# Patient Record
Sex: Male | Born: 1950
Health system: Southern US, Community
[De-identification: ages and names within clinical notes are randomized; demographics above are authoritative.]

## PROBLEM LIST (undated history)

## (undated) DIAGNOSIS — G20 Parkinson's disease: Secondary | ICD-10-CM

## (undated) DIAGNOSIS — Z9889 Other specified postprocedural states: Secondary | ICD-10-CM

## (undated) DIAGNOSIS — M1710 Unilateral primary osteoarthritis, unspecified knee: Secondary | ICD-10-CM

## (undated) DIAGNOSIS — Z87442 Personal history of urinary calculi: Secondary | ICD-10-CM

## (undated) DIAGNOSIS — I499 Cardiac arrhythmia, unspecified: Secondary | ICD-10-CM

## (undated) DIAGNOSIS — G20A1 Parkinson's disease without dyskinesia, without mention of fluctuations: Secondary | ICD-10-CM

## (undated) DIAGNOSIS — J302 Other seasonal allergic rhinitis: Secondary | ICD-10-CM

## (undated) DIAGNOSIS — E7800 Pure hypercholesterolemia, unspecified: Secondary | ICD-10-CM

## (undated) DIAGNOSIS — M1990 Unspecified osteoarthritis, unspecified site: Secondary | ICD-10-CM

## (undated) DIAGNOSIS — I4891 Unspecified atrial fibrillation: Secondary | ICD-10-CM

## (undated) DIAGNOSIS — J189 Pneumonia, unspecified organism: Secondary | ICD-10-CM

## (undated) DIAGNOSIS — R112 Nausea with vomiting, unspecified: Secondary | ICD-10-CM

## (undated) DIAGNOSIS — M5430 Sciatica, unspecified side: Secondary | ICD-10-CM

## (undated) DIAGNOSIS — B070 Plantar wart: Secondary | ICD-10-CM

## (undated) DIAGNOSIS — I82409 Acute embolism and thrombosis of unspecified deep veins of unspecified lower extremity: Secondary | ICD-10-CM

## (undated) DIAGNOSIS — G459 Transient cerebral ischemic attack, unspecified: Secondary | ICD-10-CM

## (undated) DIAGNOSIS — E878 Other disorders of electrolyte and fluid balance, not elsewhere classified: Secondary | ICD-10-CM

## (undated) DIAGNOSIS — N200 Calculus of kidney: Secondary | ICD-10-CM

## (undated) DIAGNOSIS — I2699 Other pulmonary embolism without acute cor pulmonale: Secondary | ICD-10-CM

## (undated) DIAGNOSIS — I639 Cerebral infarction, unspecified: Secondary | ICD-10-CM

## (undated) DIAGNOSIS — M47816 Spondylosis without myelopathy or radiculopathy, lumbar region: Secondary | ICD-10-CM

## (undated) DIAGNOSIS — I10 Essential (primary) hypertension: Secondary | ICD-10-CM

## (undated) DIAGNOSIS — T7840XA Allergy, unspecified, initial encounter: Secondary | ICD-10-CM

## (undated) HISTORY — DX: Unspecified osteoarthritis, unspecified site: M19.90

## (undated) HISTORY — DX: Calculus of kidney: N20.0

## (undated) HISTORY — PX: TONSILLECTOMY: SUR1361

## (undated) HISTORY — PX: KNEE SURGERY: SHX244

## (undated) HISTORY — PX: PAIN PUMP IMPLANTATION: SHX330

## (undated) HISTORY — DX: Plantar wart: B07.0

## (undated) HISTORY — DX: Spondylosis without myelopathy or radiculopathy, lumbar region: M47.816

## (undated) HISTORY — DX: Parkinson's disease: G20

## (undated) HISTORY — DX: Other disorders of electrolyte and fluid balance, not elsewhere classified: E87.8

## (undated) HISTORY — DX: Sciatica, unspecified side: M54.30

## (undated) HISTORY — DX: Essential (primary) hypertension: I10

## (undated) HISTORY — DX: Acute embolism and thrombosis of unspecified deep veins of unspecified lower extremity: I82.409

## (undated) HISTORY — PX: OTHER SURGICAL HISTORY: SHX169

## (undated) HISTORY — PX: EYE SURGERY: SHX253

## (undated) HISTORY — DX: Unspecified atrial fibrillation: I48.91

## (undated) HISTORY — PX: APPENDECTOMY: SHX54

## (undated) HISTORY — DX: Pure hypercholesterolemia, unspecified: E78.00

## (undated) HISTORY — PX: CATARACT EXTRACTION: SUR2

## (undated) HISTORY — DX: Parkinson's disease without dyskinesia, without mention of fluctuations: G20.A1

## (undated) HISTORY — DX: Other seasonal allergic rhinitis: J30.2

## (undated) HISTORY — DX: Transient cerebral ischemic attack, unspecified: G45.9

## (undated) HISTORY — DX: Allergy, unspecified, initial encounter: T78.40XA

## (undated) HISTORY — DX: Other pulmonary embolism without acute cor pulmonale: I26.99

## (undated) HISTORY — DX: Unilateral primary osteoarthritis, unspecified knee: M17.10

---

## 2007-04-15 HISTORY — PX: KNEE ARTHROSCOPY WITH MENISCAL REPAIR: SHX5653

## 2014-05-25 ENCOUNTER — Encounter: Payer: Self-pay | Admitting: Neurology

## 2014-06-06 ENCOUNTER — Ambulatory Visit: Payer: Self-pay | Admitting: Hematology and Oncology

## 2014-06-13 ENCOUNTER — Ambulatory Visit
Admit: 2014-06-13 | Disposition: A | Discharge status: Home / Self Care | Payer: Self-pay | Attending: Hematology and Oncology | Admitting: Hematology and Oncology

## 2014-06-21 ENCOUNTER — Encounter: Payer: Self-pay | Admitting: Neurology

## 2014-06-26 ENCOUNTER — Encounter: Payer: Self-pay | Admitting: Neurology

## 2014-06-26 ENCOUNTER — Ambulatory Visit (INDEPENDENT_AMBULATORY_CARE_PROVIDER_SITE_OTHER): Payer: Medicare PPO | Admitting: Neurology

## 2014-06-26 ENCOUNTER — Telehealth: Payer: Self-pay | Admitting: Neurology

## 2014-06-26 VITALS — BP 128/72 | HR 68 | Ht 70.0 in | Wt 259.0 lb

## 2014-06-26 DIAGNOSIS — G20 Parkinson's disease: Secondary | ICD-10-CM

## 2014-06-26 DIAGNOSIS — R292 Abnormal reflex: Secondary | ICD-10-CM

## 2014-06-26 DIAGNOSIS — M542 Cervicalgia: Secondary | ICD-10-CM

## 2014-06-26 DIAGNOSIS — K5901 Slow transit constipation: Secondary | ICD-10-CM

## 2014-06-26 MED ORDER — ROPINIROLE HCL 2 MG PO TABS: 2.0000 mg | ORAL_TABLET | Freq: Three times a day (TID) | ORAL | Status: DC

## 2014-06-26 MED ORDER — DIAZEPAM 5 MG PO TABS: ORAL_TABLET | ORAL | Status: DC

## 2014-06-26 MED ORDER — CARBIDOPA-LEVODOPA 25-100 MG PO TABS: 1.5000 | ORAL_TABLET | Freq: Three times a day (TID) | ORAL | Status: DC

## 2014-06-26 NOTE — Patient Instructions (Signed)
1.  After you run out of your current supply, we can change your ropinirole to the non extended release and you will take 2 mg three times a day 2.  Increase carbidopa/levodopa 25/100 to 1.5 tablets at 6am/11am/5pm 3.  Stop azilect once you run out of it. 4.  We will get prior authorization for your MRI's (brain and cervical) 5.  Go [place]!!! 6.  Constipation and Parkinson's disease:  Rancho recipe for constipation in Parkinsons Disease:  -1 cup of bran, 2 cups of applesauce in 1 cup of prune juice   Increase fiber intake (Metamucil,vegetables) Regular, moderate exercise can be beneficial.  Avoid medications causing constipation, such as medications like antacids with calcium or magnesium Laxative overuse should be avoided. Stool softeners (Colace) can help with chronic constipation.

## 2014-06-26 NOTE — Progress Notes (Signed)
Thomas [name] was seen today in the movement disorders clinic for neurologic consultation at the request of [name], DO.  The consultation is for the evaluation of PD.  He is accompanied by his wife who supplements the history.  The patient has seen [name] at [facility] and I do have those notes.  The patient was dx with PD in Oct 2013 but first sx's probably started in the summer of 2013 with slowness of movement, R greater than L.  He does state that non-motor sx's like constipation occurred several years prior to this.  He was in [place] at the time.  He was initially started on Azilect in October 2013 and about 6 months later requip was added.  He was started initially at requip XL 2 mg and has worked up to requip XL 6 mg (the last increase was 10/2013).  He noticed a benefit with the requip but the XL is very expensive and asks about that.  He initially had some mental foginess with requip but that is better. Also asks about azilect as hasn't seen a benefit and is costing him $300.  Levodopa was then added by Dr. [name] in 03/2014.  It helped with cramping, rigidity.  He takes it at 4 am (but then goes back to sleep until 6am), 12pm, 8pm (bed at 8:30pm). He exercises faithfully.  He has neck turning to the right and has since the dx.  Some pain.     Specific Other Symptoms:  Tremor: Yes.  , only with pushing/pulling something or with stress Voice: weaker than in the past Sleep: sleeps well  Vivid Dreams:  Yes.    Acting out dreams:  Yes.   Wet Pillows: Yes.   (occasionally) Postural symptoms:  No. (unless gets up quick)  Falls?  No. Bradykinesia symptoms: slow movements; able to get up better now that on levodopa Loss of smell:  yes Loss of taste:  No. Urinary Incontinence: minimal - better with levodopa Difficulty Swallowing:  Yes.   but better now (would choke on saliva) Handwriting, micrographia: Yes.   (R hand dominant) Trouble with ADL's:  No. (shaving is easier now  that on levodopa; wife helps put on belt)  Trouble buttoning clothing: No. Depression:  No. Memory changes:  Yes.   but minor Hallucinations:  No.  visual distortions:rare N/V:  No. Lightheaded:  No.  Syncope: No. Diplopia:  No. Dyskinesia:  No.  Neuroimaging has previously been performed.  It is not available for my review today.  This was done in [place] prior to the diagnosis.  The patient does state that he had a TIA in 2007.  He was working in the yard on a hot day and he had just gone in and he sat down and felt a pain in the right side and tinnitus in the right ear and he had R face droop on the way to the hospital and R arm was "curling" on the way to the hospital.  He states that he was out of the window for TPA but was given IV heparin but was back to normal by 4 am (started at 5:30 pm but was at hospital by 6:30 pm so was unclear why out of TPA window).    PREVIOUS MEDICATIONS: Sinemet, Requip and azilect  ALLERGIES:   Allergies  Allergen Reactions  . Codeine Other (See Comments)    CURRENT MEDICATIONS:  Outpatient Encounter Prescriptions as of 06/26/2014  Medication Sig  . aspirin EC 81  MG tablet Take by mouth.  . carbidopa-levodopa (SINEMET IR) 25-100 MG per tablet Take 1 tablet by mouth 3 (three) times daily.   . cetirizine (ZYRTEC) 10 MG tablet Take 10 mg by mouth daily.   . hydrochlorothiazide (MICROZIDE) 12.5 MG capsule Take 12.5 mg by mouth daily.   Marland Kitchen LEVITRA 20 MG tablet Take 20 mg by mouth as needed.   Marland Kitchen lisinopril (PRINIVIL,ZESTRIL) 20 MG tablet Take 20 mg by mouth daily.   . rasagiline (AZILECT) 1 MG TABS tablet Take 1 mg by mouth daily.   . rivaroxaban (XARELTO) 20 MG TABS tablet Take 20 mg by mouth daily with supper.   . Ropinirole HCl 6 MG TB24 Take 6 mg by mouth at bedtime.   . simvastatin (ZOCOR) 40 MG tablet Take 40 mg by mouth daily.   . [DISCONTINUED] vardenafil (LEVITRA) 5 MG tablet Take by mouth.    PAST MEDICAL HISTORY:   Past Medical  History  Diagnosis Date  . PD (Parkinson's disease)   . Hyperchloremia   . Hypertension   . Plantar wart   . Arthritis of knee   . H/O blood clots   . Seasonal allergies   . TIA (transient ischemic attack)   . Nephrolithiasis     PAST SURGICAL HISTORY:   Past Surgical History  Procedure Laterality Date  . Knee surgery Right   . Appendectomy    . Tonsillectomy      SOCIAL HISTORY:   History   Social History  . Marital Status: Married    Spouse Name: N/A  . Number of Children: N/A  . Years of Education: N/A   Occupational History  . 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 Smoking status: Never Smoker   . Smokeless tobacco: Not on file  . Alcohol Use: No  . Drug Use: No  . Sexual Activity: Not on file   Other Topics Concern  . Not on file   Social History Narrative  . No narrative on file    FAMILY HISTORY:   Family Status  Relation Status Death Age  . Father Deceased     PD  . Mother Deceased     CHF  . Sister Deceased     CHF  . Sister Deceased     breast cancer  . Sister Alive     heart disease    ROS:  A complete 10 system review of systems was obtained and was unremarkable apart from what is mentioned above.  PHYSICAL EXAMINATION:    VITALS:   Filed Vitals:   06/26/14 1006  BP: 128/72  Pulse: 68  Height: 5\' 10"  (1.778 m)  Weight: 259 lb (117.482 kg)    GEN:  The patient appears stated age and is in NAD. HEENT:  Normocephalic, atraumatic.  The mucous membranes are moist. The superficial temporal arteries are without ropiness or tenderness. CV:  RRR Lungs:  CTAB Neck/HEME:  There are no carotid bruits bilaterally.  The neck is slightly turned into sidebending to the right.  Neurological examination:  Orientation: The patient is alert and oriented x3. Fund of knowledge is appropriate.  Recent and remote memory are intact.  Attention and concentration are normal.    Able to name objects and repeat phrases. Cranial  nerves: There is good facial symmetry. Pupils are equal round and reactive to light bilaterally. Fundoscopic exam reveals clear margins bilaterally. Extraocular muscles are intact. The visual fields are full to confrontational testing.  The speech is fluent and clear. Soft palate rises symmetrically and there is no tongue deviation. Hearing is intact to conversational tone. Sensation: Sensation is intact to light and pinprick throughout (facial, trunk, extremities). Vibration is intact at the bilateral big toe. There is no extinction with double simultaneous stimulation. There is no sensory dermatomal level identified. Motor: Strength is 5/5 in the bilateral upper and lower extremities.   Shoulder shrug is equal and symmetric.  There is no pronator drift. Deep tendon reflexes: Deep tendon reflexes are 3/4 at the bilateral biceps, triceps, brachioradialis, 3+ patella and achilles.  There are a few beats of nonsustained ankle clonus.  Plantar responses are downgoing bilaterally.  Movement examination: Tone: There is moderate increased tone in the left upper extremity and mild increased tone in the right upper extremity.   Abnormal movements: None Coordination:  There is decremation with RAM's, seen with finger taps on the right and heel taps/toe taps on the left. Gait and Station: The patient has no difficulty arising out of a deep-seated chair without the use of the hands. The patient's stride length is normal with slightly decreased arm swing.  The patient has a negative pull test.      ASSESSMENT/PLAN:  1.  Idiopathic Parkinson's disease, diagnosed in October 2013 in [place] with nonmotor symptoms that preceded this for several years.  Father had PD as well.  -I will change his Requip XL from 6 mg daily to ropinirole 2 mg 3 times a day because of cost.  -Increased carbidopa/levodopa 25/100-1-1/2 tablets 3 times per day and change the dosing times to 6 AM/11 AM/5 PM.  Risks, benefits, side effects  and alternative therapies were discussed.  The opportunity to ask questions was given and they were answered to the best of my ability.  The patient expressed understanding and willingness to follow the outlined treatment protocols.  -Talked about the importance of safe, cardiovascular exercise.  Spent much greater than 50% of the 80 minute visit in counseling.  Talked about what to expect now as well as in the future.  Talked about local support groups.  He is going to be moving soon, so we decided to hold off on the referral to the neuro rehabilitation center.  -The patient would like to discontinue the Azilect, primarily because it is causing a huge financial strain.  We talked about risks and benefits of this, and I have no objection to him stopping it. 2.  Constipation, associated with Parkinson's disease.  -Patient was given a copy of the rancho recipe. 3.  Hyperreflexia.  -This could be associated with Parkinson's disease and the patient had a history of a prior TIA, but given his neck pain and dystonia (also could be associated with Parkinson's), I think we should go ahead and get an MRI of the cervical spine and brain. 4.  REM behavior disorder  -not on meds.  Monitoring. 5.  F/u in next 3 months, sooner should new neuro issues arise.

## 2014-06-26 NOTE — Progress Notes (Signed)
Note faxed to Dr [name] at [phone] with confirmation received.

## 2014-06-26 NOTE — Telephone Encounter (Signed)
Prescriptions sent to patient's pharmacy.

## 2014-06-26 NOTE — Telephone Encounter (Signed)
Krist, pt's spouse called to give the following info. Pharmacy: 90 day program -[place] # [phone] C/b [phone]

## 2014-06-27 ENCOUNTER — Ambulatory Visit
Admission: RE | Admit: 2014-06-27 | Discharge: 2014-06-27 | Disposition: A | Discharge status: Home / Self Care | Payer: Medicare PPO | Source: Ambulatory Visit | Attending: Neurology | Admitting: Neurology

## 2014-07-14 ENCOUNTER — Ambulatory Visit
Admit: 2014-07-14 | Disposition: A | Discharge status: Home / Self Care | Payer: Self-pay | Attending: Hematology and Oncology | Admitting: Hematology and Oncology

## 2014-07-14 ENCOUNTER — Telehealth: Payer: Self-pay | Admitting: Neurology

## 2014-07-14 NOTE — Telephone Encounter (Signed)
MR cancelled with [facility] per patient's request.

## 2014-07-14 NOTE — Telephone Encounter (Signed)
Steffanie Dunn, pt's spouse called wanting to cancel pt's MRI for 07/17/14. She stated that pt can not afford the cost right now. C/b [phone]

## 2014-07-17 ENCOUNTER — Other Ambulatory Visit: Payer: Medicare PPO

## 2014-07-17 ENCOUNTER — Inpatient Hospital Stay: Admission: RE | Admit: 2014-07-17 | Payer: Medicare PPO | Source: Ambulatory Visit

## 2014-09-20 ENCOUNTER — Other Ambulatory Visit: Payer: Self-pay

## 2014-09-20 DIAGNOSIS — I82409 Acute embolism and thrombosis of unspecified deep veins of unspecified lower extremity: Secondary | ICD-10-CM

## 2014-09-25 ENCOUNTER — Telehealth: Payer: Self-pay

## 2014-09-25 DIAGNOSIS — R972 Elevated prostate specific antigen [PSA]: Secondary | ICD-10-CM

## 2014-09-25 NOTE — Telephone Encounter (Signed)
Patients wife called and wanted to know if he could come in tomorrow for PSA, I notified wife that it was fine for him to come in and have this drawn, I scheduled him for an appointment tomorrow.

## 2014-09-26 ENCOUNTER — Other Ambulatory Visit: Payer: Medicare PPO

## 2014-09-26 DIAGNOSIS — R972 Elevated prostate specific antigen [PSA]: Secondary | ICD-10-CM

## 2014-09-27 ENCOUNTER — Telehealth: Payer: Self-pay | Admitting: Family Medicine

## 2014-09-27 LAB — PSA: PROSTATE SPECIFIC AG, SERUM: 2.3 ng/mL (ref 0.0–4.0)

## 2014-09-27 NOTE — Telephone Encounter (Signed)
Called and gave Thomas [name] the results that his PSA is back to normal.

## 2014-09-29 ENCOUNTER — Inpatient Hospital Stay: Payer: Medicare PPO

## 2014-09-29 ENCOUNTER — Inpatient Hospital Stay: Payer: Medicare PPO | Attending: Hematology and Oncology | Admitting: Hematology and Oncology

## 2014-09-29 ENCOUNTER — Encounter: Payer: Self-pay | Admitting: Hematology and Oncology

## 2014-09-29 VITALS — BP 127/82 | HR 60 | Temp 97.2°F | Resp 16 | Wt 258.4 lb

## 2014-09-29 DIAGNOSIS — G20 Parkinson's disease: Secondary | ICD-10-CM | POA: Diagnosis not present

## 2014-09-29 DIAGNOSIS — Z8673 Personal history of transient ischemic attack (TIA), and cerebral infarction without residual deficits: Secondary | ICD-10-CM | POA: Insufficient documentation

## 2014-09-29 DIAGNOSIS — E780 Pure hypercholesterolemia: Secondary | ICD-10-CM | POA: Insufficient documentation

## 2014-09-29 DIAGNOSIS — I10 Essential (primary) hypertension: Secondary | ICD-10-CM | POA: Insufficient documentation

## 2014-09-29 DIAGNOSIS — I2699 Other pulmonary embolism without acute cor pulmonale: Secondary | ICD-10-CM | POA: Insufficient documentation

## 2014-09-29 DIAGNOSIS — I82402 Acute embolism and thrombosis of unspecified deep veins of left lower extremity: Secondary | ICD-10-CM

## 2014-09-29 DIAGNOSIS — I82409 Acute embolism and thrombosis of unspecified deep veins of unspecified lower extremity: Secondary | ICD-10-CM | POA: Diagnosis not present

## 2014-09-29 DIAGNOSIS — Z7901 Long term (current) use of anticoagulants: Secondary | ICD-10-CM

## 2014-09-30 ENCOUNTER — Encounter: Payer: Self-pay | Admitting: Hematology and Oncology

## 2014-09-30 NOTE — Progress Notes (Signed)
[facility] day:  09/30/2014  Chief Complaint: Thomas [name] is a 64 y.o. male with a distant history of cerebrovascular accident and a recent history of deep venous thrombosis and pulmonary embolism who is seen for 3 month assessment on Xarelto.  HPI: The patient was last seen in the medical oncology clinic on 06/29/2014.  At that time, he was seen for initial assessment by me. He had been on Xarelto since 11/2013. He denied any bruising or bleeding. Symptomatically, he felt well. He denied any weight loss. He had a normal colonoscopy in the past 10 years. PSA was normal in July 2015. Hypercoagulable workup was negative. Exam was unremarkable.  Left lower extremity duplex on 06/23/2014 revealed some partially occlusive thrombus of the left posterior tibial, femoral veins.   Outside labs from 05/19/2014 had revealed elevated d-dimer indicating active clot breakdown.  The patient states he has a new neurologist in [place]. His medications have been adjusted. He now feels better than he has in the past year and a half. He is active and goes to [employer] and does cardio 4 times a week. He moved into a new house.  He has to climb 14 stairs a day.  Past Medical History  Diagnosis Date  . DVT (deep venous thrombosis)   . Parkinson's disease   . Pulmonary embolism   . Hypercholesterolemia   . Hypertension     No past surgical history on file.  Family History  Problem Relation Age of Onset  . Coronary artery disease Mother   . Parkinson's disease Father   . Heart attack Sister   . Deep vein thrombosis Sister     Social History:  reports that he has never smoked. He does not have any smokeless tobacco history on file. His alcohol and drug histories are not on file.  The patient is accompanied by his wife, [name], today.  Allergies:  Allergies  Allergen Reactions  . Codeine     hallucinations   Current Medications: No current outpatient  prescriptions on file.   No current facility-administered medications for this visit.   Review of Systems:  GENERAL:  Feels good.  Much more active.  No fevers, sweats or weight loss. PERFORMANCE STATUS (ECOG):  1-2 HEENT:  No visual changes, runny nose, sore throat, mouth sores or tenderness. Lungs: No shortness of breath or cough.  No hemoptysis. Cardiac:  No chest pain, palpitations, orthopnea, or PND. GI:  No nausea, vomiting, diarrhea, constipation, melena or hematochezia. GU:  No urgency, frequency, dysuria, or hematuria. Musculoskeletal:  Knee problem (chronic).  No back pain.  No muscle tenderness. Extremities:  No pain or swelling. Skin:  Plantar's warst. No rashes or skin changes. Neuro:  No headache, numbness or weakness, balance or coordination issues. Endocrine:  No diabetes, thyroid issues, hot flashes or night sweats. Psych:  No mood changes, depression or anxiety. Pain:  No focal pain. Review of systems:  All other systems reviewed and found to be negative.  Physical Exam: Blood pressure 127/82, pulse 60, temperature 97.2 F (36.2 C), temperature source Tympanic, resp. rate 16, weight 258 lb 6.1 oz (117.2 kg), SpO2 98 %. GENERAL:  Well developed, well nourished, sitting comfortably in the exam room in no acute distress. MENTAL STATUS:  Alert and oriented to person, place and time. HEAD:  Normocephalic, atraumatic, face symmetric, no Cushingoid features. EYES:  Blue eyes.  Pupils equal round and reactive to light and accomodation.  No conjunctivitis or  scleral icterus. ENT:  Oropharynx clear without lesion.  Tongue normal. Mucous membranes moist.  RESPIRATORY:  Clear to auscultation without rales, wheezes or rhonchi. CARDIOVASCULAR:  Regular rate and rhythm without murmur, rub or gallop. ABDOMEN:  Soft, non-tender, with active bowel sounds, and no hepatosplenomegaly.  No masses. SKIN:  No rashes, ulcers or lesions. EXTREMITIES: Chronic left lower extremity edema.  No  skin discoloration or tenderness.  No palpable cords. LYMPH NODES: No palpable cervical, supraclavicular, axillary or inguinal adenopathy  NEUROLOGICAL: Unremarkable. PSYCH:  Appropriate.   Outside Labs:  CBC on 08/22/2014 have lab or revealed a hematocrit of 41, hemoglobin 14.1, MCV 88, platelets 17,000, white count 5600 with an [place] of 3700.  Comprehensive metabolic panel was normal with a creatinine of 1.  PSA was 7.5 (high) with a repeat value of 2.3 on 09/26/2014.  Assessment:  Derrious Bologna is a 64 y.o. male with a history of a cerebral vascular accident in 2007 and a history of deep venous thrombosis and pulmonary embolism on 12/07/2013.  There were no precipitating events to his PE and DVT.  He was placed on Xarelto.  Laboratory testing on 05/19/2014 revealed negative Factor V Leiden, beta-2 glycoprotein, and anticardiolipin antibodies. Protein S activity and antigen, and antithrombin III activity were normal.  Additional testing on 06/14/2014 revealed a normal CBC, CMP, protein C antigen and activity, prothrombin gene mutation, and lupus anticoagulant panel.  D-dimers were elevated indicating active clot breakdown.  Lower extremity duplex on 06/23/2014 revealed persistent partially occlusive thrombus in the left posterior tibial, popliteal, and mid femoral veins.  Chest CT on 06/23/2014 revealed no pulmonary nodules or adenoopathy.  He has been on Xarelto since 11/2013.  He denies any bruising or bleeding.   Symptomatically,  he feels well. He has been more active with a change in his Parkinson's medications.  PSA was elevated on 08/22/2014 then normal on 09/26/2014.  Exam is unremarkable.  Plan: 1. Discuss negative hypercoagulable workup. Discuss initial concern regarding Parkinson's disease and activity level. Patient now appears more active. Discuss plan to repeat ultrasound and D dimers and at that point decide about length of anticoagulation. Discuss if patient develops a clot off of  anticoagulation, he would require life long anticoagulation. 2. Slip for  3. [facility] labs at next visit. 4. Continue Xarelto.  5. Scheduled lower extremity duplex in 3 months 6. Return to clinic in 3 months for M.D. assessment, review of outside labs and recent ultrasound.  Lequita Asal, MD  09/29/2014, 4:10 PM

## 2014-10-02 ENCOUNTER — Encounter: Payer: Self-pay | Admitting: Hematology and Oncology

## 2014-10-02 NOTE — Addendum Note (Signed)
Addended by: Vivia Birmingham on: 10/02/2014 09:04 AM   Modules accepted: Medications

## 2014-10-02 NOTE — Addendum Note (Signed)
Addended by: Vivia Birmingham on: 10/02/2014 09:48 AM   Modules accepted: Medications

## 2014-10-26 ENCOUNTER — Encounter: Payer: Self-pay | Admitting: Neurology

## 2014-10-26 ENCOUNTER — Ambulatory Visit (INDEPENDENT_AMBULATORY_CARE_PROVIDER_SITE_OTHER): Payer: Medicare PPO | Admitting: Neurology

## 2014-10-26 VITALS — BP 110/72 | HR 68 | Ht 70.0 in | Wt 260.0 lb

## 2014-10-26 DIAGNOSIS — G4752 REM sleep behavior disorder: Secondary | ICD-10-CM

## 2014-10-26 DIAGNOSIS — G20 Parkinson's disease: Secondary | ICD-10-CM

## 2014-10-26 NOTE — Progress Notes (Signed)
Thomas [name] was seen today in the movement disorders clinic for neurologic consultation at the request of Park Liter, DO.  The consultation is for the evaluation of PD.  He is accompanied by his wife who supplements the history.  The patient has seen [name] at [facility] and I do have those notes.  The patient was dx with PD in Oct 2013 but first sx's probably started in the summer of 2013 with slowness of movement, R greater than L.  He does state that non-motor sx's like constipation occurred several years prior to this.  He was in [place] at the time.  He was initially started on Azilect in October 2013 and about 6 months later requip was added.  He was started initially at requip XL 2 mg and has worked up to requip XL 6 mg (the last increase was 10/2013).  He noticed a benefit with the requip but the XL is very expensive and asks about that.  He initially had some mental foginess with requip but that is better. Also asks about azilect as hasn't seen a benefit and is costing him $300.  Levodopa was then added by Dr. [name] in 03/2014.  It helped with cramping, rigidity.  He takes it at 4 am (but then goes back to sleep until 6am), 12pm, 8pm (bed at 8:30pm). He exercises faithfully.  He has neck turning to the right and has since the dx.  Some pain.     Specific Other Symptoms:  Tremor: Yes.  , only with pushing/pulling something or with stress Voice: weaker than in the past Sleep: sleeps well  Vivid Dreams:  Yes.    Acting out dreams:  Yes.   Wet Pillows: Yes.   (occasionally) Postural symptoms:  No. (unless gets up quick)  Falls?  No. Bradykinesia symptoms: slow movements; able to get up better now that on levodopa Loss of smell:  yes Loss of taste:  No. Urinary Incontinence: minimal - better with levodopa Difficulty Swallowing:  Yes.   but better now (would choke on saliva) Handwriting, micrographia: Yes.   (R hand dominant) Trouble with ADL's:  No. (shaving is easier now that  on levodopa; wife helps put on belt)  Trouble buttoning clothing: No. Depression:  No. Memory changes:  Yes.   but minor Hallucinations:  No.  visual distortions:rare N/V:  No. Lightheaded:  No.  Syncope: No. Diplopia:  No. Dyskinesia:  No.  Neuroimaging has previously been performed.  It is not available for my review today.  This was done in [place] prior to the diagnosis.  The patient does state that he had a TIA in 2007.  He was working in the yard on a hot day and he had just gone in and he sat down and felt a pain in the right side and tinnitus in the right ear and he had R face droop on the way to the hospital and R arm was "curling" on the way to the hospital.  He states that he was out of the window for TPA but was given IV heparin but was back to normal by 4 am (started at 5:30 pm but was at hospital by 6:30 pm so was unclear why out of TPA window).    10/26/14 update:  The patient returns today for follow-up, accompanied by his wife who supplements the history.  Last visit, changed him from Requip XL, 6 mg daily to ropinirole 2 mg 3 times per day, solely because of cost.  His Azilect was also discontinued because of cost.  His levodopa was increased to carbidopa/levodopa 25/100, 1-1/2 tablets 3 times per day.  I planned to order an MRI of the brain and cervical spine, but this was canceled by the patient because of cost.  Today, the patient states that he feels better overall and his gait is better; he no longer feels "foggy."  He is exercising.  He is doing cardio at [employer] and doing weights and cardio.  No falls.  Occ lightheadedness.  No syncope.  No hallucinations.  Very vivid dreams.  Is talking in sleep.  No hitting wife at night.  Not falling out bed.  "I feel better than I have in years."  PREVIOUS MEDICATIONS: Sinemet, Requip and azilect  ALLERGIES:   Allergies  Allergen Reactions  . Codeine Other (See Comments)    CURRENT MEDICATIONS:  Outpatient Encounter  Prescriptions as of 10/26/2014  Medication Sig  . aspirin EC 81 MG tablet Take by mouth.  . carbidopa-levodopa (SINEMET IR) 25-100 MG per tablet Take 1.5 tablets by mouth 3 (three) times daily.  . hydrochlorothiazide (MICROZIDE) 12.5 MG capsule Take 12.5 mg by mouth daily.   Marland Kitchen LEVITRA 20 MG tablet Take 20 mg by mouth as needed.   Marland Kitchen lisinopril (PRINIVIL,ZESTRIL) 20 MG tablet Take 30 mg by mouth daily.   . rivaroxaban (XARELTO) 20 MG TABS tablet Take 20 mg by mouth daily with supper.   Marland Kitchen rOPINIRole (REQUIP) 2 MG tablet Take 1 tablet (2 mg total) by mouth 3 (three) times daily.  . simvastatin (ZOCOR) 40 MG tablet Take 40 mg by mouth daily.   . [DISCONTINUED] cetirizine (ZYRTEC) 10 MG tablet Take 10 mg by mouth daily.   . [DISCONTINUED] diazepam (VALIUM) 5 MG tablet Take 1 tablet 30 minutes prior to MRI may repeat if needed  . [DISCONTINUED] rasagiline (AZILECT) 1 MG TABS tablet Take 1 mg by mouth daily.    No facility-administered encounter medications on file as of 10/26/2014.    PAST MEDICAL HISTORY:   Past Medical History  Diagnosis Date  . PD (Parkinson's disease)   . Hyperchloremia   . Hypertension   . Plantar wart   . Arthritis of knee   . DVT (deep venous thrombosis)     L leg  . Seasonal allergies   . TIA (transient ischemic attack)   . Nephrolithiasis   . Pulmonary embolism     PAST SURGICAL HISTORY:   Past Surgical History  Procedure Laterality Date  . Knee surgery Right   . Appendectomy    . Tonsillectomy      SOCIAL HISTORY:   History   Social History  . Marital Status: Married    Spouse Name: N/A  . Number of Children: N/A  . Years of Education: N/A   Occupational History  . 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 Smoking status: Never Smoker   . Smokeless tobacco: Not on file  . Alcohol Use: No  . Drug Use: No  . Sexual Activity: Not on file   Other Topics Concern  . Not on file   Social History Narrative    FAMILY HISTORY:     Family Status  Relation Status Death Age  . Father Deceased     PD  . Mother Deceased     CHF  . Sister Deceased     CHF  . Sister Deceased     breast cancer  . Sister  Alive     heart disease    ROS:  A complete 10 system review of systems was obtained and was unremarkable apart from what is mentioned above.  PHYSICAL EXAMINATION:    VITALS:   Filed Vitals:   10/26/14 1051  BP: 110/72  Pulse: 68  Height: 5\' 10"  (1.778 m)  Weight: 260 lb (117.935 kg)    GEN:  The patient appears stated age and is in NAD. HEENT:  Normocephalic, atraumatic.  The mucous membranes are moist. The superficial temporal arteries are without ropiness or tenderness. CV:  RRR Lungs:  CTAB Neck/HEME:  There are no carotid bruits bilaterally.  The neck is slightly turned into sidebending to the right.  Neurological examination:  Orientation: The patient is alert and oriented x3. Fund of knowledge is appropriate.  Recent and remote memory are intact.  Attention and concentration are normal.    Able to name objects and repeat phrases. Cranial nerves: There is good facial symmetry.  Extraocular muscles are intact. The visual fields are full to confrontational testing. The speech is fluent and clear. Soft palate rises symmetrically and there is no tongue deviation. Hearing is intact to conversational tone. Sensation: Sensation is intact to light touch throughout Motor: Strength is 5/5 in the bilateral upper and lower extremities.   Shoulder shrug is equal and symmetric.  There is no pronator drift.   Movement examination: Tone: There is mild increased tone in the LUE and normal on the right Abnormal movements: None Coordination:  There is minimal decremation with RAM's, seen with finger taps on the right and heel taps/toe taps on the left. Gait and Station: The patient has no difficulty arising out of a deep-seated chair without the use of the hands. The patient's stride length is normal with slightly  decreased arm swing.  The patient has a negative pull test.      ASSESSMENT/PLAN:  1.  Idiopathic Parkinson's disease, diagnosed in October 2013 in [place] with nonmotor symptoms that preceded this for several years.  Father had PD as well.  -continue ropinirole 2 mg 3 times a day because of cost.  -continue carbidopa/levodopa 25/100-1-1/2 tablets 3 times per day Risks, benefits, side effects and alternative therapies were discussed.  The opportunity to ask questions was given and they were answered to the best of my ability.  The patient expressed understanding and willingness to follow the outlined treatment protocols.  -Is faithful with exercise and I congratulated him  -off azilect because of cost and doing well 2.  Constipation, associated with Parkinson's disease.  -Patient was given a copy of the rancho recipe. 3.  Hyperreflexia.  -This could be associated with Parkinson's disease and the patient had a history of a prior TIA, but given his neck pain and dystonia (also could be associated with Parkinson's), I wanted to initially do an MRI brain and c-spine.  They cancelled because of cost.  Pt reports that he is doing better than he has in years 4.  REM behavior disorder  -not on meds.  Discussed in detail today.  Still doesn't want meds.  Will let me know if changes mind 5.  F/u in next 3 months, sooner should new neuro issues arise.

## 2014-10-26 NOTE — Patient Instructions (Signed)
Melatonin - between 3-8 mg has been well studied in parkinsons.  Would start with 3 mg if you try it

## 2014-12-19 ENCOUNTER — Telehealth: Payer: Self-pay | Admitting: Neurology

## 2014-12-19 MED ORDER — ROPINIROLE HCL 2 MG PO TABS: 2.0000 mg | ORAL_TABLET | Freq: Three times a day (TID) | ORAL | Status: DC

## 2014-12-19 MED ORDER — CARBIDOPA-LEVODOPA 25-100 MG PO TABS: 1.5000 | ORAL_TABLET | Freq: Three times a day (TID) | ORAL | Status: DC

## 2014-12-19 NOTE — Telephone Encounter (Signed)
Pt called and gave the [facility] # [phone]//[id number] Meds/to be filled/ [facility]?//Call Pt back @ [phone]

## 2014-12-19 NOTE — Telephone Encounter (Signed)
Carbidopa Levodopa 25/100 and Requip refill requested. Per last office note- patient to remain on medication. Refill approved and sent to [facility] as requested.

## 2014-12-26 ENCOUNTER — Encounter: Payer: Self-pay | Admitting: Hematology and Oncology

## 2015-01-02 ENCOUNTER — Ambulatory Visit
Admission: RE | Admit: 2015-01-02 | Discharge: 2015-01-02 | Disposition: A | Discharge status: Home / Self Care | Payer: Medicare PPO | Source: Ambulatory Visit | Attending: Hematology and Oncology | Admitting: Hematology and Oncology

## 2015-01-02 DIAGNOSIS — I82412 Acute embolism and thrombosis of left femoral vein: Secondary | ICD-10-CM | POA: Diagnosis not present

## 2015-01-02 DIAGNOSIS — I82402 Acute embolism and thrombosis of unspecified deep veins of left lower extremity: Secondary | ICD-10-CM

## 2015-01-02 DIAGNOSIS — I82432 Acute embolism and thrombosis of left popliteal vein: Secondary | ICD-10-CM | POA: Diagnosis not present

## 2015-01-05 ENCOUNTER — Inpatient Hospital Stay: Payer: Medicare PPO | Attending: Hematology and Oncology | Admitting: Hematology and Oncology

## 2015-01-05 VITALS — BP 130/82 | HR 67 | Temp 98.8°F | Wt 264.6 lb

## 2015-01-05 DIAGNOSIS — G20 Parkinson's disease: Secondary | ICD-10-CM | POA: Diagnosis not present

## 2015-01-05 DIAGNOSIS — Z7901 Long term (current) use of anticoagulants: Secondary | ICD-10-CM

## 2015-01-05 DIAGNOSIS — Z79899 Other long term (current) drug therapy: Secondary | ICD-10-CM

## 2015-01-05 DIAGNOSIS — I10 Essential (primary) hypertension: Secondary | ICD-10-CM | POA: Diagnosis not present

## 2015-01-05 DIAGNOSIS — E780 Pure hypercholesterolemia: Secondary | ICD-10-CM | POA: Insufficient documentation

## 2015-01-05 DIAGNOSIS — Z87820 Personal history of traumatic brain injury: Secondary | ICD-10-CM

## 2015-01-05 DIAGNOSIS — M1990 Unspecified osteoarthritis, unspecified site: Secondary | ICD-10-CM | POA: Diagnosis not present

## 2015-01-05 DIAGNOSIS — I82412 Acute embolism and thrombosis of left femoral vein: Secondary | ICD-10-CM

## 2015-01-05 DIAGNOSIS — I2699 Other pulmonary embolism without acute cor pulmonale: Secondary | ICD-10-CM

## 2015-01-05 DIAGNOSIS — Z86718 Personal history of other venous thrombosis and embolism: Secondary | ICD-10-CM

## 2015-01-05 NOTE — Progress Notes (Signed)
[facility] day:  01/05/2015  Chief Complaint: Thomas [name] is a 64 y.o. male with a distant history of cerebrovascular accident and a recent history of deep venous thrombosis and pulmonary embolism who is seen for 3 month assessment on Xarelto.  HPI: The patient was last seen in the medical oncology clinic on 09/29/2014.  At that time, he felt well.  He had been more active with a change in his Parkinson's medications.  Exam was unremarkable.  Given his negative work-up and increased mobility, discussions were held regarding repeating a lower extremity duplex and D-dimers.  Left lower extremity duplex on 06/23/2014 revealed some partially occlusive thrombus of the left posterior tibial, femoral veins.   Outside labs from 05/19/2014 had revealed elevated d-dimer indicating active clot breakdown.  Left lower extremity duplex on 01/02/2015 revealed persistent but improving nonocclusive partial thrombus in the femoral and popliteal veins.  Calf veins were patient.  During the interim, he has felt pretty good.  He has had restless sleeping for 4-5 weeks.  He started melatonin.  He denies any lower extremity swelling.He works out at [employer] 45 minutes 3 times a week.  He recently tripped on uneven sidewalk and hit his left side and felt something pop in his rib.  He has felt stiff and sore in his left shoulder and rib.  Past Medical History  Diagnosis Date  . DVT (deep venous thrombosis)   . Parkinson's disease   . Pulmonary embolism   . Hypercholesterolemia   . Hypertension   . Allergy   . Arthritis     Osteoarthritis  . Hypercholesterolemia   . Parkinson's disease     No past surgical history on file.  Family History  Problem Relation Age of Onset  . Coronary artery disease Mother   . Parkinson's disease Father   . Heart attack Sister   . Deep vein thrombosis Sister     Social History:  reports that he has never smoked. He does not  have any smokeless tobacco history on file. His alcohol and drug histories are not on file.  The patient is accompanied by his wife, [name], today.  Allergies:  Allergies  Allergen Reactions  . Codeine     hallucinations   Current Medications: Current Outpatient Prescriptions  Medication Sig Dispense Refill  . aspirin 81 MG tablet Take 81 mg by mouth daily.    . cetirizine (ZYRTEC) 10 MG tablet Take 10 mg by mouth daily.    . hydrochlorothiazide (MICROZIDE) 12.5 MG capsule Take 12.5 mg by mouth daily.    Marland Kitchen lisinopril (PRINIVIL,ZESTRIL) 30 MG tablet Take 30 mg by mouth daily.    . Melatonin 2.5 MG CAPS Take by mouth.    . rasagiline (AZILECT) 0.5 MG TABS tablet Take 1 mg by mouth daily.    . rivaroxaban (XARELTO) 20 MG TABS tablet Take 20 mg by mouth daily with supper.    Marland Kitchen rOPINIRole (REQUIP) 2 MG tablet Take 2 mg by mouth 3 (three) times daily.    . simvastatin (ZOCOR) 40 MG tablet Take 40 mg by mouth daily.    . vardenafil (LEVITRA) 20 MG tablet Take 20 mg by mouth daily as needed for erectile dysfunction.    . carbidopa-levodopa (SINEMET IR) 25-100 MG per tablet     . traMADol (ULTRAM) 50 MG tablet     . ZOSTAVAX 78295 UNT/0.65ML injection      No current facility-administered medications for this visit.  Review of Systems:  GENERAL:  Feels pretty good.  Active.  No fevers, sweats or weight loss. PERFORMANCE STATUS (ECOG):  1-2 HEENT:  No visual changes, runny nose, sore throat, mouth sores or tenderness. Lungs: No shortness of breath or cough.  No hemoptysis. Cardiac:  No chest pain, palpitations, orthopnea, or PND. GI:  No nausea, vomiting, diarrhea, constipation, melena or hematochezia. GU:  No urgency, frequency, dysuria, or hematuria. Musculoskeletal:  Knee problem (chronic).  Left shoulder and rib pain s/p fall.  No muscle tenderness. Extremities:  No pain or swelling. Skin:  Plantar's warts. No rashes or skin changes. Neuro:  No headache, numbness or weakness,  balance or coordination issues. Endocrine:  No diabetes, thyroid issues, hot flashes or night sweats. Psych:  Restless sleep.  No mood changes, depression or anxiety. Pain:  No focal pain. Review of systems:  All other systems reviewed and found to be negative.  Physical Exam: Blood pressure 130/82, pulse 67, temperature 98.8 F (37.1 C), temperature source Tympanic, weight 264 lb 8.8 oz (120 kg). GENERAL:  Well developed, well nourished, sitting comfortably in the exam room in no acute distress. MENTAL STATUS:  Alert and oriented to person, place and time. HEAD:  Pearline Cables short hair.  Normocephalic, atraumatic, face symmetric, no Cushingoid features. EYES:  Glasses.  Blue eyes.  Pupils equal round and reactive to light and accomodation.  No conjunctivitis or scleral icterus. ENT:  Oropharynx clear without lesion.  Tongue normal. Mucous membranes moist.  RESPIRATORY:  Clear to auscultation without rales, wheezes or rhonchi. CARDIOVASCULAR:  Regular rate and rhythm without murmur, rub or gallop. ABDOMEN:  Soft, non-tender, with active bowel sounds, and no hepatosplenomegaly.  No masses. SKIN:  No rashes, ulcers or lesions. EXTREMITIES:  Slight left lower extremity edema.  No skin discoloration or tenderness.  No palpable cords. LYMPH NODES: No palpable cervical, supraclavicular, axillary or inguinal adenopathy  NEUROLOGICAL: Unremarkable. PSYCH:  Appropriate.   Outside Labs:  CBC on 08/22/2014 revealed a hematocrit of 41, hemoglobin 14.1, MCV 88, platelets 17,000, white count 5600 with an ANC of 3700.  Comprehensive metabolic panel was normal with a creatinine of 1.  PSA was 7.5 (high) with a repeat value of 2.3 on 09/26/2014.  CBC on 12/26/2014 revealed a hematocrit of 43.1, hemoglobin 14.6, MCV 89, platelets 195,000, white count 6200 with an ANC of 4100. Comprehensive  metabolic panel was normal with a creatinine of 1.15.  D-dimers were 0.51 (0-.49).  Assessment:  Thomas [name] is a 64 y.o.  male with a history of a cerebral vascular accident in 2007 and a history of deep venous thrombosis and pulmonary embolism on 12/07/2013.  There were no precipitating events to his PE and DVT.  He was placed on Xarelto.  Laboratory testing on 05/19/2014 revealed negative Factor V Leiden, beta-2 glycoprotein, and anticardiolipin antibodies. Protein S activity and antigen, and antithrombin III activity were normal.  Additional testing on 06/14/2014 revealed a normal CBC, CMP, protein C antigen and activity, prothrombin gene mutation, and lupus anticoagulant panel.  D-dimers were elevated indicating active clot breakdown.  Lower extremity duplex on 06/23/2014 revealed persistent partially occlusive thrombus in the left posterior tibial, popliteal, and mid femoral veins.   Left lower extremity duplex on 01/02/2015 revealed persistent but improving nonocclusive partial thrombus in the femoral and popliteal veins.  Calf veins were patient.  Chest CT on 06/23/2014 revealed no pulmonary nodules or adenopathy.  He has been on Xarelto since 11/2013.  He denies any bruising or bleeding.  Symptomatically,  he feels well. He has been active exercising at [employer].  PSA was normal on 09/26/2014.  Exam is unremarkable.  Plan: 1. Review interim labs. 2. [facility] labs at next visit. 3. Continue Xarelto.  4. Return to clinic in 3-4 months for MD assessment and review of outside labs.   Lequita Asal, MD  01/05/2015

## 2015-01-05 NOTE — Progress Notes (Signed)
Patient has no concerns today. 

## 2015-01-08 ENCOUNTER — Other Ambulatory Visit: Payer: Self-pay | Admitting: Family Medicine

## 2015-01-08 MED ORDER — LISINOPRIL 30 MG PO TABS: 30.0000 mg | ORAL_TABLET | Freq: Every day | ORAL | Status: DC

## 2015-01-22 ENCOUNTER — Telehealth: Payer: Self-pay

## 2015-01-22 NOTE — Telephone Encounter (Signed)
Called and LVM for patient to return my call if it was not being handled by Oncology, and I will get this scheduled for him.

## 2015-01-22 NOTE — Telephone Encounter (Signed)
Patient needs a follow up Chest CT without contrast to follow up on a 68mm and 66mm Left lung nodules. Received a message from Programme researcher, broadcasting/film/video

## 2015-01-22 NOTE — Telephone Encounter (Signed)
He had been referred to heme/onc who were going to take care of this- can we make sure he's still seeing them as I don't see anything in epic. Thanks!

## 2015-01-23 ENCOUNTER — Telehealth: Payer: Self-pay

## 2015-01-23 NOTE — Telephone Encounter (Signed)
Patient does not want to have any further testing at this time.

## 2015-02-05 ENCOUNTER — Ambulatory Visit (INDEPENDENT_AMBULATORY_CARE_PROVIDER_SITE_OTHER): Payer: Medicare PPO

## 2015-02-05 DIAGNOSIS — Z23 Encounter for immunization: Secondary | ICD-10-CM

## 2015-02-27 ENCOUNTER — Encounter: Payer: Self-pay | Admitting: Neurology

## 2015-02-27 ENCOUNTER — Ambulatory Visit (INDEPENDENT_AMBULATORY_CARE_PROVIDER_SITE_OTHER): Payer: Medicare PPO | Admitting: Neurology

## 2015-02-27 VITALS — BP 130/90 | HR 76 | Ht 70.0 in | Wt 265.0 lb

## 2015-02-27 DIAGNOSIS — G243 Spasmodic torticollis: Secondary | ICD-10-CM

## 2015-02-27 DIAGNOSIS — Z5181 Encounter for therapeutic drug level monitoring: Secondary | ICD-10-CM

## 2015-02-27 DIAGNOSIS — G20 Parkinson's disease: Secondary | ICD-10-CM | POA: Diagnosis not present

## 2015-02-27 DIAGNOSIS — G4752 REM sleep behavior disorder: Secondary | ICD-10-CM

## 2015-02-27 DIAGNOSIS — R292 Abnormal reflex: Secondary | ICD-10-CM | POA: Diagnosis not present

## 2015-02-27 DIAGNOSIS — G2581 Restless legs syndrome: Secondary | ICD-10-CM

## 2015-02-27 DIAGNOSIS — N528 Other male erectile dysfunction: Secondary | ICD-10-CM

## 2015-02-27 NOTE — Progress Notes (Signed)
Thomas [name] was seen today in the movement disorders clinic for neurologic consultation at the request of Park Liter, DO.  The consultation is for the evaluation of PD.  He is accompanied by his wife who supplements the history.  The patient has seen [name] at [facility] and I do have those notes.  The patient was dx with PD in Oct 2013 but first sx's probably started in the summer of 2013 with slowness of movement, R greater than L.  He does state that non-motor sx's like constipation occurred several years prior to this.  He was in [place] at the time.  He was initially started on Azilect in October 2013 and about 6 months later requip was added.  He was started initially at requip XL 2 mg and has worked up to requip XL 6 mg (the last increase was 10/2013).  He noticed a benefit with the requip but the XL is very expensive and asks about that.  He initially had some mental foginess with requip but that is better. Also asks about azilect as hasn't seen a benefit and is costing him $300.  Levodopa was then added by Dr. [name] in 03/2014.  It helped with cramping, rigidity.  He takes it at 4 am (but then goes back to sleep until 6am), 12pm, 8pm (bed at 8:30pm). He exercises faithfully.  He has neck turning to the right and has since the dx.  Some pain.     Specific Other Symptoms:  Tremor: Yes.  , only with pushing/pulling something or with stress Voice: weaker than in the past Sleep: sleeps well  Vivid Dreams:  Yes.    Acting out dreams:  Yes.   Wet Pillows: Yes.   (occasionally) Postural symptoms:  No. (unless gets up quick)  Falls?  No. Bradykinesia symptoms: slow movements; able to get up better now that on levodopa Loss of smell:  yes Loss of taste:  No. Urinary Incontinence: minimal - better with levodopa Difficulty Swallowing:  Yes.   but better now (would choke on saliva) Handwriting, micrographia: Yes.   (R hand dominant) Trouble with ADL's:  No. (shaving is easier now that  on levodopa; wife helps put on belt)  Trouble buttoning clothing: No. Depression:  No. Memory changes:  Yes.   but minor Hallucinations:  No.  visual distortions:rare N/V:  No. Lightheaded:  No.  Syncope: No. Diplopia:  No. Dyskinesia:  No.  Neuroimaging has previously been performed.  It is not available for my review today.  This was done in [place] prior to the diagnosis.  The patient does state that he had a TIA in 2007.  He was working in the yard on a hot day and he had just gone in and he sat down and felt a pain in the right side and tinnitus in the right ear and he had R face droop on the way to the hospital and R arm was "curling" on the way to the hospital.  He states that he was out of the window for TPA but was given IV heparin but was back to normal by 4 am (started at 5:30 pm but was at hospital by 6:30 pm so was unclear why out of TPA window).    10/26/14 update:  The patient returns today for follow-up, accompanied by his wife who supplements the history.  Last visit, changed him from Requip XL, 6 mg daily to ropinirole 2 mg 3 times per day, solely because of cost.  His Azilect was also discontinued because of cost.  His levodopa was increased to carbidopa/levodopa 25/100, 1-1/2 tablets 3 times per day.  I planned to order an MRI of the brain and cervical spine, but this was canceled by the patient because of cost.  Today, the patient states that he feels better overall and his gait is better; he no longer feels "foggy."  He is exercising.  He is doing cardio at [employer] and doing weights and cardio.  No falls.  Occ lightheadedness.  No syncope.  No hallucinations.  Very vivid dreams.  Is talking in sleep.  No hitting wife at night.  Not falling out bed.  "I feel better than I have in years."  02/27/15 update:  The patient is following up today regarding his Parkinson's disease.  He is currently on carbidopa/levodopa 25/100, 1-1/2 tablets 3 times per day in addition to  ropinirole, 2 mg 3 times per day (5-6am/11-12/and then q hs).  He had one fall on 12/30/14 since last visit.  He tripped over an uneven place in the sidewalk.  He states that he had rib pain but has recovered.  No syncope or lightheadedness.  No hallucinations.  Having very vivid dreams.  He will flail the arms and scream out but has not gotten hurt.  Wife states that it is actually a little better than in the past.  He started on melatonin and that helped his sleep.  He is noting stiffness in the neck and has trouble looking up.  He points to discomfort in the trapezius.  Late in the evening, he feels that his legs will "twitch" and he cannot sit still.  If he moves around, it will go away.    Asks me about changing his levitra to viagra.    PREVIOUS MEDICATIONS: Sinemet, Requip and azilect  ALLERGIES:   Allergies  Allergen Reactions  . Codeine Other (See Comments)    CURRENT MEDICATIONS:  Outpatient Encounter Prescriptions as of 02/27/2015  Medication Sig  . aspirin EC 81 MG tablet Take by mouth.  . carbidopa-levodopa (SINEMET IR) 25-100 MG per tablet Take 1.5 tablets by mouth 3 (three) times daily.  . hydrochlorothiazide (MICROZIDE) 12.5 MG capsule Take 12.5 mg by mouth daily.   Marland Kitchen LEVITRA 20 MG tablet Take 20 mg by mouth as needed.   Marland Kitchen lisinopril (PRINIVIL,ZESTRIL) 20 MG tablet Take 30 mg by mouth daily.   . Melatonin 2.5 MG CAPS Take by mouth at bedtime as needed.  . rivaroxaban (XARELTO) 20 MG TABS tablet Take 20 mg by mouth daily with supper.   Marland Kitchen rOPINIRole (REQUIP) 2 MG tablet Take 1 tablet (2 mg total) by mouth 3 (three) times daily.  . simvastatin (ZOCOR) 40 MG tablet Take 40 mg by mouth daily.    No facility-administered encounter medications on file as of 02/27/2015.    PAST MEDICAL HISTORY:   Past Medical History  Diagnosis Date  . PD (Parkinson's disease) ([place])   . Hyperchloremia   . Hypertension   . Plantar wart   . Arthritis of knee   . DVT (deep venous thrombosis)  ([facility])     L leg  . Seasonal allergies   . TIA (transient ischemic attack)   . Nephrolithiasis   . Pulmonary embolism ([place])     PAST SURGICAL HISTORY:   Past Surgical History  Procedure Laterality Date  . Knee surgery Right   . Appendectomy    . Tonsillectomy      SOCIAL HISTORY:  Social History   Social History  . Marital Status: Married    Spouse Name: N/A  . Number of Children: N/A  . Years of Education: N/A   Occupational History  . 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 Smoking status: Never Smoker   . Smokeless tobacco: Not on file  . Alcohol Use: No  . Drug Use: No  . Sexual Activity: Not on file   Other Topics Concern  . Not on file   Social History Narrative    FAMILY HISTORY:   Family Status  Relation Status Death Age  . Father Deceased     PD  . Mother Deceased     CHF  . Sister Deceased     CHF  . Sister Deceased     breast cancer  . Sister Alive     heart disease    ROS:  A complete 10 system review of systems was obtained and was unremarkable apart from what is mentioned above.  PHYSICAL EXAMINATION:    VITALS:   Filed Vitals:   02/27/15 1049  BP: 130/90  Pulse: 76  Height: 5\' 10"  (1.778 m)  Weight: 265 lb (120.203 kg)    GEN:  The patient appears stated age and is in NAD. HEENT:  Normocephalic, atraumatic.  The mucous membranes are moist. The superficial temporal arteries are without ropiness or tenderness. CV:  RRR Lungs:  CTAB Neck/HEME:  There are no carotid bruits bilaterally.  The neck is slightly turned into sidebending to the right.  Neurological examination:  Orientation: The patient is alert and oriented x3. Fund of knowledge is appropriate.  Recent and remote memory are intact.  Attention and concentration are normal.    Able to name objects and repeat phrases. Cranial nerves: There is good facial symmetry.  Extraocular muscles are intact. The visual fields are full to confrontational testing. The  speech is fluent and clear. Soft palate rises symmetrically and there is no tongue deviation. Hearing is intact to conversational tone. Sensation: Sensation is intact to light touch throughout Motor: Strength is 5/5 in the bilateral upper and lower extremities.   Shoulder shrug is equal and symmetric.  There is no pronator drift. DTR's:  3/4 at bilateral biceps, triceps, brachioradialis, patella (with cross adductor reflexes) and achilles.   Movement examination: Tone: There is mild increased tone in the bilateral UE Abnormal movements: None Coordination:  There is no decremation today with RAMs Gait and Station: The patient has no difficulty arising out of a deep-seated chair without the use of the hands. The patient's stride length is normal with slightly decreased arm swing bilaterally.  The patient has a negative pull test.        ASSESSMENT/PLAN:  1.  Idiopathic Parkinson's disease, diagnosed in October 2013 in [place] with nonmotor symptoms that preceded this for several years.  Father had PD as well.    -continue ropinirole 2 mg 3 times a day because of cost.  Asked him to move last dose that he is taking at bedtime to dinnertime as he is getting RLS in the evening.  -continue carbidopa/levodopa 25/100-1-1/2 tablets 3 times per day Risks, benefits, side effects and alternative therapies were discussed.  The opportunity to ask questions was given and they were answered to the best of my ability.  The patient expressed understanding and willingness to follow the outlined treatment protocols.  -Is faithful with exercise and I congratulated him  -off azilect because of  cost and doing well 2.  Constipation, associated with Parkinson's disease.  -Patient was given a copy of the rancho recipe. 3.  Hyperreflexia with neck discomfort  -This could be associated with Parkinson's disease and the patient had a history of a prior TIA, but given his neck pain and dystonia (also could be  associated with Parkinson's), I wanted to do an MRI brain and c-spine.  I talked with him about this again today.  He is very hyperreflexic.  He just states that it is cost prohibitive and he doesn't want it done.  I told him to let me know if he changes his mind. 4.  REM behavior disorder  -not on meds.  Discussed in detail today.  Still doesn't want meds.  Will let me know if changes mind 5.  ED  -told to talk with PCP re: medication changes that he is requesting 6.  F/u in next 3 months, sooner should new neuro issues arise.  In meantime, I would like to get cbc, chem and iron/ferritin.  I printed him the order as he wanted to have it done at PCP office with physical.

## 2015-03-01 ENCOUNTER — Encounter: Payer: Self-pay | Admitting: Family Medicine

## 2015-03-01 ENCOUNTER — Ambulatory Visit (INDEPENDENT_AMBULATORY_CARE_PROVIDER_SITE_OTHER): Payer: Medicare PPO | Admitting: Family Medicine

## 2015-03-01 VITALS — BP 126/76 | HR 74 | Temp 98.1°F | Ht 68.4 in | Wt 263.0 lb

## 2015-03-01 DIAGNOSIS — I2699 Other pulmonary embolism without acute cor pulmonale: Secondary | ICD-10-CM | POA: Insufficient documentation

## 2015-03-01 DIAGNOSIS — G20 Parkinson's disease: Secondary | ICD-10-CM | POA: Diagnosis not present

## 2015-03-01 DIAGNOSIS — N529 Male erectile dysfunction, unspecified: Secondary | ICD-10-CM | POA: Insufficient documentation

## 2015-03-01 DIAGNOSIS — N521 Erectile dysfunction due to diseases classified elsewhere: Secondary | ICD-10-CM | POA: Diagnosis not present

## 2015-03-01 DIAGNOSIS — Z5181 Encounter for therapeutic drug level monitoring: Secondary | ICD-10-CM

## 2015-03-01 DIAGNOSIS — I10 Essential (primary) hypertension: Secondary | ICD-10-CM | POA: Diagnosis not present

## 2015-03-01 DIAGNOSIS — I82409 Acute embolism and thrombosis of unspecified deep veins of unspecified lower extremity: Secondary | ICD-10-CM | POA: Insufficient documentation

## 2015-03-01 DIAGNOSIS — E7800 Pure hypercholesterolemia, unspecified: Secondary | ICD-10-CM | POA: Diagnosis not present

## 2015-03-01 HISTORY — DX: Male erectile dysfunction, unspecified: N52.9

## 2015-03-01 LAB — CBC WITH DIFFERENTIAL/PLATELET
BASOS: 1 %
Basophils Absolute: 0 10*3/uL (ref 0.0–0.2)
EOS (ABSOLUTE): 0.1 10*3/uL (ref 0.0–0.4)
EOS: 2 %
HEMATOCRIT: 40.8 % (ref 37.5–51.0)
Hemoglobin: 13.8 g/dL (ref 12.6–17.7)
IMMATURE GRANS (ABS): 0 10*3/uL (ref 0.0–0.1)
IMMATURE GRANULOCYTES: 0 %
LYMPHS: 22 %
Lymphocytes Absolute: 1.5 10*3/uL (ref 0.7–3.1)
MCH: 29.7 pg (ref 26.6–33.0)
MCHC: 33.8 g/dL (ref 31.5–35.7)
MCV: 88 fL (ref 79–97)
Monocytes Absolute: 0.4 10*3/uL (ref 0.1–0.9)
Monocytes: 7 %
NEUTROS PCT: 68 %
Neutrophils Absolute: 4.7 10*3/uL (ref 1.4–7.0)
PLATELETS: 177 10*3/uL (ref 150–379)
RBC: 4.64 x10E6/uL (ref 4.14–5.80)
RDW: 12.7 % (ref 12.3–15.4)
WBC: 6.8 10*3/uL (ref 3.4–10.8)

## 2015-03-01 LAB — LIPID PANEL PICCOLO, WAIVED
CHOL/HDL RATIO PICCOLO,WAIVE: 2.9 mg/dL
CHOLESTEROL PICCOLO, WAIVED: 136 mg/dL (ref <200)
HDL CHOL PICCOLO, WAIVED: 47 mg/dL — AB (ref >59)
LDL Chol Calc Piccolo Waived: 75 mg/dL (ref <100)
TRIGLYCERIDES PICCOLO,WAIVED: 74 mg/dL (ref <150)
VLDL Chol Calc Piccolo,Waive: 15 mg/dL (ref <30)

## 2015-03-01 LAB — MICROALBUMIN, URINE WAIVED
Creatinine, Urine Waived: 50 mg/dL (ref 10–300)
Microalb, Ur Waived: 10 mg/L (ref 0–19)
Microalb/Creat Ratio: <30 mg/g (ref <30)

## 2015-03-01 MED ORDER — HYDROCHLOROTHIAZIDE 12.5 MG PO CAPS: 12.5000 mg | ORAL_CAPSULE | Freq: Every day | ORAL | Status: DC

## 2015-03-01 MED ORDER — LISINOPRIL 30 MG PO TABS: 30.0000 mg | ORAL_TABLET | Freq: Every day | ORAL | Status: DC

## 2015-03-01 MED ORDER — SIMVASTATIN 40 MG PO TABS: 40.0000 mg | ORAL_TABLET | Freq: Every day | ORAL | Status: DC

## 2015-03-01 MED ORDER — SILDENAFIL CITRATE 100 MG PO TABS: 50.0000 mg | ORAL_TABLET | Freq: Every day | ORAL | Status: DC | PRN

## 2015-03-01 NOTE — Progress Notes (Signed)
BP 126/76 mmHg  Pulse 74  Temp(Src) 98.1 F (36.7 C)  Ht 5' 8.4" (1.737 m)  Wt 263 lb (119.296 kg)  BMI 39.54 kg/m2  SpO2 97%   Subjective:    Patient ID: Thomas [name], male    DOB: December 24, 1950, 64 y.o.   MRN: [id number]  HPI: Thomas [name] is a 64 y.o. male  Chief Complaint  Patient presents with  . Hypertension  . Hyperlipidemia   HYPERTENSION / HYPERLIPIDEMIA Satisfied with current treatment? yes Duration of hypertension: chronic BP monitoring frequency: not checking BP medication side effects: no Duration of hyperlipidemia: chronic Cholesterol medication side effects: no Cholesterol supplements: none Medication compliance: excellent compliance Aspirin: no Recent stressors: yes Recurrent headaches: no Visual changes: no Palpitations: no Dyspnea: no Chest pain: no Lower extremity edema: no Dizzy/lightheaded: no  Viagra worked well. Likes it better than the levitra. Would like a refill on it. Otherwise feeling well with no other concerns or complaints at this time.   Relevant past medical, surgical, family and social history reviewed and updated as indicated. Interim medical history since our last visit reviewed. Allergies and medications reviewed and updated.  Review of Systems  Constitutional: Negative.   Respiratory: Negative.   Cardiovascular: Negative.   Gastrointestinal: Negative.   Psychiatric/Behavioral: Negative.     Per HPI unless specifically indicated above     Objective:    BP 126/76 mmHg  Pulse 74  Temp(Src) 98.1 F (36.7 C)  Ht 5' 8.4" (1.737 m)  Wt 263 lb (119.296 kg)  BMI 39.54 kg/m2  SpO2 97%  Wt Readings from Last 3 Encounters:  03/01/15 263 lb (119.296 kg)  01/05/15 264 lb 8.8 oz (120 kg)  09/29/14 258 lb 6.1 oz (117.2 kg)    Physical Exam  Constitutional: He is oriented to person, place, and time. He appears well-developed and well-nourished. No distress.  HENT:  Head: Normocephalic and atraumatic.  Right Ear: Hearing  normal.  Left Ear: Hearing normal.  Nose: Nose normal.  Eyes: Conjunctivae and lids are normal. Right eye exhibits no discharge. Left eye exhibits no discharge. No scleral icterus.  Cardiovascular: Normal rate, regular rhythm and intact distal pulses.  Exam reveals no gallop and no friction rub.   No murmur heard. Pulmonary/Chest: Effort normal and breath sounds normal. No respiratory distress. He has no wheezes. He has no rales. He exhibits no tenderness.  Musculoskeletal: Normal range of motion.  Neurological: He is alert and oriented to person, place, and time.  Skin: Skin is warm, dry and intact. No rash noted. No erythema. No pallor.  Psychiatric: He has a normal mood and affect. His speech is normal and behavior is normal. Judgment and thought content normal. Cognition and memory are normal.  Nursing note and vitals reviewed.   No results found for this or any previous visit.    Assessment & Plan:   Problem List Items Addressed This Visit      Cardiovascular and Mediastinum   Hypertension    Under good control on recheck. Continue current regimen. Continue to monitor. Refills sent to his pharmacy. CMP and microalbumin checked today. Recheck 6 months.       Relevant Medications   sildenafil (VIAGRA) 100 MG tablet   hydrochlorothiazide (MICROZIDE) 12.5 MG capsule   lisinopril (PRINIVIL,ZESTRIL) 30 MG tablet   simvastatin (ZOCOR) 40 MG tablet   Other Relevant Orders   Comprehensive metabolic panel   Microalbumin, Urine Waived     Nervous and Auditory   Parkinson's disease ([place])  Needs labs for neurology checked. Ordered today. Results to be forwarded to them.      Relevant Orders   CBC with Differential/Platelet   Ferritin     Genitourinary   Erectile dysfunction    Doing better with the viagra. Rx given today. He will let us know if there are any issues.         Other   Hypercholesterolemia - Primary    Under good control on recheck. Continue current regimen.  Continue to monitor. Refills sent to his pharmacy. CMP and lipid panel checked today. Lipid panel looks great. Continue diet and exercise. Watch weight creeping up. Recheck 6 months.       Relevant Medications   sildenafil (VIAGRA) 100 MG tablet   hydrochlorothiazide (MICROZIDE) 12.5 MG capsule   lisinopril (PRINIVIL,ZESTRIL) 30 MG tablet   simvastatin (ZOCOR) 40 MG tablet   Other Relevant Orders   Comprehensive metabolic panel   Lipid Panel [place], Waived    Other Visit Diagnoses    Encounter for therapeutic drug monitoring        Relevant Orders    CBC with Differential/Platelet    Ferritin        Follow up plan: Return in about 6 months (around 08/29/2015) for Medicare physical.

## 2015-03-01 NOTE — Assessment & Plan Note (Signed)
Under good control on recheck. Continue current regimen. Continue to monitor. Refills sent to his pharmacy. CMP and lipid panel checked today. Lipid panel looks great. Continue diet and exercise. Watch weight creeping up. Recheck 6 months.

## 2015-03-01 NOTE — Assessment & Plan Note (Signed)
Needs labs for neurology checked. Ordered today. Results to be forwarded to them.

## 2015-03-01 NOTE — Assessment & Plan Note (Signed)
Doing better with the viagra. Rx given today. He will let us know if there are any issues.

## 2015-03-01 NOTE — Assessment & Plan Note (Signed)
Under good control on recheck. Continue current regimen. Continue to monitor. Refills sent to his pharmacy. CMP and microalbumin checked today. Recheck 6 months.

## 2015-03-02 ENCOUNTER — Encounter: Payer: Self-pay | Admitting: Family Medicine

## 2015-03-02 LAB — COMPREHENSIVE METABOLIC PANEL
A/G RATIO: 1.6 (ref 1.1–2.5)
ALBUMIN: 4.2 g/dL (ref 3.6–4.8)
ALK PHOS: 83 IU/L (ref 39–117)
ALT: 8 IU/L (ref 0–44)
AST: 13 IU/L (ref 0–40)
BUN / CREAT RATIO: 17 (ref 10–22)
BUN: 18 mg/dL (ref 8–27)
Bilirubin Total: 0.5 mg/dL (ref 0.0–1.2)
CO2: 24 mmol/L (ref 18–29)
Calcium: 9.5 mg/dL (ref 8.6–10.2)
Chloride: 101 mmol/L (ref 97–106)
Creatinine, Ser: 1.07 mg/dL (ref 0.76–1.27)
GFR calc Af Amer: 84 mL/min/{1.73_m2} (ref >59)
GFR calc non Af Amer: 72 mL/min/{1.73_m2} (ref >59)
Globulin, Total: 2.6 g/dL (ref 1.5–4.5)
Glucose: 96 mg/dL (ref 65–99)
POTASSIUM: 4.5 mmol/L (ref 3.5–5.2)
Sodium: 141 mmol/L (ref 136–144)
Total Protein: 6.8 g/dL (ref 6.0–8.5)

## 2015-03-02 LAB — FERRITIN: Ferritin: 120 ng/mL (ref 30–400)

## 2015-03-05 ENCOUNTER — Telehealth: Payer: Self-pay | Admitting: Neurology

## 2015-03-05 NOTE — Telephone Encounter (Signed)
PT called and wants to get an MRI done before the end of the year/Dawn CB# [phone]

## 2015-03-05 NOTE — Telephone Encounter (Signed)
This is not a patient in our practice. Can you make sure this wasn't supposed to be documented on another patient?

## 2015-03-12 ENCOUNTER — Telehealth: Payer: Self-pay | Admitting: Neurology

## 2015-03-12 DIAGNOSIS — M542 Cervicalgia: Secondary | ICD-10-CM

## 2015-03-12 DIAGNOSIS — R292 Abnormal reflex: Secondary | ICD-10-CM

## 2015-03-12 DIAGNOSIS — G20 Parkinson's disease: Secondary | ICD-10-CM

## 2015-03-12 NOTE — Telephone Encounter (Signed)
We have scheduled you at [facility] for your OPEN MRI on 03/16/2015 at 10:30. Please arrive 30 minutes prior and go to [facility]. If you need to reschedule for any reason please call [phone]. Valium sent to pharmacy.   Patient made aware of above information.

## 2015-03-12 NOTE — Telephone Encounter (Signed)
PT needs to have a request for an MRI to be done before the end of year/Dawn CB# [phone]

## 2015-03-12 NOTE — Telephone Encounter (Signed)
Without is probably ok

## 2015-03-12 NOTE — Telephone Encounter (Signed)
Called and patient and he has decided he would like to proceed with MR Brain and C-Spine before the end of the year. Please advise if these are with or without contrast.   He needs the open machine and Valium sent to the pharmacy. He can not have this done on 12/7, 12/13, 12/16, or 12/20.

## 2015-03-15 ENCOUNTER — Telehealth: Payer: Self-pay | Admitting: Neurology

## 2015-03-15 ENCOUNTER — Encounter: Payer: Self-pay | Admitting: Neurology

## 2015-03-15 NOTE — Telephone Encounter (Signed)
Left message on machine for patient's wife to call back.   

## 2015-03-15 NOTE — Telephone Encounter (Signed)
Pt/wife called to check if the [facility] will approve for MRI on tomorrow?//call back @ [phone]

## 2015-03-19 ENCOUNTER — Telehealth: Payer: Self-pay | Admitting: Neurology

## 2015-03-19 NOTE — Telephone Encounter (Signed)
Left message on machine making patient aware of results and to call with any questions.

## 2015-03-19 NOTE — Telephone Encounter (Signed)
VM-PT left message to call and it is OK to leave message and results on voicemail/Dawn CB# [phone]

## 2015-03-19 NOTE — Telephone Encounter (Signed)
Left message on machine for patient to call back.

## 2015-03-19 NOTE — Telephone Encounter (Signed)
Reviewed MRI brain, few rare T2 hyperintensities, no gad given Reviewed MRI cervical spine - C5-6 disc protrusion with  Cervical stenosis and left C6 stenosis but no evidence of cord signal change  Jade, you can let pt know that MRI brain looks okay.  Is disc/osteophyte complex at C5-6 producing some stenosis but [place] doesn't look harmed.  I know he c/o no particular sx's but our threshold will be low if he has neck or arm pain to refer to neurosx.

## 2015-03-22 ENCOUNTER — Other Ambulatory Visit: Payer: Self-pay | Admitting: Family Medicine

## 2015-04-29 ENCOUNTER — Encounter: Payer: Self-pay | Admitting: Hematology and Oncology

## 2015-05-04 ENCOUNTER — Inpatient Hospital Stay: Payer: Medicare PPO | Attending: Hematology and Oncology | Admitting: Hematology and Oncology

## 2015-05-04 DIAGNOSIS — I10 Essential (primary) hypertension: Secondary | ICD-10-CM | POA: Diagnosis not present

## 2015-05-04 DIAGNOSIS — Z87442 Personal history of urinary calculi: Secondary | ICD-10-CM | POA: Insufficient documentation

## 2015-05-04 DIAGNOSIS — Z7982 Long term (current) use of aspirin: Secondary | ICD-10-CM | POA: Diagnosis not present

## 2015-05-04 DIAGNOSIS — I2782 Chronic pulmonary embolism: Secondary | ICD-10-CM

## 2015-05-04 DIAGNOSIS — Z8673 Personal history of transient ischemic attack (TIA), and cerebral infarction without residual deficits: Secondary | ICD-10-CM | POA: Insufficient documentation

## 2015-05-04 DIAGNOSIS — E7800 Pure hypercholesterolemia, unspecified: Secondary | ICD-10-CM | POA: Insufficient documentation

## 2015-05-04 DIAGNOSIS — Z7901 Long term (current) use of anticoagulants: Secondary | ICD-10-CM | POA: Diagnosis not present

## 2015-05-04 DIAGNOSIS — Z86711 Personal history of pulmonary embolism: Secondary | ICD-10-CM | POA: Diagnosis not present

## 2015-05-04 DIAGNOSIS — G20 Parkinson's disease: Secondary | ICD-10-CM | POA: Diagnosis not present

## 2015-05-04 DIAGNOSIS — Z86718 Personal history of other venous thrombosis and embolism: Secondary | ICD-10-CM | POA: Diagnosis not present

## 2015-05-04 DIAGNOSIS — Z79899 Other long term (current) drug therapy: Secondary | ICD-10-CM | POA: Insufficient documentation

## 2015-05-04 DIAGNOSIS — I82402 Acute embolism and thrombosis of unspecified deep veins of left lower extremity: Secondary | ICD-10-CM

## 2015-05-04 NOTE — Progress Notes (Signed)
[facility] day:  05/04/2015  Chief Complaint: Thomas [name] is a 65 y.o. male with a distant history of cerebrovascular accident and a recent history of deep venous thrombosis and pulmonary embolism who is seen for 4 month assessment on Xarelto.  HPI: The patient was last seen in the medical oncology clinic on 01/05/2015.  At that time, was seen for 3 month assessment.  He felt pretty good.  He noted restless sleeping for 4-5 weeks.  He started melatonin.  He denied any lower extremity swelling.  He was working out at [employer] 45 minutes 3 times a week.   During the interim, he has struggled secondary to plantar warts and their treatment.  He has pain in his feet limiting his ability to exercise.  He can't go to [employer].  He is lifting weights at home.  His decreased activity is affecting his Parkinson's.  He notes that his lower back is bothering him.  He denies any bruising or bleeding.  Past Medical History  Diagnosis Date  . PD (Parkinson's disease) ([place])   . Hyperchloremia   . Plantar wart   . Arthritis of knee   . DVT (deep venous thrombosis) ([facility])     L leg  . Seasonal allergies   . TIA (transient ischemic attack)   . Nephrolithiasis   . DVT (deep venous thrombosis) ([place])   . Parkinson's disease ([place])   . Pulmonary embolism ([place])   . Allergy   . Arthritis     Osteoarthritis  . Parkinson's disease ([place])   . Hypercholesterolemia   . Hypercholesterolemia   . Hypertension     Past Surgical History  Procedure Laterality Date  . Knee surgery Right   . Tonsillectomy    . Appendectomy    . T&a    . Knee arthroscopy with meniscal repair Right 2009    Family History  Problem Relation Age of Onset  . Coronary artery disease Mother   . Parkinson's disease Father   . Heart attack Sister   . Deep vein thrombosis Sister     Social History:  reports that he has never smoked. He has never used smokeless tobacco. He reports  that he does not drink alcohol or use illicit drugs.  The patient is accompanied by his wife, [name], today.  Allergies:  Allergies  Allergen Reactions  . Codeine Other (See Comments)  . Codeine     hallucinations   Current Medications: Current Outpatient Prescriptions  Medication Sig Dispense Refill  . aspirin 81 MG tablet Take 81 mg by mouth daily.    . carbidopa-levodopa (SINEMET IR) 25-100 MG per tablet Take 1.5 tablets by mouth 3 (three) times daily. 405 tablet 1  . cetirizine (ZYRTEC) 10 MG tablet Take 10 mg by mouth daily.    . hydrochlorothiazide (MICROZIDE) 12.5 MG capsule Take 1 capsule (12.5 mg total) by mouth daily. 90 capsule 1  . LEVITRA 20 MG tablet Take 20 mg by mouth as needed.   11  . lisinopril (PRINIVIL,ZESTRIL) 30 MG tablet Take 1 tablet (30 mg total) by mouth daily. 90 tablet 1  . Melatonin 2.5 MG CAPS Take by mouth.    Marland Kitchen rOPINIRole (REQUIP) 2 MG tablet Take 1 tablet (2 mg total) by mouth 3 (three) times daily. 270 tablet 1  . sildenafil (VIAGRA) 100 MG tablet Take 0.5-1 tablets (50-100 mg total) by mouth daily as needed for erectile dysfunction. 5 tablet 11  . simvastatin (  ZOCOR) 40 MG tablet Take 1 tablet (40 mg total) by mouth daily. 90 tablet 1  . traMADol (ULTRAM) 50 MG tablet     . XARELTO 20 MG TABS tablet TAKE 1 TABLET EVERY DAY 90 tablet 1  . Melatonin 2.5 MG CAPS Take by mouth at bedtime as needed.     No current facility-administered medications for this visit.   Review of Systems:  GENERAL:  Feels "ok".  Less active.  Sluggish.  No fevers, sweats or weight loss. PERFORMANCE STATUS (ECOG):  1-2 HEENT:  No visual changes, runny nose, sore throat, mouth sores or tenderness. Lungs: No shortness of breath or cough.  No hemoptysis. Cardiac:  No chest pain, palpitations, orthopnea, or PND. GI:  No nausea, vomiting, diarrhea, constipation, melena or hematochezia. GU:  No urgency, frequency, dysuria, or hematuria. Musculoskeletal:  Knee problem (chronic).  Low back discomfort.  No muscle tenderness. Extremities:  No pain or swelling. Skin:  Plantar's warts s/p tretment. No rashes or skin changes. Neuro:  Parkinson's.  No headache, numbness or weakness, balance or coordination issues. Endocrine:  No diabetes, thyroid issues, hot flashes or night sweats. Psych:  Restless sleep.  No mood changes, depression or anxiety. Pain:  No focal pain. Review of systems:  All other systems reviewed and found to be negative.  Physical Exam: Blood pressure 107/71, pulse 74, temperature 97.4 F (36.3 C), temperature source Tympanic, resp. rate 18, height 5' 8.4" (1.737 m), weight 263 lb 14.3 oz (119.7 kg). GENERAL:  Well developed, well nourished, sitting comfortably in the exam room in no acute distress. MENTAL STATUS:  Alert and oriented to person, place and time. HEAD:  Pearline Cables short hair.  Normocephalic, atraumatic, face symmetric, no Cushingoid features. EYES:  Glasses.  Blue eyes.  Pupils equal round and reactive to light and accomodation.  No conjunctivitis or scleral icterus. ENT:  Oropharynx clear without lesion.  Tongue normal. Mucous membranes moist.  RESPIRATORY:  Clear to auscultation without rales, wheezes or rhonchi. CARDIOVASCULAR:  Regular rate and rhythm without murmur, rub or gallop. ABDOMEN:  Soft, non-tender, with active bowel sounds, and no hepatosplenomegaly.  No masses. SKIN:  No rashes, ulcers or lesions. EXTREMITIES:  Slight left lower extremity edema.  No skin discoloration or tenderness.  No palpable cords. LYMPH NODES: No palpable cervical, supraclavicular, axillary or inguinal adenopathy  NEUROLOGICAL: Unremarkable. PSYCH:  Appropriate.   Outside Labs:  CBC on 12/26/2014 revealed a hematocrit of 43.1, hemoglobin 14.6, MCV 89, platelets 195,000, white count 6200 with an ANC of 4100.  Comprehensive metabolic panel was normal with a creatinine of 1.15.  PSA was 7.5 (high) with a repeat value of 2.3 on 09/26/2014.  CBC on 03/01/2015  revealed a hematocrit of 40.8, hemoglobin 13.8, MCV 88, platelets 177,000, white count 6800 with an ANC of 4700. Comprehensive  metabolic panel was normal with a creatinine of 1.07.  Assessment:  Salbador Shehan is a 65 y.o. male with a history of a cerebral vascular accident in 2007 and a history of deep venous thrombosis and pulmonary embolism on 12/07/2013.  There were no precipitating events to his PE and DVT.  He was placed on Xarelto.  Laboratory testing on 05/19/2014 revealed negative Factor V Leiden, beta-2 glycoprotein, and anticardiolipin antibodies. Protein S activity and antigen, and antithrombin III activity were normal.  Additional testing on 06/14/2014 revealed a normal CBC, CMP, protein C antigen and activity, prothrombin gene mutation, and lupus anticoagulant panel.  D-dimers were elevated indicating active clot breakdown.  Lower extremity  duplex on 06/23/2014 revealed persistent partially occlusive thrombus in the left posterior tibial, popliteal, and mid femoral veins.   Left lower extremity duplex on 01/02/2015 revealed persistent but improving nonocclusive partial thrombus in the femoral and popliteal veins.  Calf veins were patient.  Chest CT on 06/23/2014 revealed no pulmonary nodules or adenopathy.  PSA was normal on 09/26/2014.    He has been on Xarelto since 11/2013.  He denies any bruising or bleeding.   Symptomatically, he is struggling with his Parkinson's secondary to decreased exercise post plantar wart procedure.  He has been unable to exercise at [employer].  Exam is unremarkable.  Plan: 1. Review labs from 03/01/2015. 2. [facility] slip. 3. Continue Xarelto.  4. Return to clinic in 6 months for MD assessment and review of outside labs.   Lequita Asal, MD  05/04/2015

## 2015-05-05 ENCOUNTER — Encounter: Payer: Self-pay | Admitting: Hematology and Oncology

## 2015-05-16 ENCOUNTER — Telehealth: Payer: Self-pay | Admitting: Family Medicine

## 2015-05-16 NOTE — Telephone Encounter (Signed)
Forward to provider

## 2015-05-16 NOTE — Telephone Encounter (Signed)
Patients wife notified

## 2015-05-16 NOTE — Telephone Encounter (Signed)
Patient wife called and wants to know if he can take Vitamin L-carnitine. They heard that this vitamin is good for him.

## 2015-05-16 NOTE — Telephone Encounter (Signed)
That is fine 

## 2015-06-13 ENCOUNTER — Other Ambulatory Visit: Payer: Self-pay | Admitting: Neurology

## 2015-06-13 NOTE — Telephone Encounter (Signed)
Requip and Carbidopa Levodopa refill requested. Per last office note- patient to remain on medication. Refill approved and sent to patient's pharmacy.   

## 2015-06-27 ENCOUNTER — Encounter: Payer: Self-pay | Admitting: Neurology

## 2015-06-27 ENCOUNTER — Ambulatory Visit (INDEPENDENT_AMBULATORY_CARE_PROVIDER_SITE_OTHER): Payer: Medicare PPO | Admitting: Neurology

## 2015-06-27 VITALS — BP 130/82 | HR 72 | Ht 70.0 in | Wt 267.0 lb

## 2015-06-27 DIAGNOSIS — G20 Parkinson's disease: Secondary | ICD-10-CM | POA: Diagnosis not present

## 2015-06-27 DIAGNOSIS — G4752 REM sleep behavior disorder: Secondary | ICD-10-CM | POA: Diagnosis not present

## 2015-06-27 DIAGNOSIS — R292 Abnormal reflex: Secondary | ICD-10-CM | POA: Diagnosis not present

## 2015-06-27 DIAGNOSIS — M542 Cervicalgia: Secondary | ICD-10-CM

## 2015-06-27 NOTE — Progress Notes (Signed)
Thomas [name] was seen today in the movement disorders clinic for neurologic consultation at the request of Park Liter, DO.  The consultation is for the evaluation of PD.  He is accompanied by his wife who supplements the history.  The patient has seen [name] at [facility] and I do have those notes.  The patient was dx with PD in Oct 2013 but first sx's probably started in the summer of 2013 with slowness of movement, R greater than L.  He does state that non-motor sx's like constipation occurred several years prior to this.  He was in [place] at the time.  He was initially started on Azilect in October 2013 and about 6 months later requip was added.  He was started initially at requip XL 2 mg and has worked up to requip XL 6 mg (the last increase was 10/2013).  He noticed a benefit with the requip but the XL is very expensive and asks about that.  He initially had some mental foginess with requip but that is better. Also asks about azilect as hasn't seen a benefit and is costing him $300.  Levodopa was then added by Dr. [name] in 03/2014.  It helped with cramping, rigidity.  He takes it at 4 am (but then goes back to sleep until 6am), 12pm, 8pm (bed at 8:30pm). He exercises faithfully.  He has neck turning to the right and has since the dx.  Some pain.     Specific Other Symptoms:  Tremor: Yes.  , only with pushing/pulling something or with stress Voice: weaker than in the past Sleep: sleeps well  Vivid Dreams:  Yes.    Acting out dreams:  Yes.   Wet Pillows: Yes.   (occasionally) Postural symptoms:  No. (unless gets up quick)  Falls?  No. Bradykinesia symptoms: slow movements; able to get up better now that on levodopa Loss of smell:  yes Loss of taste:  No. Urinary Incontinence: minimal - better with levodopa Difficulty Swallowing:  Yes.   but better now (would choke on saliva) Handwriting, micrographia: Yes.   (R hand dominant) Trouble with ADL's:  No. (shaving is easier now that  on levodopa; wife helps put on belt)  Trouble buttoning clothing: No. Depression:  No. Memory changes:  Yes.   but minor Hallucinations:  No.  visual distortions:rare N/V:  No. Lightheaded:  No.  Syncope: No. Diplopia:  No. Dyskinesia:  No.  Neuroimaging has previously been performed.  It is not available for my review today.  This was done in [place] prior to the diagnosis.  The patient does state that he had a TIA in 2007.  He was working in the yard on a hot day and he had just gone in and he sat down and felt a pain in the right side and tinnitus in the right ear and he had R face droop on the way to the hospital and R arm was "curling" on the way to the hospital.  He states that he was out of the window for TPA but was given IV heparin but was back to normal by 4 am (started at 5:30 pm but was at hospital by 6:30 pm so was unclear why out of TPA window).    10/26/14 update:  The patient returns today for follow-up, accompanied by his wife who supplements the history.  Last visit, changed him from Requip XL, 6 mg daily to ropinirole 2 mg 3 times per day, solely because of cost.  His Azilect was also discontinued because of cost.  His levodopa was increased to carbidopa/levodopa 25/100, 1-1/2 tablets 3 times per day.  I planned to order an MRI of the brain and cervical spine, but this was canceled by the patient because of cost.  Today, the patient states that he feels better overall and his gait is better; he no longer feels "foggy."  He is exercising.  He is doing cardio at [employer] and doing weights and cardio.  No falls.  Occ lightheadedness.  No syncope.  No hallucinations.  Very vivid dreams.  Is talking in sleep.  No hitting wife at night.  Not falling out bed.  "I feel better than I have in years."  02/27/15 update:  The patient is following up today regarding his Parkinson's disease.  He is currently on carbidopa/levodopa 25/100, 1-1/2 tablets 3 times per day in addition to  ropinirole, 2 mg 3 times per day (5-6am/11-12/and then q hs).  He had one fall on 12/30/14 since last visit.  He tripped over an uneven place in the sidewalk.  He states that he had rib pain but has recovered.  No syncope or lightheadedness.  No hallucinations.  Having very vivid dreams.  He will flail the arms and scream out but has not gotten hurt.  Wife states that it is actually a little better than in the past.  He started on melatonin and that helped his sleep.  He is noting stiffness in the neck and has trouble looking up.  He points to discomfort in the trapezius.  Late in the evening, he feels that his legs will "twitch" and he cannot sit still.  If he moves around, it will go away.    Asks me about changing his levitra to viagra.    06/27/15 update:  The patient is following up today regarding his Parkinson's disease.  He is currently on carbidopa/levodopa 25/100, 1-1/2 tablets 3 times per day in addition to ropinirole, 2 mg 3 times per day (5-6am/11-12/and then q hs).  States that he strained his back a few weeks ago (just moved wrong) and therefore and hasn't been as active.  Also has noted some tightness in his neck/trap and it has caused some nausea.  Doesn't think that it is at all related to wearing off of medication.  Still going to [employer] but not as vigorous with exercise.  No falls.  No lightheadedness.  Wife moved bedrooms but she can still sometimes hear him acting out the dream.    PREVIOUS MEDICATIONS: Sinemet, Requip and azilect  ALLERGIES:   Allergies  Allergen Reactions  . Codeine Other (See Comments)  . Codeine     hallucinations    CURRENT MEDICATIONS:  Outpatient Encounter Prescriptions as of 06/27/2015  Medication Sig  . aspirin 81 MG tablet Take 81 mg by mouth daily.  . carbidopa-levodopa (SINEMET IR) 25-100 MG tablet TAKE 1 AND 1/2 TABLETS THREE TIMES DAILY  . cetirizine (ZYRTEC) 10 MG tablet Take 10 mg by mouth daily.  . hydrochlorothiazide (MICROZIDE) 12.5 MG  capsule Take 1 capsule (12.5 mg total) by mouth daily.  Marland Kitchen L-Lysine 1000 MG TABS Take by mouth.  Marland Kitchen LEVITRA 20 MG tablet Take 20 mg by mouth as needed.   Marland Kitchen lisinopril (PRINIVIL,ZESTRIL) 30 MG tablet Take 1 tablet (30 mg total) by mouth daily.  . Melatonin 2.5 MG CAPS Take by mouth.  . Melatonin 2.5 MG CAPS Take by mouth at bedtime as needed.  Marland Kitchen rOPINIRole (REQUIP)  2 MG tablet TAKE 1 TABLET THREE TIMES DAILY  . sildenafil (VIAGRA) 100 MG tablet Take 0.5-1 tablets (50-100 mg total) by mouth daily as needed for erectile dysfunction.  . simvastatin (ZOCOR) 40 MG tablet Take 1 tablet (40 mg total) by mouth daily.  . traMADol (ULTRAM) 50 MG tablet   . XARELTO 20 MG TABS tablet TAKE 1 TABLET EVERY DAY   No facility-administered encounter medications on file as of 06/27/2015.    PAST MEDICAL HISTORY:   Past Medical History  Diagnosis Date  . PD (Parkinson's disease) ([place])   . Hyperchloremia   . Plantar wart   . Arthritis of knee   . DVT (deep venous thrombosis) ([facility])     L leg  . Seasonal allergies   . TIA (transient ischemic attack)   . Nephrolithiasis   . DVT (deep venous thrombosis) ([place])   . Parkinson's disease ([place])   . Pulmonary embolism ([place])   . Allergy   . Arthritis     Osteoarthritis  . Parkinson's disease ([place])   . Hypercholesterolemia   . Hypercholesterolemia   . Hypertension     PAST SURGICAL HISTORY:   Past Surgical History  Procedure Laterality Date  . Knee surgery Right   . Tonsillectomy    . Appendectomy    . T&a    . Knee arthroscopy with meniscal repair Right 2009    SOCIAL HISTORY:   Social History   Social History  . Marital Status: Married    Spouse Name: N/A  . Number of Children: N/A  . Years of Education: N/A   Occupational History  . 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 Smoking status: Never Smoker   . Smokeless tobacco: Never Used  . Alcohol Use: No  . Drug Use: No  . Sexual Activity: Not on file   Other Topics  Concern  . Not on file   Social History Narrative   ** Merged History Encounter **        FAMILY HISTORY:   Family Status  Relation Status Death Age  . Father Deceased     PD  . Mother Deceased     CHF  . Sister Deceased     CHF  . Sister Deceased     breast cancer  . Sister Alive     heart disease    ROS:  A complete 10 system review of systems was obtained and was unremarkable apart from what is mentioned above.  PHYSICAL EXAMINATION:    VITALS:   Filed Vitals:   06/27/15 1053  BP: 130/82  Pulse: 72  Height: 5\' 10"  (1.778 m)  Weight: 267 lb (121.11 kg)    GEN:  The patient appears stated age and is in NAD. HEENT:  Normocephalic, atraumatic.  The mucous membranes are moist. The superficial temporal arteries are without ropiness or tenderness. CV:  RRR Lungs:  CTAB Neck/HEME:  There are no carotid bruits bilaterally.  The neck is slightly turned into sidebending to the right.  Neurological examination:  Orientation: The patient is alert and oriented x3. Fund of knowledge is appropriate.  Recent and remote memory are intact.  Attention and concentration are normal.    Able to name objects and repeat phrases. Cranial nerves: There is good facial symmetry.  Extraocular muscles are intact. The visual fields are full to confrontational testing. The speech is fluent and clear. Soft palate rises symmetrically and there is no tongue deviation. Hearing  is intact to conversational tone. Sensation: Sensation is intact to light touch throughout Motor: Strength is 5/5 in the bilateral upper and lower extremities.   Shoulder shrug is equal and symmetric.  There is no pronator drift. DTR's:  3/4 at bilateral biceps, triceps, brachioradialis, patella (with cross adductor reflexes) and achilles.   Movement examination: Tone: There is mild increased tone in the bilateral UE.  Tone in the RLE is increased more than UE.   Abnormal movements: None Coordination:  There is no  decremation today with RAMs Gait and Station: The patient has no difficulty arising out of a deep-seated chair without the use of the hands. The patient's stride length is normal with slightly decreased arm swing bilaterally. He is more stooped than in the past.  The patient has a negative pull test.        ASSESSMENT/PLAN:  1.  Idiopathic Parkinson's disease, diagnosed in October 2013 in [place] with nonmotor symptoms that preceded this for several years.  Father had PD as well.    -continue ropinirole 2 mg 3 times a day because of cost.  Asked him to move last dose that he is taking at bedtime to dinnertime as he is getting RLS in the evening.  -continue carbidopa/levodopa 25/100-1-1/2 tablets 3 times per day Risks, benefits, side effects and alternative therapies were discussed.  The opportunity to ask questions was given and they were answered to the best of my ability.  The patient expressed understanding and willingness to follow the outlined treatment protocols.  -Is faithful with exercise and I congratulated him  -off azilect because of cost and doing well  -Increased fall risk because of stooped posture and decreased arm swing.  Refer for [facility]  -talked about OT exercises to help with dexterity issues 2.  Constipation, associated with Parkinson's disease.  -Patient doing well with prunes, occasional miralax and has rancho recipe 3.  Hyperreflexia with neck discomfort  -This could be associated with Parkinson's disease and the patient had a history of a prior TIA, but given his neck pain and dystonia (also could be associated with Parkinson's), I wanted to do an MRI brain and c-spine.  I talked with him about this again today.  He is very hyperreflexic.  He just states that it is cost prohibitive and he doesn't want it done.  I told him to let me know if he changes his mind.  -Told him today that stooped posture could play role in muscular component and talked about massage and soft  tissue techniques. 4.  REM behavior disorder  -not on meds.  Discussed in detail today.  Still doesn't want meds.  Will let me know if changes mind 5.  F/u in next 3 months, sooner should new neuro issues arise.

## 2015-07-03 ENCOUNTER — Ambulatory Visit
Admission: EM | Admit: 2015-07-03 | Discharge: 2015-07-03 | Disposition: A | Discharge status: Home / Self Care | Payer: Medicare PPO | Attending: Family Medicine | Admitting: Family Medicine

## 2015-07-03 ENCOUNTER — Encounter: Payer: Self-pay | Admitting: Emergency Medicine

## 2015-07-03 ENCOUNTER — Ambulatory Visit (INDEPENDENT_AMBULATORY_CARE_PROVIDER_SITE_OTHER): Payer: Medicare PPO

## 2015-07-03 DIAGNOSIS — J189 Pneumonia, unspecified organism: Secondary | ICD-10-CM

## 2015-07-03 DIAGNOSIS — R110 Nausea: Secondary | ICD-10-CM

## 2015-07-03 LAB — RAPID INFLUENZA A&B ANTIGENS (ARMC ONLY): INFLUENZA A (ARMC): NEGATIVE

## 2015-07-03 LAB — RAPID INFLUENZA A&B ANTIGENS: Influenza B (ARMC): NEGATIVE

## 2015-07-03 MED ORDER — LEVOFLOXACIN 500 MG PO TABS: 500.0000 mg | ORAL_TABLET | Freq: Every day | ORAL | Status: DC

## 2015-07-03 MED ORDER — ONDANSETRON 8 MG PO TBDP: 8.0000 mg | ORAL_TABLET | Freq: Three times a day (TID) | ORAL | Status: DC | PRN

## 2015-07-03 NOTE — ED Provider Notes (Signed)
CSN: [id number]     Arrival date & time 07/03/15  1347 History   None    Chief Complaint  Patient presents with  . Fever  . Nausea  . Cough   (Consider location/radiation/quality/duration/timing/severity/associated sxs/prior Treatment) Patient is a 65 y.o. male presenting with URI. The history is provided by the patient.  URI Presenting symptoms: cough and fever   Severity:  Moderate Onset quality:  Sudden Duration:  5 days Timing:  Constant Progression:  Worsening Chronicity:  New Relieved by:  Nothing Ineffective treatments:  OTC medications Associated symptoms: no headaches and no wheezing   Risk factors: sick contacts   Risk factors: not elderly, no chronic cardiac disease, no chronic kidney disease, no chronic respiratory disease, no immunosuppression, no recent illness and no recent travel     Past Medical History  Diagnosis Date  . PD (Parkinson's disease) ([place])   . Hyperchloremia   . Plantar wart   . Arthritis of knee   . DVT (deep venous thrombosis) ([facility])     L leg  . Seasonal allergies   . TIA (transient ischemic attack)   . Nephrolithiasis   . DVT (deep venous thrombosis) ([place])   . Parkinson's disease ([place])   . Pulmonary embolism ([place])   . Allergy   . Arthritis     Osteoarthritis  . Parkinson's disease ([place])   . Hypercholesterolemia   . Hypercholesterolemia   . Hypertension    Past Surgical History  Procedure Laterality Date  . Knee surgery Right   . Tonsillectomy    . Appendectomy    . T&a    . Knee arthroscopy with meniscal repair Right 2009   Family History  Problem Relation Age of Onset  . Coronary artery disease Mother   . Parkinson's disease Father   . Heart attack Sister   . Deep vein thrombosis Sister    Social History  Substance Use Topics  . Smoking status: Never Smoker   . Smokeless tobacco: Never Used  . Alcohol Use: No    Review of Systems  Constitutional: Positive for fever.  Respiratory: Positive for cough. Negative for  wheezing.   Neurological: Negative for headaches.    Allergies  Codeine and Codeine  Home Medications   Prior to Admission medications   Medication Sig Start Date End Date Taking? Authorizing Provider  aspirin 81 MG tablet Take 81 mg by mouth daily.    Historical Provider, MD  carbidopa-levodopa (SINEMET IR) 25-100 MG tablet TAKE 1 AND 1/2 TABLETS THREE TIMES DAILY 06/13/15   Eustace Quail Tat, DO  cetirizine (ZYRTEC) 10 MG tablet Take 10 mg by mouth daily.    Historical Provider, MD  hydrochlorothiazide (MICROZIDE) 12.5 MG capsule Take 1 capsule (12.5 mg total) by mouth daily. 03/01/15   Megan P Johnson, DO  L-Lysine 1000 MG TABS Take by mouth.    Historical Provider, MD  LEVITRA 20 MG tablet Take 20 mg by mouth as needed.  06/21/14   Historical Provider, MD  levofloxacin (LEVAQUIN) 500 MG tablet Take 1 tablet (500 mg total) by mouth daily. 07/03/15   Norval Gable, MD  lisinopril (PRINIVIL,ZESTRIL) 30 MG tablet Take 1 tablet (30 mg total) by mouth daily. 03/01/15   Megan P Johnson, DO  Melatonin 2.5 MG CAPS Take by mouth.    Historical Provider, MD  Melatonin 2.5 MG CAPS Take by mouth at bedtime as needed.    Historical Provider, MD  ondansetron (ZOFRAN ODT) 8 MG disintegrating tablet Take 1 tablet (8  mg total) by mouth every 8 (eight) hours as needed for nausea or vomiting. 07/03/15   Norval Gable, MD  rOPINIRole (REQUIP) 2 MG tablet TAKE 1 TABLET THREE TIMES DAILY 06/13/15   Eustace Quail Tat, DO  sildenafil (VIAGRA) 100 MG tablet Take 0.5-1 tablets (50-100 mg total) by mouth daily as needed for erectile dysfunction. 03/01/15   Megan P Johnson, DO  simvastatin (ZOCOR) 40 MG tablet Take 1 tablet (40 mg total) by mouth daily. 03/01/15   Megan Annia Friendly, DO  traMADol (ULTRAM) 50 MG tablet  10/05/14   Historical Provider, MD  XARELTO 20 MG TABS tablet TAKE 1 TABLET EVERY DAY 01/08/15   Megan Annia Friendly, DO   Meds Ordered and Administered this Visit  Medications - No data to display  BP 116/64 mmHg   Pulse 83  Temp(Src) 99.3 F (37.4 C) (Oral)  Resp 16  Ht 5\' 10"  (1.778 m)  Wt 265 lb (120.203 kg)  BMI 38.02 kg/m2  SpO2 100% No data found.   Physical Exam  Constitutional: He appears well-developed and well-nourished. No distress.  HENT:  Head: Normocephalic and atraumatic.  Right Ear: Tympanic membrane, external ear and ear canal normal.  Left Ear: Tympanic membrane, external ear and ear canal normal.  Nose: Nose normal.  Mouth/Throat: Uvula is midline, oropharynx is clear and moist and mucous membranes are normal. No oropharyngeal exudate or tonsillar abscesses.  Eyes: Conjunctivae and EOM are normal. Pupils are equal, round, and reactive to light. Right eye exhibits no discharge. Left eye exhibits no discharge. No scleral icterus.  Neck: Normal range of motion. Neck supple. No tracheal deviation present. No thyromegaly present.  Cardiovascular: Normal rate, regular rhythm and normal heart sounds.   Pulmonary/Chest: Effort normal. No stridor. No respiratory distress. He has no wheezes. He has rales (bases). He exhibits no tenderness.  Lymphadenopathy:    He has no cervical adenopathy.  Neurological: He is alert.  Skin: Skin is warm and dry. No rash noted. He is not diaphoretic.  Nursing note and vitals reviewed.   ED Course  Procedures (including critical care time)  Labs Review Labs Reviewed  RAPID INFLUENZA A&B ANTIGENS ([place])    Imaging Review Dg Chest 2 View  07/03/2015  CLINICAL DATA:  Cough, body aches, fever EXAM: CHEST  2 VIEW COMPARISON:  None. FINDINGS: Cardiomediastinal silhouette is unremarkable. Mild mid thoracic dextroscoliosis. No pulmonary edema. Mild elevation of the left hemidiaphragm. There is streaky left base retrocardiac atelectasis or early infiltrate. IMPRESSION: Mild elevation of the left hemidiaphragm. Streaky left basilar retrocardiac atelectasis or early infiltrate. No pulmonary edema. Electronically Signed   By: Lahoma Crocker M.D.   On:  07/03/2015 16:45     Visual Acuity Review  Right Eye Distance:   Left Eye Distance:   Bilateral Distance:    Right Eye Near:   Left Eye Near:    Bilateral Near:         MDM   1. CAP (community acquired pneumonia)   2. Nausea    Discharge Medication List as of 07/03/2015  5:20 PM    START taking these medications   Details  levofloxacin (LEVAQUIN) 500 MG tablet Take 1 tablet (500 mg total) by mouth daily., Starting 07/03/2015, Until Discontinued, Normal       1. Labs/x-ray results and diagnosis reviewed with patient 2. rx as per orders above; reviewed possible side effects, interactions, risks and benefits  3. Recommend supportive treatment with  4. Follow-up prn if symptoms worsen  or don't improve    Norval Gable, MD 07/03/15 2322

## 2015-07-03 NOTE — Discharge Instructions (Signed)

## 2015-07-03 NOTE — ED Notes (Signed)
Patient c/o dry cough, bodyaches, fever, and nausea that started Friday.

## 2015-07-06 ENCOUNTER — Emergency Department: Payer: Medicare PPO

## 2015-07-06 ENCOUNTER — Encounter: Payer: Self-pay | Admitting: Emergency Medicine

## 2015-07-06 ENCOUNTER — Emergency Department
Admission: EM | Admit: 2015-07-06 | Discharge: 2015-07-06 | Disposition: A | Discharge status: Home / Self Care | Payer: Medicare PPO | Attending: Emergency Medicine | Admitting: Emergency Medicine

## 2015-07-06 DIAGNOSIS — Z79899 Other long term (current) drug therapy: Secondary | ICD-10-CM | POA: Diagnosis not present

## 2015-07-06 DIAGNOSIS — G20 Parkinson's disease: Secondary | ICD-10-CM | POA: Diagnosis not present

## 2015-07-06 DIAGNOSIS — Z7982 Long term (current) use of aspirin: Secondary | ICD-10-CM | POA: Insufficient documentation

## 2015-07-06 DIAGNOSIS — I480 Paroxysmal atrial fibrillation: Secondary | ICD-10-CM | POA: Insufficient documentation

## 2015-07-06 DIAGNOSIS — R002 Palpitations: Secondary | ICD-10-CM | POA: Diagnosis present

## 2015-07-06 DIAGNOSIS — I82409 Acute embolism and thrombosis of unspecified deep veins of unspecified lower extremity: Secondary | ICD-10-CM | POA: Diagnosis not present

## 2015-07-06 DIAGNOSIS — I2699 Other pulmonary embolism without acute cor pulmonale: Secondary | ICD-10-CM | POA: Diagnosis not present

## 2015-07-06 DIAGNOSIS — Z8673 Personal history of transient ischemic attack (TIA), and cerebral infarction without residual deficits: Secondary | ICD-10-CM | POA: Diagnosis not present

## 2015-07-06 DIAGNOSIS — I4891 Unspecified atrial fibrillation: Secondary | ICD-10-CM

## 2015-07-06 DIAGNOSIS — E7800 Pure hypercholesterolemia, unspecified: Secondary | ICD-10-CM | POA: Diagnosis not present

## 2015-07-06 DIAGNOSIS — I10 Essential (primary) hypertension: Secondary | ICD-10-CM | POA: Insufficient documentation

## 2015-07-06 LAB — CBC WITH DIFFERENTIAL/PLATELET
Basophils Absolute: 0.1 10*3/uL (ref 0–0.1)
Basophils Relative: 1 %
Eosinophils Absolute: 0.4 10*3/uL (ref 0–0.7)
Eosinophils Relative: 5 %
HEMATOCRIT: 38.1 % — AB (ref 40.0–52.0)
Hemoglobin: 12.8 g/dL — ABNORMAL LOW (ref 13.0–18.0)
LYMPHS ABS: 1 10*3/uL (ref 1.0–3.6)
LYMPHS PCT: 11 %
MCH: 29.6 pg (ref 26.0–34.0)
MCHC: 33.7 g/dL (ref 32.0–36.0)
MCV: 87.8 fL (ref 80.0–100.0)
MONO ABS: 0.8 10*3/uL (ref 0.2–1.0)
MONOS PCT: 8 %
NEUTROS ABS: 7 10*3/uL — AB (ref 1.4–6.5)
Neutrophils Relative %: 75 %
Platelets: 196 10*3/uL (ref 150–440)
RBC: 4.34 MIL/uL — ABNORMAL LOW (ref 4.40–5.90)
RDW: 12.9 % (ref 11.5–14.5)
WBC: 9.3 10*3/uL (ref 3.8–10.6)

## 2015-07-06 LAB — BASIC METABOLIC PANEL
Anion gap: 7 (ref 5–15)
BUN: 18 mg/dL (ref 6–20)
CHLORIDE: 105 mmol/L (ref 101–111)
CO2: 25 mmol/L (ref 22–32)
Calcium: 8.9 mg/dL (ref 8.9–10.3)
Creatinine, Ser: 1.15 mg/dL (ref 0.61–1.24)
GFR calc Af Amer: >60 mL/min (ref >60)
GFR calc non Af Amer: >60 mL/min (ref >60)
GLUCOSE: 116 mg/dL — AB (ref 65–99)
POTASSIUM: 3.7 mmol/L (ref 3.5–5.1)
Sodium: 137 mmol/L (ref 135–145)

## 2015-07-06 LAB — TSH: TSH: 2.09 u[IU]/mL (ref 0.350–4.500)

## 2015-07-06 LAB — TROPONIN I: Troponin I: <0.03 ng/mL (ref <0.031)

## 2015-07-06 LAB — BRAIN NATRIURETIC PEPTIDE: B Natriuretic Peptide: 296 pg/mL — ABNORMAL HIGH (ref 0.0–100.0)

## 2015-07-06 MED ORDER — DILTIAZEM HCL 25 MG/5ML IV SOLN
INTRAVENOUS | Status: AC
  Filled 2015-07-06: qty 5

## 2015-07-06 MED ORDER — AMIODARONE HCL 200 MG PO TABS: 200.0000 mg | ORAL_TABLET | Freq: Two times a day (BID) | ORAL | Status: DC

## 2015-07-06 MED ORDER — DILTIAZEM HCL 25 MG/5ML IV SOLN: 20.0000 mg | Freq: Once | INTRAVENOUS | Status: DC

## 2015-07-06 MED ORDER — SODIUM CHLORIDE 0.9 % IV BOLUS (SEPSIS)
500.0000 mL | Freq: Once | INTRAVENOUS | Status: AC
  Administered 2015-07-06: 500 mL via INTRAVENOUS

## 2015-07-06 MED ORDER — AMIODARONE IV BOLUS ONLY 150 MG/100ML
150.0000 mg | Freq: Once | INTRAVENOUS | Status: AC
  Administered 2015-07-06: 150 mg via INTRAVENOUS
  Filled 2015-07-06: qty 100

## 2015-07-06 MED ORDER — DILTIAZEM HCL 100 MG IV SOLR
5.0000 mg/h | Freq: Once | INTRAVENOUS | Status: DC
  Filled 2015-07-06: qty 100

## 2015-07-06 MED ORDER — AMIODARONE IV BOLUS ONLY 150 MG/100ML
INTRAVENOUS | Status: AC
  Filled 2015-07-06: qty 100

## 2015-07-06 NOTE — ED Notes (Signed)
Pt presents to ED with palpitations. Pt recently diagnosed with pneumonia and being treated with levaquin. Pt states chest feels sore. Denies shortness of breath, dizziness, lightheadedness and nausea and vomiting.

## 2015-07-06 NOTE — ED Provider Notes (Addendum)
[facility] Emergency Department Provider Note  ____________________________________________   I have reviewed the triage vital signs and the nursing notes.   HISTORY  Chief Complaint Palpitations    HPI Thomas [name] is a 65 y.o. male with a history ofpulmonary embolism on Xarelto, recently diagnosed with UTI/pneumonia. Taking Levaquin for that. Patient states that he began to have palpitations earlier this week. He is been taking DayQuil for his cold symptoms, he states after he takes it, he starts to have palpitations. They usually only last a few minutes. However, this morning, they lasted longer and he had symptoms "soreness" in his chest at the same time. He denies any pulmonary symptoms and he feels that his cough and his other symptoms are improving from a URI sampling. No recent fever. The patient states that he is not having any pain at this moment. He denies any nausea or vomiting. He states he was having some chest "soreness" but mostly a sensation of palpitations. At this time the palpitations continue. The symptoms started this morning before coming in. Nothing made it better nothing made them worse. It was sudden in onset after taking a DayQuil, the duration has been for a few hours at most this morning, nothing makes it better nothing makes it worse, aside from a "soreness" note associated symptoms, states that he does not have a history of CAD.  Past Medical History  Diagnosis Date  . PD (Parkinson's disease) ([place])   . Hyperchloremia   . Plantar wart   . Arthritis of knee   . DVT (deep venous thrombosis) ([facility])     L leg  . Seasonal allergies   . TIA (transient ischemic attack)   . Nephrolithiasis   . DVT (deep venous thrombosis) ([place])   . Parkinson's disease ([place])   . Pulmonary embolism ([place])   . Allergy   . Arthritis     Osteoarthritis  . Parkinson's disease ([place])   . Hypercholesterolemia   . Hypercholesterolemia   . Hypertension      Patient Active Problem List   Diagnosis Date Noted  . Erectile dysfunction 03/01/2015  . Hypercholesterolemia   . Hypertension   . DVT (deep venous thrombosis) ([place])   . Parkinson's disease ([place])   . Pulmonary embolism [facility])     Past Surgical History  Procedure Laterality Date  . Knee surgery Right   . Tonsillectomy    . Appendectomy    . T&a    . Knee arthroscopy with meniscal repair Right 2009    Current Outpatient Rx  Name  Route  Sig  Dispense  Refill  . aspirin 81 MG tablet   Oral   Take 81 mg by mouth daily.         . carbidopa-levodopa (SINEMET IR) 25-100 MG tablet      TAKE 1 AND 1/2 TABLETS THREE TIMES DAILY   405 tablet   1   . cetirizine (ZYRTEC) 10 MG tablet   Oral   Take 10 mg by mouth daily.         . hydrochlorothiazide (MICROZIDE) 12.5 MG capsule   Oral   Take 1 capsule (12.5 mg total) by mouth daily.   90 capsule   1   . L-Lysine 1000 MG TABS   Oral   Take by mouth.         Marland Kitchen LEVITRA 20 MG tablet   Oral   Take 20 mg by mouth as needed.  11     Dispense as written.   Marland Kitchen levofloxacin (LEVAQUIN) 500 MG tablet   Oral   Take 1 tablet (500 mg total) by mouth daily.   7 tablet   0   . lisinopril (PRINIVIL,ZESTRIL) 30 MG tablet   Oral   Take 1 tablet (30 mg total) by mouth daily.   90 tablet   1   . Melatonin 2.5 MG CAPS   Oral   Take by mouth.         . Melatonin 2.5 MG CAPS   Oral   Take by mouth at bedtime as needed.         . ondansetron (ZOFRAN ODT) 8 MG disintegrating tablet   Oral   Take 1 tablet (8 mg total) by mouth every 8 (eight) hours as needed for nausea or vomiting.   6 tablet   0   . rOPINIRole (REQUIP) 2 MG tablet      TAKE 1 TABLET THREE TIMES DAILY   270 tablet   1   . sildenafil (VIAGRA) 100 MG tablet   Oral   Take 0.5-1 tablets (50-100 mg total) by mouth daily as needed for erectile dysfunction.   5 tablet   11   . simvastatin (ZOCOR) 40 MG tablet   Oral   Take 1 tablet (40  mg total) by mouth daily.   90 tablet   1   . traMADol (ULTRAM) 50 MG tablet               . XARELTO 20 MG TABS tablet      TAKE 1 TABLET EVERY DAY   90 tablet   1     Allergies Codeine and Codeine  Family History  Problem Relation Age of Onset  . Coronary artery disease Mother   . Parkinson's disease Father   . Heart attack Sister   . Deep vein thrombosis Sister     Social History Social History  Substance Use Topics  . Smoking status: Never Smoker   . Smokeless tobacco: Never Used  . Alcohol Use: No    Review of Systems Constitutional: No fever/chills Eyes: No visual changes. ENT: No sore throat. No stiff neck no neck pain Cardiovascular: See history of present illness regarding chest pain Respiratory: Denies shortness of breath. Gastrointestinal:   no vomiting.  No diarrhea.  No constipation. Genitourinary: Negative for dysuria. Musculoskeletal: Negative lower extremity swelling Skin: Negative for rash. Neurological: Negative for headaches, focal weakness or numbness. 10-point ROS otherwise negative.  ____________________________________________   PHYSICAL EXAM:  VITAL SIGNS: ED Triage Vitals  Enc Vitals Group     BP 07/06/15 1146 151/125 mmHg     Pulse Rate 07/06/15 1200 80     Resp 07/06/15 1146 24     Temp 07/06/15 1146 97.8 F (36.6 C)     Temp Source 07/06/15 1146 Oral     SpO2 07/06/15 1146 98 %     Weight 07/06/15 1146 250 lb (113.399 kg)     Height 07/06/15 1146 5\' 10"  (1.778 m)     Head Cir --      Peak Flow --      Pain Score 07/06/15 1204 7     Pain Loc --      Pain Edu? --      Excl. in [place]? --     Constitutional: Alert and oriented. Well appearing and in no acute distress. Eyes: Conjunctivae are normal. PERRL. EOMI. Head: Atraumatic. Nose: No congestion/rhinnorhea.  Mouth/Throat: Mucous membranes are moist.  Oropharynx non-erythematous. Neck: No stridor.   Nontender with no meningismus Cardiovascular: Tachycardia and  irregularly irregular. Grossly normal heart sounds.  Good peripheral circulation. Respiratory: Normal respiratory effort.  No retractions. Lungs CTAB. Abdominal: Soft and nontender. No distention. No guarding no rebound Back:  There is no focal tenderness or step off there is no midline tenderness there are no lesions noted. there is no CVA tenderness Musculoskeletal: No lower extremity tenderness. No joint effusions, no DVT signs strong distal pulses no edema Neurologic:  Normal speech and language. No gross focal neurologic deficits are appreciated.  Skin:  Skin is warm, dry and intact. No rash noted. Psychiatric: Mood and affect are mildly anxious. Speech and behavior are normal.  ____________________________________________   LABS (all labs ordered are listed, but only abnormal results are displayed)  Labs Reviewed  CBC WITH DIFFERENTIAL/PLATELET  BRAIN NATRIURETIC PEPTIDE  TROPONIN I  BASIC METABOLIC PANEL  TSH   ____________________________________________  EKG  I personally interpreted any EKGs ordered by me or triage EKGs were performed, the first shows as atrial fibrillation with rapid ventricular response rate 157 with no acute ST elevation or depression with PVCs noted. The second shows normal sinus rhythm rate 81 bpm with no acute ST elevation or depression normal axis unremarkable questionable old infarct noted ____________________________________________  RADIOLOGY  I reviewed any imaging ordered by me or triage that were performed during my shift and, if possible, patient and/or family made aware of any abnormal findings. ____________________________________________   PROCEDURES  Procedure(s) performed: None  Critical Care performed: None  ____________________________________________   INITIAL IMPRESSION / ASSESSMENT AND PLAN / ED COURSE  Pertinent labs & imaging results that were available during my care of the patient were reviewed by me and considered  in my medical decision making (see chart for details).  Patient here with A. fib with RVR after taking decongestants. At this time he has converted, and he feels much better. Chest x-ray is reassuring. He did not require any medication for me to convert. Old work is pending. Sats are 100% and he has no complaints at this time. He was mildly anxious with anything in some ways accounts for his mild tachypnea, but his heart rate is now in the 80s. He has no chest pain or shortness of breath at this time. We will observe him closely in the emergency room. He states he is asymptomatic at this time.  ----------------------------------------- 2:40 PM on 07/06/2015 -----------------------------------------  She remains with no symptoms. His wife is into the door twice asking when he might be able to go home. He has no chest pain or shortness of breath. His was a situational atrial fibrillation provoked by cold medicine. I did talk to Dr. Humphrey Rolls, cardiology, who states that he would like me to give the patient 150 bolus of amiodarone and start him on 400 a day for the next 3 or 4 days and he will see him on Monday or Tuesday. Patient is in agreement with this plan. Dr. Humphrey Rolls does not wish me to keep the patient for further enzymes or other evaluation. In concordance with cardiology consultants and given that the patient has no symptoms and is requesting discharge we will follow up in this manner. Return precautions and follow-up given and understood. I did discuss with Dr. Humphrey Rolls that the fact that the patient taking Levaquin and asked him with amiodarone whether he would change that antibiotic but he says that the QT prolonged patient with  amiodarone is not sufficient to indicate changing that medication. Patient's QTc is 435 prior to administration of meds. ____________________________________________   FINAL CLINICAL IMPRESSION(S) / ED DIAGNOSES  Final diagnoses:  A-fib ([place])      This chart was dictated  using voice recognition software.  Despite best efforts to proofread,  errors can occur which can change meaning.     Schuyler Amor, MD 07/06/15 1246  Schuyler Amor, MD 07/06/15 [place], MD 07/06/15 [phone]

## 2015-07-06 NOTE — Discharge Instructions (Signed)
Do not take viagra or any other medications without Friday while taking these meds. Do not take any Sudafed or pseudoephedrine containing steatorrhea and take cold medications or any other stimulants. Avoid caffeine. If you have chest pain, shortness of breath, or you go back into atrial fibrillation for any more than a few minutes return to the emergency department. Atrial Fibrillation Atrial fibrillation is a type of irregular or rapid heartbeat (arrhythmia). In atrial fibrillation, the heart quivers continuously in a chaotic pattern. This occurs when parts of the heart receive disorganized signals that make the heart unable to pump blood normally. This can increase the risk for stroke, heart failure, and other heart-related conditions. There are different types of atrial fibrillation, including:  Paroxysmal atrial fibrillation. This type starts suddenly, and it usually stops on its own shortly after it starts.  Persistent atrial fibrillation. This type often lasts longer than a week. It may stop on its own or with treatment.  Long-lasting persistent atrial fibrillation. This type lasts longer than 12 months.  Permanent atrial fibrillation. This type does not go away. Talk with your health care provider to learn about the type of atrial fibrillation that you have. CAUSES This condition is caused by some heart-related conditions or procedures, including:  A heart attack.  Coronary artery disease.  Heart failure.  Heart valve conditions.  High blood pressure.  Inflammation of the sac that surrounds the heart (pericarditis).  Heart surgery.  Certain heart rhythm disorders, such as Wolf-Parkinson-White syndrome. Other causes include:  Pneumonia.  Obstructive sleep apnea.  Blockage of an artery in the lungs (pulmonary embolism, or PE).  Lung cancer.  Chronic lung disease.  Thyroid problems, especially if the thyroid is overactive (hyperthyroidism).  Caffeine.  Excessive  alcohol use or illegal drug use.  Use of some medicines, including certain decongestants and diet pills. Sometimes, the cause cannot be found. RISK FACTORS This condition is more likely to develop in:  People who are older in age.  People who smoke.  People who have diabetes mellitus.  People who are overweight (obese).  Athletes who exercise vigorously. SYMPTOMS Symptoms of this condition include:  A feeling that your heart is beating rapidly or irregularly.  A feeling of discomfort or pain in your chest.  Shortness of breath.  Sudden light-headedness or weakness.  Getting tired easily during exercise. In some cases, there are no symptoms. DIAGNOSIS Your health care provider may be able to detect atrial fibrillation when taking your pulse. If detected, this condition may be diagnosed with:  An electrocardiogram (ECG).  A Holter monitor test that records your heartbeat patterns over a 24-hour period.  Transthoracic echocardiogram (TTE) to evaluate how blood flows through your heart.  Transesophageal echocardiogram (TEE) to view more detailed images of your heart.  A stress test.  Imaging tests, such as a CT scan or chest X-ray.  Blood tests. TREATMENT The main goals of treatment are to prevent blood clots from forming and to keep your heart beating at a normal rate and rhythm. The type of treatment that you receive depends on many factors, such as your underlying medical conditions and how you feel when you are experiencing atrial fibrillation. This condition may be treated with:  Medicine to slow down the heart rate, bring the heart's rhythm back to normal, or prevent clots from forming.  Electrical cardioversion. This is a procedure that resets your heart's rhythm by delivering a controlled, low-energy shock to the heart through your skin.  Different types of ablation,  such as catheter ablation, catheter ablation with pacemaker, or surgical ablation. These  procedures destroy the heart tissues that send abnormal signals. When the pacemaker is used, it is placed under your skin to help your heart beat in a regular rhythm. HOME CARE INSTRUCTIONS  Take over-the counter and prescription medicines only as told by your health care provider.  If your health care provider prescribed a blood-thinning medicine (anticoagulant), take it exactly as told. Taking too much blood-thinning medicine can cause bleeding. If you do not take enough blood-thinning medicine, you will not have the protection that you need against stroke and other problems.  Do not use tobacco products, including cigarettes, chewing tobacco, and e-cigarettes. If you need help quitting, ask your health care provider.  If you have obstructive sleep apnea, manage your condition as told by your health care provider.  Do not drink alcohol.  Do not drink beverages that contain caffeine, such as coffee, soda, and tea.  Maintain a healthy weight. Do not use diet pills unless your health care provider approves. Diet pills may make heart problems worse.  Follow diet instructions as told by your health care provider.  Exercise regularly as told by your health care provider.  Keep all follow-up visits as told by your health care provider. This is important. PREVENTION  Avoid drinking beverages that contain caffeine or alcohol.  Avoid certain medicines, especially medicines that are used for breathing problems.  Avoid certain herbs and herbal medicines, such as those that contain ephedra or ginseng.  Do not use illegal drugs, such as cocaine and amphetamines.  Do not smoke.  Manage your high blood pressure. SEEK MEDICAL CARE IF:  You notice a change in the rate, rhythm, or strength of your heartbeat.  You are taking an anticoagulant and you notice increased bruising.  You tire more easily when you exercise or exert yourself. SEEK IMMEDIATE MEDICAL CARE IF:  You have chest pain,  abdominal pain, sweating, or weakness.  You feel nauseous.  You notice blood in your vomit, bowel movement, or urine.  You have shortness of breath.  You suddenly have swollen feet and ankles.  You feel dizzy.  You have sudden weakness or numbness of the face, arm, or leg, especially on one side of the body.  You have trouble speaking, trouble understanding, or both (aphasia).  Your face or your eyelid droops on one side. These symptoms may represent a serious problem that is an emergency. Do not wait to see if the symptoms will go away. Get medical help right away. Call your local emergency services (911 in the [place] Do not drive yourself to the hospital.   This information is not intended to replace advice given to you by your health care provider. Make sure you discuss any questions you have with your health care provider.   Document Released: 03/31/2005 Document Revised: 12/20/2014 Document Reviewed: 07/26/2014 Elsevier Interactive Patient Education [employer].

## 2015-07-10 ENCOUNTER — Ambulatory Visit: Payer: Medicare PPO | Admitting: Family Medicine

## 2015-07-13 ENCOUNTER — Other Ambulatory Visit: Payer: Self-pay | Admitting: Family Medicine

## 2015-07-23 ENCOUNTER — Telehealth: Payer: Self-pay | Admitting: Neurology

## 2015-07-23 NOTE — Telephone Encounter (Signed)
ERROR

## 2015-08-02 ENCOUNTER — Ambulatory Visit (INDEPENDENT_AMBULATORY_CARE_PROVIDER_SITE_OTHER): Payer: Medicare PPO | Admitting: Neurology

## 2015-08-02 ENCOUNTER — Encounter: Payer: Self-pay | Admitting: Neurology

## 2015-08-02 VITALS — BP 120/82 | HR 60 | Ht 70.0 in | Wt 262.0 lb

## 2015-08-02 DIAGNOSIS — I4891 Unspecified atrial fibrillation: Secondary | ICD-10-CM | POA: Diagnosis not present

## 2015-08-02 DIAGNOSIS — G4752 REM sleep behavior disorder: Secondary | ICD-10-CM

## 2015-08-02 DIAGNOSIS — G20 Parkinson's disease: Secondary | ICD-10-CM | POA: Diagnosis not present

## 2015-08-02 NOTE — Progress Notes (Signed)
Thomas [name] was seen today in the movement disorders clinic for neurologic consultation at the request of Park Liter, DO.  The consultation is for the evaluation of PD.  He is accompanied by his wife who supplements the history.  The patient has seen [name] at [facility] and I do have those notes.  The patient was dx with PD in Oct 2013 but first sx's probably started in the summer of 2013 with slowness of movement, R greater than L.  He does state that non-motor sx's like constipation occurred several years prior to this.  He was in [place] at the time.  He was initially started on Azilect in October 2013 and about 6 months later requip was added.  He was started initially at requip XL 2 mg and has worked up to requip XL 6 mg (the last increase was 10/2013).  He noticed a benefit with the requip but the XL is very expensive and asks about that.  He initially had some mental foginess with requip but that is better. Also asks about azilect as hasn't seen a benefit and is costing him $300.  Levodopa was then added by Dr. [name] in 03/2014.  It helped with cramping, rigidity.  He takes it at 4 am (but then goes back to sleep until 6am), 12pm, 8pm (bed at 8:30pm). He exercises faithfully.  He has neck turning to the right and has since the dx.  Some pain.     Specific Other Symptoms:  Tremor: Yes.  , only with pushing/pulling something or with stress Voice: weaker than in the past Sleep: sleeps well  Vivid Dreams:  Yes.    Acting out dreams:  Yes.   Wet Pillows: Yes.   (occasionally) Postural symptoms:  No. (unless gets up quick)  Falls?  No. Bradykinesia symptoms: slow movements; able to get up better now that on levodopa Loss of smell:  yes Loss of taste:  No. Urinary Incontinence: minimal - better with levodopa Difficulty Swallowing:  Yes.   but better now (would choke on saliva) Handwriting, micrographia: Yes.   (R hand dominant) Trouble with ADL's:  No. (shaving is easier now that  on levodopa; wife helps put on belt)  Trouble buttoning clothing: No. Depression:  No. Memory changes:  Yes.   but minor Hallucinations:  No.  visual distortions:rare N/V:  No. Lightheaded:  No.  Syncope: No. Diplopia:  No. Dyskinesia:  No.  Neuroimaging has previously been performed.  It is not available for my review today.  This was done in [place] prior to the diagnosis.  The patient does state that he had a TIA in 2007.  He was working in the yard on a hot day and he had just gone in and he sat down and felt a pain in the right side and tinnitus in the right ear and he had R face droop on the way to the hospital and R arm was "curling" on the way to the hospital.  He states that he was out of the window for TPA but was given IV heparin but was back to normal by 4 am (started at 5:30 pm but was at hospital by 6:30 pm so was unclear why out of TPA window).    10/26/14 update:  The patient returns today for follow-up, accompanied by his wife who supplements the history.  Last visit, changed him from Requip XL, 6 mg daily to ropinirole 2 mg 3 times per day, solely because of cost.  His Azilect was also discontinued because of cost.  His levodopa was increased to carbidopa/levodopa 25/100, 1-1/2 tablets 3 times per day.  I planned to order an MRI of the brain and cervical spine, but this was canceled by the patient because of cost.  Today, the patient states that he feels better overall and his gait is better; he no longer feels "foggy."  He is exercising.  He is doing cardio at [employer] and doing weights and cardio.  No falls.  Occ lightheadedness.  No syncope.  No hallucinations.  Very vivid dreams.  Is talking in sleep.  No hitting wife at night.  Not falling out bed.  "I feel better than I have in years."  02/27/15 update:  The patient is following up today regarding his Parkinson's disease.  He is currently on carbidopa/levodopa 25/100, 1-1/2 tablets 3 times per day in addition to  ropinirole, 2 mg 3 times per day (5-6am/11-12/and then q hs).  He had one fall on 12/30/14 since last visit.  He tripped over an uneven place in the sidewalk.  He states that he had rib pain but has recovered.  No syncope or lightheadedness.  No hallucinations.  Having very vivid dreams.  He will flail the arms and scream out but has not gotten hurt.  Wife states that it is actually a little better than in the past.  He started on melatonin and that helped his sleep.  He is noting stiffness in the neck and has trouble looking up.  He points to discomfort in the trapezius.  Late in the evening, he feels that his legs will "twitch" and he cannot sit still.  If he moves around, it will go away.    Asks me about changing his levitra to viagra.    06/27/15 update:  The patient is following up today regarding his Parkinson's disease.  He is currently on carbidopa/levodopa 25/100, 1-1/2 tablets 3 times per day in addition to ropinirole, 2 mg 3 times per day (5-6am/11-12/and then q hs).  States that he strained his back a few weeks ago (just moved wrong) and therefore and hasn't been as active.  Also has noted some tightness in his neck/trap and it has caused some nausea.  Doesn't think that it is at all related to wearing off of medication.  Still going to [employer] but not as vigorous with exercise.  No falls.  No lightheadedness.  Wife moved bedrooms but she can still sometimes hear him acting out the dream.    08/02/15 update:  The patient is following up today, much earlier than expected. This patient is accompanied in the office by his spouse who supplements the history. I have reviewed prior records made available to me.  He remains on Requip, 2 mg 3 times per day as well as carbidopa/levodopa 25/100, 1-1/2 tablets 3 times per day.  He presented to the emergency room on 07/03/2015 with community-acquired pneumonia and was treated with Levaquin.  He was taking an over-the-counter decongestant and NyQuil for his  symptoms.  He went back to the emergency room on 07/06/2015 with palpitations and it was determined that he was in atrial fibrillation and it was thought that it was due to the over-the-counter decongestant.  He spontaneously converted back to normal sinus rhythm in the ED.  The emergency room physician consulted with the cardiologist over the telephone and started him on amiodarone.  The patient was to follow-up with the cardiologist.  I do not have any  other cardiology records.  The patient states that he did a nuclear stress test and "passed that."  He is going to leave him on the amiodarone lifetime per the patient.   Pt states that since he was put on that he has been sluggish.  He is having some lightheadedness especially in the AM and it has affected his AM workout routine.  He feels that his PD has worsened and feels more tremulous.  He was going to get LSVT, but states that the therapy was very expensive for him.  PREVIOUS MEDICATIONS: Sinemet, Requip and azilect  ALLERGIES:   Allergies  Allergen Reactions  . Codeine Other (See Comments)    Reaction: Hallucinations    CURRENT MEDICATIONS:  Outpatient Encounter Prescriptions as of 08/02/2015  Medication Sig  . amiodarone (PACERONE) 200 MG tablet Take 1 tablet (200 mg total) by mouth 2 (two) times daily.  Marland Kitchen aspirin 81 MG tablet Take 81 mg by mouth daily.  . carbidopa-levodopa (SINEMET IR) 25-100 MG tablet TAKE 1 AND 1/2 TABLETS THREE TIMES DAILY  . cetirizine (ZYRTEC) 10 MG tablet Take 10 mg by mouth daily.  . hydrochlorothiazide (MICROZIDE) 12.5 MG capsule TAKE 1 CAPSULE (12.5 MG TOTAL) BY MOUTH DAILY.  Marland Kitchen L-Lysine 1000 MG TABS Take by mouth.  Marland Kitchen lisinopril (PRINIVIL,ZESTRIL) 30 MG tablet Take 1 tablet (30 mg total) by mouth daily.  . Melatonin 2.5 MG CAPS Take 1 capsule by mouth at bedtime as needed (for sleep).   . ondansetron (ZOFRAN ODT) 8 MG disintegrating tablet Take 1 tablet (8 mg total) by mouth every 8 (eight) hours as needed for  nausea or vomiting.  Marland Kitchen rOPINIRole (REQUIP) 2 MG tablet TAKE 1 TABLET THREE TIMES DAILY  . sildenafil (VIAGRA) 100 MG tablet Take 0.5-1 tablets (50-100 mg total) by mouth daily as needed for erectile dysfunction.  . simvastatin (ZOCOR) 40 MG tablet Take 1 tablet (40 mg total) by mouth daily.  . traMADol (ULTRAM) 50 MG tablet   . XARELTO 20 MG TABS tablet TAKE 1 TABLET EVERY DAY  . [DISCONTINUED] LEVITRA 20 MG tablet Take 20 mg by mouth as needed.   . [DISCONTINUED] levofloxacin (LEVAQUIN) 500 MG tablet Take 1 tablet (500 mg total) by mouth daily.   No facility-administered encounter medications on file as of 08/02/2015.    PAST MEDICAL HISTORY:   Past Medical History  Diagnosis Date  . PD (Parkinson's disease) ([place])   . Hyperchloremia   . Plantar wart   . Arthritis of knee   . DVT (deep venous thrombosis) ([facility])     L leg  . Seasonal allergies   . TIA (transient ischemic attack)   . Nephrolithiasis   . DVT (deep venous thrombosis) ([place])   . Parkinson's disease ([place])   . Pulmonary embolism ([place])   . Allergy   . Arthritis     Osteoarthritis  . Parkinson's disease ([place])   . Hypercholesterolemia   . Hypercholesterolemia   . Hypertension     PAST SURGICAL HISTORY:   Past Surgical History  Procedure Laterality Date  . Knee surgery Right   . Tonsillectomy    . Appendectomy    . T&a    . Knee arthroscopy with meniscal repair Right 2009    SOCIAL HISTORY:   Social History   Social History  . Marital Status: Married    Spouse Name: N/A  . Number of Children: N/A  . Years of Education: N/A   Occupational History  . retired  machinest   Social History Main Topics  . Smoking status: Never Smoker   . Smokeless tobacco: Never Used  . Alcohol Use: No  . Drug Use: No  . Sexual Activity: Not on file   Other Topics Concern  . Not on file   Social History Narrative   ** Merged History Encounter **        FAMILY HISTORY:   Family Status  Relation Status Death  Age  . Father Deceased     PD  . Mother Deceased     CHF  . Sister Deceased     CHF  . Sister Deceased     breast cancer  . Sister Alive     heart disease    ROS:  A complete 10 system review of systems was obtained and was unremarkable apart from what is mentioned above.  PHYSICAL EXAMINATION:    VITALS:   Filed Vitals:   08/02/15 1023  BP: 120/82  Pulse: 60  Height: 5\' 10"  (1.778 m)  Weight: 262 lb (118.842 kg)    GEN:  The patient appears stated age and is in NAD. HEENT:  Normocephalic, atraumatic.  The mucous membranes are moist. The superficial temporal arteries are without ropiness or tenderness. CV:  RRR Lungs:  CTAB Neck/HEME:  There are no carotid bruits bilaterally.  The neck is slightly turned into sidebending to the right.  Neurological examination:  Orientation: The patient is alert and oriented x3. Cranial nerves: There is good facial symmetry.  Extraocular muscles are intact. The visual fields are full to confrontational testing. The speech is fluent and clear. Soft palate rises symmetrically and there is no tongue deviation. Hearing is intact to conversational tone. Sensation: Sensation is intact to light touch throughout Motor: Strength is 5/5 in the bilateral upper and lower extremities.   Shoulder shrug is equal and symmetric.  There is no pronator drift. DTR's:  3/4 at bilateral biceps, triceps, brachioradialis, patella (with cross adductor reflexes) and achilles.   Movement examination: Tone: There is moderate increased tone in the bilateral upper extremities Abnormal movements: None Coordination:  There is no decremation today with RAMs Gait and Station: The patient has no difficulty arising out of a deep-seated chair without the use of the hands. The patient's stride length is decreased with decreased arm swing and stooped posture.  He is more unsteady.    ASSESSMENT/PLAN:  1.  Idiopathic Parkinson's disease, diagnosed in October 2013 in  [place] with nonmotor symptoms that preceded this for several years.  Father had PD as well.    -continue ropinirole 2 mg 3 times a day because of cost.  Asked him to move last dose that he is taking at bedtime to dinnertime as he is getting RLS in the evening.  -Increased carbidopa/levodopa 25/100-2 tablets 3 times per day.  Risks, benefits, side effects and alternative therapies were discussed.  The opportunity to ask questions was given and they were answered to the best of my ability.  The patient expressed understanding and willingness to follow the outlined treatment protocols.  -I did discuss with the patient that it is likely that his Parkinson's symptoms got worse because of the addition of amiodarone.  I would like him to get a second opinion regarding the fact that he was told he needed lifetime amiodarone.  This may be the case, but I just want him to get a second opinion since he feels some much worse and clinically looks much worse from a Parkinson's standpoint,  he is on the amiodarone.  He degrees and will be referred to Dr. Stanford Breed.  -Has been more lightheaded and I asked him to keep a log of his blood pressure.  If it stays low or even within normal ranges, perhaps his lisinopril dose could be lowered (it was raised about a year ago). 2.  Constipation, associated with Parkinson's disease.  -Patient doing well with prunes, occasional miralax and has rancho recipe 3.  Hyperreflexia with neck discomfort  -This could be associated with Parkinson's disease and the patient had a history of a prior TIA, but given his neck pain and dystonia (also could be associated with Parkinson's), I wanted to do an MRI brain and c-spine.  I talked with him about this again today.  He is very hyperreflexic.  He just states that it is cost prohibitive and he doesn't want it done.  I told him to let me know if he changes his mind. 4.  REM behavior disorder  -not on meds.  Discussed in detail today.  Still  doesn't want meds.  Will let me know if changes mind 5.  F/u in next few months, after he follows up with the cardiologist.

## 2015-08-02 NOTE — Patient Instructions (Signed)
1. Appt scheduled with Dr Stanford Breed on 08/27/15 at 11:00 am. If this is not a good date/time please call [phone] to reschedule. 2. Increase Carbidopa Levodopa to 2 tablets 3 times daily.  3. Keep a blood pressure log at home.

## 2015-08-06 ENCOUNTER — Telehealth: Payer: Self-pay | Admitting: Cardiology

## 2015-08-06 NOTE — Telephone Encounter (Signed)
Received records from [place] for appointment on 08/27/15 with Dr Stanford Breed.  Records given to [facility] (medical records) for Dr Jacalyn Lefevre schedule on 08/27/15. lp

## 2015-08-06 NOTE — Telephone Encounter (Signed)
Spoke with pt wife, she wants to make sure we get the pt records prior to appointment on 08-27-15.

## 2015-08-06 NOTE — Telephone Encounter (Signed)
New message  'pt wife is calling to see if fax of pt medical records was received to [facility] from [place] office

## 2015-08-13 ENCOUNTER — Ambulatory Visit: Payer: Medicare PPO | Admitting: Occupational Therapy

## 2015-08-14 ENCOUNTER — Other Ambulatory Visit: Payer: Self-pay | Admitting: Family Medicine

## 2015-08-16 ENCOUNTER — Telehealth: Payer: Self-pay

## 2015-08-16 NOTE — Telephone Encounter (Signed)
I received a PA for patient's Simvastatin. This medication shouldn't need a PA. The reason [employer] is asking for one is because it's contradicting with a medication that Dr. [name] did not prescribe. Dr. [name] wants patient to come in for an appointment. It's been >6 months. I've tried calling patient to schedule an appointment. He has not answered. I left a message for him to return my phone call.  Could you reach out to patient to set up an appointment.

## 2015-08-17 ENCOUNTER — Telehealth: Payer: Self-pay

## 2015-08-17 ENCOUNTER — Telehealth: Payer: Self-pay | Admitting: Family Medicine

## 2015-08-17 NOTE — Telephone Encounter (Signed)
Thomas [name], can you please call and discuss this with the patient's wife, since you spoke with Dr.Johnson about it yesterday.

## 2015-08-17 NOTE — Telephone Encounter (Signed)
Pt's wife called stated she needs Dr. [name] to call [place] ASAP about a possible drug conflict between 2 medications he is currently taking. Please follow up with patient as well. Pt's wife stated they are not filling an RX for the pt until someone follows up with [place].

## 2015-08-17 NOTE — Telephone Encounter (Signed)
I've tried calling the patient to explain that we need to wait till his appointment with Dr. [name] because of the condradiction of medications. Dr. [name] did not prescribe one of those medications. He has an appointment 09/01/2015. This was my second attempt reaching out the the patient. They did not answer. I left a message for them to return my call.

## 2015-08-17 NOTE — Telephone Encounter (Signed)
Dr.Johnson, didn't you discuss this with someone yesterday?

## 2015-08-17 NOTE — Telephone Encounter (Signed)
Pt has a CPE scheduled 08/29/15 @ 10 am. Thanks.

## 2015-08-17 NOTE — Telephone Encounter (Signed)
Received a voicemail from patients wife wanting to know if patient would need surgical clearance for dental work or if [facility] could give her surgical clearance. Verbal from [place] stating that he could need to be seen for that to be done. Patients wife notified. Appointment scheduled.

## 2015-08-17 NOTE — Telephone Encounter (Signed)
I spoke to [facility] about this yesterday and thought they were calling him. He was started on amiodarone by an ER doctor. To continue on that medicine he needs to see cardiology. I need to know if he's seeing cardiology or not. He is also due for an appointment. I have never written the amiodarone, and he should have followed up with cardiology after the ER started him on it, but I don't see any notes unless he's seeing Dr. Fletcher Anon.

## 2015-08-20 ENCOUNTER — Encounter: Payer: Medicare PPO | Admitting: Occupational Therapy

## 2015-08-21 ENCOUNTER — Encounter: Payer: Medicare PPO | Admitting: Occupational Therapy

## 2015-08-21 NOTE — Progress Notes (Signed)
HPI: 65 yo male for evaluation of atrial fibrillation. H/O pulmonary embolus on xarelto. Labs 3/17 showed Hgb 12.8, K 3.7, BUN/Cr 18/1.15, TSH 2.090. Seen recently in ER for palpitations in setting of URI and cold medicine. Found to be in atrial fibrillation. He converted in ER. Patient given amiodarone at direction of Dr Chancy Milroy. Seen in FU by neurology and amiodarone felt to be exacerbating Parkinson's. Echocardiogram April 2017 in [place] showed biatrial enlargement, normal LV function, mild left ventricular hypertrophy, mild aortic and mitral regurgitation. Nuclear study April 2017 showed ejection fraction 68% and normal perfusion. Patient typically does not have dyspnea on exertion, orthopnea, PND, pedal edema, exertional chest pain or syncope. On the day he had his atrial fibrillation he noticed sudden onset of palpitations followed by discomfort in his back and arms bilaterally. This resolved when his atrial fibrillation converted to sinus rhythm. He has had no bouts since then. He has noticed that the amiodarone has exacerbated his Parkinson's.  Current Outpatient Prescriptions  Medication Sig Dispense Refill  . amiodarone (PACERONE) 200 MG tablet Take 1 tablet (200 mg total) by mouth 2 (two) times daily. 14 tablet 0  . aspirin 81 MG tablet Take 81 mg by mouth daily.    . carbidopa-levodopa (SINEMET IR) 25-100 MG tablet TAKE 1 AND 1/2 TABLETS THREE TIMES DAILY 405 tablet 1  . cetirizine (ZYRTEC) 10 MG tablet Take 10 mg by mouth daily.    . hydrochlorothiazide (MICROZIDE) 12.5 MG capsule TAKE 1 CAPSULE (12.5 MG TOTAL) BY MOUTH DAILY. 90 capsule 1  . L-Lysine 1000 MG TABS Take by mouth.    Marland Kitchen lisinopril (PRINIVIL,ZESTRIL) 30 MG tablet Take 1 tablet (30 mg total) by mouth daily. 90 tablet 1  . Melatonin 2.5 MG CAPS Take 1 capsule by mouth at bedtime as needed (for sleep).     . rivaroxaban (XARELTO) 20 MG TABS tablet Take 1 tablet (20 mg total) by mouth daily. 90 tablet 1  . rOPINIRole  (REQUIP) 2 MG tablet TAKE 1 TABLET THREE TIMES DAILY 270 tablet 1  . simvastatin (ZOCOR) 40 MG tablet TAKE 1 TABLET (40 MG TOTAL) BY MOUTH DAILY. 90 tablet 1  . traMADol (ULTRAM) 50 MG tablet      No current facility-administered medications for this visit.    Allergies  Allergen Reactions  . Codeine Other (See Comments)    Reaction: Hallucinations     Past Medical History  Diagnosis Date  . Hyperchloremia   . Plantar wart   . Arthritis of knee   . DVT (deep venous thrombosis) ([facility])     L leg  . Seasonal allergies   . TIA (transient ischemic attack)   . Nephrolithiasis   . Parkinson's disease ([place])   . Pulmonary embolism ([place])   . Allergy   . Arthritis     Osteoarthritis  . Hypercholesterolemia   . Hypertension   . Atrial fibrillation [facility])     Past Surgical History  Procedure Laterality Date  . Knee surgery Right   . Tonsillectomy    . Appendectomy    . T&a    . Knee arthroscopy with meniscal repair Right 2009    Social History   Social History  . Marital Status: Married    Spouse Name: N/A  . Number of Children: N/A  . Years of Education: N/A   Occupational History  . 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 Smoking status: Never Smoker   .  Smokeless tobacco: Never Used  . Alcohol Use: No  . Drug Use: No  . Sexual Activity: Not on file   Other Topics Concern  . Not on file   Social History Narrative   ** Merged History Encounter **        Family History  Problem Relation Age of Onset  . Coronary artery disease Mother   . Parkinson's disease Father   . Heart attack Sister   . Deep vein thrombosis Sister     ROS: no fevers or chills, productive cough, hemoptysis, dysphasia, odynophagia, melena, hematochezia, dysuria, hematuria, rash, seizure activity, orthopnea, PND, pedal edema, claudication. Remaining systems are negative.  Physical Exam:   Blood pressure 140/84, pulse 60, height 5\' 10"  (1.778 m), weight 262 lb (118.842  kg).  General:  Well developed/obese in NAD Skin warm/dry Patient not depressed No peripheral clubbing Back-normal HEENT-normal/normal eyelids Neck supple/normal carotid upstroke bilaterally; no bruits; no JVD; no thyromegaly chest - CTA/ normal expansion CV - RRR/normal S1 and S2; no murmurs, rubs or gallops;  PMI nondisplaced Abdomen -NT/ND, no HSM, no mass, + bowel sounds, no bruit 2+ femoral pulses, no bruits Ext-no edema, chords, 2+ DP Neuro-grossly nonfocal  ECG 07/06/15-atrial fibrillation with PVCs or aberrantly conducted beats; LVH; cannot R/O prior anterior MI  07/10/2015-sinus rhythm, left ventricular hypertrophy.

## 2015-08-22 ENCOUNTER — Encounter: Payer: Medicare PPO | Admitting: Occupational Therapy

## 2015-08-23 ENCOUNTER — Ambulatory Visit (INDEPENDENT_AMBULATORY_CARE_PROVIDER_SITE_OTHER): Payer: Medicare PPO | Admitting: Family Medicine

## 2015-08-23 ENCOUNTER — Encounter: Payer: Medicare PPO | Admitting: Occupational Therapy

## 2015-08-23 ENCOUNTER — Encounter: Payer: Self-pay | Admitting: Family Medicine

## 2015-08-23 VITALS — BP 136/85 | HR 59 | Temp 98.5°F | Ht 69.3 in | Wt 259.0 lb

## 2015-08-23 DIAGNOSIS — N521 Erectile dysfunction due to diseases classified elsewhere: Secondary | ICD-10-CM | POA: Diagnosis not present

## 2015-08-23 DIAGNOSIS — I4891 Unspecified atrial fibrillation: Secondary | ICD-10-CM | POA: Diagnosis not present

## 2015-08-23 DIAGNOSIS — I10 Essential (primary) hypertension: Secondary | ICD-10-CM

## 2015-08-23 DIAGNOSIS — E7800 Pure hypercholesterolemia, unspecified: Secondary | ICD-10-CM

## 2015-08-23 DIAGNOSIS — Z01818 Encounter for other preprocedural examination: Secondary | ICD-10-CM

## 2015-08-23 DIAGNOSIS — Z113 Encounter for screening for infections with a predominantly sexual mode of transmission: Secondary | ICD-10-CM | POA: Diagnosis not present

## 2015-08-23 MED ORDER — HYDROCHLOROTHIAZIDE 12.5 MG PO CAPS: ORAL_CAPSULE | ORAL | Status: DC

## 2015-08-23 MED ORDER — SIMVASTATIN 40 MG PO TABS: ORAL_TABLET | ORAL | Status: DC

## 2015-08-23 MED ORDER — RIVAROXABAN 20 MG PO TABS: 20.0000 mg | ORAL_TABLET | Freq: Every day | ORAL | Status: DC

## 2015-08-23 MED ORDER — LISINOPRIL 30 MG PO TABS: 30.0000 mg | ORAL_TABLET | Freq: Every day | ORAL | Status: DC

## 2015-08-23 NOTE — Assessment & Plan Note (Signed)
Not interested in medication at this time. Information about non-pharmaceutical options discussed. If he is interested, will get him into see urology.

## 2015-08-23 NOTE — Assessment & Plan Note (Signed)
In sinus rhythm today. Will follow up with cardiology. Will need cardiac clearance for surgery.

## 2015-08-23 NOTE — Assessment & Plan Note (Signed)
Checking labs today. Continue to monitor. No myalgias with the amiodarone. Will decrease dose pending labs. Await results.

## 2015-08-23 NOTE — Assessment & Plan Note (Signed)
Under good control. Continue current regimen. Checking labs today. Call with any concerns.

## 2015-08-23 NOTE — Progress Notes (Signed)
BP 136/85 mmHg  Pulse 59  Temp(Src) 98.5 F (36.9 C)  Ht 5' 9.3" (1.76 m)  Wt 259 lb (117.482 kg)  BMI 37.93 kg/m2  SpO2 97%   Subjective:    Patient ID: Thomas [name], male    DOB: 08-25-1950, 65 y.o.   MRN: [id number]  HPI: Thomas [name] is a 65 y.o. male  Chief Complaint  Patient presents with  . surgical clearance  . Prescription    Patient would like a prescription for a pneumonia vaccine   Thomas [name] presents today for clearance to have a tooth pulled. He notes that it is below the gum line and his dentist/oral surgeon thinks that he is going to need anesthesia to get it out. He is not sure if he is going to be intubated or how they are going to do the surgery. It's being done by Dr. Hampton Abbot in [facility]- [facility] Has never had problems with surgery in the past, last had surgery done in 2007. He does note though that he was a little nauseous afterwards last time. He will make sure to tell anesthesia this. Never had problems getting him off the vent.  He is generally feeling pretty good. He went to the ER and was started on amiodarone there previously. He has been following with Dr. Humphrey Rolls for cardiology, but is going on Monday to see Dr. Stanford Breed for a 2nd opinion. They are not sure who they are going to continue with right now. His neurologist is not happy about him being on the amiodarone and thinks that it has increased the symptoms from his parkinson's. He would like to come off or at least have other options. Will discuss this with Dr. Stanford Breed on Monday. Rate controlled at this time and already on xarelto.   HYPERTENSION / HYPERLIPIDEMIA Satisfied with current treatment? yes Duration of hypertension: chronic BP monitoring frequency: not checking BP medication side effects: no Duration of hyperlipidemia: chronic Cholesterol medication side effects: no Cholesterol supplements: none Medication compliance: excellent compliance Aspirin: yes Recent stressors: yes Recurrent  headaches: no Visual changes: no Palpitations: no Dyspnea: no Chest pain: no Lower extremity edema: no Dizzy/lightheaded: no  Found that he felt really weird with the viagra. Made him feel tight in the shoulders. Stopped taking it. Not interested in messing with anything right now.   Relevant past medical, surgical, family and social history reviewed and updated as indicated. Interim medical history since our last visit reviewed. Allergies and medications reviewed and updated.  Review of Systems  Constitutional: Negative.   HENT: Negative.   Eyes: Negative.   Respiratory: Negative.   Cardiovascular: Negative.   Gastrointestinal: Negative.   Endocrine: Negative.   Genitourinary: Negative.   Musculoskeletal: Negative.   Skin: Negative.   Allergic/Immunologic: Negative.   Neurological: Negative.   Hematological: Negative.   Psychiatric/Behavioral: Negative.     Per HPI unless specifically indicated above     Objective:    BP 136/85 mmHg  Pulse 59  Temp(Src) 98.5 F (36.9 C)  Ht 5' 9.3" (1.76 m)  Wt 259 lb (117.482 kg)  BMI 37.93 kg/m2  SpO2 97%  Wt Readings from Last 3 Encounters:  08/23/15 259 lb (117.482 kg)  08/02/15 262 lb (118.842 kg)  07/06/15 250 lb (113.399 kg)    Physical Exam  Constitutional: He is oriented to person, place, and time. He appears well-developed and well-nourished. No distress.  HENT:  Head: Normocephalic and atraumatic.  Right Ear: Hearing and external ear normal.  Left Ear: Hearing  and external ear normal.  Nose: Nose normal.  Mouth/Throat: Oropharynx is clear and moist. No oropharyngeal exudate.  Large tongue, mallampati III  Eyes: Conjunctivae, EOM and lids are normal. Pupils are equal, round, and reactive to light. Right eye exhibits no discharge. Left eye exhibits no discharge. No scleral icterus.  Neck: Normal range of motion. Neck supple. No JVD present. No tracheal deviation present. No thyromegaly present.  Cardiovascular:  Normal rate, regular rhythm, normal heart sounds and intact distal pulses.  Exam reveals no gallop and no friction rub.   No murmur heard. Pulmonary/Chest: Effort normal and breath sounds normal. No stridor. No respiratory distress. He has no wheezes. He has no rales. He exhibits no tenderness.  Abdominal: Soft. Bowel sounds are normal. He exhibits no distension and no mass. There is no tenderness. There is no rebound and no guarding.  Musculoskeletal: Normal range of motion. He exhibits no edema.  Lymphadenopathy:    He has no cervical adenopathy.  Neurological: He is alert and oriented to person, place, and time. He has normal reflexes. He displays normal reflexes. No cranial nerve deficit. He exhibits abnormal muscle tone (stiff). Coordination normal.  Skin: Skin is warm, dry and intact. No rash noted. He is not diaphoretic. No erythema. No pallor.  Psychiatric: He has a normal mood and affect. His speech is normal and behavior is normal. Judgment and thought content normal. Cognition and memory are normal.  Nursing note and vitals reviewed.   Results for orders placed or performed during the hospital encounter of 07/06/15  CBC with Differential  Result Value Ref Range   WBC 9.3 3.8 - 10.6 K/uL   RBC 4.34 (L) 4.40 - 5.90 MIL/uL   Hemoglobin 12.8 (L) 13.0 - 18.0 g/dL   HCT 38.1 (L) 40.0 - 52.0 %   MCV 87.8 80.0 - 100.0 fL   MCH 29.6 26.0 - 34.0 pg   MCHC 33.7 32.0 - 36.0 g/dL   RDW 12.9 11.5 - 14.5 %   Platelets 196 150 - 440 K/uL   Neutrophils Relative % 75 %   Neutro Abs 7.0 (H) 1.4 - 6.5 K/uL   Lymphocytes Relative 11 %   Lymphs Abs 1.0 1.0 - 3.6 K/uL   Monocytes Relative 8 %   Monocytes Absolute 0.8 0.2 - 1.0 K/uL   Eosinophils Relative 5 %   Eosinophils Absolute 0.4 0 - 0.7 K/uL   Basophils Relative 1 %   Basophils Absolute 0.1 0 - 0.1 K/uL  Brain natriuretic peptide  Result Value Ref Range   B Natriuretic Peptide 296.0 (H) 0.0 - 100.0 pg/mL  Troponin I  Result Value Ref  Range   Troponin I <0.03 <0.031 ng/mL  Basic metabolic panel  Result Value Ref Range   Sodium 137 135 - 145 mmol/L   Potassium 3.7 3.5 - 5.1 mmol/L   Chloride 105 101 - 111 mmol/L   CO2 25 22 - 32 mmol/L   Glucose, Bld 116 (H) 65 - 99 mg/dL   BUN 18 6 - 20 mg/dL   Creatinine, Ser 1.15 0.61 - 1.24 mg/dL   Calcium 8.9 8.9 - 10.3 mg/dL   GFR calc non Af Amer >60 >60 mL/min   GFR calc Af Amer >60 >60 mL/min   Anion gap 7 5 - 15  TSH  Result Value Ref Range   TSH 2.090 0.350 - 4.500 uIU/mL      Assessment & Plan:   Problem List Items Addressed This Visit  Cardiovascular and Mediastinum   Hypertension    Under good control. Continue current regimen. Checking labs today. Call with any concerns.       Relevant Medications   lisinopril (PRINIVIL,ZESTRIL) 30 MG tablet   hydrochlorothiazide (MICROZIDE) 12.5 MG capsule   simvastatin (ZOCOR) 40 MG tablet   rivaroxaban (XARELTO) 20 MG TABS tablet   Atrial fibrillation (HCC)    In sinus rhythm today. Will follow up with cardiology. Will need cardiac clearance for surgery.      Relevant Medications   lisinopril (PRINIVIL,ZESTRIL) 30 MG tablet   hydrochlorothiazide (MICROZIDE) 12.5 MG capsule   simvastatin (ZOCOR) 40 MG tablet   rivaroxaban (XARELTO) 20 MG TABS tablet   Other Relevant Orders   EKG 12-Lead   CBC with Differential/Platelet   Comprehensive metabolic panel   INR/PT     Genitourinary   Erectile dysfunction    Not interested in medication at this time. Information about non-pharmaceutical options discussed. If he is interested, will get him into see urology.         Other   Hypercholesterolemia    Checking labs today. Continue to monitor. No myalgias with the amiodarone. Will decrease dose pending labs. Await results.       Relevant Medications   lisinopril (PRINIVIL,ZESTRIL) 30 MG tablet   hydrochlorothiazide (MICROZIDE) 12.5 MG capsule   simvastatin (ZOCOR) 40 MG tablet   rivaroxaban (XARELTO) 20 MG  TABS tablet   Other Relevant Orders   Lipid Panel w/o Chol/HDL Ratio    Other Visit Diagnoses    Preop examination    -  Primary    Needs cardiac clearence and will get that. Pending lab results- should be cleared. Cardiology will have to decide if he can come off his xarelto.     Routine screening for STI (sexually transmitted infection)        Labs checked today, await results.     Relevant Orders    Hepatitis C antibody    CBC with Differential/Platelet    Comprehensive metabolic panel    INR/PT        Follow up plan: Return in about 6 months (around 02/23/2016) for Wellness.

## 2015-08-24 LAB — COMPREHENSIVE METABOLIC PANEL
ALBUMIN: 4 g/dL (ref 3.6–4.8)
ALK PHOS: 75 IU/L (ref 39–117)
ALT: 6 IU/L (ref 0–44)
AST: 7 IU/L (ref 0–40)
Albumin/Globulin Ratio: 1.4 (ref 1.2–2.2)
BILIRUBIN TOTAL: 0.5 mg/dL (ref 0.0–1.2)
BUN / CREAT RATIO: 17 (ref 10–24)
BUN: 21 mg/dL (ref 8–27)
CALCIUM: 9.2 mg/dL (ref 8.6–10.2)
CHLORIDE: 102 mmol/L (ref 96–106)
CO2: 22 mmol/L (ref 18–29)
Creatinine, Ser: 1.21 mg/dL (ref 0.76–1.27)
GFR calc non Af Amer: 62 mL/min/{1.73_m2} (ref >59)
GFR, EST AFRICAN AMERICAN: 72 mL/min/{1.73_m2} (ref >59)
GLOBULIN, TOTAL: 2.9 g/dL (ref 1.5–4.5)
Glucose: 101 mg/dL — ABNORMAL HIGH (ref 65–99)
Potassium: 4.2 mmol/L (ref 3.5–5.2)
SODIUM: 140 mmol/L (ref 134–144)
Total Protein: 6.9 g/dL (ref 6.0–8.5)

## 2015-08-24 LAB — LIPID PANEL W/O CHOL/HDL RATIO
Cholesterol, Total: 121 mg/dL (ref 100–199)
HDL: 45 mg/dL (ref >39)
LDL CALC: 67 mg/dL (ref 0–99)
Triglycerides: 47 mg/dL (ref 0–149)
VLDL Cholesterol Cal: 9 mg/dL (ref 5–40)

## 2015-08-24 LAB — CBC WITH DIFFERENTIAL/PLATELET
BASOS: 1 %
Basophils Absolute: 0 10*3/uL (ref 0.0–0.2)
EOS (ABSOLUTE): 0.1 10*3/uL (ref 0.0–0.4)
EOS: 1 %
HEMATOCRIT: 41.3 % (ref 37.5–51.0)
HEMOGLOBIN: 13.4 g/dL (ref 12.6–17.7)
IMMATURE GRANULOCYTES: 0 %
Immature Grans (Abs): 0 10*3/uL (ref 0.0–0.1)
LYMPHS ABS: 0.9 10*3/uL (ref 0.7–3.1)
Lymphs: 16 %
MCH: 29.5 pg (ref 26.6–33.0)
MCHC: 32.4 g/dL (ref 31.5–35.7)
MCV: 91 fL (ref 79–97)
MONOCYTES: 8 %
MONOS ABS: 0.4 10*3/uL (ref 0.1–0.9)
NEUTROS PCT: 74 %
Neutrophils Absolute: 4.2 10*3/uL (ref 1.4–7.0)
Platelets: 206 10*3/uL (ref 150–379)
RBC: 4.54 x10E6/uL (ref 4.14–5.80)
RDW: 14.4 % (ref 12.3–15.4)
WBC: 5.7 10*3/uL (ref 3.4–10.8)

## 2015-08-24 LAB — PROTIME-INR
INR: 1.1 (ref 0.8–1.2)
Prothrombin Time: 11.4 s (ref 9.1–12.0)

## 2015-08-24 LAB — HEPATITIS C ANTIBODY: Hep C Virus Ab: 0.1 s/co ratio (ref 0.0–0.9)

## 2015-08-27 ENCOUNTER — Encounter: Payer: Self-pay | Admitting: Cardiology

## 2015-08-27 ENCOUNTER — Encounter: Payer: Medicare PPO | Admitting: Occupational Therapy

## 2015-08-27 ENCOUNTER — Telehealth: Payer: Self-pay | Admitting: Cardiology

## 2015-08-27 ENCOUNTER — Ambulatory Visit (INDEPENDENT_AMBULATORY_CARE_PROVIDER_SITE_OTHER): Payer: Medicare PPO | Admitting: Cardiology

## 2015-08-27 ENCOUNTER — Encounter: Payer: Self-pay | Admitting: Unknown Physician Specialty

## 2015-08-27 VITALS — BP 140/84 | HR 60 | Ht 70.0 in | Wt 262.0 lb

## 2015-08-27 DIAGNOSIS — E7800 Pure hypercholesterolemia, unspecified: Secondary | ICD-10-CM

## 2015-08-27 DIAGNOSIS — I480 Paroxysmal atrial fibrillation: Secondary | ICD-10-CM | POA: Diagnosis not present

## 2015-08-27 MED ORDER — METOPROLOL SUCCINATE ER 25 MG PO TB24: 25.0000 mg | ORAL_TABLET | Freq: Every day | ORAL | Status: DC

## 2015-08-27 NOTE — Patient Instructions (Addendum)
Medication Instructions:   STOP AMIODARONE  STOP ASPIRIN  STOP HCTZ  START METOPROLOL SUCC ER 25 MG ONCE DAILY  Follow-Up:  Your physician recommends that you schedule a follow-up appointment in: [place]

## 2015-08-27 NOTE — Telephone Encounter (Signed)
will fax this note to the number provided 

## 2015-08-27 NOTE — Assessment & Plan Note (Signed)
Given that we are adding Toprol I would discontinue hydrochlorothiazide.Follow blood pressure and pulse and adjust regimen as needed.

## 2015-08-27 NOTE — Assessment & Plan Note (Signed)
Continue statin. 

## 2015-08-27 NOTE — Telephone Encounter (Signed)
New message     What dental office are you calling from? [facility])  1. What is your office phone and fax number? Pt wife calling uncertain of phone number  2. What type of procedure is the patient having performed? Tooth exactraction  3. What date is procedure scheduled? pending  4. What is your question (ex. Antibiotics prior to procedure, holding medication-we need to know how long dentist wants pt to hold med)? The wife to the pt is calling how long to hold the Xarelto.  The pt wife needs to know how long the pt needs to be off the blood thinner.

## 2015-08-27 NOTE — Assessment & Plan Note (Signed)
Continue xarelto. Given need for anticoagulation long-term would discontinue aspirin.

## 2015-08-27 NOTE — Telephone Encounter (Signed)
Returned call to pt's wife, ok per DPR.  Told her this will be routed to Dr Stanford Breed and once we receive a response we will let her know.  Fax number for Dr Allen Kell office is [phone];  Phone number is [phone].  Will route to Dr Stanford Breed and Fredia Beets.

## 2015-08-27 NOTE — Assessment & Plan Note (Signed)
Patient had one bout of atrial fibrillation. This occurred in the setting of an upper respiratory infection with associated medications. He converted in the emergency room. He was placed on amiodarone but this appears to be exacerbating his Parkinson's. It is also not clear that he will have recurrent atrial fibrillation at this point. We will discontinue amiodarone. If he has more frequent episodes in the future he would need a different antiarrhythmic such as tikosyn. He has embolic risk factors of age greater than 79, prior TIA, hypertension. CHADSvasc 4. Continue xarelto. Note recent TSH normal. The patient does have borderline heart rate when in sinus rhythm. However he was at 165 when in atrial fibrillation. I will add low-dose Toprol 25 mg for rate control if his atrial fibrillation recurs. This can be advanced as amiodarone washes out of his system. He lives in [place] and I will arrange follow-up with Dr. Rockey Situ at his request.

## 2015-08-27 NOTE — Telephone Encounter (Signed)
Hold xarelto 2 days prior to procedure and resume day after; no SBE prophylaxis. Kirk Ruths

## 2015-08-28 ENCOUNTER — Other Ambulatory Visit: Payer: Self-pay | Admitting: Cardiology

## 2015-08-28 ENCOUNTER — Encounter: Payer: Medicare PPO | Admitting: Occupational Therapy

## 2015-08-28 MED ORDER — METOPROLOL SUCCINATE ER 25 MG PO TB24: 25.0000 mg | ORAL_TABLET | Freq: Every day | ORAL | Status: DC

## 2015-08-28 NOTE — Telephone Encounter (Signed)
error 

## 2015-08-28 NOTE — Telephone Encounter (Signed)
Rx(s) sent to pharmacy electronically.  

## 2015-08-28 NOTE — Telephone Encounter (Signed)
°*  STAT* If patient is at the pharmacy, call can be transferred to refill team.   1. Which medications need to be refilled? (please list name of each medication and dose if known) Metoprolol-please call this asap,he needs to start taking them today-It was supposed to have gone here yesterday,until he mail order comes 2. Which pharmacy/location (including street and city if local pharmacy) is medication to be sent to?[facility]-[phone]  3. Do they need a 30 day or 90 day supply? [place]

## 2015-08-29 ENCOUNTER — Telehealth: Payer: Self-pay | Admitting: Family Medicine

## 2015-08-29 ENCOUNTER — Telehealth: Payer: Self-pay

## 2015-08-29 ENCOUNTER — Encounter: Payer: Medicare PPO | Admitting: Family Medicine

## 2015-08-29 ENCOUNTER — Telehealth: Payer: Self-pay | Admitting: Cardiology

## 2015-08-29 NOTE — Telephone Encounter (Signed)
Pharmacy notified.

## 2015-08-29 NOTE — Telephone Encounter (Signed)
Pharm got our request for Simvastatin, They also have an active rx for Rosuvastatin 20mg . Which is he supposed to be taking? [phone] Ref # [id number]

## 2015-08-29 NOTE — Telephone Encounter (Signed)
Pending cardiology approval, should be fine for surgery after reviewing labs.

## 2015-08-29 NOTE — Telephone Encounter (Signed)
He should be on rousvastatin, I think that was started by cardiology. They can stop the simvastatin as long as the patient is taking the crestor

## 2015-08-29 NOTE — Telephone Encounter (Signed)
New message      Pt c/o BP issue: STAT if pt c/o blurred vision, one-sided weakness or slurred speech  1. What are your last 5 BP readings? 171/84 6am, 138/77 HR 9am 2. Are you having any other symptoms (ex. Dizziness, headache, blurred vision, passed out)? no 3. What is your BP issue? Pt's bp is still high.  He took his metoprolol last night at 10pm.  Please advise

## 2015-08-29 NOTE — Telephone Encounter (Signed)
Spoke with pt wife, he just took the first dose of metoprolol last night. He reports he feels better than he has. They will continue to monitor bp and let us know if continues to trend high. Wife also reports blood sugar 101 this morning. Side effect of metoprolol is elevated glucose, she will monitor this and let us know.

## 2015-08-31 ENCOUNTER — Encounter: Payer: Medicare PPO | Admitting: Occupational Therapy

## 2015-09-03 ENCOUNTER — Encounter: Payer: Medicare PPO | Admitting: Occupational Therapy

## 2015-09-04 ENCOUNTER — Encounter: Payer: Medicare PPO | Admitting: Occupational Therapy

## 2015-09-04 ENCOUNTER — Telehealth: Payer: Self-pay | Admitting: Family Medicine

## 2015-09-04 NOTE — Telephone Encounter (Signed)
Called and spoke to patient's wife. Wife states that the patient has always been taking simvastatin and they just got rosuvastatin through the mail order. She would like to know which medication he is supposed to be taking.

## 2015-09-04 NOTE — Telephone Encounter (Signed)
OK for him to take the simvastatin.

## 2015-09-04 NOTE — Telephone Encounter (Signed)
There is a concern with pt's medication. Stated they received a different Cholesterol medication than what he has been taking. Please call [name] ASAP regarding this issue. Thanks.

## 2015-09-04 NOTE — Telephone Encounter (Signed)
Patient's wife notified about medication.

## 2015-09-04 NOTE — Telephone Encounter (Signed)
Called and spoke to wife. She states that [facility] told her that she called [place] 08/29/15 and we told her them that he should be taking the rosuvastatin, which is not in med list. There is a phone note from 08/29/15 in patient's chart about this.

## 2015-09-04 NOTE — Telephone Encounter (Signed)
Called pharmacy to clarify about medication. I told them that they patient was not supposed to be taking the rosuvastatin and I asked if they had the prescription for the simvastatin. They sad they did not so I called it into the pharmacy for them. Will call the patient's wife and let her know.

## 2015-09-04 NOTE — Telephone Encounter (Signed)
Thomas [name], I don't see that he ever got a Rx for crestor... Did it come from a cardiologist or another doctor? I only see the simvastatin that I wrote.

## 2015-09-05 ENCOUNTER — Encounter: Payer: Medicare PPO | Admitting: Occupational Therapy

## 2015-09-06 ENCOUNTER — Encounter: Payer: Medicare PPO | Admitting: Occupational Therapy

## 2015-09-11 ENCOUNTER — Encounter: Payer: Medicare PPO | Admitting: Occupational Therapy

## 2015-09-12 ENCOUNTER — Encounter: Payer: Medicare PPO | Admitting: Occupational Therapy

## 2015-09-13 ENCOUNTER — Encounter: Payer: Medicare PPO | Admitting: Occupational Therapy

## 2015-09-14 ENCOUNTER — Encounter: Payer: Medicare PPO | Admitting: Occupational Therapy

## 2015-09-17 ENCOUNTER — Encounter: Payer: Medicare PPO | Admitting: Occupational Therapy

## 2015-10-29 ENCOUNTER — Encounter: Payer: Self-pay | Admitting: Cardiovascular Disease

## 2015-10-29 ENCOUNTER — Ambulatory Visit (INDEPENDENT_AMBULATORY_CARE_PROVIDER_SITE_OTHER): Payer: Medicare PPO | Admitting: Cardiovascular Disease

## 2015-10-29 VITALS — BP 147/90 | HR 51 | Ht 70.0 in | Wt 262.0 lb

## 2015-10-29 DIAGNOSIS — N521 Erectile dysfunction due to diseases classified elsewhere: Secondary | ICD-10-CM

## 2015-10-29 DIAGNOSIS — I2782 Chronic pulmonary embolism: Secondary | ICD-10-CM

## 2015-10-29 DIAGNOSIS — I10 Essential (primary) hypertension: Secondary | ICD-10-CM

## 2015-10-29 DIAGNOSIS — E7800 Pure hypercholesterolemia, unspecified: Secondary | ICD-10-CM | POA: Diagnosis not present

## 2015-10-29 DIAGNOSIS — I82402 Acute embolism and thrombosis of unspecified deep veins of left lower extremity: Secondary | ICD-10-CM | POA: Diagnosis not present

## 2015-10-29 DIAGNOSIS — G20 Parkinson's disease: Secondary | ICD-10-CM

## 2015-10-29 DIAGNOSIS — I480 Paroxysmal atrial fibrillation: Secondary | ICD-10-CM

## 2015-10-29 MED ORDER — SILDENAFIL CITRATE 20 MG PO TABS: 20.0000 mg | ORAL_TABLET | Freq: Three times a day (TID) | ORAL | Status: DC | PRN

## 2015-10-29 NOTE — Patient Instructions (Signed)
Medication Instructions:   Try revatio 3 to 5 pills  As needed (60 to 100 mg) Pills are 20 mg each  Labwork:  No new labs  Testing/Procedures:  No further testing   Follow-Up: It was a pleasure seeing you in the office today. Please call us if you have new issues that need to be addressed before your next appt.  [phone]  Your physician wants you to follow-up in: 6 months.  You will receive a reminder letter in the mail two months in advance. If you don't receive a letter, please call our office to schedule the follow-up appointment.  If you need a refill on your cardiac medications before your next appointment, please call your pharmacy.

## 2015-10-29 NOTE — Progress Notes (Signed)
Patient ID: Thomas [name], male   DOB: [birth year], 66 y.o.   MRN: [id number] Cardiology Office Note  Date:  10/29/2015   ID:  Thomas [name], DOB 06-06-1950, MRN [id number]  PCP:  Park Liter, DO   Chief Complaint  Patient presents with  . other    HPI:  65 yo male with a history of Parkinson's, paroxysmal atrial fibrillation,  H/O pulmonary embolus on xarelto, who presents for follow-up of his atrial fibrillation and establish care in the [facility] office No prior smoking history, no diabetes, cholesterol well controlled on a statin  In follow-up today, he is exercising 3 days per week, overall feels well with no complaints. No significant shortness of breath on exertion, no leg edema  Previous records reviewed with him including history of atrial fibrillation, PE He did not do well on amiodarone, felt this made his tremor worse Some improvement in his symptoms, still not back to baseline He is followed by neurology in [place], Dr. Carles Collet He denies any tachycardia or palpitations concerning for atrial fibrillation  Previously seen in the emergency room in the setting of upper respiratory infection, found to be in atrial fibrillation, converted in the emergency room after he was given amiodarone  EKG on today's visit shows sinus bradycardia with rate 51 bpm, no significant ST or T-wave changes  He did have echocardiogram and stress test as detailed below   Labs 3/17 showed Hgb 12.8, K 3.7, BUN/Cr 18/1.15, TSH 2.090.   Other past medical history Echocardiogram April 2017 in [place] showed biatrial enlargement, normal LV function, mild left ventricular hypertrophy, mild aortic and mitral regurgitation.   Nuclear study April 2017 showed ejection fraction 68% and normal perfusion.   On the day he had his atrial fibrillation he noticed sudden onset of palpitations followed by discomfort in his back and arms bilaterally. This resolved when his atrial fibrillation converted to sinus  rhythm. He has had no bouts since then. He has noticed that the amiodarone has exacerbated his Parkinson's.  PMH:   has a past medical history of Hyperchloremia; Plantar wart; Arthritis of knee; DVT (deep venous thrombosis) ([place]); Seasonal allergies; TIA (transient ischemic attack); Nephrolithiasis; Parkinson's disease ([place]); Pulmonary embolism ([place]); Allergy; Arthritis; Hypercholesterolemia; Hypertension; and Atrial fibrillation ([place]).  PSH:    Past Surgical History  Procedure Laterality Date  . Knee surgery Right   . Tonsillectomy    . Appendectomy    . T&a    . Knee arthroscopy with meniscal repair Right 2009    Current Outpatient Prescriptions  Medication Sig Dispense Refill  . carbidopa-levodopa (SINEMET IR) 25-100 MG tablet TAKE 1 AND 1/2 TABLETS THREE TIMES DAILY 405 tablet 1  . cetirizine (ZYRTEC) 10 MG tablet Take 10 mg by mouth daily.    Marland Kitchen L-Lysine 1000 MG TABS Take by mouth.    Marland Kitchen lisinopril (PRINIVIL,ZESTRIL) 30 MG tablet Take 1 tablet (30 mg total) by mouth daily. 90 tablet 1  . Melatonin 2.5 MG CAPS Take 1 capsule by mouth at bedtime as needed (for sleep).     . metoprolol succinate (TOPROL XL) 25 MG 24 hr tablet Take 1 tablet (25 mg total) by mouth daily. 30 tablet 0  . rivaroxaban (XARELTO) 20 MG TABS tablet Take 1 tablet (20 mg total) by mouth daily. 90 tablet 1  . rOPINIRole (REQUIP) 2 MG tablet TAKE 1 TABLET THREE TIMES DAILY 270 tablet 1  . simvastatin (ZOCOR) 40 MG tablet TAKE 1 TABLET (40 MG TOTAL) BY MOUTH DAILY. 90 tablet 1  .  traMADol (ULTRAM) 50 MG tablet     . sildenafil (REVATIO) 20 MG tablet Take 1 tablet (20 mg total) by mouth 3 (three) times daily as needed. 90 tablet 6   No current facility-administered medications for this visit.     Allergies:   Codeine   Social History:  The patient  reports that he has never smoked. He has never used smokeless tobacco. He reports that he does not drink alcohol or use illicit drugs.   Family History:   family  history includes Coronary artery disease in his mother; Deep vein thrombosis in his sister; Heart attack in his sister; Parkinson's disease in his father.    Review of Systems: Review of Systems  Constitutional: Negative.   Respiratory: Negative.   Cardiovascular: Negative.   Gastrointestinal: Negative.   Musculoskeletal: Negative.   Neurological: Negative.   Psychiatric/Behavioral: Negative.   All other systems reviewed and are negative.    PHYSICAL EXAM: VS:  BP 147/90 mmHg  Pulse 51  Ht 5\' 10"  (1.778 m)  Wt 262 lb (118.842 kg)  BMI 37.59 kg/m2 , BMI Body mass index is 37.59 kg/(m^2). GEN: Well nourished, well developed, in no acute distress HEENT: normal Neck: no JVD, carotid bruits, or masses Cardiac: RRR; no murmurs, rubs, or gallops,no edema  Respiratory:  clear to auscultation bilaterally, normal work of breathing GI: soft, nontender, nondistended, + BS MS: no deformity or atrophy Skin: warm and dry, no rash Neuro:  Strength and sensation are intact Psych: euthymic mood, full affect    Recent Labs: 07/06/2015: B Natriuretic Peptide 296.0*; Hemoglobin 12.8*; TSH 2.090 08/23/2015: ALT 6; BUN 21; Creatinine, Ser 1.21; Platelets 206; Potassium 4.2; Sodium 140    Lipid Panel Lab Results  Component Value Date   CHOL 121 08/23/2015   HDL 45 08/23/2015   LDLCALC 67 08/23/2015   TRIG 47 08/23/2015      Wt Readings from Last 3 Encounters:  10/29/15 262 lb (118.842 kg)  08/27/15 262 lb (118.842 kg)  08/23/15 259 lb (117.482 kg)       ASSESSMENT AND PLAN:  Paroxysmal atrial fibrillation (HCC) - Plan: EKG 12-Lead Maintaining normal sinus rhythm. No medication changes made. Tolerating anticoagulation  Hypercholesterolemia Cholesterol is at goal on the current lipid regimen. No changes to the medications were made.  Essential hypertension Blood pressure initially elevated, dramatically improved on recheck . No changes made to the medications. Blood pressures  from home reviewed,  sometimes low, rarely less than 100 Denies orthostasis symptoms  Deep vein thrombosis (DVT) of left lower extremity, unspecified chronicity, unspecified vein (HCC) Previous ultrasound in 2016 showing mild improvement of a nonocclusive clot left popliteal vein  No indication for further workup  On chronic anticoagulation   Other chronic pulmonary embolism (HCC) On chronic anticoagulation as above   Parkinson's disease ([place]) Managed by neurology, exercising 3 days per week  Recommended weight loss to low carbohydrate diet, increasing exercise regiment   Erectile dysfunction due to diseases classified elsewhere  Long discussion concerning various treatment options, After further discussion, we have provided prescription for revatio.   Total encounter time more than 25 minutes  Greater than 50% was spent in counseling and coordination of care with the patient   Disposition:   F/U  12 months   Orders Placed This Encounter  Procedures  . EKG 12-Lead     Signed, Esmond Plants, M.D., Ph.D. 10/29/2015  [place], [place]

## 2015-10-30 ENCOUNTER — Ambulatory Visit: Payer: Medicare PPO | Admitting: Neurology

## 2015-10-30 ENCOUNTER — Encounter: Payer: Self-pay | Admitting: Hematology and Oncology

## 2015-11-01 ENCOUNTER — Encounter: Payer: Self-pay | Admitting: Hematology and Oncology

## 2015-11-01 ENCOUNTER — Inpatient Hospital Stay: Payer: Medicare PPO | Attending: Hematology and Oncology | Admitting: Hematology and Oncology

## 2015-11-01 ENCOUNTER — Ambulatory Visit: Payer: Medicare PPO | Admitting: Neurology

## 2015-11-01 VITALS — BP 129/87 | HR 65 | Temp 97.7°F | Resp 18 | Wt 263.3 lb

## 2015-11-01 DIAGNOSIS — M1990 Unspecified osteoarthritis, unspecified site: Secondary | ICD-10-CM | POA: Insufficient documentation

## 2015-11-01 DIAGNOSIS — I10 Essential (primary) hypertension: Secondary | ICD-10-CM | POA: Diagnosis not present

## 2015-11-01 DIAGNOSIS — E7800 Pure hypercholesterolemia, unspecified: Secondary | ICD-10-CM | POA: Insufficient documentation

## 2015-11-01 DIAGNOSIS — Z7901 Long term (current) use of anticoagulants: Secondary | ICD-10-CM | POA: Diagnosis not present

## 2015-11-01 DIAGNOSIS — Z86718 Personal history of other venous thrombosis and embolism: Secondary | ICD-10-CM | POA: Diagnosis not present

## 2015-11-01 DIAGNOSIS — E878 Other disorders of electrolyte and fluid balance, not elsewhere classified: Secondary | ICD-10-CM | POA: Insufficient documentation

## 2015-11-01 DIAGNOSIS — Z87820 Personal history of traumatic brain injury: Secondary | ICD-10-CM | POA: Diagnosis not present

## 2015-11-01 DIAGNOSIS — I82502 Chronic embolism and thrombosis of unspecified deep veins of left lower extremity: Secondary | ICD-10-CM

## 2015-11-01 DIAGNOSIS — Z86711 Personal history of pulmonary embolism: Secondary | ICD-10-CM

## 2015-11-01 DIAGNOSIS — G20 Parkinson's disease: Secondary | ICD-10-CM

## 2015-11-01 DIAGNOSIS — I4891 Unspecified atrial fibrillation: Secondary | ICD-10-CM | POA: Diagnosis not present

## 2015-11-01 DIAGNOSIS — Z79899 Other long term (current) drug therapy: Secondary | ICD-10-CM | POA: Diagnosis not present

## 2015-11-01 DIAGNOSIS — I2782 Chronic pulmonary embolism: Secondary | ICD-10-CM

## 2015-11-01 NOTE — Progress Notes (Signed)
Patient is here today for follow up, no complaints today  

## 2015-11-01 NOTE — Progress Notes (Signed)
[facility] day:  11/01/2015  Chief Complaint: Thomas [name] is a 65 y.o. male with a distant history of cerebrovascular accident and a recent history of deep venous thrombosis and pulmonary embolism who is seen for 6 month assessment on Xarelto.  HPI: The patient was last seen in the medical oncology clinic on 05/04/2015.  At that time, he was struggling with his Parkinson's secondary to decreased exercise post plantar wart procedure.  He had been unable to exercise at [employer].  Exam was unremarkable.  During the interim, he was diagnosed with pneumonia in 06/2015.  He went into atrial fibrillation while hospitalized.  He was initially put on amiodarone then switched to metoprolol.  He is now going back to the gym 3 times a week.   Past Medical History  Diagnosis Date  . Hyperchloremia   . Plantar wart   . Arthritis of knee   . DVT (deep venous thrombosis) (HCC)     L leg  . Seasonal allergies   . TIA (transient ischemic attack)   . Nephrolithiasis   . Parkinson's disease ([place])   . Pulmonary embolism ([place])   . Allergy   . Arthritis     Osteoarthritis  . Hypercholesterolemia   . Hypertension   . Atrial fibrillation [facility])     Past Surgical History  Procedure Laterality Date  . Knee surgery Right   . Tonsillectomy    . Appendectomy    . T&a    . Knee arthroscopy with meniscal repair Right 2009    Family History  Problem Relation Age of Onset  . Coronary artery disease Mother   . Parkinson's disease Father   . Heart attack Sister   . Deep vein thrombosis Sister     Social History:  reports that he has never smoked. He has never used smokeless tobacco. He reports that he does not drink alcohol or use illicit drugs.  He is back at the gym 3 times a week.  The patient is accompanied by his wife, [name], today.  Allergies:  Allergies  Allergen Reactions  . Codeine Other (See Comments)    Reaction: Hallucinations    Current Medications: Current Outpatient Prescriptions  Medication Sig Dispense Refill  . carbidopa-levodopa (SINEMET IR) 25-100 MG tablet TAKE 1 AND 1/2 TABLETS THREE TIMES DAILY 405 tablet 1  . cetirizine (ZYRTEC) 10 MG tablet Take 10 mg by mouth daily.    Marland Kitchen lisinopril (PRINIVIL,ZESTRIL) 30 MG tablet Take 1 tablet (30 mg total) by mouth daily. 90 tablet 1  . Melatonin 2.5 MG CAPS Take 1 capsule by mouth at bedtime as needed (for sleep).     . metoprolol succinate (TOPROL XL) 25 MG 24 hr tablet Take 1 tablet (25 mg total) by mouth daily. 30 tablet 0  . rivaroxaban (XARELTO) 20 MG TABS tablet Take 1 tablet (20 mg total) by mouth daily. 90 tablet 1  . rOPINIRole (REQUIP) 2 MG tablet TAKE 1 TABLET THREE TIMES DAILY 270 tablet 1  . sildenafil (REVATIO) 20 MG tablet Take 1 tablet (20 mg total) by mouth 3 (three) times daily as needed. 90 tablet 6  . simvastatin (ZOCOR) 40 MG tablet TAKE 1 TABLET (40 MG TOTAL) BY MOUTH DAILY. 90 tablet 1  . traMADol (ULTRAM) 50 MG tablet      No current facility-administered medications for this visit.   Review of Systems:  GENERAL:  Feels "ok".  More active.  No fevers, sweats or weight  loss. PERFORMANCE STATUS (ECOG):  1-2 HEENT:  No visual changes, runny nose, sore throat, mouth sores or tenderness. Lungs: No shortness of breath or cough.  No hemoptysis. Cardiac:  No chest pain, palpitations, orthopnea, or PND. GI:  No nausea, vomiting, diarrhea, constipation, melena or hematochezia. GU:  No urgency, frequency, dysuria, or hematuria. Musculoskeletal:  Knee problem (chronic).  Low back issues.  No muscle tenderness. Extremities:  No pain or swelling. Skin:  Wart gone. No rashes or skin changes. Neuro:  Parkinson's.  No headache, numbness or weakness, balance or coordination issues. Endocrine:  No diabetes, thyroid issues, hot flashes or night sweats. Psych:  Restless sleep.  No mood changes, depression or anxiety. Pain:  No focal pain. Review of  systems:  All other systems reviewed and found to be negative.  Physical Exam: Blood pressure 129/87, pulse 65, temperature 97.7 F (36.5 C), temperature source Tympanic, resp. rate 18, weight 263 lb 5.4 oz (119.45 kg). GENERAL:  Well developed, well nourished, sitting comfortably in the exam room in no acute distress. MENTAL STATUS:  Alert and oriented to person, place and time. HEAD:  Pearline Cables short hair.  Normocephalic, atraumatic, face symmetric, no Cushingoid features. EYES:  Glasses.  Blue eyes.  Pupils equal round and reactive to light and accomodation.  No conjunctivitis or scleral icterus. ENT:  Oropharynx clear without lesion.  Tongue normal. Mucous membranes moist.  RESPIRATORY:  Clear to auscultation without rales, wheezes or rhonchi. CARDIOVASCULAR:  Regular rate and rhythm without murmur, rub or gallop. ABDOMEN:  Soft, non-tender, with active bowel sounds, and no hepatosplenomegaly.  No masses. SKIN:  No rashes, ulcers or lesions. EXTREMITIES:  Slight left lower extremity edema.  No skin discoloration or tenderness.  No palpable cords. LYMPH NODES: No palpable cervical, supraclavicular, axillary or inguinal adenopathy  NEUROLOGICAL: Unremarkable. PSYCH:  Appropriate.   [facility] Labs:  CBC on 12/26/2014 revealed a hematocrit of 43.1, hemoglobin 14.6, MCV 89, platelets 195,000, white count 6200 with an ANC of 4100.  Comprehensive metabolic panel was normal with a creatinine of 1.15.  PSA was 7.5 (high) with a repeat value of 2.3 on 09/26/2014.  CBC on 03/01/2015 revealed a hematocrit of 40.8, hemoglobin 13.8, MCV 88, platelets 177,000, white count 6800 with an ANC of 4700. Comprehensive  metabolic panel was normal with a creatinine of 1.07.  CBC on 10/30/2015 revealed a hematocrit of 43.1, hemoglobin 14.4, MCV 89, platelets 187,000, white count 5400 with an ANC of 3500. D dimers were 0.50 (0-0.49).  Assessment:  Thomas [name] is a 65 y.o. male with a history of a cerebral vascular  accident in 2007 and a history of deep venous thrombosis and pulmonary embolism on 12/07/2013.  There were no precipitating events to his PE and DVT.  He was placed on Xarelto.  Laboratory testing on 05/19/2014 revealed negative Factor V Leiden, beta-2 glycoprotein, and anticardiolipin antibodies. Protein S activity and antigen, and antithrombin III activity were normal.  Additional testing on 06/14/2014 revealed a normal CBC, CMP, protein C antigen and activity, prothrombin gene mutation, and lupus anticoagulant panel.  D-dimers were elevated indicating active clot breakdown.  Lower extremity duplex on 06/23/2014 revealed persistent partially occlusive thrombus in the left posterior tibial, popliteal, and mid femoral veins.   Left lower extremity duplex on 01/02/2015 revealed persistent but improving nonocclusive partial thrombus in the femoral and popliteal veins.  Calf veins were patient.  Chest CT on 06/23/2014 revealed no pulmonary nodules or adenopathy.  PSA was normal on 09/26/2014.  He has been on Xarelto since 11/2013.  He denies any bruising or bleeding.   Symptomatically, he doing well.  He is back exercising at [employer].  Exam is unremarkable.  Plan: 1. Review labs from 10/30/2015. 2. [facility] slip. 3. Continue Xarelto.  4. Return to clinic in 6 months for MD assessment and review of outside labs.   Lequita Asal, MD  11/01/2015, 2:33 PM

## 2015-11-05 ENCOUNTER — Other Ambulatory Visit: Payer: Self-pay | Admitting: Neurology

## 2015-11-05 NOTE — Telephone Encounter (Signed)
Carbidopa Levodopa and Requip refill requested. Per last office note- patient to remain on medication. Refill approved and sent to patient's pharmacy.

## 2015-11-13 ENCOUNTER — Telehealth: Payer: Self-pay | Admitting: Neurology

## 2015-11-22 NOTE — Progress Notes (Signed)
Thomas [name] was seen today in the movement disorders clinic for neurologic consultation at the request of Park Liter, DO.  The consultation is for the evaluation of PD.  He is accompanied by his wife who supplements the history.  The patient has seen [name] at [facility] and I do have those notes.  The patient was dx with PD in Oct 2013 but first sx's probably started in the summer of 2013 with slowness of movement, R greater than L.  He does state that non-motor sx's like constipation occurred several years prior to this.  He was in [place] at the time.  He was initially started on Azilect in October 2013 and about 6 months later requip was added.  He was started initially at requip XL 2 mg and has worked up to requip XL 6 mg (the last increase was 10/2013).  He noticed a benefit with the requip but the XL is very expensive and asks about that.  He initially had some mental foginess with requip but that is better. Also asks about azilect as hasn't seen a benefit and is costing him $300.  Levodopa was then added by Dr. [name] in 03/2014.  It helped with cramping, rigidity.  He takes it at 4 am (but then goes back to sleep until 6am), 12pm, 8pm (bed at 8:30pm). He exercises faithfully.  He has neck turning to the right and has since the dx.  Some pain.     Specific Other Symptoms:  Tremor: Yes.  , only with pushing/pulling something or with stress Voice: weaker than in the past Sleep: sleeps well  Vivid Dreams:  Yes.    Acting out dreams:  Yes.   Wet Pillows: Yes.   (occasionally) Postural symptoms:  No. (unless gets up quick)  Falls?  No. Bradykinesia symptoms: slow movements; able to get up better now that on levodopa Loss of smell:  yes Loss of taste:  No. Urinary Incontinence: minimal - better with levodopa Difficulty Swallowing:  Yes.   but better now (would choke on saliva) Handwriting, micrographia: Yes.   (R hand dominant) Trouble with ADL's:  No. (shaving is easier now that  on levodopa; wife helps put on belt)  Trouble buttoning clothing: No. Depression:  No. Memory changes:  Yes.   but minor Hallucinations:  No.  visual distortions:rare N/V:  No. Lightheaded:  No.  Syncope: No. Diplopia:  No. Dyskinesia:  No.  Neuroimaging has previously been performed.  It is not available for my review today.  This was done in [place] prior to the diagnosis.  The patient does state that he had a TIA in 2007.  He was working in the yard on a hot day and he had just gone in and he sat down and felt a pain in the right side and tinnitus in the right ear and he had R face droop on the way to the hospital and R arm was "curling" on the way to the hospital.  He states that he was out of the window for TPA but was given IV heparin but was back to normal by 4 am (started at 5:30 pm but was at hospital by 6:30 pm so was unclear why out of TPA window).    10/26/14 update:  The patient returns today for follow-up, accompanied by his wife who supplements the history.  Last visit, changed him from Requip XL, 6 mg daily to ropinirole 2 mg 3 times per day, solely because of cost.  His Azilect was also discontinued because of cost.  His levodopa was increased to carbidopa/levodopa 25/100, 1-1/2 tablets 3 times per day.  I planned to order an MRI of the brain and cervical spine, but this was canceled by the patient because of cost.  Today, the patient states that he feels better overall and his gait is better; he no longer feels "foggy."  He is exercising.  He is doing cardio at [employer] and doing weights and cardio.  No falls.  Occ lightheadedness.  No syncope.  No hallucinations.  Very vivid dreams.  Is talking in sleep.  No hitting wife at night.  Not falling out bed.  "I feel better than I have in years."  02/27/15 update:  The patient is following up today regarding his Parkinson's disease.  He is currently on carbidopa/levodopa 25/100, 1-1/2 tablets 3 times per day in addition to  ropinirole, 2 mg 3 times per day (5-6am/11-12/and then q hs).  He had one fall on 12/30/14 since last visit.  He tripped over an uneven place in the sidewalk.  He states that he had rib pain but has recovered.  No syncope or lightheadedness.  No hallucinations.  Having very vivid dreams.  He will flail the arms and scream out but has not gotten hurt.  Wife states that it is actually a little better than in the past.  He started on melatonin and that helped his sleep.  He is noting stiffness in the neck and has trouble looking up.  He points to discomfort in the trapezius.  Late in the evening, he feels that his legs will "twitch" and he cannot sit still.  If he moves around, it will go away.    Asks me about changing his levitra to viagra.    06/27/15 update:  The patient is following up today regarding his Parkinson's disease.  He is currently on carbidopa/levodopa 25/100, 1-1/2 tablets 3 times per day in addition to ropinirole, 2 mg 3 times per day (5-6am/11-12/and then q hs).  States that he strained his back a few weeks ago (just moved wrong) and therefore and hasn't been as active.  Also has noted some tightness in his neck/trap and it has caused some nausea.  Doesn't think that it is at all related to wearing off of medication.  Still going to [employer] but not as vigorous with exercise.  No falls.  No lightheadedness.  Wife moved bedrooms but she can still sometimes hear him acting out the dream.    08/02/15 update:  The patient is following up today, much earlier than expected. This patient is accompanied in the office by his spouse who supplements the history. I have reviewed prior records made available to me.  He remains on Requip, 2 mg 3 times per day as well as carbidopa/levodopa 25/100, 1-1/2 tablets 3 times per day.  He presented to the emergency room on 07/03/2015 with community-acquired pneumonia and was treated with Levaquin.  He was taking an over-the-counter decongestant and NyQuil for his  symptoms.  He went back to the emergency room on 07/06/2015 with palpitations and it was determined that he was in atrial fibrillation and it was thought that it was due to the over-the-counter decongestant.  He spontaneously converted back to normal sinus rhythm in the ED.  The emergency room physician consulted with the cardiologist over the telephone and started him on amiodarone.  The patient was to follow-up with the cardiologist.  I do not have any  other cardiology records.  The patient states that he did a nuclear stress test and "passed that."  He is going to leave him on the amiodarone lifetime per the patient.   Pt states that since he was put on that he has been sluggish.  He is having some lightheadedness especially in the AM and it has affected his AM workout routine.  He feels that his PD has worsened and feels more tremulous.  He was going to get LSVT, but states that the therapy was very expensive for him.  11/26/15 update:  The patient follows up today, accompanied by his wife who supplements the history.  I have had the opportunity to review prior records made available to me.  He remains on ropinirole, 2 mgrams 3 times a day and last visit I increased his carbidopa/levodopa 25/100-2 tablets 3 times per day.  We had also requested a second opinion on whether he needed lifetime amiodarone, as that was making his Parkinson's worse.  He saw Dr. Stanford Breed who discontinued the amiodarone.  He has since followed up with Dr.Gollan since he is closer to his home in [place].  The patient is now just on metoprolol for rate control.  He is exercising 3 days a week.  No falls.  No hallucinations.  Talks in his sleep.  No neck pain issues.   PREVIOUS MEDICATIONS: Sinemet, Requip and azilect  ALLERGIES:   Allergies  Allergen Reactions  . Codeine Other (See Comments)    Reaction: Hallucinations    CURRENT MEDICATIONS:  Outpatient Encounter Prescriptions as of 11/26/2015  Medication Sig  .  carbidopa-levodopa (SINEMET IR) 25-100 MG tablet Take 2 tablets by mouth 3 (three) times daily.  . cetirizine (ZYRTEC) 10 MG tablet Take 10 mg by mouth daily.  Marland Kitchen lisinopril (PRINIVIL,ZESTRIL) 30 MG tablet Take 1 tablet (30 mg total) by mouth daily.  . Melatonin 2.5 MG CAPS Take 1 capsule by mouth at bedtime as needed (for sleep).   . metoprolol succinate (TOPROL XL) 25 MG 24 hr tablet Take 1 tablet (25 mg total) by mouth daily.  . rivaroxaban (XARELTO) 20 MG TABS tablet Take 1 tablet (20 mg total) by mouth daily.  Marland Kitchen rOPINIRole (REQUIP) 2 MG tablet TAKE 1 TABLET THREE TIMES DAILY  . sildenafil (REVATIO) 20 MG tablet Take 1 tablet (20 mg total) by mouth 3 (three) times daily as needed.  . simvastatin (ZOCOR) 40 MG tablet TAKE 1 TABLET (40 MG TOTAL) BY MOUTH DAILY.  . traMADol (ULTRAM) 50 MG tablet    No facility-administered encounter medications on file as of 11/26/2015.     PAST MEDICAL HISTORY:   Past Medical History:  Diagnosis Date  . Allergy   . Arthritis    Osteoarthritis  . Arthritis of knee   . Atrial fibrillation ([place])   . DVT (deep venous thrombosis) (HCC)    L leg  . Hyperchloremia   . Hypercholesterolemia   . Hypertension   . Nephrolithiasis   . Parkinson's disease ([place])   . Plantar wart   . Pulmonary embolism ([place])   . Seasonal allergies   . TIA (transient ischemic attack)     PAST SURGICAL HISTORY:   Past Surgical History:  Procedure Laterality Date  . APPENDECTOMY    . KNEE ARTHROSCOPY WITH MENISCAL REPAIR Right 2009  . KNEE SURGERY Right   . T&A    . TONSILLECTOMY      SOCIAL HISTORY:   Social History   Social History  . Marital status:  Married    Spouse name: N/A  . Number of children: N/A  . Years of education: N/A   Occupational History  . 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 Smoking status: Never Smoker  . Smokeless tobacco: Never Used  . Alcohol use No  . Drug use: No  . Sexual activity: Not on file   Other Topics  Concern  . Not on file   Social History Narrative   ** Merged History Encounter **        FAMILY HISTORY:   Family Status  Relation Status  . Father Deceased   PD  . Mother Deceased   CHF  . Sister Deceased   CHF  . Sister Deceased   breast cancer  . Sister Alive   heart disease    ROS:  A complete 10 system review of systems was obtained and was unremarkable apart from what is mentioned above.  PHYSICAL EXAMINATION:    VITALS:   Vitals:   11/26/15 1257  BP: 122/84  Pulse: (!) 59  Weight: 264 lb (119.7 kg)  Height: 5\' 10"  (1.778 m)    GEN:  The patient appears stated age and is in NAD. HEENT:  Normocephalic, atraumatic.  The mucous membranes are moist. The superficial temporal arteries are without ropiness or tenderness. CV:  RRR Lungs:  CTAB Neck/HEME:  There are no carotid bruits bilaterally.  The neck is slightly turned into sidebending to the right.  Neurological examination:  Orientation: The patient is alert and oriented x3. Cranial nerves: There is good facial symmetry.  Extraocular muscles are intact. The visual fields are full to confrontational testing. The speech is fluent and clear. Soft palate rises symmetrically and there is no tongue deviation. Hearing is intact to conversational tone. Sensation: Sensation is intact to light touch throughout Motor: Strength is 5/5 in the bilateral upper and lower extremities.   Shoulder shrug is equal and symmetric.  There is no pronator drift.   Movement examination: Tone: There is mild increased tone in the bilateral upper extremities Abnormal movements: None Coordination:  There is minimal decremation with finger taps and alternation of supination/pronation in the forearm on the right. Gait and Station: The patient has no difficulty arising out of a deep-seated chair without the use of the hands. The patient's stride length is decreased with decreased arm swing and stooped posture.       ASSESSMENT/PLAN:  1.  Idiopathic Parkinson's disease, diagnosed in October 2013 in [place] with nonmotor symptoms that preceded this for several years.  Father had PD as well.    -continue ropinirole 2 mg 3 times.  No evidence of compulsive behaviors.   -continue carbidopa/levodopa 25/100-2 tablets 3 times per day.  Risks, benefits, side effects and alternative therapies were discussed.  The opportunity to ask questions was given and they were answered to the best of my ability.  The patient expressed understanding and willingness to follow the outlined treatment protocols.  -Talked to the patient and his wife about community resources.  Had him meet our new Education officer, museum. 2.  Constipation, associated with Parkinson's disease.  -Patient doing well with prunes, occasional miralax and has rancho recipe 3.  Hyperreflexia but neck pain better per patient  -very hyperreflexic but doesn't wish to pursue MRI cervical spine 4.  REM behavior disorder  -not on meds.  Discussed in detail today.  Wife no longer sleeps with him.  Still doesn't want meds.  Will let  me know if changes mind 5.  A-fib  -back in sinus and off amiodarone, which made tremor and PD sx's worse.  Doing much better after d/c 6.  Follow up is anticipated in the next few months, sooner should new neurologic issues arise.  Greater than 50% of the 25 minute visit in counseling.

## 2015-11-26 ENCOUNTER — Ambulatory Visit (INDEPENDENT_AMBULATORY_CARE_PROVIDER_SITE_OTHER): Payer: Medicare PPO | Admitting: Neurology

## 2015-11-26 ENCOUNTER — Encounter: Payer: Self-pay | Admitting: Neurology

## 2015-11-26 VITALS — BP 122/84 | HR 59 | Ht 70.0 in | Wt 264.0 lb

## 2015-11-26 DIAGNOSIS — G20 Parkinson's disease: Secondary | ICD-10-CM | POA: Diagnosis not present

## 2015-11-26 DIAGNOSIS — G4752 REM sleep behavior disorder: Secondary | ICD-10-CM | POA: Diagnosis not present

## 2015-11-26 NOTE — Patient Instructions (Signed)
Try AquaPhor or CeraVie for the dry skin

## 2015-11-26 NOTE — Progress Notes (Signed)
Clinical Social Work Note  CSW received referral from Dr. Carles Collet to meet with pt and pt wife during today's visit to introduce self and explain role. CSW met with pt and pt wife in exam room. CSW introduced self and discussed social work services including connection to resources, Scientist, clinical (histocompatibility and immunogenetics), and discussed local support groups. Pt discussed that he exercises three times a week at [employer] in [place] and states that there is a Clinical research associate at [employer] that has at least one individual with Parkinson's Disease as a client, but pt does not work with him as he has developed a good work out routine that he is able to accomplish without a Clinical research associate. Pt discussed that motivation can be difficult, but he strives to stay motivated in order to continue his exercise routine which includes cardio and weights three times a week.  Pt discussed that he and his wife moved from [place] and pt expressed that in [place] he was connected with more individuals with Parkinson's disease than he is in [place]. CSW inquired if pt had attended support groups in [place] or [place]. Pt wife discussed that pt and pt wife attended the [facility] support group, but seeing individuals in advanced stages of Parkinson's Disease was difficult and "left them more down than uplifted" upon leaving the meeting. Pt reports that he has never attended the [employer], but is agreeable to CSW providing information on the [place]. Pt expressed hope that he will be able to connect with other individuals in a similar stage of PD. Pt and pt wife appreciative of CSW support and assistance. CSW provided CSW contact information to pt and pt wife and provided information on [place] support group. CSW to remain available to provide support as needed.  Alison Murray, MSW, LCSW Clinical Social Worker Movement [facility] [phone]

## 2015-12-03 NOTE — Telephone Encounter (Signed)
Finished

## 2016-01-14 ENCOUNTER — Other Ambulatory Visit: Payer: Self-pay | Admitting: Family Medicine

## 2016-02-29 ENCOUNTER — Encounter: Payer: Medicare PPO | Admitting: Family Medicine

## 2016-03-07 ENCOUNTER — Encounter: Payer: Medicare PPO | Admitting: Family Medicine

## 2016-03-13 ENCOUNTER — Encounter: Payer: Medicare PPO | Admitting: Family Medicine

## 2016-03-26 NOTE — Progress Notes (Signed)
Thomas [name] was seen today in the movement disorders clinic for neurologic consultation at the request of Thomas Liter, DO.  The consultation is for the evaluation of PD.  He is accompanied by his wife who supplements the history.  The patient has seen Thomas [name] at [facility] and I do have those notes.  The patient was dx with PD in Oct 2013 but first sx's probably started in the summer of 2013 with slowness of movement, R greater than L.  He does state that non-motor sx's like constipation occurred several years prior to this.  He was in [place] at the time.  He was initially started on Azilect in October 2013 and about 6 months later requip was added.  He was started initially at requip XL 2 mg and has worked up to requip XL 6 mg (the last increase was 10/2013).  He noticed a benefit with the requip but the XL is very expensive and asks about that.  He initially had some mental foginess with requip but that is better. Also asks about azilect as hasn't seen a benefit and is costing him $300.  Levodopa was then added by Dr. [name] in 03/2014.  It helped with cramping, rigidity.  He takes it at 4 am (but then goes back to sleep until 6am), 12pm, 8pm (bed at 8:30pm). He exercises faithfully.  He has neck turning to the right and has since the dx.  Some pain.     Specific Other Symptoms:  Tremor: Yes.  , only with pushing/pulling something or with stress Voice: weaker than in the past Sleep: sleeps well  Vivid Dreams:  Yes.    Acting out dreams:  Yes.   Wet Pillows: Yes.   (occasionally) Postural symptoms:  No. (unless gets up quick)  Falls?  No. Bradykinesia symptoms: slow movements; able to get up better now that on levodopa Loss of smell:  yes Loss of taste:  No. Urinary Incontinence: minimal - better with levodopa Difficulty Swallowing:  Yes.   but better now (would choke on saliva) Handwriting, micrographia: Yes.   (R hand dominant) Trouble with ADL's:  No. (shaving is easier now that  on levodopa; wife helps put on belt)  Trouble buttoning clothing: No. Depression:  No. Memory changes:  Yes.   but minor Hallucinations:  No.  visual distortions:rare N/V:  No. Lightheaded:  No.  Syncope: No. Diplopia:  No. Dyskinesia:  No.  Neuroimaging has previously been performed.  It is not available for my review today.  This was done in [place] prior to the diagnosis.  The patient does state that he had a TIA in 2007.  He was working in the yard on a hot day and he had just gone in and he sat down and felt a pain in the right side and tinnitus in the right ear and he had R face droop on the way to the hospital and R arm was "curling" on the way to the hospital.  He states that he was out of the window for TPA but was given IV heparin but was back to normal by 4 am (started at 5:30 pm but was at hospital by 6:30 pm so was unclear why out of TPA window).    10/26/14 update:  The patient returns today for follow-up, accompanied by his wife who supplements the history.  Last visit, changed him from Requip XL, 6 mg daily to ropinirole 2 mg 3 times per day, solely because of cost.  His Azilect was also discontinued because of cost.  His levodopa was increased to carbidopa/levodopa 25/100, 1-1/2 tablets 3 times per day.  I planned to order an MRI of the brain and cervical spine, but this was canceled by the patient because of cost.  Today, the patient states that he feels better overall and his gait is better; he no longer feels "foggy."  He is exercising.  He is doing cardio at [employer] and doing weights and cardio.  No falls.  Occ lightheadedness.  No syncope.  No hallucinations.  Very vivid dreams.  Is talking in sleep.  No hitting wife at night.  Not falling out bed.  "I feel better than I have in years."  02/27/15 update:  The patient is following up today regarding his Parkinson's disease.  He is currently on carbidopa/levodopa 25/100, 1-1/2 tablets 3 times per day in addition to  ropinirole, 2 mg 3 times per day (5-6am/11-12/and then q hs).  He had one fall on 12/30/14 since last visit.  He tripped over an uneven place in the sidewalk.  He states that he had rib pain but has recovered.  No syncope or lightheadedness.  No hallucinations.  Having very vivid dreams.  He will flail the arms and scream out but has not gotten hurt.  Wife states that it is actually a little better than in the past.  He started on melatonin and that helped his sleep.  He is noting stiffness in the neck and has trouble looking up.  He points to discomfort in the trapezius.  Late in the evening, he feels that his legs will "twitch" and he cannot sit still.  If he moves around, it will go away.    Asks me about changing his levitra to viagra.    06/27/15 update:  The patient is following up today regarding his Parkinson's disease.  He is currently on carbidopa/levodopa 25/100, 1-1/2 tablets 3 times per day in addition to ropinirole, 2 mg 3 times per day (5-6am/11-12/and then q hs).  States that he strained his back a few weeks ago (just moved wrong) and therefore and hasn't been as active.  Also has noted some tightness in his neck/trap and it has caused some nausea.  Doesn't think that it is at all related to wearing off of medication.  Still going to [employer] but not as vigorous with exercise.  No falls.  No lightheadedness.  Wife moved bedrooms but she can still sometimes hear him acting out the dream.    08/02/15 update:  The patient is following up today, much earlier than expected. This patient is accompanied in the office by his spouse who supplements the history. I have reviewed prior records made available to me.  He remains on Requip, 2 mg 3 times per day as well as carbidopa/levodopa 25/100, 1-1/2 tablets 3 times per day.  He presented to the emergency room on 07/03/2015 with community-acquired pneumonia and was treated with Levaquin.  He was taking an over-the-counter decongestant and NyQuil for his  symptoms.  He went back to the emergency room on 07/06/2015 with palpitations and it was determined that he was in atrial fibrillation and it was thought that it was due to the over-the-counter decongestant.  He spontaneously converted back to normal sinus rhythm in the ED.  The emergency room physician consulted with the cardiologist over the telephone and started him on amiodarone.  The patient was to follow-up with the cardiologist.  I do not have any  other cardiology records.  The patient states that he did a nuclear stress test and "passed that."  He is going to leave him on the amiodarone lifetime per the patient.   Pt states that since he was put on that he has been sluggish.  He is having some lightheadedness especially in the AM and it has affected his AM workout routine.  He feels that his PD has worsened and feels more tremulous.  He was going to get LSVT, but states that the therapy was very expensive for him.  11/26/15 update:  The patient follows up today, accompanied by his wife who supplements the history.  I have had the opportunity to review prior records made available to me.  He remains on ropinirole, 2 mgrams 3 times a day and last visit I increased his carbidopa/levodopa 25/100-2 tablets 3 times per day.  We had also requested a second opinion on whether he needed lifetime amiodarone, as that was making his Parkinson's worse.  He saw Dr. Stanford Breed who discontinued the amiodarone.  He has since followed up with Dr.Gollan since he is closer to his home in [place].  The patient is now just on metoprolol for rate control.  He is exercising 3 days a week.  No falls.  No hallucinations.  Talks in his sleep.  No neck pain issues.   03/27/16 update:  The patient follows up today, the pupils wife who supplements the history.  He is on ropinirole, 2 mg 3 times a day and carbidopa/levodopa 25/100, 2 tablets 3 times a day.  Noting some cognitive fogginess and wife noting some memory change as well,  especially if he is under pressure.  Cognitive change described as coming and going and just not feeling well.  Pt denies falls.  Pt denies lightheadedness, near syncope.  No hallucinations.  Mood has been good but occasionally feels "sorry for myself."  In regards to RBD, states that has been good and not acting out dreams.  Exercising at [employer] but having more trouble doing this than in the past and doesn't enjoy like he used to.  PREVIOUS MEDICATIONS: Sinemet, Requip and azilect  ALLERGIES:   Allergies  Allergen Reactions  . Codeine Other (See Comments)    Reaction: Hallucinations    CURRENT MEDICATIONS:  Outpatient Encounter Prescriptions as of 03/27/2016  Medication Sig  . carbidopa-levodopa (SINEMET IR) 25-100 MG tablet Take 2 tablets by mouth 3 (three) times daily.  . cetirizine (ZYRTEC) 10 MG tablet Take 10 mg by mouth daily.  Marland Kitchen lisinopril (PRINIVIL,ZESTRIL) 30 MG tablet Take 1 tablet (30 mg total) by mouth daily.  . Melatonin 2.5 MG CAPS Take 1 capsule by mouth at bedtime as needed (for sleep).   . metoprolol succinate (TOPROL XL) 25 MG 24 hr tablet Take 1 tablet (25 mg total) by mouth daily.  . rivaroxaban (XARELTO) 20 MG TABS tablet Take 1 tablet (20 mg total) by mouth daily.  Marland Kitchen rOPINIRole (REQUIP) 2 MG tablet TAKE 1 TABLET THREE TIMES DAILY  . sildenafil (REVATIO) 20 MG tablet Take 1 tablet (20 mg total) by mouth 3 (three) times daily as needed.  . simvastatin (ZOCOR) 40 MG tablet TAKE 1 TABLET EVERY DAY  . traMADol (ULTRAM) 50 MG tablet    No facility-administered encounter medications on file as of 03/27/2016.     PAST MEDICAL HISTORY:   Past Medical History:  Diagnosis Date  . Allergy   . Arthritis    Osteoarthritis  . Arthritis of knee   .  Atrial fibrillation ([place])   . DVT (deep venous thrombosis) ([facility])    L leg  . Hyperchloremia   . Hypercholesterolemia   . Hypertension   . Nephrolithiasis   . Parkinson's disease ([place])   . Plantar wart   . Pulmonary  embolism ([place])   . Seasonal allergies   . TIA (transient ischemic attack)     PAST SURGICAL HISTORY:   Past Surgical History:  Procedure Laterality Date  . APPENDECTOMY    . KNEE ARTHROSCOPY WITH MENISCAL REPAIR Right 2009  . KNEE SURGERY Right   . T&A    . TONSILLECTOMY      SOCIAL HISTORY:   Social History   Social History  . Marital status: Married    Spouse name: N/A  . Number of children: N/A  . Years of education: N/A   Occupational History  . 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 Smoking status: Never Smoker  . Smokeless tobacco: Never Used  . Alcohol use No  . Drug use: No  . Sexual activity: Not on file   Other Topics Concern  . Not on file   Social History Narrative   ** Merged History Encounter **        FAMILY HISTORY:   Family Status  Relation Status  . Father Deceased   PD  . Mother Deceased   CHF  . Sister Deceased   CHF  . Sister Deceased   breast cancer  . Sister Alive   heart disease    ROS:  A complete 10 system review of systems was obtained and was unremarkable apart from what is mentioned above.  PHYSICAL EXAMINATION:    VITALS:   Vitals:   03/27/16 1116  BP: 120/80  Pulse: (!) 58  Weight: 262 lb (118.8 kg)  Height: 5\' 10"  (1.778 m)    GEN:  The patient appears stated age and is in NAD. HEENT:  Normocephalic, atraumatic.  The mucous membranes are moist. The superficial temporal arteries are without ropiness or tenderness. CV:  Bradycardic.  Regular rhythm. Lungs:  CTAB Neck/HEME:  There are no carotid bruits bilaterally.  The neck is slightly turned into sidebending to the right.  Neurological examination:  Orientation: The patient is alert and oriented x3. Cranial nerves: There is good facial symmetry.  Extraocular muscles are intact. The visual fields are full to confrontational testing. The speech is fluent and clear. Soft palate rises symmetrically and there is no tongue deviation. Hearing is  intact to conversational tone. Sensation: Sensation is intact to light touch throughout Motor: Strength is 5/5 in the bilateral upper and lower extremities.   Shoulder shrug is equal and symmetric.  There is no pronator drift.   Movement examination: Tone: There is mild increased tone in the RUE Abnormal movements: mild dyskinesia in the shoulders/head/axial region Coordination:  There is minimal decremation with finger taps and alternation of supination/pronation in the forearm on the right. Gait and Station: The patient has no difficulty arising out of a deep-seated chair without the use of the hands. The patient's stride length is decreased with decreased arm swing and stooped posture.      ASSESSMENT/PLAN:  1.  Idiopathic Parkinson's disease, diagnosed in October 2013 in [place] with nonmotor symptoms that preceded this for several years.  Father had PD as well.    -continue ropinirole 2 mg 3 times.  No evidence of compulsive behaviors. Talked about whether to decrease this based on perceived  cognitive change and decided to hold on that; wants to see if cardiology will reduce/discontinue metoprolol first.  Wonders if bradycardia is playing a role.  -continue carbidopa/levodopa 25/100-2 tablets 3 times per day.  Risks, benefits, side effects and alternative therapies were discussed.  The opportunity to ask questions was given and they were answered to the best of my ability.  The patient expressed understanding and willingness to follow the outlined treatment protocols.  -Talked about the fairly new onset dyskinesia.  Is not bothering the patient.  Educated him on this.  Talked about amantadine, but decided to hold on that for now. 2.  Constipation, associated with Parkinson's disease.  -Patient doing well with prunes, occasional miralax and has rancho recipe 3.  Hyperreflexia but neck pain better per patient  -very hyperreflexic but doesn't wish to pursue MRI cervical spine 4.  REM  behavior disorder  -not on meds.  Discussed in detail today.  Wife no longer sleeps with him.  Still doesn't want meds.  Will let me know if changes mind 5.  A-fib  -back in sinus and off amiodarone, which made tremor and PD sx's worse.    -Going to talk with his cardiologist as he is bradycardic on metoprolol and having trouble exercising and just not feeling well.  He is to call me with the outcome of that discussion. 6.  Follow up is anticipated in the next few months, sooner should new neurologic issues arise.  Greater than 50% of the 25 minute visit in counseling.

## 2016-03-27 ENCOUNTER — Ambulatory Visit (INDEPENDENT_AMBULATORY_CARE_PROVIDER_SITE_OTHER): Payer: Medicare PPO | Admitting: Neurology

## 2016-03-27 ENCOUNTER — Encounter: Payer: Self-pay | Admitting: Neurology

## 2016-03-27 VITALS — BP 120/80 | HR 58 | Ht 70.0 in | Wt 262.0 lb

## 2016-03-27 DIAGNOSIS — G249 Dystonia, unspecified: Secondary | ICD-10-CM

## 2016-03-27 DIAGNOSIS — G4752 REM sleep behavior disorder: Secondary | ICD-10-CM | POA: Diagnosis not present

## 2016-03-27 DIAGNOSIS — G20 Parkinson's disease: Secondary | ICD-10-CM | POA: Diagnosis not present

## 2016-03-27 NOTE — Patient Instructions (Signed)
1.  Let me know what happens with your metoprolol and if I need to consider tapering your requip 2.  Merry christmas and happy holidays

## 2016-03-28 ENCOUNTER — Ambulatory Visit (INDEPENDENT_AMBULATORY_CARE_PROVIDER_SITE_OTHER): Payer: Medicare PPO | Admitting: Family Medicine

## 2016-03-28 ENCOUNTER — Encounter: Payer: Self-pay | Admitting: Family Medicine

## 2016-03-28 VITALS — BP 126/81 | HR 54 | Temp 98.3°F | Ht 69.1 in | Wt 260.6 lb

## 2016-03-28 DIAGNOSIS — Z0000 Encounter for general adult medical examination without abnormal findings: Secondary | ICD-10-CM

## 2016-03-28 DIAGNOSIS — I10 Essential (primary) hypertension: Secondary | ICD-10-CM

## 2016-03-28 DIAGNOSIS — Z1211 Encounter for screening for malignant neoplasm of colon: Secondary | ICD-10-CM

## 2016-03-28 DIAGNOSIS — I480 Paroxysmal atrial fibrillation: Secondary | ICD-10-CM

## 2016-03-28 DIAGNOSIS — G20 Parkinson's disease: Secondary | ICD-10-CM | POA: Diagnosis not present

## 2016-03-28 DIAGNOSIS — Z125 Encounter for screening for malignant neoplasm of prostate: Secondary | ICD-10-CM

## 2016-03-28 DIAGNOSIS — E7800 Pure hypercholesterolemia, unspecified: Secondary | ICD-10-CM

## 2016-03-28 LAB — UA/M W/RFLX CULTURE, ROUTINE
Bilirubin, UA: NEGATIVE
Glucose, UA: NEGATIVE
Leukocytes, UA: NEGATIVE
NITRITE UA: NEGATIVE
PH UA: 6 (ref 5.0–7.5)
Protein, UA: NEGATIVE
RBC, UA: NEGATIVE
Specific Gravity, UA: 1.01 (ref 1.005–1.030)
UUROB: 1 mg/dL (ref 0.2–1.0)

## 2016-03-28 LAB — MICROALBUMIN, URINE WAIVED
CREATININE, URINE WAIVED: 200 mg/dL (ref 10–300)
Microalb, Ur Waived: 10 mg/L (ref 0–19)
Microalb/Creat Ratio: <30 mg/g (ref <30)

## 2016-03-28 MED ORDER — LISINOPRIL 30 MG PO TABS: 30.0000 mg | ORAL_TABLET | Freq: Every day | ORAL | 1 refills | Status: DC

## 2016-03-28 MED ORDER — RIVAROXABAN 20 MG PO TABS: 20.0000 mg | ORAL_TABLET | Freq: Every day | ORAL | 1 refills | Status: DC

## 2016-03-28 NOTE — Assessment & Plan Note (Signed)
Under good control. HR bradycardic. Will cut metoprolol in 1/2 and continue to monitor.

## 2016-03-28 NOTE — Assessment & Plan Note (Signed)
Slightly exacerbated. Continue to follow with neurology. Continue exercise. Call with any concerns.

## 2016-03-28 NOTE — Progress Notes (Signed)
BP 126/81 (BP Location: Left Arm, Patient Position: Sitting, Cuff Size: Large)   Pulse (!) 54   Temp 98.3 F (36.8 C)   Ht 5' 9.1" (1.755 m)   Wt 260 lb 9.6 oz (118.2 kg)   SpO2 99%   BMI 38.37 kg/m    Subjective:    Patient ID: Thomas [name], male    DOB: 06/18/1950, 65 y.o.   MRN: [id number]  HPI: Thomas [name] is a 65 y.o. male presenting on 03/28/2016 for comprehensive medical examination. Current medical complaints include:  Not doing as well with his Parkinson's. Hanging in there. Continuing to follow with neurology. Continuing to exercise- making himself go. HR has been low, which makes it harder to exercise.   He currently lives with: wife Interim Problems from his last visit: no  Functional Status Survey: Is the patient deaf or have difficulty hearing?: No Does the patient have difficulty seeing, even when wearing glasses/contacts?: No Does the patient have difficulty concentrating, remembering, or making decisions?: No Does the patient have difficulty walking or climbing stairs?: No Does the patient have difficulty dressing or bathing?: No Does the patient have difficulty doing errands alone such as visiting a doctor's office or shopping?: No  FALL RISK: Fall Risk  03/28/2016 03/27/2016 11/26/2015 08/23/2015  Falls in the past year? No No No No    Depression Screen Depression screen [facility] 2/9 03/28/2016 08/23/2015  Decreased Interest 0 0  Down, Depressed, Hopeless 0 0  PHQ - 2 Score 0 0  Altered sleeping 1 -  Tired, decreased energy 0 -  Change in appetite 0 -  Feeling bad or failure about yourself  1 -  Trouble concentrating 0 -  Moving slowly or fidgety/restless 1 -  Suicidal thoughts 0 -  PHQ-9 Score 3 -    Advanced Directives See appropriate area of the chart   Past Medical History:  Past Medical History:  Diagnosis Date  . Allergy   . Arthritis    Osteoarthritis  . Arthritis of knee   . Atrial fibrillation ([place])   . DVT (deep venous thrombosis)  (HCC)    L leg  . Hyperchloremia   . Hypercholesterolemia   . Hypertension   . Nephrolithiasis   . Parkinson's disease ([place])   . Plantar wart   . Pulmonary embolism ([place])   . Seasonal allergies   . TIA (transient ischemic attack)     Surgical History:  Past Surgical History:  Procedure Laterality Date  . APPENDECTOMY    . KNEE ARTHROSCOPY WITH MENISCAL REPAIR Right 2009  . KNEE SURGERY Right   . T&A    . TONSILLECTOMY      Medications:  Current Outpatient Prescriptions on File Prior to Visit  Medication Sig  . carbidopa-levodopa (SINEMET IR) 25-100 MG tablet Take 2 tablets by mouth 3 (three) times daily.  . cetirizine (ZYRTEC) 10 MG tablet Take 10 mg by mouth daily.  . Melatonin 2.5 MG CAPS Take 1 capsule by mouth at bedtime as needed (for sleep).   . metoprolol succinate (TOPROL XL) 25 MG 24 hr tablet Take 1 tablet (25 mg total) by mouth daily.  Marland Kitchen rOPINIRole (REQUIP) 2 MG tablet TAKE 1 TABLET THREE TIMES DAILY  . sildenafil (REVATIO) 20 MG tablet Take 1 tablet (20 mg total) by mouth 3 (three) times daily as needed.  . simvastatin (ZOCOR) 40 MG tablet TAKE 1 TABLET EVERY DAY  . traMADol (ULTRAM) 50 MG tablet    No current facility-administered medications on  file prior to visit.     Allergies:  Allergies  Allergen Reactions  . Codeine Other (See Comments)    Reaction: Hallucinations    Social History:  Social History   Social History  . Marital status: Married    Spouse name: N/A  . Number of children: N/A  . Years of education: N/A   Occupational History  . 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 Smoking status: Never Smoker  . Smokeless tobacco: Never Used  . Alcohol use No  . Drug use:     Types: Marijuana  . Sexual activity: Not on file   Other Topics Concern  . Not on file   Social History Narrative   ** Merged History Encounter **       History  Smoking Status  . Never Smoker  Smokeless Tobacco  . Never Used   History    Alcohol Use No    Family History:  Family History  Problem Relation Age of Onset  . Parkinson's disease Father   . Coronary artery disease Mother   . Heart attack Sister   . Deep vein thrombosis Sister     Past medical history, surgical history, medications, allergies, family history and social history reviewed with patient today and changes made to appropriate areas of the chart.  ROS  Review of Systems  Constitutional: Negative.   HENT: Negative.   Eyes: Negative.   Respiratory: Negative.   Cardiovascular: Negative.   Gastrointestinal: Positive for constipation and heartburn. Negative for abdominal pain, blood in stool, diarrhea, melena, nausea and vomiting.  Genitourinary: Negative.   Musculoskeletal: Negative.   Skin: Negative.   Neurological: Positive for focal weakness. Negative for dizziness, tingling, tremors, sensory change, speech change, seizures, loss of consciousness and headaches.  Endo/Heme/Allergies: Negative.   Psychiatric/Behavioral: Positive for depression and memory loss. Negative for hallucinations, substance abuse and suicidal ideas. The patient is not nervous/anxious and does not have insomnia.    All other ROS negative except what is listed above and in the HPI.      Objective:    BP 126/81 (BP Location: Left Arm, Patient Position: Sitting, Cuff Size: Large)   Pulse (!) 54   Temp 98.3 F (36.8 C)   Ht 5' 9.1" (1.755 m)   Wt 260 lb 9.6 oz (118.2 kg)   SpO2 99%   BMI 38.37 kg/m   Wt Readings from Last 3 Encounters:  03/28/16 260 lb 9.6 oz (118.2 kg)  03/27/16 262 lb (118.8 kg)  11/26/15 264 lb (119.7 kg)    Physical Exam  Constitutional: He is oriented to person, place, and time. He appears well-developed and well-nourished. No distress.  HENT:  Head: Normocephalic and atraumatic.  Right Ear: Hearing, tympanic membrane, external ear and ear canal normal.  Left Ear: Hearing, tympanic membrane, external ear and ear canal normal.  Nose: Nose  normal.  Mouth/Throat: Uvula is midline, oropharynx is clear and moist and mucous membranes are normal. No oropharyngeal exudate.  Eyes: Conjunctivae, EOM and lids are normal. Pupils are equal, round, and reactive to light. Right eye exhibits no discharge. Left eye exhibits no discharge. No scleral icterus.  Neck: Normal range of motion. Neck supple. No JVD present. No tracheal deviation present. No thyromegaly present.  Cardiovascular: Normal rate, regular rhythm, normal heart sounds and intact distal pulses.  Exam reveals no gallop and no friction rub.   No murmur heard. Pulmonary/Chest: Effort normal and breath sounds normal. No  stridor. No respiratory distress. He has no wheezes. He has no rales. He exhibits no tenderness.  Abdominal: Soft. Bowel sounds are normal. He exhibits no distension and no mass. There is no tenderness. There is no rebound and no guarding.  Genitourinary:  Genitourinary Comments: Deferred with shared decision making  Musculoskeletal: Normal range of motion.  Lymphadenopathy:    He has no cervical adenopathy.  Neurological: He is alert and oriented to person, place, and time. He has normal reflexes. He displays normal reflexes. No cranial nerve deficit. He exhibits abnormal muscle tone. Coordination normal.  Skin: Skin is warm, dry and intact. No rash noted. He is not diaphoretic. No erythema. No pallor.  Psychiatric: His speech is normal and behavior is normal. Judgment and thought content normal. His affect is blunt. Cognition and memory are normal.  Nursing note and vitals reviewed.   6CIT Screen 03/28/2016  What Year? 0 points  What month? 0 points  What time? 0 points  Count back from 20 0 points  Months in reverse 0 points  Repeat phrase 2 points  Total Score 2    Results for orders placed or performed in visit on 03/28/16  Microalbumin, Urine Waived  Result Value Ref Range   Microalb, Ur Waived 10 0 - 19 mg/L   Creatinine, Urine Waived 200 10 - 300  mg/dL   Microalb/Creat Ratio <30 <30 mg/g  UA/M w/rflx Culture, Routine  Result Value Ref Range   Specific Gravity, UA 1.010 1.005 - 1.030   pH, UA 6.0 5.0 - 7.5   Color, UA Yellow Yellow   Appearance Ur Clear Clear   Leukocytes, UA Negative Negative   Protein, UA Negative Negative/Trace   Glucose, UA Negative Negative   Ketones, UA Trace (A) Negative   RBC, UA Negative Negative   Bilirubin, UA Negative Negative   Urobilinogen, Ur 1.0 0.2 - 1.0 mg/dL   Nitrite, UA Negative Negative      Assessment & Plan:   Problem List Items Addressed This Visit      Cardiovascular and Mediastinum   Hypertension    Under good control. Continue current regimen. Continue to monitor. Call with any concerns.       Relevant Medications   lisinopril (PRINIVIL,ZESTRIL) 30 MG tablet   rivaroxaban (XARELTO) 20 MG TABS tablet   Other Relevant Orders   CBC with Differential/Platelet   Comprehensive metabolic panel   Microalbumin, Urine Waived (Completed)   TSH   UA/M w/rflx Culture, Routine (Completed)   Atrial fibrillation (HCC)    Under good control. HR bradycardic. Will cut metoprolol in 1/2 and continue to monitor.       Relevant Medications   lisinopril (PRINIVIL,ZESTRIL) 30 MG tablet   rivaroxaban (XARELTO) 20 MG TABS tablet   Other Relevant Orders   CBC with Differential/Platelet   Comprehensive metabolic panel   TSH     Nervous and Auditory   Parkinson's disease ([place])    Slightly exacerbated. Continue to follow with neurology. Continue exercise. Call with any concerns.         Other   Hypercholesterolemia    Stable. Continue current regimen. Continue to monitor. Call with any concerns.       Relevant Medications   lisinopril (PRINIVIL,ZESTRIL) 30 MG tablet   rivaroxaban (XARELTO) 20 MG TABS tablet   Other Relevant Orders   CBC with Differential/Platelet   Comprehensive metabolic panel   Lipid Panel Piccolo, Waived    Other Visit Diagnoses    Medicare annual wellness  visit, subsequent    -  Primary   Preventative care discussed as below. Call with any concerns.    Screening for prostate cancer       Ordered today. Await results.    Relevant Orders   PSA   Screening for colon cancer       Cologuard ordered today. Await results.   Relevant Orders   Cologuard      Preventative Services:  Health Risk Assessment and Personalized Prevention Plan: Done today Bone Mass Measurements: N/A CVD Screening: done today Colon Cancer Screening: Cologuard ordered Depression Screening: done today Diabetes Screening: done today Glaucoma Screening: see your eye doctor Hepatitis B vaccine: N/A Hepatitis C screening: up to date HIV Screening: N/A Flu Vaccine: up to date Lung cancer Screening: N/A Obesity Screening: done today Pneumonia Vaccines (2): up to date STI Screening: N/A PSA screening: Done today  Discussed aspirin prophylaxis for myocardial infarction prevention and decision was it was not indicated  LABORATORY TESTING:  Health maintenance labs ordered today as discussed above.   The natural history of prostate cancer and ongoing controversy regarding screening and potential treatment outcomes of prostate cancer has been discussed with the patient. The meaning of a false positive PSA and a false negative PSA has been discussed. He indicates understanding of the limitations of this screening test and wishes to proceed with screening PSA testing.   IMMUNIZATIONS:   - Tdap: Tetanus vaccination status reviewed: last tetanus booster within 10 years. - Influenza: Up to date - Pneumovax: Not applicable - Prevnar: Up to date - Zostavax vaccine: Up to date  SCREENING: - Colonoscopy: Cologuard ordered today  Discussed with patient purpose of the colonoscopy is to detect colon cancer at curable precancerous or early stages   PATIENT COUNSELING:    Sexuality: Discussed sexually transmitted diseases, partner selection, use of condoms, avoidance of  unintended pregnancy  and contraceptive alternatives.   Advised to avoid cigarette smoking.  I discussed with the patient that most people either abstain from alcohol or drink within safe limits (<=14/week and <=4 drinks/occasion for males, <=7/weeks and <= 3 drinks/occasion for females) and that the risk for alcohol disorders and other health effects rises proportionally with the number of drinks per week and how often a drinker exceeds daily limits.  Discussed cessation/primary prevention of drug use and availability of treatment for abuse.   Diet: Encouraged to adjust caloric intake to maintain  or achieve ideal body weight, to reduce intake of dietary saturated fat and total fat, to limit sodium intake by avoiding high sodium foods and not adding table salt, and to maintain adequate dietary potassium and calcium preferably from fresh fruits, vegetables, and low-fat dairy products.    stressed the importance of regular exercise  Injury prevention: Discussed safety belts, safety helmets, smoke detector, smoking near bedding or upholstery.   Dental health: Discussed importance of regular tooth brushing, flossing, and dental visits.   Follow up plan: NEXT PREVENTATIVE PHYSICAL DUE IN 1 YEAR. Return in about 6 months (around 09/26/2016) for follow up.

## 2016-03-28 NOTE — Assessment & Plan Note (Signed)
Stable. Continue current regimen. Continue to monitor. Call with any concerns.  

## 2016-03-28 NOTE — Assessment & Plan Note (Signed)
Under good control. Continue current regimen. Continue to monitor. Call with any concerns. 

## 2016-03-28 NOTE — Progress Notes (Deleted)
BP 126/81 (BP Location: Left Arm, Patient Position: Sitting, Cuff Size: Large)   Pulse (!) 54   Temp 98.3 F (36.8 C)   Ht 5' 9.1" (1.755 m)   Wt 260 lb 9.6 oz (118.2 kg)   SpO2 99%   BMI 38.37 kg/m    Subjective:    Patient ID: Thomas [name], male    DOB: [birth year], 65 y.o.   MRN: [id number]  HPI: Thomas [name] is a 65 y.o. male presenting on 03/28/2016 for comprehensive medical examination. Current medical complaints include:{Blank single:19197::"none","***"}  He currently lives with: wife Interim Problems from his last visit: no  Depression Screen done today and results listed below:  Depression screen [facility] 2/9 03/28/2016 08/23/2015  Decreased Interest 0 0  Down, Depressed, Hopeless 0 0  PHQ - 2 Score 0 0  Altered sleeping 1 -  Tired, decreased energy 0 -  Change in appetite 0 -  Feeling bad or failure about yourself  1 -  Trouble concentrating 0 -  Moving slowly or fidgety/restless 1 -  Suicidal thoughts 0 -  PHQ-9 Score 3 -    Past Medical History:  Past Medical History:  Diagnosis Date  . Allergy   . Arthritis    Osteoarthritis  . Arthritis of knee   . Atrial fibrillation ([place])   . DVT (deep venous thrombosis) (HCC)    L leg  . Hyperchloremia   . Hypercholesterolemia   . Hypertension   . Nephrolithiasis   . Parkinson's disease ([place])   . Plantar wart   . Pulmonary embolism ([place])   . Seasonal allergies   . TIA (transient ischemic attack)     Surgical History:  Past Surgical History:  Procedure Laterality Date  . APPENDECTOMY    . KNEE ARTHROSCOPY WITH MENISCAL REPAIR Right 2009  . KNEE SURGERY Right   . T&A    . TONSILLECTOMY      Medications:  Current Outpatient Prescriptions on File Prior to Visit  Medication Sig  . carbidopa-levodopa (SINEMET IR) 25-100 MG tablet Take 2 tablets by mouth 3 (three) times daily.  . cetirizine (ZYRTEC) 10 MG tablet Take 10 mg by mouth daily.  Marland Kitchen lisinopril (PRINIVIL,ZESTRIL) 30 MG tablet Take 1 tablet (30 mg  total) by mouth daily.  . Melatonin 2.5 MG CAPS Take 1 capsule by mouth at bedtime as needed (for sleep).   . metoprolol succinate (TOPROL XL) 25 MG 24 hr tablet Take 1 tablet (25 mg total) by mouth daily.  . rivaroxaban (XARELTO) 20 MG TABS tablet Take 1 tablet (20 mg total) by mouth daily.  Marland Kitchen rOPINIRole (REQUIP) 2 MG tablet TAKE 1 TABLET THREE TIMES DAILY  . sildenafil (REVATIO) 20 MG tablet Take 1 tablet (20 mg total) by mouth 3 (three) times daily as needed.  . simvastatin (ZOCOR) 40 MG tablet TAKE 1 TABLET EVERY DAY  . traMADol (ULTRAM) 50 MG tablet    No current facility-administered medications on file prior to visit.     Allergies:  Allergies  Allergen Reactions  . Codeine Other (See Comments)    Reaction: Hallucinations    Social History:  Social History   Social History  . Marital status: Married    Spouse name: N/A  . Number of children: N/A  . Years of education: N/A   Occupational History  . 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 Smoking status: Never Smoker  . Smokeless tobacco: Never Used  . Alcohol use No  .  Drug use:     Types: Marijuana  . Sexual activity: Not on file   Other Topics Concern  . Not on file   Social History Narrative   ** Merged History Encounter **       History  Smoking Status  . Never Smoker  Smokeless Tobacco  . Never Used   History  Alcohol Use No    Family History:  Family History  Problem Relation Age of Onset  . Parkinson's disease Father   . Coronary artery disease Mother   . Heart attack Sister   . Deep vein thrombosis Sister     Past medical history, surgical history, medications, allergies, family history and social history reviewed with patient today and changes made to appropriate areas of the chart.   Review of Systems  Constitutional: Negative.   HENT: Negative.   Eyes: Negative.   Respiratory: Negative.   Cardiovascular: Negative.   Gastrointestinal: Positive for constipation and  heartburn. Negative for abdominal pain, blood in stool, diarrhea, melena, nausea and vomiting.  Genitourinary: Negative.   Musculoskeletal: Negative.   Skin: Negative.   Neurological: Positive for focal weakness. Negative for dizziness, tingling, tremors, sensory change, speech change, seizures, loss of consciousness and headaches.  Endo/Heme/Allergies: Negative.   Psychiatric/Behavioral: Positive for depression and memory loss. Negative for hallucinations, substance abuse and suicidal ideas. The patient is not nervous/anxious and does not have insomnia.     All other ROS negative except what is listed above and in the HPI.      Objective:    BP 126/81 (BP Location: Left Arm, Patient Position: Sitting, Cuff Size: Large)   Pulse (!) 54   Temp 98.3 F (36.8 C)   Ht 5' 9.1" (1.755 m)   Wt 260 lb 9.6 oz (118.2 kg)   SpO2 99%   BMI 38.37 kg/m   Wt Readings from Last 3 Encounters:  03/28/16 260 lb 9.6 oz (118.2 kg)  03/27/16 262 lb (118.8 kg)  11/26/15 264 lb (119.7 kg)   No exam data present  Physical Exam  Results for orders placed or performed in visit on 08/23/15  Hepatitis C antibody  Result Value Ref Range   Hep C Virus Ab 0.1 0.0 - 0.9 s/co ratio  CBC with Differential/Platelet  Result Value Ref Range   WBC 5.7 3.4 - 10.8 x10E3/uL   RBC 4.54 4.14 - 5.80 x10E6/uL   Hemoglobin 13.4 12.6 - 17.7 g/dL   Hematocrit 41.3 37.5 - 51.0 %   MCV 91 79 - 97 fL   MCH 29.5 26.6 - 33.0 pg   MCHC 32.4 31.5 - 35.7 g/dL   RDW 14.4 12.3 - 15.4 %   Platelets 206 150 - 379 x10E3/uL   Neutrophils 74 %   Lymphs 16 %   Monocytes 8 %   Eos 1 %   Basos 1 %   Neutrophils Absolute 4.2 1.4 - 7.0 x10E3/uL   Lymphocytes Absolute 0.9 0.7 - 3.1 x10E3/uL   Monocytes Absolute 0.4 0.1 - 0.9 x10E3/uL   EOS (ABSOLUTE) 0.1 0.0 - 0.4 x10E3/uL   Basophils Absolute 0.0 0.0 - 0.2 x10E3/uL   Immature Granulocytes 0 %   Immature Grans (Abs) 0.0 0.0 - 0.1 x10E3/uL  Comprehensive metabolic panel  Result  Value Ref Range   Glucose 101 (H) 65 - 99 mg/dL   BUN 21 8 - 27 mg/dL   Creatinine, Ser 1.21 0.76 - 1.27 mg/dL   GFR calc non Af Amer 62 >59 mL/min/1.73  GFR calc Af Amer 72 >59 mL/min/1.73   BUN/Creatinine Ratio 17 10 - 24   Sodium 140 134 - 144 mmol/L   Potassium 4.2 3.5 - 5.2 mmol/L   Chloride 102 96 - 106 mmol/L   CO2 22 18 - 29 mmol/L   Calcium 9.2 8.6 - 10.2 mg/dL   Total Protein 6.9 6.0 - 8.5 g/dL   Albumin 4.0 3.6 - 4.8 g/dL   Globulin, Total 2.9 1.5 - 4.5 g/dL   Albumin/Globulin Ratio 1.4 1.2 - 2.2   Bilirubin Total 0.5 0.0 - 1.2 mg/dL   Alkaline Phosphatase 75 39 - 117 IU/L   AST 7 0 - 40 IU/L   ALT 6 0 - 44 IU/L  INR/PT  Result Value Ref Range   INR 1.1 0.8 - 1.2   Prothrombin Time 11.4 9.1 - 12.0 sec  Lipid Panel w/o Chol/HDL Ratio  Result Value Ref Range   Cholesterol, Total 121 100 - 199 mg/dL   Triglycerides 47 0 - 149 mg/dL   HDL 45 >39 mg/dL   VLDL Cholesterol Cal 9 5 - 40 mg/dL   LDL Calculated 67 0 - 99 mg/dL      Assessment & Plan:   Problem List Items Addressed This Visit      Cardiovascular and Mediastinum   Hypertension   Relevant Orders   CBC with Differential/Platelet   Comprehensive metabolic panel   Microalbumin, Urine Waived   TSH   UA/M w/rflx Culture, Routine   Atrial fibrillation (HCC)   Relevant Orders   CBC with Differential/Platelet   Comprehensive metabolic panel   TSH     Other   Hypercholesterolemia   Relevant Orders   CBC with Differential/Platelet   Comprehensive metabolic panel   Lipid Panel Piccolo, Waived    Other Visit Diagnoses    Medicare annual wellness visit, subsequent    -  Primary   Screening for prostate cancer       Relevant Orders   PSA       Discussed aspirin prophylaxis for myocardial infarction prevention and decision was it was not indicated  LABORATORY TESTING:  Health maintenance labs ordered today as discussed above.   The natural history of prostate cancer and ongoing controversy  regarding screening and potential treatment outcomes of prostate cancer has been discussed with the patient. The meaning of a false positive PSA and a false negative PSA has been discussed. He indicates understanding of the limitations of this screening test and wishes to proceed with screening PSA testing.   IMMUNIZATIONS:   - Tdap: Tetanus vaccination status reviewed: last tetanus booster within 10 years. - Influenza: Up to date - Pneumovax: Not applicable - Prevnar: Up to date - Zostavax vaccine: Up to date  SCREENING: - Colonoscopy: {Blank single:19197::"Up to date","Ordered today","Not applicable","Refused","Done elsewhere"}  Discussed with patient purpose of the colonoscopy is to detect colon cancer at curable precancerous or early stages   -Hearing Test: {Blank single:19197::"Up to date","Ordered today","Not applicable","Refused","Done elsewhere"}   PATIENT COUNSELING:    Sexuality: Discussed sexually transmitted diseases, partner selection, use of condoms, avoidance of unintended pregnancy  and contraceptive alternatives.   Advised to avoid cigarette smoking.  I discussed with the patient that most people either abstain from alcohol or drink within safe limits (<=14/week and <=4 drinks/occasion for males, <=7/weeks and <= 3 drinks/occasion for females) and that the risk for alcohol disorders and other health effects rises proportionally with the number of drinks per week and how often a drinker exceeds  daily limits.  Discussed cessation/primary prevention of drug use and availability of treatment for abuse.   Diet: Encouraged to adjust caloric intake to maintain  or achieve ideal body weight, to reduce intake of dietary saturated fat and total fat, to limit sodium intake by avoiding high sodium foods and not adding table salt, and to maintain adequate dietary potassium and calcium preferably from fresh fruits, vegetables, and low-fat dairy products.    stressed the importance of  regular exercise  Injury prevention: Discussed safety belts, safety helmets, smoke detector, smoking near bedding or upholstery.   Dental health: Discussed importance of regular tooth brushing, flossing, and dental visits.   Follow up plan: NEXT PREVENTATIVE PHYSICAL DUE IN 1 YEAR. No Follow-up on file.

## 2016-03-28 NOTE — Patient Instructions (Addendum)
Preventative Services:  Health Risk Assessment and Personalized Prevention Plan: Done today Bone Mass Measurements: N/A CVD Screening: done today Colon Cancer Screening: Cologuard ordered Depression Screening: done today Diabetes Screening: done today Glaucoma Screening: see your eye doctor Hepatitis B vaccine: N/A Hepatitis C screening: up to date HIV Screening: N/A Flu Vaccine: up to date Lung cancer Screening: N/A Obesity Screening: done today Pneumonia Vaccines (2): up to date STI Screening: N/A PSA screening: Done today Health Maintenance, Male A healthy lifestyle and preventative care can promote health and wellness.  Maintain regular health, dental, and eye exams.  Eat a healthy diet. Foods like vegetables, fruits, whole grains, low-fat dairy products, and lean protein foods contain the nutrients you need and are low in calories. Decrease your intake of foods high in solid fats, added sugars, and salt. Get information about a proper diet from your health care provider, if necessary.  Regular physical exercise is one of the most important things you can do for your health. Most adults should get at least 150 minutes of moderate-intensity exercise (any activity that increases your heart rate and causes you to sweat) each week. In addition, most adults need muscle-strengthening exercises on 2 or more days a week.   Maintain a healthy weight. The body mass index (BMI) is a screening tool to identify possible weight problems. It provides an estimate of body fat based on height and weight. Your health care provider can find your BMI and can help you achieve or maintain a healthy weight. For males 20 years and older:  A BMI below 18.5 is considered underweight.  A BMI of 18.5 to 24.9 is normal.  A BMI of 25 to 29.9 is considered overweight.  A BMI of 30 and above is considered obese.  Maintain normal blood lipids and cholesterol by exercising and minimizing your intake of  saturated fat. Eat a balanced diet with plenty of fruits and vegetables. Blood tests for lipids and cholesterol should begin at age 20 and be repeated every 5 years. If your lipid or cholesterol levels are high, you are over age 59, or you are at high risk for heart disease, you may need your cholesterol levels checked more frequently.Ongoing high lipid and cholesterol levels should be treated with medicines if diet and exercise are not working.  If you smoke, find out from your health care provider how to quit. If you do not use tobacco, do not start.  Lung cancer screening is recommended for adults aged 52-80 years who are at high risk for developing lung cancer because of a history of smoking. A yearly low-dose CT scan of the lungs is recommended for people who have at least a 30-pack-year history of smoking and are current smokers or have quit within the past 15 years. A pack year of smoking is smoking an average of 1 pack of cigarettes a day for 1 year (for example, a 30-pack-year history of smoking could mean smoking 1 pack a day for 30 years or 2 packs a day for 15 years). Yearly screening should continue until the smoker has stopped smoking for at least 15 years. Yearly screening should be stopped for people who develop a health problem that would prevent them from having lung cancer treatment.  If you choose to drink alcohol, do not have more than 2 drinks per day. One drink is considered to be 12 oz (360 mL) of beer, 5 oz (150 mL) of wine, or 1.5 oz (45 mL) of liquor.  Avoid  the use of street drugs. Do not share needles with anyone. Ask for help if you need support or instructions about stopping the use of drugs.  High blood pressure causes heart disease and increases the risk of stroke. High blood pressure is more likely to develop in:  People who have blood pressure in the end of the normal range (100-139/85-89 mm Hg).  People who are overweight or obese.  People who are African  American.  If you are 10-43 years of age, have your blood pressure checked every 3-5 years. If you are 71 years of age or older, have your blood pressure checked every year. You should have your blood pressure measured twice-once when you are at a hospital or clinic, and once when you are not at a hospital or clinic. Record the average of the two measurements. To check your blood pressure when you are not at a hospital or clinic, you can use:  An automated blood pressure machine at a pharmacy.  A home blood pressure monitor.  If you are 74-66 years old, ask your health care provider if you should take aspirin to prevent heart disease.  Diabetes screening involves taking a blood sample to check your fasting blood sugar level. This should be done once every 3 years after age 50 if you are at a normal weight and without risk factors for diabetes. Testing should be considered at a younger age or be carried out more frequently if you are overweight and have at least 1 risk factor for diabetes.  Colorectal cancer can be detected and often prevented. Most routine colorectal cancer screening begins at the age of 4 and continues through age 68. However, your health care provider may recommend screening at an earlier age if you have risk factors for colon cancer. On a yearly basis, your health care provider may provide home test kits to check for hidden blood in the stool. A small camera at the end of a tube may be used to directly examine the colon (sigmoidoscopy or colonoscopy) to detect the earliest forms of colorectal cancer. Talk to your health care provider about this at age 73 when routine screening begins. A direct exam of the colon should be repeated every 5-10 years through age 14, unless early forms of precancerous polyps or small growths are found.  People who are at an increased risk for hepatitis B should be screened for this virus. You are considered at high risk for hepatitis B if:  You were  born in a country where hepatitis B occurs often. Talk with your health care provider about which countries are considered high risk.  Your parents were born in a high-risk country and you have not received a shot to protect against hepatitis B (hepatitis B vaccine).  You have HIV or AIDS.  You use needles to inject street drugs.  You live with, or have sex with, someone who has hepatitis B.  You are a man who has sex with other men (MSM).  You get hemodialysis treatment.  You take certain medicines for conditions like cancer, organ transplantation, and autoimmune conditions.  Hepatitis C blood testing is recommended for all people born from [birth year] through 1966 and any individual with known risk factors for hepatitis C.  Healthy men should no longer receive prostate-specific antigen (PSA) blood tests as part of routine cancer screening. Talk to your health care provider about prostate cancer screening.  Testicular cancer screening is not recommended for adolescents or adult males who have  no symptoms. Screening includes self-exam, a health care provider exam, and other screening tests. Consult with your health care provider about any symptoms you have or any concerns you have about testicular cancer.  Practice safe sex. Use condoms and avoid high-risk sexual practices to reduce the spread of sexually transmitted infections (STIs).  You should be screened for STIs, including gonorrhea and chlamydia if:  You are sexually active and are younger than 24 years.  You are older than 24 years, and your health care provider tells you that you are at risk for this type of infection.  Your sexual activity has changed since you were last screened, and you are at an increased risk for chlamydia or gonorrhea. Ask your health care provider if you are at risk.  If you are at risk of being infected with HIV, it is recommended that you take a prescription medicine daily to prevent HIV infection. This is  called pre-exposure prophylaxis (PrEP). You are considered at risk if:  You are a man who has sex with other men (MSM).  You are a heterosexual man who is sexually active with multiple partners.  You take drugs by injection.  You are sexually active with a partner who has HIV.  Talk with your health care provider about whether you are at high risk of being infected with HIV. If you choose to begin PrEP, you should first be tested for HIV. You should then be tested every 3 months for as long as you are taking PrEP.  Use sunscreen. Apply sunscreen liberally and repeatedly throughout the day. You should seek shade when your shadow is shorter than you. Protect yourself by wearing long sleeves, pants, a wide-brimmed hat, and sunglasses year round whenever you are outdoors.  Tell your health care provider of new moles or changes in moles, especially if there is a change in shape or color. Also, tell your health care provider if a mole is larger than the size of a pencil eraser.  A one-time screening for abdominal aortic aneurysm (AAA) and surgical repair of large AAAs by ultrasound is recommended for men aged 49-75 years who are current or former smokers.  Stay current with your vaccines (immunizations). This information is not intended to replace advice given to you by your health care provider. Make sure you discuss any questions you have with your health care provider. Document Released: 09/27/2007 Document Revised: 04/21/2014 Document Reviewed: 01/02/2015 Elsevier Interactive Patient Education  2017 [employer].

## 2016-03-29 LAB — CBC WITH DIFFERENTIAL/PLATELET
BASOS ABS: 0 10*3/uL (ref 0.0–0.2)
Basos: 1 %
EOS (ABSOLUTE): 0.1 10*3/uL (ref 0.0–0.4)
Eos: 2 %
Hematocrit: 43.9 % (ref 37.5–51.0)
Hemoglobin: 14.6 g/dL (ref 13.0–17.7)
IMMATURE GRANS (ABS): 0 10*3/uL (ref 0.0–0.1)
IMMATURE GRANULOCYTES: 0 %
LYMPHS: 23 %
Lymphocytes Absolute: 1.2 10*3/uL (ref 0.7–3.1)
MCH: 30 pg (ref 26.6–33.0)
MCHC: 33.3 g/dL (ref 31.5–35.7)
MCV: 90 fL (ref 79–97)
Monocytes Absolute: 0.4 10*3/uL (ref 0.1–0.9)
Monocytes: 8 %
NEUTROS PCT: 66 %
Neutrophils Absolute: 3.4 10*3/uL (ref 1.4–7.0)
PLATELETS: 170 10*3/uL (ref 150–379)
RBC: 4.87 x10E6/uL (ref 4.14–5.80)
RDW: 13.2 % (ref 12.3–15.4)
WBC: 5.1 10*3/uL (ref 3.4–10.8)

## 2016-03-29 LAB — COMPREHENSIVE METABOLIC PANEL
ALT: 7 IU/L (ref 0–44)
AST: 10 IU/L (ref 0–40)
Albumin/Globulin Ratio: 1.4 (ref 1.2–2.2)
Albumin: 4 g/dL (ref 3.6–4.8)
Alkaline Phosphatase: 80 IU/L (ref 39–117)
BUN/Creatinine Ratio: 12 (ref 10–24)
BUN: 13 mg/dL (ref 8–27)
Bilirubin Total: 0.5 mg/dL (ref 0.0–1.2)
CALCIUM: 9.1 mg/dL (ref 8.6–10.2)
CHLORIDE: 105 mmol/L (ref 96–106)
CO2: 24 mmol/L (ref 18–29)
Creatinine, Ser: 1.1 mg/dL (ref 0.76–1.27)
GFR, EST AFRICAN AMERICAN: 80 mL/min/{1.73_m2} (ref >59)
GFR, EST NON AFRICAN AMERICAN: 70 mL/min/{1.73_m2} (ref >59)
GLUCOSE: 100 mg/dL — AB (ref 65–99)
Globulin, Total: 2.8 g/dL (ref 1.5–4.5)
Potassium: 4.6 mmol/L (ref 3.5–5.2)
Sodium: 143 mmol/L (ref 134–144)
TOTAL PROTEIN: 6.8 g/dL (ref 6.0–8.5)

## 2016-03-29 LAB — PSA: Prostate Specific Ag, Serum: 1.8 ng/mL (ref 0.0–4.0)

## 2016-03-29 LAB — TSH: TSH: 1.53 u[IU]/mL (ref 0.450–4.500)

## 2016-03-31 ENCOUNTER — Encounter: Payer: Self-pay | Admitting: Family Medicine

## 2016-03-31 ENCOUNTER — Other Ambulatory Visit: Payer: Self-pay | Admitting: Neurology

## 2016-04-02 ENCOUNTER — Telehealth: Payer: Self-pay | Admitting: Family Medicine

## 2016-04-02 ENCOUNTER — Encounter: Payer: Self-pay | Admitting: Family Medicine

## 2016-04-02 NOTE — Telephone Encounter (Signed)
Routing to provider  

## 2016-04-02 NOTE — Telephone Encounter (Signed)
Letter written and in his cart to be printed. I am not able to sign it as I am not in the office, but someone else should be able to sign it. Thanks!

## 2016-04-02 NOTE — Telephone Encounter (Signed)
Letter printed. Will get Dr. Jeananne Rama to sign and place up front for patient pick up. Patient notified.

## 2016-04-02 NOTE — Telephone Encounter (Signed)
Patient received a notice regarding serving on [employer] duty, he needs a note stating that he has Parkinson's and is not able to serve.  They would like to pick this up by Friday if possible.  Thank You Santiago Glad

## 2016-04-08 ENCOUNTER — Telehealth: Payer: Self-pay | Admitting: Neurology

## 2016-04-08 NOTE — Telephone Encounter (Signed)
Thomas [name] 08/03/1950.His wife called and said he saw his PCP and the medication has been reduced to half and did not need to see his Cardiologist, that it could just be done. The medication is Metoprolol 12 1/2 instead of the 25. He is feeling better now that he has done that. Thank you

## 2016-04-08 NOTE — Telephone Encounter (Signed)
Dr. Tat - FYI. 

## 2016-04-10 NOTE — Telephone Encounter (Signed)
Sounds great. Thanks for the update.

## 2016-04-15 ENCOUNTER — Other Ambulatory Visit: Payer: Self-pay

## 2016-04-15 ENCOUNTER — Telehealth: Payer: Self-pay

## 2016-04-15 LAB — COLOGUARD: Cologuard: NEGATIVE

## 2016-04-15 NOTE — Telephone Encounter (Signed)
Patient's cologuard was negative, patients wife notified.

## 2016-04-21 ENCOUNTER — Telehealth: Payer: Self-pay

## 2016-04-21 NOTE — Telephone Encounter (Signed)
Patient's wife called, she would like to know if he can take Coricidin HBP  He does not have a fever, but he does have a cold.

## 2016-04-21 NOTE — Telephone Encounter (Signed)
Patients wife notified

## 2016-04-21 NOTE — Telephone Encounter (Signed)
Yes he can

## 2016-05-02 ENCOUNTER — Ambulatory Visit: Payer: Medicare PPO | Admitting: Hematology and Oncology

## 2016-05-05 ENCOUNTER — Encounter: Payer: Self-pay | Admitting: Hematology and Oncology

## 2016-05-05 DIAGNOSIS — I82502 Chronic embolism and thrombosis of unspecified deep veins of left lower extremity: Secondary | ICD-10-CM | POA: Diagnosis not present

## 2016-05-05 DIAGNOSIS — I2782 Chronic pulmonary embolism: Secondary | ICD-10-CM | POA: Diagnosis not present

## 2016-05-12 DIAGNOSIS — H524 Presbyopia: Secondary | ICD-10-CM | POA: Diagnosis not present

## 2016-05-13 ENCOUNTER — Inpatient Hospital Stay: Payer: Medicare HMO | Attending: Hematology and Oncology | Admitting: Hematology and Oncology

## 2016-05-13 ENCOUNTER — Encounter: Payer: Self-pay | Admitting: Hematology and Oncology

## 2016-05-13 VITALS — BP 116/78 | HR 60 | Temp 95.4°F | Resp 18 | Wt 263.7 lb

## 2016-05-13 DIAGNOSIS — Z7901 Long term (current) use of anticoagulants: Secondary | ICD-10-CM

## 2016-05-13 DIAGNOSIS — I10 Essential (primary) hypertension: Secondary | ICD-10-CM | POA: Diagnosis not present

## 2016-05-13 DIAGNOSIS — E878 Other disorders of electrolyte and fluid balance, not elsewhere classified: Secondary | ICD-10-CM | POA: Diagnosis not present

## 2016-05-13 DIAGNOSIS — I2782 Chronic pulmonary embolism: Secondary | ICD-10-CM

## 2016-05-13 DIAGNOSIS — G20 Parkinson's disease: Secondary | ICD-10-CM | POA: Insufficient documentation

## 2016-05-13 DIAGNOSIS — Z86718 Personal history of other venous thrombosis and embolism: Secondary | ICD-10-CM | POA: Diagnosis not present

## 2016-05-13 DIAGNOSIS — I4891 Unspecified atrial fibrillation: Secondary | ICD-10-CM | POA: Insufficient documentation

## 2016-05-13 DIAGNOSIS — Z86711 Personal history of pulmonary embolism: Secondary | ICD-10-CM | POA: Diagnosis not present

## 2016-05-13 DIAGNOSIS — M1990 Unspecified osteoarthritis, unspecified site: Secondary | ICD-10-CM | POA: Diagnosis not present

## 2016-05-13 DIAGNOSIS — Z79899 Other long term (current) drug therapy: Secondary | ICD-10-CM | POA: Diagnosis not present

## 2016-05-13 DIAGNOSIS — E7800 Pure hypercholesterolemia, unspecified: Secondary | ICD-10-CM | POA: Diagnosis not present

## 2016-05-13 DIAGNOSIS — I82502 Chronic embolism and thrombosis of unspecified deep veins of left lower extremity: Secondary | ICD-10-CM

## 2016-05-13 NOTE — Progress Notes (Signed)
[facility] day:  05/13/2016   Chief Complaint: Thomas [name] is a 66 y.o. male with a distant history of cerebrovascular accident and a recent history of deep venous thrombosis and pulmonary embolism who is seen for 6 month assessment on Xarelto.  HPI: The patient was last seen in the medical oncology clinic on 11/01/2015.  At that time, he was doing well.  He was exercising at [employer].  Exam was unremarkable.  He continued Xarelto.  During the interim, he has continued to go to [employer].  He comments that he doesn't work out as hard.  He walks 20-30 minutes.  He watches TV, mostly.   Past Medical History:  Diagnosis Date  . Allergy   . Arthritis    Osteoarthritis  . Arthritis of knee   . Atrial fibrillation ([place])   . DVT (deep venous thrombosis) ([facility])    L leg  . Hyperchloremia   . Hypercholesterolemia   . Hypertension   . Nephrolithiasis   . Parkinson's disease ([place])   . Plantar wart   . Pulmonary embolism ([facility])   . Seasonal allergies   . TIA (transient ischemic attack)     Past Surgical History:  Procedure Laterality Date  . APPENDECTOMY    . KNEE ARTHROSCOPY WITH MENISCAL REPAIR Right 2009  . KNEE SURGERY Right   . T&A    . TONSILLECTOMY      Family History  Problem Relation Age of Onset  . Parkinson's disease Father   . Coronary artery disease Mother   . Heart attack Sister   . Deep vein thrombosis Sister     Social History:  reports that he has never smoked. He has never used smokeless tobacco. He reports that he uses drugs, including Marijuana. He reports that he does not drink alcohol.  He is back at the gym 3 times a week.  The patient is accompanied by his wife, Thomas [name], today.  Allergies:  Allergies  Allergen Reactions  . Codeine Other (See Comments)    Reaction: Hallucinations   Current Medications: Current Outpatient Prescriptions  Medication Sig Dispense Refill  . carbidopa-levodopa (SINEMET IR)  25-100 MG tablet TAKE 2 TABLETS THREE TIMES DAILY 540 tablet 1  . cetirizine (ZYRTEC) 10 MG tablet Take 10 mg by mouth daily.    Marland Kitchen lisinopril (PRINIVIL,ZESTRIL) 30 MG tablet Take 1 tablet (30 mg total) by mouth daily. 90 tablet 1  . Melatonin 2.5 MG CAPS Take 1 capsule by mouth at bedtime as needed (for sleep).     . metoprolol succinate (TOPROL XL) 25 MG 24 hr tablet Take 1 tablet (25 mg total) by mouth daily. 30 tablet 0  . Propylene Glycol (SYSTANE BALANCE OP) Apply to eye.    . rivaroxaban (XARELTO) 20 MG TABS tablet Take 1 tablet (20 mg total) by mouth daily. 90 tablet 1  . rOPINIRole (REQUIP) 2 MG tablet TAKE 1 TABLET THREE TIMES DAILY 270 tablet 1  . sildenafil (REVATIO) 20 MG tablet Take 1 tablet (20 mg total) by mouth 3 (three) times daily as needed. 90 tablet 6  . simvastatin (ZOCOR) 40 MG tablet TAKE 1 TABLET EVERY DAY 90 tablet 1  . traMADol (ULTRAM) 50 MG tablet      No current facility-administered medications for this visit.    Review of Systems:  GENERAL:  Feels "ok".  More active.  No fevers, sweats or weight loss. PERFORMANCE STATUS (ECOG):  1-2 HEENT:  No visual  changes, runny nose, sore throat, mouth sores or tenderness. Lungs: No shortness of breath or cough.  No hemoptysis. Cardiac:  No chest pain, palpitations, orthopnea, or PND. GI:  No nausea, vomiting, diarrhea, constipation, melena or hematochezia. GU:  No urgency, frequency, dysuria, or hematuria. Musculoskeletal:  Knee problem (flares up occasionally, less frequent).  [name] back issues (worse if stands up long period).  No muscle tenderness. Extremities:  No pain or swelling. Skin:  No rashes or skin changes. Neuro:  Parkinson's.  No headache, numbness or weakness, balance or coordination issues. Endocrine:  No diabetes, thyroid issues, hot flashes or night sweats. Psych:  Restless sleep.  No mood changes, depression or anxiety. Pain:  No focal pain. Review of systems:  All other systems reviewed and found to  be negative.  Physical Exam: Blood pressure 116/78, pulse 60, temperature (!) 95.4 F (35.2 C), resp. rate 18, weight 263 lb 10.7 oz (119.6 kg). GENERAL:  Well developed, well nourished, gentleman sitting comfortably in the exam room in no acute distress. MENTAL STATUS:  Alert and oriented to person, place and time. HEAD:  Pearline Cables short hair.  Normocephalic, atraumatic, face symmetric, no Cushingoid features. EYES:  Glasses.  Blue eyes.  Pupils equal round and reactive to light and accomodation.  No conjunctivitis or scleral icterus. ENT:  Oropharynx clear without lesion.  Tongue normal. Mucous membranes moist.  RESPIRATORY:  Clear to auscultation without rales, wheezes or rhonchi. CARDIOVASCULAR:  Regular rate and rhythm without murmur, rub or gallop. ABDOMEN:  Soft, non-tender, with active bowel sounds, and no hepatosplenomegaly.  No masses. SKIN:  No rashes, ulcers or lesions. EXTREMITIES:  Slight left lower extremity edema.  No skin discoloration or tenderness.  No palpable cords. LYMPH NODES: No palpable cervical, supraclavicular, axillary or inguinal adenopathy  NEUROLOGICAL: Unremarkable. PSYCH:  Appropriate.   [facility] Labs:   CBC on 12/26/2014 revealed a hematocrit of 43.1, hemoglobin 14.6, MCV 89, platelets 195,000, white count 6200 with an ANC of 4100.  Comprehensive metabolic panel was normal with a creatinine of 1.15.  PSA was 7.5 (high) with a repeat value of 2.3 on 09/26/2014. CBC on 03/01/2015 revealed a hematocrit of 40.8, hemoglobin 13.8, MCV 88, platelets 177,000, white count 6800 with an ANC of 4700. Comprehensive  metabolic panel was normal with a creatinine of 1.07. CBC on 10/30/2015 revealed a hematocrit of 43.1, hemoglobin 14.4, MCV 89, platelets 187,000, white count 5400 with an ANC of 3500. D dimers were 0.50 (0-0.49). CBC on 05/05/2016 revealed a hematocrit of 42.6, hemoglobin 14.0, MCV 90, platelets 194,000, white count 8600 with an ANC of 6400. Comprehensive  metabolic  panel was normal with a creatinine of 1.06.  Liver function tests were normal.  D-dimer was 0.54 (0-0.49).   Assessment:  Thomas [name] is a 66 y.o. male with a history of a cerebral vascular accident in 2007 and a history of deep venous thrombosis and pulmonary embolism on 12/07/2013.  There were no precipitating events to his PE and DVT.  He was placed on Xarelto.  Work-up on 05/19/2014 revealed negative Factor V Leiden, beta-2 glycoprotein, and anticardiolipin antibodies. Protein S activity and antigen, and antithrombin III activity were normal.  Additional testing on 06/14/2014 revealed a normal CBC, CMP, protein C antigen and activity, prothrombin gene mutation, and lupus anticoagulant panel.  D-dimers were elevated indicating active clot breakdown.  Lower extremity duplex on 06/23/2014 revealed persistent partially occlusive thrombus in the left posterior tibial, popliteal, and mid femoral veins.   Left lower extremity duplex  on 01/02/2015 revealed persistent but improving nonocclusive partial thrombus in the femoral and popliteal veins.  Calf veins were patient.  Chest CT on 06/23/2014 revealed no pulmonary nodules or adenopathy.  PSA was normal on 09/26/2014.    He has been on Xarelto since 11/2013.  He denies any bruising or bleeding.   Symptomatically, he doing well.  He is exercising at [employer].  Exam is unremarkable.  Plan: 1. Review labs from 05/05/2016 2. [facility] slip. 3. Continue Xarelto.  4. RTC in 6 months for MD assessment and review of [facility] labs.   Lequita Asal, MD  05/13/2016

## 2016-05-13 NOTE — Progress Notes (Signed)
Patient states he is doing well.  Has had no further problems with left leg.

## 2016-06-05 ENCOUNTER — Other Ambulatory Visit: Payer: Self-pay | Admitting: Cardiology

## 2016-07-03 NOTE — Progress Notes (Signed)
Thomas [name] was seen today in the movement disorders clinic for neurologic consultation at the request of Park Liter, DO.  The consultation is for the evaluation of PD.  He is accompanied by his wife who supplements the history.  The patient has seen [name] at [facility] and I do have those notes.  The patient was dx with PD in Oct 2013 but first sx's probably started in the summer of 2013 with slowness of movement, R greater than L.  He does state that non-motor sx's like constipation occurred several years prior to this.  He was in [place] at the time.  He was initially started on Azilect in October 2013 and about 6 months later requip was added.  He was started initially at requip XL 2 mg and has worked up to requip XL 6 mg (the last increase was 10/2013).  He noticed a benefit with the requip but the XL is very expensive and asks about that.  He initially had some mental foginess with requip but that is better. Also asks about azilect as hasn't seen a benefit and is costing him $300.  Levodopa was then added by Dr. [name] in 03/2014.  It helped with cramping, rigidity.  He takes it at 4 am (but then goes back to sleep until 6am), 12pm, 8pm (bed at 8:30pm). He exercises faithfully.  He has neck turning to the right and has since the dx.  Some pain.     Specific Other Symptoms:  Tremor: Yes.  , only with pushing/pulling something or with stress Voice: weaker than in the past Sleep: sleeps well  Vivid Dreams:  Yes.    Acting out dreams:  Yes.   Wet Pillows: Yes.   (occasionally) Postural symptoms:  No. (unless gets up quick)  Falls?  No. Bradykinesia symptoms: slow movements; able to get up better now that on levodopa Loss of smell:  yes Loss of taste:  No. Urinary Incontinence: minimal - better with levodopa Difficulty Swallowing:  Yes.   but better now (would choke on saliva) Handwriting, micrographia: Yes.   (R hand dominant) Trouble with ADL's:  No. (shaving is easier now that  on levodopa; wife helps put on belt)  Trouble buttoning clothing: No. Depression:  No. Memory changes:  Yes.   but minor Hallucinations:  No.  visual distortions:rare N/V:  No. Lightheaded:  No.  Syncope: No. Diplopia:  No. Dyskinesia:  No.  Neuroimaging has previously been performed.  It is not available for my review today.  This was done in [place] prior to the diagnosis.  The patient does state that he had a TIA in 2007.  He was working in the yard on a hot day and he had just gone in and he sat down and felt a pain in the right side and tinnitus in the right ear and he had R face droop on the way to the hospital and R arm was "curling" on the way to the hospital.  He states that he was out of the window for TPA but was given IV heparin but was back to normal by 4 am (started at 5:30 pm but was at hospital by 6:30 pm so was unclear why out of TPA window).    10/26/14 update:  The patient returns today for follow-up, accompanied by his wife who supplements the history.  Last visit, changed him from Requip XL, 6 mg daily to ropinirole 2 mg 3 times per day, solely because of cost.  His Azilect was also discontinued because of cost.  His levodopa was increased to carbidopa/levodopa 25/100, 1-1/2 tablets 3 times per day.  I planned to order an MRI of the brain and cervical spine, but this was canceled by the patient because of cost.  Today, the patient states that he feels better overall and his gait is better; he no longer feels "foggy."  He is exercising.  He is doing cardio at [employer] and doing weights and cardio.  No falls.  Occ lightheadedness.  No syncope.  No hallucinations.  Very vivid dreams.  Is talking in sleep.  No hitting wife at night.  Not falling out bed.  "I feel better than I have in years."  02/27/15 update:  The patient is following up today regarding his Parkinson's disease.  He is currently on carbidopa/levodopa 25/100, 1-1/2 tablets 3 times per day in addition to  ropinirole, 2 mg 3 times per day (5-6am/11-12/and then q hs).  He had one fall on 12/30/14 since last visit.  He tripped over an uneven place in the sidewalk.  He states that he had rib pain but has recovered.  No syncope or lightheadedness.  No hallucinations.  Having very vivid dreams.  He will flail the arms and scream out but has not gotten hurt.  Wife states that it is actually a little better than in the past.  He started on melatonin and that helped his sleep.  He is noting stiffness in the neck and has trouble looking up.  He points to discomfort in the trapezius.  Late in the evening, he feels that his legs will "twitch" and he cannot sit still.  If he moves around, it will go away.    Asks me about changing his levitra to viagra.    06/27/15 update:  The patient is following up today regarding his Parkinson's disease.  He is currently on carbidopa/levodopa 25/100, 1-1/2 tablets 3 times per day in addition to ropinirole, 2 mg 3 times per day (5-6am/11-12/and then q hs).  States that he strained his back a few weeks ago (just moved wrong) and therefore and hasn't been as active.  Also has noted some tightness in his neck/trap and it has caused some nausea.  Doesn't think that it is at all related to wearing off of medication.  Still going to [employer] but not as vigorous with exercise.  No falls.  No lightheadedness.  Wife moved bedrooms but she can still sometimes hear him acting out the dream.    08/02/15 update:  The patient is following up today, much earlier than expected. This patient is accompanied in the office by his spouse who supplements the history. I have reviewed prior records made available to me.  He remains on Requip, 2 mg 3 times per day as well as carbidopa/levodopa 25/100, 1-1/2 tablets 3 times per day.  He presented to the emergency room on 07/03/2015 with community-acquired pneumonia and was treated with Levaquin.  He was taking an over-the-counter decongestant and NyQuil for his  symptoms.  He went back to the emergency room on 07/06/2015 with palpitations and it was determined that he was in atrial fibrillation and it was thought that it was due to the over-the-counter decongestant.  He spontaneously converted back to normal sinus rhythm in the ED.  The emergency room physician consulted with the cardiologist over the telephone and started him on amiodarone.  The patient was to follow-up with the cardiologist.  I do not have any  other cardiology records.  The patient states that he did a nuclear stress test and "passed that."  He is going to leave him on the amiodarone lifetime per the patient.   Pt states that since he was put on that he has been sluggish.  He is having some lightheadedness especially in the AM and it has affected his AM workout routine.  He feels that his PD has worsened and feels more tremulous.  He was going to get LSVT, but states that the therapy was very expensive for him.  11/26/15 update:  The patient follows up today, accompanied by his wife who supplements the history.  I have had the opportunity to review prior records made available to me.  He remains on ropinirole, 2 mgrams 3 times a day and last visit I increased his carbidopa/levodopa 25/100-2 tablets 3 times per day.  We had also requested a second opinion on whether he needed lifetime amiodarone, as that was making his Parkinson's worse.  He saw Dr. Stanford Breed who discontinued the amiodarone.  He has since followed up with Dr.Gollan since he is closer to his home in [place].  The patient is now just on metoprolol for rate control.  He is exercising 3 days a week.  No falls.  No hallucinations.  Talks in his sleep.  No neck pain issues.   03/27/16 update:  The patient follows up today, with his wife who supplements the history.  He is on ropinirole, 2 mg 3 times a day and carbidopa/levodopa 25/100, 2 tablets 3 times a day.  Noting some cognitive fogginess and wife noting some memory change as well,  especially if he is under pressure.  Cognitive change described as coming and going and just not feeling well.  Pt denies falls.  Pt denies lightheadedness, near syncope.  No hallucinations.  Mood has been good but occasionally feels "sorry for myself."  In regards to RBD, states that has been good and not acting out dreams.  Exercising at [employer] but having more trouble doing this than in the past and doesn't enjoy like he used to.  07/03/16 update:  Patient is following up today, accompanied by his wife who supplements the history.  Patient remains on carbidopa/levodopa 25/100, 2 tablets 3 times per day and ropinirole, 2 mg 3 times per day.  He has not had any hallucinations.  No sleep attacks.  No compulsive behaviors.  His metoprolol was reduced in half after we talked last visit and he states that he ended up having heart palpitations so he went back up on it.  Wife thinks that the motivation/fatigue is better. He is having some dyskinesia.  Wife notices it some.   He has had no falls.  PREVIOUS MEDICATIONS: Sinemet, Requip and azilect  ALLERGIES:   Allergies  Allergen Reactions  . Codeine Other (See Comments)    Reaction: Hallucinations    CURRENT MEDICATIONS:  Outpatient Encounter Prescriptions as of 07/04/2016  Medication Sig  . carbidopa-levodopa (SINEMET IR) 25-100 MG tablet TAKE 2 TABLETS THREE TIMES DAILY  . cetirizine (ZYRTEC) 10 MG tablet Take 10 mg by mouth daily.  Marland Kitchen lisinopril (PRINIVIL,ZESTRIL) 30 MG tablet Take 1 tablet (30 mg total) by mouth daily.  . Melatonin 2.5 MG CAPS Take 1 capsule by mouth at bedtime as needed (for sleep).   . metoprolol succinate (TOPROL-XL) 25 MG 24 hr tablet TAKE 1 TABLET EVERY DAY  . Propylene Glycol (SYSTANE BALANCE OP) Apply to eye.  . rivaroxaban (XARELTO) 20 MG  TABS tablet Take 1 tablet (20 mg total) by mouth daily.  Marland Kitchen rOPINIRole (REQUIP) 2 MG tablet TAKE 1 TABLET THREE TIMES DAILY  . sildenafil (REVATIO) 20 MG tablet Take 1 tablet (20 mg  total) by mouth 3 (three) times daily as needed.  . simvastatin (ZOCOR) 40 MG tablet TAKE 1 TABLET EVERY DAY  . traMADol (ULTRAM) 50 MG tablet    No facility-administered encounter medications on file as of 07/04/2016.     PAST MEDICAL HISTORY:   Past Medical History:  Diagnosis Date  . Allergy   . Arthritis    Osteoarthritis  . Arthritis of knee   . Atrial fibrillation ([place])   . DVT (deep venous thrombosis) (HCC)    L leg  . Hyperchloremia   . Hypercholesterolemia   . Hypertension   . Nephrolithiasis   . Parkinson's disease ([place])   . Plantar wart   . Pulmonary embolism ([place])   . Seasonal allergies   . TIA (transient ischemic attack)     PAST SURGICAL HISTORY:   Past Surgical History:  Procedure Laterality Date  . APPENDECTOMY    . KNEE ARTHROSCOPY WITH MENISCAL REPAIR Right 2009  . KNEE SURGERY Right   . T&A    . TONSILLECTOMY      SOCIAL HISTORY:   Social History   Social History  . Marital status: Married    Spouse name: N/A  . Number of children: N/A  . Years of education: N/A   Occupational History  . 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 Smoking status: Never Smoker  . Smokeless tobacco: Never Used  . Alcohol use No  . Drug use: Yes    Types: Marijuana  . Sexual activity: Not on file   Other Topics Concern  . Not on file   Social History Narrative   ** Merged History Encounter **        FAMILY HISTORY:   Family Status  Relation Status  . Father Deceased   PD  . Mother Deceased   CHF  . Sister Deceased   CHF  . Sister Deceased   breast cancer  . Sister Alive   heart disease  . Sister   . Sister     ROS:  A complete 10 system review of systems was obtained and was unremarkable apart from what is mentioned above.  PHYSICAL EXAMINATION:    VITALS:   Vitals:   07/04/16 1109  BP: 122/80  Pulse: 64  SpO2: 99%  Weight: 261 lb (118.4 kg)  Height: 5\' 10"  (1.778 m)    GEN:  The patient appears stated age and  is in NAD. HEENT:  Normocephalic, atraumatic.  The mucous membranes are moist. The superficial temporal arteries are without ropiness or tenderness. CV:  Bradycardic.  Regular rhythm. Lungs:  CTAB Neck/HEME:  There are no carotid bruits bilaterally.  The neck is slightly turned into sidebending to the right.  Neurological examination:  Orientation: The patient is alert and oriented x3. Cranial nerves: There is good facial symmetry.  Extraocular muscles are intact. The visual fields are full to confrontational testing. The speech is fluent and clear. Soft palate rises symmetrically and there is no tongue deviation. Hearing is intact to conversational tone. Sensation: Sensation is intact to light touch throughout Motor: Strength is 5/5 in the bilateral upper and lower extremities.   Shoulder shrug is equal and symmetric.  There is no pronator drift.   Movement examination: Tone:  There is mild increased tone in the RUE Abnormal movements: mild dyskinesia that seems to come and go. Coordination:  There is minimal decremation with finger taps and alternation of supination/pronation in the forearm on the right. Gait and Station: The patient has no difficulty arising out of a deep-seated chair without the use of the hands. The patient's stride length is decreased with decreased arm swing and stooped posture.  He has a negative pull test.    ASSESSMENT/PLAN:  1.  Idiopathic Parkinson's disease, diagnosed in October 2013 in [place] with nonmotor symptoms that preceded this for several years.  Father had PD as well.    -continue ropinirole 2 mg 3 times.  No evidence of compulsive behaviors. Talked about whether to decrease this based on perceived cognitive change and decided to hold on that; wants to see if cardiology will reduce/discontinue metoprolol first.  Wonders if bradycardia is playing a role.  -continue carbidopa/levodopa 25/100-2 tablets 3 times per day.  Risks, benefits, side effects  and alternative therapies were discussed.  The opportunity to ask questions was given and they were answered to the best of my ability.  The patient expressed understanding and willingness to follow the outlined treatment protocols.  -Talked about the fairly new onset dyskinesia.  Offered amantadine.  He declined.  -Talked about DBS therapy now that he has had some motor fluctuations.  Discussed in detail.  He does not think that he is "mentally ready" for that right now. 2.  Constipation, associated with Parkinson's disease.  -Patient doing well with prunes, occasional miralax and has rancho recipe 3.  Hyperreflexia but neck pain better per patient  -very hyperreflexic but doesn't wish to pursue MRI cervical spine 4.  REM behavior disorder  -not on meds.  Discussed in detail today.  Wife no longer sleeps with him.  Still doesn't want meds.  Will let me know if changes mind 5.  A-fib  -back in sinus and off amiodarone, which made tremor and PD sx's worse.    -He tried to decrease metoprolol, but had more palpitations and went back up on it.  Right now, fatigue has not returned. 6.  Follow up is anticipated in the next few months, sooner should new neurologic issues arise.  Greater than 50% of the 35 minute visit in counseling.

## 2016-07-04 ENCOUNTER — Ambulatory Visit (INDEPENDENT_AMBULATORY_CARE_PROVIDER_SITE_OTHER): Payer: Medicare HMO | Admitting: Neurology

## 2016-07-04 ENCOUNTER — Encounter: Payer: Self-pay | Admitting: Neurology

## 2016-07-04 VITALS — BP 122/80 | HR 64 | Ht 70.0 in | Wt 261.0 lb

## 2016-07-04 DIAGNOSIS — I4891 Unspecified atrial fibrillation: Secondary | ICD-10-CM | POA: Diagnosis not present

## 2016-07-04 DIAGNOSIS — G4752 REM sleep behavior disorder: Secondary | ICD-10-CM

## 2016-07-04 DIAGNOSIS — G249 Dystonia, unspecified: Secondary | ICD-10-CM | POA: Diagnosis not present

## 2016-07-04 DIAGNOSIS — G20 Parkinson's disease: Secondary | ICD-10-CM | POA: Diagnosis not present

## 2016-08-19 ENCOUNTER — Other Ambulatory Visit: Payer: Self-pay | Admitting: Neurology

## 2016-08-24 NOTE — Progress Notes (Signed)
Patient ID: Thomas [name], male   DOB: 03/11/1951, 66 y.o.   MRN: [id number] Cardiology Office Note  Date:  08/26/2016   ID:  Thomas [name], DOB 1951-01-25, MRN [id number]  PCP:  [name], DO   Chief Complaint  Patient presents with  . OTHER    6 month f/u c/o fatigue and irregular heart beat. Meds reviewed verbally with pt.    HPI:  66 yo male with a history of  DVT, PE on chronic anticoagulation Parkinson's,  paroxysmal atrial fibrillation,   pulmonary embolus,  on xarelto,  No prior smoking history, no diabetes, cholesterol well controlled on a statin who presents for follow-up of his atrial fibrillation    06/23/2014 Lower extremity ultrasound revealed persistent partially occlusive thrombus in the left posterior tibial, popliteal, and mid femoral veins.     01/02/2015 Left lower extremity duplex  revealed persistent but improving nonocclusive partial thrombus in the femoral and popliteal veins.  Calf veins were patient.    Chest CT on 06/23/2014 revealed no pulmonary nodules or adenopathy.    In follow-up today, he is exercising 3 days per week, overall feels well with no complaints. No significant shortness of breath on exertion, no leg edema Tolerating anticoagulation Denies any tachycardia concerning for atrial fibrillation  Lab work reviewed with him Total chol 120 He is scheduled to follow-up with primary care for repeat lab work  EKG on today's visit shows sinus bradycardia with rate 56 bpm, no significant ST or T-wave changes  Other past medical history reviewed He did not do well on amiodarone, felt this made his tremor worse He is followed by neurology in [place], Dr. Carles Collet  Previously seen in the emergency room in the setting of upper respiratory infection, found to be in atrial fibrillation, converted in the emergency room after he was given amiodarone   Labs 3/17 showed Hgb 12.8, K 3.7, BUN/Cr 18/1.15, TSH 2.090.   Other past medical  history Echocardiogram April 2017 in [place] showed biatrial enlargement, normal LV function, mild left ventricular hypertrophy, mild aortic and mitral regurgitation.   Nuclear study April 2017 showed ejection fraction 68% and normal perfusion.   On the day he had his atrial fibrillation he noticed sudden onset of palpitations followed by discomfort in his back and arms bilaterally. This resolved when his atrial fibrillation converted to sinus rhythm. He has had no bouts since then. He has noticed that the amiodarone has exacerbated his Parkinson's.  PMH:   has a past medical history of Allergy; Arthritis; Arthritis of knee; Atrial fibrillation (HCC); DVT (deep venous thrombosis) ([place]); Hyperchloremia; Hypercholesterolemia; Hypertension; Nephrolithiasis; Parkinson's disease ([place]); Plantar wart; Pulmonary embolism ([place]); Seasonal allergies; and TIA (transient ischemic attack).  PSH:    Past Surgical History:  Procedure Laterality Date  . APPENDECTOMY    . KNEE ARTHROSCOPY WITH MENISCAL REPAIR Right 2009  . KNEE SURGERY Right   . T&A    . TONSILLECTOMY      Current Outpatient Prescriptions  Medication Sig Dispense Refill  . carbidopa-levodopa (SINEMET IR) 25-100 MG tablet TAKE 2 TABLETS THREE TIMES DAILY 540 tablet 1  . cetirizine (ZYRTEC) 10 MG tablet Take 10 mg by mouth daily.    Marland Kitchen lisinopril (PRINIVIL,ZESTRIL) 30 MG tablet Take 1 tablet (30 mg total) by mouth daily. 90 tablet 1  . Melatonin 2.5 MG CAPS Take 1 capsule by mouth at bedtime as needed (for sleep).     . metoprolol succinate (TOPROL-XL) 25 MG 24 hr tablet TAKE 1 TABLET  EVERY DAY 30 tablet 0  . Propylene Glycol (SYSTANE BALANCE OP) Apply to eye.    . rivaroxaban (XARELTO) 20 MG TABS tablet Take 1 tablet (20 mg total) by mouth daily. 90 tablet 1  . rOPINIRole (REQUIP) 2 MG tablet TAKE 1 TABLET THREE TIMES DAILY 270 tablet 1  . sildenafil (REVATIO) 20 MG tablet Take 1 tablet (20 mg total) by mouth 3 (three) times daily as  needed. 90 tablet 6  . simvastatin (ZOCOR) 40 MG tablet TAKE 1 TABLET EVERY DAY 90 tablet 1  . traMADol (ULTRAM) 50 MG tablet      No current facility-administered medications for this visit.      Allergies:   Codeine   Social History:  The patient  reports that he has never smoked. He has never used smokeless tobacco. He reports that he uses drugs, including Marijuana. He reports that he does not drink alcohol.   Family History:   family history includes Coronary artery disease in his mother; Deep vein thrombosis in his sister; Heart attack in his sister; Parkinson's disease in his father.    Review of Systems: Review of Systems  Constitutional: Negative.   Respiratory: Negative.   Cardiovascular: Negative.   Gastrointestinal: Negative.   Musculoskeletal: Negative.   Neurological: Negative.   Psychiatric/Behavioral: Negative.   All other systems reviewed and are negative.    PHYSICAL EXAM: VS:  BP 134/84 (BP Location: Left Arm, Patient Position: Sitting, Cuff Size: Normal)   Pulse (!) 56   Ht 5\' 6"  (1.676 m)   Wt 261 lb 8 oz (118.6 kg)   BMI 42.21 kg/m  , BMI Body mass index is 42.21 kg/m. GEN: Well nourished, well developed, in no acute distress  HEENT: normal  Neck: no JVD, carotid bruits, or masses Cardiac: RRR; no murmurs, rubs, or gallops,no edema  Respiratory:  clear to auscultation bilaterally, normal work of breathing GI: soft, nontender, nondistended, + BS MS: no deformity or atrophy  Skin: warm and dry, no rash Neuro:  Strength and sensation are intact Psych: euthymic mood, full affect    Recent Labs: 03/28/2016: ALT 7; BUN 13; Creatinine, Ser 1.10; Platelets 170; Potassium 4.6; Sodium 143; TSH 1.530    Lipid Panel Lab Results  Component Value Date   CHOL 121 08/23/2015   HDL 45 08/23/2015   LDLCALC 67 08/23/2015   TRIG 47 08/23/2015      Wt Readings from Last 3 Encounters:  08/26/16 261 lb 8 oz (118.6 kg)  07/04/16 261 lb (118.4 kg)   05/13/16 263 lb 10.7 oz (119.6 kg)       ASSESSMENT AND PLAN:  Paroxysmal atrial fibrillation (HCC) - Plan: EKG 12-Lead Maintaining normal sinus rhythm. No medication changes made. Tolerating anticoagulation  Hypercholesterolemia Cholesterol is at goal on the current lipid regimen. No changes to the medications were made.  Essential hypertension Blood pressure is well controlled on today's visit. No changes made to the medications.  Deep vein thrombosis (DVT) of left lower extremity, unspecified chronicity, unspecified vein (HCC) Previous ultrasound in 2016 showing mild improvement of a nonocclusive clot left popliteal vein  On chronic anticoagulation   Other chronic pulmonary embolism (HCC) On chronic anticoagulation   Parkinson's disease ([place]) Managed by neurology, exercising 3 days per week  Recommended weight loss , suggested he follow low carbohydrate diet, increasing exercise regiment     Total encounter time more than 25 minutes  Greater than 50% was spent in counseling and coordination of care with the patient  Disposition:   F/U  12 months   Orders Placed This Encounter  Procedures  . EKG 12-Lead     Signed, Esmond Plants, M.D., Ph.D. 08/26/2016  [place], [place]

## 2016-08-26 ENCOUNTER — Encounter: Payer: Self-pay | Admitting: Cardiovascular Disease

## 2016-08-26 ENCOUNTER — Ambulatory Visit (INDEPENDENT_AMBULATORY_CARE_PROVIDER_SITE_OTHER): Payer: Medicare HMO | Admitting: Cardiovascular Disease

## 2016-08-26 VITALS — BP 134/84 | HR 56 | Ht 66.0 in | Wt 261.5 lb

## 2016-08-26 DIAGNOSIS — I2782 Chronic pulmonary embolism: Secondary | ICD-10-CM

## 2016-08-26 DIAGNOSIS — E7800 Pure hypercholesterolemia, unspecified: Secondary | ICD-10-CM | POA: Diagnosis not present

## 2016-08-26 DIAGNOSIS — G20 Parkinson's disease: Secondary | ICD-10-CM

## 2016-08-26 DIAGNOSIS — I10 Essential (primary) hypertension: Secondary | ICD-10-CM | POA: Diagnosis not present

## 2016-08-26 NOTE — Patient Instructions (Addendum)

## 2016-09-01 ENCOUNTER — Other Ambulatory Visit: Payer: Self-pay | Admitting: Family Medicine

## 2016-09-02 DIAGNOSIS — B070 Plantar wart: Secondary | ICD-10-CM | POA: Diagnosis not present

## 2016-09-02 DIAGNOSIS — M79671 Pain in right foot: Secondary | ICD-10-CM | POA: Diagnosis not present

## 2016-09-02 DIAGNOSIS — M79672 Pain in left foot: Secondary | ICD-10-CM | POA: Diagnosis not present

## 2016-09-03 ENCOUNTER — Other Ambulatory Visit: Payer: Self-pay

## 2016-09-03 ENCOUNTER — Telehealth: Payer: Self-pay | Admitting: Cardiovascular Disease

## 2016-09-03 MED ORDER — METOPROLOL SUCCINATE ER 25 MG PO TB24: 25.0000 mg | ORAL_TABLET | Freq: Every day | ORAL | 3 refills | Status: DC

## 2016-09-03 NOTE — Telephone Encounter (Signed)
°*  STAT* If patient is at the pharmacy, call can be transferred to refill team.   1. Which medications need to be refilled? (please list name of each medication and dose if known)  metoprolol   2. Which pharmacy/location (including street and city if local pharmacy) is medication to be sent to? [facility] order   3. Do they need a 30 day or 90 day supply? 90 day

## 2016-09-03 NOTE — Telephone Encounter (Signed)
Requested Prescriptions   Signed Prescriptions Disp Refills  . metoprolol succinate (TOPROL XL) 25 MG 24 hr tablet 90 tablet 3    Sig: Take 1 tablet (25 mg total) by mouth daily.    Authorizing Provider: GOLLAN, TIMOTHY J    Ordering User: Mark Benecke N    

## 2016-09-03 NOTE — Telephone Encounter (Signed)
Requested Prescriptions   Signed Prescriptions Disp Refills  . metoprolol succinate (TOPROL XL) 25 MG 24 hr tablet 90 tablet 3    Sig: Take 1 tablet (25 mg total) by mouth daily.    Authorizing Provider: GOLLAN, TIMOTHY J    Ordering User: Lander Eslick N    

## 2016-09-09 ENCOUNTER — Other Ambulatory Visit: Payer: Self-pay | Admitting: Family Medicine

## 2016-09-18 DIAGNOSIS — M79672 Pain in left foot: Secondary | ICD-10-CM | POA: Diagnosis not present

## 2016-09-18 DIAGNOSIS — M79671 Pain in right foot: Secondary | ICD-10-CM | POA: Diagnosis not present

## 2016-09-18 DIAGNOSIS — B070 Plantar wart: Secondary | ICD-10-CM | POA: Diagnosis not present

## 2016-09-25 NOTE — Progress Notes (Signed)
BP 134/85   Pulse (!) 56   Temp 98.3 F (36.8 C)   Ht 5\' 6"  (1.676 m)   Wt 259 lb 11.2 oz (117.8 kg)   SpO2 97%   BMI 41.92 kg/m    Subjective:    Patient ID: Thomas [name], male    DOB: 12/07/1950, 66 y.o.   MRN: [id number]  HPI: Thomas [name] is a 66 y.o. male  Chief Complaint  Patient presents with  . Hypertension  . Hyperlipidemia   HYPERTENSION / HYPERLIPIDEMIA Satisfied with current treatment? yes Duration of hypertension: chronic BP monitoring frequency: not checking BP medication side effects: no Past BP meds:  Duration of hyperlipidemia: chronic Cholesterol medication side effects: no Cholesterol supplements: none Past cholesterol medications: simvastatin Medication compliance: excellent compliance Aspirin: no Recent stressors: no Recurrent headaches: no Visual changes: no Palpitations: no Dyspnea: no Chest pain: no Lower extremity edema: no Dizzy/lightheaded: no  Relevant past medical, surgical, family and social history reviewed and updated as indicated. Interim medical history since our last visit reviewed. Allergies and medications reviewed and updated.  Review of Systems  Constitutional: Negative.   Respiratory: Negative.   Cardiovascular: Negative.   Psychiatric/Behavioral: Negative.     Per HPI unless specifically indicated above     Objective:    BP 134/85   Pulse (!) 56   Temp 98.3 F (36.8 C)   Ht 5\' 6"  (1.676 m)   Wt 259 lb 11.2 oz (117.8 kg)   SpO2 97%   BMI 41.92 kg/m   Wt Readings from Last 3 Encounters:  09/26/16 259 lb 11.2 oz (117.8 kg)  08/26/16 261 lb 8 oz (118.6 kg)  07/04/16 261 lb (118.4 kg)    Physical Exam  Constitutional: He is oriented to person, place, and time. He appears well-developed and well-nourished. No distress.  HENT:  Head: Normocephalic and atraumatic.  Right Ear: Hearing normal.  Left Ear: Hearing normal.  Nose: Nose normal.  Eyes: Conjunctivae and lids are normal. Right eye exhibits no  discharge. Left eye exhibits no discharge. No scleral icterus.  Cardiovascular: Normal rate, regular rhythm, normal heart sounds and intact distal pulses.  Exam reveals no gallop and no friction rub.   No murmur heard. Pulmonary/Chest: Effort normal and breath sounds normal. No respiratory distress. He has no wheezes. He has no rales. He exhibits no tenderness.  Musculoskeletal: Normal range of motion.  Neurological: He is alert and oriented to person, place, and time.  Skin: Skin is warm, dry and intact. No rash noted. He is not diaphoretic. No erythema. No pallor.  Psychiatric: He has a normal mood and affect. His speech is normal and behavior is normal. Judgment and thought content normal. Cognition and memory are normal.  Nursing note and vitals reviewed.   Results for orders placed or performed in visit on 04/15/16  Cologuard  Result Value Ref Range   Cologuard Negative       Assessment & Plan:   Problem List Items Addressed This Visit      Cardiovascular and Mediastinum   Hypertension - Primary    Under good control. Continue current regimen. Continue to monitor. Call with any concerns.       Relevant Medications   simvastatin (ZOCOR) 40 MG tablet   Other Relevant Orders   Comprehensive metabolic panel   DVT (deep venous thrombosis) (HCC)    On xarelto. Continue to follow with hematology. Continue to monitor. Checking CBC today.      Relevant Medications  simvastatin (ZOCOR) 40 MG tablet   Pulmonary embolism ([place])    On xarelto. Continue to follow with hematology. Continue to monitor. Checking CBC today.      Relevant Medications   simvastatin (ZOCOR) 40 MG tablet   Atrial fibrillation (HCC)    Continue to follow with cardiology. Continue xarelto. Checking CBC today.      Relevant Medications   simvastatin (ZOCOR) 40 MG tablet   Other Relevant Orders   Comprehensive metabolic panel   CBC with Differential/Platelet     Nervous and Auditory   Parkinson's  disease ([place])    Stable. Continue to follow with Dr. Carles Collet. Call with any concerns.         Other   Hypercholesterolemia    Under good control. Continue current regimen. Continue to monitor. Call with any concerns.       Relevant Medications   simvastatin (ZOCOR) 40 MG tablet   Other Relevant Orders   Comprehensive metabolic panel   Lipid Panel w/o Chol/HDL Ratio       Follow up plan: Return in about 6 months (around 03/28/2017) for Physical.

## 2016-09-26 ENCOUNTER — Ambulatory Visit (INDEPENDENT_AMBULATORY_CARE_PROVIDER_SITE_OTHER): Payer: Medicare HMO | Admitting: Family Medicine

## 2016-09-26 ENCOUNTER — Encounter: Payer: Self-pay | Admitting: Family Medicine

## 2016-09-26 VITALS — BP 134/85 | HR 56 | Temp 98.3°F | Ht 66.0 in | Wt 259.7 lb

## 2016-09-26 DIAGNOSIS — E7800 Pure hypercholesterolemia, unspecified: Secondary | ICD-10-CM

## 2016-09-26 DIAGNOSIS — I82502 Chronic embolism and thrombosis of unspecified deep veins of left lower extremity: Secondary | ICD-10-CM | POA: Diagnosis not present

## 2016-09-26 DIAGNOSIS — I480 Paroxysmal atrial fibrillation: Secondary | ICD-10-CM | POA: Diagnosis not present

## 2016-09-26 DIAGNOSIS — G20 Parkinson's disease: Secondary | ICD-10-CM

## 2016-09-26 DIAGNOSIS — I2782 Chronic pulmonary embolism: Secondary | ICD-10-CM | POA: Diagnosis not present

## 2016-09-26 DIAGNOSIS — I10 Essential (primary) hypertension: Secondary | ICD-10-CM

## 2016-09-26 MED ORDER — SIMVASTATIN 40 MG PO TABS: 40.0000 mg | ORAL_TABLET | Freq: Every day | ORAL | 1 refills | Status: DC

## 2016-09-26 NOTE — Assessment & Plan Note (Signed)
Under good control. Continue current regimen. Continue to monitor. Call with any concerns. 

## 2016-09-26 NOTE — Assessment & Plan Note (Signed)
Stable. Continue to follow with Dr. Carles Collet. Call with any concerns.

## 2016-09-26 NOTE — Assessment & Plan Note (Signed)
On xarelto. Continue to follow with hematology. Continue to monitor. Checking CBC today.

## 2016-09-26 NOTE — Assessment & Plan Note (Signed)
Continue to follow with cardiology. Continue xarelto. Checking CBC today.

## 2016-09-27 LAB — COMPREHENSIVE METABOLIC PANEL
ALK PHOS: 69 IU/L (ref 39–117)
ALT: 12 IU/L (ref 0–44)
AST: 16 IU/L (ref 0–40)
Albumin/Globulin Ratio: 1.7 (ref 1.2–2.2)
Albumin: 4.3 g/dL (ref 3.6–4.8)
BILIRUBIN TOTAL: 0.5 mg/dL (ref 0.0–1.2)
BUN/Creatinine Ratio: 16 (ref 10–24)
BUN: 17 mg/dL (ref 8–27)
CHLORIDE: 104 mmol/L (ref 96–106)
CO2: 24 mmol/L (ref 20–29)
Calcium: 9.5 mg/dL (ref 8.6–10.2)
Creatinine, Ser: 1.05 mg/dL (ref 0.76–1.27)
GFR calc Af Amer: 84 mL/min/{1.73_m2} (ref >59)
GFR calc non Af Amer: 73 mL/min/{1.73_m2} (ref >59)
GLUCOSE: 101 mg/dL — AB (ref 65–99)
Globulin, Total: 2.6 g/dL (ref 1.5–4.5)
Potassium: 4.6 mmol/L (ref 3.5–5.2)
Sodium: 141 mmol/L (ref 134–144)
TOTAL PROTEIN: 6.9 g/dL (ref 6.0–8.5)

## 2016-09-27 LAB — LIPID PANEL W/O CHOL/HDL RATIO
CHOLESTEROL TOTAL: 127 mg/dL (ref 100–199)
HDL: 45 mg/dL (ref >39)
LDL Calculated: 64 mg/dL (ref 0–99)
TRIGLYCERIDES: 92 mg/dL (ref 0–149)
VLDL CHOLESTEROL CAL: 18 mg/dL (ref 5–40)

## 2016-09-27 LAB — CBC WITH DIFFERENTIAL/PLATELET
Basophils Absolute: 0 10*3/uL (ref 0.0–0.2)
Basos: 1 %
EOS (ABSOLUTE): 0.1 10*3/uL (ref 0.0–0.4)
EOS: 2 %
HEMATOCRIT: 43.8 % (ref 37.5–51.0)
Hemoglobin: 14.7 g/dL (ref 13.0–17.7)
Immature Grans (Abs): 0 10*3/uL (ref 0.0–0.1)
Immature Granulocytes: 0 %
LYMPHS ABS: 1.1 10*3/uL (ref 0.7–3.1)
Lymphs: 20 %
MCH: 30.2 pg (ref 26.6–33.0)
MCHC: 33.6 g/dL (ref 31.5–35.7)
MCV: 90 fL (ref 79–97)
MONOS ABS: 0.4 10*3/uL (ref 0.1–0.9)
Monocytes: 7 %
NEUTROS ABS: 3.9 10*3/uL (ref 1.4–7.0)
Neutrophils: 70 %
Platelets: 166 10*3/uL (ref 150–379)
RBC: 4.86 x10E6/uL (ref 4.14–5.80)
RDW: 13.3 % (ref 12.3–15.4)
WBC: 5.5 10*3/uL (ref 3.4–10.8)

## 2016-09-29 ENCOUNTER — Encounter: Payer: Self-pay | Admitting: Family Medicine

## 2016-11-11 ENCOUNTER — Inpatient Hospital Stay: Payer: Medicare HMO | Admitting: Hematology and Oncology

## 2016-11-18 DIAGNOSIS — M79672 Pain in left foot: Secondary | ICD-10-CM | POA: Diagnosis not present

## 2016-11-18 DIAGNOSIS — M79671 Pain in right foot: Secondary | ICD-10-CM | POA: Diagnosis not present

## 2016-11-18 DIAGNOSIS — B070 Plantar wart: Secondary | ICD-10-CM | POA: Diagnosis not present

## 2016-11-20 ENCOUNTER — Ambulatory Visit: Payer: Medicare HMO | Admitting: Neurology

## 2016-11-21 NOTE — Progress Notes (Signed)
Thomas [name] was seen today in the movement disorders clinic for neurologic consultation at the request of Thomas Liter P, DO.  The consultation is for the evaluation of PD.  He is accompanied by his wife who supplements the history.  The patient has seen Thomas [name] at [facility] and I do have those notes.  The patient was dx with PD in Oct 2013 but first sx's probably started in the summer of 2013 with slowness of movement, R greater than L.  He does state that non-motor sx's like constipation occurred several years prior to this.  He was in [place] at the time.  He was initially started on Azilect in October 2013 and about 6 months later requip was added.  He was started initially at requip XL 2 mg and has worked up to requip XL 6 mg (the last increase was 10/2013).  He noticed a benefit with the requip but the XL is very expensive and asks about that.  He initially had some mental foginess with requip but that is better. Also asks about azilect as hasn't seen a benefit and is costing him $300.  Levodopa was then added by Dr. Arletha [name] in 03/2014.  It helped with cramping, rigidity.  He takes it at 4 am (but then goes back to sleep until 6am), 12pm, 8pm (bed at 8:30pm). He exercises faithfully.  He has neck turning to the right and has since the dx.  Some pain.     Specific Other Symptoms:  Tremor: Yes.  , only with pushing/pulling something or with stress Voice: weaker than in the past Sleep: sleeps well  Vivid Dreams:  Yes.    Acting out dreams:  Yes.   Wet Pillows: Yes.   (occasionally) Postural symptoms:  No. (unless gets up quick)  Falls?  No. Bradykinesia symptoms: slow movements; able to get up better now that on levodopa Loss of smell:  yes Loss of taste:  No. Urinary Incontinence: minimal - better with levodopa Difficulty Swallowing:  Yes.   but better now (would choke on saliva) Handwriting, micrographia: Yes.   (R hand dominant) Trouble with ADL's:  No. (shaving is easier now  that on levodopa; wife helps put on belt)  Trouble buttoning clothing: No. Depression:  No. Memory changes:  Yes.   but minor Hallucinations:  No.  visual distortions:rare N/V:  No. Lightheaded:  No.  Syncope: No. Diplopia:  No. Dyskinesia:  No.  Neuroimaging has previously been performed.  It is not available for my review today.  This was done in [place] prior to the diagnosis.  The patient does state that he had a TIA in 2007.  He was working in the yard on a hot day and he had just gone in and he sat down and felt a pain in the right side and tinnitus in the right ear and he had R face droop on the way to the hospital and R arm was "curling" on the way to the hospital.  He states that he was out of the window for TPA but was given IV heparin but was back to normal by 4 am (started at 5:30 pm but was at hospital by 6:30 pm so was unclear why out of TPA window).    10/26/14 update:  The patient returns today for follow-up, accompanied by his wife who supplements the history.  Last visit, changed him from Requip XL, 6 mg daily to ropinirole 2 mg 3 times per day, solely because of cost.  His Azilect was also discontinued because of cost.  His levodopa was increased to carbidopa/levodopa 25/100, 1-1/2 tablets 3 times per day.  I planned to order an MRI of the brain and cervical spine, but this was canceled by the patient because of cost.  Today, the patient states that he feels better overall and his gait is better; he no longer feels "foggy."  He is exercising.  He is doing cardio at [employer] and doing weights and cardio.  No falls.  Occ lightheadedness.  No syncope.  No hallucinations.  Very vivid dreams.  Is talking in sleep.  No hitting wife at night.  Not falling out bed.  "I feel better than I have in years."  02/27/15 update:  The patient is following up today regarding his Parkinson's disease.  He is currently on carbidopa/levodopa 25/100, 1-1/2 tablets 3 times per day in addition to  ropinirole, 2 mg 3 times per day (5-6am/11-12/and then q hs).  He had one fall on 12/30/14 since last visit.  He tripped over an uneven place in the sidewalk.  He states that he had rib pain but has recovered.  No syncope or lightheadedness.  No hallucinations.  Having very vivid dreams.  He will flail the arms and scream out but has not gotten hurt.  Wife states that it is actually a little better than in the past.  He started on melatonin and that helped his sleep.  He is noting stiffness in the neck and has trouble looking up.  He points to discomfort in the trapezius.  Late in the evening, he feels that his legs will "twitch" and he cannot sit still.  If he moves around, it will go away.    Asks me about changing his levitra to viagra.    06/27/15 update:  The patient is following up today regarding his Parkinson's disease.  He is currently on carbidopa/levodopa 25/100, 1-1/2 tablets 3 times per day in addition to ropinirole, 2 mg 3 times per day (5-6am/11-12/and then q hs).  States that he strained his back a few weeks ago (just moved wrong) and therefore and hasn't been as active.  Also has noted some tightness in his neck/trap and it has caused some nausea.  Doesn't think that it is at all related to wearing off of medication.  Still going to [employer] but not as vigorous with exercise.  No falls.  No lightheadedness.  Wife moved bedrooms but she can still sometimes hear him acting out the dream.    08/02/15 update:  The patient is following up today, much earlier than expected. This patient is accompanied in the office by his spouse who supplements the history. I have reviewed prior records made available to me.  He remains on Requip, 2 mg 3 times per day as well as carbidopa/levodopa 25/100, 1-1/2 tablets 3 times per day.  He presented to the emergency room on 07/03/2015 with community-acquired pneumonia and was treated with Levaquin.  He was taking an over-the-counter decongestant and NyQuil for his  symptoms.  He went back to the emergency room on 07/06/2015 with palpitations and it was determined that he was in atrial fibrillation and it was thought that it was due to the over-the-counter decongestant.  He spontaneously converted back to normal sinus rhythm in the ED.  The emergency room physician consulted with the cardiologist over the telephone and started him on amiodarone.  The patient was to follow-up with the cardiologist.  I do not have any  other cardiology records.  The patient states that he did a nuclear stress test and "passed that."  He is going to leave him on the amiodarone lifetime per the patient.   Pt states that since he was put on that he has been sluggish.  He is having some lightheadedness especially in the AM and it has affected his AM workout routine.  He feels that his PD has worsened and feels more tremulous.  He was going to get LSVT, but states that the therapy was very expensive for him.  11/26/15 update:  The patient follows up today, accompanied by his wife who supplements the history.  I have had the opportunity to review prior records made available to me.  He remains on ropinirole, 2 mgrams 3 times a day and last visit I increased his carbidopa/levodopa 25/100-2 tablets 3 times per day.  We had also requested a second opinion on whether he needed lifetime amiodarone, as that was making his Parkinson's worse.  He saw Dr. Stanford Breed who discontinued the amiodarone.  He has since followed up with Dr.Gollan since he is closer to his home in [place].  The patient is now just on metoprolol for rate control.  He is exercising 3 days a week.  No falls.  No hallucinations.  Talks in his sleep.  No neck pain issues.   03/27/16 update:  The patient follows up today, with his wife who supplements the history.  He is on ropinirole, 2 mg 3 times a day and carbidopa/levodopa 25/100, 2 tablets 3 times a day.  Noting some cognitive fogginess and wife noting some memory change as well,  especially if he is under pressure.  Cognitive change described as coming and going and just not feeling well.  Pt denies falls.  Pt denies lightheadedness, near syncope.  No hallucinations.  Mood has been good but occasionally feels "sorry for myself."  In regards to RBD, states that has been good and not acting out dreams.  Exercising at [employer] but having more trouble doing this than in the past and doesn't enjoy like he used to.  07/03/16 update:  Patient is following up today, accompanied by his wife who supplements the history.  Patient remains on carbidopa/levodopa 25/100, 2 tablets 3 times per day and ropinirole, 2 mg 3 times per day.  He has not had any hallucinations.  No sleep attacks.  No compulsive behaviors.  His metoprolol was reduced in half after we talked last visit and he states that he ended up having heart palpitations so he went back up on it.  Wife thinks that the motivation/fatigue is better. He is having some dyskinesia.  Wife notices it some.   He has had no falls.  11/25/16 update:  Patient seen today for his Parkinson's disease, accompanied by his wife who supplements the history.  Patient is on ropinirole, 2 mg 3 times per day and carbidopa/levodopa 25/100, 2 tablets 3 times per day (6am/12pm/5-6pm).  Pt denies falls.  Pt denies lightheadedness, near syncope.  No hallucinations.  Mood has been fair (not been able to exercise as much as he would like).  In regards to dyskinesia he states that he is doing about the same .  He had a plantar wart removed and so had trouble exercising but he is getting better.  No new medical problems.  He is losing weight through diet.    PREVIOUS MEDICATIONS: Sinemet, Requip and azilect  ALLERGIES:   Allergies  Allergen Reactions  . Codeine  Other (See Comments)    Reaction: Hallucinations    CURRENT MEDICATIONS:  Outpatient Encounter Prescriptions as of 11/25/2016  Medication Sig  . carbidopa-levodopa (SINEMET IR) 25-100 MG tablet TAKE 2  TABLETS THREE TIMES DAILY  . cetirizine (ZYRTEC) 10 MG tablet Take 10 mg by mouth daily.  Marland Kitchen lisinopril (PRINIVIL,ZESTRIL) 30 MG tablet TAKE 1 TABLET (30 MG TOTAL) BY MOUTH DAILY.  . Melatonin 2.5 MG CAPS Take 1 capsule by mouth at bedtime as needed (for sleep).   . metoprolol succinate (TOPROL-XL) 25 MG 24 hr tablet Take 1 tablet (25 mg total) by mouth daily.  Marland Kitchen Propylene Glycol (SYSTANE BALANCE OP) Apply to eye.  Marland Kitchen rOPINIRole (REQUIP) 2 MG tablet TAKE 1 TABLET THREE TIMES DAILY  . sildenafil (REVATIO) 20 MG tablet Take 1 tablet (20 mg total) by mouth 3 (three) times daily as needed.  . simvastatin (ZOCOR) 40 MG tablet Take 1 tablet (40 mg total) by mouth daily.  . traMADol (ULTRAM) 50 MG tablet   . XARELTO 20 MG TABS tablet TAKE 1 TABLET (20 MG TOTAL) BY MOUTH DAILY.  Marland Kitchen bleomycin (BLEOCIN) 15 units injection For use in physician office   No facility-administered encounter medications on file as of 11/25/2016.     PAST MEDICAL HISTORY:   Past Medical History:  Diagnosis Date  . Allergy   . Arthritis    Osteoarthritis  . Arthritis of knee   . Atrial fibrillation ([place])   . DVT (deep venous thrombosis) (HCC)    L leg  . Hyperchloremia   . Hypercholesterolemia   . Hypertension   . Nephrolithiasis   . Parkinson's disease ([place])   . Plantar wart   . Pulmonary embolism ([place])   . Seasonal allergies   . TIA (transient ischemic attack)     PAST SURGICAL HISTORY:   Past Surgical History:  Procedure Laterality Date  . APPENDECTOMY    . KNEE ARTHROSCOPY WITH MENISCAL REPAIR Right 2009  . KNEE SURGERY Right   . T&A    . TONSILLECTOMY      SOCIAL HISTORY:   Social History   Social History  . Marital status: Married    Spouse name: N/A  . Number of children: N/A  . Years of education: N/A   Occupational History  . 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 Smoking status: Never Smoker  . Smokeless tobacco: Never Used  . Alcohol use No  . Drug use: Yes     Types: Marijuana  . Sexual activity: Not on file   Other Topics Concern  . Not on file   Social History Narrative   ** Merged History Encounter **        FAMILY HISTORY:   Family Status  Relation Status  . Father Deceased       PD  . Mother Deceased       CHF  . Sister Deceased       CHF  . Sister Deceased       breast cancer  . Sister Alive       heart disease  . Sister (Not Specified)  . Sister (Not Specified)    ROS:  A complete 10 system review of systems was obtained and was unremarkable apart from what is mentioned above.  PHYSICAL EXAMINATION:    VITALS:   Vitals:   11/25/16 1020  BP: 130/82  Pulse: 60  SpO2: 96%  Weight: 250 lb (113.4 kg)  Height: 5'  10" (1.778 m)   Wt Readings from Last 3 Encounters:  11/25/16 250 lb (113.4 kg)  09/26/16 259 lb 11.2 oz (117.8 kg)  08/26/16 261 lb 8 oz (118.6 kg)     GEN:  The patient appears stated age and is in NAD. HEENT:  Normocephalic, atraumatic.  The mucous membranes are moist. The superficial temporal arteries are without ropiness or tenderness. CV:  Bradycardic.  Regular rhythm. Lungs:  CTAB Neck/HEME:  There are no carotid bruits bilaterally.  The neck is slightly turned into sidebending to the right.  Neurological examination:  Orientation: The patient is alert and oriented x3. Cranial nerves: There is good facial symmetry.  Extraocular muscles are intact. The visual fields are full to confrontational testing. The speech is fluent and clear. Soft palate rises symmetrically and there is no tongue deviation. Hearing is intact to conversational tone. Sensation: Sensation is intact to light touch throughout Motor: Strength is 5/5 in the bilateral upper and lower extremities.   Shoulder shrug is equal and symmetric.  There is no pronator drift.   Movement examination: Tone: There is mild increased tone in the RUE (states that it is time for medication) Abnormal movements:  No dyskinesia Coordination:   There is decremation with finger taps and alternation of supination/pronation in the forearm on the right. Gait and Station: The patient has mild difficulty arising out of a deep-seated chair without the use of the hands and is able to arise on the 2nd attempt. The patient's stride length is decreased with decreased arm swing and stooped posture.  He has a negative pull test.    ASSESSMENT/PLAN:  1.  Idiopathic Parkinson's disease, diagnosed in October 2013 in [place] with nonmotor symptoms that preceded this for several years.  Father had PD as well.    -continue ropinirole 2 mg 3 times.  No evidence of compulsive behaviors. Talked about whether to decrease this based on perceived cognitive change and decided to hold on that; wants to see if cardiology will reduce/discontinue metoprolol first.  Wonders if bradycardia is playing a role.  -increase carbidopa/levodopa 25/100 as follows:  2 tablets at 6am/ 2 tablets at 10am/ 2 tablets at 2pm/1 tablet at 6pm.   This will move medication closer together and add one additional dosage.  Will need to watch for increased dyskinesia.   The opportunity to ask questions was given and they were answered to the best of my ability.  The patient expressed understanding and willingness to follow the outlined treatment protocols.  -has occasional dyskinesia but doesn't want medication. Will let me know if changes mind.  -Talked about DBS therapy now that he has had some motor fluctuations.  Discussed in detail.  He does not think that he is "mentally ready" for that right now. 2.  Constipation, associated with Parkinson's disease.  -Patient doing well with prunes, occasional miralax and has rancho recipe 3.  Hyperreflexia but neck pain better per patient  -very hyperreflexic but doesn't wish to pursue MRI cervical spine 4.  REM behavior disorder  -not on meds.  Discussed in detail today.  Wife no longer sleeps with him.  Still doesn't want meds.  Will let me  know if changes mind 5.  A-fib  -back in sinus and off amiodarone, which made tremor and PD sx's worse.    -He tried to decrease metoprolol, but had more palpitations and went back up on it.  More fatigued again and thinks that he may try.  He is bradycardic 6.  Follow up is anticipated in the next few months, sooner should new neurologic issues arise.  Much greater than 50% of this visit was spent in counseling and coordinating care.  Total face to face time:  30 min

## 2016-11-25 ENCOUNTER — Ambulatory Visit (INDEPENDENT_AMBULATORY_CARE_PROVIDER_SITE_OTHER): Payer: Medicare HMO | Admitting: Neurology

## 2016-11-25 ENCOUNTER — Encounter: Payer: Self-pay | Admitting: Neurology

## 2016-11-25 VITALS — BP 130/82 | HR 60 | Ht 70.0 in | Wt 250.0 lb

## 2016-11-25 DIAGNOSIS — G20 Parkinson's disease: Secondary | ICD-10-CM

## 2016-11-25 DIAGNOSIS — G249 Dystonia, unspecified: Secondary | ICD-10-CM | POA: Diagnosis not present

## 2016-11-25 NOTE — Patient Instructions (Signed)
Try carbidopa/levodopa 25/100 as follows:  2 tablets at 6am/ 2 tablets at 10am/ 2 tablets at 2pm/1 tablet at 6pm.   Continue ropinirole 2 mg three times per day (you can take this at same time as three of the levodopa dosages)

## 2017-01-19 ENCOUNTER — Other Ambulatory Visit: Payer: Self-pay | Admitting: Family Medicine

## 2017-02-10 ENCOUNTER — Ambulatory Visit: Payer: Medicare HMO

## 2017-02-13 ENCOUNTER — Ambulatory Visit (INDEPENDENT_AMBULATORY_CARE_PROVIDER_SITE_OTHER): Payer: Medicare HMO

## 2017-02-13 DIAGNOSIS — Z23 Encounter for immunization: Secondary | ICD-10-CM

## 2017-03-11 ENCOUNTER — Telehealth: Payer: Self-pay | Admitting: Neurology

## 2017-03-11 NOTE — Telephone Encounter (Signed)
Left message on machine for patient to call back.  To see if he can move his appt from 04/03/17 (Botox day) to one of two options: 03/27/17 at 11:15 am or 04/01/17 at 8:45 am. Awaiting call back to discuss.

## 2017-03-11 NOTE — Telephone Encounter (Signed)
Spoke with patient's wife and moved appt.

## 2017-03-26 NOTE — Progress Notes (Signed)
Thomas [name] was seen today in the movement disorders clinic for neurologic consultation at the request of [name], DO.  The consultation is for the evaluation of PD.  He is accompanied by his wife who supplements the history.  The patient has seen [name] at [facility] and I do have those notes.  The patient was dx with PD in Oct 2013 but first sx's probably started in the summer of 2013 with slowness of movement, R greater than L.  He does state that non-motor sx's like constipation occurred several years prior to this.  He was in [place] at the time.  He was initially started on Azilect in October 2013 and about 6 months later requip was added.  He was started initially at requip XL 2 mg and has worked up to requip XL 6 mg (the last increase was 10/2013).  He noticed a benefit with the requip but the XL is very expensive and asks about that.  He initially had some mental foginess with requip but that is better. Also asks about azilect as hasn't seen a benefit and is costing him $300.  Levodopa was then added by Dr. [name] in 03/2014.  It helped with cramping, rigidity.  He takes it at 4 am (but then goes back to sleep until 6am), 12pm, 8pm (bed at 8:30pm). He exercises faithfully.  He has neck turning to the right and has since the dx.  Some pain.     Specific Other Symptoms:  Tremor: Yes.  , only with pushing/pulling something or with stress Voice: weaker than in the past Sleep: sleeps well  Vivid Dreams:  Yes.    Acting out dreams:  Yes.   Wet Pillows: Yes.   (occasionally) Postural symptoms:  No. (unless gets up quick)  Falls?  No. Bradykinesia symptoms: slow movements; able to get up better now that on levodopa Loss of smell:  yes Loss of taste:  No. Urinary Incontinence: minimal - better with levodopa Difficulty Swallowing:  Yes.   but better now (would choke on saliva) Handwriting, micrographia: Yes.   (R hand dominant) Trouble with ADL's:  No. (shaving is easier now  that on levodopa; wife helps put on belt)  Trouble buttoning clothing: No. Depression:  No. Memory changes:  Yes.   but minor Hallucinations:  No.  visual distortions:rare N/V:  No. Lightheaded:  No.  Syncope: No. Diplopia:  No. Dyskinesia:  No.  Neuroimaging has previously been performed.  It is not available for my review today.  This was done in [place] prior to the diagnosis.  The patient does state that he had a TIA in 2007.  He was working in the yard on a hot day and he had just gone in and he sat down and felt a pain in the right side and tinnitus in the right ear and he had R face droop on the way to the hospital and R arm was "curling" on the way to the hospital.  He states that he was out of the window for TPA but was given IV heparin but was back to normal by 4 am (started at 5:30 pm but was at hospital by 6:30 pm so was unclear why out of TPA window).    10/26/14 update:  The patient returns today for follow-up, accompanied by his wife who supplements the history.  Last visit, changed him from Requip XL, 6 mg daily to ropinirole 2 mg 3 times per day, solely because of cost.  His Azilect was also discontinued because of cost.  His levodopa was increased to carbidopa/levodopa 25/100, 1-1/2 tablets 3 times per day.  I planned to order an MRI of the brain and cervical spine, but this was canceled by the patient because of cost.  Today, the patient states that he feels better overall and his gait is better; he no longer feels "foggy."  He is exercising.  He is doing cardio at [employer] and doing weights and cardio.  No falls.  Occ lightheadedness.  No syncope.  No hallucinations.  Very vivid dreams.  Is talking in sleep.  No hitting wife at night.  Not falling out bed.  "I feel better than I have in years."  02/27/15 update:  The patient is following up today regarding his Parkinson's disease.  He is currently on carbidopa/levodopa 25/100, 1-1/2 tablets 3 times per day in addition to  ropinirole, 2 mg 3 times per day (5-6am/11-12/and then q hs).  He had one fall on 12/30/14 since last visit.  He tripped over an uneven place in the sidewalk.  He states that he had rib pain but has recovered.  No syncope or lightheadedness.  No hallucinations.  Having very vivid dreams.  He will flail the arms and scream out but has not gotten hurt.  Wife states that it is actually a little better than in the past.  He started on melatonin and that helped his sleep.  He is noting stiffness in the neck and has trouble looking up.  He points to discomfort in the trapezius.  Late in the evening, he feels that his legs will "twitch" and he cannot sit still.  If he moves around, it will go away.    Asks me about changing his levitra to viagra.    06/27/15 update:  The patient is following up today regarding his Parkinson's disease.  He is currently on carbidopa/levodopa 25/100, 1-1/2 tablets 3 times per day in addition to ropinirole, 2 mg 3 times per day (5-6am/11-12/and then q hs).  States that he strained his back a few weeks ago (just moved wrong) and therefore and hasn't been as active.  Also has noted some tightness in his neck/trap and it has caused some nausea.  Doesn't think that it is at all related to wearing off of medication.  Still going to [employer] but not as vigorous with exercise.  No falls.  No lightheadedness.  Wife moved bedrooms but she can still sometimes hear him acting out the dream.    08/02/15 update:  The patient is following up today, much earlier than expected. This patient is accompanied in the office by his spouse who supplements the history. I have reviewed prior records made available to me.  He remains on Requip, 2 mg 3 times per day as well as carbidopa/levodopa 25/100, 1-1/2 tablets 3 times per day.  He presented to the emergency room on 07/03/2015 with community-acquired pneumonia and was treated with Levaquin.  He was taking an over-the-counter decongestant and NyQuil for his  symptoms.  He went back to the emergency room on 07/06/2015 with palpitations and it was determined that he was in atrial fibrillation and it was thought that it was due to the over-the-counter decongestant.  He spontaneously converted back to normal sinus rhythm in the ED.  The emergency room physician consulted with the cardiologist over the telephone and started him on amiodarone.  The patient was to follow-up with the cardiologist.  I do not have any  other cardiology records.  The patient states that he did a nuclear stress test and "passed that."  He is going to leave him on the amiodarone lifetime per the patient.   Pt states that since he was put on that he has been sluggish.  He is having some lightheadedness especially in the AM and it has affected his AM workout routine.  He feels that his PD has worsened and feels more tremulous.  He was going to get LSVT, but states that the therapy was very expensive for him.  11/26/15 update:  The patient follows up today, accompanied by his wife who supplements the history.  I have had the opportunity to review prior records made available to me.  He remains on ropinirole, 2 mgrams 3 times a day and last visit I increased his carbidopa/levodopa 25/100-2 tablets 3 times per day.  We had also requested a second opinion on whether he needed lifetime amiodarone, as that was making his Parkinson's worse.  He saw Dr. Stanford Breed who discontinued the amiodarone.  He has since followed up with Dr.Gollan since he is closer to his home in [place].  The patient is now just on metoprolol for rate control.  He is exercising 3 days a week.  No falls.  No hallucinations.  Talks in his sleep.  No neck pain issues.   03/27/16 update:  The patient follows up today, with his wife who supplements the history.  He is on ropinirole, 2 mg 3 times a day and carbidopa/levodopa 25/100, 2 tablets 3 times a day.  Noting some cognitive fogginess and wife noting some memory change as well,  especially if he is under pressure.  Cognitive change described as coming and going and just not feeling well.  Pt denies falls.  Pt denies lightheadedness, near syncope.  No hallucinations.  Mood has been good but occasionally feels "sorry for myself."  In regards to RBD, states that has been good and not acting out dreams.  Exercising at [employer] but having more trouble doing this than in the past and doesn't enjoy like he used to.  07/03/16 update:  Patient is following up today, accompanied by his wife who supplements the history.  Patient remains on carbidopa/levodopa 25/100, 2 tablets 3 times per day and ropinirole, 2 mg 3 times per day.  He has not had any hallucinations.  No sleep attacks.  No compulsive behaviors.  His metoprolol was reduced in half after we talked last visit and he states that he ended up having heart palpitations so he went back up on it.  Wife thinks that the motivation/fatigue is better. He is having some dyskinesia.  Wife notices it some.   He has had no falls.  11/25/16 update:  Patient seen today for his Parkinson's disease, accompanied by his wife who supplements the history.  Patient is on ropinirole, 2 mg 3 times per day and carbidopa/levodopa 25/100, 2 tablets 3 times per day (6am/12pm/5-6pm).  Pt denies falls.  Pt denies lightheadedness, near syncope.  No hallucinations.  Mood has been fair (not been able to exercise as much as he would like).  In regards to dyskinesia he states that he is doing about the same .  He had a plantar wart removed and so had trouble exercising but he is getting better.  No new medical problems.  He is losing weight through diet.    03/27/17 update: Patient seen today in follow-up for Parkinson's disease.  He is accompanied by his wife  who supplements the history.  Patient remains on ropinirole, 2 mg 3 times per day.  He has had no sleep attacks.  No compulsive behaviors.  He is also on carbidopa/levodopa 25/100, 2 tablets 3 times per day.  Pt  denies falls.  Pt denies lightheadedness, near syncope.  No hallucinations.  Mood has been good.  Going to [employer] 3 days a week and works out 2 other days a week.  Wife concerned about memory change.  Has some word finding trouble.  Never really forgets his medication.  Sometimes has to be reminded, but generally takes it on his own.  PREVIOUS MEDICATIONS: Sinemet, Requip and azilect  ALLERGIES:   Allergies  Allergen Reactions  . Codeine Other (See Comments)    Reaction: Hallucinations    CURRENT MEDICATIONS:  Outpatient Encounter Medications as of 03/27/2017  Medication Sig  . bleomycin (BLEOCIN) 15 units injection For use in physician office  . carbidopa-levodopa (SINEMET IR) 25-100 MG tablet TAKE 2 TABLETS THREE TIMES DAILY (Patient taking differently: 2 tabs at 6 am. 2 tabs at 10 am. 2 tabs at 2 pm. 1 tab at 6 pm.)  . cetirizine (ZYRTEC) 10 MG tablet Take 10 mg by mouth daily.  Marland Kitchen lisinopril (PRINIVIL,ZESTRIL) 30 MG tablet TAKE 1 TABLET EVERY DAY  . Melatonin 2.5 MG CAPS Take 1 capsule by mouth at bedtime as needed (for sleep).   . metoprolol succinate (TOPROL-XL) 25 MG 24 hr tablet Take 1 tablet (25 mg total) by mouth daily.  Marland Kitchen Propylene Glycol (SYSTANE BALANCE OP) Apply to eye.  Marland Kitchen rOPINIRole (REQUIP) 2 MG tablet TAKE 1 TABLET THREE TIMES DAILY  . sildenafil (REVATIO) 20 MG tablet Take 1 tablet (20 mg total) by mouth 3 (three) times daily as needed.  . simvastatin (ZOCOR) 40 MG tablet Take 1 tablet (40 mg total) by mouth daily.  . traMADol (ULTRAM) 50 MG tablet   . XARELTO 20 MG TABS tablet TAKE 1 TABLET (20 MG TOTAL) BY MOUTH DAILY.   No facility-administered encounter medications on file as of 03/27/2017.     PAST MEDICAL HISTORY:   Past Medical History:  Diagnosis Date  . Allergy   . Arthritis    Osteoarthritis  . Arthritis of knee   . Atrial fibrillation ([place])   . DVT (deep venous thrombosis) ([facility])    L leg  . Hyperchloremia   . Hypercholesterolemia   . Hypertension     . Nephrolithiasis   . Parkinson's disease ([place])   . Plantar wart   . Pulmonary embolism ([place])   . Seasonal allergies   . TIA (transient ischemic attack)     PAST SURGICAL HISTORY:   Past Surgical History:  Procedure Laterality Date  . APPENDECTOMY    . KNEE ARTHROSCOPY WITH MENISCAL REPAIR Right 2009  . KNEE SURGERY Right   . T&A    . TONSILLECTOMY      SOCIAL HISTORY:   Social History   Socioeconomic History  . Marital status: Married    Spouse name: Not on file  . Number of children: Not on file  . Years of education: Not on file  . Highest education level: Not on file  Social Needs  . Financial resource strain: Not on file  . Food insecurity - worry: Not on file  . Food insecurity - inability: Not on file  . Transportation needs - medical: Not on file  . Transportation needs - non-medical: Not on file  Occupational History  . Occupation: retired  Comment: machinest  Tobacco Use  . Smoking status: Never Smoker  . Smokeless tobacco: Never Used  Substance and Sexual Activity  . Alcohol use: No    Alcohol/week: 0.0 oz  . Drug use: Yes    Types: Marijuana  . Sexual activity: Not on file  Other Topics Concern  . Not on file  Social History Narrative   ** Merged History Encounter **        FAMILY HISTORY:   Family Status  Relation Name Status  . Father  Deceased       PD  . Mother  Deceased       CHF  . Sister  Deceased       CHF  . Sister  Deceased       breast cancer  . Sister  Alive       heart disease  . Sister  (Not Specified)  . Sister  (Not Specified)    ROS:  A complete 10 system review of systems was obtained and was unremarkable apart from what is mentioned above.  PHYSICAL EXAMINATION:    VITALS:   Vitals:   03/27/17 1105  BP: 120/80  Pulse: 60  SpO2: 98%  Weight: 248 lb (112.5 kg)  Height: 5\' 10"  (1.778 m)   Wt Readings from Last 3 Encounters:  03/27/17 248 lb (112.5 kg)  11/25/16 250 lb (113.4 kg)  09/26/16 259 lb 11.2  oz (117.8 kg)     GEN:  The patient appears stated age and is in NAD. HEENT:  Normocephalic, atraumatic.  The mucous membranes are moist. The superficial temporal arteries are without ropiness or tenderness. CV:  Bradycardic.  Regular rhythm. Lungs:  CTAB Neck/HEME:  There are no carotid bruits bilaterally.  The neck is slightly turned into sidebending to the right.  Neurological examination:  Orientation: The patient is alert and oriented x3. Cranial nerves: There is good facial symmetry.  Extraocular muscles are intact. The visual fields are full to confrontational testing. The speech is fluent and clear. Soft palate rises symmetrically and there is no tongue deviation. Hearing is intact to conversational tone. Sensation: Sensation is intact to light touch throughout Motor: Strength is 5/5 in the bilateral upper and lower extremities.   Shoulder shrug is equal and symmetric.  There is no pronator drift.   Movement examination: Tone: There is very mild increased tone in the right upper extremity. Abnormal movements: There is mild axial dyskinesia. Coordination:  There is decremation with finger taps and alternation of supination/pronation in the forearm on the right. Gait and Station: The patient has no difficulty today arising without the use of his hands.  The patient's stride length is good today with improved arm swing.  He has a negative pull test.    ASSESSMENT/PLAN:  1.  Idiopathic Parkinson's disease, diagnosed in October 2013 in [place] with nonmotor symptoms that preceded this for several years.  Father had PD as well.    -continue ropinirole 2 mg 3 times.  No evidence of compulsive behaviors. Talked about whether to decrease this based on perceived cognitive change and decided to hold on that  -increase carbidopa/levodopa 25/100 as follows:  2 tablets at 6am/ 2 tablets at 10am/ 2 tablets at 2pm/1 tablet at 6pm.   This will move medication closer together and add one  additional dosage.  Will need to watch for increased dyskinesia.   The opportunity to ask questions was given and they were answered to the best of my ability.  The patient expressed understanding and willingness to follow the outlined treatment protocols.  -Has occasional dyskinesia.  This does not bother him.  We will continue to monitor.  -Talked about DBS therapy now that he has had some motor fluctuations.  Discussed in detail.  He does not think that he is "mentally ready" for that right now.  -Congratulated the patient on all of the boxing that he has gotten involved with.  He is really doing a great job with exercise.  His wife has gotten involved in caregiver groups.  I invited her to the caregiver support group that we will be starting.  Talked about the power over Parkinson's group at [facility]. 2.  Constipation, associated with Parkinson's disease.  -Patient doing well with prunes, occasional miralax and has rancho recipe 3.  Hyperreflexia but neck pain better per patient  -very hyperreflexic but doesn't wish to pursue MRI cervical spine 4.  REM behavior disorder  -not on meds.  Discussed in detail today.  Wife no longer sleeps with him.  Still doesn't want meds.  Will let me know if changes mind 5.  A-fib  -back in sinus and off amiodarone, which made tremor and PD sx's worse.    -He tried to decrease metoprolol, but had more palpitations and went back up on it.  6. Memory change  -doubt PDD.  However, discussed neurocognitive testing in detail.  This may be important, because we could decrease the ropinirole.  He and his wife are going to think about this. 7.  Follow up is anticipated in the next few months, sooner should new neurologic issues arise.  Much greater than 50% of this visit was spent in counseling and coordinating care.  Total face to face time:  30 min

## 2017-03-27 ENCOUNTER — Ambulatory Visit: Payer: Medicare HMO | Admitting: Neurology

## 2017-03-27 ENCOUNTER — Encounter: Payer: Self-pay | Admitting: Neurology

## 2017-03-27 VITALS — BP 120/80 | HR 60 | Ht 70.0 in | Wt 248.0 lb

## 2017-03-27 DIAGNOSIS — G20 Parkinson's disease: Secondary | ICD-10-CM | POA: Diagnosis not present

## 2017-03-27 DIAGNOSIS — R413 Other amnesia: Secondary | ICD-10-CM | POA: Diagnosis not present

## 2017-03-27 NOTE — Patient Instructions (Signed)
Parkinson's Caregiver Group   Save the Date: First meeting  will be on May 11, 2017  from 2-3:30  Parkinson's disease can be challenging for caregivers too. Changing  abilities and assuming new roles within the family can cause emotional  upheaval. Meeting with a group of peers who are experiencing similar changes is very beneficial for any caregiver. This professionally led support group will provide a safe place for Parkinson's caregivers to connect, share challenges, seek solutions, learn about resources and strengthen coping skills.    When:  Last Monday of the month (unless date falls on a holiday)  2019 Dates: 1/28, 2/25, 3/25, 4/29, 5/20, 6/24, 7/29, 8/26, 9/30, 10/28, 11/25, 12/30   Location: [facility]                                                                                              [place], [place]                                                                                      Room 204 Time: 2-3:30   Please call Myra Gianotti, MSW, LCSW at [phone] with any questions.

## 2017-03-30 ENCOUNTER — Ambulatory Visit: Payer: Medicare HMO

## 2017-03-30 ENCOUNTER — Encounter: Payer: Medicare HMO | Admitting: Family Medicine

## 2017-03-30 NOTE — Assessment & Plan Note (Deleted)
Stable. Continue to follow with neurology. Call with any concerns. Continue exercise.

## 2017-03-30 NOTE — Assessment & Plan Note (Deleted)
Under good control on current regimen. Continue current regimen. Continue to monitor. Call with any concerns. Refills given today.   

## 2017-03-30 NOTE — Assessment & Plan Note (Deleted)
Stable. Following with hematology. Checking CBC today. Continue xarelto- on it for life.

## 2017-03-30 NOTE — Assessment & Plan Note (Deleted)
Stable. Following with cardiology. Checking CBC today. Continue xarelto.

## 2017-03-30 NOTE — Progress Notes (Deleted)
There were no vitals taken for this visit.   Subjective:    Patient ID: Thomas [name], male    DOB: 1951/01/05, 66 y.o.   MRN: [id number]  HPI: Thomas [name] is a 66 y.o. male presenting on 03/30/2017 for comprehensive medical examination. Had his annual wellness earlier with the nurse health advisor. Current medical complaints include:  Has been following with Dr. Carles Collet for his Parkinson's disease. Has been doing pretty well. Staying stable. Is having some word finding issues, but Dr. Carles Collet doesn't think that's from his Parkinsons.   HYPERTENSION / HYPERLIPIDEMIA Satisfied with current treatment? {Blank single:19197::"yes","no"} Duration of hypertension: {Blank single:19197::"chronic","months","years"} BP monitoring frequency: {Blank single:19197::"not checking","rarely","daily","weekly","monthly","a few times a day","a few times a week","a few times a month"} BP range:  BP medication side effects: {Blank single:19197::"yes","no"} Past BP meds: {Blank QVZDGLOV:56433::"IRJJ","OACZYSAYTK","ZSWFUXNATF/TDDUKGURKY","HCWCBJSE","GBTDVVOHYW","VPXTGGYIRS/WNIO","EVOJJKKXFG (bystolic)","carvedilol","chlorthalidone","clonidine","diltiazem","exforge HCT","HCTZ","irbesartan (avapro)","labetalol","lisinopril","lisinopril-HCTZ","losartan (cozaar)","methyldopa","nifedipine","olmesartan (benicar)","olmesartan-HCTZ","quinapril","ramipril","spironalactone","tekturna","valsartan","valsartan-HCTZ","verapamil"} Duration of hyperlipidemia: {Blank single:19197::"chronic","months","years"} Cholesterol medication side effects: {Blank single:19197::"yes","no"} Cholesterol supplements: {Blank multiple:19196::"none","fish oil","niacin","red yeast rice"} Past cholesterol medications: {Blank multiple:19196::"none","atorvastain (lipitor)","lovastatin (mevacor)","pravastatin (pravachol)","rosuvastatin (crestor)","simvastatin (zocor)","vytorin","fenofibrate (tricor)","gemfibrozil","ezetimide (zetia)","niaspan","lovaza"} Medication  compliance: {Blank single:19197::"excellent compliance","good compliance","fair compliance","poor compliance"} Aspirin: {Blank single:19197::"yes","no"} Recent stressors: {Blank single:19197::"yes","no"} Recurrent headaches: {Blank single:19197::"yes","no"} Visual changes: {Blank single:19197::"yes","no"} Palpitations: {Blank single:19197::"yes","no"} Dyspnea: {Blank single:19197::"yes","no"} Chest pain: {Blank single:19197::"yes","no"} Lower extremity edema: {Blank single:19197::"yes","no"} Dizzy/lightheaded: {Blank single:19197::"yes","no"}   He currently lives with: wife Interim Problems from his last visit: no  Depression Screen done today and results listed below:  Depression screen [facility] 2/9 03/28/2016 08/23/2015  Decreased Interest 0 0  Down, Depressed, Hopeless 0 0  PHQ - 2 Score 0 0  Altered sleeping 1 -  Tired, decreased energy 0 -  Change in appetite 0 -  Feeling bad or failure about yourself  1 -  Trouble concentrating 0 -  Moving slowly or fidgety/restless 1 -  Suicidal thoughts 0 -  PHQ-9 Score 3 -    Past Medical History:  Past Medical History:  Diagnosis Date  . Allergy   . Arthritis    Osteoarthritis  . Arthritis of knee   . Atrial fibrillation ([place])   . DVT (deep venous thrombosis) (HCC)    L leg  . Hyperchloremia   . Hypercholesterolemia   . Hypertension   . Nephrolithiasis   . Parkinson's disease ([place])   . Plantar wart   . Pulmonary embolism ([place])   . Seasonal allergies   . TIA (transient ischemic attack)     Surgical History:  Past Surgical History:  Procedure Laterality Date  . APPENDECTOMY    . KNEE ARTHROSCOPY WITH MENISCAL REPAIR Right 2009  . KNEE SURGERY Right   . T&A    . TONSILLECTOMY      Medications:  Current Outpatient Medications on File Prior to Visit  Medication Sig  . bleomycin (BLEOCIN) 15 units injection For use in physician office  . carbidopa-levodopa (SINEMET IR) 25-100 MG tablet TAKE 2 TABLETS THREE TIMES DAILY  (Patient taking differently: 2 tabs at 6 am. 2 tabs at 10 am. 2 tabs at 2 pm. 1 tab at 6 pm.)  . cetirizine (ZYRTEC) 10 MG tablet Take 10 mg by mouth daily.  Marland Kitchen lisinopril (PRINIVIL,ZESTRIL) 30 MG tablet TAKE 1 TABLET EVERY DAY  . Melatonin 2.5 MG CAPS Take 1 capsule by mouth at bedtime as needed (for sleep).   . metoprolol succinate (TOPROL-XL) 25 MG 24 hr tablet Take 1 tablet (25 mg total) by mouth daily.  Marland Kitchen Propylene Glycol (SYSTANE BALANCE OP) Apply to eye.  Marland Kitchen rOPINIRole (REQUIP) 2 MG tablet TAKE 1 TABLET  THREE TIMES DAILY  . sildenafil (REVATIO) 20 MG tablet Take 1 tablet (20 mg total) by mouth 3 (three) times daily as needed.  . simvastatin (ZOCOR) 40 MG tablet Take 1 tablet (40 mg total) by mouth daily.  . traMADol (ULTRAM) 50 MG tablet   . XARELTO 20 MG TABS tablet TAKE 1 TABLET (20 MG TOTAL) BY MOUTH DAILY.   No current facility-administered medications on file prior to visit.     Allergies:  Allergies  Allergen Reactions  . Codeine Other (See Comments)    Reaction: Hallucinations    Social History:  Social History   Socioeconomic History  . Marital status: Married    Spouse name: Not on file  . Number of children: Not on file  . Years of education: Not on file  . Highest education level: Not on file  Social Needs  . Financial resource strain: Not on file  . Food insecurity - worry: Not on file  . Food insecurity - inability: Not on file  . Transportation needs - medical: Not on file  . Transportation needs - non-medical: Not on file  Occupational History  . Occupation: retired    Comment: machinest  Tobacco Use  . Smoking status: Never Smoker  . Smokeless tobacco: Never Used  Substance and Sexual Activity  . Alcohol use: No    Alcohol/week: 0.0 oz  . Drug use: Yes    Types: Marijuana  . Sexual activity: Not on file  Other Topics Concern  . Not on file  Social History Narrative   ** Merged History Encounter **       Social History   Tobacco Use    Smoking Status Never Smoker  Smokeless Tobacco Never Used   Social History   Substance and Sexual Activity  Alcohol Use No  . Alcohol/week: 0.0 oz    Family History:  Family History  Problem Relation Age of Onset  . Parkinson's disease Father   . Coronary artery disease Mother   . Heart attack Sister   . Deep vein thrombosis Sister     Past medical history, surgical history, medications, allergies, family history and social history reviewed with patient today and changes made to appropriate areas of the chart.   ROS  All other ROS negative except what is listed above and in the HPI.      Objective:    There were no vitals taken for this visit.  Wt Readings from Last 3 Encounters:  03/27/17 248 lb (112.5 kg)  11/25/16 250 lb (113.4 kg)  09/26/16 259 lb 11.2 oz (117.8 kg)    Physical Exam  Results for orders placed or performed in visit on 09/26/16  Comprehensive metabolic panel  Result Value Ref Range   Glucose 101 (H) 65 - 99 mg/dL   BUN 17 8 - 27 mg/dL   Creatinine, Ser 1.05 0.76 - 1.27 mg/dL   GFR calc non Af Amer 73 >59 mL/min/1.73   GFR calc Af Amer 84 >59 mL/min/1.73   BUN/Creatinine Ratio 16 10 - 24   Sodium 141 134 - 144 mmol/L   Potassium 4.6 3.5 - 5.2 mmol/L   Chloride 104 96 - 106 mmol/L   CO2 24 20 - 29 mmol/L   Calcium 9.5 8.6 - 10.2 mg/dL   Total Protein 6.9 6.0 - 8.5 g/dL   Albumin 4.3 3.6 - 4.8 g/dL   Globulin, Total 2.6 1.5 - 4.5 g/dL   Albumin/Globulin Ratio 1.7 1.2 - 2.2   Bilirubin Total  0.5 0.0 - 1.2 mg/dL   Alkaline Phosphatase 69 39 - 117 IU/L   AST 16 0 - 40 IU/L   ALT 12 0 - 44 IU/L  Lipid Panel w/o Chol/HDL Ratio  Result Value Ref Range   Cholesterol, Total 127 100 - 199 mg/dL   Triglycerides 92 0 - 149 mg/dL   HDL 45 >39 mg/dL   VLDL Cholesterol Cal 18 5 - 40 mg/dL   LDL Calculated 64 0 - 99 mg/dL  CBC with Differential/Platelet  Result Value Ref Range   WBC 5.5 3.4 - 10.8 x10E3/uL   RBC 4.86 4.14 - 5.80 x10E6/uL    Hemoglobin 14.7 13.0 - 17.7 g/dL   Hematocrit 43.8 37.5 - 51.0 %   MCV 90 79 - 97 fL   MCH 30.2 26.6 - 33.0 pg   MCHC 33.6 31.5 - 35.7 g/dL   RDW 13.3 12.3 - 15.4 %   Platelets 166 150 - 379 x10E3/uL   Neutrophils 70 Not Estab. %   Lymphs 20 Not Estab. %   Monocytes 7 Not Estab. %   Eos 2 Not Estab. %   Basos 1 Not Estab. %   Neutrophils Absolute 3.9 1.4 - 7.0 x10E3/uL   Lymphocytes Absolute 1.1 0.7 - 3.1 x10E3/uL   Monocytes Absolute 0.4 0.1 - 0.9 x10E3/uL   EOS (ABSOLUTE) 0.1 0.0 - 0.4 x10E3/uL   Basophils Absolute 0.0 0.0 - 0.2 x10E3/uL   Immature Granulocytes 0 Not Estab. %   Immature Grans (Abs) 0.0 0.0 - 0.1 x10E3/uL      Assessment & Plan:   Problem List Items Addressed This Visit      Cardiovascular and Mediastinum   Hypertension    Under good control on current regimen. Continue current regimen. Continue to monitor. Call with any concerns. Refills given today.      DVT (deep venous thrombosis) (HCC)    Stable. Following with hematology. Checking CBC today. Continue xarelto- on it for life.       Pulmonary embolism (HCC)    Stable. Following with hematology. Checking CBC today. Continue xarelto- on it for life.       Atrial fibrillation (HCC)    Stable. Following with cardiology. Checking CBC today. Continue xarelto.         Nervous and Auditory   Parkinson's disease ([place])    Stable. Continue to follow with neurology. Call with any concerns. Continue exercise.         Genitourinary   Erectile dysfunction    Under good control on current regimen. Continue current regimen. Continue to monitor. Call with any concerns. Refills given today.        Other   Hypercholesterolemia    Under good control on current regimen. Continue current regimen. Continue to monitor. Call with any concerns. Refills given today.       Other Visit Diagnoses    Medicare annual wellness visit, subsequent    -  Primary   Preventative care discussed today as below. Call with  any concerns.    Routine general medical examination at a health care facility       Vaccines updated. Screening labs checked today. Colonoscopy ordered. Continue diet and exercise. Call with any concerns.    Screening for prostate cancer           Discussed aspirin prophylaxis for myocardial infarction prevention and decision was it was not indicated  LABORATORY TESTING:  Health maintenance labs ordered today as discussed above.   The natural  history of prostate cancer and ongoing controversy regarding screening and potential treatment outcomes of prostate cancer has been discussed with the patient. The meaning of a false positive PSA and a false negative PSA has been discussed. He indicates understanding of the limitations of this screening test and wishes to proceed with screening PSA testing.   IMMUNIZATIONS:   - Tdap: Tetanus vaccination status reviewed: last tetanus booster within 10 years. - Influenza: Up to date - Pneumovax: {Blank single:19197::"Up to date","Administered today","Not applicable","Refused","Given elsewhere"} - Prevnar: {Blank single:19197::"Up to date","Administered today","Not applicable","Refused","Given elsewhere"} - Zostavax vaccine: Up to date  SCREENING: - Colonoscopy: {Blank single:19197::"Up to date","Ordered today","Not applicable","Refused","Done elsewhere"}  Discussed with patient purpose of the colonoscopy is to detect colon cancer at curable precancerous or early stages    PATIENT COUNSELING:    Sexuality: Discussed sexually transmitted diseases, partner selection, use of condoms, avoidance of unintended pregnancy  and contraceptive alternatives.   Advised to avoid cigarette smoking.  I discussed with the patient that most people either abstain from alcohol or drink within safe limits (<=14/week and <=4 drinks/occasion for males, <=7/weeks and <= 3 drinks/occasion for females) and that the risk for alcohol disorders and other health effects rises  proportionally with the number of drinks per week and how often a drinker exceeds daily limits.  Discussed cessation/primary prevention of drug use and availability of treatment for abuse.   Diet: Encouraged to adjust caloric intake to maintain  or achieve ideal body weight, to reduce intake of dietary saturated fat and total fat, to limit sodium intake by avoiding high sodium foods and not adding table salt, and to maintain adequate dietary potassium and calcium preferably from fresh fruits, vegetables, and low-fat dairy products.    stressed the importance of regular exercise  Injury prevention: Discussed safety belts, safety helmets, smoke detector, smoking near bedding or upholstery.   Dental health: Discussed importance of regular tooth brushing, flossing, and dental visits.   Follow up plan: NEXT PREVENTATIVE PHYSICAL DUE IN 1 YEAR. No Follow-up on file.

## 2017-04-03 ENCOUNTER — Ambulatory Visit: Payer: Medicare HMO | Admitting: Neurology

## 2017-04-17 DIAGNOSIS — I82502 Chronic embolism and thrombosis of unspecified deep veins of left lower extremity: Secondary | ICD-10-CM | POA: Diagnosis not present

## 2017-04-27 ENCOUNTER — Inpatient Hospital Stay: Payer: Medicare HMO | Attending: Hematology and Oncology | Admitting: Hematology and Oncology

## 2017-04-27 ENCOUNTER — Encounter: Payer: Self-pay | Admitting: Hematology and Oncology

## 2017-04-27 ENCOUNTER — Ambulatory Visit: Payer: Medicare HMO

## 2017-04-27 VITALS — BP 163/85 | HR 52 | Temp 96.9°F | Resp 18 | Wt 246.0 lb

## 2017-04-27 DIAGNOSIS — Z7901 Long term (current) use of anticoagulants: Secondary | ICD-10-CM | POA: Insufficient documentation

## 2017-04-27 DIAGNOSIS — I82502 Chronic embolism and thrombosis of unspecified deep veins of left lower extremity: Secondary | ICD-10-CM

## 2017-04-27 DIAGNOSIS — Z86718 Personal history of other venous thrombosis and embolism: Secondary | ICD-10-CM

## 2017-04-27 DIAGNOSIS — Z86711 Personal history of pulmonary embolism: Secondary | ICD-10-CM | POA: Diagnosis not present

## 2017-04-27 DIAGNOSIS — G20 Parkinson's disease: Secondary | ICD-10-CM | POA: Insufficient documentation

## 2017-04-27 NOTE — Progress Notes (Signed)
[place] [facility] day:  04/27/2017   Chief Complaint: Thomas [name] is a 67 y.o. male with a distant history of cerebrovascular accident and a recent history of deep venous thrombosis and pulmonary embolism who is seen for 1 year  assessment on Xarelto.  HPI: The patient was last seen in the medical oncology clinic on 05/13/2016.  At that time, he was doing well.  He was exercising at [employer].  Exam was unremarkable.  He continued Xarelto.  During the interim, he has done well.  He notes a little more side effect from the Parkinson's medications.  He is walking and working out at [employer].  He is lifting weights, doing cardio and stretching.  He notes swelling in his left leg.  He wishes to continue blood thinners (Xarelto).  He gets labs checked regularly with Dr. [name].   Past Medical History:  Diagnosis Date  . Allergy   . Arthritis    Osteoarthritis  . Arthritis of knee   . Atrial fibrillation ([place])   . DVT (deep venous thrombosis) (HCC)    L leg  . Hyperchloremia   . Hypercholesterolemia   . Hypertension   . Nephrolithiasis   . Parkinson's disease ([place])   . Plantar wart   . Pulmonary embolism ([place])   . Seasonal allergies   . TIA (transient ischemic attack)     Past Surgical History:  Procedure Laterality Date  . APPENDECTOMY    . KNEE ARTHROSCOPY WITH MENISCAL REPAIR Right 2009  . KNEE SURGERY Right   . T&A    . TONSILLECTOMY      Family History  Problem Relation Age of Onset  . Parkinson's disease Father   . Coronary artery disease Mother   . Heart attack Sister   . Deep vein thrombosis Sister     Social History:  reports that  has never smoked. he has never used smokeless tobacco. He reports that he uses drugs. Drug: Marijuana. He reports that he does not drink alcohol.  He works out at [employer] 3 times a week.  The patient is accompanied by his wife, Thomas [name], today.  Allergies:  Allergies  Allergen Reactions   . Codeine Other (See Comments)    Reaction: Hallucinations   Current Medications: Current Outpatient Medications  Medication Sig Dispense Refill  . bleomycin (BLEOCIN) 15 units injection For use in physician office    . carbidopa-levodopa (SINEMET IR) 25-100 MG tablet TAKE 2 TABLETS THREE TIMES DAILY (Patient taking differently: 2 tabs at 6 am. 2 tabs at 10 am. 2 tabs at 2 pm. 1 tab at 6 pm.) 540 tablet 1  . cetirizine (ZYRTEC) 10 MG tablet Take 10 mg by mouth daily.    Marland Kitchen lisinopril (PRINIVIL,ZESTRIL) 30 MG tablet TAKE 1 TABLET EVERY DAY 90 tablet 1  . Melatonin 2.5 MG CAPS Take 1 capsule by mouth at bedtime as needed (for sleep).     . metoprolol succinate (TOPROL-XL) 25 MG 24 hr tablet Take 1 tablet (25 mg total) by mouth daily. 90 tablet 3  . Propylene Glycol (SYSTANE BALANCE OP) Apply to eye.    Marland Kitchen rOPINIRole (REQUIP) 2 MG tablet TAKE 1 TABLET THREE TIMES DAILY 270 tablet 1  . sildenafil (REVATIO) 20 MG tablet Take 1 tablet (20 mg total) by mouth 3 (three) times daily as needed. 90 tablet 6  . simvastatin (ZOCOR) 40 MG tablet Take 1 tablet (40 mg total) by mouth daily. 90 tablet 1  .  traMADol (ULTRAM) 50 MG tablet     . XARELTO 20 MG TABS tablet TAKE 1 TABLET (20 MG TOTAL) BY MOUTH DAILY. 90 tablet 1   No current facility-administered medications for this visit.    Review of Systems:  GENERAL:  Feels "ok".  No fevers or sweats or weight loss. PERFORMANCE STATUS (ECOG):  1-2 HEENT:  No visual changes, runny nose, sore throat, mouth sores or tenderness. Lungs: No shortness of breath or cough.  No hemoptysis. Cardiac:  No chest pain, palpitations, orthopnea, or PND. GI:  No nausea, vomiting, diarrhea, constipation, melena or hematochezia. GU:  No urgency, frequency, dysuria, or hematuria. Musculoskeletal:  Knee problem (flares up occasionally, less frequent).  [name] back issues (worse if stands up long period).  No muscle tenderness. Extremities:  No pain or swelling. Skin:  No rashes  or skin changes. Neuro:  Parkinson's.  No headache, numbness or weakness, balance or coordination issues. Endocrine:  No diabetes, thyroid issues, hot flashes or night sweats. Psych:  Restless sleep.  No mood changes, depression or anxiety. Pain:  No focal pain. Review of systems:  All other systems reviewed and found to be negative.  Physical Exam: There were no vitals taken for this visit. GENERAL:  Well developed, well nourished, gentleman sitting comfortably in the exam room in no acute distress. MENTAL STATUS:  Alert and oriented to person, place and time. HEAD:  Pearline Cables short hair.  Normocephalic, atraumatic, face symmetric, no Cushingoid features. EYES:  Glasses.  Blue eyes.  Pupils equal round and reactive to light and accomodation.  No conjunctivitis or scleral icterus. ENT:  Oropharynx clear without lesion.  Tongue normal. Mucous membranes moist.  RESPIRATORY:  Clear to auscultation without rales, wheezes or rhonchi. CARDIOVASCULAR:  Regular rate and rhythm without murmur, rub or gallop. ABDOMEN:  Soft, non-tender, with active bowel sounds, and no hepatosplenomegaly.  No masses. SKIN:  No rashes, ulcers or lesions. EXTREMITIES:  Slight left lower extremity edema.  No skin discoloration or tenderness.  No palpable cords. LYMPH NODES: No palpable cervical, supraclavicular, axillary or inguinal adenopathy  NEUROLOGICAL: Unremarkable. PSYCH:  Appropriate.   [facility] Labs:   12/26/2014:  Hematocrit 43.1, hemoglobin 14.6, MCV 89, platelets 195,000, white count 6200 with an ANC of 4100.  Comprehensive metabolic panel was normal with a creatinine of 1.15.  PSA was 7.5 (high) with a repeat value of 2.3 on 09/26/2014. 03/01/2015:  Hematocrit of 40.8, hemoglobin 13.8, MCV 88, platelets 177,000, white count 6800 with an ANC of 4700. Comprehensive  metabolic panel was normal with a creatinine of 1.07. 10/30/2015:  Hematocrit of 43.1, hemoglobin 14.4, MCV 89, platelets 187,000, white count 5400 with  an ANC of 3500. D dimers were 0.50 (0-0.49). 05/05/2016:  Hematocrit of 42.6, hemoglobin 14.0, MCV 90, platelets 194,000, white count 8600 with an ANC of 6400. Comprehensive  metabolic panel was normal with a creatinine of 1.06.  Liver function tests were normal.  D-dimer was 0.54 (0-0.49). 04/17/2017:  Hematocrit of 41.6, hemoglobin 14.2, MCV 87, platelets 175,000, white count 6400 with an ANC of 4600. Comprehensive  metabolic panel was normal with a creatinine of 1.06.  Liver function tests were normal.  D-dimer was 0.45 (0-0.49).   Assessment:  Thomas [name] is a 67 y.o. male with a history of a cerebral vascular accident in 2007 and a history of deep venous thrombosis and pulmonary embolism on 12/07/2013.  There were no precipitating events to his PE and DVT.  He was placed on Xarelto.  Work-up on  05/19/2014 revealed negative Factor V Leiden, beta-2 glycoprotein, and anticardiolipin antibodies. Protein S activity and antigen, and antithrombin III activity were normal.  Additional testing on 06/14/2014 revealed a normal CBC, CMP, protein C antigen and activity, prothrombin gene mutation, and lupus anticoagulant panel.  D-dimers were elevated indicating active clot breakdown.  Lower extremity duplex on 06/23/2014 revealed persistent partially occlusive thrombus in the left posterior tibial, popliteal, and mid femoral veins.   Left lower extremity duplex on 01/02/2015 revealed persistent but improving nonocclusive partial thrombus in the femoral and popliteal veins.  Calf veins were patient.  Chest CT on 06/23/2014 revealed no pulmonary nodules or adenopathy.  PSA was normal on 09/26/2014.    He has been on Xarelto since 11/2013.  He denies any bruising or bleeding.   Symptomatically, he has side effects from his Parkinson's medications.  He is exercising at [employer].  Exam is unremarkable.  Plan: 1. Review labs from 05/05/2016.  CBC and CMP are normal.  D-dimers are normal. 2. Discuss patient's  thoughts about continuation of anticoagulation.  Patient wishes to continue. 3. Continue Xarelto.  4. Discuss regular follow-up with Dr. [name]. 5. RTC prn.   Lequita Asal, MD  04/27/2017, 5:35 PM

## 2017-04-29 ENCOUNTER — Other Ambulatory Visit: Payer: Self-pay | Admitting: Neurology

## 2017-05-01 ENCOUNTER — Ambulatory Visit (INDEPENDENT_AMBULATORY_CARE_PROVIDER_SITE_OTHER): Payer: Medicare HMO

## 2017-05-01 VITALS — BP 130/72 | HR 58 | Temp 98.3°F | Resp 16 | Ht 70.0 in | Wt 247.1 lb

## 2017-05-01 DIAGNOSIS — Z23 Encounter for immunization: Secondary | ICD-10-CM

## 2017-05-01 DIAGNOSIS — Z0000 Encounter for general adult medical examination without abnormal findings: Secondary | ICD-10-CM | POA: Diagnosis not present

## 2017-05-01 NOTE — Progress Notes (Signed)
Subjective:   Thomas [name] is a 67 y.o. male who presents for Medicare Annual/Subsequent preventive examination.  Review of Systems:   Cardiac Risk Factors include: hypertension;dyslipidemia;obesity (BMI >30kg/m2);advanced age (>16men, >41 women);male gender     Objective:    Vitals: BP 130/72 (BP Location: Left Arm, Patient Position: Sitting)   Pulse (!) 58   Temp 98.3 F (36.8 C) (Temporal)   Resp 16   Ht 5\' 10"  (1.778 m)   Wt 247 lb 1.6 oz (112.1 kg)   BMI 35.46 kg/m   Body mass index is 35.46 kg/m.  Advanced Directives 05/01/2017 04/27/2017 05/13/2016 03/28/2016 11/01/2015 07/06/2015 01/05/2015  Does Patient Have a Medical Advance Directive? Yes No Yes Yes Yes Yes No  Type of Paramedic of [place];Living will - - Healthcare Power of [place] in Chart? No - copy requested - - No - copy requested - - -  Would patient like information on creating a medical advance directive? - - - - - - -    Tobacco Social History   Tobacco Use  Smoking Status Never Smoker  Smokeless Tobacco Never Used     Counseling given: Not Answered   Clinical Intake:  Pre-visit preparation completed: Yes  Pain : No/denies pain     Nutritional Status: BMI > 30  Obese Nutritional Risks: None  How often do you need to have someone help you when you read instructions, pamphlets, or other written materials from your doctor or pharmacy?: 1 - Never What is the last grade level you completed in school?: 2 years college   Interpreter Needed?: No  Information entered by :: Tiffany Hill,LPN   Past Medical History:  Diagnosis Date  . Allergy   . Arthritis    Osteoarthritis  . Arthritis of knee   . Atrial fibrillation ([place])   . DVT (deep venous thrombosis) ([facility])    L leg  . Hyperchloremia   . Hypercholesterolemia   . Hypertension   . Nephrolithiasis   . Parkinson's disease ([place])   . Plantar  wart   . Pulmonary embolism ([place])   . Seasonal allergies   . TIA (transient ischemic attack)    Past Surgical History:  Procedure Laterality Date  . APPENDECTOMY    . KNEE ARTHROSCOPY WITH MENISCAL REPAIR Right 2009  . KNEE SURGERY Right   . T&A    . TONSILLECTOMY     Family History  Problem Relation Age of Onset  . Parkinson's disease Father   . Coronary artery disease Mother   . Heart attack Sister   . Deep vein thrombosis Sister    Social History   Socioeconomic History  . Marital status: Married    Spouse name: None  . Number of children: None  . Years of education: None  . Highest education level: None  Social Needs  . Financial resource strain: Not hard at all  . Food insecurity - worry: Never true  . Food insecurity - inability: Never true  . Transportation needs - medical: No  . Transportation needs - non-medical: No  Occupational History  . Occupation: retired    Comment: machinest  Tobacco Use  . Smoking status: Never Smoker  . Smokeless tobacco: Never Used  Substance and Sexual Activity  . Alcohol use: No    Alcohol/week: 0.0 oz  . Drug use: No  . Sexual activity: None  Other Topics Concern  . None  Social History Narrative   ** Merged History Encounter **        Outpatient Encounter Medications as of 05/01/2017  Medication Sig  . carbidopa-levodopa (SINEMET IR) 25-100 MG tablet 2 at 6 am, 2 at 10 am, 2 at 2 pm, 1 at 6 pm  . cetirizine (ZYRTEC) 10 MG tablet Take 10 mg by mouth daily.  Marland Kitchen lisinopril (PRINIVIL,ZESTRIL) 30 MG tablet TAKE 1 TABLET EVERY DAY  . Melatonin 2.5 MG CAPS Take 1 capsule by mouth at bedtime as needed (for sleep).   . metoprolol succinate (TOPROL-XL) 25 MG 24 hr tablet Take 1 tablet (25 mg total) by mouth daily.  Marland Kitchen rOPINIRole (REQUIP) 2 MG tablet TAKE 1 TABLET THREE TIMES DAILY  . sildenafil (REVATIO) 20 MG tablet Take 1 tablet (20 mg total) by mouth 3 (three) times daily as needed.  . simvastatin (ZOCOR) 40 MG tablet Take 1  tablet (40 mg total) by mouth daily.  Alveda Reasons 20 MG TABS tablet TAKE 1 TABLET (20 MG TOTAL) BY MOUTH DAILY.  . [DISCONTINUED] bleomycin (BLEOCIN) 15 units injection For use in physician office  . [DISCONTINUED] Propylene Glycol (SYSTANE BALANCE OP) Apply to eye.  . [DISCONTINUED] traMADol (ULTRAM) 50 MG tablet 50 mg every 6 (six) hours as needed.    No facility-administered encounter medications on file as of 05/01/2017.     Activities of Daily Living In your present state of health, do you have any difficulty performing the following activities: 05/01/2017  Hearing? N  Vision? N  Difficulty concentrating or making decisions? Y  Walking or climbing stairs? N  Dressing or bathing? N  Doing errands, shopping? N  Preparing Food and eating ? N  Using the Toilet? N  In the past six months, have you accidently leaked urine? N  Do you have problems with loss of bowel control? N  Managing your Medications? N  Managing your Finances? N  Housekeeping or managing your Housekeeping? N  Some recent data might be hidden    Patient Care Team: [name], DO as PCP - General (Family Medicine) Tat, Eustace Quail, DO as Consulting Physician (Neurology) Odette Fraction [facility]) Minna Merritts, MD as Consulting Physician (Cardiology)   Assessment:   This is a routine wellness examination for Thomas [name].  Exercise Activities and Dietary recommendations Current Exercise Habits: Home exercise routine;Structured exercise class(PT ), Time (Minutes): > 60, Frequency (Times/Week): 3, Weekly Exercise (Minutes/Week): 0, Intensity: Mild  Goals    . DIET - INCREASE WATER INTAKE     Recommend drinking at least 6-8 glasses of water a day        Fall Risk Fall Risk  03/27/2017 11/25/2016 07/04/2016 03/28/2016 03/27/2016  Falls in the past year? No No No No No   Is the patient's home free of loose throw rugs in walkways, pet beds, electrical cords, etc?   yes      Grab bars in the bathroom?  yes      Handrails on the stairs?   yes      Adequate lighting?   yes  Timed Get Up and Go Performed: completed in 9 seconds with no use of assistive devices. Steady gait. No intervention needed at this time  Depression Screen PHQ 2/9 Scores 03/28/2016 08/23/2015  PHQ - 2 Score 0 0  PHQ- 9 Score 3 -    Cognitive Function MMSE - Mini Mental State Exam 03/27/2017  Not completed: Unable to complete     6CIT Screen 05/01/2017 03/28/2016  What Year? 0 points 0 points  What month? 0 points 0 points  What time? 0 points 0 points  Count back from 20 0 points 0 points  Months in reverse 0 points 0 points  Repeat phrase 0 points 2 points  Total Score 0 2    Immunization History  Administered Date(s) Administered  . Influenza, High Dose Seasonal PF 02/13/2017  . Influenza,inj,Quad PF,6+ Mos 02/05/2015  . Influenza-Unspecified 02/05/2015, 01/27/2016  . Pneumococcal Polysaccharide-23 05/01/2017  . Tdap 02/16/2014  . Zoster 12/16/2014   Patient states Prevnar 13 done in March/April of 2017. Checked [facility] and [facility] with no records found on this vaccine. Patient will look at pharmacy records.   Qualifies for Shingles Vaccine? Yes, has had zostavax- discussed shingrix vaccine  Screening Tests Health Maintenance  Topic Date Due  . COLONOSCOPY  06/18/1999  . PNA vac Low Risk Adult (2 of 2 - PPSV23) 05/01/2018  . TETANUS/TDAP  02/17/2024  . INFLUENZA VACCINE  Completed  . Hepatitis C Screening  Completed   Cancer Screenings: Lung: Low Dose CT Chest recommended if Age 73-80 years, 30 pack-year currently smoking OR have quit w/in 15years. Patient does not qualify. Colorectal: due  Additional Screenings:  Hepatitis B/HIV/Syphillis: not indicated Hepatitis C Screening: completed 08/23/2015    Plan:    I have personally reviewed and addressed the Medicare Annual Wellness questionnaire and have noted the following in the patient's chart:  A. Medical and social history B. Use of  alcohol, tobacco or illicit drugs  C. Current medications and supplements D. Functional ability and status E.  Nutritional status F.  Physical activity G. Advance directives H. List of other physicians I.  Hospitalizations, surgeries, and ER visits in previous 12 months J.  [place] such as hearing and vision if needed, cognitive and depression L. Referrals and appointments   In addition, I have reviewed and discussed with patient certain preventive protocols, quality metrics, and best practice recommendations. A written personalized care plan for preventive services as well as general preventive health recommendations were provided to patient.   Signed,  Tyler Aas, LPN Nurse Health Advisor   Nurse Notes:none

## 2017-05-01 NOTE — Patient Instructions (Addendum)
[name] , Thank you for taking time to come for your Medicare Wellness Visit. I appreciate your ongoing commitment to your health goals. Please review the following plan we discussed and let me know if I can assist you in the future.   Screening recommendations/referrals: Colonoscopy: due Recommended yearly ophthalmology/optometry visit for glaucoma screening and checkup Recommended yearly dental visit for hygiene and checkup  Vaccinations: Influenza vaccine: up to date Pneumococcal vaccine: pneumovax 23 done today Tdap vaccine: up to date Shingles vaccine: up to date, if you do wish to get shingrix vaccine- call your insurance for coverage information    Advanced directives: Please bring a copy of your health care power of attorney and living will to the office at your convenience.  Conditions/risks identified: Recommend drinking at least 6-8 glasses of water a day   Next appointment:Follow up on 05/04/2017 at 1:00pm with Dr.Johnson. Follow up in one year for your annual wellness exam.   Preventive Care 65 Years and Older, Male Preventive care refers to lifestyle choices and visits with your health care provider that can promote health and wellness. What does preventive care include?  A yearly physical exam. This is also called an annual well check.  Dental exams once or twice a year.  Routine eye exams. Ask your health care provider how often you should have your eyes checked.  Personal lifestyle choices, including:  Daily care of your teeth and gums.  Regular physical activity.  Eating a healthy diet.  Avoiding tobacco and drug use.  Limiting alcohol use.  Practicing safe sex.  Taking low doses of aspirin every day.  Taking vitamin and mineral supplements as recommended by your health care provider. What happens during an annual well check? The services and screenings done by your health care provider during your annual well check will depend on your age, overall  health, lifestyle risk factors, and family history of disease. Counseling  Your health care provider may ask you questions about your:  Alcohol use.  Tobacco use.  Drug use.  Emotional well-being.  Home and relationship well-being.  Sexual activity.  Eating habits.  History of falls.  Memory and ability to understand (cognition).  Work and work Statistician. Screening  You may have the following tests or measurements:  Height, weight, and BMI.  Blood pressure.  Lipid and cholesterol levels. These may be checked every 5 years, or more frequently if you are over 22 years old.  Skin check.  Lung cancer screening. You may have this screening every year starting at age 30 if you have a 30-pack-year history of smoking and currently smoke or have quit within the past 15 years.  Fecal occult blood test (FOBT) of the stool. You may have this test every year starting at age 64.  Flexible sigmoidoscopy or colonoscopy. You may have a sigmoidoscopy every 5 years or a colonoscopy every 10 years starting at age 14.  Prostate cancer screening. Recommendations will vary depending on your family history and other risks.  Hepatitis C blood test.  Hepatitis B blood test.  Sexually transmitted disease (STD) testing.  Diabetes screening. This is done by checking your blood sugar (glucose) after you have not eaten for a while (fasting). You may have this done every 1-3 years.  Abdominal aortic aneurysm (AAA) screening. You may need this if you are a current or former smoker.  Osteoporosis. You may be screened starting at age 73 if you are at high risk. Talk with your health care provider about  your test results, treatment options, and if necessary, the need for more tests. Vaccines  Your health care provider may recommend certain vaccines, such as:  Influenza vaccine. This is recommended every year.  Tetanus, diphtheria, and acellular pertussis (Tdap, Td) vaccine. You may need a Td  booster every 10 years.  Zoster vaccine. You may need this after age 24.  Pneumococcal 13-valent conjugate (PCV13) vaccine. One dose is recommended after age 72.  Pneumococcal polysaccharide (PPSV23) vaccine. One dose is recommended after age 14. Talk to your health care provider about which screenings and vaccines you need and how often you need them. This information is not intended to replace advice given to you by your health care provider. Make sure you discuss any questions you have with your health care provider. Document Released: 04/27/2015 Document Revised: 12/19/2015 Document Reviewed: 01/30/2015 Elsevier Interactive Patient Education  2017 [place] Prevention in the Home Falls can cause injuries. They can happen to people of all ages. There are many things you can do to make your home safe and to help prevent falls. What can I do on the outside of my home?  Regularly fix the edges of walkways and driveways and fix any cracks.  Remove anything that might make you trip as you walk through a door, such as a raised step or threshold.  Trim any bushes or trees on the path to your home.  Use bright outdoor lighting.  Clear any walking paths of anything that might make someone trip, such as rocks or tools.  Regularly check to see if handrails are loose or broken. Make sure that both sides of any steps have handrails.  Any raised decks and porches should have guardrails on the edges.  Have any leaves, snow, or ice cleared regularly.  Use sand or salt on walking paths during winter.  Clean up any spills in your garage right away. This includes oil or grease spills. What can I do in the bathroom?  Use night lights.  Install grab bars by the toilet and in the tub and shower. Do not use towel bars as grab bars.  Use non-skid mats or decals in the tub or shower.  If you need to sit down in the shower, use a plastic, non-slip stool.  Keep the floor dry. Clean up  any water that spills on the floor as soon as it happens.  Remove soap buildup in the tub or shower regularly.  Attach bath mats securely with double-sided non-slip rug tape.  Do not have throw rugs and other things on the floor that can make you trip. What can I do in the bedroom?  Use night lights.  Make sure that you have a light by your bed that is easy to reach.  Do not use any sheets or blankets that are too big for your bed. They should not hang down onto the floor.  Have a firm chair that has side arms. You can use this for support while you get dressed.  Do not have throw rugs and other things on the floor that can make you trip. What can I do in the kitchen?  Clean up any spills right away.  Avoid walking on wet floors.  Keep items that you use a lot in easy-to-reach places.  If you need to reach something above you, use a strong step stool that has a grab bar.  Keep electrical cords out of the way.  Do not use floor polish or wax  that makes floors slippery. If you must use wax, use non-skid floor wax.  Do not have throw rugs and other things on the floor that can make you trip. What can I do with my stairs?  Do not leave any items on the stairs.  Make sure that there are handrails on both sides of the stairs and use them. Fix handrails that are broken or loose. Make sure that handrails are as long as the stairways.  Check any carpeting to make sure that it is firmly attached to the stairs. Fix any carpet that is loose or worn.  Avoid having throw rugs at the top or bottom of the stairs. If you do have throw rugs, attach them to the floor with carpet tape.  Make sure that you have a light switch at the top of the stairs and the bottom of the stairs. If you do not have them, ask someone to add them for you. What else can I do to help prevent falls?  Wear shoes that:  Do not have high heels.  Have rubber bottoms.  Are comfortable and fit you well.  Are  closed at the toe. Do not wear sandals.  If you use a stepladder:  Make sure that it is fully opened. Do not climb a closed stepladder.  Make sure that both sides of the stepladder are locked into place.  Ask someone to hold it for you, if possible.  Clearly mark and make sure that you can see:  Any grab bars or handrails.  First and last steps.  Where the edge of each step is.  Use tools that help you move around (mobility aids) if they are needed. These include:  Canes.  Walkers.  Scooters.  Crutches.  Turn on the lights when you go into a dark area. Replace any light bulbs as soon as they burn out.  Set up your furniture so you have a clear path. Avoid moving your furniture around.  If any of your floors are uneven, fix them.  If there are any pets around you, be aware of where they are.  Review your medicines with your doctor. Some medicines can make you feel dizzy. This can increase your chance of falling. Ask your doctor what other things that you can do to help prevent falls. This information is not intended to replace advice given to you by your health care provider. Make sure you discuss any questions you have with your health care provider. Document Released: 01/25/2009 Document Revised: 09/06/2015 Document Reviewed: 05/05/2014 Elsevier Interactive Patient Education  2017 [employer].

## 2017-05-03 ENCOUNTER — Encounter: Payer: Self-pay | Admitting: Hematology and Oncology

## 2017-05-04 ENCOUNTER — Ambulatory Visit (INDEPENDENT_AMBULATORY_CARE_PROVIDER_SITE_OTHER): Payer: Medicare HMO | Admitting: Family Medicine

## 2017-05-04 ENCOUNTER — Encounter: Payer: Self-pay | Admitting: Family Medicine

## 2017-05-04 ENCOUNTER — Ambulatory Visit: Payer: Medicare HMO

## 2017-05-04 VITALS — BP 131/80 | HR 54 | Temp 97.4°F | Ht 70.0 in | Wt 249.2 lb

## 2017-05-04 DIAGNOSIS — N521 Erectile dysfunction due to diseases classified elsewhere: Secondary | ICD-10-CM | POA: Diagnosis not present

## 2017-05-04 DIAGNOSIS — Z125 Encounter for screening for malignant neoplasm of prostate: Secondary | ICD-10-CM | POA: Diagnosis not present

## 2017-05-04 DIAGNOSIS — I480 Paroxysmal atrial fibrillation: Secondary | ICD-10-CM | POA: Diagnosis not present

## 2017-05-04 DIAGNOSIS — I2782 Chronic pulmonary embolism: Secondary | ICD-10-CM | POA: Diagnosis not present

## 2017-05-04 DIAGNOSIS — Z0000 Encounter for general adult medical examination without abnormal findings: Secondary | ICD-10-CM

## 2017-05-04 DIAGNOSIS — I82502 Chronic embolism and thrombosis of unspecified deep veins of left lower extremity: Secondary | ICD-10-CM

## 2017-05-04 DIAGNOSIS — G20 Parkinson's disease: Secondary | ICD-10-CM | POA: Diagnosis not present

## 2017-05-04 DIAGNOSIS — E7800 Pure hypercholesterolemia, unspecified: Secondary | ICD-10-CM | POA: Diagnosis not present

## 2017-05-04 DIAGNOSIS — I10 Essential (primary) hypertension: Secondary | ICD-10-CM | POA: Diagnosis not present

## 2017-05-04 LAB — UA/M W/RFLX CULTURE, ROUTINE
Bilirubin, UA: NEGATIVE
GLUCOSE, UA: NEGATIVE
Leukocytes, UA: NEGATIVE
Nitrite, UA: NEGATIVE
PROTEIN UA: NEGATIVE
RBC UA: NEGATIVE
SPEC GRAV UA: 1.025 (ref 1.005–1.030)
UUROB: 1 mg/dL (ref 0.2–1.0)
pH, UA: 5.5 (ref 5.0–7.5)

## 2017-05-04 LAB — MICROALBUMIN, URINE WAIVED
Creatinine, Urine Waived: 100 mg/dL (ref 10–300)
MICROALB, UR WAIVED: 30 mg/L — AB (ref 0–19)

## 2017-05-04 MED ORDER — RIVAROXABAN 20 MG PO TABS: 20.0000 mg | ORAL_TABLET | Freq: Every day | ORAL | 3 refills | Status: DC

## 2017-05-04 MED ORDER — SIMVASTATIN 40 MG PO TABS: 40.0000 mg | ORAL_TABLET | Freq: Every day | ORAL | 1 refills | Status: DC

## 2017-05-04 MED ORDER — LISINOPRIL 30 MG PO TABS: 30.0000 mg | ORAL_TABLET | Freq: Every day | ORAL | 1 refills | Status: DC

## 2017-05-04 MED ORDER — SILDENAFIL CITRATE 20 MG PO TABS: 20.0000 mg | ORAL_TABLET | Freq: Three times a day (TID) | ORAL | 6 refills | Status: DC | PRN

## 2017-05-04 NOTE — Assessment & Plan Note (Signed)
Under good control. Continue current regimen. Continue to monitor. Call with any concerns. 

## 2017-05-04 NOTE — Progress Notes (Signed)
BP 131/80 (BP Location: Left Arm, Patient Position: Sitting, Cuff Size: Large)   Pulse (!) 54   Temp (!) 97.4 F (36.3 C)   Ht 5\' 10"  (1.778 m)   Wt 249 lb 4 oz (113.1 kg)   SpO2 99%   BMI 35.76 kg/m    Subjective:    Patient ID: Thomas [name], male    DOB: [birth year], 67 y.o.   MRN: [id number]  HPI: Thomas [name] is a 67 y.o. male presenting on 05/04/2017 for comprehensive medical examination. Current medical complaints include:  HYPERTENSION / HYPERLIPIDEMIA Satisfied with current treatment? yes Duration of hypertension: chronic BP monitoring frequency: not checking BP medication side effects: no Past BP meds: metoprolol, lisinopril Duration of hyperlipidemia: chronic Cholesterol medication side effects: no Cholesterol supplements: none Past cholesterol medications: simvastatin Medication compliance: excellent compliance Aspirin: no Recent stressors: no Recurrent headaches: no Visual changes: no Palpitations: no Dyspnea: no Chest pain: no Lower extremity edema: no Dizzy/lightheaded: no  He currently lives with: wife Interim Problems from his last visit: no  Depression Screen done today and results listed below:  Depression screen [facility] 2/9 05/04/2017 03/28/2016 08/23/2015  Decreased Interest 1 0 0  Down, Depressed, Hopeless 1 0 0  PHQ - 2 Score 2 0 0  Altered sleeping 0 1 -  Tired, decreased energy 1 0 -  Change in appetite 0 0 -  Feeling bad or failure about yourself  0 1 -  Trouble concentrating 1 0 -  Moving slowly or fidgety/restless 0 1 -  Suicidal thoughts 0 0 -  PHQ-9 Score 4 3 -  Difficult doing work/chores Not difficult at all - -    Past Medical History:  Past Medical History:  Diagnosis Date  . Allergy   . Arthritis    Osteoarthritis  . Arthritis of knee   . Atrial fibrillation ([place])   . DVT (deep venous thrombosis) (HCC)    L leg  . Hyperchloremia   . Hypercholesterolemia   . Hypertension   . Nephrolithiasis   . Parkinson's disease  ([place])   . Plantar wart   . Pulmonary embolism ([place])   . Seasonal allergies   . TIA (transient ischemic attack)     Surgical History:  Past Surgical History:  Procedure Laterality Date  . APPENDECTOMY    . KNEE ARTHROSCOPY WITH MENISCAL REPAIR Right 2009  . KNEE SURGERY Right   . T&A    . TONSILLECTOMY      Medications:  Current Outpatient Medications on File Prior to Visit  Medication Sig  . carbidopa-levodopa (SINEMET IR) 25-100 MG tablet 2 at 6 am, 2 at 10 am, 2 at 2 pm, 1 at 6 pm  . cetirizine (ZYRTEC) 10 MG tablet Take 10 mg by mouth daily.  . Melatonin 2.5 MG CAPS Take 1 capsule by mouth at bedtime as needed (for sleep).   . metoprolol succinate (TOPROL-XL) 25 MG 24 hr tablet Take 1 tablet (25 mg total) by mouth daily.  Marland Kitchen rOPINIRole (REQUIP) 2 MG tablet TAKE 1 TABLET THREE TIMES DAILY   No current facility-administered medications on file prior to visit.     Allergies:  Allergies  Allergen Reactions  . Codeine Other (See Comments)    Reaction: Hallucinations    Social History:  Social History   Socioeconomic History  . Marital status: Married    Spouse name: Not on file  . Number of children: Not on file  . Years of education: Not on file  . Highest  education level: Not on file  Social Needs  . Financial resource strain: Not hard at all  . Food insecurity - worry: Never true  . Food insecurity - inability: Never true  . Transportation needs - medical: No  . Transportation needs - non-medical: No  Occupational History  . Occupation: retired    Comment: machinest  Tobacco Use  . Smoking status: Never Smoker  . Smokeless tobacco: Never Used  Substance and Sexual Activity  . Alcohol use: No    Alcohol/week: 0.0 oz  . Drug use: No  . Sexual activity: Not on file  Other Topics Concern  . Not on file  Social History Narrative   ** Merged History Encounter **       Social History   Tobacco Use  Smoking Status Never Smoker  Smokeless Tobacco Never  Used   Social History   Substance and Sexual Activity  Alcohol Use No  . Alcohol/week: 0.0 oz    Family History:  Family History  Problem Relation Age of Onset  . Parkinson's disease Father   . Coronary artery disease Mother   . Heart attack Sister   . Deep vein thrombosis Sister     Past medical history, surgical history, medications, allergies, family history and social history reviewed with patient today and changes made to appropriate areas of the chart.   Review of Systems  Constitutional: Negative.   HENT: Negative.   Eyes: Negative.        Dry eye   Respiratory: Positive for cough. Negative for hemoptysis, sputum production, shortness of breath and wheezing.   Cardiovascular: Negative.   Gastrointestinal: Positive for constipation and heartburn. Negative for abdominal pain, blood in stool, diarrhea, melena, nausea and vomiting.  Genitourinary: Negative.   Musculoskeletal: Positive for joint pain (shoulder pain) and neck pain. Negative for back pain, falls and myalgias.  Skin: Negative.   Neurological: Positive for tremors. Negative for dizziness, tingling, sensory change, speech change, focal weakness, seizures, loss of consciousness and headaches.  Endo/Heme/Allergies: Positive for environmental allergies. Negative for polydipsia. Does not bruise/bleed easily.  Psychiatric/Behavioral: Negative.     All other ROS negative except what is listed above and in the HPI.      Objective:    BP 131/80 (BP Location: Left Arm, Patient Position: Sitting, Cuff Size: Large)   Pulse (!) 54   Temp (!) 97.4 F (36.3 C)   Ht 5\' 10"  (1.778 m)   Wt 249 lb 4 oz (113.1 kg)   SpO2 99%   BMI 35.76 kg/m   Wt Readings from Last 3 Encounters:  05/04/17 249 lb 4 oz (113.1 kg)  05/01/17 247 lb 1.6 oz (112.1 kg)  04/27/17 246 lb (111.6 kg)    Physical Exam  Constitutional: He is oriented to person, place, and time. He appears well-developed and well-nourished. No distress.  HENT:    Head: Normocephalic and atraumatic.  Right Ear: Hearing, tympanic membrane, external ear and ear canal normal.  Left Ear: Hearing, tympanic membrane, external ear and ear canal normal.  Nose: Nose normal.  Mouth/Throat: Uvula is midline, oropharynx is clear and moist and mucous membranes are normal. No oropharyngeal exudate.  Eyes: Conjunctivae, EOM and lids are normal. Pupils are equal, round, and reactive to light. Right eye exhibits no discharge. Left eye exhibits no discharge. No scleral icterus.  Neck: Normal range of motion. Neck supple. No JVD present. No tracheal deviation present. No thyromegaly present.  Cardiovascular: Normal rate, regular rhythm, normal heart sounds and  intact distal pulses. Exam reveals no gallop and no friction rub.  No murmur heard. Pulmonary/Chest: Effort normal and breath sounds normal. No stridor. No respiratory distress. He has no wheezes. He has no rales. He exhibits no tenderness.  Abdominal: Soft. Bowel sounds are normal. He exhibits no distension and no mass. There is no tenderness. There is no rebound and no guarding.  Genitourinary:  Genitourinary Comments: Penis and prostate exam deferred with shared decision making.   Musculoskeletal: Normal range of motion. He exhibits no edema, tenderness or deformity.  Lymphadenopathy:    He has no cervical adenopathy.  Neurological: He is alert and oriented to person, place, and time. He has normal reflexes. He displays normal reflexes. No cranial nerve deficit. He exhibits abnormal muscle tone. Coordination normal.  Skin: Skin is warm, dry and intact. No rash noted. He is not diaphoretic. No erythema. No pallor.  Psychiatric: He has a normal mood and affect. His speech is normal and behavior is normal. Judgment and thought content normal. Cognition and memory are normal.  Nursing note and vitals reviewed.   Results for orders placed or performed in visit on 09/26/16  Comprehensive metabolic panel  Result  Value Ref Range   Glucose 101 (H) 65 - 99 mg/dL   BUN 17 8 - 27 mg/dL   Creatinine, Ser 1.05 0.76 - 1.27 mg/dL   GFR calc non Af Amer 73 >59 mL/min/1.73   GFR calc Af Amer 84 >59 mL/min/1.73   BUN/Creatinine Ratio 16 10 - 24   Sodium 141 134 - 144 mmol/L   Potassium 4.6 3.5 - 5.2 mmol/L   Chloride 104 96 - 106 mmol/L   CO2 24 20 - 29 mmol/L   Calcium 9.5 8.6 - 10.2 mg/dL   Total Protein 6.9 6.0 - 8.5 g/dL   Albumin 4.3 3.6 - 4.8 g/dL   Globulin, Total 2.6 1.5 - 4.5 g/dL   Albumin/Globulin Ratio 1.7 1.2 - 2.2   Bilirubin Total 0.5 0.0 - 1.2 mg/dL   Alkaline Phosphatase 69 39 - 117 IU/L   AST 16 0 - 40 IU/L   ALT 12 0 - 44 IU/L  Lipid Panel w/o Chol/HDL Ratio  Result Value Ref Range   Cholesterol, Total 127 100 - 199 mg/dL   Triglycerides 92 0 - 149 mg/dL   HDL 45 >39 mg/dL   VLDL Cholesterol Cal 18 5 - 40 mg/dL   LDL Calculated 64 0 - 99 mg/dL  CBC with Differential/Platelet  Result Value Ref Range   WBC 5.5 3.4 - 10.8 x10E3/uL   RBC 4.86 4.14 - 5.80 x10E6/uL   Hemoglobin 14.7 13.0 - 17.7 g/dL   Hematocrit 43.8 37.5 - 51.0 %   MCV 90 79 - 97 fL   MCH 30.2 26.6 - 33.0 pg   MCHC 33.6 31.5 - 35.7 g/dL   RDW 13.3 12.3 - 15.4 %   Platelets 166 150 - 379 x10E3/uL   Neutrophils 70 Not Estab. %   Lymphs 20 Not Estab. %   Monocytes 7 Not Estab. %   Eos 2 Not Estab. %   Basos 1 Not Estab. %   Neutrophils Absolute 3.9 1.4 - 7.0 x10E3/uL   Lymphocytes Absolute 1.1 0.7 - 3.1 x10E3/uL   Monocytes Absolute 0.4 0.1 - 0.9 x10E3/uL   EOS (ABSOLUTE) 0.1 0.0 - 0.4 x10E3/uL   Basophils Absolute 0.0 0.0 - 0.2 x10E3/uL   Immature Granulocytes 0 Not Estab. %   Immature Grans (Abs) 0.0 0.0 - 0.1 x10E3/uL  Assessment & Plan:   Problem List Items Addressed This Visit      Cardiovascular and Mediastinum   Hypertension    Under good control. Continue current regimen. Continue to monitor. Call with any concerns.       Relevant Medications   simvastatin (ZOCOR) 40 MG tablet    sildenafil (REVATIO) 20 MG tablet   rivaroxaban (XARELTO) 20 MG TABS tablet   lisinopril (PRINIVIL,ZESTRIL) 30 MG tablet   Other Relevant Orders   Comprehensive metabolic panel   CBC with Differential/Platelet   Microalbumin, Urine Waived   TSH   UA/M w/rflx Culture, Routine   DVT (deep venous thrombosis) (HCC)    Resolved. Continue xarelto. HR under good control. Call with any concerns.       Relevant Medications   simvastatin (ZOCOR) 40 MG tablet   sildenafil (REVATIO) 20 MG tablet   rivaroxaban (XARELTO) 20 MG TABS tablet   lisinopril (PRINIVIL,ZESTRIL) 30 MG tablet   Other Relevant Orders   Comprehensive metabolic panel   CBC with Differential/Platelet   TSH   UA/M w/rflx Culture, Routine   Pulmonary embolism (HCC)    Resolved. Continue xarelto. HR under good control. Call with any concerns.       Relevant Medications   simvastatin (ZOCOR) 40 MG tablet   sildenafil (REVATIO) 20 MG tablet   rivaroxaban (XARELTO) 20 MG TABS tablet   lisinopril (PRINIVIL,ZESTRIL) 30 MG tablet   Other Relevant Orders   Comprehensive metabolic panel   CBC with Differential/Platelet   TSH   UA/M w/rflx Culture, Routine   Atrial fibrillation (HCC)    Under good control. Continue xarelto. HR under good control. Call with any concerns.       Relevant Medications   simvastatin (ZOCOR) 40 MG tablet   sildenafil (REVATIO) 20 MG tablet   rivaroxaban (XARELTO) 20 MG TABS tablet   lisinopril (PRINIVIL,ZESTRIL) 30 MG tablet   Other Relevant Orders   Comprehensive metabolic panel   CBC with Differential/Platelet   TSH   UA/M w/rflx Culture, Routine     Nervous and Auditory   Parkinson's disease ([place])    Stable- slowly progressing. Continue to follow with neurology. Continue exercise. Call with any concerns. If mood getting worse, let us know.       Relevant Orders   Comprehensive metabolic panel   CBC with Differential/Platelet   TSH   UA/M w/rflx Culture, Routine     Genitourinary    Erectile dysfunction    Stable. Continue current regimen. Continue to monitor.         Other   Hypercholesterolemia    Under good control. Continue current regimen. Continue to monitor. Call with any concerns.       Relevant Medications   simvastatin (ZOCOR) 40 MG tablet   sildenafil (REVATIO) 20 MG tablet   rivaroxaban (XARELTO) 20 MG TABS tablet   lisinopril (PRINIVIL,ZESTRIL) 30 MG tablet   Other Relevant Orders   Comprehensive metabolic panel   CBC with Differential/Platelet   Lipid Panel w/o Chol/HDL Ratio   TSH   UA/M w/rflx Culture, Routine    Other Visit Diagnoses    Routine general medical examination at a health care facility    -  Primary   Vaccines up to date. Screening labs checked today. Cologuard up to date. Continue diet and exercise. Call with any concerns.    Screening for prostate cancer       Labs drawn today. Await results.    Relevant Orders   PSA  Discussed aspirin prophylaxis for myocardial infarction prevention and decision was made to continue ASA  LABORATORY TESTING:  Health maintenance labs ordered today as discussed above.   The natural history of prostate cancer and ongoing controversy regarding screening and potential treatment outcomes of prostate cancer has been discussed with the patient. The meaning of a false positive PSA and a false negative PSA has been discussed. He indicates understanding of the limitations of this screening test and wishes to proceed with screening PSA testing.   IMMUNIZATIONS:   - Tdap: Tetanus vaccination status reviewed: last tetanus booster within 10 years. - Influenza: Up to date - Pneumovax: Up to date - Prevnar: Up to date  SCREENING: - Colonoscopy: Up to date  Discussed with patient purpose of the colonoscopy is to detect colon cancer at curable precancerous or early stages   PATIENT COUNSELING:    Sexuality: Discussed sexually transmitted diseases, partner selection, use of condoms,  avoidance of unintended pregnancy  and contraceptive alternatives.   Advised to avoid cigarette smoking.  I discussed with the patient that most people either abstain from alcohol or drink within safe limits (<=14/week and <=4 drinks/occasion for males, <=7/weeks and <= 3 drinks/occasion for females) and that the risk for alcohol disorders and other health effects rises proportionally with the number of drinks per week and how often a drinker exceeds daily limits.  Discussed cessation/primary prevention of drug use and availability of treatment for abuse.   Diet: Encouraged to adjust caloric intake to maintain  or achieve ideal body weight, to reduce intake of dietary saturated fat and total fat, to limit sodium intake by avoiding high sodium foods and not adding table salt, and to maintain adequate dietary potassium and calcium preferably from fresh fruits, vegetables, and low-fat dairy products.    stressed the importance of regular exercise  Injury prevention: Discussed safety belts, safety helmets, smoke detector, smoking near bedding or upholstery.   Dental health: Discussed importance of regular tooth brushing, flossing, and dental visits.   Follow up plan: NEXT PREVENTATIVE PHYSICAL DUE IN 1 YEAR. Return in about 6 months (around 11/01/2017) for Follow up.

## 2017-05-04 NOTE — Assessment & Plan Note (Signed)
Stable. Continue current regimen. Continue to monitor.  

## 2017-05-04 NOTE — Patient Instructions (Addendum)

## 2017-05-04 NOTE — Assessment & Plan Note (Signed)
Stable- slowly progressing. Continue to follow with neurology. Continue exercise. Call with any concerns. If mood getting worse, let us know.

## 2017-05-04 NOTE — Assessment & Plan Note (Signed)
Under good control. Continue xarelto. HR under good control. Call with any concerns.

## 2017-05-04 NOTE — Assessment & Plan Note (Signed)
Resolved. Continue xarelto. HR under good control. Call with any concerns.

## 2017-05-05 LAB — CBC WITH DIFFERENTIAL/PLATELET
BASOS: 1 %
Basophils Absolute: 0.1 10*3/uL (ref 0.0–0.2)
EOS (ABSOLUTE): 0.1 10*3/uL (ref 0.0–0.4)
Eos: 1 %
Hematocrit: 44.2 % (ref 37.5–51.0)
Hemoglobin: 14.4 g/dL (ref 13.0–17.7)
IMMATURE GRANS (ABS): 0 10*3/uL (ref 0.0–0.1)
IMMATURE GRANULOCYTES: 0 %
Lymphocytes Absolute: 1.3 10*3/uL (ref 0.7–3.1)
Lymphs: 18 %
MCH: 29.8 pg (ref 26.6–33.0)
MCHC: 32.6 g/dL (ref 31.5–35.7)
MCV: 91 fL (ref 79–97)
MONOS ABS: 0.5 10*3/uL (ref 0.1–0.9)
Monocytes: 8 %
NEUTROS ABS: 5.1 10*3/uL (ref 1.4–7.0)
NEUTROS PCT: 72 %
Platelets: 180 10*3/uL (ref 150–379)
RBC: 4.84 x10E6/uL (ref 4.14–5.80)
RDW: 13.1 % (ref 12.3–15.4)
WBC: 7 10*3/uL (ref 3.4–10.8)

## 2017-05-05 LAB — COMPREHENSIVE METABOLIC PANEL
ALBUMIN: 4.3 g/dL (ref 3.6–4.8)
ALT: 7 IU/L (ref 0–44)
AST: 9 IU/L (ref 0–40)
Albumin/Globulin Ratio: 1.6 (ref 1.2–2.2)
Alkaline Phosphatase: 85 IU/L (ref 39–117)
BUN/Creatinine Ratio: 17 (ref 10–24)
BUN: 16 mg/dL (ref 8–27)
Bilirubin Total: 0.4 mg/dL (ref 0.0–1.2)
CALCIUM: 9 mg/dL (ref 8.6–10.2)
CO2: 25 mmol/L (ref 20–29)
CREATININE: 0.92 mg/dL (ref 0.76–1.27)
Chloride: 107 mmol/L — ABNORMAL HIGH (ref 96–106)
GFR, EST AFRICAN AMERICAN: 99 mL/min/{1.73_m2} (ref >59)
GFR, EST NON AFRICAN AMERICAN: 86 mL/min/{1.73_m2} (ref >59)
GLOBULIN, TOTAL: 2.7 g/dL (ref 1.5–4.5)
Glucose: 94 mg/dL (ref 65–99)
POTASSIUM: 4.5 mmol/L (ref 3.5–5.2)
SODIUM: 144 mmol/L (ref 134–144)
TOTAL PROTEIN: 7 g/dL (ref 6.0–8.5)

## 2017-05-05 LAB — PSA: PROSTATE SPECIFIC AG, SERUM: 4.6 ng/mL — AB (ref 0.0–4.0)

## 2017-05-05 LAB — LIPID PANEL W/O CHOL/HDL RATIO
Cholesterol, Total: 126 mg/dL (ref 100–199)
HDL: 42 mg/dL (ref >39)
LDL Calculated: 69 mg/dL (ref 0–99)
Triglycerides: 77 mg/dL (ref 0–149)
VLDL Cholesterol Cal: 15 mg/dL (ref 5–40)

## 2017-05-05 LAB — TSH: TSH: 1.12 u[IU]/mL (ref 0.450–4.500)

## 2017-05-06 ENCOUNTER — Encounter: Payer: Self-pay | Admitting: Family Medicine

## 2017-05-13 ENCOUNTER — Encounter: Payer: Self-pay | Admitting: Hematology and Oncology

## 2017-05-14 ENCOUNTER — Telehealth: Payer: Self-pay | Admitting: Family Medicine

## 2017-05-14 NOTE — Telephone Encounter (Signed)
Dr.Johnson, can you please call patient's wife

## 2017-05-14 NOTE — Telephone Encounter (Signed)
Copied from [place] [id number]. Topic: Quick Communication - Lab Results >> May 14, 2017 10:49 AM Robina Ade, Helene Kelp D wrote: Patient wife called and would like to talk to [facility] or Dr. [name] about his lab results. There are reading that are abnormal that she has questions about. Please call her back, thanks.

## 2017-05-14 NOTE — Telephone Encounter (Signed)
Called and discussed results with wife. Nothing to worry about.

## 2017-07-01 ENCOUNTER — Other Ambulatory Visit: Payer: Self-pay | Admitting: Neurology

## 2017-07-16 ENCOUNTER — Other Ambulatory Visit: Payer: Self-pay | Admitting: Neurology

## 2017-07-23 ENCOUNTER — Other Ambulatory Visit: Payer: Self-pay | Admitting: Cardiovascular Disease

## 2017-07-23 NOTE — Progress Notes (Signed)
Thomas [name] was seen today in the movement disorders clinic for neurologic consultation at the request of [name], DO.  The consultation is for the evaluation of PD.  He is accompanied by his wife who supplements the history.  The patient has seen [name] at [facility] and I do have those notes.  The patient was dx with PD in Oct 2013 but first sx's probably started in the summer of 2013 with slowness of movement, R greater than L.  He does state that non-motor sx's like constipation occurred several years prior to this.  He was in [place] at the time.  He was initially started on Azilect in October 2013 and about 6 months later requip was added.  He was started initially at requip XL 2 mg and has worked up to requip XL 6 mg (the last increase was 10/2013).  He noticed a benefit with the requip but the XL is very expensive and asks about that.  He initially had some mental foginess with requip but that is better. Also asks about azilect as hasn't seen a benefit and is costing him $300.  Levodopa was then added by Dr. [name] in 03/2014.  It helped with cramping, rigidity.  He takes it at 4 am (but then goes back to sleep until 6am), 12pm, 8pm (bed at 8:30pm). He exercises faithfully.  He has neck turning to the right and has since the dx.  Some pain.     Specific Other Symptoms:  Tremor: Yes.  , only with pushing/pulling something or with stress Voice: weaker than in the past Sleep: sleeps well  Vivid Dreams:  Yes.    Acting out dreams:  Yes.   Wet Pillows: Yes.   (occasionally) Postural symptoms:  No. (unless gets up quick)  Falls?  No. Bradykinesia symptoms: slow movements; able to get up better now that on levodopa Loss of smell:  yes Loss of taste:  No. Urinary Incontinence: minimal - better with levodopa Difficulty Swallowing:  Yes.   but better now (would choke on saliva) Handwriting, micrographia: Yes.   (R hand dominant) Trouble with ADL's:  No. (shaving is easier now  that on levodopa; wife helps put on belt)  Trouble buttoning clothing: No. Depression:  No. Memory changes:  Yes.   but minor Hallucinations:  No.  visual distortions:rare N/V:  No. Lightheaded:  No.  Syncope: No. Diplopia:  No. Dyskinesia:  No.  Neuroimaging has previously been performed.  It is not available for my review today.  This was done in [place] prior to the diagnosis.  The patient does state that he had a TIA in 2007.  He was working in the yard on a hot day and he had just gone in and he sat down and felt a pain in the right side and tinnitus in the right ear and he had R face droop on the way to the hospital and R arm was "curling" on the way to the hospital.  He states that he was out of the window for TPA but was given IV heparin but was back to normal by 4 am (started at 5:30 pm but was at hospital by 6:30 pm so was unclear why out of TPA window).    10/26/14 update:  The patient returns today for follow-up, accompanied by his wife who supplements the history.  Last visit, changed him from Requip XL, 6 mg daily to ropinirole 2 mg 3 times per day, solely because of cost.  His Azilect was also discontinued because of cost.  His levodopa was increased to carbidopa/levodopa 25/100, 1-1/2 tablets 3 times per day.  I planned to order an MRI of the brain and cervical spine, but this was canceled by the patient because of cost.  Today, the patient states that he feels better overall and his gait is better; he no longer feels "foggy."  He is exercising.  He is doing cardio at [employer] and doing weights and cardio.  No falls.  Occ lightheadedness.  No syncope.  No hallucinations.  Very vivid dreams.  Is talking in sleep.  No hitting wife at night.  Not falling out bed.  "I feel better than I have in years."  02/27/15 update:  The patient is following up today regarding his Parkinson's disease.  He is currently on carbidopa/levodopa 25/100, 1-1/2 tablets 3 times per day in addition to  ropinirole, 2 mg 3 times per day (5-6am/11-12/and then q hs).  He had one fall on 12/30/14 since last visit.  He tripped over an uneven place in the sidewalk.  He states that he had rib pain but has recovered.  No syncope or lightheadedness.  No hallucinations.  Having very vivid dreams.  He will flail the arms and scream out but has not gotten hurt.  Wife states that it is actually a little better than in the past.  He started on melatonin and that helped his sleep.  He is noting stiffness in the neck and has trouble looking up.  He points to discomfort in the trapezius.  Late in the evening, he feels that his legs will "twitch" and he cannot sit still.  If he moves around, it will go away.    Asks me about changing his levitra to viagra.    06/27/15 update:  The patient is following up today regarding his Parkinson's disease.  He is currently on carbidopa/levodopa 25/100, 1-1/2 tablets 3 times per day in addition to ropinirole, 2 mg 3 times per day (5-6am/11-12/and then q hs).  States that he strained his back a few weeks ago (just moved wrong) and therefore and hasn't been as active.  Also has noted some tightness in his neck/trap and it has caused some nausea.  Doesn't think that it is at all related to wearing off of medication.  Still going to [employer] but not as vigorous with exercise.  No falls.  No lightheadedness.  Wife moved bedrooms but she can still sometimes hear him acting out the dream.    08/02/15 update:  The patient is following up today, much earlier than expected. This patient is accompanied in the office by his spouse who supplements the history. I have reviewed prior records made available to me.  He remains on Requip, 2 mg 3 times per day as well as carbidopa/levodopa 25/100, 1-1/2 tablets 3 times per day.  He presented to the emergency room on 07/03/2015 with community-acquired pneumonia and was treated with Levaquin.  He was taking an over-the-counter decongestant and NyQuil for his  symptoms.  He went back to the emergency room on 07/06/2015 with palpitations and it was determined that he was in atrial fibrillation and it was thought that it was due to the over-the-counter decongestant.  He spontaneously converted back to normal sinus rhythm in the ED.  The emergency room physician consulted with the cardiologist over the telephone and started him on amiodarone.  The patient was to follow-up with the cardiologist.  I do not have any  other cardiology records.  The patient states that he did a nuclear stress test and "passed that."  He is going to leave him on the amiodarone lifetime per the patient.   Pt states that since he was put on that he has been sluggish.  He is having some lightheadedness especially in the AM and it has affected his AM workout routine.  He feels that his PD has worsened and feels more tremulous.  He was going to get LSVT, but states that the therapy was very expensive for him.  11/26/15 update:  The patient follows up today, accompanied by his wife who supplements the history.  I have had the opportunity to review prior records made available to me.  He remains on ropinirole, 2 mgrams 3 times a day and last visit I increased his carbidopa/levodopa 25/100-2 tablets 3 times per day.  We had also requested a second opinion on whether he needed lifetime amiodarone, as that was making his Parkinson's worse.  He saw Dr. Stanford Breed who discontinued the amiodarone.  He has since followed up with Dr.Gollan since he is closer to his home in [place].  The patient is now just on metoprolol for rate control.  He is exercising 3 days a week.  No falls.  No hallucinations.  Talks in his sleep.  No neck pain issues.   03/27/16 update:  The patient follows up today, with his wife who supplements the history.  He is on ropinirole, 2 mg 3 times a day and carbidopa/levodopa 25/100, 2 tablets 3 times a day.  Noting some cognitive fogginess and wife noting some memory change as well,  especially if he is under pressure.  Cognitive change described as coming and going and just not feeling well.  Pt denies falls.  Pt denies lightheadedness, near syncope.  No hallucinations.  Mood has been good but occasionally feels "sorry for myself."  In regards to RBD, states that has been good and not acting out dreams.  Exercising at [employer] but having more trouble doing this than in the past and doesn't enjoy like he used to.  07/03/16 update:  Patient is following up today, accompanied by his wife who supplements the history.  Patient remains on carbidopa/levodopa 25/100, 2 tablets 3 times per day and ropinirole, 2 mg 3 times per day.  He has not had any hallucinations.  No sleep attacks.  No compulsive behaviors.  His metoprolol was reduced in half after we talked last visit and he states that he ended up having heart palpitations so he went back up on it.  Wife thinks that the motivation/fatigue is better. He is having some dyskinesia.  Wife notices it some.   He has had no falls.  11/25/16 update:  Patient seen today for his Parkinson's disease, accompanied by his wife who supplements the history.  Patient is on ropinirole, 2 mg 3 times per day and carbidopa/levodopa 25/100, 2 tablets 3 times per day (6am/12pm/5-6pm).  Pt denies falls.  Pt denies lightheadedness, near syncope.  No hallucinations.  Mood has been fair (not been able to exercise as much as he would like).  In regards to dyskinesia he states that he is doing about the same .  He had a plantar wart removed and so had trouble exercising but he is getting better.  No new medical problems.  He is losing weight through diet.    03/27/17 update: Patient seen today in follow-up for Parkinson's disease.  He is accompanied by his wife  who supplements the history.  Patient remains on ropinirole, 2 mg 3 times per day.  He has had no sleep attacks.  No compulsive behaviors.  He is also on carbidopa/levodopa 25/100, 2 tablets 3 times per day.  Pt  denies falls.  Pt denies lightheadedness, near syncope.  No hallucinations.  Mood has been good.  Going to [employer] 3 days a week and works out 2 other days a week.  Wife concerned about memory change.  Has some word finding trouble.  Never really forgets his medication.  Sometimes has to be reminded, but generally takes it on his own.  07/27/17 update: Patient is seen today in follow-up.  Patient is on carbidopa/levodopa 25/100, 2/2/2/1, which was increased last visit.  He is also on ropinirole, 2 mg 3 times per day.  He has had no compulsive behaviors or sleep attacks.  No lightheadedness or near syncope.  No hallucinations.  No falls.  He continues to exercise.   Wife sends in a list.  More dyskinesia.  Some depression.  Some short term memory.  Poor motivation.  Still exercising [employer] 2-3 days per week.  Shuffles some when walking short distances.   Records have been reviewed since our last visit.  He was seen by hematology for his history of DVT, for which he is on Xarelto.  He remains on that medication.  Wife has been in the hospital and just was d/c.  PREVIOUS MEDICATIONS: Sinemet, Requip and azilect  ALLERGIES:   Allergies  Allergen Reactions  . Codeine Other (See Comments)    Reaction: Hallucinations    CURRENT MEDICATIONS:  Outpatient Encounter Medications as of 07/27/2017  Medication Sig  . carbidopa-levodopa (SINEMET IR) 25-100 MG tablet TAKE 2 TABLETS AT 6 AM, 10 AM, AND 2 PM, AND 1 TABLET AT 6 PM AS DIRECTED.  Marland Kitchen cetirizine (ZYRTEC) 10 MG tablet Take 10 mg by mouth daily.  Marland Kitchen lisinopril (PRINIVIL,ZESTRIL) 30 MG tablet Take 1 tablet (30 mg total) by mouth daily.  . Melatonin 2.5 MG CAPS Take 1 capsule by mouth at bedtime as needed (for sleep).   . metoprolol succinate (TOPROL-XL) 25 MG 24 hr tablet TAKE 1 TABLET EVERY DAY  . rivaroxaban (XARELTO) 20 MG TABS tablet Take 1 tablet (20 mg total) by mouth daily.  Marland Kitchen rOPINIRole (REQUIP) 2 MG tablet TAKE 1 TABLET THREE TIMES DAILY  . sildenafil  (REVATIO) 20 MG tablet Take 1 tablet (20 mg total) by mouth 3 (three) times daily as needed.  . simvastatin (ZOCOR) 40 MG tablet Take 1 tablet (40 mg total) by mouth daily.  . [DISCONTINUED] metoprolol succinate (TOPROL-XL) 25 MG 24 hr tablet Take 1 tablet (25 mg total) by mouth daily.   No facility-administered encounter medications on file as of 07/27/2017.     PAST MEDICAL HISTORY:   Past Medical History:  Diagnosis Date  . Allergy   . Arthritis    Osteoarthritis  . Arthritis of knee   . Atrial fibrillation ([facility])   . DVT (deep venous thrombosis) ([facility])    L leg  . Hyperchloremia   . Hypercholesterolemia   . Hypertension   . Nephrolithiasis   . Parkinson's disease ([facility])   . Plantar wart   . Pulmonary embolism ([facility])   . Seasonal allergies   . TIA (transient ischemic attack)     PAST SURGICAL HISTORY:   Past Surgical History:  Procedure Laterality Date  . APPENDECTOMY    . KNEE ARTHROSCOPY WITH MENISCAL REPAIR Right 2009  .  KNEE SURGERY Right   . T&A    . TONSILLECTOMY      SOCIAL HISTORY:   Social History   Socioeconomic History  . Marital status: Married    Spouse name: Not on file  . Number of children: Not on file  . Years of education: Not on file  . Highest education level: Not on file  Occupational History  . Occupation: retired    Comment: Engineer, maintenance  . Financial resource strain: Not hard at all  . Food insecurity:    Worry: Never true    Inability: Never true  . Transportation needs:    Medical: No    Non-medical: No  Tobacco Use  . Smoking status: Never Smoker  . Smokeless tobacco: Never Used  Substance and Sexual Activity  . Alcohol use: No    Alcohol/week: 0.0 oz  . Drug use: No  . Sexual activity: Not on file  Lifestyle  . Physical activity:    Days per week: 3 days    Minutes per session: 80 min  . Stress: Not at all  Relationships  . Social connections:    Talks on phone: Once a week    Gets together: More than three  times a week    Attends religious service: More than 4 times per year    Active member of club or organization: Yes    Attends meetings of clubs or organizations: More than 4 times per year    Relationship status: Married  . Intimate partner violence:    Fear of current or ex partner: No    Emotionally abused: No    Physically abused: No    Forced sexual activity: No  Other Topics Concern  . Not on file  Social History Narrative   ** Merged History Encounter **        FAMILY HISTORY:   Family Status  Relation Name Status  . Father  Deceased       PD  . Mother  Deceased       CHF  . Sister  Deceased       CHF  . Sister  Deceased       breast cancer  . Sister  Alive       heart disease  . Sister  (Not Specified)  . Sister  (Not Specified)    ROS:  Review of Systems  Constitutional: Negative.   HENT: Negative.   Eyes: Negative.   Respiratory: Negative.   Cardiovascular: Negative.   Genitourinary: Negative.   Musculoskeletal: Negative.   Skin: Negative.   Neurological: Negative.   Endo/Heme/Allergies: Negative.   Psychiatric/Behavioral: Positive for depression (per wife; he isn't sure) and memory loss.    PHYSICAL EXAMINATION:    VITALS:   Vitals:   07/27/17 0841  BP: 98/62  Pulse: (!) 52  SpO2: 97%  Weight: 248 lb (112.5 kg)  Height: 5\' 10"  (1.778 m)   Wt Readings from Last 3 Encounters:  07/27/17 248 lb (112.5 kg)  05/04/17 249 lb 4 oz (113.1 kg)  05/01/17 247 lb 1.6 oz (112.1 kg)     GEN:  The patient appears stated age and is in NAD. HEENT:  Normocephalic, atraumatic.  The mucous membranes are moist. The superficial temporal arteries are without ropiness or tenderness. CV:  Bradycardic.  Regular rhythm. Lungs:  CTAB Neck/HEME:  There are no carotid bruits bilaterally.  The neck is slightly turned into sidebending to the right.  Neurological examination:  Orientation:  The patient is alert and oriented x3. Cranial nerves: There is good facial  symmetry.  Extraocular muscles are intact. The visual fields are full to confrontational testing. The speech is fluent and clear. Soft palate rises symmetrically and there is no tongue deviation. Hearing is intact to conversational tone. Sensation: Sensation is intact to light touch throughout Motor: Strength is 5/5 in the UE/LE   Movement examination: Tone: There is very mild increased tone in the right upper extremity (same as previous). Abnormal movements: There is mild axial dyskinesia Coordination:  There is decremation with toe taps/heel taps in the right leg.   Gait and Station: The patient has no difficulty today arising without the use of his hands.  The patient's stride length is good today with good arm swing.  He has a negative pull test.    Chemistry      Component Value Date/Time   NA 144 05/04/2017 1335   K 4.5 05/04/2017 1335   CL 107 (H) 05/04/2017 1335   CO2 25 05/04/2017 1335   BUN 16 05/04/2017 1335   CREATININE 0.92 05/04/2017 1335      Component Value Date/Time   CALCIUM 9.0 05/04/2017 1335   ALKPHOS 85 05/04/2017 1335   AST 9 05/04/2017 1335   ALT 7 05/04/2017 1335   BILITOT 0.4 05/04/2017 1335     Lab Results  Component Value Date   TSH 1.120 05/04/2017      ASSESSMENT/PLAN:  1.  Idiopathic Parkinson's disease, diagnosed in October 2013 in [place] with nonmotor symptoms that preceded this for several years.  Father had PD as well.    -continue ropinirole 2 mg 3 times.  No evidence of compulsive behaviors. Talked about whether to decrease this based on perceived cognitive change and decided to hold on that  -continue carbidopa/levodopa 25/100 2 tablets at 6am/ 2 tablets at 10am/ 2 tablets at 2pm/1 tablet at 6pm.  Risks, benefits, side effects and alternative therapies were discussed.  The opportunity to ask questions was given and they were answered to the best of my ability.  The patient expressed understanding and willingness to follow the  outlined treatment protocols.  -discussed dyskinesia.  He notes the dyskinesia but doesn't want medication.  Will let me know if changes mind  -Talked about DBS therapy now that he has had some motor fluctuations.  Discussed in detail.  He does not think that he is "mentally ready" for that right now. 2.  Constipation, associated with Parkinson's disease.  -Patient doing well with prunes, occasional miralax and has rancho recipe 3.  Hyperreflexia but neck pain better per patient  -very hyperreflexic but doesn't wish to pursue MRI cervical spine 4.  REM behavior disorder  -not on meds.  Wife no longer sleeps with him.  Still doesn't want meds.  Will let me know if changes mind 5.  A-fib  -worse when on amiodarone but been in sinus for a long time.  -He tried to decrease metoprolol, but had more palpitations and went back up on it.  6. Memory change  -doubt PDD.  However, discussed neurocognitive testing in detail.  This may be important, because we could decrease the ropinirole.  He doesn't wish to do this right now.  Talked to him about reducing requip and ultimately decided to hold.  I haven't seen a great decline in this regard.  He agrees 7.  Depression  -discussed medication.  Discussed that most patients with PD are on something for mood.  He wants  to hold.  Will discuss with his wife 8.  Follow up is anticipated in the next few months, sooner should new neurologic issues arise.

## 2017-07-27 ENCOUNTER — Encounter: Payer: Self-pay | Admitting: Neurology

## 2017-07-27 ENCOUNTER — Ambulatory Visit: Payer: Medicare HMO | Admitting: Neurology

## 2017-07-27 VITALS — BP 98/62 | HR 52 | Ht 70.0 in | Wt 248.0 lb

## 2017-07-27 DIAGNOSIS — F330 Major depressive disorder, recurrent, mild: Secondary | ICD-10-CM

## 2017-07-27 DIAGNOSIS — G249 Dystonia, unspecified: Secondary | ICD-10-CM | POA: Diagnosis not present

## 2017-07-27 DIAGNOSIS — R413 Other amnesia: Secondary | ICD-10-CM

## 2017-07-27 DIAGNOSIS — G20 Parkinson's disease: Secondary | ICD-10-CM | POA: Diagnosis not present

## 2017-07-27 NOTE — Patient Instructions (Signed)
  [facility]  The [facility] team know that living well with a movement disorder extends far beyond our clinic walls. We are together with you. Our team is passionate about providing resources to you and your loved ones who are living with Parkinson's disease and movement disorders. Participate in these programs and join our community. These resources are free or low cost!   [facility] is adding:   Innovative educational programs for patients and caregivers.   Support groups for patients and caregivers living with Parkinson's disease.   Parkinson's specific exercise programs.   Custom tailored therapeutic programs that will benefit patient's living with Parkinson's disease.   We are in this together. You can help and contribute to grow these programs and resources in our community. 100% of the funds donated to the Movement Disorders Fund stays right here in our community to support patients and their caregivers.  To make a tax deductible contribution:  -ask for a [facility]'s envelope in the office today.  - call the Office of Institutional Advancement at [phone].         

## 2017-08-18 ENCOUNTER — Other Ambulatory Visit: Payer: Self-pay | Admitting: Cardiovascular Disease

## 2017-09-01 DIAGNOSIS — H2513 Age-related nuclear cataract, bilateral: Secondary | ICD-10-CM | POA: Diagnosis not present

## 2017-09-01 DIAGNOSIS — Z0100 Encounter for examination of eyes and vision without abnormal findings: Secondary | ICD-10-CM | POA: Diagnosis not present

## 2017-09-22 ENCOUNTER — Other Ambulatory Visit: Payer: Self-pay | Admitting: Family Medicine

## 2017-10-12 ENCOUNTER — Other Ambulatory Visit: Payer: Self-pay | Admitting: Cardiovascular Disease

## 2017-10-13 ENCOUNTER — Telehealth: Payer: Self-pay | Admitting: Cardiovascular Disease

## 2017-10-13 NOTE — Telephone Encounter (Signed)
-----   Message from Janan Ridge, [place] sent at 10/13/2017  8:54 AM EDT ----- Regarding: Patient needs an appointment with Dr. Rockey Situ Can you please try to schedule an appointment with Dr. Rockey Situ.  If patient does not want to make an appointment please see if Primary Care can handle refills for patient.   I will send in a 30 day supply with 0 refills until patient is contacted.

## 2017-10-13 NOTE — Telephone Encounter (Signed)
Pt scheduled for July 24th

## 2017-10-13 NOTE — Telephone Encounter (Signed)
Attempted to call patient and schedule appointment lmov for patient to call back and schedule  Will try again at a later time

## 2017-10-19 ENCOUNTER — Other Ambulatory Visit: Payer: Self-pay | Admitting: Family Medicine

## 2017-11-01 NOTE — Progress Notes (Signed)
Patient ID: Thomas [name], male   DOB: 04/03/1951, 67 y.o.   MRN: [id number] Cardiology Office Note  Date:  11/04/2017   ID:  Thomas [name], DOB 1950-05-17, MRN [id number]  PCP:  [name], DO   Chief Complaint  Patient presents with  . other    12 month follow up. Meds reviewed by the pt. verbally. "doing well."     HPI:  67 yo male with a history of  DVT, PE on chronic anticoagulation Parkinson's,  paroxysmal atrial fibrillation,  on xarelto,  07/2015 DJD on CT scan thoracic spine No prior smoking history, no diabetes, cholesterol well controlled on a statin who presents for follow-up of his atrial fibrillation   In follow-up today reports he feels well  exercising 3 days per week, therapy, rock steady boxing No significant shortness of breath on exertion, no leg edema Tolerating anticoagulation Denies any tachycardia concerning for atrial fibrillation No chest pain on exertion  Lab work reviewed with him Total chol 120  EKG personally reviewed by myself on todays visit Shows normal sinus rhythm with rate 58 bpm rare PVCs  Other past medical history reviewed 06/23/2014 Lower extremity ultrasound revealed persistent partially occlusive thrombus in the left posterior tibial, popliteal, and mid femoral veins.     01/02/2015 Left lower extremity duplex  revealed persistent but improving nonocclusive partial thrombus in the femoral and popliteal veins.  Calf veins were patient.    Chest CT on 06/23/2014 revealed no pulmonary nodules or adenopathy.    He did not do well on amiodarone, felt this made his tremor worse He is followed by neurology in [place], Dr. Carles Collet  Previously seen in the emergency room in the setting of upper respiratory infection, found to be in atrial fibrillation, converted in the emergency room after he was given amiodarone   Labs 3/17 showed Hgb 12.8, K 3.7, BUN/Cr 18/1.15, TSH 2.090.   Other past medical history Echocardiogram April 2017 in  [place] showed biatrial enlargement, normal LV function, mild left ventricular hypertrophy, mild aortic and mitral regurgitation.   Nuclear study April 2017 showed ejection fraction 68% and normal perfusion.   On the day he had his atrial fibrillation he noticed sudden onset of palpitations followed by discomfort in his back and arms bilaterally. This resolved when his atrial fibrillation converted to sinus rhythm. He has had no bouts since then. He has noticed that the amiodarone has exacerbated his Parkinson's.  PMH:   has a past medical history of Allergy, Arthritis, Arthritis of knee, Atrial fibrillation ([place]), DVT (deep venous thrombosis) (HCC), Hyperchloremia, Hypercholesterolemia, Hypertension, Nephrolithiasis, Parkinson's disease ([place]), Plantar wart, Pulmonary embolism ([place]), Seasonal allergies, and TIA (transient ischemic attack).  PSH:    Past Surgical History:  Procedure Laterality Date  . APPENDECTOMY    . KNEE ARTHROSCOPY WITH MENISCAL REPAIR Right 2009  . KNEE SURGERY Right   . T&A    . TONSILLECTOMY      Current Outpatient Medications  Medication Sig Dispense Refill  . carbidopa-levodopa (SINEMET IR) 25-100 MG tablet TAKE 2 TABLETS AT 6 AM, 10 AM, AND 2 PM, AND 1 TABLET AT 6 PM AS DIRECTED. 630 tablet 1  . cetirizine (ZYRTEC) 10 MG tablet Take 10 mg by mouth daily.    Marland Kitchen lisinopril (PRINIVIL,ZESTRIL) 30 MG tablet TAKE 1 TABLET EVERY DAY 90 tablet 1  . Melatonin 2.5 MG CAPS Take 1 capsule by mouth at bedtime as needed (for sleep).     . metoprolol succinate (TOPROL-XL) 25 MG 24  hr tablet TAKE 1 TABLET EVERY DAY  ( NEED MD APPOINTMENT FOR REFILLS  ) 60 tablet 0  . predniSONE (DELTASONE) 50 MG tablet Take 1 tablet (50 mg total) by mouth daily with breakfast. 5 tablet 0  . rivaroxaban (XARELTO) 20 MG TABS tablet Take 1 tablet (20 mg total) by mouth daily. 90 tablet 3  . rOPINIRole (REQUIP) 2 MG tablet TAKE 1 TABLET THREE TIMES DAILY 270 tablet 1  . sildenafil (REVATIO) 20 MG  tablet Take 1 tablet (20 mg total) by mouth 3 (three) times daily as needed. 90 tablet 6  . simvastatin (ZOCOR) 40 MG tablet TAKE 1 TABLET EVERY DAY 90 tablet 1   No current facility-administered medications for this visit.      Allergies:   Codeine   Social History:  The patient  reports that he has never smoked. He has never used smokeless tobacco. He reports that he does not drink alcohol or use drugs.   Family History:   family history includes Coronary artery disease in his mother; Deep vein thrombosis in his sister; Heart attack in his sister; Parkinson's disease in his father.    Review of Systems: Review of Systems  Constitutional: Negative.   Respiratory: Negative.   Cardiovascular: Negative.   Gastrointestinal: Negative.   Musculoskeletal: Negative.   Neurological: Negative.   Psychiatric/Behavioral: Negative.   All other systems reviewed and are negative.    PHYSICAL EXAM: VS:  BP 120/80 (BP Location: Left Arm, Patient Position: Sitting, Cuff Size: Normal)   Pulse (!) 58   Ht 5\' 10"  (1.778 m)   Wt 248 lb 8 oz (112.7 kg)   BMI 35.66 kg/m  , BMI Body mass index is 35.66 kg/m. Constitutional:  oriented to person, place, and time. No distress.  HENT:  Head: Normocephalic and atraumatic.  Eyes:  no discharge. No scleral icterus.  Neck: Normal range of motion. Neck supple. No JVD present.  Cardiovascular: Normal rate, regular rhythm, normal heart sounds and intact distal pulses. Exam reveals no gallop and no friction rub. No edema No murmur heard. Pulmonary/Chest: Effort normal and breath sounds normal. No stridor. No respiratory distress.  no wheezes.  no rales.  no tenderness.  Abdominal: Soft.  no distension.  no tenderness.  Musculoskeletal: Normal range of motion.  no  tenderness or deformity.  Neurological:  normal muscle tone. Coordination normal. No atrophy Skin: Skin is warm and dry. No rash noted. not diaphoretic.  Psychiatric:  normal mood and affect.  behavior is normal. Thought content normal.    Recent Labs: 05/04/2017: TSH 1.120 11/02/2017: ALT <5; BUN 19; Creatinine, Ser 0.97; Hemoglobin 13.7; Platelets 166; Potassium 4.5; Sodium 146    Lipid Panel Lab Results  Component Value Date   CHOL 118 11/02/2017   HDL 41 11/02/2017   LDLCALC 65 11/02/2017   TRIG 58 11/02/2017      Wt Readings from Last 3 Encounters:  11/04/17 248 lb 8 oz (112.7 kg)  11/02/17 249 lb (112.9 kg)  07/27/17 248 lb (112.5 kg)     ASSESSMENT AND PLAN:  Paroxysmal atrial fibrillation (HCC) - Plan: EKG 12-Lead Maintaining normal sinus rhythm. No medication changes made. Tolerating anticoagulation. No changes to his regiment  PVCs Asymptomatic, no significant room on his beta blocker to go up Discussed with him in detail, no further workup or medication changes given he's not having symptoms Will watch for now. If burden increases or become symptomatic could wear a monitor  Hypercholesterolemia Cholesterol is at goal  on the current lipid regimen. No changes to the medications were made. stable  Essential hypertension Blood pressure is well controlled on today's visit. No changes made to the medications. . Recommended a check orthostatic pressures at home. Risk of orthostasis given Parkinson's If blood pressure does run low in standing position could decrease lisinopril  Deep vein thrombosis (DVT) of left lower extremity, unspecified chronicity, unspecified vein (HCC) Previous ultrasound in 2016 showing mild improvement of a nonocclusive clot left popliteal vein  On chronic anticoagulation . o changes to his regiment  Other chronic pulmonary embolism (HCC) On chronic anticoagulation   Parkinson's disease ([place]) Managed by neurology, exercising 3 days per week , doing a boxing regiment Weight still elevated but stable Recommended low carbohydrates, walking program for weight loss on alternating exercise days    Total encounter time more than 25  minutes  Greater than 50% was spent in counseling and coordination of care with the patient   Disposition:   F/U  12 months   Orders Placed This Encounter  Procedures  . EKG 12-Lead     Signed, Esmond Plants, M.D., Ph.D. 11/04/2017  [place], [place]

## 2017-11-02 ENCOUNTER — Ambulatory Visit (INDEPENDENT_AMBULATORY_CARE_PROVIDER_SITE_OTHER): Payer: Medicare HMO | Admitting: Family Medicine

## 2017-11-02 ENCOUNTER — Encounter: Payer: Self-pay | Admitting: Family Medicine

## 2017-11-02 VITALS — BP 131/80 | HR 55 | Temp 97.8°F | Wt 249.0 lb

## 2017-11-02 DIAGNOSIS — I10 Essential (primary) hypertension: Secondary | ICD-10-CM

## 2017-11-02 DIAGNOSIS — I480 Paroxysmal atrial fibrillation: Secondary | ICD-10-CM | POA: Diagnosis not present

## 2017-11-02 DIAGNOSIS — G20 Parkinson's disease: Secondary | ICD-10-CM | POA: Diagnosis not present

## 2017-11-02 DIAGNOSIS — I82502 Chronic embolism and thrombosis of unspecified deep veins of left lower extremity: Secondary | ICD-10-CM | POA: Diagnosis not present

## 2017-11-02 DIAGNOSIS — E7800 Pure hypercholesterolemia, unspecified: Secondary | ICD-10-CM | POA: Diagnosis not present

## 2017-11-02 DIAGNOSIS — I2782 Chronic pulmonary embolism: Secondary | ICD-10-CM | POA: Diagnosis not present

## 2017-11-02 DIAGNOSIS — J302 Other seasonal allergic rhinitis: Secondary | ICD-10-CM | POA: Diagnosis not present

## 2017-11-02 MED ORDER — SILDENAFIL CITRATE 20 MG PO TABS: 20.0000 mg | ORAL_TABLET | Freq: Three times a day (TID) | ORAL | 6 refills | Status: DC | PRN

## 2017-11-02 MED ORDER — FLUTICASONE PROPIONATE 50 MCG/ACT NA SUSP: 2.0000 | Freq: Every day | NASAL | 6 refills | Status: DC

## 2017-11-02 MED ORDER — PREDNISONE 50 MG PO TABS: 50.0000 mg | ORAL_TABLET | Freq: Every day | ORAL | 0 refills | Status: DC

## 2017-11-02 NOTE — Assessment & Plan Note (Signed)
Continue to follow with cardiology- has appointment in 2 days. Checking CBC today. Continue with xarelto per hematology. Call with any concerns.

## 2017-11-02 NOTE — Assessment & Plan Note (Signed)
Checking CBC today. Continue with xarelto per hematology. Call with any concerns.

## 2017-11-02 NOTE — Progress Notes (Signed)
BP 131/80 (BP Location: Left Arm, Patient Position: Sitting, Cuff Size: Normal)   Pulse (!) 55   Temp 97.8 F (36.6 C)   Wt 249 lb (112.9 kg)   SpO2 97%   BMI 35.73 kg/m    Subjective:    Patient ID: Thomas [name], male    DOB: 1950-06-09, 67 y.o.   MRN: [id number]  HPI: Thomas [name] is a 67 y.o. male  Chief Complaint  Patient presents with  . Hypertension  . Hyperlipidemia   Parkinson's is acting up quite a bit for the last month. Went on vacation at the end of June and got out of his routine- has been doing worse since then, but slowly starting to get better again.   HYPERTENSION / HYPERLIPIDEMIA Satisfied with current treatment? yes Duration of hypertension: chronic BP monitoring frequency: not checking BP medication side effects: no Past BP meds: lisinopril Duration of hyperlipidemia: chronic Cholesterol medication side effects: no Cholesterol supplements: none Past cholesterol medications: simvastatin Medication compliance: excellent compliance Aspirin: no Recent stressors: no Recurrent headaches: no Visual changes: no Palpitations: no Dyspnea: no Chest pain: no Lower extremity edema: no Dizzy/lightheaded: no  UPPER RESPIRATORY TRACT INFECTION Duration: 2 weeks Worst symptom: congestion Fever: no Cough: yes Shortness of breath: no Wheezing: no Chest pain: no Chest tightness: no Chest congestion: no Nasal congestion: yes Runny nose: yes Post nasal drip: yes Sneezing: yes Sore throat: yes Swollen glands: no Sinus pressure: no Headache: no Face pain: no Toothache: no Ear pain: no  Ear pressure: yes bilateral Eyes red/itching:yes Eye drainage/crusting: yes  Vomiting: no Rash: no Fatigue: yes Sick contacts: no Strep contacts: no  Context: better Recurrent sinusitis: no Relief with OTC cold/cough medications: no  Treatments attempted: anti-histamine    Relevant past medical, surgical, family and social history reviewed and updated as  indicated. Interim medical history since our last visit reviewed. Allergies and medications reviewed and updated.  Review of Systems  Constitutional: Negative.   HENT: Positive for congestion, postnasal drip, rhinorrhea, sneezing and sore throat. Negative for dental problem, drooling, ear discharge, ear pain, facial swelling, hearing loss, mouth sores, nosebleeds, sinus pressure, sinus pain, tinnitus, trouble swallowing and voice change.   Respiratory: Negative.   Cardiovascular: Negative.   Neurological: Positive for tremors. Negative for dizziness, seizures, syncope, facial asymmetry, speech difficulty, weakness, light-headedness, numbness and headaches.  Psychiatric/Behavioral: Negative.     Per HPI unless specifically indicated above     Objective:    BP 131/80 (BP Location: Left Arm, Patient Position: Sitting, Cuff Size: Normal)   Pulse (!) 55   Temp 97.8 F (36.6 C)   Wt 249 lb (112.9 kg)   SpO2 97%   BMI 35.73 kg/m   Wt Readings from Last 3 Encounters:  11/02/17 249 lb (112.9 kg)  07/27/17 248 lb (112.5 kg)  05/04/17 249 lb 4 oz (113.1 kg)    Physical Exam  Constitutional: He is oriented to person, place, and time. He appears well-developed and well-nourished. No distress.  HENT:  Head: Normocephalic and atraumatic.  Right Ear: Hearing and external ear normal.  Left Ear: Hearing and external ear normal.  Nose: Mucosal edema and rhinorrhea present. Right sinus exhibits no maxillary sinus tenderness and no frontal sinus tenderness. Left sinus exhibits no maxillary sinus tenderness and no frontal sinus tenderness.  Mouth/Throat: Oropharynx is clear and moist. No oropharyngeal exudate.  Eyes: Conjunctivae and lids are normal. Right eye exhibits no discharge. Left eye exhibits no discharge. No scleral icterus.  Cardiovascular:  Normal rate, regular rhythm, normal heart sounds and intact distal pulses. Exam reveals no gallop and no friction rub.  No murmur  heard. Pulmonary/Chest: Effort normal and breath sounds normal. No stridor. No respiratory distress. He has no wheezes. He has no rales. He exhibits no tenderness.  Musculoskeletal: Normal range of motion.  Neurological: He is alert and oriented to person, place, and time.  Skin: Skin is warm, dry and intact. Capillary refill takes less than 2 seconds. No rash noted. He is not diaphoretic. No erythema. No pallor.  Psychiatric: He has a normal mood and affect. His speech is normal and behavior is normal. Judgment and thought content normal. Cognition and memory are normal.  Nursing note and vitals reviewed.   Results for orders placed or performed in visit on 05/04/17  Comprehensive metabolic panel  Result Value Ref Range   Glucose 94 65 - 99 mg/dL   BUN 16 8 - 27 mg/dL   Creatinine, Ser 0.92 0.76 - 1.27 mg/dL   GFR calc non Af Amer 86 >59 mL/min/1.73   GFR calc Af Amer 99 >59 mL/min/1.73   BUN/Creatinine Ratio 17 10 - 24   Sodium 144 134 - 144 mmol/L   Potassium 4.5 3.5 - 5.2 mmol/L   Chloride 107 (H) 96 - 106 mmol/L   CO2 25 20 - 29 mmol/L   Calcium 9.0 8.6 - 10.2 mg/dL   Total Protein 7.0 6.0 - 8.5 g/dL   Albumin 4.3 3.6 - 4.8 g/dL   Globulin, Total 2.7 1.5 - 4.5 g/dL   Albumin/Globulin Ratio 1.6 1.2 - 2.2   Bilirubin Total 0.4 0.0 - 1.2 mg/dL   Alkaline Phosphatase 85 39 - 117 IU/L   AST 9 0 - 40 IU/L   ALT 7 0 - 44 IU/L  CBC with Differential/Platelet  Result Value Ref Range   WBC 7.0 3.4 - 10.8 x10E3/uL   RBC 4.84 4.14 - 5.80 x10E6/uL   Hemoglobin 14.4 13.0 - 17.7 g/dL   Hematocrit 44.2 37.5 - 51.0 %   MCV 91 79 - 97 fL   MCH 29.8 26.6 - 33.0 pg   MCHC 32.6 31.5 - 35.7 g/dL   RDW 13.1 12.3 - 15.4 %   Platelets 180 150 - 379 x10E3/uL   Neutrophils 72 Not Estab. %   Lymphs 18 Not Estab. %   Monocytes 8 Not Estab. %   Eos 1 Not Estab. %   Basos 1 Not Estab. %   Neutrophils Absolute 5.1 1.4 - 7.0 x10E3/uL   Lymphocytes Absolute 1.3 0.7 - 3.1 x10E3/uL   Monocytes  Absolute 0.5 0.1 - 0.9 x10E3/uL   EOS (ABSOLUTE) 0.1 0.0 - 0.4 x10E3/uL   Basophils Absolute 0.1 0.0 - 0.2 x10E3/uL   Immature Granulocytes 0 Not Estab. %   Immature Grans (Abs) 0.0 0.0 - 0.1 x10E3/uL  Lipid Panel w/o Chol/HDL Ratio  Result Value Ref Range   Cholesterol, Total 126 100 - 199 mg/dL   Triglycerides 77 0 - 149 mg/dL   HDL 42 >39 mg/dL   VLDL Cholesterol Cal 15 5 - 40 mg/dL   LDL Calculated 69 0 - 99 mg/dL  Microalbumin, Urine Waived  Result Value Ref Range   Microalb, Ur Waived 30 (H) 0 - 19 mg/L   Creatinine, Urine Waived 100 10 - 300 mg/dL   Microalb/Creat Ratio <30 <30 mg/g  TSH  Result Value Ref Range   TSH 1.120 0.450 - 4.500 uIU/mL  UA/M w/rflx Culture, Routine  Result Value  Ref Range   Specific Gravity, UA 1.025 1.005 - 1.030   pH, UA 5.5 5.0 - 7.5   Color, UA Yellow Yellow   Appearance Ur Clear Clear   Leukocytes, UA Negative Negative   Protein, UA Negative Negative/Trace   Glucose, UA Negative Negative   Ketones, UA Trace (A) Negative   RBC, UA Negative Negative   Bilirubin, UA Negative Negative   Urobilinogen, Ur 1.0 0.2 - 1.0 mg/dL   Nitrite, UA Negative Negative  PSA  Result Value Ref Range   Prostate Specific Ag, Serum 4.6 (H) 0.0 - 4.0 ng/mL      Assessment & Plan:   Problem List Items Addressed This Visit      Cardiovascular and Mediastinum   Hypertension - Primary    Under good control. Continue current regimen. Continue to monitor. Call with any concerns. Refills given today.      Relevant Medications   sildenafil (REVATIO) 20 MG tablet   Other Relevant Orders   Comprehensive metabolic panel   DVT (deep venous thrombosis) (HCC)    Checking CBC today. Continue with xarelto per hematology. Call with any concerns.       Relevant Medications   sildenafil (REVATIO) 20 MG tablet   Pulmonary embolism (HCC)    Checking CBC today. Continue with xarelto per hematology. Call with any concerns.       Relevant Medications   sildenafil  (REVATIO) 20 MG tablet   Atrial fibrillation (HCC)    Continue to follow with cardiology- has appointment in 2 days. Checking CBC today. Continue with xarelto per hematology. Call with any concerns.       Relevant Medications   sildenafil (REVATIO) 20 MG tablet   Other Relevant Orders   CBC with Differential/Platelet   Comprehensive metabolic panel     Nervous and Auditory   Parkinson's disease ([place])    Not under great control since getting out of his routine for vacation. Will get back into his PT and follow up with neurology as needed. Call with any concerns.       Relevant Orders   Comprehensive metabolic panel     Other   Hypercholesterolemia    Under good control. Continue current regimen. Continue to monitor. Call with any concerns. Refills given today.      Relevant Medications   sildenafil (REVATIO) 20 MG tablet   Other Relevant Orders   Comprehensive metabolic panel   Lipid Panel w/o Chol/HDL Ratio    Other Visit Diagnoses    Seasonal allergic rhinitis, unspecified trigger       Will treat with a burst of prednisone followed by starting flonase. Call if not getting better.       Follow up plan: Return in about 6 months (around 05/05/2018) for Physical and wellness.

## 2017-11-02 NOTE — Assessment & Plan Note (Signed)
Not under great control since getting out of his routine for vacation. Will get back into his PT and follow up with neurology as needed. Call with any concerns.

## 2017-11-02 NOTE — Assessment & Plan Note (Signed)
Under good control. Continue current regimen. Continue to monitor. Call with any concerns. Refills given today. 

## 2017-11-03 ENCOUNTER — Encounter: Payer: Self-pay | Admitting: Family Medicine

## 2017-11-03 LAB — COMPREHENSIVE METABOLIC PANEL
A/G RATIO: 1.8 (ref 1.2–2.2)
ALT: <5 IU/L (ref 0–44)
AST: 8 IU/L (ref 0–40)
Albumin: 4.2 g/dL (ref 3.6–4.8)
Alkaline Phosphatase: 77 IU/L (ref 39–117)
BILIRUBIN TOTAL: 0.4 mg/dL (ref 0.0–1.2)
BUN/Creatinine Ratio: 20 (ref 10–24)
BUN: 19 mg/dL (ref 8–27)
CHLORIDE: 110 mmol/L — AB (ref 96–106)
CO2: 22 mmol/L (ref 20–29)
Calcium: 9.3 mg/dL (ref 8.6–10.2)
Creatinine, Ser: 0.97 mg/dL (ref 0.76–1.27)
GFR calc Af Amer: 92 mL/min/{1.73_m2} (ref >59)
GFR calc non Af Amer: 80 mL/min/{1.73_m2} (ref >59)
GLUCOSE: 101 mg/dL — AB (ref 65–99)
Globulin, Total: 2.4 g/dL (ref 1.5–4.5)
POTASSIUM: 4.5 mmol/L (ref 3.5–5.2)
Sodium: 146 mmol/L — ABNORMAL HIGH (ref 134–144)
TOTAL PROTEIN: 6.6 g/dL (ref 6.0–8.5)

## 2017-11-03 LAB — CBC WITH DIFFERENTIAL/PLATELET
BASOS ABS: 0 10*3/uL (ref 0.0–0.2)
Basos: 1 %
EOS (ABSOLUTE): 0.2 10*3/uL (ref 0.0–0.4)
Eos: 4 %
Hematocrit: 40.4 % (ref 37.5–51.0)
Hemoglobin: 13.7 g/dL (ref 13.0–17.7)
IMMATURE GRANS (ABS): 0 10*3/uL (ref 0.0–0.1)
IMMATURE GRANULOCYTES: 0 %
LYMPHS: 22 %
Lymphocytes Absolute: 1 10*3/uL (ref 0.7–3.1)
MCH: 30.7 pg (ref 26.6–33.0)
MCHC: 33.9 g/dL (ref 31.5–35.7)
MCV: 91 fL (ref 79–97)
MONOS ABS: 0.3 10*3/uL (ref 0.1–0.9)
Monocytes: 6 %
NEUTROS PCT: 67 %
Neutrophils Absolute: 3.2 10*3/uL (ref 1.4–7.0)
PLATELETS: 166 10*3/uL (ref 150–450)
RBC: 4.46 x10E6/uL (ref 4.14–5.80)
RDW: 13.4 % (ref 12.3–15.4)
WBC: 4.7 10*3/uL (ref 3.4–10.8)

## 2017-11-03 LAB — LIPID PANEL W/O CHOL/HDL RATIO
CHOLESTEROL TOTAL: 118 mg/dL (ref 100–199)
HDL: 41 mg/dL (ref >39)
LDL Calculated: 65 mg/dL (ref 0–99)
TRIGLYCERIDES: 58 mg/dL (ref 0–149)
VLDL CHOLESTEROL CAL: 12 mg/dL (ref 5–40)

## 2017-11-04 ENCOUNTER — Encounter: Payer: Self-pay | Admitting: Cardiovascular Disease

## 2017-11-04 ENCOUNTER — Ambulatory Visit: Payer: Medicare HMO | Admitting: Cardiovascular Disease

## 2017-11-04 VITALS — BP 120/80 | HR 58 | Ht 70.0 in | Wt 248.5 lb

## 2017-11-04 DIAGNOSIS — I10 Essential (primary) hypertension: Secondary | ICD-10-CM

## 2017-11-04 DIAGNOSIS — I2782 Chronic pulmonary embolism: Secondary | ICD-10-CM

## 2017-11-04 DIAGNOSIS — I480 Paroxysmal atrial fibrillation: Secondary | ICD-10-CM

## 2017-11-04 DIAGNOSIS — E7800 Pure hypercholesterolemia, unspecified: Secondary | ICD-10-CM | POA: Diagnosis not present

## 2017-11-04 DIAGNOSIS — I82502 Chronic embolism and thrombosis of unspecified deep veins of left lower extremity: Secondary | ICD-10-CM

## 2017-11-04 DIAGNOSIS — G20 Parkinson's disease: Secondary | ICD-10-CM | POA: Diagnosis not present

## 2017-11-04 NOTE — Patient Instructions (Signed)

## 2017-11-18 ENCOUNTER — Other Ambulatory Visit: Payer: Self-pay | Admitting: Neurology

## 2017-11-25 ENCOUNTER — Other Ambulatory Visit: Payer: Self-pay | Admitting: Cardiovascular Disease

## 2017-12-07 NOTE — Progress Notes (Signed)
Thomas [name] was seen today in the movement disorders clinic for neurologic consultation at the request of [name], DO.  The consultation is for the evaluation of PD.  He is accompanied by his wife who supplements the history.  The patient has seen [name] at [facility] and I do have those notes.  The patient was dx with PD in Oct 2013 but first sx's probably started in the summer of 2013 with slowness of movement, R greater than L.  He does state that non-motor sx's like constipation occurred several years prior to this.  He was in [place] at the time.  He was initially started on Azilect in October 2013 and about 6 months later requip was added.  He was started initially at requip XL 2 mg and has worked up to requip XL 6 mg (the last increase was 10/2013).  He noticed a benefit with the requip but the XL is very expensive and asks about that.  He initially had some mental foginess with requip but that is better. Also asks about azilect as hasn't seen a benefit and is costing him $300.  Levodopa was then added by Dr. [name] in 03/2014.  It helped with cramping, rigidity.  He takes it at 4 am (but then goes back to sleep until 6am), 12pm, 8pm (bed at 8:30pm). He exercises faithfully.  He has neck turning to the right and has since the dx.  Some pain.     Specific Other Symptoms:  Tremor: Yes.  , only with pushing/pulling something or with stress Voice: weaker than in the past Sleep: sleeps well  Vivid Dreams:  Yes.    Acting out dreams:  Yes.   Wet Pillows: Yes.   (occasionally) Postural symptoms:  No. (unless gets up quick)  Falls?  No. Bradykinesia symptoms: slow movements; able to get up better now that on levodopa Loss of smell:  yes Loss of taste:  No. Urinary Incontinence: minimal - better with levodopa Difficulty Swallowing:  Yes.   but better now (would choke on saliva) Handwriting, micrographia: Yes.   (R hand dominant) Trouble with ADL's:  No. (shaving is easier now  that on levodopa; wife helps put on belt)  Trouble buttoning clothing: No. Depression:  No. Memory changes:  Yes.   but minor Hallucinations:  No.  visual distortions:rare N/V:  No. Lightheaded:  No.  Syncope: No. Diplopia:  No. Dyskinesia:  No.  Neuroimaging has previously been performed.  It is not available for my review today.  This was done in [place] prior to the diagnosis.  The patient does state that he had a TIA in 2007.  He was working in the yard on a hot day and he had just gone in and he sat down and felt a pain in the right side and tinnitus in the right ear and he had R face droop on the way to the hospital and R arm was "curling" on the way to the hospital.  He states that he was out of the window for TPA but was given IV heparin but was back to normal by 4 am (started at 5:30 pm but was at hospital by 6:30 pm so was unclear why out of TPA window).    10/26/14 update:  The patient returns today for follow-up, accompanied by his wife who supplements the history.  Last visit, changed him from Requip XL, 6 mg daily to ropinirole 2 mg 3 times per day, solely because of cost.  His Azilect was also discontinued because of cost.  His levodopa was increased to carbidopa/levodopa 25/100, 1-1/2 tablets 3 times per day.  I planned to order an MRI of the brain and cervical spine, but this was canceled by the patient because of cost.  Today, the patient states that he feels better overall and his gait is better; he no longer feels "foggy."  He is exercising.  He is doing cardio at [employer] and doing weights and cardio.  No falls.  Occ lightheadedness.  No syncope.  No hallucinations.  Very vivid dreams.  Is talking in sleep.  No hitting wife at night.  Not falling out bed.  "I feel better than I have in years."  02/27/15 update:  The patient is following up today regarding his Parkinson's disease.  He is currently on carbidopa/levodopa 25/100, 1-1/2 tablets 3 times per day in addition to  ropinirole, 2 mg 3 times per day (5-6am/11-12/and then q hs).  He had one fall on 12/30/14 since last visit.  He tripped over an uneven place in the sidewalk.  He states that he had rib pain but has recovered.  No syncope or lightheadedness.  No hallucinations.  Having very vivid dreams.  He will flail the arms and scream out but has not gotten hurt.  Wife states that it is actually a little better than in the past.  He started on melatonin and that helped his sleep.  He is noting stiffness in the neck and has trouble looking up.  He points to discomfort in the trapezius.  Late in the evening, he feels that his legs will "twitch" and he cannot sit still.  If he moves around, it will go away.    Asks me about changing his levitra to viagra.    06/27/15 update:  The patient is following up today regarding his Parkinson's disease.  He is currently on carbidopa/levodopa 25/100, 1-1/2 tablets 3 times per day in addition to ropinirole, 2 mg 3 times per day (5-6am/11-12/and then q hs).  States that he strained his back a few weeks ago (just moved wrong) and therefore and hasn't been as active.  Also has noted some tightness in his neck/trap and it has caused some nausea.  Doesn't think that it is at all related to wearing off of medication.  Still going to [employer] but not as vigorous with exercise.  No falls.  No lightheadedness.  Wife moved bedrooms but she can still sometimes hear him acting out the dream.    08/02/15 update:  The patient is following up today, much earlier than expected. This patient is accompanied in the office by his spouse who supplements the history. I have reviewed prior records made available to me.  He remains on Requip, 2 mg 3 times per day as well as carbidopa/levodopa 25/100, 1-1/2 tablets 3 times per day.  He presented to the emergency room on 07/03/2015 with community-acquired pneumonia and was treated with Levaquin.  He was taking an over-the-counter decongestant and NyQuil for his  symptoms.  He went back to the emergency room on 07/06/2015 with palpitations and it was determined that he was in atrial fibrillation and it was thought that it was due to the over-the-counter decongestant.  He spontaneously converted back to normal sinus rhythm in the ED.  The emergency room physician consulted with the cardiologist over the telephone and started him on amiodarone.  The patient was to follow-up with the cardiologist.  I do not have any  other cardiology records.  The patient states that he did a nuclear stress test and "passed that."  He is going to leave him on the amiodarone lifetime per the patient.   Pt states that since he was put on that he has been sluggish.  He is having some lightheadedness especially in the AM and it has affected his AM workout routine.  He feels that his PD has worsened and feels more tremulous.  He was going to get LSVT, but states that the therapy was very expensive for him.  11/26/15 update:  The patient follows up today, accompanied by his wife who supplements the history.  I have had the opportunity to review prior records made available to me.  He remains on ropinirole, 2 mgrams 3 times a day and last visit I increased his carbidopa/levodopa 25/100-2 tablets 3 times per day.  We had also requested a second opinion on whether he needed lifetime amiodarone, as that was making his Parkinson's worse.  He saw Dr. Stanford Breed who discontinued the amiodarone.  He has since followed up with Dr.Gollan since he is closer to his home in [place].  The patient is now just on metoprolol for rate control.  He is exercising 3 days a week.  No falls.  No hallucinations.  Talks in his sleep.  No neck pain issues.   03/27/16 update:  The patient follows up today, with his wife who supplements the history.  He is on ropinirole, 2 mg 3 times a day and carbidopa/levodopa 25/100, 2 tablets 3 times a day.  Noting some cognitive fogginess and wife noting some memory change as well,  especially if he is under pressure.  Cognitive change described as coming and going and just not feeling well.  Pt denies falls.  Pt denies lightheadedness, near syncope.  No hallucinations.  Mood has been good but occasionally feels "sorry for myself."  In regards to RBD, states that has been good and not acting out dreams.  Exercising at [employer] but having more trouble doing this than in the past and doesn't enjoy like he used to.  07/03/16 update:  Patient is following up today, accompanied by his wife who supplements the history.  Patient remains on carbidopa/levodopa 25/100, 2 tablets 3 times per day and ropinirole, 2 mg 3 times per day.  He has not had any hallucinations.  No sleep attacks.  No compulsive behaviors.  His metoprolol was reduced in half after we talked last visit and he states that he ended up having heart palpitations so he went back up on it.  Wife thinks that the motivation/fatigue is better. He is having some dyskinesia.  Wife notices it some.   He has had no falls.  11/25/16 update:  Patient seen today for his Parkinson's disease, accompanied by his wife who supplements the history.  Patient is on ropinirole, 2 mg 3 times per day and carbidopa/levodopa 25/100, 2 tablets 3 times per day (6am/12pm/5-6pm).  Pt denies falls.  Pt denies lightheadedness, near syncope.  No hallucinations.  Mood has been fair (not been able to exercise as much as he would like).  In regards to dyskinesia he states that he is doing about the same .  He had a plantar wart removed and so had trouble exercising but he is getting better.  No new medical problems.  He is losing weight through diet.    03/27/17 update: Patient seen today in follow-up for Parkinson's disease.  He is accompanied by his wife  who supplements the history.  Patient remains on ropinirole, 2 mg 3 times per day.  He has had no sleep attacks.  No compulsive behaviors.  He is also on carbidopa/levodopa 25/100, 2 tablets 3 times per day.  Pt  denies falls.  Pt denies lightheadedness, near syncope.  No hallucinations.  Mood has been good.  Going to [employer] 3 days a week and works out 2 other days a week.  Wife concerned about memory change.  Has some word finding trouble.  Never really forgets his medication.  Sometimes has to be reminded, but generally takes it on his own.  07/27/17 update: Patient is seen today in follow-up.  Patient is on carbidopa/levodopa 25/100, 2/2/2/1, which was increased last visit.  He is also on ropinirole, 2 mg 3 times per day.  He has had no compulsive behaviors or sleep attacks.  No lightheadedness or near syncope.  No hallucinations.  No falls.  He continues to exercise.   Wife sends in a list.  More dyskinesia.  Some depression.  Some short term memory.  Poor motivation.  Still exercising [employer] 2-3 days per week.  Shuffles some when walking short distances.   Records have been reviewed since our last visit.  He was seen by hematology for his history of DVT, for which he is on Xarelto.  He remains on that medication.  Wife has been in the hospital and just was d/c.  12/09/17 update: Patient is seen today in follow-up for Parkinson's disease.  He remains on carbidopa/levodopa 25/100, 2/2/2/1 and ropinirole 2 mg 3 times per day.  Wife states that "he really has the wiggles a lot and it is bothersome."  Wife concerned about balance.  Hasn't had fall but did have a vivid dream a few months ago and fell out of the bed.   He has had no compulsive behaviors.  No hallucinations.  No sleep attacks.  Is still very involved with rock steady boxing.  Records are reviewed since last visit.  Saw Dr. Rockey Situ on November 04, 2017.  No changes were made to his medication regimen.  PREVIOUS MEDICATIONS: Sinemet, Requip and azilect  ALLERGIES:   Allergies  Allergen Reactions  . Codeine Other (See Comments)    Reaction: Hallucinations    CURRENT MEDICATIONS:  Outpatient Encounter Medications as of 12/09/2017  Medication Sig  .  carbidopa-levodopa (SINEMET IR) 25-100 MG tablet TAKE 2 TABLETS AT 6 AM, 10 AM, AND 2 PM, AND 1 TABLET AT 6 PM AS DIRECTED.  Marland Kitchen cetirizine (ZYRTEC) 10 MG tablet Take 10 mg by mouth daily.  Marland Kitchen lisinopril (PRINIVIL,ZESTRIL) 30 MG tablet TAKE 1 TABLET EVERY DAY  . Melatonin 2.5 MG CAPS Take 1 capsule by mouth at bedtime as needed (for sleep).   . metoprolol succinate (TOPROL-XL) 25 MG 24 hr tablet Take 1 tablet (25 mg total) by mouth daily.  . rivaroxaban (XARELTO) 20 MG TABS tablet Take 1 tablet (20 mg total) by mouth daily.  Marland Kitchen rOPINIRole (REQUIP) 2 MG tablet TAKE 1 TABLET THREE TIMES DAILY  . sildenafil (REVATIO) 20 MG tablet Take 1 tablet (20 mg total) by mouth 3 (three) times daily as needed.  . simvastatin (ZOCOR) 40 MG tablet TAKE 1 TABLET EVERY DAY  . amantadine (SYMMETREL) 100 MG capsule Take 1 capsule (100 mg total) by mouth 3 (three) times daily.  . [DISCONTINUED] predniSONE (DELTASONE) 50 MG tablet Take 1 tablet (50 mg total) by mouth daily with breakfast.   No facility-administered encounter medications on file  as of 12/09/2017.     PAST MEDICAL HISTORY:   Past Medical History:  Diagnosis Date  . Allergy   . Arthritis    Osteoarthritis  . Arthritis of knee   . Atrial fibrillation ([place])   . DVT (deep venous thrombosis) ([facility])    L leg  . Hyperchloremia   . Hypercholesterolemia   . Hypertension   . Nephrolithiasis   . Parkinson's disease ([place])   . Plantar wart   . Pulmonary embolism ([place])   . Seasonal allergies   . TIA (transient ischemic attack)     PAST SURGICAL HISTORY:   Past Surgical History:  Procedure Laterality Date  . APPENDECTOMY    . KNEE ARTHROSCOPY WITH MENISCAL REPAIR Right 2009  . KNEE SURGERY Right   . T&A    . TONSILLECTOMY      SOCIAL HISTORY:   Social History   Socioeconomic History  . Marital status: Married    Spouse name: Not on file  . Number of children: Not on file  . Years of education: Not on file  . Highest education level: Not on  file  Occupational History  . Occupation: retired    Comment: Engineer, maintenance  . Financial resource strain: Not hard at all  . Food insecurity:    Worry: Never true    Inability: Never true  . Transportation needs:    Medical: No    Non-medical: No  Tobacco Use  . Smoking status: Never Smoker  . Smokeless tobacco: Never Used  Substance and Sexual Activity  . Alcohol use: No    Alcohol/week: 0.0 standard drinks  . Drug use: No  . Sexual activity: Not on file  Lifestyle  . Physical activity:    Days per week: 3 days    Minutes per session: 80 min  . Stress: Not at all  Relationships  . Social connections:    Talks on phone: Once a week    Gets together: More than three times a week    Attends religious service: More than 4 times per year    Active member of club or organization: Yes    Attends meetings of clubs or organizations: More than 4 times per year    Relationship status: Married  . Intimate partner violence:    Fear of current or ex partner: No    Emotionally abused: No    Physically abused: No    Forced sexual activity: No  Other Topics Concern  . Not on file  Social History Narrative   ** Merged History Encounter **        FAMILY HISTORY:   Family Status  Relation Name Status  . Father  Deceased       PD  . Mother  Deceased       CHF  . Sister  Deceased       CHF  . Sister  Deceased       breast cancer  . Sister  Alive       heart disease  . Sister  (Not Specified)  . Sister  (Not Specified)    ROS:  Review of Systems  Constitutional: Negative.   HENT: Negative.   Eyes: Negative.   Respiratory: Negative.   Cardiovascular: Negative.   Gastrointestinal: Negative.   Musculoskeletal: Negative.   Skin: Negative.     PHYSICAL EXAMINATION:    VITALS:   Vitals:   12/09/17 0849  BP: 122/64  Pulse: 64  SpO2: 98%  Weight: 250 lb (113.4 kg)  Height: 5\' 10"  (1.778 m)   Wt Readings from Last 3 Encounters:  12/09/17 250 lb (113.4  kg)  11/04/17 248 lb 8 oz (112.7 kg)  11/02/17 249 lb (112.9 kg)     GEN:  The patient appears stated age and is in NAD. HEENT:  Normocephalic, atraumatic.  The mucous membranes are moist. The superficial temporal arteries are without ropiness or tenderness. CV:  RRR Lungs:  CTAB Neck/HEME:  There are no carotid bruits bilaterally.  Neurological examination:  Orientation: The patient is alert and oriented x3. Cranial nerves: There is good facial symmetry. The speech is fluent and clear. Soft palate rises symmetrically and there is no tongue deviation. Hearing is intact to conversational tone. Sensation: Sensation is intact to light touch throughout Motor: Strength is 5/5 in the bilateral upper and lower extremities.   Shoulder shrug is equal and symmetric.  There is no pronator drift.  Movement examination: Tone: There is very mild increased tone in the right upper extremity (same as previous). Abnormal movements: There is mild axial and LUE dyskinesia.   Coordination:  There is decremation with toe taps/heel taps in the right leg.   Gait and Station: The patient has no difficulty today arising without the use of his hands.  The patient's stride length is stooped today.  He has decreased arm swing on the left.      Chemistry      Component Value Date/Time   NA 146 (H) 11/02/2017 1120   K 4.5 11/02/2017 1120   CL 110 (H) 11/02/2017 1120   CO2 22 11/02/2017 1120   BUN 19 11/02/2017 1120   CREATININE 0.97 11/02/2017 1120      Component Value Date/Time   CALCIUM 9.3 11/02/2017 1120   ALKPHOS 77 11/02/2017 1120   AST 8 11/02/2017 1120   ALT <5 11/02/2017 1120   BILITOT 0.4 11/02/2017 1120     Lab Results  Component Value Date   TSH 1.120 05/04/2017      ASSESSMENT/PLAN:  1.  Idiopathic Parkinson's disease, diagnosed in October 2013 in [place] with nonmotor symptoms that preceded this for several years.  Father had PD as well.    -continue ropinirole 2 mg 3 times.     -continue carbidopa/levodopa 25/100 2 tablets at 6am/ 2 tablets at 10am/ 2 tablets at 2pm/1 tablet at 6pm.  Risks, benefits, side effects and alternative therapies were discussed.  The opportunity to ask questions was given and they were answered to the best of my ability.  The patient expressed understanding and willingness to follow the outlined treatment protocols.  -talked about amantadine for dyskinesia.  Would like to try it.  Risks, benefits, side effects and alternative therapies were discussed.  The opportunity to ask questions was given and they were answered to the best of my ability.  The patient expressed understanding and willingness to follow the outlined treatment protocols.  -talked about LSVT BIG and LOUD but he doesn't think that he could go that much.  Will refer for PT/ST but not the LSVT program 2.  Constipation, associated with Parkinson's disease.  -Patient doing well with prunes, occasional miralax and has rancho recipe 3.  Hyperreflexia but neck pain better per patient  -very hyperreflexic but doesn't wish to pursue MRI cervical spine 4.  REM behavior disorder  -This is commonly associated with PD and the patient is experiencing this.  We discussed that this can be very serious and even harmful.  We  talked about medications as well as physical barriers to put in the bed (particularly soft bed rails, pillow barriers).  We talked about moving the night stand so that it is not so close to the side of the bed.  He doesn't want medication right now so encouraged him to use the bedrails.   5.  A-fib  -worse when on amiodarone but been in sinus for a long time.  -He tried to decrease metoprolol, but had more palpitations and went back up on it.  6. Memory change  -doubt PDD.  However, discussed neurocognitive testing in detail.  This may be important, because we could decrease the ropinirole.  He doesn't wish to do this right now.  Talked to him about reducing requip and ultimately  decided to hold.  I haven't seen a great decline in this regard.  He agrees 7.  Depression  -discussed medication.  Discussed that most patients with PD are on something for mood.  He wants to hold.  Will discuss with his wife 8.  Follow up is anticipated in the next few months, sooner should new neurologic issues arise.  Much greater than 50% of this visit was spent in counseling and coordinating care.  Total face to face time:  25 min

## 2017-12-09 ENCOUNTER — Encounter: Payer: Self-pay | Admitting: Neurology

## 2017-12-09 ENCOUNTER — Ambulatory Visit: Payer: Medicare HMO | Admitting: Neurology

## 2017-12-09 VITALS — BP 122/64 | HR 64 | Ht 70.0 in | Wt 250.0 lb

## 2017-12-09 DIAGNOSIS — G249 Dystonia, unspecified: Secondary | ICD-10-CM

## 2017-12-09 DIAGNOSIS — G20 Parkinson's disease: Secondary | ICD-10-CM | POA: Diagnosis not present

## 2017-12-09 MED ORDER — AMANTADINE HCL 100 MG PO CAPS: 100.0000 mg | ORAL_CAPSULE | Freq: Three times a day (TID) | ORAL | 1 refills | Status: DC

## 2017-12-09 NOTE — Patient Instructions (Addendum)
Start amantadine - 100mg  - one tablet three times per day.

## 2017-12-16 ENCOUNTER — Ambulatory Visit (INDEPENDENT_AMBULATORY_CARE_PROVIDER_SITE_OTHER): Payer: Medicare HMO

## 2017-12-16 DIAGNOSIS — Z23 Encounter for immunization: Secondary | ICD-10-CM | POA: Diagnosis not present

## 2018-01-12 ENCOUNTER — Ambulatory Visit: Payer: Medicare HMO | Admitting: Physical Therapy

## 2018-01-12 ENCOUNTER — Ambulatory Visit: Payer: Medicare HMO | Admitting: Speech Pathology

## 2018-01-14 ENCOUNTER — Encounter: Payer: Medicare HMO | Admitting: Speech Pathology

## 2018-01-19 ENCOUNTER — Encounter: Payer: Medicare HMO | Admitting: Speech Pathology

## 2018-01-21 ENCOUNTER — Encounter: Payer: Medicare HMO | Admitting: Speech Pathology

## 2018-01-26 ENCOUNTER — Encounter: Payer: Medicare HMO | Admitting: Speech Pathology

## 2018-01-28 ENCOUNTER — Encounter: Payer: Medicare HMO | Admitting: Speech Pathology

## 2018-02-02 ENCOUNTER — Encounter: Payer: Medicare HMO | Admitting: Speech Pathology

## 2018-02-04 ENCOUNTER — Encounter: Payer: Medicare HMO | Admitting: Speech Pathology

## 2018-02-04 ENCOUNTER — Other Ambulatory Visit: Payer: Self-pay | Admitting: Neurology

## 2018-02-09 ENCOUNTER — Encounter: Payer: Medicare HMO | Admitting: Speech Pathology

## 2018-02-09 ENCOUNTER — Other Ambulatory Visit: Payer: Self-pay | Admitting: Neurology

## 2018-02-09 MED ORDER — ROPINIROLE HCL 2 MG PO TABS: 2.0000 mg | ORAL_TABLET | Freq: Three times a day (TID) | ORAL | 1 refills | Status: DC

## 2018-02-09 MED ORDER — AMANTADINE HCL 100 MG PO CAPS: 100.0000 mg | ORAL_CAPSULE | Freq: Three times a day (TID) | ORAL | 1 refills | Status: DC

## 2018-02-11 ENCOUNTER — Encounter: Payer: Medicare HMO | Admitting: Speech Pathology

## 2018-02-16 ENCOUNTER — Encounter: Payer: Medicare HMO | Admitting: Speech Pathology

## 2018-02-18 ENCOUNTER — Encounter: Payer: Medicare HMO | Admitting: Speech Pathology

## 2018-02-23 ENCOUNTER — Encounter: Payer: Medicare HMO | Admitting: Speech Pathology

## 2018-02-25 ENCOUNTER — Encounter: Payer: Medicare HMO | Admitting: Speech Pathology

## 2018-03-15 ENCOUNTER — Other Ambulatory Visit: Payer: Self-pay | Admitting: Family Medicine

## 2018-03-16 NOTE — Telephone Encounter (Signed)
Requested Prescriptions  Pending Prescriptions Disp Refills  . lisinopril (PRINIVIL,ZESTRIL) 30 MG tablet [Pharmacy Med Name: LISINOPRIL 30 MG Tablet] 90 tablet 0    Sig: TAKE 1 TABLET EVERY DAY     Cardiovascular:  ACE Inhibitors Passed - 03/15/2018  8:32 AM      Passed - Cr in normal range and within 180 days    Creatinine, Ser  Date Value Ref Range Status  11/02/2017 0.97 0.76 - 1.27 mg/dL Final         Passed - K in normal range and within 180 days    Potassium  Date Value Ref Range Status  11/02/2017 4.5 3.5 - 5.2 mmol/L Final         Passed - Patient is not pregnant      Passed - Last BP in normal range    BP Readings from Last 1 Encounters:  12/09/17 122/64         Passed - Valid encounter within last 6 months    Recent Outpatient Visits          4 months ago Essential hypertension   [place], Megan P, DO   10 months ago Routine general medical examination at a health care facility   [place], [place], DO   1 year ago Essential hypertension   [place], [place], DO   1 year ago Medicare annual wellness visit, subsequent   [place], [place], DO   2 years ago Preop examination   [employer], Barb Merino, DO      Future Appointments            In 2 months  [employer], [place]   In 2 months Johnson, Megan P, [place], [place]

## 2018-04-22 NOTE — Progress Notes (Signed)
Thomas [name] was seen today in the movement disorders clinic for neurologic consultation at the request of [name], DO.  The consultation is for the evaluation of PD.  He is accompanied by his wife who supplements the history.  The patient has seen [name] at [facility] and I do have those notes.  The patient was dx with PD in Oct 2013 but first sx's probably started in the summer of 2013 with slowness of movement, R greater than L.  He does state that non-motor sx's like constipation occurred several years prior to this.  He was in [place] at the time.  He was initially started on Azilect in October 2013 and about 6 months later requip was added.  He was started initially at requip XL 2 mg and has worked up to requip XL 6 mg (the last increase was 10/2013).  He noticed a benefit with the requip but the XL is very expensive and asks about that.  He initially had some mental foginess with requip but that is better. Also asks about azilect as hasn't seen a benefit and is costing him $300.  Levodopa was then added by Dr. [name] in 03/2014.  It helped with cramping, rigidity.  He takes it at 4 am (but then goes back to sleep until 6am), 12pm, 8pm (bed at 8:30pm). He exercises faithfully.  He has neck turning to the right and has since the dx.  Some pain.     Specific Other Symptoms:  Tremor: Yes.  , only with pushing/pulling something or with stress Voice: weaker than in the past Sleep: sleeps well  Vivid Dreams:  Yes.    Acting out dreams:  Yes.   Wet Pillows: Yes.   (occasionally) Postural symptoms:  No. (unless gets up quick)  Falls?  No. Bradykinesia symptoms: slow movements; able to get up better now that on levodopa Loss of smell:  yes Loss of taste:  No. Urinary Incontinence: minimal - better with levodopa Difficulty Swallowing:  Yes.   but better now (would choke on saliva) Handwriting, micrographia: Yes.   (R hand dominant) Trouble with ADL's:  No. (shaving is easier now  that on levodopa; wife helps put on belt)  Trouble buttoning clothing: No. Depression:  No. Memory changes:  Yes.   but minor Hallucinations:  No.  visual distortions:rare N/V:  No. Lightheaded:  No.  Syncope: No. Diplopia:  No. Dyskinesia:  No.  Neuroimaging has previously been performed.  It is not available for my review today.  This was done in [place] prior to the diagnosis.  The patient does state that he had a TIA in 2007.  He was working in the yard on a hot day and he had just gone in and he sat down and felt a pain in the right side and tinnitus in the right ear and he had R face droop on the way to the hospital and R arm was "curling" on the way to the hospital.  He states that he was out of the window for TPA but was given IV heparin but was back to normal by 4 am (started at 5:30 pm but was at hospital by 6:30 pm so was unclear why out of TPA window).    10/26/14 update:  The patient returns today for follow-up, accompanied by his wife who supplements the history.  Last visit, changed him from Requip XL, 6 mg daily to ropinirole 2 mg 3 times per day, solely because of cost.  His Azilect was also discontinued because of cost.  His levodopa was increased to carbidopa/levodopa 25/100, 1-1/2 tablets 3 times per day.  I planned to order an MRI of the brain and cervical spine, but this was canceled by the patient because of cost.  Today, the patient states that he feels better overall and his gait is better; he no longer feels "foggy."  He is exercising.  He is doing cardio at [employer] and doing weights and cardio.  No falls.  Occ lightheadedness.  No syncope.  No hallucinations.  Very vivid dreams.  Is talking in sleep.  No hitting wife at night.  Not falling out bed.  "I feel better than I have in years."  02/27/15 update:  The patient is following up today regarding his Parkinson's disease.  He is currently on carbidopa/levodopa 25/100, 1-1/2 tablets 3 times per day in addition to  ropinirole, 2 mg 3 times per day (5-6am/11-12/and then q hs).  He had one fall on 12/30/14 since last visit.  He tripped over an uneven place in the sidewalk.  He states that he had rib pain but has recovered.  No syncope or lightheadedness.  No hallucinations.  Having very vivid dreams.  He will flail the arms and scream out but has not gotten hurt.  Wife states that it is actually a little better than in the past.  He started on melatonin and that helped his sleep.  He is noting stiffness in the neck and has trouble looking up.  He points to discomfort in the trapezius.  Late in the evening, he feels that his legs will "twitch" and he cannot sit still.  If he moves around, it will go away.    Asks me about changing his levitra to viagra.    06/27/15 update:  The patient is following up today regarding his Parkinson's disease.  He is currently on carbidopa/levodopa 25/100, 1-1/2 tablets 3 times per day in addition to ropinirole, 2 mg 3 times per day (5-6am/11-12/and then q hs).  States that he strained his back a few weeks ago (just moved wrong) and therefore and hasn't been as active.  Also has noted some tightness in his neck/trap and it has caused some nausea.  Doesn't think that it is at all related to wearing off of medication.  Still going to [employer] but not as vigorous with exercise.  No falls.  No lightheadedness.  Wife moved bedrooms but she can still sometimes hear him acting out the dream.    08/02/15 update:  The patient is following up today, much earlier than expected. This patient is accompanied in the office by his spouse who supplements the history. I have reviewed prior records made available to me.  He remains on Requip, 2 mg 3 times per day as well as carbidopa/levodopa 25/100, 1-1/2 tablets 3 times per day.  He presented to the emergency room on 07/03/2015 with community-acquired pneumonia and was treated with Levaquin.  He was taking an over-the-counter decongestant and NyQuil for his  symptoms.  He went back to the emergency room on 07/06/2015 with palpitations and it was determined that he was in atrial fibrillation and it was thought that it was due to the over-the-counter decongestant.  He spontaneously converted back to normal sinus rhythm in the ED.  The emergency room physician consulted with the cardiologist over the telephone and started him on amiodarone.  The patient was to follow-up with the cardiologist.  I do not have any  other cardiology records.  The patient states that he did a nuclear stress test and "passed that."  He is going to leave him on the amiodarone lifetime per the patient.   Pt states that since he was put on that he has been sluggish.  He is having some lightheadedness especially in the AM and it has affected his AM workout routine.  He feels that his PD has worsened and feels more tremulous.  He was going to get LSVT, but states that the therapy was very expensive for him.  11/26/15 update:  The patient follows up today, accompanied by his wife who supplements the history.  I have had the opportunity to review prior records made available to me.  He remains on ropinirole, 2 mgrams 3 times a day and last visit I increased his carbidopa/levodopa 25/100-2 tablets 3 times per day.  We had also requested a second opinion on whether he needed lifetime amiodarone, as that was making his Parkinson's worse.  He saw Dr. Stanford Breed who discontinued the amiodarone.  He has since followed up with Dr.Gollan since he is closer to his home in [place].  The patient is now just on metoprolol for rate control.  He is exercising 3 days a week.  No falls.  No hallucinations.  Talks in his sleep.  No neck pain issues.   03/27/16 update:  The patient follows up today, with his wife who supplements the history.  He is on ropinirole, 2 mg 3 times a day and carbidopa/levodopa 25/100, 2 tablets 3 times a day.  Noting some cognitive fogginess and wife noting some memory change as well,  especially if he is under pressure.  Cognitive change described as coming and going and just not feeling well.  Pt denies falls.  Pt denies lightheadedness, near syncope.  No hallucinations.  Mood has been good but occasionally feels "sorry for myself."  In regards to RBD, states that has been good and not acting out dreams.  Exercising at [employer] but having more trouble doing this than in the past and doesn't enjoy like he used to.  07/03/16 update:  Patient is following up today, accompanied by his wife who supplements the history.  Patient remains on carbidopa/levodopa 25/100, 2 tablets 3 times per day and ropinirole, 2 mg 3 times per day.  He has not had any hallucinations.  No sleep attacks.  No compulsive behaviors.  His metoprolol was reduced in half after we talked last visit and he states that he ended up having heart palpitations so he went back up on it.  Wife thinks that the motivation/fatigue is better. He is having some dyskinesia.  Wife notices it some.   He has had no falls.  11/25/16 update:  Patient seen today for his Parkinson's disease, accompanied by his wife who supplements the history.  Patient is on ropinirole, 2 mg 3 times per day and carbidopa/levodopa 25/100, 2 tablets 3 times per day (6am/12pm/5-6pm).  Pt denies falls.  Pt denies lightheadedness, near syncope.  No hallucinations.  Mood has been fair (not been able to exercise as much as he would like).  In regards to dyskinesia he states that he is doing about the same .  He had a plantar wart removed and so had trouble exercising but he is getting better.  No new medical problems.  He is losing weight through diet.    03/27/17 update: Patient seen today in follow-up for Parkinson's disease.  He is accompanied by his wife  who supplements the history.  Patient remains on ropinirole, 2 mg 3 times per day.  He has had no sleep attacks.  No compulsive behaviors.  He is also on carbidopa/levodopa 25/100, 2 tablets 3 times per day.  Pt  denies falls.  Pt denies lightheadedness, near syncope.  No hallucinations.  Mood has been good.  Going to [employer] 3 days a week and works out 2 other days a week.  Wife concerned about memory change.  Has some word finding trouble.  Never really forgets his medication.  Sometimes has to be reminded, but generally takes it on his own.  07/27/17 update: Patient is seen today in follow-up.  Patient is on carbidopa/levodopa 25/100, 2/2/2/1, which was increased last visit.  He is also on ropinirole, 2 mg 3 times per day.  He has had no compulsive behaviors or sleep attacks.  No lightheadedness or near syncope.  No hallucinations.  No falls.  He continues to exercise.   Wife sends in a list.  More dyskinesia.  Some depression.  Some short term memory.  Poor motivation.  Still exercising [employer] 2-3 days per week.  Shuffles some when walking short distances.   Records have been reviewed since our last visit.  He was seen by hematology for his history of DVT, for which he is on Xarelto.  He remains on that medication.  Wife has been in the hospital and just was d/c.  12/09/17 update: Patient is seen today in follow-up for Parkinson's disease.  He remains on carbidopa/levodopa 25/100, 2/2/2/1 and ropinirole 2 mg 3 times per day.  Wife states that "he really has the wiggles a lot and it is bothersome."  Wife concerned about balance.  Hasn't had fall but did have a vivid dream a few months ago and fell out of the bed.   He has had no compulsive behaviors.  No hallucinations.  No sleep attacks.  Is still very involved with rock steady boxing.  Records are reviewed since last visit.  Saw Dr. Rockey Situ on November 04, 2017.  No changes were made to his medication regimen.  04/23/18 update:  Pt seen in f/u for PD.  On carbidopa/levodopa 25/100, 2/2/2/1 and requip 2 mg tid.  Wife accompanies the patient and supplements the history.  No falls.  No EDS.  No hallucinations.  No sleep attacks.  In regards to mood, he states that "it hasn't been  too bad."  He thinks that amantadine has helped things.  States that dyskinesia is markedly better.  Doesn't feel like he is in a fog any longer.  Some tremor when holding a steering wheel.  Some trouble getting up from couch. More trouble getting back to sleep after using RR in middle of night.  Quit taking melatonin.  Wife thinks that memory has not been as good.  Having word finding troubles.  She got him a watch to help him remind him to take medication.  He does not particularly care for that as he does not think he has trouble taking medication and does not want to rely on something  PREVIOUS MEDICATIONS: Sinemet, Requip and azilect  ALLERGIES:   Allergies  Allergen Reactions  . Codeine Other (See Comments)    Reaction: Hallucinations    CURRENT MEDICATIONS:  Outpatient Encounter Medications as of 04/23/2018  Medication Sig  . amantadine (SYMMETREL) 100 MG capsule Take 1 capsule (100 mg total) by mouth 3 (three) times daily.  . carbidopa-levodopa (SINEMET IR) 25-100 MG tablet TAKE 2 TABLETS  AT 6 AM, 10 AM, AND 2 PM, AND 1 TABLET AT 6 PM AS DIRECTED.  Marland Kitchen cetirizine (ZYRTEC) 10 MG tablet Take 10 mg by mouth daily.  Marland Kitchen lisinopril (PRINIVIL,ZESTRIL) 30 MG tablet TAKE 1 TABLET EVERY DAY  . Melatonin 2.5 MG CAPS Take 1 capsule by mouth at bedtime as needed (for sleep).   . metoprolol succinate (TOPROL-XL) 25 MG 24 hr tablet Take 1 tablet (25 mg total) by mouth daily.  . rivaroxaban (XARELTO) 20 MG TABS tablet Take 1 tablet (20 mg total) by mouth daily.  Marland Kitchen rOPINIRole (REQUIP) 2 MG tablet Take 1 tablet (2 mg total) by mouth 3 (three) times daily.  . sildenafil (REVATIO) 20 MG tablet Take 1 tablet (20 mg total) by mouth 3 (three) times daily as needed.  . simvastatin (ZOCOR) 40 MG tablet TAKE 1 TABLET EVERY DAY   No facility-administered encounter medications on file as of 04/23/2018.     PAST MEDICAL HISTORY:   Past Medical History:  Diagnosis Date  . Allergy   . Arthritis     Osteoarthritis  . Arthritis of knee   . Atrial fibrillation ([place])   . DVT (deep venous thrombosis) ([facility])    L leg  . Hyperchloremia   . Hypercholesterolemia   . Hypertension   . Nephrolithiasis   . Parkinson's disease ([place])   . Plantar wart   . Pulmonary embolism ([place])   . Seasonal allergies   . TIA (transient ischemic attack)     PAST SURGICAL HISTORY:   Past Surgical History:  Procedure Laterality Date  . APPENDECTOMY    . KNEE ARTHROSCOPY WITH MENISCAL REPAIR Right 2009  . KNEE SURGERY Right   . T&A    . TONSILLECTOMY      SOCIAL HISTORY:   Social History   Socioeconomic History  . Marital status: Married    Spouse name: Not on file  . Number of children: Not on file  . Years of education: Not on file  . Highest education level: Not on file  Occupational History  . Occupation: retired    Comment: Engineer, maintenance  . Financial resource strain: Not hard at all  . Food insecurity:    Worry: Never true    Inability: Never true  . Transportation needs:    Medical: No    Non-medical: No  Tobacco Use  . Smoking status: Never Smoker  . Smokeless tobacco: Never Used  Substance and Sexual Activity  . Alcohol use: No    Alcohol/week: 0.0 standard drinks  . Drug use: No  . Sexual activity: Not on file  Lifestyle  . Physical activity:    Days per week: 3 days    Minutes per session: 80 min  . Stress: Not at all  Relationships  . Social connections:    Talks on phone: Once a week    Gets together: More than three times a week    Attends religious service: More than 4 times per year    Active member of club or organization: Yes    Attends meetings of clubs or organizations: More than 4 times per year    Relationship status: Married  . Intimate partner violence:    Fear of current or ex partner: No    Emotionally abused: No    Physically abused: No    Forced sexual activity: No  Other Topics Concern  . Not on file  Social History Narrative   **  Merged History Encounter **  FAMILY HISTORY:   Family Status  Relation Name Status  . Father  Deceased       PD  . Mother  Deceased       CHF  . Sister  Deceased       CHF  . Sister  Deceased       breast cancer  . Sister  Alive       heart disease  . Sister  (Not Specified)  . Sister  (Not Specified)    ROS:  Review of Systems  Constitutional: Negative.   HENT: Negative.   Eyes: Negative.   Respiratory: Negative.   Cardiovascular: Negative.   Gastrointestinal: Negative.   Genitourinary: Negative.   Musculoskeletal: Negative.   Skin: Negative.   Endo/Heme/Allergies: Negative.     PHYSICAL EXAMINATION:    VITALS:   Vitals:   04/23/18 1426  BP: 118/78  Pulse: 64  SpO2: 96%  Weight: 244 lb (110.7 kg)  Height: 5\' 10"  (1.778 m)   Wt Readings from Last 3 Encounters:  04/23/18 244 lb (110.7 kg)  12/09/17 250 lb (113.4 kg)  11/04/17 248 lb 8 oz (112.7 kg)     GEN:  The patient appears stated age and is in NAD. HEENT:  Normocephalic, atraumatic.  The mucous membranes are moist. The superficial temporal arteries are without ropiness or tenderness. CV:  RRR Lungs:  CTAB Neck/HEME:  There are no carotid bruits bilaterally.  Neurological examination:  Orientation: The patient is alert and oriented x3. Cranial nerves: There is good facial symmetry. The speech is fluent and clear. Soft palate rises symmetrically and there is no tongue deviation. Hearing is intact to conversational tone. Sensation: Sensation is intact to light touch throughout Motor: Strength is 5/5 in the bilateral upper and lower extremities.   Shoulder shrug is equal and symmetric.  There is no pronator drift.  Movement examination: Tone: There is slight increased in tone in the bilateral upper extremities Abnormal movements: There is no dyskinesia.   Coordination:  There is decremation with toe taps/heel taps in the bilateral lower extremities  Gait and Station: The patient has no  difficulty today arising without the use of his hands.  The patient's stride length is stooped today.  He has good arm swing and negative pull test today.    Chemistry      Component Value Date/Time   NA 146 (H) 11/02/2017 1120   K 4.5 11/02/2017 1120   CL 110 (H) 11/02/2017 1120   CO2 22 11/02/2017 1120   BUN 19 11/02/2017 1120   CREATININE 0.97 11/02/2017 1120      Component Value Date/Time   CALCIUM 9.3 11/02/2017 1120   ALKPHOS 77 11/02/2017 1120   AST 8 11/02/2017 1120   ALT <5 11/02/2017 1120   BILITOT 0.4 11/02/2017 1120     Lab Results  Component Value Date   TSH 1.120 05/04/2017      ASSESSMENT/PLAN:  1.  Idiopathic Parkinson's disease, diagnosed in October 2013 in [place] with nonmotor symptoms that preceded this for several years.  Father had PD as well.    -continue ropinirole 2 mg 3 times.  Long discussion about this medication today and how it can affect memory.  Ultimately, we decided to continue it.  I am not convinced that the patient has any dementia.  -continue carbidopa/levodopa 25/100 2 tablets at 6am/ 2 tablets at 10am/ 2 tablets at 2pm/1 tablet at 6pm.  Risks, benefits, side effects and alternative therapies were discussed.  The  opportunity to ask questions was given and they were answered to the best of my ability.  The patient expressed understanding and willingness to follow the outlined treatment protocols.  -Doing much better now that he is on amantadine, 100 mg 3 times per day for dyskinesia.  He actually thinks it helped clear him up cognitively.  -congratulated him on regular exercise 2.  Constipation, associated with Parkinson's disease.  -Doing well with MiraLAX 1 time every 3 weeks. 3.  Hyperreflexia but neck pain better per patient  -very hyperreflexic but doesn't wish to pursue MRI cervical spine 4.  REM behavior disorder  -This is commonly associated with PD and the patient is experiencing this.  We discussed that this can be very  serious and even harmful.  We talked about medications as well as physical barriers to put in the bed (particularly soft bed rails, pillow barriers).  We talked about moving the night stand so that it is not so close to the side of the bed.  He doesn't want medication right now so encouraged him to use the bedrails.   5.  A-fib  -worse when on amiodarone but been in sinus for a long time.  -He tried to decrease metoprolol, but had more palpitations and went back up on it.  6. Memory change  -I continue to doubt that the patient has Parkinson's dementia at this point in time.  We again discussed neurocognitive testing, which we have discussed the last several visits.  We also discussed that we currently have no neuropsychologist within the office, but he lives in [place] and we certainly could refer to [facility].  Ultimately, he declined and stated that he would think about it in the future. 7.  Depression  -He thinks he is doing better.  He does not think he is particularly depressed. 8. Follow up is anticipated in the next 4 months, sooner should new neurologic issues arise.  Much greater than 50% of this visit was spent in counseling and coordinating care.  Total face to face time:  25 min

## 2018-04-23 ENCOUNTER — Ambulatory Visit: Payer: Medicare HMO | Admitting: Neurology

## 2018-04-23 ENCOUNTER — Encounter: Payer: Self-pay | Admitting: Neurology

## 2018-04-23 VITALS — BP 118/78 | HR 64 | Ht 70.0 in | Wt 244.0 lb

## 2018-04-23 DIAGNOSIS — G20 Parkinson's disease: Secondary | ICD-10-CM | POA: Diagnosis not present

## 2018-04-23 DIAGNOSIS — R413 Other amnesia: Secondary | ICD-10-CM

## 2018-04-23 DIAGNOSIS — F330 Major depressive disorder, recurrent, mild: Secondary | ICD-10-CM | POA: Diagnosis not present

## 2018-04-23 DIAGNOSIS — G249 Dystonia, unspecified: Secondary | ICD-10-CM

## 2018-04-23 DIAGNOSIS — G4752 REM sleep behavior disorder: Secondary | ICD-10-CM

## 2018-04-23 NOTE — Patient Instructions (Signed)
Try to get back on your melatonin for sleep.    Good to see you!

## 2018-05-03 ENCOUNTER — Ambulatory Visit: Payer: Self-pay | Admitting: Family Medicine

## 2018-05-05 ENCOUNTER — Ambulatory Visit: Payer: Self-pay

## 2018-05-17 ENCOUNTER — Other Ambulatory Visit: Payer: Self-pay | Admitting: Family Medicine

## 2018-05-17 ENCOUNTER — Encounter: Payer: Medicare HMO | Admitting: Family Medicine

## 2018-05-17 ENCOUNTER — Ambulatory Visit: Payer: Medicare HMO

## 2018-05-26 ENCOUNTER — Other Ambulatory Visit: Payer: Self-pay | Admitting: Family Medicine

## 2018-06-03 ENCOUNTER — Encounter: Payer: Medicare HMO | Admitting: Family Medicine

## 2018-06-03 ENCOUNTER — Ambulatory Visit (INDEPENDENT_AMBULATORY_CARE_PROVIDER_SITE_OTHER): Payer: Medicare HMO

## 2018-06-03 VITALS — BP 134/82 | HR 53 | Temp 97.9°F | Resp 17 | Ht 70.0 in | Wt 246.0 lb

## 2018-06-03 DIAGNOSIS — Z0000 Encounter for general adult medical examination without abnormal findings: Secondary | ICD-10-CM | POA: Diagnosis not present

## 2018-06-03 NOTE — Patient Instructions (Addendum)
[name] , Thank you for taking time to come for your Medicare Wellness Visit. I appreciate your ongoing commitment to your health goals. Please review the following plan we discussed and let me know if I can assist you in the future.   Screening recommendations/referrals: Colonoscopy: cologuard completed 04/15/2016 Recommended yearly ophthalmology/optometry visit for glaucoma screening and checkup Recommended yearly dental visit for hygiene and checkup  Vaccinations: Influenza vaccine: up to date Pneumococcal vaccine: up to date Tdap vaccine: up to date Shingles vaccine: shingrix eligible, check with your insurance company for coverage     Advanced directives: copy on file  Conditions/risks identified: check your blood pressure when you get home and call with any high readings.   Next appointment: Follow up on 06/17/2018 at 9:00am with Dr.Johnson. Follow up in one year for your annual wellness exam.   Preventive Care 65 Years and Older, Male Preventive care refers to lifestyle choices and visits with your health care provider that can promote health and wellness. What does preventive care include?  A yearly physical exam. This is also called an annual well check.  Dental exams once or twice a year.  Routine eye exams. Ask your health care provider how often you should have your eyes checked.  Personal lifestyle choices, including:  Daily care of your teeth and gums.  Regular physical activity.  Eating a healthy diet.  Avoiding tobacco and drug use.  Limiting alcohol use.  Practicing safe sex.  Taking low doses of aspirin every day.  Taking vitamin and mineral supplements as recommended by your health care provider. What happens during an annual well check? The services and screenings done by your health care provider during your annual well check will depend on your age, overall health, lifestyle risk factors, and family history of disease. Counseling  Your health care  provider may ask you questions about your:  Alcohol use.  Tobacco use.  Drug use.  Emotional well-being.  Home and relationship well-being.  Sexual activity.  Eating habits.  History of falls.  Memory and ability to understand (cognition).  Work and work Statistician. Screening  You may have the following tests or measurements:  Height, weight, and BMI.  Blood pressure.  Lipid and cholesterol levels. These may be checked every 5 years, or more frequently if you are over 18 years old.  Skin check.  Lung cancer screening. You may have this screening every year starting at age 66 if you have a 30-pack-year history of smoking and currently smoke or have quit within the past 15 years.  Fecal occult blood test (FOBT) of the stool. You may have this test every year starting at age 24.  Flexible sigmoidoscopy or colonoscopy. You may have a sigmoidoscopy every 5 years or a colonoscopy every 10 years starting at age 64.  Prostate cancer screening. Recommendations will vary depending on your family history and other risks.  Hepatitis C blood test.  Hepatitis B blood test.  Sexually transmitted disease (STD) testing.  Diabetes screening. This is done by checking your blood sugar (glucose) after you have not eaten for a while (fasting). You may have this done every 1-3 years.  Abdominal aortic aneurysm (AAA) screening. You may need this if you are a current or former smoker.  Osteoporosis. You may be screened starting at age 16 if you are at high risk. Talk with your health care provider about your test results, treatment options, and if necessary, the need for more tests. Vaccines  Your health care  provider may recommend certain vaccines, such as:  Influenza vaccine. This is recommended every year.  Tetanus, diphtheria, and acellular pertussis (Tdap, Td) vaccine. You may need a Td booster every 10 years.  Zoster vaccine. You may need this after age 72.  Pneumococcal  13-valent conjugate (PCV13) vaccine. One dose is recommended after age 76.  Pneumococcal polysaccharide (PPSV23) vaccine. One dose is recommended after age 73. Talk to your health care provider about which screenings and vaccines you need and how often you need them. This information is not intended to replace advice given to you by your health care provider. Make sure you discuss any questions you have with your health care provider. Document Released: 04/27/2015 Document Revised: 12/19/2015 Document Reviewed: 01/30/2015 Elsevier Interactive Patient Education  2017 [place] Prevention in the Home Falls can cause injuries. They can happen to people of all ages. There are many things you can do to make your home safe and to help prevent falls. What can I do on the outside of my home?  Regularly fix the edges of walkways and driveways and fix any cracks.  Remove anything that might make you trip as you walk through a door, such as a raised step or threshold.  Trim any bushes or trees on the path to your home.  Use bright outdoor lighting.  Clear any walking paths of anything that might make someone trip, such as rocks or tools.  Regularly check to see if handrails are loose or broken. Make sure that both sides of any steps have handrails.  Any raised decks and porches should have guardrails on the edges.  Have any leaves, snow, or ice cleared regularly.  Use sand or salt on walking paths during winter.  Clean up any spills in your garage right away. This includes oil or grease spills. What can I do in the bathroom?  Use night lights.  Install grab bars by the toilet and in the tub and shower. Do not use towel bars as grab bars.  Use non-skid mats or decals in the tub or shower.  If you need to sit down in the shower, use a plastic, non-slip stool.  Keep the floor dry. Clean up any water that spills on the floor as soon as it happens.  Remove soap buildup in the  tub or shower regularly.  Attach bath mats securely with double-sided non-slip rug tape.  Do not have throw rugs and other things on the floor that can make you trip. What can I do in the bedroom?  Use night lights.  Make sure that you have a light by your bed that is easy to reach.  Do not use any sheets or blankets that are too big for your bed. They should not hang down onto the floor.  Have a firm chair that has side arms. You can use this for support while you get dressed.  Do not have throw rugs and other things on the floor that can make you trip. What can I do in the kitchen?  Clean up any spills right away.  Avoid walking on wet floors.  Keep items that you use a lot in easy-to-reach places.  If you need to reach something above you, use a strong step stool that has a grab bar.  Keep electrical cords out of the way.  Do not use floor polish or wax that makes floors slippery. If you must use wax, use non-skid floor wax.  Do not have throw  rugs and other things on the floor that can make you trip. What can I do with my stairs?  Do not leave any items on the stairs.  Make sure that there are handrails on both sides of the stairs and use them. Fix handrails that are broken or loose. Make sure that handrails are as long as the stairways.  Check any carpeting to make sure that it is firmly attached to the stairs. Fix any carpet that is loose or worn.  Avoid having throw rugs at the top or bottom of the stairs. If you do have throw rugs, attach them to the floor with carpet tape.  Make sure that you have a light switch at the top of the stairs and the bottom of the stairs. If you do not have them, ask someone to add them for you. What else can I do to help prevent falls?  Wear shoes that:  Do not have high heels.  Have rubber bottoms.  Are comfortable and fit you well.  Are closed at the toe. Do not wear sandals.  If you use a stepladder:  Make sure that it  is fully opened. Do not climb a closed stepladder.  Make sure that both sides of the stepladder are locked into place.  Ask someone to hold it for you, if possible.  Clearly mark and make sure that you can see:  Any grab bars or handrails.  First and last steps.  Where the edge of each step is.  Use tools that help you move around (mobility aids) if they are needed. These include:  Canes.  Walkers.  Scooters.  Crutches.  Turn on the lights when you go into a dark area. Replace any light bulbs as soon as they burn out.  Set up your furniture so you have a clear path. Avoid moving your furniture around.  If any of your floors are uneven, fix them.  If there are any pets around you, be aware of where they are.  Review your medicines with your doctor. Some medicines can make you feel dizzy. This can increase your chance of falling. Ask your doctor what other things that you can do to help prevent falls. This information is not intended to replace advice given to you by your health care provider. Make sure you discuss any questions you have with your health care provider. Document Released: 01/25/2009 Document Revised: 09/06/2015 Document Reviewed: 05/05/2014 Elsevier Interactive Patient Education  2017 [employer].

## 2018-06-03 NOTE — Progress Notes (Addendum)
Subjective:   Thomas [name] is a 68 y.o. male who presents for Medicare Annual/Subsequent preventive examination.  Review of Systems:   Cardiac Risk Factors include: advanced age (>37men, >2 women);hypertension;dyslipidemia;male gender;obesity (BMI >30kg/m2)     Objective:    Vitals: BP 134/82 (BP Location: Left Arm, Cuff Size: Normal)   Pulse (!) 53   Temp 97.9 F (36.6 C) (Oral)   Resp 17   Ht 5\' 10"  (1.778 m)   Wt 246 lb (111.6 kg)   BMI 35.30 kg/m   Body mass index is 35.3 kg/m.  Advanced Directives 06/03/2018 05/01/2017 04/27/2017 05/13/2016 03/28/2016 11/01/2015 07/06/2015  Does Patient Have a Medical Advance Directive? Yes Yes No Yes Yes Yes Yes  Type of Paramedic of [place];Living will [place];Living will - - [place] in Chart? Yes - validated most recent copy scanned in chart (See row information) No - copy requested - - No - copy requested - -  Would patient like information on creating a medical advance directive? - - - - - - -    Tobacco Social History   Tobacco Use  Smoking Status Never Smoker  Smokeless Tobacco Never Used     Counseling given: Not Answered   Clinical Intake:  Pre-visit preparation completed: Yes  Pain : No/denies pain     Nutritional Status: BMI > 30  Obese Nutritional Risks: None Diabetes: No  How often do you need to have someone help you when you read instructions, pamphlets, or other written materials from your doctor or pharmacy?: 1 - Never What is the last grade level you completed in school?: 2 years college   Interpreter Needed?: No  Information entered by :: Abbegale Stehle,LPN   Past Medical History:  Diagnosis Date  . Allergy   . Arthritis    Osteoarthritis  . Arthritis of knee   . Atrial fibrillation ([place])   . DVT (deep venous thrombosis) ([facility])    L leg  . Hyperchloremia   .  Hypercholesterolemia   . Hypertension   . Nephrolithiasis   . Parkinson's disease ([place])   . Plantar wart   . Pulmonary embolism ([place])   . Seasonal allergies   . TIA (transient ischemic attack)    Past Surgical History:  Procedure Laterality Date  . APPENDECTOMY    . KNEE ARTHROSCOPY WITH MENISCAL REPAIR Right 2009  . KNEE SURGERY Right   . T&A    . TONSILLECTOMY     Family History  Problem Relation Age of Onset  . Parkinson's disease Father   . Coronary artery disease Mother   . Heart attack Sister   . Deep vein thrombosis Sister    Social History   Socioeconomic History  . Marital status: Married    Spouse name: Not on file  . Number of children: Not on file  . Years of education: Not on file  . Highest education level: Some college, no degree  Occupational History  . Occupation: retired    Comment: Engineer, maintenance  . Financial resource strain: Not hard at all  . Food insecurity:    Worry: Never true    Inability: Never true  . Transportation needs:    Medical: No    Non-medical: No  Tobacco Use  . Smoking status: Never Smoker  . Smokeless tobacco: Never Used  Substance and Sexual Activity  . Alcohol use: No  Alcohol/week: 0.0 standard drinks  . Drug use: No  . Sexual activity: Not on file  Lifestyle  . Physical activity:    Days per week: 3 days    Minutes per session: 80 min  . Stress: Not at all  Relationships  . Social connections:    Talks on phone: Once a week    Gets together: More than three times a week    Attends religious service: More than 4 times per year    Active member of club or organization: Yes    Attends meetings of clubs or organizations: More than 4 times per year    Relationship status: Married  Other Topics Concern  . Not on file  Social History Narrative   ** Merged History Encounter **        Outpatient Encounter Medications as of 06/03/2018  Medication Sig  . amantadine (SYMMETREL) 100 MG capsule Take 1  capsule (100 mg total) by mouth 3 (three) times daily.  . carbidopa-levodopa (SINEMET IR) 25-100 MG tablet TAKE 2 TABLETS AT 6 AM, 10 AM, AND 2 PM, AND 1 TABLET AT 6 PM AS DIRECTED.  Marland Kitchen cetirizine (ZYRTEC) 10 MG tablet Take 10 mg by mouth daily.  Marland Kitchen lisinopril (PRINIVIL,ZESTRIL) 30 MG tablet TAKE 1 TABLET EVERY DAY  . Melatonin 2.5 MG CAPS Take 1 capsule by mouth at bedtime as needed (for sleep).   . metoprolol succinate (TOPROL-XL) 25 MG 24 hr tablet Take 1 tablet (25 mg total) by mouth daily.  Marland Kitchen PREVIDENT 5000 BOOSTER PLUS 1.1 % PSTE See admin instructions.  Marland Kitchen rOPINIRole (REQUIP) 2 MG tablet Take 1 tablet (2 mg total) by mouth 3 (three) times daily.  . sildenafil (REVATIO) 20 MG tablet Take 1 tablet (20 mg total) by mouth 3 (three) times daily as needed.  . simvastatin (ZOCOR) 40 MG tablet TAKE 1 TABLET EVERY DAY  . XARELTO 20 MG TABS tablet TAKE 1 TABLET EVERY DAY   No facility-administered encounter medications on file as of 06/03/2018.     Activities of Daily Living In your present state of health, do you have any difficulty performing the following activities: 06/03/2018  Hearing? N  Vision? Y  Comment in the evening blurry   Difficulty concentrating or making decisions? N  Walking or climbing stairs? N  Dressing or bathing? N  Doing errands, shopping? N  Preparing Food and eating ? N  Using the Toilet? N  In the past six months, have you accidently leaked urine? N  Do you have problems with loss of bowel control? N  Managing your Medications? N  Managing your Finances? N  Housekeeping or managing your Housekeeping? N  Some recent data might be hidden    Patient Care Team: [name], DO as PCP - General (Family Medicine) Tat, Eustace Quail, DO as Consulting Physician (Neurology) Odette Fraction (Optometry) Rockey Situ Kathlene November, MD as Consulting Physician (Cardiology) Samara Deist, DPM as Referring Physician (Podiatry)   Assessment:   This is a routine wellness  examination for Thomas [name].  Exercise Activities and Dietary recommendations Current Exercise Habits: Home exercise routine, Type of exercise: calisthenics;walking;strength training/weights(boxes), Time (Minutes): > 60, Frequency (Times/Week): 3, Weekly Exercise (Minutes/Week): 0, Intensity: Moderate, Exercise limited by: None identified  Goals    . DIET - INCREASE WATER INTAKE     Recommend drinking at least 6-8 glasses of water a day        Fall Risk Fall Risk  06/03/2018 04/23/2018 12/09/2017 07/27/2017 05/04/2017  Falls  in the past year? 0 0 No No No  Number falls in past yr: - 0 - - -  Injury with Fall? - 0 - - -  Risk for fall due to : - - - - Impaired balance/gait  Follow up - Falls evaluation completed - - -   FALL RISK PREVENTION PERTAINING TO THE HOME:  Any stairs in or around the home? Yes  If so, are there any without handrails? No   Home free of loose throw rugs in walkways, pet beds, electrical cords, etc? Yes  Adequate lighting in your home to reduce risk of falls? Yes   ASSISTIVE DEVICES UTILIZED TO PREVENT FALLS:  Life alert? No  Use of a cane, walker or w/c? No  Grab bars in the bathroom? No  Shower chair or bench in shower? No  Elevated toilet seat or a handicapped toilet? No   DME ORDERS:  DME order needed?  No   TIMED UP AND GO:  Was the test performed? Yes .  Length of time to ambulate 10 feet: 11 sec.   GAIT:  Appearance of gait: Gait stead-fast  without the use of an assistive device.  Education: Fall risk prevention has been discussed.  Intervention(s) required? No    Depression Screen PHQ 2/9 Scores 06/03/2018 05/04/2017 03/28/2016 08/23/2015  PHQ - 2 Score 0 2 0 0  PHQ- 9 Score - 4 3 -    Cognitive Function MMSE - Mini Mental State Exam 04/23/2018 07/27/2017 03/27/2017  Not completed: Unable to complete Unable to complete Unable to complete     6CIT Screen 06/03/2018 05/01/2017 03/28/2016  What Year? 0 points 0 points 0 points  What month?  0 points 0 points 0 points  What time? 0 points 0 points 0 points  Count back from 20 0 points 0 points 0 points  Months in reverse 0 points 0 points 0 points  Repeat phrase 0 points 0 points 2 points  Total Score 0 0 2    Immunization History  Administered Date(s) Administered  . Influenza, High Dose Seasonal PF 02/13/2017, 12/16/2017  . Influenza,inj,Quad PF,6+ Mos 02/05/2015  . Influenza-Unspecified 02/05/2015, 01/27/2016  . Pneumococcal Conjugate-13 06/18/2015  . Pneumococcal Polysaccharide-23 05/01/2017  . Tdap 02/16/2014  . Zoster 12/16/2014    Qualifies for Shingles Vaccine? Yes  Zostavax completed 12/16/2014. Due for Shingrix. Education has been provided regarding the importance of this vaccine. Pt has been advised to call insurance company to determine out of pocket expense. Advised may also receive vaccine at local pharmacy or Health Dept. Verbalized acceptance and understanding.  Tdap: up to date   Flu Vaccine: up to date   Pneumococcal Vaccine: up to date   Screening Tests Health Maintenance  Topic Date Due  . Fecal DNA (Cologuard)  04/16/2019  . TETANUS/TDAP  02/17/2024  . INFLUENZA VACCINE  Completed  . Hepatitis C Screening  Completed  . PNA vac Low Risk Adult  Completed   Cancer Screenings:  Colorectal Screening: cologuard Completed 04/15/2016. Repeat every 3 years  Lung Cancer Screening: (Low Dose CT Chest recommended if Age 49-80 years, 30 pack-year currently smoking OR have quit w/in 15years.) does not qualify.     Additional Screening:  Hepatitis C Screening: does qualify; Completed 08/23/2015  Vision Screening: Recommended annual ophthalmology exams for early detection of glaucoma and other disorders of the eye. Is the patient up to date with their annual eye exam?  Yes  Who is the provider or what is the name  of the office in which the pt attends annual eye exams? Dr.Thurmond   Dental Screening: Recommended annual dental exams for proper oral  hygiene  Community Resource Referral:  CRR required this visit?  No       Plan:    I have personally reviewed and addressed the Medicare Annual Wellness questionnaire and have noted the following in the patient's chart:  A. Medical and social history B. Use of alcohol, tobacco or illicit drugs  C. Current medications and supplements D. Functional ability and status E.  Nutritional status F.  Physical activity G. Advance directives H. List of other physicians I.  Hospitalizations, surgeries, and ER visits in previous 12 months J.  [employer] such as hearing and vision if needed, cognitive and depression L. Referrals and appointments   In addition, I have reviewed and discussed with patient certain preventive protocols, quality metrics, and best practice recommendations. A written personalized care plan for preventive services as well as general preventive health recommendations were provided to patient.   Signed,  Tyler Aas, LPN Nurse Health Advisor   Nurse Notes: Patient states he received a letter about his lisinopril being refilled from [facility] and that the request was denied. Was unable to find anything in patients chart that states it was denied by our office. Patient states he has enough to get him to his appointment on 06/17/2018 with Dr.Johnson.

## 2018-06-15 ENCOUNTER — Encounter: Payer: Medicare HMO | Admitting: Family Medicine

## 2018-06-15 DIAGNOSIS — B070 Plantar wart: Secondary | ICD-10-CM | POA: Diagnosis not present

## 2018-06-15 DIAGNOSIS — M79672 Pain in left foot: Secondary | ICD-10-CM | POA: Diagnosis not present

## 2018-06-16 ENCOUNTER — Telehealth: Payer: Self-pay | Admitting: Family Medicine

## 2018-06-16 NOTE — Telephone Encounter (Signed)
Copied from [place] [phone]. Topic: General - Other >> Jun 16, 2018  9:43 AM Keene Breath wrote: Reason for CRM: Patient called to ask if the doctor would prescribe 2 or 3 valium for him because he is having treatment on his feet next week and he will need something for the pain.  Please advise and call back if it is possible to get the script for the valium.  CB# [phone]

## 2018-06-16 NOTE — Telephone Encounter (Signed)
Will discuss at his appointment tomorrow.

## 2018-06-17 ENCOUNTER — Encounter: Payer: Self-pay | Admitting: Family Medicine

## 2018-06-17 ENCOUNTER — Ambulatory Visit (INDEPENDENT_AMBULATORY_CARE_PROVIDER_SITE_OTHER): Payer: Medicare HMO | Admitting: Family Medicine

## 2018-06-17 VITALS — BP 143/83 | HR 56 | Temp 97.6°F | Ht 70.0 in | Wt 243.1 lb

## 2018-06-17 DIAGNOSIS — I2782 Chronic pulmonary embolism: Secondary | ICD-10-CM

## 2018-06-17 DIAGNOSIS — Z125 Encounter for screening for malignant neoplasm of prostate: Secondary | ICD-10-CM

## 2018-06-17 DIAGNOSIS — F419 Anxiety disorder, unspecified: Secondary | ICD-10-CM

## 2018-06-17 DIAGNOSIS — I10 Essential (primary) hypertension: Secondary | ICD-10-CM

## 2018-06-17 DIAGNOSIS — I480 Paroxysmal atrial fibrillation: Secondary | ICD-10-CM | POA: Diagnosis not present

## 2018-06-17 DIAGNOSIS — Z0000 Encounter for general adult medical examination without abnormal findings: Secondary | ICD-10-CM | POA: Diagnosis not present

## 2018-06-17 DIAGNOSIS — E7800 Pure hypercholesterolemia, unspecified: Secondary | ICD-10-CM | POA: Diagnosis not present

## 2018-06-17 DIAGNOSIS — I82502 Chronic embolism and thrombosis of unspecified deep veins of left lower extremity: Secondary | ICD-10-CM

## 2018-06-17 DIAGNOSIS — G20 Parkinson's disease: Secondary | ICD-10-CM

## 2018-06-17 LAB — UA/M W/RFLX CULTURE, ROUTINE
BILIRUBIN UA: NEGATIVE
Glucose, UA: NEGATIVE
Leukocytes, UA: NEGATIVE
Nitrite, UA: NEGATIVE
Protein, UA: NEGATIVE
RBC UA: NEGATIVE
Specific Gravity, UA: 1.025 (ref 1.005–1.030)
Urobilinogen, Ur: 1 mg/dL (ref 0.2–1.0)
pH, UA: 6 (ref 5.0–7.5)

## 2018-06-17 LAB — MICROALBUMIN, URINE WAIVED
Creatinine, Urine Waived: 100 mg/dL (ref 10–300)
Microalb, Ur Waived: 30 mg/L — ABNORMAL HIGH (ref 0–19)

## 2018-06-17 MED ORDER — SILDENAFIL CITRATE 20 MG PO TABS: 20.0000 mg | ORAL_TABLET | Freq: Three times a day (TID) | ORAL | 6 refills | Status: DC | PRN

## 2018-06-17 MED ORDER — METOPROLOL SUCCINATE ER 25 MG PO TB24: 25.0000 mg | ORAL_TABLET | Freq: Every day | ORAL | 11 refills | Status: DC

## 2018-06-17 MED ORDER — SIMVASTATIN 40 MG PO TABS: 40.0000 mg | ORAL_TABLET | Freq: Every day | ORAL | 1 refills | Status: DC

## 2018-06-17 MED ORDER — LISINOPRIL 30 MG PO TABS: 30.0000 mg | ORAL_TABLET | Freq: Every day | ORAL | 1 refills | Status: DC

## 2018-06-17 MED ORDER — ALPRAZOLAM 0.5 MG PO TABS: ORAL_TABLET | ORAL | 0 refills | Status: DC

## 2018-06-17 NOTE — Assessment & Plan Note (Signed)
Continues on xarelto. Follow up with hematology as needed. Call with any concerns.

## 2018-06-17 NOTE — Assessment & Plan Note (Signed)
Continues to work with neurology. Call with any concerns. Continue to monitor.

## 2018-06-17 NOTE — Assessment & Plan Note (Signed)
Under good control on current regimen. Continue current regimen. Continue to monitor. Call with any concerns. Refills given. Labs drawn today.   

## 2018-06-17 NOTE — Progress Notes (Signed)
BP (!) 143/83 (BP Location: Left Arm, Patient Position: Sitting, Cuff Size: Normal)  Pulse (!) 56  Temp 97.6 F (36.4 C) (Oral)  Ht 5\' 10"  (1.778 m)  Wt 243 lb 1.6 oz (110.3 kg)  SpO2 96%  BMI 34.88 kg/m   Subjective:  Subjective  Patient ID: Thomas [name], male DOB: Apr 27, 1950, 68 y.o. MRN: [id number]   HPI:   Thomas [name] is a 68 y.o. male presenting on 06/17/2018 for comprehensive medical examination. Current medical complaints include:   Has to have injections for plantar warts and has been very anxious getting those done. Would like to have something to help with anxiety about that.   HYPERTENSION / HYPERLIPIDEMIA  Satisfied with current treatment? yes  Duration of hypertension: chronic  BP monitoring frequency: not checking  BP medication side effects: no  Past BP meds: metoprolol, lisinopril  Duration of hyperlipidemia: chronic  Cholesterol medication side effects: no  Cholesterol supplements: none  Past cholesterol medications: simvastatin  Medication compliance: excellent compliance  Aspirin: no  Recent stressors: no  Recurrent headaches: no  Visual changes: no  Palpitations: no  Dyspnea: no  Chest pain: no  Lower extremity edema: no  Dizzy/lightheaded: no  He currently lives with: wife  Interim Problems from his last visit: no  Depression Screen done today and results listed below:   Depression screen [facility] 2/9 06/03/2018 05/04/2017 03/28/2016 08/23/2015  Decreased Interest 0 1 0 0  Down, Depressed, Hopeless 0 1 0 0  PHQ - 2 Score 0 2 0 0  Altered sleeping - 0 1 -  Tired, decreased energy - 1 0 -  Change in appetite - 0 0 -  Feeling bad or failure about yourself  - 0 1 -  Trouble concentrating - 1 0 -  Moving slowly or fidgety/restless - 0 1 -  Suicidal thoughts - 0 0 -  PHQ-9 Score - 4 3 -  Difficult doing work/chores - Not difficult at all - -   Past Medical History:      Past Medical History:  Diagnosis Date  . Allergy   . Arthritis    Osteoarthritis  . Arthritis of knee   . Atrial fibrillation ([place])   . DVT (deep venous thrombosis) ([facility])    L leg  . Hyperchloremia   . Hypercholesterolemia   . Hypertension   . Nephrolithiasis   . Parkinson's disease ([place])   . Plantar wart   . Pulmonary embolism ([place])   . Seasonal allergies   . TIA (transient ischemic attack)    Surgical History:       Past Surgical History:  Procedure Laterality Date  . APPENDECTOMY    . KNEE ARTHROSCOPY WITH MENISCAL REPAIR Right 2009  . KNEE SURGERY Right   . T&A    . TONSILLECTOMY     Medications:      Current Outpatient Medications on File Prior to Visit  Medication Sig  . amantadine (SYMMETREL) 100 MG capsule Take 1 capsule (100 mg total) by mouth 3 (three) times daily.  . carbidopa-levodopa (SINEMET IR) 25-100 MG tablet TAKE 2 TABLETS AT 6 AM, 10 AM, AND 2 PM, AND 1 TABLET AT 6 PM AS DIRECTED.  Marland Kitchen cetirizine (ZYRTEC) 10 MG tablet Take 10 mg by mouth daily.  Marland Kitchen lisinopril (PRINIVIL,ZESTRIL) 30 MG tablet TAKE 1 TABLET EVERY DAY  . Melatonin 2.5 MG CAPS Take 1 capsule by mouth at bedtime as needed (for sleep).   . metoprolol succinate (TOPROL-XL) 25 MG 24 hr tablet Take  1 tablet (25 mg total) by mouth daily.  Marland Kitchen PREVIDENT 5000 BOOSTER PLUS 1.1 % PSTE See admin instructions.  Marland Kitchen rOPINIRole (REQUIP) 2 MG tablet Take 1 tablet (2 mg total) by mouth 3 (three) times daily.  . sildenafil (REVATIO) 20 MG tablet Take 1 tablet (20 mg total) by mouth 3 (three) times daily as needed.  . simvastatin (ZOCOR) 40 MG tablet TAKE 1 TABLET EVERY DAY  . XARELTO 20 MG TABS tablet TAKE 1 TABLET EVERY DAY   No current facility-administered medications on file prior to visit.    Allergies:       Allergies  Allergen Reactions  . Codeine Other (See Comments)    Reaction: Hallucinations   Social History:  Social History        Socioeconomic History  . Marital status: Married    Spouse name: Not on file  . Number of children: Not on file  . Years of  education: Not on file  . Highest education level: Some college, no degree  Occupational History  . Occupation: retired    Comment: Engineer, maintenance  . Financial resource strain: Not hard at all  . Food insecurity:    Worry: Never true    Inability: Never true  . Transportation needs:    Medical: No    Non-medical: No  Tobacco Use  . Smoking status: Never Smoker  . Smokeless tobacco: Never Used  Substance and Sexual Activity  . Alcohol use: No    Alcohol/week: 0.0 standard drinks  . Drug use: No  . Sexual activity: Not on file  Lifestyle  . Physical activity:    Days per week: 3 days    Minutes per session: 80 min  . Stress: Not at all  Relationships  . Social connections:    Talks on phone: Once a week    Gets together: More than three times a week    Attends religious service: More than 4 times per year    Active member of club or organization: Yes    Attends meetings of clubs or organizations: More than 4 times per year    Relationship status: Married  . Intimate partner violence:    Fear of current or ex partner: No    Emotionally abused: No    Physically abused: No    Forced sexual activity: No  Other Topics Concern  . Not on file  Social History Narrative   ** Merged History Encounter **      Social History      Tobacco Use  Smoking Status Never Smoker  Smokeless Tobacco Never Used   Social History       Substance and Sexual Activity  Alcohol Use No  . Alcohol/week: 0.0 standard drinks   Family History:       Family History  Problem Relation Age of Onset  . Parkinson's disease Father   . Coronary artery disease Mother   . Heart attack Sister   . Deep vein thrombosis Sister    Past medical history, surgical history, medications, allergies, family history and social history reviewed with patient today and changes made to appropriate areas of the chart.  Review of Systems  Constitutional: Negative.  HENT: Negative.  Runny nose  Eyes:  Negative.  Respiratory: Negative.  Cardiovascular: Negative.  Gastrointestinal: Positive for constipation and heartburn (with food choices). Negative for abdominal pain, blood in stool, diarrhea, melena, nausea and vomiting.  Genitourinary: Negative.  Musculoskeletal: Negative.  Skin: Negative.  Neurological: Positive  for weakness. Negative for dizziness, tingling, tremors, sensory change, speech change, focal weakness, seizures, loss of consciousness and headaches.  Endo/Heme/Allergies: Negative.  Psychiatric/Behavioral: Negative.   All other ROS negative except what is listed above and in the HPI.    Objective:  Objective  BP (!) 143/83 (BP Location: Left Arm, Patient Position: Sitting, Cuff Size: Normal)  Pulse (!) 56  Temp 97.6 F (36.4 C) (Oral)  Ht 5\' 10"  (1.778 m)  Wt 243 lb 1.6 oz (110.3 kg)  SpO2 96%  BMI 34.88 kg/m     Wt Readings from Last 3 Encounters:  06/17/18 243 lb 1.6 oz (110.3 kg)  06/03/18 246 lb (111.6 kg)  04/23/18 244 lb (110.7 kg)   Physical Exam  Vitals signs and nursing note reviewed.  Constitutional:  General: He is not in acute distress. Appearance: Normal appearance. He is obese. He is not ill-appearing, toxic-appearing or diaphoretic.  HENT:  Head: Normocephalic and atraumatic.  Right Ear: Tympanic membrane, ear canal and external ear normal. There is no impacted cerumen.  Left Ear: Tympanic membrane, ear canal and external ear normal. There is no impacted cerumen.  Nose: Nose normal. No congestion or rhinorrhea.  Mouth/Throat:  Mouth: Mucous membranes are moist.  Pharynx: Oropharynx is clear. No oropharyngeal exudate or posterior oropharyngeal erythema.  Eyes:  General: No scleral icterus.  Right eye: No discharge.  Left eye: No discharge.  Extraocular Movements: Extraocular movements intact.  Conjunctiva/sclera: Conjunctivae normal.  Pupils: Pupils are equal, round, and reactive to light.  Neck:  Musculoskeletal: Normal range of  motion and neck supple. No neck rigidity or muscular tenderness.  Vascular: No carotid bruit.  Cardiovascular:  Rate and Rhythm: Normal rate and regular rhythm.  Pulses: Normal pulses.  Heart sounds: No murmur. No friction rub. No gallop.  Pulmonary:  Effort: Pulmonary effort is normal. No respiratory distress.  Breath sounds: Normal breath sounds. No stridor. No wheezing, rhonchi or rales.  Chest:  Chest wall: No tenderness.  Abdominal:  General: Abdomen is flat. Bowel sounds are normal. There is no distension.  Palpations: Abdomen is soft. There is no mass.  Tenderness: There is no abdominal tenderness. There is no right CVA tenderness, left CVA tenderness, guarding or rebound.  Hernia: No hernia is present.  Genitourinary:  Comments: Genital exam deferred with shared decision making Musculoskeletal:  General: No swelling, tenderness, deformity or signs of injury.  Right lower leg: No edema.  Left lower leg: No edema.  Lymphadenopathy:  Cervical: No cervical adenopathy.  Skin:  General: Skin is warm and dry.  Capillary Refill: Capillary refill takes less than 2 seconds.  Coloration: Skin is not jaundiced or pale.  Findings: No bruising, erythema, lesion or rash.  Neurological:  General: No focal deficit present.  Mental Status: He is alert and oriented to person, place, and time.  Cranial Nerves: No cranial nerve deficit.  Sensory: No sensory deficit.  Motor: No weakness.  Coordination: Coordination normal.  Gait: Gait normal.  Deep Tendon Reflexes: Reflexes normal.  Psychiatric:  Mood and Affect: Mood normal.  Behavior: Behavior normal.  Thought Content: Thought content normal.  Judgment: Judgment normal.        Results for orders placed or performed in visit on 11/02/17  CBC with Differential/Platelet  Result Value Ref Range   WBC 4.7 3.4 - 10.8 x10E3/uL   RBC 4.46 4.14 - 5.80 x10E6/uL   Hemoglobin 13.7 13.0 - 17.7 g/dL   Hematocrit 40.4 37.5 - 51.0 %   MCV 91  79 - 97 fL   MCH 30.7 26.6 - 33.0 pg   MCHC 33.9 31.5 - 35.7 g/dL   RDW 13.4 12.3 - 15.4 %   Platelets 166 150 - 450 x10E3/uL   Neutrophils 67 Not Estab. %   Lymphs 22 Not Estab. %   Monocytes 6 Not Estab. %   Eos 4 Not Estab. %   Basos 1 Not Estab. %   Neutrophils Absolute 3.2 1.4 - 7.0 x10E3/uL   Lymphocytes Absolute 1.0 0.7 - 3.1 x10E3/uL   Monocytes Absolute 0.3 0.1 - 0.9 x10E3/uL   EOS (ABSOLUTE) 0.2 0.0 - 0.4 x10E3/uL   Basophils Absolute 0.0 0.0 - 0.2 x10E3/uL   Immature Granulocytes 0 Not Estab. %   Immature Grans (Abs) 0.0 0.0 - 0.1 x10E3/uL  Comprehensive metabolic panel  Result Value Ref Range   Glucose 101 (H) 65 - 99 mg/dL   BUN 19 8 - 27 mg/dL   Creatinine, Ser 0.97 0.76 - 1.27 mg/dL   GFR calc non Af Amer 80 >59 mL/min/1.73   GFR calc Af Amer 92 >59 mL/min/1.73   BUN/Creatinine Ratio 20 10 - 24   Sodium 146 (H) 134 - 144 mmol/L   Potassium 4.5 3.5 - 5.2 mmol/L   Chloride 110 (H) 96 - 106 mmol/L   CO2 22 20 - 29 mmol/L   Calcium 9.3 8.6 - 10.2 mg/dL   Total Protein 6.6 6.0 - 8.5 g/dL   Albumin 4.2 3.6 - 4.8 g/dL   Globulin, Total 2.4 1.5 - 4.5 g/dL   Albumin/Globulin Ratio 1.8 1.2 - 2.2   Bilirubin Total 0.4 0.0 - 1.2 mg/dL   Alkaline Phosphatase 77 39 - 117 IU/L   AST 8 0 - 40 IU/L   ALT <5 0 - 44 IU/L  Lipid Panel w/o Chol/HDL Ratio  Result Value Ref Range   Cholesterol, Total 118 100 - 199 mg/dL   Triglycerides 58 0 - 149 mg/dL   HDL 41 >39 mg/dL   VLDL Cholesterol Cal 12 5 - 40 mg/dL   LDL Calculated 65 0 - 99 mg/dL   Assessment & Plan:   Problem List Items Addressed This Visit      Cardiovascular and Mediastinum   Hypertension    Under good control on current regimen. Continue current regimen. Continue to monitor. Call with any concerns. Refills given. Labs drawn today.       Relevant Medications   lisinopril (PRINIVIL,ZESTRIL) 30 MG tablet   metoprolol succinate (TOPROL-XL) 25 MG 24 hr tablet   sildenafil (REVATIO) 20 MG tablet    simvastatin (ZOCOR) 40 MG tablet   Other Relevant Orders   CBC with Differential/Platelet   Comprehensive metabolic panel   Microalbumin, Urine Waived   TSH   UA/M w/rflx Culture, Routine   DVT (deep venous thrombosis) ([place])    Continues on xarelto. Follow up with hematology as needed. Call with any concerns.       Relevant Medications   lisinopril (PRINIVIL,ZESTRIL) 30 MG tablet   metoprolol succinate (TOPROL-XL) 25 MG 24 hr tablet   sildenafil (REVATIO) 20 MG tablet   simvastatin (ZOCOR) 40 MG tablet   Other Relevant Orders   CBC with Differential/Platelet   Comprehensive metabolic panel   TSH   UA/M w/rflx Culture, Routine   Pulmonary embolism ([place])    Continues on xarelto. Follow up with hematology as needed. Call with any concerns.       Relevant Medications   lisinopril (PRINIVIL,ZESTRIL) 30 MG tablet  metoprolol succinate (TOPROL-XL) 25 MG 24 hr tablet   sildenafil (REVATIO) 20 MG tablet   simvastatin (ZOCOR) 40 MG tablet   Other Relevant Orders   CBC with Differential/Platelet   Comprehensive metabolic panel   TSH   UA/M w/rflx Culture, Routine   Atrial fibrillation ([facility])    Stable. Continue to follow with cardiology. Call with any concerns.       Relevant Medications   lisinopril (PRINIVIL,ZESTRIL) 30 MG tablet   metoprolol succinate (TOPROL-XL) 25 MG 24 hr tablet   sildenafil (REVATIO) 20 MG tablet   simvastatin (ZOCOR) 40 MG tablet   Other Relevant Orders   CBC with Differential/Platelet   Comprehensive metabolic panel   TSH   UA/M w/rflx Culture, Routine     Nervous and Auditory   Parkinson's disease ([place])    Continues to work with neurology. Call with any concerns. Continue to monitor.       Relevant Orders   CBC with Differential/Platelet   Comprehensive metabolic panel   TSH   UA/M w/rflx Culture, Routine     Other   Hypercholesterolemia    Under good control on current regimen. Continue current regimen. Continue to monitor. Call with any  concerns. Refills given. Labs drawn today.       Relevant Medications   lisinopril (PRINIVIL,ZESTRIL) 30 MG tablet   metoprolol succinate (TOPROL-XL) 25 MG 24 hr tablet   sildenafil (REVATIO) 20 MG tablet   simvastatin (ZOCOR) 40 MG tablet   Other Relevant Orders   CBC with Differential/Platelet   Comprehensive metabolic panel   Lipid Panel w/o Chol/HDL Ratio   TSH   UA/M w/rflx Culture, Routine    Other Visit Diagnoses    Routine general medical examination at a health care facility    -  Primary   Vaccines up to date. Screening labs checked today. Cologuard up to date. Continue diet and exercise. Call with any concerns.    Screening for prostate cancer       Labs drawn today.   Relevant Orders   PSA   Acute anxiety       Due to plantar wart treatment. Will treat with xanax for prior to his treatment. Call with any concerns.    Relevant Medications   ALPRAZolam (XANAX) 0.5 MG tablet       LABORATORY TESTING:   Health maintenance labs ordered today as discussed above.   The natural history of prostate cancer and ongoing controversy regarding screening and potential treatment outcomes of prostate cancer has been discussed with the patient. The meaning of a false positive PSA and a false negative PSA has been discussed. He indicates understanding of the limitations of this screening test and wishes to proceed with screening PSA testing.   IMMUNIZATIONS:  - Tdap: Tetanus vaccination status reviewed: last tetanus booster within 10 years.  - Influenza: Up to date  - Pneumovax: Up to date  - Prevnar: Up to date  - HPV: Not applicable  - Zostavax vaccine: Up to date   SCREENING:  - Colonoscopy: Up to date  Discussed with patient purpose of the colonoscopy is to detect colon cancer at curable precancerous or early stages   PATIENT COUNSELING:  Sexuality: Discussed sexually transmitted diseases, partner selection, use of condoms, avoidance of unintended pregnancy and  contraceptive alternatives.  Advised to avoid cigarette smoking.  I discussed with the patient that most people either abstain from alcohol or drink within safe limits (<=14/week and <=4 drinks/occasion for males, <=7/weeks and <=  3 drinks/occasion for females) and that the risk for alcohol disorders and other health effects rises proportionally with the number of drinks per week and how often a drinker exceeds daily limits.  Discussed cessation/primary prevention of drug use and availability of treatment for abuse.  Diet: Encouraged to adjust caloric intake to maintain or achieve ideal body weight, to reduce intake of dietary saturated fat and total fat, to limit sodium intake by avoiding high sodium foods and not adding table salt, and to maintain adequate dietary potassium and calcium preferably from fresh fruits, vegetables, and low-fat dairy products.  stressed the importance of regular exercise  Injury prevention: Discussed safety belts, safety helmets, smoke detector, smoking near bedding or upholstery.  Dental health: Discussed importance of regular tooth brushing, flossing, and dental visits.   Follow up plan:  NEXT PREVENTATIVE PHYSICAL DUE IN 1 YEAR.   No follow-ups on file.

## 2018-06-17 NOTE — Assessment & Plan Note (Signed)
Stable. Continue to follow with cardiology. Call with any concerns.  

## 2018-06-17 NOTE — Patient Instructions (Signed)
Health Maintenance, Male A healthy lifestyle and preventive care is important for your health and wellness. Ask your health care provider about what schedule of regular examinations is right for you. What should I know about weight and diet? Eat a Healthy Diet  Eat plenty of vegetables, fruits, whole grains, low-fat dairy products, and lean protein.  Do not eat a lot of foods high in solid fats, added sugars, or salt.  Maintain a Healthy Weight Regular exercise can help you achieve or maintain a healthy weight. You should:  Do at least 150 minutes of exercise each week. The exercise should increase your heart rate and make you sweat (moderate-intensity exercise).  Do strength-training exercises at least twice a week. Watch Your Levels of Cholesterol and Blood Lipids  Have your blood tested for lipids and cholesterol every 5 years starting at 69 years of age. If you are at high risk for heart disease, you should start having your blood tested when you are 69 years old. You may need to have your cholesterol levels checked more often if: ? Your lipid or cholesterol levels are high. ? You are older than 69 years of age. ? You are at high risk for heart disease. What should I know about cancer screening? Many types of cancers can be detected early and may often be prevented. Lung Cancer  You should be screened every year for lung cancer if: ? You are a current smoker who has smoked for at least 30 years. ? You are a former smoker who has quit within the past 15 years.  Talk to your health care provider about your screening options, when you should start screening, and how often you should be screened. Colorectal Cancer  Routine colorectal cancer screening usually begins at 69 years of age and should be repeated every 5-10 years until you are 69 years old. You may need to be screened more often if early forms of precancerous polyps or small growths are found. Your health care provider may  recommend screening at an earlier age if you have risk factors for colon cancer.  Your health care provider may recommend using home test kits to check for hidden blood in the stool.  A small camera at the end of a tube can be used to examine your colon (sigmoidoscopy or colonoscopy). This checks for the earliest forms of colorectal cancer. Prostate and Testicular Cancer  Depending on your age and overall health, your health care provider may do certain tests to screen for prostate and testicular cancer.  Talk to your health care provider about any symptoms or concerns you have about testicular or prostate cancer. Skin Cancer  Check your skin from head to toe regularly.  Tell your health care provider about any new moles or changes in moles, especially if: ? There is a change in a mole's size, shape, or color. ? You have a mole that is larger than a pencil eraser.  Always use sunscreen. Apply sunscreen liberally and repeat throughout the day.  Protect yourself by wearing long sleeves, pants, a wide-brimmed hat, and sunglasses when outside. What should I know about heart disease, diabetes, and high blood pressure?  If you are 18-39 years of age, have your blood pressure checked every 3-5 years. If you are 40 years of age or older, have your blood pressure checked every year. You should have your blood pressure measured twice-once when you are at a hospital or clinic, and once when you are not at a hospital   or clinic. Record the average of the two measurements. To check your blood pressure when you are not at a hospital or clinic, you can use: ? An automated blood pressure machine at a pharmacy. ? A home blood pressure monitor.  Talk to your health care provider about your target blood pressure.  If you are between 56-27 years old, ask your health care provider if you should take aspirin to prevent heart disease.  Have regular diabetes screenings by checking your fasting blood sugar  level. ? If you are at a normal weight and have a low risk for diabetes, have this test once every three years after the age of 62. ? If you are overweight and have a high risk for diabetes, consider being tested at a younger age or more often.  A one-time screening for abdominal aortic aneurysm (AAA) by ultrasound is recommended for men aged 41-75 years who are current or former smokers. What should I know about preventing infection? Hepatitis B If you have a higher risk for hepatitis B, you should be screened for this virus. Talk with your health care provider to find out if you are at risk for hepatitis B infection. Hepatitis C Blood testing is recommended for:  Everyone born from [birth year] through 1966.  Anyone with known risk factors for hepatitis C. Sexually Transmitted Diseases (STDs)  You should be screened each year for STDs including gonorrhea and chlamydia if: ? You are sexually active and are younger than 69 years of age. ? You are older than 69 years of age and your health care provider tells you that you are at risk for this type of infection. ? Your sexual activity has changed since you were last screened and you are at an increased risk for chlamydia or gonorrhea. Ask your health care provider if you are at risk.  Talk with your health care provider about whether you are at high risk of being infected with HIV. Your health care provider may recommend a prescription medicine to help prevent HIV infection. What else can I do?  Schedule regular health, dental, and eye exams.  Stay current with your vaccines (immunizations).  Do not use any tobacco products, such as cigarettes, chewing tobacco, and e-cigarettes. If you need help quitting, ask your health care provider.  Limit alcohol intake to no more than 2 drinks per day. One drink equals 12 ounces of beer, 5 ounces of wine, or 1 ounces of hard liquor.  Do not use street drugs.  Do not share needles.  Ask your health  care provider for help if you need support or information about quitting drugs.  Tell your health care provider if you often feel depressed.  Tell your health care provider if you have ever been abused or do not feel safe at home. This information is not intended to replace advice given to you by your health care provider. Make sure you discuss any questions you have with your health care provider. Document Released: 09/27/2007 Document Revised: 11/28/2015 Document Reviewed: 01/02/2015 Elsevier Interactive Patient Education  2019 [place] Maintenance After Age 49 After age 32, you are at a higher risk for certain long-term diseases and infections as well as injuries from falls. Falls are a major cause of broken bones and head injuries in people who are older than age 46. Getting regular preventive care can help to keep you healthy and well. Preventive care includes getting regular testing and making lifestyle changes as recommended by your health  care provider. Talk with your health care provider about:  Which screenings and tests you should have. A screening is a test that checks for a disease when you have no symptoms.  A diet and exercise plan that is right for you. What should I know about screenings and tests to prevent falls? Screening and testing are the best ways to find a health problem early. Early diagnosis and treatment give you the best chance of managing medical conditions that are common after age 69. Certain conditions and lifestyle choices may make you more likely to have a fall. Your health care provider may recommend:  Regular vision checks. Poor vision and conditions such as cataracts can make you more likely to have a fall. If you wear glasses, make sure to get your prescription updated if your vision changes.  Medicine review. Work with your health care provider to regularly review all of the medicines you are taking, including over-the-counter medicines. Ask  your health care provider about any side effects that may make you more likely to have a fall. Tell your health care provider if any medicines that you take make you feel dizzy or sleepy.  Osteoporosis screening. Osteoporosis is a condition that causes the bones to get weaker. This can make the bones weak and cause them to break more easily.  Blood pressure screening. Blood pressure changes and medicines to control blood pressure can make you feel dizzy.  Strength and balance checks. Your health care provider may recommend certain tests to check your strength and balance while standing, walking, or changing positions.  Foot health exam. Foot pain and numbness, as well as not wearing proper footwear, can make you more likely to have a fall.  Depression screening. You may be more likely to have a fall if you have a fear of falling, feel emotionally low, or feel unable to do activities that you used to do.  Alcohol use screening. Using too much alcohol can affect your balance and may make you more likely to have a fall. What actions can I take to lower my risk of falls? General instructions  Talk with your health care provider about your risks for falling. Tell your health care provider if: ? You fall. Be sure to tell your health care provider about all falls, even ones that seem minor. ? You feel dizzy, sleepy, or off-balance.  Take over-the-counter and prescription medicines only as told by your health care provider. These include any supplements.  Eat a healthy diet and maintain a healthy weight. A healthy diet includes low-fat dairy products, low-fat (lean) meats, and fiber from whole grains, beans, and lots of fruits and vegetables. Home safety  Remove any tripping hazards, such as rugs, cords, and clutter.  Install safety equipment such as grab bars in bathrooms and safety rails on stairs.  Keep rooms and walkways well-lit. Activity   Follow a regular exercise program to stay fit.  This will help you maintain your balance. Ask your health care provider what types of exercise are appropriate for you.  If you need a cane or walker, use it as recommended by your health care provider.  Wear supportive shoes that have nonskid soles. Lifestyle  Do not drink alcohol if your health care provider tells you not to drink.  If you drink alcohol, limit how much you have: ? 0-1 drink a day for women. ? 0-2 drinks a day for men.  Be aware of how much alcohol is in your drink. In the  [place] one drink equals one typical bottle of beer (12 oz), one-half glass of wine (5 oz), or one shot of hard liquor (1 oz).  Do not use any products that contain nicotine or tobacco, such as cigarettes and e-cigarettes. If you need help quitting, ask your health care provider. Summary  Having a healthy lifestyle and getting preventive care can help to protect your health and wellness after age 56.  Screening and testing are the best way to find a health problem early and help you avoid having a fall. Early diagnosis and treatment give you the best chance for managing medical conditions that are more common for people who are older than age 68.  Falls are a major cause of broken bones and head injuries in people who are older than age 56. Take precautions to prevent a fall at home.  Work with your health care provider to learn what changes you can make to improve your health and wellness and to prevent falls. This information is not intended to replace advice given to you by your health care provider. Make sure you discuss any questions you have with your health care provider. Document Released: 02/11/2017 Document Revised: 02/11/2017 Document Reviewed: 02/11/2017 Elsevier Interactive Patient Education  2019 [employer].

## 2018-06-18 ENCOUNTER — Encounter: Payer: Self-pay | Admitting: Family Medicine

## 2018-06-18 LAB — COMPREHENSIVE METABOLIC PANEL
ALBUMIN: 4.4 g/dL (ref 3.8–4.8)
ALK PHOS: 88 IU/L (ref 39–117)
ALT: 3 IU/L (ref 0–44)
AST: 10 IU/L (ref 0–40)
Albumin/Globulin Ratio: 1.8 (ref 1.2–2.2)
BILIRUBIN TOTAL: 0.7 mg/dL (ref 0.0–1.2)
BUN / CREAT RATIO: 12 (ref 10–24)
BUN: 14 mg/dL (ref 8–27)
CHLORIDE: 102 mmol/L (ref 96–106)
CO2: 22 mmol/L (ref 20–29)
CREATININE: 1.14 mg/dL (ref 0.76–1.27)
Calcium: 9.5 mg/dL (ref 8.6–10.2)
GFR calc Af Amer: 76 mL/min/{1.73_m2} (ref >59)
GFR calc non Af Amer: 66 mL/min/{1.73_m2} (ref >59)
GLOBULIN, TOTAL: 2.5 g/dL (ref 1.5–4.5)
Glucose: 101 mg/dL — ABNORMAL HIGH (ref 65–99)
POTASSIUM: 4.7 mmol/L (ref 3.5–5.2)
SODIUM: 139 mmol/L (ref 134–144)
Total Protein: 6.9 g/dL (ref 6.0–8.5)

## 2018-06-18 LAB — CBC WITH DIFFERENTIAL/PLATELET
BASOS ABS: 0.1 10*3/uL (ref 0.0–0.2)
Basos: 1 %
EOS (ABSOLUTE): 0.1 10*3/uL (ref 0.0–0.4)
EOS: 2 %
HEMATOCRIT: 44.9 % (ref 37.5–51.0)
Hemoglobin: 15.7 g/dL (ref 13.0–17.7)
Immature Grans (Abs): 0 10*3/uL (ref 0.0–0.1)
Immature Granulocytes: 0 %
LYMPHS ABS: 0.9 10*3/uL (ref 0.7–3.1)
Lymphs: 14 %
MCH: 32 pg (ref 26.6–33.0)
MCHC: 35 g/dL (ref 31.5–35.7)
MCV: 91 fL (ref 79–97)
MONOS ABS: 0.4 10*3/uL (ref 0.1–0.9)
Monocytes: 6 %
NEUTROS ABS: 5.1 10*3/uL (ref 1.4–7.0)
Neutrophils: 77 %
Platelets: 175 10*3/uL (ref 150–450)
RBC: 4.91 x10E6/uL (ref 4.14–5.80)
RDW: 11.9 % (ref 11.6–15.4)
WBC: 6.6 10*3/uL (ref 3.4–10.8)

## 2018-06-18 LAB — LIPID PANEL W/O CHOL/HDL RATIO
Cholesterol, Total: 126 mg/dL (ref 100–199)
HDL: 48 mg/dL (ref >39)
LDL Calculated: 67 mg/dL (ref 0–99)
Triglycerides: 53 mg/dL (ref 0–149)
VLDL Cholesterol Cal: 11 mg/dL (ref 5–40)

## 2018-06-18 LAB — PSA: Prostate Specific Ag, Serum: 1.9 ng/mL (ref 0.0–4.0)

## 2018-06-18 LAB — TSH: TSH: 1.41 u[IU]/mL (ref 0.450–4.500)

## 2018-06-24 DIAGNOSIS — B070 Plantar wart: Secondary | ICD-10-CM | POA: Diagnosis not present

## 2018-06-28 ENCOUNTER — Telehealth: Payer: Self-pay | Admitting: Family Medicine

## 2018-06-28 NOTE — Telephone Encounter (Signed)
Copied from [place] [id number]. Topic: General - Other >> Jun 28, 2018  1:48 PM Yvette Rack wrote: Reason for CRM: Pt wife [name] called to discuss lab results that they received via mail. Pt wife stated she has some questions regarding the results and would like a nurse to return the call. Cb# [phone]

## 2018-06-29 NOTE — Telephone Encounter (Signed)
Wife has reviewed lab results and has concerns about the albumin level- she is concerned with the level being so high it could signify chronic kidney disease. She would like Dr Durenda Age thoughts on that.

## 2018-07-01 ENCOUNTER — Other Ambulatory Visit: Payer: Self-pay | Admitting: Neurology

## 2018-07-02 NOTE — Telephone Encounter (Signed)
Patients wife notified

## 2018-07-02 NOTE — Telephone Encounter (Signed)
His kidney function is completely normal right now. I would not be worried about this.

## 2018-07-27 DIAGNOSIS — B070 Plantar wart: Secondary | ICD-10-CM | POA: Diagnosis not present

## 2018-07-27 DIAGNOSIS — M79672 Pain in left foot: Secondary | ICD-10-CM | POA: Diagnosis not present

## 2018-08-18 ENCOUNTER — Other Ambulatory Visit: Payer: Self-pay | Admitting: Neurology

## 2018-08-18 DIAGNOSIS — G20 Parkinson's disease: Secondary | ICD-10-CM

## 2018-08-18 MED ORDER — ROPINIROLE HCL 2 MG PO TABS: 2.0000 mg | ORAL_TABLET | Freq: Three times a day (TID) | ORAL | 1 refills | Status: DC

## 2018-08-28 ENCOUNTER — Other Ambulatory Visit: Payer: Self-pay

## 2018-08-28 ENCOUNTER — Emergency Department: Payer: Medicare HMO

## 2018-08-28 ENCOUNTER — Encounter: Payer: Self-pay | Admitting: Emergency Medicine

## 2018-08-28 ENCOUNTER — Emergency Department
Admission: EM | Admit: 2018-08-28 | Discharge: 2018-08-29 | Disposition: A | Discharge status: Home / Self Care | Payer: Medicare HMO | Attending: Emergency Medicine | Admitting: Emergency Medicine

## 2018-08-28 DIAGNOSIS — Z79899 Other long term (current) drug therapy: Secondary | ICD-10-CM | POA: Insufficient documentation

## 2018-08-28 DIAGNOSIS — M79605 Pain in left leg: Secondary | ICD-10-CM | POA: Diagnosis not present

## 2018-08-28 DIAGNOSIS — G20 Parkinson's disease: Secondary | ICD-10-CM | POA: Diagnosis not present

## 2018-08-28 DIAGNOSIS — M25552 Pain in left hip: Secondary | ICD-10-CM | POA: Diagnosis present

## 2018-08-28 DIAGNOSIS — M79662 Pain in left lower leg: Secondary | ICD-10-CM | POA: Diagnosis not present

## 2018-08-28 DIAGNOSIS — Z8673 Personal history of transient ischemic attack (TIA), and cerebral infarction without residual deficits: Secondary | ICD-10-CM | POA: Diagnosis not present

## 2018-08-28 DIAGNOSIS — I10 Essential (primary) hypertension: Secondary | ICD-10-CM | POA: Insufficient documentation

## 2018-08-28 DIAGNOSIS — I82502 Chronic embolism and thrombosis of unspecified deep veins of left lower extremity: Secondary | ICD-10-CM | POA: Diagnosis not present

## 2018-08-28 DIAGNOSIS — M5432 Sciatica, left side: Secondary | ICD-10-CM | POA: Diagnosis not present

## 2018-08-28 LAB — CBC WITH DIFFERENTIAL/PLATELET
Abs Immature Granulocytes: 0.01 10*3/uL (ref 0.00–0.07)
Basophils Absolute: 0.1 10*3/uL (ref 0.0–0.1)
Basophils Relative: 1 %
Eosinophils Absolute: 0.2 10*3/uL (ref 0.0–0.5)
Eosinophils Relative: 4 %
HCT: 43.5 % (ref 39.0–52.0)
Hemoglobin: 14.5 g/dL (ref 13.0–17.0)
Immature Granulocytes: 0 %
Lymphocytes Relative: 24 %
Lymphs Abs: 1.5 10*3/uL (ref 0.7–4.0)
MCH: 30.3 pg (ref 26.0–34.0)
MCHC: 33.3 g/dL (ref 30.0–36.0)
MCV: 91 fL (ref 80.0–100.0)
Monocytes Absolute: 0.5 10*3/uL (ref 0.1–1.0)
Monocytes Relative: 8 %
Neutro Abs: 3.9 10*3/uL (ref 1.7–7.7)
Neutrophils Relative %: 63 %
Platelets: 154 10*3/uL (ref 150–400)
RBC: 4.78 MIL/uL (ref 4.22–5.81)
RDW: 11.9 % (ref 11.5–15.5)
WBC: 6.2 10*3/uL (ref 4.0–10.5)
nRBC: 0 % (ref 0.0–0.2)

## 2018-08-28 LAB — COMPREHENSIVE METABOLIC PANEL
ALT: <5 U/L (ref 0–44)
AST: 11 U/L — ABNORMAL LOW (ref 15–41)
Albumin: 4 g/dL (ref 3.5–5.0)
Alkaline Phosphatase: 71 U/L (ref 38–126)
Anion gap: 6 (ref 5–15)
BUN: 21 mg/dL (ref 8–23)
CO2: 25 mmol/L (ref 22–32)
Calcium: 8.9 mg/dL (ref 8.9–10.3)
Chloride: 110 mmol/L (ref 98–111)
Creatinine, Ser: 0.91 mg/dL (ref 0.61–1.24)
GFR calc Af Amer: >60 mL/min (ref >60)
GFR calc non Af Amer: >60 mL/min (ref >60)
Glucose, Bld: 125 mg/dL — ABNORMAL HIGH (ref 70–99)
Potassium: 4.2 mmol/L (ref 3.5–5.1)
Sodium: 141 mmol/L (ref 135–145)
Total Bilirubin: 0.5 mg/dL (ref 0.3–1.2)
Total Protein: 6.8 g/dL (ref 6.5–8.1)

## 2018-08-28 MED ORDER — METHOCARBAMOL 500 MG PO TABS
500.0000 mg | ORAL_TABLET | Freq: Once | ORAL | Status: AC
  Administered 2018-08-28: 23:00:00 500 mg via ORAL
  Filled 2018-08-28: qty 1

## 2018-08-28 MED ORDER — DEXAMETHASONE SODIUM PHOSPHATE 10 MG/ML IJ SOLN: 10.0000 mg | Freq: Once | INTRAMUSCULAR | Status: DC

## 2018-08-28 MED ORDER — DEXAMETHASONE 10 MG/ML FOR PEDIATRIC ORAL USE
10.0000 mg | Freq: Once | INTRAMUSCULAR | Status: AC
  Administered 2018-08-28: 10 mg via ORAL
  Filled 2018-08-28: qty 1

## 2018-08-28 MED ORDER — METHOCARBAMOL 500 MG PO TABS: 1000.0000 mg | ORAL_TABLET | Freq: Once | ORAL | Status: DC

## 2018-08-28 NOTE — ED Provider Notes (Signed)
[facility] Emergency Department Provider Note  ____________________________________________  Time seen: Approximately 10:58 PM  I have reviewed the triage vital signs and the nursing notes.   HISTORY  Chief Complaint Hip Pain    HPI Thomas [name] is a 68 y.o. male with a history of Parkinson's disease, presents to the emergency department with low back pain with sharp tingling of the left lower extremity that radiates from thigh to ankle that occurred tonight after patient was vacuuming his stairs.  Patient reports that pain was atypical for him and he became concerned.  Patient has a history of DVTs and is currently taking Xarelto and he wanted to make sure that he had not developed a new DVT.  Patient reports that when he developed DVTs or PEs in the past, thromboembolism was unprovoked.  He denies knowledge of erythema or swelling of the left lower extremity.  Patient denies chest pain, chest tightness and shortness of breath.  He denies falls or other mechanisms of trauma.  He denies bowel or bladder incontinence or saddle anesthesia.  No other alleviating measures have been attempted.        Past Medical History:  Diagnosis Date  . Allergy   . Arthritis    Osteoarthritis  . Arthritis of knee   . Atrial fibrillation ([place])   . DVT (deep venous thrombosis) ([facility])    L leg  . Hyperchloremia   . Hypercholesterolemia   . Hypertension   . Nephrolithiasis   . Parkinson's disease ([place])   . Plantar wart   . Pulmonary embolism ([place])   . Seasonal allergies   . TIA (transient ischemic attack)     Patient Active Problem List   Diagnosis Date Noted  . Atrial fibrillation ([place]) 08/23/2015  . Erectile dysfunction 03/01/2015  . Hypercholesterolemia   . Hypertension   . DVT (deep venous thrombosis) ([place])   . Parkinson's disease ([place])   . Pulmonary embolism [facility])     Past Surgical History:  Procedure Laterality Date  . APPENDECTOMY    . KNEE ARTHROSCOPY  WITH MENISCAL REPAIR Right 2009  . KNEE SURGERY Right   . T&A    . TONSILLECTOMY      Prior to Admission medications   Medication Sig Start Date End Date Taking? Authorizing Provider  ALPRAZolam Duanne Moron) 0.5 MG tablet Take 1 tab prior to dermatology appointment. 06/17/18   Johnson, Megan P, DO  amantadine (SYMMETREL) 100 MG capsule Take 1 capsule (100 mg total) by mouth 3 (three) times daily. 02/09/18   Tat, Eustace Quail, DO  carbidopa-levodopa (SINEMET IR) 25-100 MG tablet TAKE 2 TABLETS AT 6 AM, 10 AM, AND 2 PM, AND TAKE 1 TABLET AT 6 PM AS DIRECTED. 07/02/18   Tat, Eustace Quail, DO  cetirizine (ZYRTEC) 10 MG tablet Take 10 mg by mouth daily.    [provider]  lisinopril (PRINIVIL,ZESTRIL) 30 MG tablet Take 1 tablet (30 mg total) by mouth daily. 06/17/18   Johnson, Megan P, DO  Melatonin 2.5 MG CAPS Take 1 capsule by mouth at bedtime as needed (for sleep).     [provider]  methocarbamol (ROBAXIN) 500 MG tablet Take 1 tablet (500 mg total) by mouth daily for 7 days. 08/29/18 09/05/18  Lannie Fields, PA-C  metoprolol succinate (TOPROL-XL) 25 MG 24 hr tablet Take 1 tablet (25 mg total) by mouth daily. 06/17/18   Johnson, Megan P, DO  predniSONE (STERAPRED UNI-PAK 21 TAB) 10 MG (21) TBPK tablet Take 6 tablets  the first day, take 5 tablets the second day, take 4 tablets the third day, take 3 tablets the fourth day, take 2 tablets the fifth day, take 1 tablet the sixth day. 08/29/18   Vallarie Mare M, PA-C  PREVIDENT 5000 BOOSTER PLUS 1.1 % PSTE See admin instructions. 02/26/18   [provider]  rOPINIRole (REQUIP) 2 MG tablet Take 1 tablet (2 mg total) by mouth 3 (three) times daily. 08/18/18   Tat, Eustace Quail, DO  sildenafil (REVATIO) 20 MG tablet Take 1 tablet (20 mg total) by mouth 3 (three) times daily as needed. 06/17/18   Johnson, Megan P, DO  simvastatin (ZOCOR) 40 MG tablet Take 1 tablet (40 mg total) by mouth daily. 06/17/18   Johnson, Megan P, DO  XARELTO 20 MG TABS tablet  TAKE 1 TABLET EVERY DAY 05/26/18   [name], DO    Allergies Codeine  Family History  Problem Relation Age of Onset  . Parkinson's disease Father   . Coronary artery disease Mother   . Heart attack Sister   . Deep vein thrombosis Sister     Social History Social History   Tobacco Use  . Smoking status: Never Smoker  . Smokeless tobacco: Never Used  Substance Use Topics  . Alcohol use: No    Alcohol/week: 0.0 standard drinks  . Drug use: No     Review of Systems  Constitutional: No fever/chills Eyes: No visual changes. No discharge ENT: No upper respiratory complaints. Cardiovascular: no chest pain. Respiratory: no cough. No SOB. Gastrointestinal: No abdominal pain.  No nausea, no vomiting.  No diarrhea.  No constipation. Genitourinary: Negative for dysuria. No hematuria Musculoskeletal: Patient has left leg pain.  Skin: Negative for rash, abrasions, lacerations, ecchymosis. Neurological: Negative for headaches, focal weakness or numbness.   ____________________________________________   PHYSICAL EXAM:  VITAL SIGNS: ED Triage Vitals  Enc Vitals Group     BP 08/28/18 2147 (!) 180/95     Pulse Rate 08/28/18 2147 65     Resp 08/28/18 2147 18     Temp 08/28/18 2147 98.1 F (36.7 C)     Temp Source 08/28/18 2147 Oral     SpO2 08/28/18 2147 97 %     Weight 08/28/18 2147 245 lb (111.1 kg)     Height 08/28/18 2147 5\' 10"  (1.778 m)     Head Circumference --      Peak Flow --      Pain Score 08/28/18 2152 8     Pain Loc --      Pain Edu? --      Excl. in [place]? --      Constitutional: Alert and oriented. Well appearing and in no acute distress. Eyes: Conjunctivae are normal. PERRL. EOMI. Head: Atraumatic. Cardiovascular: Normal rate, regular rhythm. Normal S1 and S2.  Good peripheral circulation. Respiratory: Normal respiratory effort without tachypnea or retractions. Lungs CTAB. Good air entry to the bases with no decreased or absent breath  sounds. Gastrointestinal: Bowel sounds 4 quadrants. Soft and nontender to palpation. No guarding or rigidity. No palpable masses. No distention. No CVA tenderness. Musculoskeletal: Full range of motion to all extremities. No gross deformities appreciated. Positive straight leg raise test, left.  Neurologic:  Normal speech and language. No gross focal neurologic deficits are appreciated.  Skin: Patient has macular, erythematous rash of lower extremities. Psychiatric: Mood and affect are normal. Speech and behavior are normal. Patient exhibits appropriate insight and judgement.   ____________________________________________   LABS (all  labs ordered are listed, but only abnormal results are displayed)  Labs Reviewed  COMPREHENSIVE METABOLIC PANEL - Abnormal; Notable for the following components:      Result Value   Glucose, Bld 125 (*)    AST 11 (*)    All other components within normal limits  CBC WITH DIFFERENTIAL/PLATELET   ____________________________________________  EKG   ____________________________________________  RADIOLOGY I personally viewed and evaluated these images as part of my medical decision making, as well as reviewing the written report by the radiologist.    US Venous Img Lower Unilateral Left  Result Date: 08/28/2018 CLINICAL DATA:  Left lower extremity pain. History of left lower extremity DVT. EXAM: LEFT LOWER EXTREMITY VENOUS DOPPLER ULTRASOUND TECHNIQUE: Gray-scale sonography with graded compression, as well as color Doppler and duplex ultrasound were performed to evaluate the lower extremity deep venous systems from the level of the common femoral vein and including the common femoral, femoral, profunda femoral, popliteal and calf veins including the posterior tibial, peroneal and gastrocnemius veins when visible. The superficial great saphenous vein was also interrogated. Spectral Doppler was utilized to evaluate flow at rest and with distal augmentation  maneuvers in the common femoral, femoral and popliteal veins. COMPARISON:  Left lower extremity duplex 01/02/2015, 06/23/2014 FINDINGS: Contralateral Common Femoral Vein: Respiratory phasicity is normal and symmetric with the symptomatic side. No evidence of thrombus. Normal compressibility. Common Femoral Vein: No evidence of thrombus. Normal compressibility, respiratory phasicity and response to augmentation. Saphenofemoral Junction: No evidence of thrombus. Normal compressibility and flow on color Doppler imaging. Profunda Femoral Vein: No evidence of thrombus. Normal compressibility and flow on color Doppler imaging. Femoral Vein: Previous nonocclusive thrombus has resolved. Popliteal Vein: Partially occlusive thrombus in the popliteal vein again seen. Calf Veins: Partially occlusive thrombus in the posterior tibial vein which had previously been patent. Occlusive thrombus of the peroneal vein which had previously been patent. Superficial Great Saphenous Vein: Nonocclusive thrombus in the greater saphenous vein. Venous Reflux:  None. Other Findings:  None. IMPRESSION: History of left lower extremity DVT in 2016. Partially occlusive popliteal vein thrombus is chronic. Previous femoral vein thrombus has resolved. However thrombus in the calf veins is new/progressed from 2016. These results were called by telephone at the time of interpretation on 08/28/2018 at 11:39 pm to [place] , who verbally acknowledged these results. Electronically Signed   By: Keith Rake M.D.   On: 08/28/2018 23:40    ____________________________________________    PROCEDURES  Procedure(s) performed:    Procedures    Medications  dexamethasone (DECADRON) injection 10 mg (10 mg Intravenous Not Given 08/28/18 2328)  methocarbamol (ROBAXIN) tablet 500 mg (500 mg Oral Given 08/28/18 2328)  dexamethasone (DECADRON) 10 MG/ML injection for Pediatric ORAL use 10 mg (10 mg Oral Given 08/28/18 2328)      ____________________________________________   INITIAL IMPRESSION / ASSESSMENT AND PLAN / ED COURSE  Pertinent labs & imaging results that were available during my care of the patient were reviewed by me and considered in my medical decision making (see chart for details).  Review of the [place] CSRS was performed in accordance of the [place] prior to dispensing any controlled drugs.           Assessment and Plan:  Acute on chronic DVT Sciatica 68 year old male presents to the emergency department with left lower extremity pain.  On physical exam, patient had a positive straight leg raise test and no swelling of the left calf or superficial erythema.  Differential diagnosis  included acute on chronic DVT versus sciatica.  Venous ultrasound of the left lower extremity revealed new thrombus formation in the calf veins since 2016. Dr. Shelia Media, vascular surgeon on-call, was consulted who recommended maintaining patient on Xarelto and close follow-up with his hematologist, Dr. Mike Gip.  Patient reported that his left lower extremity pain improved after Decadron and Robaxin were administered in the emergency department.  Patient was discharged with tapered prednisone and Robaxin.  Information regarding this emergency department encounter was conveyed to patient's wife and she also voiced understanding.  All patient questions were answered.  ____________________________________________  FINAL CLINICAL IMPRESSION(S) / ED DIAGNOSES  Final diagnoses:  Pain of left lower extremity      NEW MEDICATIONS STARTED DURING THIS VISIT:  ED Discharge Orders         Ordered    predniSONE (STERAPRED UNI-PAK 21 TAB) 10 MG (21) TBPK tablet     08/29/18 0005    methocarbamol (ROBAXIN) 500 MG tablet  Daily     08/29/18 0005              This chart was dictated using voice recognition software/Dragon. Despite best efforts to proofread, errors can occur which can change the meaning. Any change was  purely unintentional.    Lannie Fields, PA-C 08/29/18 Judithann Sauger    Hinda Kehr, MD 08/29/18 [phone]

## 2018-08-28 NOTE — ED Notes (Signed)
Pt provided with urinal

## 2018-08-28 NOTE — ED Triage Notes (Signed)
Pt arrives POV to triage with c/o left hip pain which radiates down his leg. Pain denies focal pain in leg or injury or trauma. Pt is in NAD.

## 2018-08-28 NOTE — ED Notes (Signed)
Patient transported to Ultrasound 

## 2018-08-29 MED ORDER — PREDNISONE 10 MG (21) PO TBPK: ORAL_TABLET | ORAL | 0 refills | Status: DC

## 2018-08-29 MED ORDER — METHOCARBAMOL 500 MG PO TABS: 500.0000 mg | ORAL_TABLET | Freq: Every day | ORAL | 0 refills | Status: AC

## 2018-08-29 NOTE — Discharge Instructions (Addendum)
The pain you experienced tonight is likely due to referred pain from your back and not due to DVTs. You have been prescribed prednisone and Robaxin. I would like for you to follow-up with your hematologist to make sure that Xarelto is the appropriate treatment for your acute on chronic DVT.

## 2018-08-31 DIAGNOSIS — M9903 Segmental and somatic dysfunction of lumbar region: Secondary | ICD-10-CM | POA: Diagnosis not present

## 2018-08-31 DIAGNOSIS — M5136 Other intervertebral disc degeneration, lumbar region: Secondary | ICD-10-CM | POA: Diagnosis not present

## 2018-08-31 DIAGNOSIS — M9905 Segmental and somatic dysfunction of pelvic region: Secondary | ICD-10-CM | POA: Diagnosis not present

## 2018-08-31 DIAGNOSIS — M9904 Segmental and somatic dysfunction of sacral region: Secondary | ICD-10-CM | POA: Diagnosis not present

## 2018-08-31 NOTE — Progress Notes (Signed)
[facility]  [address] [place],  [address] Phone: [phone]  Fax: [phone]   Clinic Day:  09/01/2018  Referring physician: [name], DO  Chief Complaint: Thomas [name] is a 68 y.o. male with a distant history of cerebrovascular accident and a recent history of deep venous thrombosis and pulmonary embolism who is seen following a recent hospitalization.  HPI: The patient was last seen in the medical hematology clinic on 04/27/2017. At that time, he had side effects from his Parkinson's medications.  He was exercising at [employer]. Exam was unremarkable. He was followed by his PCP, Park Liter, DO, and in neurology by Alonza Bogus, DO.   On 08/28/2018 he presented to the [facility] ED for severe left hip pain radiating to his low back and left leg in addition to tingling in LLE. Venous US of LLE revealed a partially occlusive chronic popliteal vein thrombus. There was a thrombus in the calf veins, new/progressed since 2016.  Dr. Shelia Media, vascular surgery, was consulted and recommended continuation of Xarelto.  Patient experienced improvement in the ED with Decadron and Robaxin. He was discharged with a prednisone taper and Robaxin.   During the interim, he is doing "quite a bit better."  Tremors in his hands increased following the ED visit. His wife notes he is restless. Pain has improved. He denies any falls. He has visited his chiropractor. His wife notes general weakness and difficulty standing up from a chair.   He was working out and boxing with a Parkinson's group before his gym closed due to the COVID-19. He has been walking up and down stairs to exercise at home. He had a planters wart on his foot removed, and he has not been able to put pressure on his foot or walk normally since.   He denies missing any Xarleto doses. He denies any issues with bruising or bleeding. He has not seen his neurologist since 12/09/2017 but his wife will  follow-up with them to get a visit soon.    Past Medical History:  Diagnosis Date  . Allergy   . Arthritis    Osteoarthritis  . Arthritis of knee   . Atrial fibrillation ([place])   . DVT (deep venous thrombosis) ([facility])    L leg  . Hyperchloremia   . Hypercholesterolemia   . Hypertension   . Nephrolithiasis   . Parkinson's disease ([place])   . Plantar wart   . Pulmonary embolism ([place])   . Seasonal allergies   . TIA (transient ischemic attack)     Past Surgical History:  Procedure Laterality Date  . APPENDECTOMY    . KNEE ARTHROSCOPY WITH MENISCAL REPAIR Right 2009  . KNEE SURGERY Right   . T&A    . TONSILLECTOMY      Family History  Problem Relation Age of Onset  . Parkinson's disease Father   . Coronary artery disease Mother   . Heart attack Sister   . Deep vein thrombosis Sister     Social History:  reports that he has never smoked. He has never used smokeless tobacco. He reports that he does not drink alcohol or use drugs. He reports that he does not drink alcohol.  He works out at [employer] 3 times a week. The patient's wife is [place]. He is alone today with his wife on the [place].   Allergies:  Allergies  Allergen Reactions  . Codeine Other (See Comments)    Reaction: Hallucinations    Current  Medications: Current Outpatient Medications  Medication Sig Dispense Refill  . ALPRAZolam (XANAX) 0.5 MG tablet Take 1 tab prior to dermatology appointment. 5 tablet 0  . amantadine (SYMMETREL) 100 MG capsule Take 1 capsule (100 mg total) by mouth 3 (three) times daily. 270 capsule 1  . carbidopa-levodopa (SINEMET IR) 25-100 MG tablet TAKE 2 TABLETS AT 6 AM, 10 AM, AND 2 PM, AND TAKE 1 TABLET AT 6 PM AS DIRECTED. 630 tablet 1  . cetirizine (ZYRTEC) 10 MG tablet Take 10 mg by mouth daily.    Marland Kitchen lisinopril (PRINIVIL,ZESTRIL) 30 MG tablet Take 1 tablet (30 mg total) by mouth daily. 90 tablet 1  . Melatonin 2.5 MG CAPS Take 1 capsule by mouth at bedtime as needed (for sleep).      . methocarbamol (ROBAXIN) 500 MG tablet Take 1 tablet (500 mg total) by mouth daily for 7 days. 7 tablet 0  . metoprolol succinate (TOPROL-XL) 25 MG 24 hr tablet Take 1 tablet (25 mg total) by mouth daily. 30 tablet 11  . predniSONE (STERAPRED UNI-PAK 21 TAB) 10 MG (21) TBPK tablet Take 6 tablets the first day, take 5 tablets the second day, take 4 tablets the third day, take 3 tablets the fourth day, take 2 tablets the fifth day, take 1 tablet the sixth day. 21 tablet 0  . PREVIDENT 5000 BOOSTER PLUS 1.1 % PSTE See admin instructions.    Marland Kitchen rOPINIRole (REQUIP) 2 MG tablet Take 1 tablet (2 mg total) by mouth 3 (three) times daily. 270 tablet 1  . sildenafil (REVATIO) 20 MG tablet Take 1 tablet (20 mg total) by mouth 3 (three) times daily as needed. 90 tablet 6  . simvastatin (ZOCOR) 40 MG tablet Take 1 tablet (40 mg total) by mouth daily. 90 tablet 1  . XARELTO 20 MG TABS tablet TAKE 1 TABLET EVERY DAY 90 tablet 1   No current facility-administered medications for this visit.     Review of Systems  Constitutional: Negative for chills, diaphoresis, fever, malaise/fatigue and weight loss.       "Doing quite a bit better."  HENT: Negative for congestion, hearing loss, nosebleeds, sinus pain and sore throat.   Eyes: Negative for blurred vision.  Respiratory: Negative for cough, shortness of breath and wheezing.   Cardiovascular: Negative for chest pain, palpitations, claudication, leg swelling and PND.  Gastrointestinal: Negative for abdominal pain, blood in stool, constipation, diarrhea, heartburn, melena, nausea and vomiting.  Genitourinary: Negative for dysuria, hematuria and urgency.  Musculoskeletal: Positive for back pain (lower) and joint pain (knees). Negative for myalgias.  Skin: Negative for rash.  Neurological: Positive for tremors (hands) and weakness. Negative for dizziness, tingling, sensory change and headaches.       Restless.  Endo/Heme/Allergies: Does not bruise/bleed easily.   Psychiatric/Behavioral: Negative for depression and memory loss. The patient has insomnia (restless sleep). The patient is not nervous/anxious.        Parkinson's.  All other systems reviewed and are negative.  Performance status (ECOG):  2  Physical Exam  Constitutional: He is oriented to person, place, and time. He appears well-developed and well-nourished. No distress.  HENT:  Head: Normocephalic and atraumatic.  Mouth/Throat: Oropharynx is clear and moist.  Short white hair.  Eyes: Pupils are equal, round, and reactive to light. Conjunctivae and EOM are normal. No scleral icterus.  Glasses. Blue eyes.  Neck: Normal range of motion. Neck supple. No JVD present.  Cardiovascular: Normal rate, regular rhythm and intact distal pulses.  No murmur heard. Pulmonary/Chest: Effort normal and breath sounds normal. No respiratory distress. He has no wheezes.  Abdominal: Soft. Bowel sounds are normal. He exhibits no distension and no mass. There is no abdominal tenderness. There is no rebound and no guarding.  Musculoskeletal: Normal range of motion.        General: No edema.     Comments: Subtle lower extremity asymmetry (left calf > right calf).  No palpable cords.  Lymphadenopathy:    He has no cervical adenopathy.  Neurological: He is alert and oriented to person, place, and time.  Skin: Skin is warm and dry. No rash noted. He is not diaphoretic. No erythema. No pallor.  Psychiatric: He has a normal mood and affect. His behavior is normal. Judgment and thought content normal.  Nursing note and vitals reviewed.   No visits with results within 3 Day(s) from this visit.  Latest known visit with results is:  Admission on 08/28/2018, Discharged on 08/29/2018  Component Date Value Ref Range Status  . WBC 08/28/2018 6.2  4.0 - 10.5 K/uL Final  . RBC 08/28/2018 4.78  4.22 - 5.81 MIL/uL Final  . Hemoglobin 08/28/2018 14.5  13.0 - 17.0 g/dL Final  . HCT 08/28/2018 43.5  39.0 - 52.0 % Final  .  MCV 08/28/2018 91.0  80.0 - 100.0 fL Final  . MCH 08/28/2018 30.3  26.0 - 34.0 pg Final  . MCHC 08/28/2018 33.3  30.0 - 36.0 g/dL Final  . RDW 08/28/2018 11.9  11.5 - 15.5 % Final  . Platelets 08/28/2018 154  150 - 400 K/uL Final  . nRBC 08/28/2018 0.0  0.0 - 0.2 % Final  . Neutrophils Relative % 08/28/2018 63  % Final  . Neutro Abs 08/28/2018 3.9  1.7 - 7.7 K/uL Final  . Lymphocytes Relative 08/28/2018 24  % Final  . Lymphs Abs 08/28/2018 1.5  0.7 - 4.0 K/uL Final  . Monocytes Relative 08/28/2018 8  % Final  . Monocytes Absolute 08/28/2018 0.5  0.1 - 1.0 K/uL Final  . Eosinophils Relative 08/28/2018 4  % Final  . Eosinophils Absolute 08/28/2018 0.2  0.0 - 0.5 K/uL Final  . Basophils Relative 08/28/2018 1  % Final  . Basophils Absolute 08/28/2018 0.1  0.0 - 0.1 K/uL Final  . Immature Granulocytes 08/28/2018 0  % Final  . Abs Immature Granulocytes 08/28/2018 0.01  0.00 - 0.07 K/uL Final   Performed at [facility], [address] [place], [place] [address]  . Sodium 08/28/2018 141  135 - 145 mmol/L Final  . Potassium 08/28/2018 4.2  3.5 - 5.1 mmol/L Final  . Chloride 08/28/2018 110  98 - 111 mmol/L Final  . CO2 08/28/2018 25  22 - 32 mmol/L Final  . Glucose, Bld 08/28/2018 125* 70 - 99 mg/dL Final  . BUN 08/28/2018 21  8 - 23 mg/dL Final  . Creatinine, Ser 08/28/2018 0.91  0.61 - 1.24 mg/dL Final  . Calcium 08/28/2018 8.9  8.9 - 10.3 mg/dL Final  . Total Protein 08/28/2018 6.8  6.5 - 8.1 g/dL Final  . Albumin 08/28/2018 4.0  3.5 - 5.0 g/dL Final  . AST 08/28/2018 11* 15 - 41 U/L Final  . ALT 08/28/2018 <5  0 - 44 U/L Final  . Alkaline Phosphatase 08/28/2018 71  38 - 126 U/L Final  . Total Bilirubin 08/28/2018 0.5  0.3 - 1.2 mg/dL Final  . GFR calc non Af Amer 08/28/2018 >60  >60 mL/min Final  . GFR  calc Af Amer 08/28/2018 >60  >60 mL/min Final  . Anion gap 08/28/2018 6  5 - 15 Final   Performed at [facility], [address] [place], [place] [address]     Assessment:  Thomas [name] is a 68 y.o. male with a history of a cerebral vascular accident in 2007 and a history of deep venous thrombosis and pulmonary embolism on 12/07/2013.  There were no precipitating events to his PE and DVT.  He is on Xarelto.  Work-up on 05/19/2014 revealed the following negative studies:  Factor V Leiden, beta-2 glycoprotein, and anticardiolipin antibodies. Protein S activity and antigen, and antithrombin III activity were normal.  Additional testing on 06/14/2014 revealed a normal CBC, CMP, protein C antigen and activity, prothrombin gene mutation, and lupus anticoagulant panel.  D-dimers were elevated indicating active clot breakdown.  Lower extremity duplex on 06/23/2014 revealed persistent partially occlusive thrombus in the left posterior tibial, popliteal, and mid femoral veins.   Left lower extremity duplex on 01/02/2015 revealed persistent but improving nonocclusive partial thrombus in the femoral and popliteal veins.  Calf veins were patient.  Left lower extremity duplex on 08/28/2018 revealed a partially occlusive chronic popliteal vein thrombus.  There was a thrombus in the calf veins, new/progressed since 2016.   Chest CT on 06/23/2014 revealed no pulmonary nodules or adenopathy. PSA was normal on 09/26/2014.    He has been on Xarelto since 11/2013.  He denies any bruising or bleeding.   Symptomatically, he notes improvement in his left leg discomfort.  Pain may have been do to sciatica.  Plan: 1. Left lower extremity DVT and pulmonary embolism  Patient has a distant history of LLE DVT and pulmonary embolism.  Review interim events and recent left lower extremity duplex.  It is unclear if the thrombus in his calf is an acute clot (doubt).  Discuss plan for long term anticoagulation.  Doubt failure of current Xarelto.  Discuss referral to Dr Lucky Cowboy, vascular medicine, to assess clot (new versus old/chronic).  Discuss follow-up   Discuss follow-up after  vascular evaluation if clot is acute to address change in anticoagulation.   Discuss follow-up as needed or yearly if clot is chronic.  Patient requests yearly assessment. 2.   Consult vascular medicine (Dr Lucky Cowboy).  Appointment on 09/03/2018. 3.   RTC in 1 year for MD assessment and labs (CBC with diff, CMP).  I discussed the assessment and treatment plan with the patient.  The patient was provided an opportunity to ask questions and all were answered.  The patient agreed with the plan and demonstrated an understanding of the instructions.  The patient was advised to call back if the symptoms worsen or if the condition fails to improve as anticipated.  I provided 25 minutes of face-to-face time during this this encounter and > 50% was spent counseling as documented under my assessment and plan.    Lequita Asal, MD, PhD    09/01/2018, 10:51 AM  I, Cloyde Reams Dorshimer, am acting as Education administrator for [employer]. Thomas Gip, MD, PhD.  I,  C. Thomas Gip, MD, have reviewed the above documentation for accuracy and completeness, and I agree with the above.

## 2018-09-01 ENCOUNTER — Inpatient Hospital Stay: Payer: Medicare HMO | Attending: Hematology and Oncology | Admitting: Hematology and Oncology

## 2018-09-01 ENCOUNTER — Other Ambulatory Visit: Payer: Self-pay

## 2018-09-01 ENCOUNTER — Telehealth: Payer: Self-pay

## 2018-09-01 ENCOUNTER — Encounter: Payer: Self-pay | Admitting: Hematology and Oncology

## 2018-09-01 VITALS — BP 138/85 | HR 62 | Temp 97.8°F | Resp 18 | Wt 246.5 lb

## 2018-09-01 DIAGNOSIS — Z86711 Personal history of pulmonary embolism: Secondary | ICD-10-CM

## 2018-09-01 DIAGNOSIS — Z79899 Other long term (current) drug therapy: Secondary | ICD-10-CM | POA: Diagnosis not present

## 2018-09-01 DIAGNOSIS — M9904 Segmental and somatic dysfunction of sacral region: Secondary | ICD-10-CM | POA: Diagnosis not present

## 2018-09-01 DIAGNOSIS — I4891 Unspecified atrial fibrillation: Secondary | ICD-10-CM

## 2018-09-01 DIAGNOSIS — Z86718 Personal history of other venous thrombosis and embolism: Secondary | ICD-10-CM | POA: Diagnosis not present

## 2018-09-01 DIAGNOSIS — Z7901 Long term (current) use of anticoagulants: Secondary | ICD-10-CM | POA: Diagnosis not present

## 2018-09-01 DIAGNOSIS — Z8673 Personal history of transient ischemic attack (TIA), and cerebral infarction without residual deficits: Secondary | ICD-10-CM | POA: Diagnosis not present

## 2018-09-01 DIAGNOSIS — I82502 Chronic embolism and thrombosis of unspecified deep veins of left lower extremity: Secondary | ICD-10-CM

## 2018-09-01 DIAGNOSIS — D6851 Activated protein C resistance: Secondary | ICD-10-CM | POA: Diagnosis not present

## 2018-09-01 DIAGNOSIS — I10 Essential (primary) hypertension: Secondary | ICD-10-CM | POA: Diagnosis not present

## 2018-09-01 DIAGNOSIS — G20 Parkinson's disease: Secondary | ICD-10-CM | POA: Insufficient documentation

## 2018-09-01 DIAGNOSIS — M9905 Segmental and somatic dysfunction of pelvic region: Secondary | ICD-10-CM | POA: Diagnosis not present

## 2018-09-01 DIAGNOSIS — M9903 Segmental and somatic dysfunction of lumbar region: Secondary | ICD-10-CM | POA: Diagnosis not present

## 2018-09-01 DIAGNOSIS — M5136 Other intervertebral disc degeneration, lumbar region: Secondary | ICD-10-CM | POA: Diagnosis not present

## 2018-09-01 NOTE — Telephone Encounter (Signed)
Information has been faxed to Dr Lucky Cowboy, per Dr Mike Gip she has spoken with Dr Lucky Cowboy and he has agreed to see the patient on 09/03/2018. Fax conformation confirmed.

## 2018-09-01 NOTE — Progress Notes (Signed)
Pt here for ER follow up d/t pain in left leg. Hx of DVT and per patient no new DVTs were located during visit. Patient reports pain has "subsided quite a bit".Denies any other concerns.

## 2018-09-02 ENCOUNTER — Other Ambulatory Visit (INDEPENDENT_AMBULATORY_CARE_PROVIDER_SITE_OTHER): Payer: Self-pay | Admitting: Vascular Surgery

## 2018-09-02 DIAGNOSIS — I82562 Chronic embolism and thrombosis of left calf muscular vein: Secondary | ICD-10-CM

## 2018-09-02 DIAGNOSIS — I82452 Acute embolism and thrombosis of left peroneal vein: Secondary | ICD-10-CM

## 2018-09-03 ENCOUNTER — Ambulatory Visit (INDEPENDENT_AMBULATORY_CARE_PROVIDER_SITE_OTHER): Payer: Medicare HMO | Admitting: Vascular Surgery

## 2018-09-03 ENCOUNTER — Other Ambulatory Visit: Payer: Self-pay

## 2018-09-03 ENCOUNTER — Encounter (INDEPENDENT_AMBULATORY_CARE_PROVIDER_SITE_OTHER): Payer: Medicare HMO

## 2018-09-03 ENCOUNTER — Encounter (INDEPENDENT_AMBULATORY_CARE_PROVIDER_SITE_OTHER): Payer: Self-pay | Admitting: Vascular Surgery

## 2018-09-03 VITALS — BP 163/95 | HR 57 | Resp 16 | Ht 70.0 in | Wt 246.0 lb

## 2018-09-03 DIAGNOSIS — Z7901 Long term (current) use of anticoagulants: Secondary | ICD-10-CM

## 2018-09-03 DIAGNOSIS — I82502 Chronic embolism and thrombosis of unspecified deep veins of left lower extremity: Secondary | ICD-10-CM

## 2018-09-03 DIAGNOSIS — Z79899 Other long term (current) drug therapy: Secondary | ICD-10-CM

## 2018-09-03 DIAGNOSIS — I480 Paroxysmal atrial fibrillation: Secondary | ICD-10-CM | POA: Diagnosis not present

## 2018-09-03 DIAGNOSIS — I10 Essential (primary) hypertension: Secondary | ICD-10-CM | POA: Diagnosis not present

## 2018-09-03 DIAGNOSIS — E7800 Pure hypercholesterolemia, unspecified: Secondary | ICD-10-CM | POA: Diagnosis not present

## 2018-09-03 NOTE — Patient Instructions (Signed)

## 2018-09-03 NOTE — Assessment & Plan Note (Signed)
lipid control important in reducing the progression of atherosclerotic disease. Continue statin therapy  

## 2018-09-03 NOTE — Progress Notes (Signed)
Patient ID: Thomas [name], male   DOB: [birth year], 68 y.o.   MRN: [id number]  Chief Complaint  Patient presents with  . New Patient (Initial Visit)    ref Mike Gip for     HPI Thomas [name] is a 68 y.o. male.  I am asked to see the patient by Dr. Mike Gip for evaluation of DVT of the left leg.  The patient has a previous history of DVT in the left leg several years ago.  He has had recent issues with the left leg including foot surgery which has made his balance and walking somewhat more difficult.  He then had an episode of sciatica on the left leg with pain going down the left leg.  There is some mild associated swelling as well.  With his previous history of DVT, there was concerned he could have another DVT.  He is on anticoagulation for his previous history of DVT as well as atrial fibrillation.  He takes Xarelto 20 mg daily.  He does not have any current chest pain or shortness of breath.  The leg has actually gotten some better and is not bothering him as bad the last few days as it was last weekend.  He saw his hematologist earlier this week and when the radiology report suggested a progression of his DVT with new tibial vein DVT he was referred for further evaluation and treatment.   I have independently reviewed his recent study as has my lead RVT in the office.  This is clearly a chronic DVT with no evidence of acute significant DVT in the popliteal vein or proximal.  The tibial changes were not well visualized on his previous study but they are also not acute on this study.    Past Medical History:  Diagnosis Date  . Allergy   . Arthritis    Osteoarthritis  . Arthritis of knee   . Atrial fibrillation ([place])   . DVT (deep venous thrombosis) ([facility])    L leg  . Hyperchloremia   . Hypercholesterolemia   . Hypertension   . Nephrolithiasis   . Parkinson's disease ([place])   . Plantar wart   . Pulmonary embolism ([place])   . Seasonal allergies   . TIA (transient ischemic attack)      Past Surgical History:  Procedure Laterality Date  . APPENDECTOMY    . KNEE ARTHROSCOPY WITH MENISCAL REPAIR Right 2009  . KNEE SURGERY Right   . T&A    . TONSILLECTOMY      Family History Family History  Problem Relation Age of Onset  . Parkinson's disease Father   . Coronary artery disease Mother   . Heart attack Sister   . Deep vein thrombosis Sister     Social History Social History   Tobacco Use  . Smoking status: Never Smoker  . Smokeless tobacco: Never Used  Substance Use Topics  . Alcohol use: No    Alcohol/week: 0.0 standard drinks  . Drug use: No     Allergies  Allergen Reactions  . Codeine Other (See Comments)    Reaction: Hallucinations    Current Outpatient Medications  Medication Sig Dispense Refill  . ALPRAZolam (XANAX) 0.5 MG tablet Take 1 tab prior to dermatology appointment. 5 tablet 0  . amantadine (SYMMETREL) 100 MG capsule Take 1 capsule (100 mg total) by mouth 3 (three) times daily. 270 capsule 1  . carbidopa-levodopa (SINEMET IR) 25-100 MG tablet TAKE 2 TABLETS AT 6 AM, 10 AM, AND 2 PM,  AND TAKE 1 TABLET AT 6 PM AS DIRECTED. 630 tablet 1  . cetirizine (ZYRTEC) 10 MG tablet Take 10 mg by mouth daily.    Marland Kitchen lisinopril (PRINIVIL,ZESTRIL) 30 MG tablet Take 1 tablet (30 mg total) by mouth daily. 90 tablet 1  . Melatonin 2.5 MG CAPS Take 1 capsule by mouth at bedtime as needed (for sleep).     . methocarbamol (ROBAXIN) 500 MG tablet Take 1 tablet (500 mg total) by mouth daily for 7 days. 7 tablet 0  . metoprolol succinate (TOPROL-XL) 25 MG 24 hr tablet Take 1 tablet (25 mg total) by mouth daily. 30 tablet 11  . predniSONE (STERAPRED UNI-PAK 21 TAB) 10 MG (21) TBPK tablet Take 6 tablets the first day, take 5 tablets the second day, take 4 tablets the third day, take 3 tablets the fourth day, take 2 tablets the fifth day, take 1 tablet the sixth day. 21 tablet 0  . PREVIDENT 5000 BOOSTER PLUS 1.1 % PSTE See admin instructions.    Marland Kitchen rOPINIRole (REQUIP) 2  MG tablet Take 1 tablet (2 mg total) by mouth 3 (three) times daily. 270 tablet 1  . sildenafil (REVATIO) 20 MG tablet Take 1 tablet (20 mg total) by mouth 3 (three) times daily as needed. 90 tablet 6  . simvastatin (ZOCOR) 40 MG tablet Take 1 tablet (40 mg total) by mouth daily. 90 tablet 1  . XARELTO 20 MG TABS tablet TAKE 1 TABLET EVERY DAY 90 tablet 1   No current facility-administered medications for this visit.       REVIEW OF SYSTEMS (Negative unless checked)  Constitutional: [] Weight loss  [] Fever  [] Chills Cardiac: [] Chest pain   [] Chest pressure   [] Palpitations   [] Shortness of breath when laying flat   [] Shortness of breath at rest   [] Shortness of breath with exertion. Vascular:  [] Pain in legs with walking   [x] Pain in legs at rest   [] Pain in legs when laying flat   [] Claudication   [] Pain in feet when walking  [] Pain in feet at rest  [] Pain in feet when laying flat   [] History of DVT   [x] Phlebitis   [x] Swelling in legs   [] Varicose veins   [] Non-healing ulcers Pulmonary:   [] Uses home oxygen   [] Productive cough   [] Hemoptysis   [] Wheeze  [] COPD   [] Asthma Neurologic:  [] Dizziness  [] Blackouts   [] Seizures   [] History of stroke   [] History of TIA  [] Aphasia   [] Temporary blindness   [] Dysphagia   [] Weakness or numbness in arms   [] Weakness or numbness in legs Musculoskeletal:  [x] Arthritis   [] Joint swelling   [] Joint pain   [] Low back pain Hematologic:  [] Easy bruising  [] Easy bleeding   [] Hypercoagulable state   [] Anemic  [] Hepatitis Gastrointestinal:  [] Blood in stool   [] Vomiting blood  [] Gastroesophageal reflux/heartburn   [] Abdominal pain Genitourinary:  [] Chronic kidney disease   [] Difficult urination  [] Frequent urination  [] Burning with urination   [] Hematuria Skin:  [] Rashes   [] Ulcers   [] Wounds Psychological:  [] History of anxiety   []  History of major depression.    Physical Exam BP (!) 163/95 (BP Location: Right Arm)   Pulse (!) 57   Resp 16   Ht 5\' 10"   (1.778 m)   Wt 246 lb (111.6 kg)   BMI 35.30 kg/m  Gen:  WD/WN, NAD Head: [place]/AT, No temporalis wasting.  Ear/Nose/Throat: Hearing grossly intact, nares w/o erythema or drainage, oropharynx w/o Erythema/Exudate Eyes: Conjunctiva clear,  sclera non-icteric  Neck: trachea midline.  No JVD.  Pulmonary:  Good air movement, respirations not labored, no use of accessory muscles  Cardiac: irregular Vascular:  Vessel Right Left  Radial Palpable Palpable                                   Gastrointestinal:. No masses, surgical incisions, or scars. Musculoskeletal: M/S 5/5 throughout.  Extremities without ischemic changes.  No deformity or atrophy. No significant LE edema. Neurologic: Sensation grossly intact in extremities.  Symmetrical.  Speech is fluent. Motor exam as listed above.  May be a little shuffling gait from his Parkinson's but not a lot of tremor or other signs. Psychiatric: Judgment intact, Mood & affect appropriate for pt's clinical situation. Dermatologic: No rashes or ulcers noted.  No cellulitis or open wounds.    Radiology US Venous Img Lower Unilateral Left  Result Date: 08/28/2018 CLINICAL DATA:  Left lower extremity pain. History of left lower extremity DVT. EXAM: LEFT LOWER EXTREMITY VENOUS DOPPLER ULTRASOUND TECHNIQUE: Gray-scale sonography with graded compression, as well as color Doppler and duplex ultrasound were performed to evaluate the lower extremity deep venous systems from the level of the common femoral vein and including the common femoral, femoral, profunda femoral, popliteal and calf veins including the posterior tibial, peroneal and gastrocnemius veins when visible. The superficial great saphenous vein was also interrogated. Spectral Doppler was utilized to evaluate flow at rest and with distal augmentation maneuvers in the common femoral, femoral and popliteal veins. COMPARISON:  Left lower extremity duplex 01/02/2015, 06/23/2014 FINDINGS: Contralateral  Common Femoral Vein: Respiratory phasicity is normal and symmetric with the symptomatic side. No evidence of thrombus. Normal compressibility. Common Femoral Vein: No evidence of thrombus. Normal compressibility, respiratory phasicity and response to augmentation. Saphenofemoral Junction: No evidence of thrombus. Normal compressibility and flow on color Doppler imaging. Profunda Femoral Vein: No evidence of thrombus. Normal compressibility and flow on color Doppler imaging. Femoral Vein: Previous nonocclusive thrombus has resolved. Popliteal Vein: Partially occlusive thrombus in the popliteal vein again seen. Calf Veins: Partially occlusive thrombus in the posterior tibial vein which had previously been patent. Occlusive thrombus of the peroneal vein which had previously been patent. Superficial Great Saphenous Vein: Nonocclusive thrombus in the greater saphenous vein. Venous Reflux:  None. Other Findings:  None. IMPRESSION: History of left lower extremity DVT in 2016. Partially occlusive popliteal vein thrombus is chronic. Previous femoral vein thrombus has resolved. However thrombus in the calf veins is new/progressed from 2016. These results were called by telephone at the time of interpretation on 08/28/2018 at 11:39 pm to [place] , who verbally acknowledged these results. Electronically Signed   By: Keith Rake M.D.   On: 08/28/2018 23:40    Labs Recent Results (from the past 2160 hour(s))  Microalbumin, Urine Waived     Status: Abnormal   Collection Time: 06/17/18  9:22 AM  Result Value Ref Range   Microalb, Ur Waived 30 (H) 0 - 19 mg/L   Creatinine, Urine Waived 100 10 - 300 mg/dL   Microalb/Creat Ratio <30 <30 mg/g    Comment:                              Abnormal:       30 - 300  High Abnormal:           >300   UA/M w/rflx Culture, Routine     Status: Abnormal   Collection Time: 06/17/18  9:22 AM  Result Value Ref Range   Specific Gravity, UA 1.025 1.005 -  1.030   pH, UA 6.0 5.0 - 7.5   Color, UA Yellow Yellow   Appearance Ur Clear Clear   Leukocytes, UA Negative Negative   Protein, UA Negative Negative/Trace   Glucose, UA Negative Negative   Ketones, UA Trace (A) Negative   RBC, UA Negative Negative   Bilirubin, UA Negative Negative   Urobilinogen, Ur 1.0 0.2 - 1.0 mg/dL   Nitrite, UA Negative Negative  CBC with Differential/Platelet     Status: None   Collection Time: 06/17/18  1:32 PM  Result Value Ref Range   WBC 6.6 3.4 - 10.8 x10E3/uL   RBC 4.91 4.14 - 5.80 x10E6/uL   Hemoglobin 15.7 13.0 - 17.7 g/dL   Hematocrit 44.9 37.5 - 51.0 %   MCV 91 79 - 97 fL   MCH 32.0 26.6 - 33.0 pg   MCHC 35.0 31.5 - 35.7 g/dL   RDW 11.9 11.6 - 15.4 %   Platelets 175 150 - 450 x10E3/uL   Neutrophils 77 Not Estab. %   Lymphs 14 Not Estab. %   Monocytes 6 Not Estab. %   Eos 2 Not Estab. %   Basos 1 Not Estab. %   Neutrophils Absolute 5.1 1.4 - 7.0 x10E3/uL   Lymphocytes Absolute 0.9 0.7 - 3.1 x10E3/uL   Monocytes Absolute 0.4 0.1 - 0.9 x10E3/uL   EOS (ABSOLUTE) 0.1 0.0 - 0.4 x10E3/uL   Basophils Absolute 0.1 0.0 - 0.2 x10E3/uL   Immature Granulocytes 0 Not Estab. %   Immature Grans (Abs) 0.0 0.0 - 0.1 x10E3/uL  Comprehensive metabolic panel     Status: Abnormal   Collection Time: 06/17/18  1:32 PM  Result Value Ref Range   Glucose 101 (H) 65 - 99 mg/dL   BUN 14 8 - 27 mg/dL   Creatinine, Ser 1.14 0.76 - 1.27 mg/dL   GFR calc non Af Amer 66 >59 mL/min/1.73   GFR calc Af Amer 76 >59 mL/min/1.73   BUN/Creatinine Ratio 12 10 - 24   Sodium 139 134 - 144 mmol/L   Potassium 4.7 3.5 - 5.2 mmol/L   Chloride 102 96 - 106 mmol/L   CO2 22 20 - 29 mmol/L   Calcium 9.5 8.6 - 10.2 mg/dL   Total Protein 6.9 6.0 - 8.5 g/dL   Albumin 4.4 3.8 - 4.8 g/dL   Globulin, Total 2.5 1.5 - 4.5 g/dL   Albumin/Globulin Ratio 1.8 1.2 - 2.2   Bilirubin Total 0.7 0.0 - 1.2 mg/dL   Alkaline Phosphatase 88 39 - 117 IU/L   AST 10 0 - 40 IU/L   ALT 3 0 - 44 IU/L   Lipid Panel w/o Chol/HDL Ratio     Status: None   Collection Time: 06/17/18  1:32 PM  Result Value Ref Range   Cholesterol, Total 126 100 - 199 mg/dL   Triglycerides 53 0 - 149 mg/dL   HDL 48 >39 mg/dL   VLDL Cholesterol Cal 11 5 - 40 mg/dL   LDL Calculated 67 0 - 99 mg/dL  PSA     Status: None   Collection Time: 06/17/18  1:32 PM  Result Value Ref Range   Prostate Specific Ag, Serum 1.9 0.0 - 4.0 ng/mL  Comment: Roche ECLIA methodology. According to the American Urological Association, Serum PSA should decrease and remain at undetectable levels after radical prostatectomy. The AUA defines biochemical recurrence as an initial PSA value 0.2 ng/mL or greater followed by a subsequent confirmatory PSA value 0.2 ng/mL or greater. Values obtained with different assay methods or kits cannot be used interchangeably. Results cannot be interpreted as absolute evidence of the presence or absence of malignant disease.   TSH     Status: None   Collection Time: 06/17/18  1:32 PM  Result Value Ref Range   TSH 1.410 0.450 - 4.500 uIU/mL  CBC with Differential     Status: None   Collection Time: 08/28/18 10:41 PM  Result Value Ref Range   WBC 6.2 4.0 - 10.5 K/uL   RBC 4.78 4.22 - 5.81 MIL/uL   Hemoglobin 14.5 13.0 - 17.0 g/dL   HCT 43.5 39.0 - 52.0 %   MCV 91.0 80.0 - 100.0 fL   MCH 30.3 26.0 - 34.0 pg   MCHC 33.3 30.0 - 36.0 g/dL   RDW 11.9 11.5 - 15.5 %   Platelets 154 150 - 400 K/uL   nRBC 0.0 0.0 - 0.2 %   Neutrophils Relative % 63 %   Neutro Abs 3.9 1.7 - 7.7 K/uL   Lymphocytes Relative 24 %   Lymphs Abs 1.5 0.7 - 4.0 K/uL   Monocytes Relative 8 %   Monocytes Absolute 0.5 0.1 - 1.0 K/uL   Eosinophils Relative 4 %   Eosinophils Absolute 0.2 0.0 - 0.5 K/uL   Basophils Relative 1 %   Basophils Absolute 0.1 0.0 - 0.1 K/uL   Immature Granulocytes 0 %   Abs Immature Granulocytes 0.01 0.00 - 0.07 K/uL    Comment: Performed at [facility], [address]  [place], [place] [address]  Comprehensive metabolic panel     Status: Abnormal   Collection Time: 08/28/18 10:41 PM  Result Value Ref Range   Sodium 141 135 - 145 mmol/L   Potassium 4.2 3.5 - 5.1 mmol/L   Chloride 110 98 - 111 mmol/L   CO2 25 22 - 32 mmol/L   Glucose, Bld 125 (H) 70 - 99 mg/dL   BUN 21 8 - 23 mg/dL   Creatinine, Ser 0.91 0.61 - 1.24 mg/dL   Calcium 8.9 8.9 - 10.3 mg/dL   Total Protein 6.8 6.5 - 8.1 g/dL   Albumin 4.0 3.5 - 5.0 g/dL   AST 11 (L) 15 - 41 U/L   ALT <5 0 - 44 U/L   Alkaline Phosphatase 71 38 - 126 U/L   Total Bilirubin 0.5 0.3 - 1.2 mg/dL   GFR calc non Af Amer >60 >60 mL/min   GFR calc Af Amer >60 >60 mL/min   Anion gap 6 5 - 15    Comment: Performed at [facility], [address] [place], [place] [address]    Assessment/Plan:  Hypercholesterolemia lipid control important in reducing the progression of atherosclerotic disease. Continue statin therapy   Atrial fibrillation ([facility]) On anticoagulation  Hypertension blood pressure control important in reducing the progression of atherosclerotic disease. On appropriate oral medications.   DVT (deep venous thrombosis) ([place]) I have independently reviewed his recent study as has my lead RVT in the office.  This is clearly a chronic DVT with no evidence of acute significant DVT in the popliteal vein or proximal.  The tibial changes were not well visualized on his previous study but they are also  not acute.  Even if there were more recent thrombotic issues in the tibial vein, that is not a concerning or worrisome finding in a patient already on anticoagulation.  His postphlebitic symptoms are fairly mild.  At this point, I would recommend no further therapy other than to continue anticoagulation and use compression stockings as needed to avoid postphlebitic symptoms.  I will see the patient back on an as-needed basis.      Leotis Pain 09/03/2018, 11:52 AM   This note was created with Dragon  medical transcription system.  Any errors from dictation are unintentional.

## 2018-09-03 NOTE — Assessment & Plan Note (Signed)
blood pressure control important in reducing the progression of atherosclerotic disease. On appropriate oral medications.  

## 2018-09-03 NOTE — Assessment & Plan Note (Signed)
I have independently reviewed his recent study as has my lead RVT in the office.  This is clearly a chronic DVT with no evidence of acute significant DVT in the popliteal vein or proximal.  The tibial changes were not well visualized on his previous study but they are also not acute.  Even if there were more recent thrombotic issues in the tibial vein, that is not a concerning or worrisome finding in a patient already on anticoagulation.  His postphlebitic symptoms are fairly mild.  At this point, I would recommend no further therapy other than to continue anticoagulation and use compression stockings as needed to avoid postphlebitic symptoms.  I will see the patient back on an as-needed basis.

## 2018-09-03 NOTE — Assessment & Plan Note (Signed)
On anticoagulation 

## 2018-09-07 DIAGNOSIS — B070 Plantar wart: Secondary | ICD-10-CM | POA: Diagnosis not present

## 2018-09-07 DIAGNOSIS — M79672 Pain in left foot: Secondary | ICD-10-CM | POA: Diagnosis not present

## 2018-09-07 NOTE — Progress Notes (Signed)
Virtual Visit via Video Note The purpose of this virtual visit is to provide medical care while limiting exposure to the novel coronavirus.    Consent was obtained for video visit:  Yes.   Answered questions that patient had about telehealth interaction:  Yes.   I discussed the limitations, risks, security and privacy concerns of performing an evaluation and management service by telemedicine. I also discussed with the patient that there may be a patient responsible charge related to this service. The patient expressed understanding and agreed to proceed.  Pt location: Home Physician Location: home Name of referring provider:  [name], DO I connected with Thomas [name] at patients initiation/request on 09/08/2018 at  9:00 AM EDT by video enabled telemedicine application and verified that I am speaking with the correct person using two identifiers. Pt MRN:  [id number] Pt DOB:  Sep 19, 1950 Video Participants:  Thomas [name];  Wife supplements the history   History of Present Illness:  Patient is seen today in follow-up for Parkinson's disease, accompanied by his wife who supplements the history.  Patient is on carbidopa/levodopa 25/100, 2 tablets at 6 AM/10 AM/2 PM and 1 tablet at 6 PM.  Patient is also on amantadine, 100 mg 3 times per day for dyskinesia.  Patient is on ropinirole, 2 mg 3 times per day.  Patient denies sleep attacks.  Patient denies compulsive behaviors.  No falls.  No lightheadedness or near syncope.  No hallucinations.  Records are reviewed.  He saw [facility] on May 22 for chronic DVT.  No medication changes were made.  Pt reports that he had a plantars wart removed and it affected ability to exercise.   He also has been dealing with sciatic nerve pain on the left.  He went to the ER and got treatment and it helped but he still feels a bit weak.  Wife states that when he goes to sit, he will shuffle and then nearly fall into the chair.  She really thinks  that the lack of exercise has caused all of this decline.  Hasn't done any PT. wife complains about the patient's memory.  They insisted that memory got better with the addition of amantadine.  Also complains of urinary frequency.  PREVIOUS MEDICATIONS: Sinemet, Requip and azilect   Current Outpatient Medications on File Prior to Visit  Medication Sig Dispense Refill  . ALPRAZolam (XANAX) 0.5 MG tablet Take 1 tab prior to dermatology appointment. 5 tablet 0  . amantadine (SYMMETREL) 100 MG capsule Take 1 capsule (100 mg total) by mouth 3 (three) times daily. 270 capsule 1  . carbidopa-levodopa (SINEMET IR) 25-100 MG tablet TAKE 2 TABLETS AT 6 AM, 10 AM, AND 2 PM, AND TAKE 1 TABLET AT 6 PM AS DIRECTED. 630 tablet 1  . cetirizine (ZYRTEC) 10 MG tablet Take 10 mg by mouth daily.    Marland Kitchen lisinopril (PRINIVIL,ZESTRIL) 30 MG tablet Take 1 tablet (30 mg total) by mouth daily. 90 tablet 1  . Melatonin 2.5 MG CAPS Take 1 capsule by mouth at bedtime as needed (for sleep).     . metoprolol succinate (TOPROL-XL) 25 MG 24 hr tablet Take 1 tablet (25 mg total) by mouth daily. 30 tablet 11  . predniSONE (STERAPRED UNI-PAK 21 TAB) 10 MG (21) TBPK tablet Take 6 tablets the first day, take 5 tablets the second day, take 4 tablets the third day, take 3 tablets the fourth day, take 2 tablets the fifth day, take 1 tablet the  sixth day. 21 tablet 0  . PREVIDENT 5000 BOOSTER PLUS 1.1 % PSTE See admin instructions.    Marland Kitchen rOPINIRole (REQUIP) 2 MG tablet Take 1 tablet (2 mg total) by mouth 3 (three) times daily. 270 tablet 1  . sildenafil (REVATIO) 20 MG tablet Take 1 tablet (20 mg total) by mouth 3 (three) times daily as needed. 90 tablet 6  . simvastatin (ZOCOR) 40 MG tablet Take 1 tablet (40 mg total) by mouth daily. 90 tablet 1  . XARELTO 20 MG TABS tablet TAKE 1 TABLET EVERY DAY 90 tablet 1   No current facility-administered medications on file prior to visit.      Observations/Objective:   Vitals:   09/08/18 0829   Weight: 245 lb (111.1 kg)  Height: 5\' 10"  (1.778 m)   GEN:  The patient appears stated age and is in NAD.  Neurological examination:  Orientation: The patient is alert and oriented x3. Cranial nerves: There is good facial symmetry. There is facial hypomimia.  The speech is fluent, with mild hesitation, and clear. Soft palate rises symmetrically and there is no tongue deviation. Hearing is intact to conversational tone. Motor: Strength is at least antigravity x 4.   Shoulder shrug is equal and symmetric.  There is no pronator drift.  Movement examination: Tone: unable Abnormal movements: None Coordination:  There is  decremation with RAM's, with hand opening and closing and finger taps, left greater than right Gait and Station: The patient walks well in the hall.  His wife asks him to show me how he sits on the couch, but it is well controlled.  He also gets off of the couch very well.  She insists that he is "showing off."  He is bent forward at the waist when he ambulates.  He has good arm swing.    Assessment and Plan:   1.  Idiopathic Parkinson's disease, diagnosed in October 2013 in [place] with nonmotor symptoms that preceded this for several years.  Father had PD as well.               -continue ropinirole 2 mg 3 times.  Long discussion about this medication today and how it can affect memory.  Ultimately, we decided to continue it.  I am not convinced that the patient has any dementia.             -continue carbidopa/levodopa 25/100 2 tablets at 6am/ 2 tablets at 10am/ 2 tablets at 2pm/but increase the 6 PM dosage to 2 tablets (prior on 1 tablet).  Risks, benefits, side effects and alternative therapies were discussed.  The opportunity to ask questions was given and they were answered to the best of my ability.  The patient expressed understanding and willingness to follow the outlined treatment protocols.             -Doing well on amantadine, 100 mg 3 times per day.  They insist  that cognition got better when he started on this, although think that has declined with the course of time.             -Lack of exercise due to sciatica and plantar warts has caused him to decline somewhat.  Will refer for home physical therapy.  He is homebound because of [place].  -We will have my social worker contact them with resources for online rock steady boxing. 2.  Constipation, associated with Parkinson's disease.             -Doing well  with MiraLAX 1 time every 3 weeks. 3.  Hyperreflexia but neck pain better per patient             -very hyperreflexic but doesn't wish to pursue MRI cervical spine 4.  REM behavior disorder             -This is commonly associated with PD and the patient is experiencing this.  We discussed that this can be very serious and even harmful.  We talked about medications as well as physical barriers to put in the bed (particularly soft bed rails, pillow barriers).  We talked about moving the night stand so that it is not so close to the side of the bed.  He doesn't want medication right now so encouraged him to use the bedrails.    He admits that he really does not want to use bed rails because of urinary issues. 5.  A-fib             -worse when on amiodarone but been in sinus for a long time.             -He tried to decrease metoprolol, but had more palpitations and went back up on it.  6. Memory change             -Discussed neurocognitive testing again.  They would like to be placed on the list for Dr. Melvyn Novas. 7.    Urinary frequency  -Discussed referral to [facility] urology.  They declined.  Stated that they were going to talk to their primary care about this instead.  Will let me know if they change their mind.  Follow Up Instructions:  Patient has a follow-up appointment in October and they would like to keep that.  -I discussed the assessment and treatment plan with the patient. The patient was provided an opportunity to ask questions and all were  answered. The patient agreed with the plan and demonstrated an understanding of the instructions.   The patient was advised to call back or seek an in-person evaluation if the symptoms worsen or if the condition fails to improve as anticipated.    Total Time spent in visit with the patient was: 31 minutes, of which more than 50% of the time was spent in counseling and/or coordinating care on safety.   Pt understands and agrees with the plan of care outlined.     Alonza Bogus, DO

## 2018-09-08 ENCOUNTER — Other Ambulatory Visit: Payer: Self-pay

## 2018-09-08 ENCOUNTER — Encounter: Payer: Self-pay | Admitting: Neurology

## 2018-09-08 ENCOUNTER — Telehealth (INDEPENDENT_AMBULATORY_CARE_PROVIDER_SITE_OTHER): Payer: Medicare HMO | Admitting: Neurology

## 2018-09-08 DIAGNOSIS — R413 Other amnesia: Secondary | ICD-10-CM | POA: Diagnosis not present

## 2018-09-08 DIAGNOSIS — R350 Frequency of micturition: Secondary | ICD-10-CM

## 2018-09-08 DIAGNOSIS — M9905 Segmental and somatic dysfunction of pelvic region: Secondary | ICD-10-CM | POA: Diagnosis not present

## 2018-09-08 DIAGNOSIS — M9904 Segmental and somatic dysfunction of sacral region: Secondary | ICD-10-CM | POA: Diagnosis not present

## 2018-09-08 DIAGNOSIS — G20A1 Parkinson's disease without dyskinesia, without mention of fluctuations: Secondary | ICD-10-CM

## 2018-09-08 DIAGNOSIS — M9903 Segmental and somatic dysfunction of lumbar region: Secondary | ICD-10-CM | POA: Diagnosis not present

## 2018-09-08 DIAGNOSIS — G20 Parkinson's disease: Secondary | ICD-10-CM | POA: Diagnosis not present

## 2018-09-08 DIAGNOSIS — M5136 Other intervertebral disc degeneration, lumbar region: Secondary | ICD-10-CM | POA: Diagnosis not present

## 2018-09-08 NOTE — Addendum Note (Signed)
Addended by: Ranae Plumber on: 09/08/2018 09:41 AM   Modules accepted: Orders

## 2018-09-09 ENCOUNTER — Ambulatory Visit: Payer: Medicare HMO | Admitting: Neurology

## 2018-09-10 ENCOUNTER — Encounter: Payer: Self-pay | Admitting: Family Medicine

## 2018-09-10 ENCOUNTER — Ambulatory Visit (INDEPENDENT_AMBULATORY_CARE_PROVIDER_SITE_OTHER): Payer: Medicare HMO | Admitting: Family Medicine

## 2018-09-10 ENCOUNTER — Other Ambulatory Visit: Payer: Self-pay

## 2018-09-10 VITALS — Ht 70.0 in | Wt 244.0 lb

## 2018-09-10 DIAGNOSIS — R350 Frequency of micturition: Secondary | ICD-10-CM | POA: Diagnosis not present

## 2018-09-10 DIAGNOSIS — M5432 Sciatica, left side: Secondary | ICD-10-CM

## 2018-09-10 DIAGNOSIS — N401 Enlarged prostate with lower urinary tract symptoms: Secondary | ICD-10-CM

## 2018-09-10 DIAGNOSIS — N400 Enlarged prostate without lower urinary tract symptoms: Secondary | ICD-10-CM | POA: Insufficient documentation

## 2018-09-10 HISTORY — DX: Benign prostatic hyperplasia without lower urinary tract symptoms: N40.0

## 2018-09-10 MED ORDER — PREDNISONE 10 MG PO TABS: ORAL_TABLET | ORAL | 0 refills | Status: DC

## 2018-09-10 MED ORDER — METHOCARBAMOL 500 MG PO TABS: 500.0000 mg | ORAL_TABLET | Freq: Three times a day (TID) | ORAL | 0 refills | Status: DC

## 2018-09-10 MED ORDER — TAMSULOSIN HCL 0.4 MG PO CAPS: 0.4000 mg | ORAL_CAPSULE | Freq: Every day | ORAL | 3 refills | Status: DC

## 2018-09-10 NOTE — Assessment & Plan Note (Signed)
Will start flomax and recheck 2 weeks. Call with any concerns. Continue to monitor.

## 2018-09-10 NOTE — Progress Notes (Signed)
Ht 5\' 10"  (1.778 m)   Wt 244 lb (110.7 kg)   BMI 35.01 kg/m    Subjective:    Patient ID: Thomas [name], male    DOB: [birth year], 68 y.o.   MRN: [id number]  HPI: Thomas [name] is a 68 y.o. male  Chief Complaint  Patient presents with  . Leg Pain    left leg x about 2 weeks. went to the hospital. was given robaxin and prednisone. completed .  Marland Kitchen Urinary Incontinence    x about 2-3 months   Went to the ER on 08/28/18 for severe pain of his left leg and back. He was having symptoms of sharp tingling and went from his thigh to his ankle. He awa concerned about having another DVT, so he went to the hospital. He had an [place] which showed some chronic DVTs as well as a new/progressed DVT in the calf veins when compared to 2016. He was given decadron and robaxin which seemed to help. Discharged on prednisone taper and oral robaxin. They thought this was more sciatica than DVT pain. He was advised to have close follow up with hematology about the clot. He did not have any imaging of his back done at that time.   He saw Dr. Mike Gip on 09/01/18 for follow up on his DVT. At that point, his pain was doing better and he had seen his chiropractor. Dr. Mike Gip did not think that the clot was likely new, but wanted him to see vascular surgery to see about the clot and see if they need to change his xarelto to something different. He saw him on 09/03/18.   Saw Dr. Lucky Cowboy on 09/03/18- Dr. Lucky Cowboy reviewed his [place] and found it to be a chronic DVT with no evidence of progression or acute change. He did not feel the need for any other treatment and was fine with him staying on xarelto and using compression stockings PRN.  Followed up with Neurology on 09/08/18- she referred him for home PT given his deconditioning and sciatic pain.   BACK PAIN- L sided buttock pain all the way down his leg, sore to the touch sometimes Duration: 3 weeks, better with the prednisone, but now it's back Mechanism of injury: no trauma Location:  Left buttock and leg Onset: sudden Severity: severe Quality: sharp and aching, numb and tingling Frequency: intermittent- more often than not Radiation: buttocks and L leg below the knee Aggravating factors: standing Alleviating factors: hot shower Status: better- than 2.5 weeks ago, but now still about 50% as bad as he was Treatments attempted:robaxin, prednisone   Relief with NSAIDs?: No NSAIDs Taken Nighttime pain:  yes Paresthesias / decreased sensation:  no Bowel / bladder incontinence:  no Fevers:  no Dysuria / urinary frequency:  yes  BPH BPH status: uncontrolled Satisfied with current treatment?: yes Medication side effects: no Medication compliance: not on anything Duration: chronic Nocturia: 3-4x per night Urinary frequency:yes Incomplete voiding: yes Urgency: yes Weak urinary stream: no Straining to start stream: no Dysuria: no Onset: gradual Severity: moderate  Relevant past medical, surgical, family and social history reviewed and updated as indicated. Interim medical history since our last visit reviewed. Allergies and medications reviewed and updated.  Review of Systems  Constitutional: Negative.   Respiratory: Negative.   Cardiovascular: Negative.   Gastrointestinal: Negative.   Musculoskeletal: Positive for back pain and myalgias. Negative for arthralgias, gait problem, joint swelling, neck pain and neck stiffness.  Skin: Negative.   Neurological: Positive for weakness and  numbness. Negative for dizziness, tremors, seizures, syncope, facial asymmetry, speech difficulty, light-headedness and headaches.  Psychiatric/Behavioral: Negative.     Per HPI unless specifically indicated above     Objective:    Ht 5\' 10"  (1.778 m)   Wt 244 lb (110.7 kg)   BMI 35.01 kg/m   Wt Readings from Last 3 Encounters:  09/10/18 244 lb (110.7 kg)  09/08/18 245 lb (111.1 kg)  09/03/18 246 lb (111.6 kg)    Physical Exam Vitals signs and nursing note reviewed.   Constitutional:      General: He is not in acute distress.    Appearance: Normal appearance. He is not ill-appearing, toxic-appearing or diaphoretic.  HENT:     Head: Normocephalic and atraumatic.     Right Ear: External ear normal.     Left Ear: External ear normal.     Nose: Nose normal.     Mouth/Throat:     Mouth: Mucous membranes are moist.     Pharynx: Oropharynx is clear.  Eyes:     General: No scleral icterus.       Right eye: No discharge.        Left eye: No discharge.     Conjunctiva/sclera: Conjunctivae normal.     Pupils: Pupils are equal, round, and reactive to light.  Neck:     Musculoskeletal: Normal range of motion.  Pulmonary:     Effort: Pulmonary effort is normal. No respiratory distress.     Comments: Speaking in full sentences Musculoskeletal: Normal range of motion.  Skin:    Coloration: Skin is not jaundiced or pale.     Findings: No bruising, erythema, lesion or rash.  Neurological:     Mental Status: He is alert and oriented to person, place, and time. Mental status is at baseline.  Psychiatric:        Mood and Affect: Mood normal.        Behavior: Behavior normal.        Thought Content: Thought content normal.        Judgment: Judgment normal.     Results for orders placed or performed during the hospital encounter of 08/28/18  CBC with Differential  Result Value Ref Range   WBC 6.2 4.0 - 10.5 K/uL   RBC 4.78 4.22 - 5.81 MIL/uL   Hemoglobin 14.5 13.0 - 17.0 g/dL   HCT 43.5 39.0 - 52.0 %   MCV 91.0 80.0 - 100.0 fL   MCH 30.3 26.0 - 34.0 pg   MCHC 33.3 30.0 - 36.0 g/dL   RDW 11.9 11.5 - 15.5 %   Platelets 154 150 - 400 K/uL   nRBC 0.0 0.0 - 0.2 %   Neutrophils Relative % 63 %   Neutro Abs 3.9 1.7 - 7.7 K/uL   Lymphocytes Relative 24 %   Lymphs Abs 1.5 0.7 - 4.0 K/uL   Monocytes Relative 8 %   Monocytes Absolute 0.5 0.1 - 1.0 K/uL   Eosinophils Relative 4 %   Eosinophils Absolute 0.2 0.0 - 0.5 K/uL   Basophils Relative 1 %    Basophils Absolute 0.1 0.0 - 0.1 K/uL   Immature Granulocytes 0 %   Abs Immature Granulocytes 0.01 0.00 - 0.07 K/uL  Comprehensive metabolic panel  Result Value Ref Range   Sodium 141 135 - 145 mmol/L   Potassium 4.2 3.5 - 5.1 mmol/L   Chloride 110 98 - 111 mmol/L   CO2 25 22 - 32 mmol/L   Glucose, Bld 125 (  H) 70 - 99 mg/dL   BUN 21 8 - 23 mg/dL   Creatinine, Ser 0.91 0.61 - 1.24 mg/dL   Calcium 8.9 8.9 - 10.3 mg/dL   Total Protein 6.8 6.5 - 8.1 g/dL   Albumin 4.0 3.5 - 5.0 g/dL   AST 11 (L) 15 - 41 U/L   ALT <5 0 - 44 U/L   Alkaline Phosphatase 71 38 - 126 U/L   Total Bilirubin 0.5 0.3 - 1.2 mg/dL   GFR calc non Af Amer >60 >60 mL/min   GFR calc Af Amer >60 >60 mL/min   Anion gap 6 5 - 15      Assessment & Plan:   Problem List Items Addressed This Visit      Genitourinary   BPH (benign prostatic hyperplasia)    Will start flomax and recheck 2 weeks. Call with any concerns. Continue to monitor.       Relevant Medications   tamsulosin (FLOMAX) 0.4 MG CAPS capsule    Other Visit Diagnoses    Sciatica of left side    -  Primary   Will treat with burst of prednisone and robaxin. To have home PT- if not getting better in 2 weeks, will check x-ray. Await results. Call with any concerns.    Relevant Medications   methocarbamol (ROBAXIN) 500 MG tablet       Follow up plan: Return in about 2 weeks (around 09/24/2018) for follow up back pain.

## 2018-09-10 NOTE — Patient Instructions (Signed)
Piriformis Syndrome    Piriformis syndrome is a condition that can cause pain and numbness in your buttocks and down the back of your leg. Piriformis syndrome happens when the small muscle that connects the base of your spine to your hip (piriformis muscle) presses on the nerve that runs down the back of your leg (sciatic nerve).  The piriformis muscle helps your hip rotate and helps to bring your leg back and out. It also helps shift your weight while you are walking to keep you stable. The sciatic nerve runs under or through the piriformis. Damage to the piriformis muscle can cause spasms that put pressure on the nerve below. This causes pain and discomfort while sitting and moving. The pain may feel as if it begins in the buttock and spreads (radiates) down your hip and thigh.  What are the causes?  This condition is caused by pressure on the sciatic nerve from the piriformis muscle. The piriformis muscle can get irritated with overuse, especially if other hip muscles are weak and the piriformis has to do extra work. Piriformis syndrome can also occur after an injury, like a fall onto your buttocks.  What increases the risk?  This condition is more likely to develop in:  · Women.  · People who sit for long periods of time.  · Cyclists.  · People who have weak buttocks muscles (gluteal muscles).  What are the signs or symptoms?  Pain, tingling, or numbness that starts in the buttock and runs down the back of your leg (sciatica) is the most common symptom of this condition. Your symptoms may:  · Get worse the longer you sit.  · Get worse when you walk, run, or go up on stairs.  How is this diagnosed?  This condition is diagnosed based on your symptoms, medical history, and physical exam. During this exam, your health care provider may move your leg into different positions to check for pain. He or she will also press on the muscles of your hip and buttock to see if that increases your symptoms. You may also have an  X-ray or MRI.  How is this treated?  Treatment for this condition may include:  · Stopping all activities that cause pain or make your condition worse.  · Using heat or ice to relieve pain as told by your health care provider.  · Taking medicines to reduce pain and swelling.  · Taking a muscle relaxer to release the piriformis muscle.  · Doing range-of-motion and strengthening exercises (physical therapy) as told by your health care provider.  · Massaging the affected area.  · Getting an injection of an anti-inflammatory medicine or muscle relaxer to reduce inflammation and muscle tension.  In rare cases, you may need surgery to cut the muscle and release pressure on the nerve if other treatments do not work.  Follow these instructions at home:  · Take over-the-counter and prescription medicines only as told by your health care provider.  · Do not sit for long periods. Get up and walk around every 20 minutes or as often as told by your health care provider.  · If directed, apply heat to the affected area as often as told by your health care provider. Use the heat source that your health care provider recommends, such as a moist heat pack or a heating pad.  ? Place a towel between your skin and the heat source.  ? Leave the heat on for 20-30 minutes.  ? Remove the   ice to the injured area. ? Put ice in a plastic bag. ? Place a towel between your skin and the bag. ? Leave the ice on for 20 minutes, 2-3 times a day.  Do exercises as told by your health care provider.  Return to your normal activities as told by your health care provider. Ask your health care provider what activities are safe for you.  Keep all follow-up visits as told by your health care provider. This is important. How is this prevented?  Do not sit for longer  than 20 minutes at a time. When you sit, choose padded surfaces.  Warm up and stretch before being active.  Cool down and stretch after being active.  Give your body time to rest between periods of activity.  Make sure to use equipment that fits you.  Maintain physical fitness, including: ? Strength. ? Flexibility. Contact a health care provider if:  Your pain and stiffness continue or get worse.  Your leg or hip becomes weak.  You have changes in your bowel function or bladder function. This information is not intended to replace advice given to you by your health care provider. Make sure you discuss any questions you have with your health care provider. Document Released: 03/31/2005 Document Revised: 12/04/2015 Document Reviewed: 03/13/2015 Elsevier Interactive Patient Education  2019 [employer].  Sciatica Rehab Ask your health care provider which exercises are safe for you. Do exercises exactly as told by your health care provider and adjust them as directed. It is normal to feel mild stretching, pulling, tightness, or discomfort as you do these exercises, but you should stop right away if you feel sudden pain or your pain gets worse.Do not begin these exercises until told by your health care provider. Stretching and range of motion exercises These exercises warm up your muscles and joints and improve the movement and flexibility of your hips and your back. These exercises also help to relieve pain, numbness, and tingling. Exercise A: Sciatic nerve glide 1. Sit in a chair with your head facing down toward your chest. Place your hands behind your back. Let your shoulders slump forward. 2. Slowly straighten one of your knees while you tilt your head back as if you are looking toward the ceiling. Only straighten your leg as far as you can without making your symptoms worse. 3. Hold for __________ seconds. 4. Slowly return to the starting position. 5. Repeat with your other leg.  Repeat __________ times. Complete this exercise __________ times a day. Exercise B: Knee to chest with hip adduction and internal rotation  1. Lie on your back on a firm surface with both legs straight. 2. Bend one of your knees and move it up toward your chest until you feel a gentle stretch in your lower back and buttock. Then, move your knee toward the shoulder that is on the opposite side from your leg. ? Hold your leg in this position by holding onto the front of your knee. 3. Hold for __________ seconds. 4. Slowly return to the starting position. 5. Repeat with your other leg. Repeat __________ times. Complete this exercise __________ times a day. Exercise C: Prone extension on elbows  1. Lie on your abdomen on a firm surface. A bed may be too soft for this exercise. 2. Prop yourself up on your elbows. 3. Use your arms to help lift your chest up until you feel a gentle stretch in your abdomen and your lower back. ? This will place some  of your body weight on your elbows. If this is uncomfortable, try stacking pillows under your chest. ? Your hips should stay down, against the surface that you are lying on. Keep your hip and back muscles relaxed. 4. Hold for __________ seconds. 5. Slowly relax your upper body and return to the starting position. Repeat __________ times. Complete this exercise __________ times a day. Strengthening exercises These exercises build strength and endurance in your back. Endurance is the ability to use your muscles for a long time, even after they get tired. Exercise D: Pelvic tilt 1. Lie on your back on a firm surface. Bend your knees and keep your feet flat. 2. Tense your abdominal muscles. Tip your pelvis up toward the ceiling and flatten your lower back into the floor. ? To help with this exercise, you may place a small towel under your lower back and try to push your back into the towel. 3. Hold for __________ seconds. 4. Let your muscles relax  completely before you repeat this exercise. Repeat __________ times. Complete this exercise __________ times a day. Exercise E: Alternating arm and leg raises  1. Get on your hands and knees on a firm surface. If you are on a hard floor, you may want to use padding to cushion your knees, such as an exercise mat. 2. Line up your arms and legs. Your hands should be below your shoulders, and your knees should be below your hips. 3. Lift your left leg behind you. At the same time, raise your right arm and straighten it in front of you. ? Do not lift your leg higher than your hip. ? Do not lift your arm higher than your shoulder. ? Keep your abdominal and back muscles tight. ? Keep your hips facing the ground. ? Do not arch your back. ? Keep your balance carefully, and do not hold your breath. 4. Hold for __________ seconds. 5. Slowly return to the starting position and repeat with your right leg and your left arm. Repeat __________ times. Complete this exercise __________ times a day. Posture and body mechanics  Body mechanics refers to the movements and positions of your body while you do your daily activities. Posture is part of body mechanics. Good posture and healthy body mechanics can help to relieve stress in your body's tissues and joints. Good posture means that your spine is in its natural S-curve position (your spine is neutral), your shoulders are pulled back slightly, and your head is not tipped forward. The following are general guidelines for applying improved posture and body mechanics to your everyday activities. Standing   When standing, keep your spine neutral and your feet about hip-width apart. Keep a slight bend in your knees. Your ears, shoulders, and hips should line up.  When you do a task in which you stand in one place for a long time, place one foot up on a stable object that is 2-4 inches (5-10 cm) high, such as a footstool. This helps keep your spine neutral. Sitting    When sitting, keep your spine neutral and keep your feet flat on the floor. Use a footrest, if necessary, and keep your thighs parallel to the floor. Avoid rounding your shoulders, and avoid tilting your head forward.  When working at a desk or a computer, keep your desk at a height where your hands are slightly lower than your elbows. Slide your chair under your desk so you are close enough to maintain good posture.  When working  at a computer, place your monitor at a height where you are looking straight ahead and you do not have to tilt your head forward or downward to look at the screen. Resting   When lying down and resting, avoid positions that are most painful for you.  If you have pain with activities such as sitting, bending, stooping, or squatting (flexion-based activities), lie in a position in which your body does not bend very much. For example, avoid curling up on your side with your arms and knees near your chest (fetal position).  If you have pain with activities such as standing for a long time or reaching with your arms (extension-based activities), lie with your spine in a neutral position and bend your knees slightly. Try the following positions: ? Lying on your side with a pillow between your knees. ? Lying on your back with a pillow under your knees. Lifting   When lifting objects, keep your feet at least shoulder-width apart and tighten your abdominal muscles.  Bend your knees and hips and keep your spine neutral. It is important to lift using the strength of your legs, not your back. Do not lock your knees straight out.  Always ask for help to lift heavy or awkward objects. This information is not intended to replace advice given to you by your health care provider. Make sure you discuss any questions you have with your health care provider. Document Released: 03/31/2005 Document Revised: 12/06/2015 Document Reviewed: 12/15/2014 Elsevier Interactive Patient  Education  2019 [employer].

## 2018-09-11 DIAGNOSIS — I10 Essential (primary) hypertension: Secondary | ICD-10-CM | POA: Diagnosis not present

## 2018-09-11 DIAGNOSIS — R413 Other amnesia: Secondary | ICD-10-CM | POA: Diagnosis not present

## 2018-09-11 DIAGNOSIS — G20 Parkinson's disease: Secondary | ICD-10-CM | POA: Diagnosis not present

## 2018-09-11 DIAGNOSIS — Z86718 Personal history of other venous thrombosis and embolism: Secondary | ICD-10-CM | POA: Diagnosis not present

## 2018-09-11 DIAGNOSIS — I4891 Unspecified atrial fibrillation: Secondary | ICD-10-CM | POA: Diagnosis not present

## 2018-09-11 DIAGNOSIS — R531 Weakness: Secondary | ICD-10-CM | POA: Diagnosis not present

## 2018-09-11 DIAGNOSIS — R2689 Other abnormalities of gait and mobility: Secondary | ICD-10-CM | POA: Diagnosis not present

## 2018-09-11 DIAGNOSIS — M5432 Sciatica, left side: Secondary | ICD-10-CM | POA: Diagnosis not present

## 2018-09-11 DIAGNOSIS — Z7901 Long term (current) use of anticoagulants: Secondary | ICD-10-CM | POA: Diagnosis not present

## 2018-09-13 ENCOUNTER — Telehealth: Payer: Self-pay | Admitting: Neurology

## 2018-09-13 NOTE — Telephone Encounter (Signed)
Left message with After hour service on 09-10-18 @ 4:36    Caller Bessie from [facility] states patient has changed his appt to 09-13-18

## 2018-09-13 NOTE — Telephone Encounter (Signed)
Noted Please be aware

## 2018-09-15 DIAGNOSIS — G20 Parkinson's disease: Secondary | ICD-10-CM | POA: Diagnosis not present

## 2018-09-15 DIAGNOSIS — R531 Weakness: Secondary | ICD-10-CM | POA: Diagnosis not present

## 2018-09-15 DIAGNOSIS — R2689 Other abnormalities of gait and mobility: Secondary | ICD-10-CM | POA: Diagnosis not present

## 2018-09-15 DIAGNOSIS — Z7901 Long term (current) use of anticoagulants: Secondary | ICD-10-CM | POA: Diagnosis not present

## 2018-09-15 DIAGNOSIS — Z86718 Personal history of other venous thrombosis and embolism: Secondary | ICD-10-CM | POA: Diagnosis not present

## 2018-09-15 DIAGNOSIS — M5432 Sciatica, left side: Secondary | ICD-10-CM | POA: Diagnosis not present

## 2018-09-15 DIAGNOSIS — I10 Essential (primary) hypertension: Secondary | ICD-10-CM | POA: Diagnosis not present

## 2018-09-15 DIAGNOSIS — R413 Other amnesia: Secondary | ICD-10-CM | POA: Diagnosis not present

## 2018-09-15 DIAGNOSIS — I4891 Unspecified atrial fibrillation: Secondary | ICD-10-CM | POA: Diagnosis not present

## 2018-09-16 ENCOUNTER — Telehealth: Payer: Self-pay | Admitting: Neurology

## 2018-09-16 NOTE — Telephone Encounter (Signed)
Noted See be aware

## 2018-09-16 NOTE — Telephone Encounter (Signed)
PT calling in from [facility] needing verbal orders for PT 2xs a week for 2 weeks and then 1xs a week for 2 weeks. Also needing Speech therapy Eval. Please call him back with this at [phone]. Thanks!

## 2018-09-16 NOTE — Telephone Encounter (Signed)
Pt's wife [name] wanted to let Dr. Carles Collet know that pt's feet evaluation was moved to next week.

## 2018-09-16 NOTE — Telephone Encounter (Signed)
[employer] verbal given Please co-sign this is ok

## 2018-09-17 DIAGNOSIS — I4891 Unspecified atrial fibrillation: Secondary | ICD-10-CM | POA: Diagnosis not present

## 2018-09-17 DIAGNOSIS — I10 Essential (primary) hypertension: Secondary | ICD-10-CM | POA: Diagnosis not present

## 2018-09-17 DIAGNOSIS — R531 Weakness: Secondary | ICD-10-CM | POA: Diagnosis not present

## 2018-09-17 DIAGNOSIS — Z86718 Personal history of other venous thrombosis and embolism: Secondary | ICD-10-CM | POA: Diagnosis not present

## 2018-09-17 DIAGNOSIS — M5432 Sciatica, left side: Secondary | ICD-10-CM | POA: Diagnosis not present

## 2018-09-17 DIAGNOSIS — R413 Other amnesia: Secondary | ICD-10-CM | POA: Diagnosis not present

## 2018-09-17 DIAGNOSIS — G20 Parkinson's disease: Secondary | ICD-10-CM | POA: Diagnosis not present

## 2018-09-17 DIAGNOSIS — Z7901 Long term (current) use of anticoagulants: Secondary | ICD-10-CM | POA: Diagnosis not present

## 2018-09-17 DIAGNOSIS — R2689 Other abnormalities of gait and mobility: Secondary | ICD-10-CM | POA: Diagnosis not present

## 2018-09-20 ENCOUNTER — Telehealth: Payer: Self-pay | Admitting: Neurology

## 2018-09-20 NOTE — Telephone Encounter (Signed)
Called spoke with spouse she states that [facility] called to set up visit but the times/days she offered didn't work her them once she tried to work out another time with ST at [employer] [facility] she was very Passenger transport manager and acted like she didn't care if she came out or not.   Patient have not had one ST visit and have not heard from her since she last spoke with her about scheduling visits. Please be aware  She states that PT is going great

## 2018-09-20 NOTE — Telephone Encounter (Signed)
PT is coming in but not Speech Therapy. She said that the Tech responsible for this is very nonchalant and not scheduling them.  Please call her back at [phone]. Thanks!

## 2018-09-21 DIAGNOSIS — Z7901 Long term (current) use of anticoagulants: Secondary | ICD-10-CM | POA: Diagnosis not present

## 2018-09-21 DIAGNOSIS — I10 Essential (primary) hypertension: Secondary | ICD-10-CM | POA: Diagnosis not present

## 2018-09-21 DIAGNOSIS — R413 Other amnesia: Secondary | ICD-10-CM | POA: Diagnosis not present

## 2018-09-21 DIAGNOSIS — R2689 Other abnormalities of gait and mobility: Secondary | ICD-10-CM | POA: Diagnosis not present

## 2018-09-21 DIAGNOSIS — R531 Weakness: Secondary | ICD-10-CM | POA: Diagnosis not present

## 2018-09-21 DIAGNOSIS — M5432 Sciatica, left side: Secondary | ICD-10-CM | POA: Diagnosis not present

## 2018-09-21 DIAGNOSIS — Z86718 Personal history of other venous thrombosis and embolism: Secondary | ICD-10-CM | POA: Diagnosis not present

## 2018-09-21 DIAGNOSIS — I4891 Unspecified atrial fibrillation: Secondary | ICD-10-CM | POA: Diagnosis not present

## 2018-09-21 DIAGNOSIS — G20 Parkinson's disease: Secondary | ICD-10-CM | POA: Diagnosis not present

## 2018-09-24 DIAGNOSIS — Z86718 Personal history of other venous thrombosis and embolism: Secondary | ICD-10-CM | POA: Diagnosis not present

## 2018-09-24 DIAGNOSIS — R413 Other amnesia: Secondary | ICD-10-CM | POA: Diagnosis not present

## 2018-09-24 DIAGNOSIS — Z7901 Long term (current) use of anticoagulants: Secondary | ICD-10-CM | POA: Diagnosis not present

## 2018-09-24 DIAGNOSIS — I10 Essential (primary) hypertension: Secondary | ICD-10-CM | POA: Diagnosis not present

## 2018-09-24 DIAGNOSIS — R531 Weakness: Secondary | ICD-10-CM | POA: Diagnosis not present

## 2018-09-24 DIAGNOSIS — I4891 Unspecified atrial fibrillation: Secondary | ICD-10-CM | POA: Diagnosis not present

## 2018-09-24 DIAGNOSIS — M5432 Sciatica, left side: Secondary | ICD-10-CM | POA: Diagnosis not present

## 2018-09-24 DIAGNOSIS — G20 Parkinson's disease: Secondary | ICD-10-CM | POA: Diagnosis not present

## 2018-09-24 DIAGNOSIS — R2689 Other abnormalities of gait and mobility: Secondary | ICD-10-CM | POA: Diagnosis not present

## 2018-09-25 ENCOUNTER — Other Ambulatory Visit: Payer: Self-pay | Admitting: Family Medicine

## 2018-09-28 DIAGNOSIS — Z86718 Personal history of other venous thrombosis and embolism: Secondary | ICD-10-CM | POA: Diagnosis not present

## 2018-09-28 DIAGNOSIS — G20 Parkinson's disease: Secondary | ICD-10-CM | POA: Diagnosis not present

## 2018-09-28 DIAGNOSIS — R413 Other amnesia: Secondary | ICD-10-CM | POA: Diagnosis not present

## 2018-09-28 DIAGNOSIS — R2689 Other abnormalities of gait and mobility: Secondary | ICD-10-CM | POA: Diagnosis not present

## 2018-09-28 DIAGNOSIS — Z7901 Long term (current) use of anticoagulants: Secondary | ICD-10-CM | POA: Diagnosis not present

## 2018-09-28 DIAGNOSIS — I4891 Unspecified atrial fibrillation: Secondary | ICD-10-CM | POA: Diagnosis not present

## 2018-09-28 DIAGNOSIS — I10 Essential (primary) hypertension: Secondary | ICD-10-CM | POA: Diagnosis not present

## 2018-09-28 DIAGNOSIS — R531 Weakness: Secondary | ICD-10-CM | POA: Diagnosis not present

## 2018-09-28 DIAGNOSIS — M5432 Sciatica, left side: Secondary | ICD-10-CM | POA: Diagnosis not present

## 2018-09-29 ENCOUNTER — Telehealth: Payer: Self-pay | Admitting: Family Medicine

## 2018-09-29 NOTE — Telephone Encounter (Signed)
Called pt to go over script screening for Covid-19 no answer left vm °

## 2018-09-30 ENCOUNTER — Other Ambulatory Visit: Payer: Self-pay

## 2018-09-30 ENCOUNTER — Ambulatory Visit (INDEPENDENT_AMBULATORY_CARE_PROVIDER_SITE_OTHER): Payer: Medicare HMO | Admitting: Family Medicine

## 2018-09-30 ENCOUNTER — Encounter: Payer: Self-pay | Admitting: Family Medicine

## 2018-09-30 VITALS — BP 152/93 | HR 55 | Temp 98.1°F | Ht 70.0 in | Wt 248.0 lb

## 2018-09-30 DIAGNOSIS — G4701 Insomnia due to medical condition: Secondary | ICD-10-CM

## 2018-09-30 DIAGNOSIS — M5432 Sciatica, left side: Secondary | ICD-10-CM

## 2018-09-30 DIAGNOSIS — N401 Enlarged prostate with lower urinary tract symptoms: Secondary | ICD-10-CM

## 2018-09-30 DIAGNOSIS — I10 Essential (primary) hypertension: Secondary | ICD-10-CM

## 2018-09-30 DIAGNOSIS — R350 Frequency of micturition: Secondary | ICD-10-CM

## 2018-09-30 HISTORY — DX: Insomnia due to medical condition: G47.01

## 2018-09-30 MED ORDER — TRAZODONE HCL 50 MG PO TABS: 25.0000 mg | ORAL_TABLET | Freq: Every evening | ORAL | 3 refills | Status: DC | PRN

## 2018-09-30 MED ORDER — TAMSULOSIN HCL 0.4 MG PO CAPS: 0.4000 mg | ORAL_CAPSULE | Freq: Every day | ORAL | 1 refills | Status: DC

## 2018-09-30 MED ORDER — SILDENAFIL CITRATE 20 MG PO TABS: 20.0000 mg | ORAL_TABLET | Freq: Three times a day (TID) | ORAL | 6 refills | Status: DC | PRN

## 2018-09-30 NOTE — Assessment & Plan Note (Signed)
Labile blood pressure. Continue to monitor at home. DASH diet. Call with any concerns.

## 2018-09-30 NOTE — Progress Notes (Signed)
BP (!) 152/93   Pulse (!) 55   Temp 98.1 F (36.7 C) (Oral)   Ht 5\' 10"  (1.778 m)   Wt 248 lb (112.5 kg)   SpO2 98%   BMI 35.58 kg/m    Subjective:    Patient ID: Thomas [name], male    DOB: 1951/03/14, 68 y.o.   MRN: [id number]  HPI: Thomas [name] is a 68 y.o. male  Chief Complaint  Patient presents with  . Back Pain    f/u  . Urinary Incontinence    concerns   Had home PT. Feeling about 90% better on his back. Doing significantly better.   BPH BPH status: better Satisfied with current treatment?: yes Medication side effects: no Medication compliance: excellent compliance Duration: chronic Nocturia: 2-3x per night Urinary frequency:yes Incomplete voiding: no Urgency: no Weak urinary stream: no Straining to start stream: no Dysuria: no Onset: gradual Severity: mild   INSOMNIA Duration: 4-5 months Satisfied with sleep quality: no Difficulty falling asleep: yes Difficulty staying asleep: yes Waking a few hours after sleep onset: yes Early morning awakenings: yes Daytime hypersomnolence: yes Wakes feeling refreshed: yes Good sleep hygiene: yes Apnea: no Snoring: no Depressed/anxious mood: no Recent stress: yes Restless legs/nocturnal leg cramps: no Chronic pain/arthritis: no History of sleep study: no Treatments attempted: melatonin- helps a little bit, simple sleep   Relevant past medical, surgical, family and social history reviewed and updated as indicated. Interim medical history since our last visit reviewed. Allergies and medications reviewed and updated.  Review of Systems  Constitutional: Negative.   Respiratory: Negative.   Cardiovascular: Negative.   Genitourinary: Negative.   Musculoskeletal: Positive for back pain, gait problem and myalgias. Negative for arthralgias, joint swelling, neck pain and neck stiffness.  Skin: Negative.   Neurological: Negative for dizziness, tremors, seizures, syncope, facial asymmetry, speech difficulty,  weakness, light-headedness, numbness and headaches.  Hematological: Negative.   Psychiatric/Behavioral: Positive for sleep disturbance. Negative for agitation, behavioral problems, confusion, decreased concentration, dysphoric mood, hallucinations, self-injury and suicidal ideas. The patient is not nervous/anxious and is not hyperactive.     Per HPI unless specifically indicated above     Objective:    BP (!) 152/93   Pulse (!) 55   Temp 98.1 F (36.7 C) (Oral)   Ht 5\' 10"  (1.778 m)   Wt 248 lb (112.5 kg)   SpO2 98%   BMI 35.58 kg/m   Wt Readings from Last 3 Encounters:  09/30/18 248 lb (112.5 kg)  09/10/18 244 lb (110.7 kg)  09/08/18 245 lb (111.1 kg)    Physical Exam Vitals signs and nursing note reviewed.  Constitutional:      General: He is not in acute distress.    Appearance: Normal appearance. He is not ill-appearing, toxic-appearing or diaphoretic.  HENT:     Head: Normocephalic and atraumatic.     Right Ear: External ear normal.     Left Ear: External ear normal.     Nose: Nose normal.     Mouth/Throat:     Mouth: Mucous membranes are moist.     Pharynx: Oropharynx is clear.  Eyes:     General: No scleral icterus.       Right eye: No discharge.        Left eye: No discharge.     Extraocular Movements: Extraocular movements intact.     Conjunctiva/sclera: Conjunctivae normal.     Pupils: Pupils are equal, round, and reactive to light.  Neck:  Musculoskeletal: Normal range of motion and neck supple.  Cardiovascular:     Rate and Rhythm: Normal rate and regular rhythm.     Pulses: Normal pulses.     Heart sounds: Normal heart sounds. No murmur. No friction rub. No gallop.   Pulmonary:     Effort: Pulmonary effort is normal. No respiratory distress.     Breath sounds: Normal breath sounds. No stridor. No wheezing, rhonchi or rales.  Chest:     Chest wall: No tenderness.  Musculoskeletal: Normal range of motion.  Skin:    General: Skin is warm and dry.      Capillary Refill: Capillary refill takes less than 2 seconds.     Coloration: Skin is not jaundiced or pale.     Findings: No bruising, erythema, lesion or rash.  Neurological:     General: No focal deficit present.     Mental Status: He is alert and oriented to person, place, and time. Mental status is at baseline.  Psychiatric:        Mood and Affect: Mood normal.        Behavior: Behavior normal.        Thought Content: Thought content normal.        Judgment: Judgment normal.     Results for orders placed or performed during the hospital encounter of 08/28/18  CBC with Differential  Result Value Ref Range   WBC 6.2 4.0 - 10.5 K/uL   RBC 4.78 4.22 - 5.81 MIL/uL   Hemoglobin 14.5 13.0 - 17.0 g/dL   HCT 43.5 39.0 - 52.0 %   MCV 91.0 80.0 - 100.0 fL   MCH 30.3 26.0 - 34.0 pg   MCHC 33.3 30.0 - 36.0 g/dL   RDW 11.9 11.5 - 15.5 %   Platelets 154 150 - 400 K/uL   nRBC 0.0 0.0 - 0.2 %   Neutrophils Relative % 63 %   Neutro Abs 3.9 1.7 - 7.7 K/uL   Lymphocytes Relative 24 %   Lymphs Abs 1.5 0.7 - 4.0 K/uL   Monocytes Relative 8 %   Monocytes Absolute 0.5 0.1 - 1.0 K/uL   Eosinophils Relative 4 %   Eosinophils Absolute 0.2 0.0 - 0.5 K/uL   Basophils Relative 1 %   Basophils Absolute 0.1 0.0 - 0.1 K/uL   Immature Granulocytes 0 %   Abs Immature Granulocytes 0.01 0.00 - 0.07 K/uL  Comprehensive metabolic panel  Result Value Ref Range   Sodium 141 135 - 145 mmol/L   Potassium 4.2 3.5 - 5.1 mmol/L   Chloride 110 98 - 111 mmol/L   CO2 25 22 - 32 mmol/L   Glucose, Bld 125 (H) 70 - 99 mg/dL   BUN 21 8 - 23 mg/dL   Creatinine, Ser 0.91 0.61 - 1.24 mg/dL   Calcium 8.9 8.9 - 10.3 mg/dL   Total Protein 6.8 6.5 - 8.1 g/dL   Albumin 4.0 3.5 - 5.0 g/dL   AST 11 (L) 15 - 41 U/L   ALT <5 0 - 44 U/L   Alkaline Phosphatase 71 38 - 126 U/L   Total Bilirubin 0.5 0.3 - 1.2 mg/dL   GFR calc non Af Amer >60 >60 mL/min   GFR calc Af Amer >60 >60 mL/min   Anion gap 6 5 - 15       Assessment & Plan:   Problem List Items Addressed This Visit      Cardiovascular and Mediastinum   Hypertension  Labile blood pressure. Continue to monitor at home. DASH diet. Call with any concerns.       Relevant Medications   sildenafil (REVATIO) 20 MG tablet     Genitourinary   BPH (benign prostatic hyperplasia) - Primary    Doing much better. Stable on current regimen. Continue to monitor. Call with any concerns. Refills given today.      Relevant Medications   tamsulosin (FLOMAX) 0.4 MG CAPS capsule     Other   Insomnia due to medical condition    Will start trazodone. Call with any concerns. Recheck 1 month. Continue to monitor.        Other Visit Diagnoses    Sciatica of left side       90% better. Doing much better. Continue stretches. Call with any concerns.   Relevant Medications   traZODone (DESYREL) 50 MG tablet       Follow up plan: Return in about 4 weeks (around 10/28/2018).

## 2018-09-30 NOTE — Assessment & Plan Note (Signed)
Will start trazodone. Call with any concerns. Recheck 1 month. Continue to monitor.

## 2018-09-30 NOTE — Assessment & Plan Note (Signed)
Doing much better. Stable on current regimen. Continue to monitor. Call with any concerns. Refills given today.

## 2018-10-01 DIAGNOSIS — B070 Plantar wart: Secondary | ICD-10-CM | POA: Diagnosis not present

## 2018-10-01 DIAGNOSIS — M79672 Pain in left foot: Secondary | ICD-10-CM | POA: Diagnosis not present

## 2018-10-04 DIAGNOSIS — Z86718 Personal history of other venous thrombosis and embolism: Secondary | ICD-10-CM | POA: Diagnosis not present

## 2018-10-04 DIAGNOSIS — R531 Weakness: Secondary | ICD-10-CM | POA: Diagnosis not present

## 2018-10-04 DIAGNOSIS — R413 Other amnesia: Secondary | ICD-10-CM | POA: Diagnosis not present

## 2018-10-04 DIAGNOSIS — I10 Essential (primary) hypertension: Secondary | ICD-10-CM | POA: Diagnosis not present

## 2018-10-04 DIAGNOSIS — M5432 Sciatica, left side: Secondary | ICD-10-CM | POA: Diagnosis not present

## 2018-10-04 DIAGNOSIS — Z7901 Long term (current) use of anticoagulants: Secondary | ICD-10-CM | POA: Diagnosis not present

## 2018-10-04 DIAGNOSIS — G20 Parkinson's disease: Secondary | ICD-10-CM | POA: Diagnosis not present

## 2018-10-04 DIAGNOSIS — R2689 Other abnormalities of gait and mobility: Secondary | ICD-10-CM | POA: Diagnosis not present

## 2018-10-04 DIAGNOSIS — I4891 Unspecified atrial fibrillation: Secondary | ICD-10-CM | POA: Diagnosis not present

## 2018-10-05 DIAGNOSIS — I10 Essential (primary) hypertension: Secondary | ICD-10-CM | POA: Diagnosis not present

## 2018-10-05 DIAGNOSIS — Z7901 Long term (current) use of anticoagulants: Secondary | ICD-10-CM | POA: Diagnosis not present

## 2018-10-05 DIAGNOSIS — R2689 Other abnormalities of gait and mobility: Secondary | ICD-10-CM | POA: Diagnosis not present

## 2018-10-05 DIAGNOSIS — M5432 Sciatica, left side: Secondary | ICD-10-CM | POA: Diagnosis not present

## 2018-10-05 DIAGNOSIS — I4891 Unspecified atrial fibrillation: Secondary | ICD-10-CM | POA: Diagnosis not present

## 2018-10-05 DIAGNOSIS — R413 Other amnesia: Secondary | ICD-10-CM | POA: Diagnosis not present

## 2018-10-05 DIAGNOSIS — G20 Parkinson's disease: Secondary | ICD-10-CM | POA: Diagnosis not present

## 2018-10-05 DIAGNOSIS — Z86718 Personal history of other venous thrombosis and embolism: Secondary | ICD-10-CM | POA: Diagnosis not present

## 2018-10-05 DIAGNOSIS — R531 Weakness: Secondary | ICD-10-CM | POA: Diagnosis not present

## 2018-10-12 DIAGNOSIS — G20 Parkinson's disease: Secondary | ICD-10-CM | POA: Diagnosis not present

## 2018-10-12 DIAGNOSIS — R413 Other amnesia: Secondary | ICD-10-CM | POA: Diagnosis not present

## 2018-10-12 DIAGNOSIS — M5432 Sciatica, left side: Secondary | ICD-10-CM | POA: Diagnosis not present

## 2018-10-12 DIAGNOSIS — I10 Essential (primary) hypertension: Secondary | ICD-10-CM | POA: Diagnosis not present

## 2018-10-12 DIAGNOSIS — I4891 Unspecified atrial fibrillation: Secondary | ICD-10-CM | POA: Diagnosis not present

## 2018-10-12 DIAGNOSIS — Z86718 Personal history of other venous thrombosis and embolism: Secondary | ICD-10-CM | POA: Diagnosis not present

## 2018-10-12 DIAGNOSIS — R2689 Other abnormalities of gait and mobility: Secondary | ICD-10-CM | POA: Diagnosis not present

## 2018-10-12 DIAGNOSIS — R531 Weakness: Secondary | ICD-10-CM | POA: Diagnosis not present

## 2018-10-12 DIAGNOSIS — Z7901 Long term (current) use of anticoagulants: Secondary | ICD-10-CM | POA: Diagnosis not present

## 2018-10-19 ENCOUNTER — Telehealth: Payer: Self-pay | Admitting: Clinical

## 2018-10-19 NOTE — Telephone Encounter (Signed)
LCSW spoke w pt wife, pt prefers in-person exercise and is waiting for [employer] to re-open. Provided info re online RSB and encouraged them to call if they change their mind

## 2018-10-27 ENCOUNTER — Other Ambulatory Visit: Payer: Self-pay | Admitting: Family Medicine

## 2018-10-28 NOTE — Telephone Encounter (Signed)
Forwarding medication refills to PCP for review. 

## 2018-10-31 NOTE — Progress Notes (Signed)
BP 132/82   Pulse (!) 59   Temp 98.2 F (36.8 C) (Oral)   Ht 5\' 10"  (1.778 m)   Wt 245 lb (111.1 kg)   SpO2 98%   BMI 35.15 kg/m    Subjective:    Patient ID: Thomas [name], male    DOB: 07-09-1950, 68 y.o.   MRN: [id number]  HPI: Thomas [name] is a 68 y.o. male  Chief Complaint  Patient presents with  . Follow-up  . Insomnia    Medication helps pt fall asleep. Still wakes up few times a night, but is able to go back to sleep fairly well   INSOMNIA- Taking about 10-20 minutes to fall asleep, out for 6.5-7 hours, waking up 2+ times, but able to fall back to sleep.  Duration: months Satisfied with sleep quality: yes Difficulty falling asleep: no Difficulty staying asleep: no Waking a few hours after sleep onset: no Early morning awakenings: no Daytime hypersomnolence: no Wakes feeling refreshed: yes Good sleep hygiene: no Apnea: no Snoring: no Depressed/anxious mood: no Recent stress: yes Restless legs/nocturnal leg cramps: no Chronic pain/arthritis: no History of sleep study: no Treatments attempted: trazodone, melatonin, uinsom and benadryl   Relevant past medical, surgical, family and social history reviewed and updated as indicated. Interim medical history since our last visit reviewed. Allergies and medications reviewed and updated.  Review of Systems  Constitutional: Negative.   Respiratory: Negative.   Cardiovascular: Negative.   Gastrointestinal: Negative.   Neurological: Positive for tremors and weakness. Negative for dizziness, seizures, syncope, facial asymmetry, speech difficulty, light-headedness, numbness and headaches.  Psychiatric/Behavioral: Negative.     Per HPI unless specifically indicated above     Objective:    BP 132/82   Pulse (!) 59   Temp 98.2 F (36.8 C) (Oral)   Ht 5\' 10"  (1.778 m)   Wt 245 lb (111.1 kg)   SpO2 98%   BMI 35.15 kg/m   Wt Readings from Last 3 Encounters:  11/01/18 245 lb (111.1 kg)  09/30/18 248 lb (112.5  kg)  09/10/18 244 lb (110.7 kg)    Physical Exam Vitals signs and nursing note reviewed.  Constitutional:      General: He is not in acute distress.    Appearance: Normal appearance. He is not ill-appearing, toxic-appearing or diaphoretic.  HENT:     Head: Normocephalic and atraumatic.     Right Ear: External ear normal.     Left Ear: External ear normal.     Nose: Nose normal.     Mouth/Throat:     Mouth: Mucous membranes are moist.     Pharynx: Oropharynx is clear.  Eyes:     General: No scleral icterus.       Right eye: No discharge.        Left eye: No discharge.     Extraocular Movements: Extraocular movements intact.     Conjunctiva/sclera: Conjunctivae normal.     Pupils: Pupils are equal, round, and reactive to light.  Neck:     Musculoskeletal: Normal range of motion and neck [name].  Cardiovascular:     Rate and Rhythm: Normal rate and regular rhythm.     Pulses: Normal pulses.     Heart sounds: Normal heart sounds. No murmur. No friction rub. No gallop.   Pulmonary:     Effort: Pulmonary effort is normal. No respiratory distress.     Breath sounds: Normal breath sounds. No stridor. No wheezing, rhonchi or rales.  Chest:  Chest wall: No tenderness.  Musculoskeletal: Normal range of motion.  Skin:    General: Skin is warm and dry.     Capillary Refill: Capillary refill takes less than 2 seconds.     Coloration: Skin is not jaundiced or pale.     Findings: No bruising, erythema, lesion or rash.  Neurological:     General: No focal deficit present.     Mental Status: He is alert and oriented to person, place, and time. Mental status is at baseline.  Psychiatric:        Mood and Affect: Mood normal.        Behavior: Behavior normal.        Thought Content: Thought content normal.        Judgment: Judgment normal.     Results for orders placed or performed during the hospital encounter of 08/28/18  CBC with Differential  Result Value Ref Range   WBC 6.2  4.0 - 10.5 K/uL   RBC 4.78 4.22 - 5.81 MIL/uL   Hemoglobin 14.5 13.0 - 17.0 g/dL   HCT 43.5 39.0 - 52.0 %   MCV 91.0 80.0 - 100.0 fL   MCH 30.3 26.0 - 34.0 pg   MCHC 33.3 30.0 - 36.0 g/dL   RDW 11.9 11.5 - 15.5 %   Platelets 154 150 - 400 K/uL   nRBC 0.0 0.0 - 0.2 %   Neutrophils Relative % 63 %   Neutro Abs 3.9 1.7 - 7.7 K/uL   Lymphocytes Relative 24 %   Lymphs Abs 1.5 0.7 - 4.0 K/uL   Monocytes Relative 8 %   Monocytes Absolute 0.5 0.1 - 1.0 K/uL   Eosinophils Relative 4 %   Eosinophils Absolute 0.2 0.0 - 0.5 K/uL   Basophils Relative 1 %   Basophils Absolute 0.1 0.0 - 0.1 K/uL   Immature Granulocytes 0 %   Abs Immature Granulocytes 0.01 0.00 - 0.07 K/uL  Comprehensive metabolic panel  Result Value Ref Range   Sodium 141 135 - 145 mmol/L   Potassium 4.2 3.5 - 5.1 mmol/L   Chloride 110 98 - 111 mmol/L   CO2 25 22 - 32 mmol/L   Glucose, Bld 125 (H) 70 - 99 mg/dL   BUN 21 8 - 23 mg/dL   Creatinine, Ser 0.91 0.61 - 1.24 mg/dL   Calcium 8.9 8.9 - 10.3 mg/dL   Total Protein 6.8 6.5 - 8.1 g/dL   Albumin 4.0 3.5 - 5.0 g/dL   AST 11 (L) 15 - 41 U/L   ALT <5 0 - 44 U/L   Alkaline Phosphatase 71 38 - 126 U/L   Total Bilirubin 0.5 0.3 - 1.2 mg/dL   GFR calc non Af Amer >60 >60 mL/min   GFR calc Af Amer >60 >60 mL/min   Anion gap 6 5 - 15      Assessment & Plan:   Problem List Items Addressed This Visit      Other   Insomnia due to medical condition - Primary    Doing well on trazodone. No concerns. Continue current regimen. Continue to monitor. Refills given today. Call with any concerns.           Follow up plan: Return Early September, for 6 month follow up.

## 2018-11-01 ENCOUNTER — Other Ambulatory Visit: Payer: Self-pay

## 2018-11-01 ENCOUNTER — Encounter: Payer: Self-pay | Admitting: Family Medicine

## 2018-11-01 ENCOUNTER — Ambulatory Visit (INDEPENDENT_AMBULATORY_CARE_PROVIDER_SITE_OTHER): Payer: Medicare HMO | Admitting: Family Medicine

## 2018-11-01 VITALS — BP 132/82 | HR 59 | Temp 98.2°F | Ht 70.0 in | Wt 245.0 lb

## 2018-11-01 DIAGNOSIS — G4701 Insomnia due to medical condition: Secondary | ICD-10-CM | POA: Diagnosis not present

## 2018-11-01 MED ORDER — TRAZODONE HCL 50 MG PO TABS: 25.0000 mg | ORAL_TABLET | Freq: Every evening | ORAL | 1 refills | Status: DC | PRN

## 2018-11-01 NOTE — Assessment & Plan Note (Signed)
Doing well on trazodone. No concerns. Continue current regimen. Continue to monitor. Refills given today. Call with any concerns.

## 2018-11-05 ENCOUNTER — Other Ambulatory Visit: Payer: Self-pay | Admitting: Neurology

## 2018-11-08 NOTE — Telephone Encounter (Signed)
Requested Prescriptions   Pending Prescriptions Disp Refills  . amantadine (SYMMETREL) 100 MG capsule [Pharmacy Med Name: AMANTADINE HCL 100 MG Capsule] 270 capsule 1    Sig: TAKE 1 CAPSULE (100 MG TOTAL) BY MOUTH 3 (THREE) TIMES DAILY.   Rx last filled:02/09/18 #270 1 refills  Pt last seen: 09/08/18   Follow up appt scheduled:01/27/19

## 2018-11-10 ENCOUNTER — Other Ambulatory Visit: Payer: Self-pay | Admitting: Neurology

## 2018-11-11 NOTE — Telephone Encounter (Signed)
Requested Prescriptions   Pending Prescriptions Disp Refills  . carbidopa-levodopa (SINEMET IR) 25-100 MG tablet [Pharmacy Med Name: CARBIDOPA/LEVODOPA 25-100 MG Tablet] 630 tablet 1    Sig: TAKE 2 TABLETS AT 6AM, 10AM, AND 2PM, AND TAKE 1 TABLET AT 6PM AS DIRECTED.   Rx last filled: 07/02/18 #630 1 refills  Pt last seen:5/527/20   Follow up appt scheduled:01/27/19

## 2018-11-15 ENCOUNTER — Other Ambulatory Visit: Payer: Self-pay | Admitting: Family Medicine

## 2018-11-15 MED ORDER — SIMVASTATIN 40 MG PO TABS: 40.0000 mg | ORAL_TABLET | Freq: Every day | ORAL | 1 refills | Status: DC

## 2018-11-15 NOTE — Telephone Encounter (Signed)
Copied from [place] [phone]. Topic: Quick Communication - Rx Refill/Question >> Nov 15, 2018 11:25 AM Nils Flack wrote: Medication: simvastatin (ZOCOR) 40 MG tablet  Has the patient contacted their pharmacy? Yes.   (Agent: If no, request that the patient contact the pharmacy for the refill.) (Agent: If yes, when and what did the pharmacy advise?)  Preferred Pharmacy (with phone number or street name): [facility] - phone is [phone] Thomas [name] has contacted office  Does not need lisinopril   Agent: Please be advised that RX refills may take up to 3 business days. We ask that you follow-up with your pharmacy.

## 2018-11-15 NOTE — Telephone Encounter (Signed)
Routing to provider  

## 2018-11-29 ENCOUNTER — Other Ambulatory Visit: Payer: Self-pay

## 2018-11-29 ENCOUNTER — Ambulatory Visit (INDEPENDENT_AMBULATORY_CARE_PROVIDER_SITE_OTHER): Payer: Medicare HMO | Admitting: Psychology

## 2018-11-29 ENCOUNTER — Ambulatory Visit: Payer: Medicare HMO | Admitting: Psychology

## 2018-11-29 DIAGNOSIS — G20 Parkinson's disease: Secondary | ICD-10-CM | POA: Diagnosis not present

## 2018-11-29 DIAGNOSIS — F0670 Mild neurocognitive disorder due to known physiological condition without behavioral disturbance: Secondary | ICD-10-CM | POA: Insufficient documentation

## 2018-11-29 DIAGNOSIS — G20A1 Parkinson's disease without dyskinesia, without mention of fluctuations: Secondary | ICD-10-CM

## 2018-11-29 HISTORY — DX: Parkinson's disease without dyskinesia, without mention of fluctuations: G20.A1

## 2018-11-29 NOTE — Progress Notes (Signed)
   Neuropsychology Note   [name] completed 100 minutes of neuropsychological testing with technician, Milana Kidney, B.S., under the supervision of Dr. Christia Reading, Ph.D., licensed psychologist. The patient did not appear overtly distressed by the testing session, per behavioral observation or via self-report to the technician. Rest breaks were offered.    In considering the patient's current level of functioning, level of presumed impairment, nature of symptoms, emotional and behavioral responses during the interview, level of literacy, and observed level of motivation/effort, a battery of tests was selected and communicated to the psychometrician.   Communication between the psychologist and technician was ongoing throughout the testing session and changes were made as deemed necessary based on patient performance on testing, technician observations and additional pertinent factors such as those listed above.   Thomas [name] will return within approximately two weeks for an interactive feedback session with Dr. Melvyn Novas at which time his test performances, clinical impressions, and treatment recommendations will be reviewed in detail. The patient understands he can contact our office should he require our assistance before this time.   Full report to follow.  100 minutes were spent face-to-face with patient administering standardized tests and 15 minutes were spent scoring (technician). [CPT [id number], [id number]]

## 2018-11-29 NOTE — Progress Notes (Signed)
NEUROPSYCHOLOGICAL EVALUATION [place] [place] [facility] of Neurology  Reason for Referral:   Thomas [name] is a 68 y.o. Caucasian male referred by Alonza Bogus, D.O., to characterize his current cognitive functioning and assist with diagnostic clarity and treatment planning in the context of a history of Parkinson's disease and subjective cognitive decline.  Assessment and Plan:   Clinical Impression(s): Overall, Thomas [name] pattern of performance is suggestive of mild levels of frontal subcortical dysfunction, as evidenced by variable but generally diminished cognitive abilities across domains of processing speed, attention/concentration, executive functioning, and encoding aspects of new learning and memory. This pattern of performance is quite common in individuals with a history of Parkinson's disease. Cognitive performance was appropriate across domains of basic attention, receptive and expressive language, visuospatial abilities, and retrieval/consolidation aspects of memory. Given evidence for cognitive dysfunction, coupled with report of intact activities of daily living, Thomas [name] meets diagnostic criteria for a Mild Neurocognitive Disorder due to Parkinson's disease at the present time.   Factors which can exacerbate cognitive inefficiencies include ongoing sleep disturbances, various psychosocial stressors, medical ailments, especially those cardiovascular in nature, and the presence of psychiatric distress (e.g., anxiety and depression). It is likely that a combination of these factors influence day-to-day cognitive difficulties which Thomas [name] has been experiencing, with perhaps the primary culprit being the former two. Current levels of psychiatric distress were denied and not suggestive of current mental health concerns.   Recommendations: -Repeat neuropsychological evaluation in 18-24 months (or sooner if functional decline is noted) would be helpful in  assessing the trajectory of Thomas [name] cognitive difficulties, should they change over time.  -He is encouraged to stay mentally, physically, and socially active as these have been shown to be important in maximizing physical and cognitive abilities over time.   -When learning new information, he would benefit from information being broken up into small, manageable pieces. He may also find it helpful to articulate the material in his own words and in a context to promote encoding at the onset of a new task. This material may need to be repeated multiple times to promote encoding.  To address problems with working memory, he may wish to consider:   -Avoiding external distractions (TV, music, background noise/conversations, etc.) when needing to concentrate   -Limiting exposure to fast paced environments with multiple sensory demands   -Writing down complicated information and using checklists, rather than holding everything in mind   -Attempting and completing one task at a time before moving on to the next step/task (i.e., no multi-tasking)   -Verbalizing aloud each step of a task to maintain focus   -Taking frequent breaks during the completion of steps/tasks to avoid fatigue, particularly when focus begins to wane   -Reducing the amount of information considered at one time  Review of Records:   Thomas [name] was seen by [facility] Wells Guiles Tat, D.O.) on 09/08/2018 for follow-up of Parkinson's disease. During that appointment, he noted ongoing left-sided sciatic nerve pain which had impacted his ability to exercise. Medications were adjusted. Sleep attacks, compulsive behaviors, falls, lightheadedness or syncope, and hallucinations were denied. His wife reported some gait difficulties, stating that when Thomas [name] goes to sit, he will shuffle and nearly fall into the chair. She attributed much of this to lack of exercise. Regarding cognition, his wife also reported concerns regarding memory  functioning. As such, Thomas [name] was referred for a comprehensive neuropsychological evaluation to characterize his cognitive abilities and to assist with  diagnostic clarity and future treatment planning.   Brain MRI on 03/16/2015 revealed nonspecific minimal periventricular white matter hyperintensities. No acute or significant abnormality was reported.  Past Medical History:  Diagnosis Date   Allergy    Arthritis    Osteoarthritis   Arthritis of knee    Atrial fibrillation (HCC)    DVT (deep venous thrombosis) (HCC)    L leg   Hyperchloremia    Hypercholesterolemia    Hypertension    Nephrolithiasis    Parkinson's disease ([place])    Plantar wart    Pulmonary embolism (HCC)    Sciatica    Seasonal allergies    TIA (transient ischemic attack)     Past Surgical History:  Procedure Laterality Date   APPENDECTOMY     KNEE ARTHROSCOPY WITH MENISCAL REPAIR Right 2009   KNEE SURGERY Right    T&A     TONSILLECTOMY      Family History  Problem Relation Age of Onset   Parkinson's disease Father    Coronary artery disease Mother    Heart attack Sister    Deep vein thrombosis Sister      Current Outpatient Medications:    ALPRAZolam (XANAX) 0.5 MG tablet, Take 1 tab prior to dermatology appointment., Disp: 5 tablet, Rfl: 0   amantadine (SYMMETREL) 100 MG capsule, TAKE 1 CAPSULE (100 MG TOTAL) BY MOUTH 3 (THREE) TIMES DAILY., Disp: 270 capsule, Rfl: 1   carbidopa-levodopa (SINEMET IR) 25-100 MG tablet, TAKE 2 TABLETS AT 6AM, 10AM, AND 2PM, AND TAKE 1 TABLET AT 6PM AS DIRECTED., Disp: 630 tablet, Rfl: 1   cetirizine (ZYRTEC) 10 MG tablet, Take 10 mg by mouth daily., Disp: , Rfl:    lisinopril (PRINIVIL,ZESTRIL) 30 MG tablet, Take 1 tablet (30 mg total) by mouth daily., Disp: 90 tablet, Rfl: 1   Melatonin 2.5 MG CAPS, Take 1 capsule by mouth at bedtime as needed (for sleep). , Disp: , Rfl:    methocarbamol (ROBAXIN) 500 MG tablet, Take 1 tablet (500 mg  total) by mouth 3 (three) times daily., Disp: 90 tablet, Rfl: 0   metoprolol succinate (TOPROL-XL) 25 MG 24 hr tablet, Take 1 tablet (25 mg total) by mouth daily., Disp: 30 tablet, Rfl: 11   PREVIDENT 5000 BOOSTER PLUS 1.1 % PSTE, See admin instructions., Disp: , Rfl:    rOPINIRole (REQUIP) 2 MG tablet, Take 1 tablet (2 mg total) by mouth 3 (three) times daily., Disp: 270 tablet, Rfl: 1   sildenafil (REVATIO) 20 MG tablet, Take 1 tablet (20 mg total) by mouth 3 (three) times daily as needed., Disp: 90 tablet, Rfl: 6   simvastatin (ZOCOR) 40 MG tablet, Take 1 tablet (40 mg total) by mouth daily., Disp: 90 tablet, Rfl: 1   tamsulosin (FLOMAX) 0.4 MG CAPS capsule, Take 1 capsule (0.4 mg total) by mouth daily., Disp: 90 capsule, Rfl: 1   traZODone (DESYREL) 50 MG tablet, Take 0.5-1 tablets (25-50 mg total) by mouth at bedtime as needed for sleep., Disp: 90 tablet, Rfl: 1   XARELTO 20 MG TABS tablet, TAKE 1 TABLET EVERY DAY, Disp: 90 tablet, Rfl: 0  Clinical Interview:   History of Presenting Concerns: Thomas [name] was diagnosed with idiopathic Parkinson's disease in October 2013. He described a slight tremor in his right (dominant) hand which worsens after continued use. No other balance or coordination difficulties, nor a history of falls was endorsed. He reported that motor symptoms preceded cognitive difficulties by several years and that Parkinson's related medications were  generally effective at treating motor symptoms. He further reported symptoms of hypophonia and micrografia, as well as REM sleep behaviors and a diminished sense of taste and smell. Fluctuations in alertness and the presence of auditory/visual hallucinations were denied. He reported completing speech therapy services, which he described as beneficial, and exercising at [facility] 2-3 times per week.   Cognitive Symptoms: Decreased short-term memory: Endorsed. Provided examples included trouble remember the details of prior  conversations, as well as trouble remembering the names of familiar individuals. These difficulties were said to be present over the past 5-6 years and have exhibited a very gradual decline over that time. Decreased long-term memory: Denied. Decreased attention/concentration: Endorsed. Mr. Baller reported difficulties maintaining focus/concentration, losing his train of thought, and being easily distracted. He also noted needing to re-read passages several times in order to fully comprehend material. While the latter was noted as a longstanding weakness, he did report that this has worsened alongside attentional abilities.  Reduced processing speed: Denied. Difficulties with executive functions: Denied. Difficulties with emotion regulation: Denied. Difficulties with receptive language: Endorsed. However, difficulties were reported only when individuals speaking to Thomas [name] speak too quickly. Outside of these instances, receptive language difficulties were generally denied.  Difficulties with word finding: Endorsed. These were said to worsen when under stress. Decreased visuoperceptual ability: Denied.  Difficulties completing ADLs: Denied. Likewise, Thomas [name] denied current difficulties driving, as well as a recent history of accidents or close calls.   Additional Medical History: History of traumatic brain injury/concussion: Unclear. Thomas [name] reported playing football in high school and had his "bell rung" several times. He was unclear if he ever lost consciousness and did not recall any instances in which he was hospitalized or experienced persisting post-concussion symptoms.  History of stroke: Denied. However, he reported experiencing a transient ischemic attack (TIA) in 2007, during which he experienced a right-sided facial droop and physical weakness, lasting approximately 12-16 hours. He noted returning to his pre-TIA physical baseline after these symptoms subsided.  History of seizure  activity: Denied. History of known exposure to toxins: Unclear. He noted working in a [employer] throughout his life and being around toxic chemicals. However, he did not recall any specific instances of being exposed and developing physical symptoms.  Symptoms of chronic pain: Denied outside of manageable "aches and pains" which were attributed to age. Experience of frequent headaches/migraines: Denied. Frequent instances of dizziness/vertigo: Denied.  Sensory changes: Endorsed. Thomas [name] wears corrective lenses with positive effect. However, he did note that he experiences blurred vision at the end of the day, potentially due to eye strain/fatigue. He also noted a diminished sense of both taste and smell. No hearing related difficulties were reported.  Balance/coordination difficulties: Denied. A history of falls was likewise denied. Other motor difficulties: Endorsed. He reported a slight tremor in his right hand which worsens after extended use.  Sleep History: Estimated hours obtained each night: 6-7 hours. He reported using Trazodone to assist him in falling asleep with positive effect. Difficulties falling asleep: Largely denied since using Trazodone. Difficulties staying asleep: Endorsed. Thomas [name] reported waking up every 1.5-2 hours in order to use the restroom. He generally denied difficulties falling back asleep.  Feels rested and refreshed upon awakening: Endorsed.  History of snoring: Denied. History of waking up gasping for air: Denied. Witnessed breath cessation while asleep: Denied.  History of vivid dreaming: Endorsed. Excessive movement while asleep: Endorsed. Instances of acting out his dreams: Endorsed. Mr. Fariss described several REM sleep  behaviors, including tossing and turning, talking while asleep, and acting out his dreams. Regarding the latter, he reported several times finding himself out of bed, sometimes in a fighting stance (twice in the past 7 years). He also  noted that his wife currently sleeps in her own bed due to being accidentally hit and kicked by Thomas [name] while asleep. These difficulties were said to have worsened lately; he largely attributed this to medication side effects (Sinemet).   Psychiatric/Behavioral Health History: Depression: Denied. Thomas [name] did acknowledge feeling sorry for himself following first being diagnosed with Parkinson's disease. However, he reported joining a support group, which was helpful in improving his spirits. He described his current mood as "good" and denied previous mental health diagnoses. Likewise, he denied any current or remote instances of suicidal ideation, intent, or plan. Anxiety: Denied. Mania: Denied. Trauma History: Denied. Visual/auditory hallucinations: Denied. Delusional thoughts: Denied.  Tobacco: Denied. Alcohol: Thomas [name] denied current alcohol consumption. He likewise denied a history of problematic alcohol use, abuse, or dependence. Recreational drugs: Denied. Caffeine: Endorsed. He reported consuming two cups of coffee each morning.   Academic/Vocational History: Highest level of educational attainment: 14 years. Mr. Gintz graduated from high school and completed two additional years of college. He did not earn an Associate's degree. He described himself as an average (C/C-) student in academic settings.  History of developmental delay: Denied. History of grade repetition: Endorsed. He reported repeating the 3rd grade due to extended absences after being sick.  History of class failures: Denied. Enrollment in special education courses: Denied. However, he did acknowledge having a tutor for several years due to longstanding weaknesses in [place] and grammar-related classes.  History of diagnosed specific learning disability: Denied. History of ADHD: Denied.  Employment: Retired. 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   Evaluation Results:   Behavioral  Observations: Mr. Cindric arrived to his appointment on time and was appropriately dressed and groomed. He exhibited a somewhat slowed gait with a widened stance, but did not exhibit any balance instability. Difficulties getting in and out of his chair were not seen. Gross motor functioning appeared intact upon informal observation. A very slight tremor in his right hand was observed, becoming more noticeable as testing procedures commenced. His affect was generally relaxed and positive, but did range appropriately given the subject being discussed during the clinical interview or the task at hand during testing procedures. Spontaneous speech was fluent and word finding difficulties were not observed during the clinical interview or testing procedures. Sustained attention was appropriate throughout. Thought processes were coherent, organized, and normal in content. Task engagement was adequate and he persisted well when challenged. Overall, Mr. Hetzer was cooperative with the clinical interview and subsequent testing procedures.   Adequacy of Effort: The validity of neuropsychological testing is limited by the extent to which the individual being tested may be assumed to have exerted adequate effort during testing. Mr. Gougeon expressed his intention to perform to the best of his abilities and exhibited adequate task engagement and persistence. Scores across stand-alone and embedded performance validity measures were within expectation. As such, the results of the current evaluation are believed to be a valid representation of Mr. Andersson current cognitive functioning.  Test Results: Mr. Woolum was generally oriented. Points were lost for him not knowing his phone number (0/3; he reported that this is always on speed dial, so he was overall unclear), as well as the current date (0/2).  Intellectual abilities based upon educational  and vocational attainment were estimated to be in the average range. Premorbid  abilities were estimated to be within the below average range based upon a single-word reading test (TOPF).   Processing speed was variable, ranging from the exceptionally low to below average ranges, representing a weakness across the current evaluation. Basic attention was within normal limits. More complex attention (e.g., working memory) was well below average, representing a weakness. Performance across tasks assessing executive functions (e.g., cognitive flexibility, response inhibition, nonverbal abstract reasoning, analytical problem solving) were variable, ranging from the exceptionally low to average ranges, also representing a weakness across the current evaluation.  While not directly assessed, receptive language abilities are believed to be intact as Mr. Hennessee did not exhibit any difficulties comprehending task instructions and answered all questions asked of him appropriately. Assessed expressive language (e.g., verbal fluency and confrontation naming) was within normal limits.     Assessed visuospatial/visuoconstructional abilities were within normal limits. Points were lost across his drawing of a clock due to improper hand placement.    Learning (i.e., encoding) of novel verbal and visual information was generally within appropriate ranges. Spontaneous delayed recall (i.e., retrieval) of previously learned information was somewhat variable, but generally commensurate with performance across initial learning trials. Retention rates were appropriate across memory measures. Performance across recognition tasks was likewise appropriate, suggesting evidence for appropriate information consolidation. A relative weakness across recognition tasks was exhibited across one visual memory task (BVMT-R).    Results of emotional screening instruments suggested that recent symptoms of generalized anxiety were in the minimal range, while symptoms of depression were within normal limits.   Tables of  Scores:   Note: This summary of test scores accompanies the interpretive report and should not be considered in isolation without reference to the appropriate sections in the text. Descriptors are based on appropriate normative data and may be adjusted based on clinical judgment. The terms impaired and within normal limits (WNL) are used when a more specific level of functioning cannot be determined.       Effort Testing:   DESCRIPTOR       [employer] (ACS) Word Choice: --- --- Within Expectation  CVLT-III Brief Form Forced Choice Recognition: --- --- Within Expectation  BVMT-R Retention Percentage: --- --- Within Expectation       Orientation:      Raw Score Percentile   NAB Orientation, Form 1 24/29 <1 Exceptionally Low       Estimated Intellectual Functioning:           Standard Score Percentile   Test of Premorbid Functioning (TOPF): 85 16 Below Average       Memory:          Wechsler Memory Scale (WMS-IV):                       Raw Score (Scaled Score) Percentile     Logical Memory I 21/53 (6) 9 Below Average    Logical Memory II 15/39 (8) 25 Average    Logical Memory Recognition 16/23 10-16 Below Average       [place] Verbal Learning Test (CVLT-III) Brief Form: Raw Score (Scaled/Standard Score) Percentile     Total Trials 1-4 21/36 (81) 10 Below Average    Short-Delay Free Recall 4/9 (3) 3 Exceptionally Low    Long-Delay Free Recall 4/9 (6) 9 Below Average    Long-Delay Cued Recall 4/9 (4) 2 Well Below Average      Recognition Hits 8/9 (  9) 37 Average      False Positive Errors 2 (7) 16 Below Average       Brief Visuospatial Memory Test (BVMT-R), Form 1: Raw Score (T Score) Percentile     Total Trials 1-3 16/36 (41) 18 Below Average    Delayed Recall 5/12 (37) 9 Below Average    Recognition Discrimination Index 3 3-5 Well Below Average      Recognition Hits 3/6 3-5 Well Below Average      False Positive Errors 0 >16 Within Normal Limits        Attention/Executive Function:          Trail Making Test (TMT): Raw Score (T Score) Percentile     Part A 67 secs., 0 errors (29) 2 Exceptionally Low    Part B 193 secs., 3 errors (32) 4 Well Below Average        Scaled Score Percentile   WAIS-IV Coding: 6 9 Below Average       NAB Attention Module, Form 1: T Score Percentile     Digits Forward 56 73 Average    Digits Backwards 36 8 Well Below Average       Delis-Kaplan Executive Function System (D-KEFS)     D-KEFS Color-Word Interference Test: Raw Score (Scaled Score) Percentile     Color Naming 44 secs. (5) 5 Well Below Average    Word Reading 30 secs. (7) 16 Below Average    Inhibition 96 secs. (5) 5 Well Below Average      Total Errors 1 error (11) 63 Average    Inhibition/Switching 132 secs. (2) <1 Exceptionally Low      Total Errors 10 errors (3) 1 Exceptionally Low       D-KEFS 20 Questions Test: Scaled Score Percentile     Total Weighted Achievement Score 6 9 Below Average    Initial Abstraction Score 10 50 Average       Language:          Verbal Fluency Test: Raw Score (T Score) Percentile     Phonemic Fluency (FAS) 28 (39) 14 Below Average    Animal Fluency 16 (44) 27 Average             NAB Language Module, Form 1: T Score Percentile     Naming 56 73 Average       Visuospatial/Visuoconstruction:      Raw Score Percentile   Clock Drawing: 8/10 --- Within Normal Limits       NAB Spatial Module, Form 1: T Score Percentile     Figure Drawing Copy 58 79 Above Average    Figure Drawing Immediate Recall 64 92 Well Above Average        Scaled Score Percentile   WAIS-IV Block Design: 10 50 Average  WAIS-IV Matrix Reasoning: 9 37 Average       Mood and Personality:      Raw Score Percentile   Geriatric Depression Scale: 5 --- Within Normal Limits  Geriatric Anxiety Scale: 0 --- Minimal    Somatic 0 --- Minimal    Cognitive 0 --- Minimal    Affective 0 --- Minimal   Informed Consent and Coding/Compliance:    Mr. Millea was provided with a verbal description of the nature and purpose of the present neuropsychological evaluation. Also reviewed were the foreseeable risks and/or discomforts and benefits of the procedure, limits of confidentiality, and mandatory reporting requirements of this provider. The patient was given the opportunity to ask questions and  receive answers about the evaluation. Oral consent to participate was provided by the patient.   This evaluation was conducted by Christia Reading, Ph.D., licensed clinical neuropsychologist. Mr. Cromie completed a 30-minute clinical interview, billed as one unit [phone], and 115 minutes of cognitive testing, billed as one unit [phone] and three additional units [phone]. Psychometrist Milana Kidney, B.S. assisted Dr. Melvyn Novas with test administration and scoring procedures. As a separate and discrete service, Dr. Melvyn Novas spent a total of 180 minutes in interpretation and report writing, billed as one unit [id number] and two units 96133.

## 2018-12-07 ENCOUNTER — Encounter: Payer: Self-pay | Admitting: Psychology

## 2018-12-07 ENCOUNTER — Telehealth: Payer: Self-pay | Admitting: Family Medicine

## 2018-12-07 ENCOUNTER — Other Ambulatory Visit: Payer: Self-pay

## 2018-12-07 ENCOUNTER — Ambulatory Visit (INDEPENDENT_AMBULATORY_CARE_PROVIDER_SITE_OTHER): Payer: Medicare HMO | Admitting: Psychology

## 2018-12-07 DIAGNOSIS — G20 Parkinson's disease: Secondary | ICD-10-CM | POA: Diagnosis not present

## 2018-12-07 NOTE — Progress Notes (Signed)
   NEUROPSYCHOLOGICAL EVALUATION - Feedback [facility] Department of Neurology  Reason for Referral:   Thomas [name] a 68 y.o. Caucasian male referred by Alonza Bogus, D.O.,to characterize hiscurrent cognitive functioning and assist with diagnostic clarity and treatment planning in the context of a history of Parkinson's disease and subjective cognitive decline.  Feedback:   Thomas [name] completed a comprehensive neuropsychological evaluation on 11/29/2018. Briefly, results suggested mild levels of frontal subcortical dysfunction, as evidenced by variable but generally diminished cognitive abilities across domains of processing speed, attention/concentration, executive functioning, and encoding aspects of new learning and memory. Given evidence for cognitive dysfunction, coupled with report of intact activities of daily living, Thomas [name] meets diagnostic criteria for a Mild Neurocognitive Disorder due to Parkinson's disease at the present time. Recommendations included a repeat neuropsychological evaluation in 18-24 months (or sooner if functional decline is noted) to assess for cognitive decline over time, as well as day-to-day strategies for improving cognitive efficiency.   Thomas [name] was accompanied by his wife. Content of the current session focused on cognitive patterns typical of Parkinson's disease and how cognitive change over time is detected should it occur. Thomas [name] and his wife were given the opportunity to ask questions and all their questions were answered. He was also encouraged to reach out should additional questions arise. A copy of his report was provided.      30 minutes were spent with Thomas [name] and his wife during the current feedback session.

## 2018-12-07 NOTE — Chronic Care Management (AMB) (Signed)
°  Chronic Care Management   Outreach Note  12/07/2018 Name: Thomas [name] MRN: [id number] DOB: 1950/07/25  Referred by: [name], DO Reason for referral : Chronic Care Management (Initial CCM outreach was unsuccessful.)   An unsuccessful telephone outreach was attempted today. The patient was referred to the case management team by for assistance with chronic care management and care coordination.   Follow Up Plan: A HIPPA compliant phone message was left for the patient providing contact information and requesting a return call.  The care management team will reach out to the patient again over the next 7 days.  If patient returns call to provider office, please advise to call [place] at [place]  [email or web address]   ??[phone]

## 2018-12-09 NOTE — Chronic Care Management (AMB) (Signed)
°  Chronic Care Management   Outreach Note  12/09/2018 Name: Thomas [name] MRN: [id number] DOB: Mar 15, 1951  Referred by: [name], DO Reason for referral : Chronic Care Management (Initial CCM outreach was unsuccessful.) and Chronic Care Management (Second CCM outreach was unsuccessful)   A second unsuccessful telephone outreach was attempted today. The patient was referred to the case management team for assistance with chronic care management and care coordination.   Follow Up Plan: The care management team will reach out to the patient again over the next 7 days.   [place]  [email or web address]   ??[phone]

## 2018-12-13 NOTE — Chronic Care Management (AMB) (Signed)
Chronic Care Management   Note  12/13/2018 Name: Thomas [name] MRN: [id number] DOB: [birth year]  Thomas [name] is a 68 y.o. year old male who is a primary care patient of [name], DO. I reached out to [name] by phone today in response to a referral sent by Mr. Belenda Cruise Balbi's health plan.    Mr. Blyden was given information about Chronic Care Management services today including:  1. CCM service includes personalized support from designated clinical staff supervised by his physician, including individualized plan of care and coordination with other care providers 2. 24/7 contact phone numbers for assistance for urgent and routine care needs. 3. Service will only be billed when office clinical staff spend 20 minutes or more in a month to coordinate care. 4. Only one practitioner may furnish and bill the service in a calendar month. 5. The patient may stop CCM services at any time (effective at the end of the month) by phone call to the office staff. 6. The patient will be responsible for cost sharing (co-pay) of up to 20% of the service fee (after annual deductible is met).  Patient agreed to services and verbal consent obtained.   Follow up plan: Telephone appointment with CCM team member scheduled for: 01/07/2019  [place]  [email or web address]   ??[phone]

## 2018-12-21 ENCOUNTER — Ambulatory Visit (INDEPENDENT_AMBULATORY_CARE_PROVIDER_SITE_OTHER): Payer: Medicare HMO | Admitting: Family Medicine

## 2018-12-21 ENCOUNTER — Other Ambulatory Visit: Payer: Self-pay

## 2018-12-21 ENCOUNTER — Encounter: Payer: Self-pay | Admitting: Family Medicine

## 2018-12-21 DIAGNOSIS — E7800 Pure hypercholesterolemia, unspecified: Secondary | ICD-10-CM | POA: Diagnosis not present

## 2018-12-21 DIAGNOSIS — R350 Frequency of micturition: Secondary | ICD-10-CM | POA: Diagnosis not present

## 2018-12-21 DIAGNOSIS — N401 Enlarged prostate with lower urinary tract symptoms: Secondary | ICD-10-CM

## 2018-12-21 DIAGNOSIS — I10 Essential (primary) hypertension: Secondary | ICD-10-CM | POA: Diagnosis not present

## 2018-12-21 MED ORDER — LISINOPRIL 10 MG PO TABS: 10.0000 mg | ORAL_TABLET | Freq: Every day | ORAL | 0 refills | Status: DC

## 2018-12-21 MED ORDER — RIVAROXABAN 20 MG PO TABS: 20.0000 mg | ORAL_TABLET | Freq: Every day | ORAL | 3 refills | Status: DC

## 2018-12-21 NOTE — Progress Notes (Signed)
BP (!) 151/83   Pulse (!) 56   Wt 242 lb (109.8 kg)   SpO2 99%   BMI 34.72 kg/m    Subjective:    Patient ID: Thomas [name], male    DOB: [birth year], 68 y.o.   MRN: [id number]  HPI: Thomas [name] is a 68 y.o. male  Chief Complaint  Patient presents with  . Hypertension  . Hyperlipidemia  . Benign Prostatic Hypertrophy   HYPERTENSION / [place] Satisfied with current treatment? yes Duration of hypertension: chronic BP monitoring frequency: a few times a week BP medication side effects: no Past BP meds:  Duration of hyperlipidemia: chronic Cholesterol medication side effects: no Cholesterol supplements: none Past cholesterol medications: simvastatin Medication compliance: excellent compliance Aspirin: no Recent stressors: yes Recurrent headaches: no Visual changes: no Palpitations: no Dyspnea: no Chest pain: no Lower extremity edema: no Dizzy/lightheaded: no  BPH BPH status: controlled Satisfied with current treatment?: yes Medication side effects: no Medication compliance: excellent compliance Duration: chronic Nocturia: 2-3x per night Urinary frequency:no Incomplete voiding: no Urgency: no Weak urinary stream: no Straining to start stream: no Dysuria: no Onset: gradual Severity: mild   Relevant past medical, surgical, family and social history reviewed and updated as indicated. Interim medical history since our last visit reviewed. Allergies and medications reviewed and updated.  Review of Systems  Constitutional: Negative.   Respiratory: Negative.   Cardiovascular: Negative.   Musculoskeletal: Negative.   Neurological: Positive for tremors and weakness. Negative for dizziness, seizures, syncope, facial asymmetry, speech difficulty, light-headedness, numbness and headaches.  Psychiatric/Behavioral: Negative.     Per HPI unless specifically indicated above     Objective:    BP (!) 151/83   Pulse (!) 56   Wt 242 lb (109.8 kg)   SpO2  99%   BMI 34.72 kg/m   Wt Readings from Last 3 Encounters:  12/21/18 242 lb (109.8 kg)  11/01/18 245 lb (111.1 kg)  09/30/18 248 lb (112.5 kg)    Physical Exam Constitutional:      General: He is not in acute distress.    Appearance: Normal appearance. He is well-developed. He is obese. He is not ill-appearing, toxic-appearing or diaphoretic.  HENT:     Head: Normocephalic and atraumatic.     Right Ear: Hearing and external ear normal.     Left Ear: Hearing and external ear normal.     Nose: Nose normal.  Eyes:     General: Lids are normal. No scleral icterus.       Right eye: No discharge.        Left eye: No discharge.     Extraocular Movements: Extraocular movements intact.     Conjunctiva/sclera: Conjunctivae normal.     Pupils: Pupils are equal, round, and reactive to light.  Neck:     Musculoskeletal: Normal range of motion.  Pulmonary:     Effort: Pulmonary effort is normal. No respiratory distress.  Musculoskeletal: Normal range of motion.  Skin:    Coloration: Skin is not jaundiced or pale.     Findings: No bruising, erythema, lesion or rash.  Neurological:     General: No focal deficit present.     Mental Status: He is alert and oriented to person, place, and time.  Psychiatric:        Mood and Affect: Mood normal.        Speech: Speech normal.        Behavior: Behavior normal.        Thought Content:  Thought content normal.        Judgment: Judgment normal.     Results for orders placed or performed during the hospital encounter of 08/28/18  CBC with Differential  Result Value Ref Range   WBC 6.2 4.0 - 10.5 K/uL   RBC 4.78 4.22 - 5.81 MIL/uL   Hemoglobin 14.5 13.0 - 17.0 g/dL   HCT 43.5 39.0 - 52.0 %   MCV 91.0 80.0 - 100.0 fL   MCH 30.3 26.0 - 34.0 pg   MCHC 33.3 30.0 - 36.0 g/dL   RDW 11.9 11.5 - 15.5 %   Platelets 154 150 - 400 K/uL   nRBC 0.0 0.0 - 0.2 %   Neutrophils Relative % 63 %   Neutro Abs 3.9 1.7 - 7.7 K/uL   Lymphocytes Relative 24 %    Lymphs Abs 1.5 0.7 - 4.0 K/uL   Monocytes Relative 8 %   Monocytes Absolute 0.5 0.1 - 1.0 K/uL   Eosinophils Relative 4 %   Eosinophils Absolute 0.2 0.0 - 0.5 K/uL   Basophils Relative 1 %   Basophils Absolute 0.1 0.0 - 0.1 K/uL   Immature Granulocytes 0 %   Abs Immature Granulocytes 0.01 0.00 - 0.07 K/uL  Comprehensive metabolic panel  Result Value Ref Range   Sodium 141 135 - 145 mmol/L   Potassium 4.2 3.5 - 5.1 mmol/L   Chloride 110 98 - 111 mmol/L   CO2 25 22 - 32 mmol/L   Glucose, Bld 125 (H) 70 - 99 mg/dL   BUN 21 8 - 23 mg/dL   Creatinine, Ser 0.91 0.61 - 1.24 mg/dL   Calcium 8.9 8.9 - 10.3 mg/dL   Total Protein 6.8 6.5 - 8.1 g/dL   Albumin 4.0 3.5 - 5.0 g/dL   AST 11 (L) 15 - 41 U/L   ALT <5 0 - 44 U/L   Alkaline Phosphatase 71 38 - 126 U/L   Total Bilirubin 0.5 0.3 - 1.2 mg/dL   GFR calc non Af Amer >60 >60 mL/min   GFR calc Af Amer >60 >60 mL/min   Anion gap 6 5 - 15      Assessment & Plan:   Problem List Items Addressed This Visit      Cardiovascular and Mediastinum   Hypertension    Not under great control. Will increase lisinopril to 40mg  and recheck 4-6 weeks with labs. Call with any concerns.       Relevant Medications   lisinopril (ZESTRIL) 10 MG tablet   rivaroxaban (XARELTO) 20 MG TABS tablet     Genitourinary   BPH (benign prostatic hyperplasia)    Under good control on current regimen. Continue current regimen. Continue to monitor. Call with any concerns. Refills given.          Other   Hypercholesterolemia    Under good control on current regimen. Continue current regimen. Continue to monitor. Call with any concerns. Refills given. Will get labs next visit.        Relevant Medications   lisinopril (ZESTRIL) 10 MG tablet   rivaroxaban (XARELTO) 20 MG TABS tablet       Follow up plan: Return in about 4 weeks (around 01/18/2019).    . This visit was completed via FaceTime due to the restrictions of the COVID-19 pandemic. All issues  as above were discussed and addressed. Physical exam was done as above through visual confirmation on FaceTime. If it was felt that the patient should be evaluated in the office,  they were directed there. The patient verbally consented to this visit. . Location of the patient: home . Location of the provider: work . Those involved with this call:  . Provider: Park Liter, DO . CMA: Tiffany Reel, CMA . Front Desk/Registration: Don Perking  . Time spent on call: 23 minutes with patient face to face via video conference. More than 50% of this time was spent in counseling and coordination of care. 40 minutes total spent in review of patient's record and preparation of their chart.

## 2018-12-21 NOTE — Assessment & Plan Note (Signed)
Not under great control. Will increase lisinopril to 40mg  and recheck 4-6 weeks with labs. Call with any concerns.

## 2018-12-21 NOTE — Assessment & Plan Note (Signed)
Under good control on current regimen. Continue current regimen. Continue to monitor. Call with any concerns. Refills given. Will get labs next visit.

## 2018-12-21 NOTE — Assessment & Plan Note (Signed)
Under good control on current regimen. Continue current regimen. Continue to monitor. Call with any concerns. Refills given.   

## 2019-01-07 ENCOUNTER — Telehealth: Payer: Medicare HMO

## 2019-01-10 ENCOUNTER — Other Ambulatory Visit: Payer: Self-pay

## 2019-01-10 ENCOUNTER — Ambulatory Visit (INDEPENDENT_AMBULATORY_CARE_PROVIDER_SITE_OTHER): Payer: Medicare HMO

## 2019-01-10 ENCOUNTER — Ambulatory Visit: Payer: Self-pay | Admitting: *Deleted

## 2019-01-10 DIAGNOSIS — Z23 Encounter for immunization: Secondary | ICD-10-CM | POA: Diagnosis not present

## 2019-01-10 NOTE — Progress Notes (Signed)
Flu vaccine

## 2019-01-10 NOTE — Telephone Encounter (Signed)
See B/P readings Reason for Disposition . [1] Caller requesting NON-URGENT health information AND [2] PCP's office is the best resource  Answer Assessment - Initial Assessment Questions 1. REASON FOR CALL or QUESTION: "What is your reason for calling today?" or "How can I best help you?" or "What question do you have that I can help answer?"     B/P numbers.  Protocols used: INFORMATION ONLY CALL-A-AH

## 2019-01-10 NOTE — Telephone Encounter (Signed)
Patient had virtual visit 9/8. Lisinopril increased from 30 to 40 mg daily. Calls in with b/p steadily increasing over the last week. B/P as follows: 15th 127/85 16th 137/85 17th 111/75 18th 159/80 19th 126/70 20th 122/71 21st 136/80 22nd 159/88 23rd 142/77 24th-27th  140's/80's  Today 142/85. Denies Headaches/CP/SOB/dizziness/weakness/ vison changes. Concerned dosage change is not effective and would like physician to review numbers and advise.  Pharmacy on file.

## 2019-01-10 NOTE — Telephone Encounter (Signed)
Most of his numbers look fine. I'd have him watch his diet- avoiding salt and make sure he's checking his BP after he has been sitting for about 10 minutes. Thanks!

## 2019-01-25 NOTE — Progress Notes (Signed)
Thomas [name] was seen today in follow up for Parkinsons disease.  Patient's wife on the phone and supplements the history.   Pt denies falls but has had near falls but he has a walker for the AM.  He has "choppy steps" in the AM for about 30 min.  Noting some leaning to the right.  Pt denies lightheadedness, near syncope.  No hallucinations.  Mood has been good. BP has been elevated - has an appointment with PCP to discuss.  Dyskinesia getting worse.    Patient did have physical therapy done since our last visit.  I have reviewed those notes.  Patient underwent neurocognitive testing with Dr. Melvyn Novas on November 29, 2018.  I reviewed those records.  Mild neurocognitive impairment was noted.  Wife thinks cognition is declining.  Current movement disorder medications: Ropinirole, 2 mg 3 times per day Carbidopa/levodopa 25/100, 2 tablets at 6 AM/2 tablets at 10 AM/2 tablets at 2 PM/2 tablets at 6 PM (increased last visit) Amantadine, 100 mg 3 times per day   PREVIOUS MEDICATIONS: Levodopa; amantadine; requip; azilect  ALLERGIES:   Allergies  Allergen Reactions  . Codeine Other (See Comments)    Reaction: Hallucinations    CURRENT MEDICATIONS:  Outpatient Encounter Medications as of 01/27/2019  Medication Sig  . ALPRAZolam (XANAX) 0.5 MG tablet Take 1 tab prior to dermatology appointment.  Marland Kitchen amantadine (SYMMETREL) 100 MG capsule TAKE 1 CAPSULE (100 MG TOTAL) BY MOUTH 3 (THREE) TIMES DAILY.  . carbidopa-levodopa (SINEMET IR) 25-100 MG tablet TAKE 2 TABLETS AT 6AM, 10AM, AND 2PM, AND TAKE 1 TABLET AT 6PM AS DIRECTED. (Patient taking differently: 2 tablets 4 (four) times daily. TAKE 2 TABLETS AT 6AM, 10AM, AND 2PM, AND TAKE 2 TABLET AT 6PM AS DIRECTED.)  . cetirizine (ZYRTEC) 10 MG tablet Take 10 mg by mouth daily.  Marland Kitchen lisinopril (PRINIVIL,ZESTRIL) 30 MG tablet Take 1 tablet (30 mg total) by mouth daily.  Marland Kitchen lisinopril (ZESTRIL) 10 MG tablet Take 1 tablet (10 mg total) by mouth daily.  . Melatonin  2.5 MG CAPS Take 1 capsule by mouth at bedtime as needed (for sleep).   . methocarbamol (ROBAXIN) 500 MG tablet Take 1 tablet (500 mg total) by mouth 3 (three) times daily.  . metoprolol succinate (TOPROL-XL) 25 MG 24 hr tablet Take 1 tablet (25 mg total) by mouth daily.  Marland Kitchen PREVIDENT 5000 BOOSTER PLUS 1.1 % PSTE See admin instructions.  . rivaroxaban (XARELTO) 20 MG TABS tablet Take 1 tablet (20 mg total) by mouth daily.  Marland Kitchen rOPINIRole (REQUIP) 2 MG tablet Take 1 tablet (2 mg total) by mouth 3 (three) times daily.  . sildenafil (REVATIO) 20 MG tablet Take 1 tablet (20 mg total) by mouth 3 (three) times daily as needed.  . simvastatin (ZOCOR) 40 MG tablet Take 1 tablet (40 mg total) by mouth daily.  . tamsulosin (FLOMAX) 0.4 MG CAPS capsule Take 1 capsule (0.4 mg total) by mouth daily.  . traZODone (DESYREL) 50 MG tablet Take 0.5-1 tablets (25-50 mg total) by mouth at bedtime as needed for sleep.   No facility-administered encounter medications on file as of 01/27/2019.     PAST MEDICAL HISTORY:   Past Medical History:  Diagnosis Date  . Allergy   . Arthritis    Osteoarthritis  . Arthritis of knee   . Atrial fibrillation ([place])   . DVT (deep venous thrombosis) ([facility])    L leg  . Hyperchloremia   . Hypercholesterolemia   . Hypertension   .  Nephrolithiasis   . Parkinson's disease ([place])   . Plantar wart   . Pulmonary embolism ([place])   . Sciatica   . Seasonal allergies   . TIA (transient ischemic attack)     PAST SURGICAL HISTORY:   Past Surgical History:  Procedure Laterality Date  . APPENDECTOMY    . KNEE ARTHROSCOPY WITH MENISCAL REPAIR Right 2009  . KNEE SURGERY Right   . T&A    . TONSILLECTOMY      SOCIAL HISTORY:   Social History   Socioeconomic History  . Marital status: Married    Spouse name: Not on file  . Number of children: 0  . Years of education: Not on file  . Highest education level: Some college, no degree  Occupational History  . Occupation: retired     Comment: Engineer, maintenance  . Financial resource strain: Not hard at all  . Food insecurity    Worry: Never true    Inability: Never true  . Transportation needs    Medical: No    Non-medical: No  Tobacco Use  . Smoking status: Never Smoker  . Smokeless tobacco: Never Used  Substance and Sexual Activity  . Alcohol use: No    Alcohol/week: 0.0 standard drinks  . Drug use: No  . Sexual activity: Not on file  Lifestyle  . Physical activity    Days per week: 3 days    Minutes per session: 80 min  . Stress: Not at all  Relationships  . Social Herbalist on phone: Once a week    Gets together: More than three times a week    Attends religious service: More than 4 times per year    Active member of club or organization: Yes    Attends meetings of clubs or organizations: More than 4 times per year    Relationship status: Married  . Intimate partner violence    Fear of current or ex partner: No    Emotionally abused: No    Physically abused: No    Forced sexual activity: No  Other Topics Concern  . Not on file  Social History Narrative   ** Merged History Encounter **        FAMILY HISTORY:   Family Status  Relation Name Status  . Father  Deceased       PD  . Mother  Deceased       CHF  . Sister  Deceased       CHF  . Sister  Deceased       breast cancer  . Sister  Alive       heart disease  . Sister  (Not Specified)  . Sister  (Not Specified)    ROS:  Review of Systems  Constitutional: Negative.   HENT: Negative.   Eyes: Negative.   Respiratory: Negative.   Cardiovascular: Negative.   Gastrointestinal: Negative.   Genitourinary: Negative.   Skin: Negative.     PHYSICAL EXAMINATION:    VITALS:   Vitals:   01/27/19 1109  BP: (!) 144/96  Pulse: 60  SpO2: 97%  Weight: 247 lb (112 kg)  Height: 5\' 10"  (1.778 m)    GEN:  The patient appears stated age and is in NAD. HEENT:  Normocephalic, atraumatic.  The mucous membranes are moist.  The superficial temporal arteries are without ropiness or tenderness. CV:  RRR Lungs:  CTAB Neck/HEME:  There are no carotid bruits bilaterally.  Neurological examination:  Orientation: The patient is alert and oriented x3. Cranial nerves: There is good facial symmetry with facial hypomimia. The speech is fluent and clear. Soft palate rises symmetrically and there is no tongue deviation. Hearing is intact to conversational tone. Sensation: Sensation is intact to light touch throughout Motor: Strength is at least antigravity x4.  Movement examination: Tone: There is normal tone in the upper and lower extremities. Abnormal movements: Mild dyskinesias noted. Coordination:  There is no decremation with RAM's,  Gait and Station: The patient has no difficulty arising out of a deep-seated chair without the use of the hands. The patient's stride length is good with decreased arm swing on the left.      Chemistry      Component Value Date/Time   NA 141 08/28/2018 2241   NA 139 06/17/2018 1332   K 4.2 08/28/2018 2241   CL 110 08/28/2018 2241   CO2 25 08/28/2018 2241   BUN 21 08/28/2018 2241   BUN 14 06/17/2018 1332   CREATININE 0.91 08/28/2018 2241      Component Value Date/Time   CALCIUM 8.9 08/28/2018 2241   ALKPHOS 71 08/28/2018 2241   AST 11 (L) 08/28/2018 2241   ALT <5 08/28/2018 2241   BILITOT 0.5 08/28/2018 2241   BILITOT 0.7 06/17/2018 1332     Lab Results  Component Value Date   TSH 1.410 06/17/2018      ASSESSMENT/PLAN:  1. Idiopathic Parkinson's disease, diagnosed in October 2013 in [place] with nonmotor symptoms that preceded this for several years. Father had PD as well.  -continue ropinirole 2 mg 3 times.  -continue carbidopa/levodopa 25/100 2 tablets at 6am/ 2 tablets at 10am/ 2 tablets at 2pm/2 at 6pm  -Somewhat worsening dyskinesia.  Discussed Gocovri, but ultimately decided to continue amantadine, 100 mg 3 times  per day because of cost of good coverage, but also changing other things today.   -Asked about upcoming research.  Gave them information to clinical trials.gov  -Discussed that he is within the window for DBS.  I talked to the patient about the logistics associated with DBS therapy.  I talked to the patient about risks/benefits/side effects of DBS therapy.  We talked about risks which included but were not limited to infection, paralysis, intraoperative seizure, death, stroke, bleeding around the electrode.   I talked to patient about fiducial placement 1 week prior to DBS therapy.  I talked to the patient about what to expect in the operating room, including the fact that this is an awake surgery.  We talked about battery placement as well as which is done under general anesthesia, generally approximately one week following the initial surgery.  We also talked about the fact that the patient will need to be off of medications for surgery.  The patient and family were given the opportunity to ask questions, which they did, and I answered them to the best of my ability today.  He is not interested.  He will let me know if he changes his mind.  -discussed kynmobi for first morning on.  I think this could be of value for him, given that he is having trouble for the first 20 minutes to 1 hour.  We will do a benefits investigation.  I am not sure if this will be cost effective for him.  -Start carbidopa/levodopa 50/200 CR for cramping and first morning on 2. Constipation, associated with Parkinson's disease. -Doing well with MiraLAX 1 time every 3 weeks. 3. Hyperreflexia but neck pain  better per patient -very hyperreflexic but doesn't wish to pursue MRI cervical spine 4. REM behavior disorder -This is commonly associated with PD and the patient is experiencing this. We discussed that this can be very serious and even harmful. We talked about medications as  well as physical barriers to put in the bed (particularly soft bed rails, pillow barriers). We talked about moving the night stand so that it is not so close to the side of the bed. He doesn't want medication right now so encouraged him to use the bedrails.   He admits that he really does not want to use bed rails because of urinary issues. 5. A-fib -worse when on amiodarone but been in sinus for a long time. -He tried to decrease metoprolol, but had more palpitations and went back up on it.  6. Memory change -Patient had neurocognitive testing in August, 2020, only noting mild neurocognitive impairment. 7.   Urinary frequency             -Discussed referral to [facility] urology.  They declined.  Stated that they were going to talk to their primary care about this instead.  Will let me know if they change their mind.  8.  HTN  -has f/u with PCP.  This is unrelated to Parkinson's disease. 9.  Follow up is anticipated in the next 4-6 months, sooner should new neurologic issues arise.  Much greater than 50% of this visit was spent in counseling and coordinating care.  Total face to face time:  30 min  Cc:  [name], DO

## 2019-01-27 ENCOUNTER — Ambulatory Visit: Payer: Medicare HMO | Admitting: Neurology

## 2019-01-27 ENCOUNTER — Other Ambulatory Visit: Payer: Self-pay

## 2019-01-27 ENCOUNTER — Encounter: Payer: Self-pay | Admitting: Neurology

## 2019-01-27 VITALS — BP 144/96 | HR 60 | Ht 70.0 in | Wt 247.0 lb

## 2019-01-27 DIAGNOSIS — G249 Dystonia, unspecified: Secondary | ICD-10-CM | POA: Diagnosis not present

## 2019-01-27 DIAGNOSIS — I10 Essential (primary) hypertension: Secondary | ICD-10-CM | POA: Diagnosis not present

## 2019-01-27 DIAGNOSIS — G20 Parkinson's disease: Secondary | ICD-10-CM

## 2019-01-27 MED ORDER — CARBIDOPA-LEVODOPA ER 50-200 MG PO TBCR: 1.0000 | EXTENDED_RELEASE_TABLET | Freq: Every day | ORAL | 1 refills | Status: DC

## 2019-01-27 MED ORDER — CARBIDOPA-LEVODOPA ER 50-200 MG PO TBCR: 1.0000 | EXTENDED_RELEASE_TABLET | Freq: Every day | ORAL | 0 refills | Status: DC

## 2019-01-27 NOTE — Patient Instructions (Signed)
1.  We will schedule you for Kynmobi.  They will do a benefit investigation with your insurance and call you 2.  Start carbidopa/levodopa 50/200 at bedtime for cramping and first morning on  The physicians and staff at [facility] are committed to providing excellent care. You may receive a survey requesting feedback about your experience at our office. We strive to receive "very good" responses to the survey questions. If you feel that your experience would prevent you from giving the office a "very good " response, please contact our office to try to remedy the situation. We may be reached at [phone]. Thank you for taking the time out of your busy day to complete the survey.

## 2019-01-30 NOTE — Progress Notes (Signed)
Patient ID: Thomas [name], male   DOB: Dec 19, 1950, 68 y.o.   MRN: [id number] Cardiology Office Note  Date:  02/01/2019   ID:  Thomas [name], DOB 04-02-1951, MRN [id number]  PCP:  [name], DO   Chief Complaint  Patient presents with  . other    12 month follow up. Patient denies chest pain and SOB at this time. Meds reviewed verbally with patient.     HPI:  68 yo male with a history of  DVT, PE on chronic anticoagulation Parkinson's,  paroxysmal atrial fibrillation,  on xarelto,  07/2015 DJD on CT scan thoracic spine No prior smoking history, no diabetes, cholesterol well controlled on a statin who presents for follow-up of his atrial fibrillation   In follow-up today reports he feels well He has had 10 pound weight gain over the past year from inactivity, poor diet  Blood pressure running high, numbers discussed with patient and wife in detail Lisinopril was recently increased up to 40 mg daily Minimal lower extremity edema Has restarted his walking program, starting to go back to [employer]  Previously was participating in rock steady boxing Denies having shortness of breath Tolerating blood thinners  Prior EKG with PVCs, he denies any palpitations  Lab work reviewed with him Total cholesterol 126 LDL 67 Creatinine 1.14  EKG personally reviewed by myself on todays visit Shows normal sinus rhythm with rate 58 bpm   Other past medical history reviewed 06/23/2014 Lower extremity ultrasound revealed persistent partially occlusive thrombus in the left posterior tibial, popliteal, and mid femoral veins.     01/02/2015 Left lower extremity duplex  revealed persistent but improving nonocclusive partial thrombus in the femoral and popliteal veins.  Calf veins were patient.    Chest CT on 06/23/2014 revealed no pulmonary nodules or adenopathy.    He did not do well on amiodarone, felt this made his tremor worse He is followed by neurology in [place], Dr.  Carles Collet  Previously seen in the emergency room in the setting of upper respiratory infection, found to be in atrial fibrillation, converted in the emergency room after he was given amiodarone   PMH:   has a past medical history of Allergy, Arthritis, Arthritis of knee, Atrial fibrillation ([place]), DVT (deep venous thrombosis) ([place]), Hyperchloremia, Hypercholesterolemia, Hypertension, Nephrolithiasis, Parkinson's disease ([place]), Plantar wart, Pulmonary embolism ([place]), Sciatica, Seasonal allergies, and TIA (transient ischemic attack).  PSH:    Past Surgical History:  Procedure Laterality Date  . APPENDECTOMY    . KNEE ARTHROSCOPY WITH MENISCAL REPAIR Right 2009  . KNEE SURGERY Right   . T&A    . TONSILLECTOMY      Current Outpatient Medications  Medication Sig Dispense Refill  . ALPRAZolam (XANAX) 0.5 MG tablet Take 1 tab prior to dermatology appointment. 5 tablet 0  . amantadine (SYMMETREL) 100 MG capsule TAKE 1 CAPSULE (100 MG TOTAL) BY MOUTH 3 (THREE) TIMES DAILY. 270 capsule 1  . amLODipine (NORVASC) 2.5 MG tablet Take 1 tablet (2.5 mg total) by mouth daily as needed. Take if BP >160/90 as needes 30 tablet 3  . carbidopa-levodopa (SINEMET CR) 50-200 MG tablet Take 1 tablet by mouth at bedtime. 90 tablet 1  . carbidopa-levodopa (SINEMET IR) 25-100 MG tablet TAKE 2 TABLETS AT 6AM, 10AM, AND 2PM, AND TAKE 1 TABLET AT 6PM AS DIRECTED. (Patient taking differently: 2 tablets 4 (four) times daily. TAKE 2 TABLETS AT 6AM, 10AM, AND 2PM, AND TAKE 2 TABLET AT 6PM AS DIRECTED.) 630 tablet 1  .  cetirizine (ZYRTEC) 10 MG tablet Take 10 mg by mouth daily.    Marland Kitchen lisinopril (PRINIVIL,ZESTRIL) 30 MG tablet Take 1 tablet (30 mg total) by mouth daily. 90 tablet 1  . lisinopril (ZESTRIL) 10 MG tablet Take 1 tablet (10 mg total) by mouth daily. 90 tablet 0  . Melatonin 2.5 MG CAPS Take 1 capsule by mouth at bedtime as needed (for sleep).     . metoprolol succinate (TOPROL-XL) 25 MG 24 hr tablet Take 1 tablet (25 mg  total) by mouth daily. 30 tablet 11  . PREVIDENT 5000 BOOSTER PLUS 1.1 % PSTE See admin instructions.    . rivaroxaban (XARELTO) 20 MG TABS tablet Take 1 tablet (20 mg total) by mouth daily. 90 tablet 3  . rOPINIRole (REQUIP) 2 MG tablet Take 1 tablet (2 mg total) by mouth 3 (three) times daily. 270 tablet 1  . sildenafil (REVATIO) 20 MG tablet Take 1 tablet (20 mg total) by mouth 3 (three) times daily as needed. 90 tablet 6  . simvastatin (ZOCOR) 40 MG tablet Take 1 tablet (40 mg total) by mouth daily. 90 tablet 1  . tamsulosin (FLOMAX) 0.4 MG CAPS capsule Take 1 capsule (0.4 mg total) by mouth daily. 90 capsule 1  . traZODone (DESYREL) 50 MG tablet Take 0.5-1 tablets (25-50 mg total) by mouth at bedtime as needed for sleep. 90 tablet 1   No current facility-administered medications for this visit.      Allergies:   Codeine   Social History:  The patient  reports that he has never smoked. He has never used smokeless tobacco. He reports that he does not drink alcohol or use drugs.   Family History:   family history includes Coronary artery disease in his mother; Deep vein thrombosis in his sister; Heart attack in his sister; Parkinson's disease in his father.    Review of Systems: Review of Systems  Constitutional:       Weight gain  Respiratory: Negative.   Cardiovascular: Negative.   Gastrointestinal: Negative.   Musculoskeletal: Negative.   Neurological: Negative.   Psychiatric/Behavioral: Negative.   All other systems reviewed and are negative.    PHYSICAL EXAM: VS:  BP 116/74 (BP Location: Left Arm, Patient Position: Sitting, Cuff Size: Normal)   Pulse (!) 58  , BMI There is no height or weight on file to calculate BMI. Constitutional:  oriented to person, place, and time. No distress.  Obese HENT:  Head: Grossly normal Eyes:  no discharge. No scleral icterus.  Neck: No JVD, no carotid bruits  Cardiovascular: Regular rate and rhythm, no murmurs  appreciated Pulmonary/Chest: Clear to auscultation bilaterally, no wheezes or rails Abdominal: Soft.  no distension.  no tenderness.  Musculoskeletal: Normal range of motion Neurological:  normal muscle tone. Coordination normal. No atrophy Skin: Skin warm and dry Psychiatric: normal affect, pleasant   Recent Labs: 06/17/2018: TSH 1.410 08/28/2018: ALT <5; Hemoglobin 14.5; Platelets 154 01/31/2019: BUN 18; Creatinine, Ser 1.14; Potassium 4.5; Sodium 141    Lipid Panel Lab Results  Component Value Date   CHOL 126 06/17/2018   HDL 48 06/17/2018   LDLCALC 67 06/17/2018   TRIG 53 06/17/2018      Wt Readings from Last 3 Encounters:  01/31/19 246 lb (111.6 kg)  01/27/19 247 lb (112 kg)  12/21/18 242 lb (109.8 kg)     ASSESSMENT AND PLAN:  Paroxysmal atrial fibrillation (HCC) - Plan: EKG 12-Lead Normal sinus rhythm, denies any tachycardia or palpitations concerning for atrial fibrillation  No changes to his medications, or any anticoagulation  PVCs Asymptomatic,  No further work-up needed  Hypercholesterolemia Cholesterol is at goal on the current lipid regimen. No changes to the medications were made.  Essential hypertension Long discussion concerning blood pressure Appears somewhat labile Warned against hypotension Suggested he check blood pressure when standing at home Stay hydrated No further medication changes made  Deep vein thrombosis (DVT) of left lower extremity, unspecified chronicity, unspecified vein (HCC)  nonocclusive clot left popliteal vein  on chronic anticoagulation   Other chronic pulmonary embolism (HCC) On chronic anticoagulation   Parkinson's disease ([place]) Has been less active over the past year, wife has noticed tremendous decline in his fitness Recommend he restart his exercise program He is working with PT and OT   Total encounter time more than 25 minutes  Greater than 50% was spent in counseling and coordination of care with the  patient  Disposition:   F/U  12 months   Orders Placed This Encounter  Procedures  . EKG 12-Lead    Signed, Esmond Plants, M.D., Ph.D. 02/01/2019  [place], [place]

## 2019-01-31 ENCOUNTER — Other Ambulatory Visit: Payer: Self-pay

## 2019-01-31 ENCOUNTER — Ambulatory Visit (INDEPENDENT_AMBULATORY_CARE_PROVIDER_SITE_OTHER): Payer: Medicare HMO | Admitting: Family Medicine

## 2019-01-31 ENCOUNTER — Encounter: Payer: Self-pay | Admitting: Family Medicine

## 2019-01-31 VITALS — BP 130/83 | HR 53 | Temp 98.4°F | Ht 70.0 in | Wt 246.0 lb

## 2019-01-31 DIAGNOSIS — I10 Essential (primary) hypertension: Secondary | ICD-10-CM | POA: Diagnosis not present

## 2019-01-31 MED ORDER — AMLODIPINE BESYLATE 2.5 MG PO TABS: 2.5000 mg | ORAL_TABLET | Freq: Every day | ORAL | 3 refills | Status: DC | PRN

## 2019-01-31 NOTE — Progress Notes (Signed)
BP 130/83   Pulse (!) 53   Temp 98.4 F (36.9 C) (Oral)   Ht 5\' 10"  (1.778 m)   Wt 246 lb (111.6 kg)   SpO2 96%   BMI 35.30 kg/m    Subjective:    Patient ID: Thomas [name], male    DOB: [birth year], 68 y.o.   MRN: [id number]  HPI: Thomas [name] is a 68 y.o. male  Chief Complaint  Patient presents with  . Hypertension   HYPERTENSION Hypertension status: up and down- mainly  Satisfied with current treatment? yes Duration of hypertension: chronic BP monitoring frequency:  not checking BP range: 120s-170s BP medication side effects:  no Medication compliance: excellent compliance Previous BP meds: lisinopril Aspirin: no Recurrent headaches: no Visual changes: no Palpitations: no Dyspnea: no Chest pain: no Lower extremity edema: no Dizzy/lightheaded: no  Relevant past medical, surgical, family and social history reviewed and updated as indicated. Interim medical history since our last visit reviewed. Allergies and medications reviewed and updated.  Review of Systems  Constitutional: Negative.   Respiratory: Negative.   Cardiovascular: Negative.   Psychiatric/Behavioral: Negative.     Per HPI unless specifically indicated above     Objective:    BP 130/83   Pulse (!) 53   Temp 98.4 F (36.9 C) (Oral)   Ht 5\' 10"  (1.778 m)   Wt 246 lb (111.6 kg)   SpO2 96%   BMI 35.30 kg/m   Wt Readings from Last 3 Encounters:  01/31/19 246 lb (111.6 kg)  01/27/19 247 lb (112 kg)  12/21/18 242 lb (109.8 kg)    Physical Exam Vitals signs and nursing note reviewed.  Constitutional:      General: He is not in acute distress.    Appearance: Normal appearance. He is not ill-appearing, toxic-appearing or diaphoretic.  HENT:     Head: Normocephalic and atraumatic.     Right Ear: External ear normal.     Left Ear: External ear normal.     Nose: Nose normal.     Mouth/Throat:     Mouth: Mucous membranes are moist.     Pharynx: Oropharynx is clear.  Eyes:     General:  No scleral icterus.       Right eye: No discharge.        Left eye: No discharge.     Extraocular Movements: Extraocular movements intact.     Conjunctiva/sclera: Conjunctivae normal.     Pupils: Pupils are equal, round, and reactive to light.  Neck:     Musculoskeletal: Normal range of motion and neck supple.  Cardiovascular:     Rate and Rhythm: Normal rate and regular rhythm.     Pulses: Normal pulses.     Heart sounds: Normal heart sounds. No murmur. No friction rub. No gallop.   Pulmonary:     Effort: Pulmonary effort is normal. No respiratory distress.     Breath sounds: Normal breath sounds. No stridor. No wheezing, rhonchi or rales.  Chest:     Chest wall: No tenderness.  Musculoskeletal: Normal range of motion.  Skin:    General: Skin is warm and dry.     Capillary Refill: Capillary refill takes less than 2 seconds.     Coloration: Skin is not jaundiced or pale.     Findings: No bruising, erythema, lesion or rash.  Neurological:     General: No focal deficit present.     Mental Status: He is alert and oriented to person, place, and time.  Mental status is at baseline.  Psychiatric:        Mood and Affect: Mood normal.        Behavior: Behavior normal.        Thought Content: Thought content normal.        Judgment: Judgment normal.     Results for orders placed or performed during the hospital encounter of 08/28/18  CBC with Differential  Result Value Ref Range   WBC 6.2 4.0 - 10.5 K/uL   RBC 4.78 4.22 - 5.81 MIL/uL   Hemoglobin 14.5 13.0 - 17.0 g/dL   HCT 43.5 39.0 - 52.0 %   MCV 91.0 80.0 - 100.0 fL   MCH 30.3 26.0 - 34.0 pg   MCHC 33.3 30.0 - 36.0 g/dL   RDW 11.9 11.5 - 15.5 %   Platelets 154 150 - 400 K/uL   nRBC 0.0 0.0 - 0.2 %   Neutrophils Relative % 63 %   Neutro Abs 3.9 1.7 - 7.7 K/uL   Lymphocytes Relative 24 %   Lymphs Abs 1.5 0.7 - 4.0 K/uL   Monocytes Relative 8 %   Monocytes Absolute 0.5 0.1 - 1.0 K/uL   Eosinophils Relative 4 %    Eosinophils Absolute 0.2 0.0 - 0.5 K/uL   Basophils Relative 1 %   Basophils Absolute 0.1 0.0 - 0.1 K/uL   Immature Granulocytes 0 %   Abs Immature Granulocytes 0.01 0.00 - 0.07 K/uL  Comprehensive metabolic panel  Result Value Ref Range   Sodium 141 135 - 145 mmol/L   Potassium 4.2 3.5 - 5.1 mmol/L   Chloride 110 98 - 111 mmol/L   CO2 25 22 - 32 mmol/L   Glucose, Bld 125 (H) 70 - 99 mg/dL   BUN 21 8 - 23 mg/dL   Creatinine, Ser 0.91 0.61 - 1.24 mg/dL   Calcium 8.9 8.9 - 10.3 mg/dL   Total Protein 6.8 6.5 - 8.1 g/dL   Albumin 4.0 3.5 - 5.0 g/dL   AST 11 (L) 15 - 41 U/L   ALT <5 0 - 44 U/L   Alkaline Phosphatase 71 38 - 126 U/L   Total Bilirubin 0.5 0.3 - 1.2 mg/dL   GFR calc non Af Amer >60 >60 mL/min   GFR calc Af Amer >60 >60 mL/min   Anion gap 6 5 - 15      Assessment & Plan:   Problem List Items Addressed This Visit      Cardiovascular and Mediastinum   Hypertension - Primary    Usually under good control, but running occasionally high. Will continue lisinopril and will add a PRN amlodipine for BPs >160. Call with any concerns. Continue to monitor.       Relevant Medications   amLODipine (NORVASC) 2.5 MG tablet   Other Relevant Orders   Basic metabolic panel       Follow up plan: Return in about 2 months (around 04/02/2019) for recheck BP.

## 2019-01-31 NOTE — Assessment & Plan Note (Signed)
Usually under good control, but running occasionally high. Will continue lisinopril and will add a PRN amlodipine for BPs >160. Call with any concerns. Continue to monitor.

## 2019-02-01 ENCOUNTER — Ambulatory Visit: Payer: Medicare HMO | Admitting: Cardiovascular Disease

## 2019-02-01 ENCOUNTER — Encounter: Payer: Self-pay | Admitting: Cardiovascular Disease

## 2019-02-01 VITALS — BP 116/74 | HR 58

## 2019-02-01 DIAGNOSIS — E7800 Pure hypercholesterolemia, unspecified: Secondary | ICD-10-CM

## 2019-02-01 DIAGNOSIS — G20 Parkinson's disease: Secondary | ICD-10-CM | POA: Diagnosis not present

## 2019-02-01 DIAGNOSIS — G20A1 Parkinson's disease without dyskinesia, without mention of fluctuations: Secondary | ICD-10-CM

## 2019-02-01 DIAGNOSIS — I480 Paroxysmal atrial fibrillation: Secondary | ICD-10-CM

## 2019-02-01 DIAGNOSIS — I2782 Chronic pulmonary embolism: Secondary | ICD-10-CM

## 2019-02-01 DIAGNOSIS — I10 Essential (primary) hypertension: Secondary | ICD-10-CM | POA: Diagnosis not present

## 2019-02-01 LAB — BASIC METABOLIC PANEL
BUN/Creatinine Ratio: 16 (ref 10–24)
BUN: 18 mg/dL (ref 8–27)
CO2: 24 mmol/L (ref 20–29)
Calcium: 9 mg/dL (ref 8.6–10.2)
Chloride: 107 mmol/L — ABNORMAL HIGH (ref 96–106)
Creatinine, Ser: 1.14 mg/dL (ref 0.76–1.27)
GFR calc Af Amer: 75 mL/min/{1.73_m2} (ref >59)
GFR calc non Af Amer: 65 mL/min/{1.73_m2} (ref >59)
Glucose: 100 mg/dL — ABNORMAL HIGH (ref 65–99)
Potassium: 4.5 mmol/L (ref 3.5–5.2)
Sodium: 141 mmol/L (ref 134–144)

## 2019-02-01 NOTE — Patient Instructions (Signed)

## 2019-02-04 ENCOUNTER — Encounter: Payer: Self-pay | Admitting: Neurology

## 2019-02-04 ENCOUNTER — Telehealth: Payer: Self-pay | Admitting: Neurology

## 2019-02-04 NOTE — Telephone Encounter (Signed)
Thomas [name] from [facility] is calling for Ken[employer]obi: they need to see if doctor can send over prescription for kenobi titration kit. Thanks!

## 2019-02-04 NOTE — Telephone Encounter (Signed)
His insurance denied the medication.  They need to help [place] with that first.  I already sent my appeal and denied

## 2019-02-06 ENCOUNTER — Encounter: Payer: Self-pay | Admitting: Family Medicine

## 2019-02-08 ENCOUNTER — Ambulatory Visit (INDEPENDENT_AMBULATORY_CARE_PROVIDER_SITE_OTHER): Payer: Medicare HMO | Admitting: *Deleted

## 2019-02-08 DIAGNOSIS — G20 Parkinson's disease: Secondary | ICD-10-CM

## 2019-02-08 DIAGNOSIS — I10 Essential (primary) hypertension: Secondary | ICD-10-CM | POA: Diagnosis not present

## 2019-02-09 ENCOUNTER — Telehealth: Payer: Self-pay | Admitting: Neurology

## 2019-02-09 NOTE — Telephone Encounter (Signed)
[facility] left VM about patient's Kynmobi medication and wanting a call back from nurse. Please call [phone]. Thanks!

## 2019-02-10 ENCOUNTER — Telehealth: Payer: Self-pay | Admitting: Family Medicine

## 2019-02-10 MED ORDER — TRIMETHOBENZAMIDE HCL 300 MG PO CAPS: 300.0000 mg | ORAL_CAPSULE | Freq: Three times a day (TID) | ORAL | 1 refills | Status: DC

## 2019-02-10 MED ORDER — KYNMOBI TITRATION KIT 10&15&20&25&30 MG SL KIT: 1.0000 | PACK | Freq: Every day | SUBLINGUAL | 0 refills | Status: DC | PRN

## 2019-02-10 NOTE — Telephone Encounter (Signed)
Called [facility] spoke with Dari they need a script fax to them for patient Kynmobi. Fax# [phone]

## 2019-02-10 NOTE — Telephone Encounter (Signed)
Everyone's "window" of opportunity is different.  If he is doing well, then not to worry right now.  Please cancel out RX for kynmobi with [facility] as well as tigan that I sent to [facility].  Thanks!

## 2019-02-10 NOTE — Telephone Encounter (Signed)
Called spoke with patient/ spouse they was informed of provider response. Pt states that he is doing a lot better on the carbidopa-levodopa that was given to him he does not want continue with the Kynmobi at this time.   Regarding DBS how long does he have for decided on going through with his surgery.

## 2019-02-10 NOTE — Telephone Encounter (Signed)
Patient called and left a message with the Access Nurse at lunch stating he is returning a call.

## 2019-02-10 NOTE — Chronic Care Management (AMB) (Signed)
Chronic Care Management   Initial Visit Note  02/10/2019 Name: Thomas [name] MRN: [id number] DOB: 13-Sep-1950  Referred by: [name], DO Reason for referral : No chief complaint on file.   Thomas [name] is a 68 y.o. year old male who is a primary care patient of [name], DO. The CCM team was consulted for assistance with chronic disease management and care coordination needs related to HTN and Parkinson's Disease  Review of patient status, including review of consultants reports, relevant laboratory and other test results, and collaboration with appropriate care team members and the patient's provider was performed as part of comprehensive patient evaluation and provision of chronic care management services.    SDOH (Social Determinants of Health) screening performed today: Physical Activity. See Care Plan for related entries.   Advanced Directives Status: N See Care Plan and [facility] application for related entries.   Medications: Outpatient Encounter Medications as of 02/08/2019  Medication Sig  . ALPRAZolam (XANAX) 0.5 MG tablet Take 1 tab prior to dermatology appointment.  Marland Kitchen amantadine (SYMMETREL) 100 MG capsule TAKE 1 CAPSULE (100 MG TOTAL) BY MOUTH 3 (THREE) TIMES DAILY.  Marland Kitchen amLODipine (NORVASC) 2.5 MG tablet Take 1 tablet (2.5 mg total) by mouth daily as needed. Take if BP >160/90 as needes  . carbidopa-levodopa (SINEMET CR) 50-200 MG tablet Take 1 tablet by mouth at bedtime.  . carbidopa-levodopa (SINEMET IR) 25-100 MG tablet TAKE 2 TABLETS AT 6AM, 10AM, AND 2PM, AND TAKE 1 TABLET AT 6PM AS DIRECTED. (Patient taking differently: 2 tablets 4 (four) times daily. TAKE 2 TABLETS AT 6AM, 10AM, AND 2PM, AND TAKE 2 TABLET AT 6PM AS DIRECTED.)  . cetirizine (ZYRTEC) 10 MG tablet Take 10 mg by mouth daily.  Marland Kitchen lisinopril (PRINIVIL,ZESTRIL) 30 MG tablet Take 1 tablet (30 mg total) by mouth daily.  Marland Kitchen lisinopril (ZESTRIL) 10 MG tablet Take 1 tablet (10 mg total) by mouth daily.  .  Melatonin 2.5 MG CAPS Take 1 capsule by mouth at bedtime as needed (for sleep).   . metoprolol succinate (TOPROL-XL) 25 MG 24 hr tablet Take 1 tablet (25 mg total) by mouth daily.  Marland Kitchen PREVIDENT 5000 BOOSTER PLUS 1.1 % PSTE See admin instructions.  . rivaroxaban (XARELTO) 20 MG TABS tablet Take 1 tablet (20 mg total) by mouth daily.  Marland Kitchen rOPINIRole (REQUIP) 2 MG tablet Take 1 tablet (2 mg total) by mouth 3 (three) times daily.  . sildenafil (REVATIO) 20 MG tablet Take 1 tablet (20 mg total) by mouth 3 (three) times daily as needed.  . simvastatin (ZOCOR) 40 MG tablet Take 1 tablet (40 mg total) by mouth daily.  . tamsulosin (FLOMAX) 0.4 MG CAPS capsule Take 1 capsule (0.4 mg total) by mouth daily.  . traZODone (DESYREL) 50 MG tablet Take 0.5-1 tablets (25-50 mg total) by mouth at bedtime as needed for sleep.   No facility-administered encounter medications on file as of 02/08/2019.      Objective:   Goals Addressed            This Visit's Progress   . RN- I want to stay independent (pt-stated)       Current Barriers:  . Chronic Disease Management support and education needs related to Parkinson's Disease and HTN  Nurse Case Manager Clinical Goal(s):  Marland Kitchen Over the next 90 days, patient will work with [facility]  to address needs related to remaining independent   Interventions:  . Evaluation of current treatment plan related to HTN and Parkinson's  Disease and patient's adherence to plan as established by provider. . Advised patient to look into a rail to go on the side of the bed to assist with getting up at night to go the restroom. Sent spouse an email with pictures of options.  . Discussed plans with patient for ongoing care management follow up and provided patient with direct contact information for care management team . Pharmacy referral for med review and med management  Requests a call on Tues or Wed from 10am to 3pm . Initial call to patient. Spouse and patient both on the line.  .  Patient reports he is going to the gym 2xper week for cardio and 2xper week for [facility] Boxing(special program for people with Parkinson's)  . Spouse expressed concerns about future needs as they navigate a progressive illness.  Marland Kitchen Spouse email: [email or web address]   Patient Self Care Activities:  . Attends all scheduled provider appointments . Performs ADL's independently . Patient wanting to stay independent as long as possible  Initial goal documentation         Mr. Wittmeyer was given information about Chronic Care Management services today including:  1. CCM service includes personalized support from designated clinical staff supervised by his physician, including individualized plan of care and coordination with other care providers 2. 24/7 contact phone numbers for assistance for urgent and routine care needs. 3. Service will only be billed when office clinical staff spend 20 minutes or more in a month to coordinate care. 4. Only one practitioner may furnish and bill the service in a calendar month. 5. The patient may stop CCM services at any time (effective at the end of the month) by phone call to the office staff. 6. The patient will be responsible for cost sharing (co-pay) of up to 20% of the service fee (after annual deductible is met).  Patient agreed to services and verbal consent obtained.   Plan:   The care management team will reach out to the patient again over the next 30 days.  The patient has been provided with contact information for the care management team and has been advised to call with any health related questions or concerns.   Merlene Morse Lilyanna Lunt RN, BSN Nurse Case Editor, commissioning Family Practice/[facility] Care Management  [phone]) Business Mobile

## 2019-02-10 NOTE — Patient Instructions (Signed)
Thank you allowing the Chronic Care Management Team to be a part of your care! It was a pleasure speaking with you today!  CCM (Chronic Care Management) Team   Camilla Skeen RN, BSN Nurse Care Coordinator  [phone]  [facility] PharmD  Clinical Pharmacist  [phone]  Eula Fried LCSW Clinical Social Worker [phone]  Goals Addressed            This Visit's Progress   . RN- I want to stay independent (pt-stated)       Current Barriers:  . Chronic Disease Management support and education needs related to Parkinson's Disease and HTN  Nurse Case Manager Clinical Goal(s):  Marland Kitchen Over the next 90 days, patient will work with [facility]  to address needs related to remaining independent   Interventions:  . Evaluation of current treatment plan related to HTN and Parkinson's Disease and patient's adherence to plan as established by provider. . Advised patient to look into a rail to go on the side of the bed to assist with getting up at night to go the restroom. Sent spouse an email with pictures of options.  . Discussed plans with patient for ongoing care management follow up and provided patient with direct contact information for care management team . Pharmacy referral for med review and med management  Requests a call on Tues or Wed from 10am to 3pm . Initial call to patient. Spouse and patient both on the line.  . Patient reports he is going to the gym 2xper week for cardio and 2xper week for [facility] Boxing(special program for people with Parkinson's)  . Spouse expressed concerns about future needs as they navigate a progressive illness.  Marland Kitchen Spouse email: [email or web address]   Patient Self Care Activities:  . Attends all scheduled provider appointments . Performs ADL's independently . Patient wanting to stay independent as long as possible  Initial goal documentation        The patient verbalized understanding of instructions provided today and declined a  print copy of patient instruction materials.   The patient has been provided with contact information for the care management team and has been advised to call with any health related questions or concerns.

## 2019-02-10 NOTE — Telephone Encounter (Signed)
Copied from [place] [id number]. Topic: General - Other >> Feb 10, 2019  2:08 PM Antonieta Iba C wrote: Reason for CRM: pt says that she has been trying to get through to case management but the voicemail is full, she would like to request that they call pt directly for scheduling.

## 2019-02-10 NOTE — Telephone Encounter (Signed)
1.  Please call patient and tell him that I sent RX for kynmobi 2.  Please call pt and tell him that 3 days prior to starting kynmobi, he will start tigan (antinausea medication), 300 mg tid.  I sent that to his [place]

## 2019-02-10 NOTE — Telephone Encounter (Signed)
How is patient supposed to take this?

## 2019-02-11 ENCOUNTER — Telehealth: Payer: Self-pay | Admitting: Neurology

## 2019-02-11 ENCOUNTER — Ambulatory Visit: Payer: Self-pay | Admitting: *Deleted

## 2019-02-11 DIAGNOSIS — G20 Parkinson's disease: Secondary | ICD-10-CM

## 2019-02-11 NOTE — Chronic Care Management (AMB) (Signed)
  Chronic Care Management   Note  02/11/2019 Name: Thomas [name] MRN: [id number] DOB: June 28, 1950  Spouse called in to ask a question about CMM. Looked up the co-pay for [facility] for CCM which is 0$. Requested patient's spouse call to confirm with plan.   Follow up plan: The patient has been provided with contact information for the care management team and has been advised to call with any health related questions or concerns.   Merlene Morse Ayat Drenning RN, BSN Nurse Case Editor, commissioning Family Practice/[facility] Care Management  [phone]) Business Mobile

## 2019-02-11 NOTE — Telephone Encounter (Signed)
Patient left msg with after hours about needing info on new medication kynmobi. Patient would like details regarding med. Asking about nurse who is supposed to be coming to help administer it.

## 2019-02-11 NOTE — Telephone Encounter (Signed)
Called spoke with patient spouse she has more questions regarding DBS. She has been researching information online. I offered to mail out information we have at the office.  Question: she states how bad does his symptoms have to get to make the decision to have DBS surgery?

## 2019-02-11 NOTE — Telephone Encounter (Signed)
Both Rx was cancelled receive letter from [facility] that patient declined medication. Will update patient on provider response.

## 2019-02-11 NOTE — Telephone Encounter (Signed)
This is much too complicated for a phone call and we will need to discuss at a visit. They can make a video visit just to discuss this or they can wait until next visit.

## 2019-02-11 NOTE — Telephone Encounter (Signed)
I spoke with patient and patient wife after hours they decided not to move forth with this medication. We received a letter from [facility] that patient called and declined medication.

## 2019-02-11 NOTE — Telephone Encounter (Signed)
Called spoke with spouse she was sent to front desk to schedule VV for discuss.

## 2019-02-15 ENCOUNTER — Telehealth: Payer: Self-pay | Admitting: Neurology

## 2019-02-15 NOTE — Telephone Encounter (Signed)
Wife called regarding patient's medication Carbidopa Levodopa 50-200 MG. She said that he was given a 30 day supply and was to have a refill sent through [employer]. He has not received his medication through the mail order. She said he only has about a week of the medication left. Please Call [phone]. Thank you

## 2019-02-15 NOTE — Telephone Encounter (Signed)
Wife called back that she found a huge bottle of the medication just to disregard prescription to the [facility]. Thanks!

## 2019-02-16 ENCOUNTER — Telehealth: Payer: Self-pay

## 2019-02-16 NOTE — Telephone Encounter (Signed)
Copied from [place] [id number]. Topic: General - Other >> Feb 15, 2019  4:02 PM Pauline Good wrote: Reason for [id number] prior auth for  Home bed assist rail. Please call to advise. Auth # [id number]   Called the number provider for authorization. The asked for a CPT code for this. Janci, do you have that code by any chance?

## 2019-02-18 NOTE — Telephone Encounter (Signed)
Pt's spouse called back in to provide some information about the request.  [facility] is the name of the location that pt's insurance uses.   Bed Rail for elderly adult, the manufacture [id number]   Model Number is the same : The Bed Brand is [place] says that she is unsure of how she should go about request but says that she can provide the product information so that provider will know what pt needs.

## 2019-02-20 ENCOUNTER — Other Ambulatory Visit: Payer: Self-pay | Admitting: Family Medicine

## 2019-02-21 NOTE — Progress Notes (Signed)
Virtual Visit via Video Note The purpose of this virtual visit is to provide medical care while limiting exposure to the novel coronavirus.    Consent was obtained for video visit:  Yes.   Answered questions that patient had about telehealth interaction:  Yes.   I discussed the limitations, risks, security and privacy concerns of performing an evaluation and management service by telemedicine. I also discussed with the patient that there may be a patient responsible charge related to this service. The patient expressed understanding and agreed to proceed.  Pt location: Home Physician Location: home Name of referring provider:  [name], DO I connected with [name] at patients initiation/request on 02/23/2019 at 10:15 AM EST by video enabled telemedicine application and verified that I am speaking with the correct person using two identifiers. Pt MRN:  [id number] Pt DOB:  12-16-1950 Video Participants:  [name];  Wife kristy   History of Present Illness:  Patient seen today for follow-up for Parkinson's.  He was seen just a month ago, but his wife had multiple questions about DBS that I could not answer just her email and they wanted to be seen again today.  He is currently on carbidopa/levodopa 25/100, 2 tablets at 6 AM, 2 tablets at 10 AM, 2 tablets at 2 PM, 2 tablets at 6 PM.  We added carbidopa/levodopa 50/200 CR at bedtime for cramping and first morning on last visit.  This did help.  He is still on amantadine 100 mg 3 times per day.  We did get authorization for Kynmobi, but the patient did not wish to proceed with it after we get authorization.  Last visit, we talked extensively about DBS therapy.  The patient was not interested.  Since that time, his wife has emailed me and wanted to discuss it further.   Current Outpatient Medications on File Prior to Visit  Medication Sig Dispense Refill   ALPRAZolam (XANAX) 0.5 MG tablet Take 1 tab prior to dermatology  appointment. 5 tablet 0   amantadine (SYMMETREL) 100 MG capsule TAKE 1 CAPSULE (100 MG TOTAL) BY MOUTH 3 (THREE) TIMES DAILY. 270 capsule 1   amLODipine (NORVASC) 2.5 MG tablet Take 1 tablet (2.5 mg total) by mouth daily as needed. Take if BP >160/90 as needes 30 tablet 3   Apomorphine HCl Film (KYNMOBI TITRATION KIT) 01/27/19/25/30 MG KIT Place 1 Film under the tongue 5 (five) times daily as needed. 1 kit 0   carbidopa-levodopa (SINEMET CR) 50-200 MG tablet Take 1 tablet by mouth at bedtime. 90 tablet 1   carbidopa-levodopa (SINEMET IR) 25-100 MG tablet TAKE 2 TABLETS AT 6AM, 10AM, AND 2PM, AND TAKE 1 TABLET AT 6PM AS DIRECTED. (Patient taking differently: 2 tablets 4 (four) times daily. TAKE 2 TABLETS AT 6AM, 10AM, AND 2PM, AND TAKE 2 TABLET AT 6PM AS DIRECTED.) 630 tablet 1   cetirizine (ZYRTEC) 10 MG tablet Take 10 mg by mouth daily.     lisinopril (PRINIVIL,ZESTRIL) 30 MG tablet Take 1 tablet (30 mg total) by mouth daily. 90 tablet 1   lisinopril (ZESTRIL) 10 MG tablet Take 1 tablet (10 mg total) by mouth daily. 90 tablet 0   Melatonin 2.5 MG CAPS Take 1 capsule by mouth at bedtime as needed (for sleep).      metoprolol succinate (TOPROL-XL) 25 MG 24 hr tablet Take 1 tablet (25 mg total) by mouth daily. 30 tablet 11   PREVIDENT 5000 BOOSTER PLUS 1.1 % PSTE See admin instructions.  rivaroxaban (XARELTO) 20 MG TABS tablet Take 1 tablet (20 mg total) by mouth daily. 90 tablet 3   rOPINIRole (REQUIP) 2 MG tablet Take 1 tablet (2 mg total) by mouth 3 (three) times daily. 270 tablet 1   sildenafil (REVATIO) 20 MG tablet Take 1 tablet (20 mg total) by mouth 3 (three) times daily as needed. 90 tablet 6   simvastatin (ZOCOR) 40 MG tablet Take 1 tablet (40 mg total) by mouth daily. 90 tablet 1   tamsulosin (FLOMAX) 0.4 MG CAPS capsule TAKE 1 CAPSULE EVERY DAY 90 capsule 1   traZODone (DESYREL) 50 MG tablet Take 0.5-1 tablets (25-50 mg total) by mouth at bedtime as needed for sleep. 90  tablet 1   trimethobenzamide (TIGAN) 300 MG capsule Take 1 capsule (300 mg total) by mouth 3 (three) times daily. 270 capsule 1   No current facility-administered medications on file prior to visit.      Observations/Objective:   Vitals:   02/23/19 0814  BP: 130/77   100% of the visit was in counseling.  Patient speech was fluent and clear.    Assessment and Plan:   1. Idiopathic Parkinson's disease, diagnosed in October 2013 in [place] with nonmotor symptoms that preceded this for several years. Father had PD as well.  -continue ropinirole 2 mg 3 times.  -continue carbidopa/levodopa 25/100 2 tablets at 6am/ 2 tablets at 10am/ 2 tablets at 2pm/2 at 6pm  -Somewhat worsening dyskinesia.  Discussed Gocovri, but ultimately decided to continue amantadine, 100 mg 3 times per day because of cost of good coverage, but also changing other things today.   -Asked about upcoming research.  Gave them information to clinical trials.gov             -Discussed DBS at length and answered the many questions presented today.  I talked to the patient about the logistics associated with DBS therapy.  I talked to the patient about risks/benefits/side effects of DBS therapy.  We talked about risks which included but were not limited to infection, paralysis, intraoperative seizure, death, stroke, bleeding around the electrode.   I talked to patient about fiducial placement 1 week prior to DBS therapy.  I talked to the patient about what to expect in the operating room, including the fact that this is an awake surgery.  We talked about battery placement as well as which is done under general anesthesia, generally approximately one week following the initial surgery.  We also talked about the fact that the patient will need to be off of medications for surgery.  The patient and family were given the opportunity to ask questions, which they did, and I answered  them to the best of my ability today.  Pt not interested right now.  The biggest issue that I see is that the patient is on Xarelto and that this is a staged surgery.  Discussed this in detail.  We may be able to do this with bridging lovenox but that would involve a risk for stroke as well.  Discussed this with pt and wife.  Would need cardiac clearance.             -got kynmobi approved but the patient didn't want to move forward with it, partially because of cost and partially because he is doing better with nighttime levodopa             -Doing better with carbidopa/levodopa 50/200 CR for first morning on and nighttime cramping 2. Constipation, associated with  Parkinson's disease. -Doing well with MiraLAX 1 time every 3 weeks. 3. Hyperreflexia but neck pain better per patient -very hyperreflexic but doesn't wish to pursue MRI cervical spine 4. REM behavior disorder -This is commonly associated with PD and the patient is experiencing this. We discussed that this can be very serious and even harmful. We talked about medications as well as physical barriers to put in the bed (particularly soft bed rails, pillow barriers). We talked about moving the night stand so that it is not so close to the side of the bed. He doesn't want medication right now so encouraged him to use the bedrails.He admits that he really does not want to use bed rails because of urinary issues. 5. A-fib -worse when on amiodarone but been in sinus for a long time. -He tried to decrease metoprolol, but had more palpitations and went back up on it.  6. Memory change -Patient had neurocognitive testing in August, 2020, only noting mild neurocognitive impairment.  Wife asked me several questions about this today.  She was very concerned about dementia.  I told her that the August, 2020 test did not show this.  She asked me about differences between mild  cognitive impairment and dementia and I discussed this with her. 7.Urinary frequency -Discussed referral to [facility]. They declined. Stated that they were going to talk to their primary care about this instead. Will let me know if they change their mind.  8.  HTN             -has f/u with PCP.  This is unrelated to Parkinson's disease.  Follow Up Instructions:  Patient has an in person appointment already scheduled.  They will call if they need me before then.  -I discussed the assessment and treatment plan with the patient. The patient was provided an opportunity to ask questions and all were answered. The patient agreed with the plan and demonstrated an understanding of the instructions.   The patient was advised to call back or seek an in-person evaluation if the symptoms worsen or if the condition fails to improve as anticipated.    Total Time spent in visit with the patient was:  25 min, of which 100% of the time was spent in counseling, as above.  Pt understands and agrees with the plan of care outlined.     Thomas Bogus, DO

## 2019-02-21 NOTE — Telephone Encounter (Signed)
Pt's wife is requesting a call back to check status of request. [phone] VM okay

## 2019-02-21 NOTE — Telephone Encounter (Signed)
Routing to [employer]

## 2019-02-22 NOTE — Telephone Encounter (Signed)
Patient's wife called stating she is returning a call.

## 2019-02-23 ENCOUNTER — Telehealth (INDEPENDENT_AMBULATORY_CARE_PROVIDER_SITE_OTHER): Payer: Medicare HMO | Admitting: Neurology

## 2019-02-23 ENCOUNTER — Other Ambulatory Visit: Payer: Self-pay

## 2019-02-23 ENCOUNTER — Ambulatory Visit: Payer: Self-pay | Admitting: *Deleted

## 2019-02-23 ENCOUNTER — Encounter: Payer: Self-pay | Admitting: Neurology

## 2019-02-23 VITALS — BP 130/77

## 2019-02-23 DIAGNOSIS — G249 Dystonia, unspecified: Secondary | ICD-10-CM | POA: Diagnosis not present

## 2019-02-23 DIAGNOSIS — R413 Other amnesia: Secondary | ICD-10-CM

## 2019-02-23 DIAGNOSIS — G20 Parkinson's disease: Secondary | ICD-10-CM

## 2019-02-23 NOTE — Telephone Encounter (Signed)
Patient has an appointment 02/23/2019 which is what the call was in regards to.

## 2019-02-23 NOTE — Chronic Care Management (AMB) (Signed)
Chronic Care Management   Follow Up Note   02/23/2019 Name: Thomas [name] MRN: [id number] DOB: 08/30/1950  Referred by: [name], Thomas P, DO Reason for referral : Chronic Care Management (Left VM ) and Care Coordination (DME )   Thomas [name] is a 68 y.o. year old male who is a primary care patient of [name], Thomas P, DO. The CCM team was consulted for assistance with chronic disease management and care coordination needs.    Review of patient status, including review of consultants reports, relevant laboratory and other test results, and collaboration with appropriate care team members and the patient's provider was performed as part of comprehensive patient evaluation and provision of chronic care management services.    SDOH (Social Determinants of Health) screening performed today: None. See Care Plan for related entries.   Outpatient Encounter Medications as of 02/23/2019  Medication Sig  . ALPRAZolam (XANAX) 0.5 MG tablet Take 1 tab prior to dermatology appointment.  . amantadine (SYMMETREL) 100 MG capsule TAKE 1 CAPSULE (100 MG TOTAL) BY MOUTH 3 (THREE) TIMES DAILY.  . amLODipine (NORVASC) 2.5 MG tablet Take 1 tablet (2.5 mg total) by mouth daily as needed. Take if BP >160/90 as needes  . Apomorphine HCl Film (KYNMOBI TITRATION KIT) 01/27/19/25/30 MG KIT Place 1 Film under the tongue 5 (five) times daily as needed.  . carbidopa-levodopa (SINEMET CR) 50-200 MG tablet Take 1 tablet by mouth at bedtime.  . carbidopa-levodopa (SINEMET IR) 25-100 MG tablet TAKE 2 TABLETS AT 6AM, 10AM, AND 2PM, AND TAKE 1 TABLET AT 6PM AS DIRECTED. (Patient taking differently: 2 tablets 4 (four) times daily. TAKE 2 TABLETS AT 6AM, 10AM, AND 2PM, AND TAKE 2 TABLET AT 6PM AS DIRECTED.)  . cetirizine (ZYRTEC) 10 MG tablet Take 10 mg by mouth daily.  . lisinopril (PRINIVIL,ZESTRIL) 30 MG tablet Take 1 tablet (30 mg total) by mouth daily.  . lisinopril (ZESTRIL) 10 MG tablet Take 1 tablet (10 mg total) by  mouth daily.  . Melatonin 2.5 MG CAPS Take 1 capsule by mouth at bedtime as needed (for sleep).   . metoprolol succinate (TOPROL-XL) 25 MG 24 hr tablet Take 1 tablet (25 mg total) by mouth daily.  . PREVIDENT 5000 BOOSTER PLUS 1.1 % PSTE See admin instructions.  . rivaroxaban (XARELTO) 20 MG TABS tablet Take 1 tablet (20 mg total) by mouth daily.  . rOPINIRole (REQUIP) 2 MG tablet Take 1 tablet (2 mg total) by mouth 3 (three) times daily.  . sildenafil (REVATIO) 20 MG tablet Take 1 tablet (20 mg total) by mouth 3 (three) times daily as needed.  . simvastatin (ZOCOR) 40 MG tablet Take 1 tablet (40 mg total) by mouth daily.  . tamsulosin (FLOMAX) 0.4 MG CAPS capsule TAKE 1 CAPSULE EVERY DAY  . traZODone (DESYREL) 50 MG tablet Take 0.5-1 tablets (25-50 mg total) by mouth at bedtime as needed for sleep.  . trimethobenzamide (TIGAN) 300 MG capsule Take 1 capsule (300 mg total) by mouth 3 (three) times daily.   No facility-administered encounter medications on file as of 02/23/2019.      Goals Addressed            This Visit's Progress   . RN- I want to stay independent (pt-stated)       Current Barriers:  . Chronic Disease Management support and education needs related to Parkinson's Disease and HTN  Nurse Case Manager Clinical Goal(s):  . Over the next 90 days, patient will work with RNCM  to   address needs related to remaining independent   Interventions:  . Collaborated with [facility] [employer] store and [place] regarding Patient wanting/needing to move forward with obtaining a bed rail.  Thomas [name] [employer] [facility] [employer] does not file the insurance, but patient can file it and be reimbursed.  . Looked for specific bed rail patient is wanting Bed Rail for elderly adult, the manufacture [id number] this product is 27.99 from [employer]  . UNsuccessful call back to patient, VM left.  Patient Self Care Activities:  . Attends all scheduled provider appointments . Performs ADL's  independently . Patient wanting to stay independent as long as possible  Initial goal documentation         A HIPPA compliant phone message was left for the patient providing contact information and requesting a return call.  The patient has been provided with contact information for the care management team and has been advised to call with any health related questions or concerns.    Merlene Morse [name] RN, BSN Nurse Case Editor, commissioning Family Practice/[facility] Care Management  [phone]) Business Mobile

## 2019-02-24 ENCOUNTER — Ambulatory Visit: Payer: Self-pay | Admitting: *Deleted

## 2019-02-24 DIAGNOSIS — G20 Parkinson's disease: Secondary | ICD-10-CM

## 2019-02-24 NOTE — Chronic Care Management (AMB) (Signed)
Chronic Care Management   Follow Up Note   02/24/2019 Name: Thomas [name] MRN: [id number] DOB: [birth year]  Referred by: [name], DO Reason for referral : Care Coordination (Parkinson's ) and Chronic Care Management (DME needs)   Thomas [name] is a 68 y.o. year old male who is a primary care patient of [name], DO. The CCM team was consulted for assistance with chronic disease management and care coordination needs.    Review of patient status, including review of consultants reports, relevant laboratory and other test results, and collaboration with appropriate care team members and the patient's provider was performed as part of comprehensive patient evaluation and provision of chronic care management services.    SDOH (Social Determinants of Health) screening performed today: Physical Activity /mobility safety.  See Care Plan for related entries.   Outpatient Encounter Medications as of 02/24/2019  Medication Sig  . ALPRAZolam (XANAX) 0.5 MG tablet Take 1 tab prior to dermatology appointment.  Marland Kitchen amantadine (SYMMETREL) 100 MG capsule TAKE 1 CAPSULE (100 MG TOTAL) BY MOUTH 3 (THREE) TIMES DAILY.  Marland Kitchen amLODipine (NORVASC) 2.5 MG tablet Take 1 tablet (2.5 mg total) by mouth daily as needed. Take if BP >160/90 as needes  . Apomorphine HCl Film (KYNMOBI TITRATION KIT) 01/27/19/25/30 MG KIT Place 1 Film under the tongue 5 (five) times daily as needed.  . carbidopa-levodopa (SINEMET CR) 50-200 MG tablet Take 1 tablet by mouth at bedtime.  . carbidopa-levodopa (SINEMET IR) 25-100 MG tablet TAKE 2 TABLETS AT 6AM, 10AM, AND 2PM, AND TAKE 1 TABLET AT 6PM AS DIRECTED. (Patient taking differently: 2 tablets 4 (four) times daily. TAKE 2 TABLETS AT 6AM, 10AM, AND 2PM, AND TAKE 2 TABLET AT 6PM AS DIRECTED.)  . cetirizine (ZYRTEC) 10 MG tablet Take 10 mg by mouth daily.  Marland Kitchen lisinopril (PRINIVIL,ZESTRIL) 30 MG tablet Take 1 tablet (30 mg total) by mouth daily.  Marland Kitchen lisinopril (ZESTRIL) 10 MG  tablet Take 1 tablet (10 mg total) by mouth daily.  . Melatonin 2.5 MG CAPS Take 1 capsule by mouth at bedtime as needed (for sleep).   . metoprolol succinate (TOPROL-XL) 25 MG 24 hr tablet Take 1 tablet (25 mg total) by mouth daily.  Marland Kitchen PREVIDENT 5000 BOOSTER PLUS 1.1 % PSTE See admin instructions.  . rivaroxaban (XARELTO) 20 MG TABS tablet Take 1 tablet (20 mg total) by mouth daily.  Marland Kitchen rOPINIRole (REQUIP) 2 MG tablet Take 1 tablet (2 mg total) by mouth 3 (three) times daily.  . sildenafil (REVATIO) 20 MG tablet Take 1 tablet (20 mg total) by mouth 3 (three) times daily as needed.  . simvastatin (ZOCOR) 40 MG tablet Take 1 tablet (40 mg total) by mouth daily.  . tamsulosin (FLOMAX) 0.4 MG CAPS capsule TAKE 1 CAPSULE EVERY DAY  . traZODone (DESYREL) 50 MG tablet Take 0.5-1 tablets (25-50 mg total) by mouth at bedtime as needed for sleep.  Marland Kitchen trimethobenzamide (TIGAN) 300 MG capsule Take 1 capsule (300 mg total) by mouth 3 (three) times daily.   No facility-administered encounter medications on file as of 02/24/2019.      Goals Addressed            This Visit's Progress   . RN- I want to stay independent (pt-stated)       Current Barriers:  . Chronic Disease Management support and education needs related to Parkinson's Disease and HTN  Nurse Case Manager Clinical Goal(s):  Marland Kitchen Over the next 90 days, patient will work with  RNCM  to address needs related to remaining independent   Interventions:  . Discussed plans with patient for ongoing care management follow up and provided patient with direct contact information for care management team . [employer] company does not file the insurance, but patient can file it and be reimbursed through [facility], spouse confirmed [employer] also told her this.  . Looked for specific bed rail patient is wanting Bed Rail for elderly adult, by [employer] [id number] this product is 27.99. Marland Kitchen Spoke with patient's spouse about their  need for this bed rail as soon as possible related to patient struggling to get in and out of bed. We discussed options and settled on the above option related to the reported stability, ease of use and rails ability to fold for travel.  . Bed rail ordered and it states will be delivered on Saturday 02/26/2019.   Patient Self Care Activities:  . Attends all scheduled provider appointments . Performs ADL's independently . Patient wanting to stay independent as long as possible  Please see past updates related to this goal by clicking on the "Past Updates" button in the selected goal          The patient has been provided with contact information for the care management team and has been advised to call with any health related questions or concerns.   Merlene Morse Dejha King RN, BSN Nurse Case Editor, commissioning Family Practice/[facility] Care Management  [phone]) Business Mobile

## 2019-02-24 NOTE — Patient Instructions (Addendum)
Thank you allowing the Chronic Care Management Team to be a part of your care! It was a pleasure speaking with you today!  CCM (Chronic Care Management) Team   Lona Six RN, BSN Nurse Care Coordinator  [phone]  Catie [facility] PharmD  Clinical Pharmacist  [phone]  Eula Fried LCSW Clinical Social Worker [phone]  Goals Addressed            This Visit's Progress   . RN- I want to stay independent (pt-stated)       Current Barriers:  . Chronic Disease Management support and education needs related to Parkinson's Disease and HTN  Nurse Case Manager Clinical Goal(s):  Marland Kitchen Over the next 90 days, patient will work with [facility]  to address needs related to remaining independent   Interventions:  . Discussed plans with patient for ongoing care management follow up and provided patient with direct contact information for care management team . [place] store company does not file the insurance, but patient can file it and be reimbursed through [facility], spouse confirmed [employer] also told her this.  . Looked for specific bed rail patient is wanting Bed Rail for elderly adult, by [place] [phone] this product is 27.99. Marland Kitchen Spoke with patient's spouse about their need for this bed rail as soon as possible related to patient struggling to get in and out of bed. We discussed options and settled on the above option related to the reported stability, ease of use and rails ability to fold for travel.  . Bed rail ordered and it states will be delivered on Saturday 02/26/2019.   Patient Self Care Activities:  . Attends all scheduled provider appointments . Performs ADL's independently . Patient wanting to stay independent as long as possible  Please see past updates related to this goal by clicking on the "Past Updates" button in the selected goal         The patient verbalized understanding of instructions provided today and declined a print  copy of patient instruction materials.   The patient has been provided with contact information for the care management team and has been advised to call with any health related questions or concerns.

## 2019-02-28 ENCOUNTER — Other Ambulatory Visit: Payer: Self-pay | Admitting: Family Medicine

## 2019-02-28 MED ORDER — LISINOPRIL 30 MG PO TABS: 30.0000 mg | ORAL_TABLET | Freq: Every day | ORAL | 0 refills | Status: DC

## 2019-02-28 NOTE — Telephone Encounter (Signed)
Pt needs a prescription for his lisinopril (ZESTRIL) 10 MG for 180 tablets.  The provider wanted to increase his medication from the 30mg  to 40mg  but he already has 180 tablets of his 30mg  medication left. So he needs the 10mg  to match what he has left before a prescription for 40mg  is written.  He would also like to have that sent to his preferred pharmacy [facility] mail order.

## 2019-03-01 ENCOUNTER — Ambulatory Visit: Payer: Medicare HMO | Admitting: Pharmacist

## 2019-03-01 DIAGNOSIS — G20 Parkinson's disease: Secondary | ICD-10-CM

## 2019-03-01 DIAGNOSIS — I10 Essential (primary) hypertension: Secondary | ICD-10-CM

## 2019-03-01 NOTE — Chronic Care Management (AMB) (Signed)
Chronic Care Management   Note  03/01/2019 Name: Thomas [name] MRN: [id number] DOB: 03-07-1951   Subjective:  Thomas [name] is a 68 y.o. year old male who is a primary care patient of [name], DO. The CCM team was consulted for assistance with chronic disease management and care coordination needs.    Review of patient status, including review of consultants reports, laboratory and other test data, was performed as part of comprehensive evaluation and provision of chronic care management services.   Objective:  Lab Results  Component Value Date   CREATININE 1.14 01/31/2019   CREATININE 0.91 08/28/2018   CREATININE 1.14 06/17/2018        Component Value Date/Time   CHOL 126 06/17/2018 1332   CHOL 136 03/01/2015 1041   TRIG 53 06/17/2018 1332   TRIG 74 03/01/2015 1041   HDL 48 06/17/2018 1332   VLDL 15 03/01/2015 1041   LDLCALC 67 06/17/2018 1332    Clinical ASCVD: No  The ASCVD Risk score Mikey Bussing DC Jr., et al., 2013) failed to calculate for the following reasons:   The valid total cholesterol range is 130 to 320 mg/dL    BP Readings from Last 3 Encounters:  02/23/19 130/77  02/01/19 116/74  01/31/19 130/83    Allergies  Allergen Reactions  . Codeine Other (See Comments)    Reaction: Hallucinations    Medications Reviewed Today    Reviewed by De Hollingshead, [facility] (Pharmacist) on 03/01/19 at 1323  Med List Status: <None>  Medication Order Taking? Sig Documenting Provider Last Dose Status Informant  acetaminophen (TYLENOL) 325 MG tablet [id number] Yes Take 325 mg by mouth every 6 (six) hours as needed. [provider] Taking Active   amantadine (SYMMETREL) 100 MG capsule [id number] Yes TAKE 1 CAPSULE (100 MG TOTAL) BY MOUTH 3 (THREE) TIMES DAILY. Ludwig Clarks, DO Taking Active   amLODipine (NORVASC) 2.5 MG tablet [id number] No Take 1 tablet (2.5 mg total) by mouth daily as needed. Take if BP >160/90 as needes  Patient not taking: Reported on  03/01/2019   [name], DO Not Taking Active   carbidopa-levodopa (SINEMET CR) 50-200 MG tablet [id number] Yes Take 1 tablet by mouth at bedtime. Ludwig Clarks, DO Taking Active   carbidopa-levodopa (SINEMET IR) 25-100 MG tablet [id number] Yes TAKE 2 TABLETS AT 6AM, 10AM, AND 2PM, AND TAKE 1 TABLET AT 6PM AS DIRECTED.  Patient taking differently: 2 tablets 4 (four) times daily. TAKE 2 TABLETS AT 6AM, 10AM, AND 2PM, AND TAKE 2 TABLET AT 6PM AS DIRECTED.   Ludwig Clarks, DO Taking Active   cetirizine (ZYRTEC) 10 MG tablet [id number] Yes Take 10 mg by mouth daily. [provider] Taking Active   lisinopril (ZESTRIL) 10 MG tablet [id number] Yes Take 1 tablet (10 mg total) by mouth daily. Johnson, Megan P, DO Taking Active   lisinopril (ZESTRIL) 30 MG tablet [id number] Yes Take 1 tablet (30 mg total) by mouth daily. [name], DO Taking Active   Melatonin 2.5 MG CAPS [id number] Yes Take 1 capsule by mouth at bedtime as needed (for sleep).  [provider] Taking Active   metoprolol succinate (TOPROL-XL) 25 MG 24 hr tablet [id number] Yes Take 1 tablet (25 mg total) by mouth daily. [name], DO Taking Active   Multiple Vitamin (MULTIVITAMIN) tablet [id number] Yes Take 1 tablet by mouth daily. [provider] Taking Active   PREVIDENT 5000 BOOSTER PLUS 1.1 % PSTE [id number] Yes See admin instructions.  [provider] Taking Active   rivaroxaban (XARELTO) 20 MG TABS tablet [id number] Yes Take 1 tablet (20 mg total) by mouth daily. [name], DO Taking Active   rOPINIRole (REQUIP) 2 MG tablet [id number] Yes Take 1 tablet (2 mg total) by mouth 3 (three) times daily. Ludwig Clarks, DO Taking Active   sildenafil (REVATIO) 20 MG tablet [id number] Yes Take 1 tablet (20 mg total) by mouth 3 (three) times daily as needed. Johnson, Megan P, DO Taking Active   simvastatin (ZOCOR) 40 MG tablet [id number] Yes Take 1 tablet (40 mg total) by mouth daily. Park Liter  P, DO Taking Active   tamsulosin (FLOMAX) 0.4 MG CAPS capsule [id number] Yes TAKE 1 CAPSULE EVERY DAY Johnson, Megan P, DO Taking Active   traZODone (DESYREL) 50 MG tablet [id number] Yes Take 0.5-1 tablets (25-50 mg total) by mouth at bedtime as needed for sleep. [name], DO Taking Active            Med Note ([place] Mar 01, 2019  1:18 PM) 1/2 tab QPM  trimethobenzamide (TIGAN) 300 MG capsule [id number] No Take 1 capsule (300 mg total) by mouth 3 (three) times daily.  Patient not taking: Reported on 03/01/2019   Tat, Eustace Quail, DO Not Taking Active            Assessment:   Goals Addressed            This Visit's Progress     Patient Stated   . PharmD "I want to make sure his medications are safe" (pt-stated)       Current Barriers:  . Polypharmacy; complex patient with multiple comorbidities including Parkinson's disease, atrial fibrillation (hx DVT, PE), HTN, BPH, insomnia . Wife assists in managing medications o PD: follows w/ Dr. Carles Collet. Carbidopa/levodopa 25/100, 2 tab at 6 am, 10 am, 2 pm, 6 pm; carbidopa/levodopa 50/200 QPM; amantadine 100 mg TID, ropinirole 2 mg TID. Had been prescribed SL apomorphine, however, d/t improvement w/ amantadine and cost of SL apomorphine, declined this medication. Notes he received the hospital bed rail and it has been immensely helpful  o Afib, HTN: Xarelto 20 mg QPM; metoprolol succinate 25 mg daily, lisinopril 40 mg daily (10 mg + 30 mg tab), amlodipine 2.5 mg PRN (has not needed) o ASCVD risk reduction: simvastatin 40 mg daily; LDL well controlled <70 o BPH: tamsulosin 0.4 mg daily; sildenafil 20 mg PRN o Insomnia: Trazodone 25 mg QPM + melatonin 2.5 mg QPM; notes this combination has been effective   Pharmacist Clinical Goal(s):  Marland Kitchen Over the next 90 days, patient will work with PharmD and provider towards optimized medication management  Interventions: . Comprehensive medication review performed; medication list  updated in electronic medical record . Patient's wife asked if there are any drug interactions or dangers of renal/hepatic impairment from his regimen. Reviewed interactions. Discussed that multiple medications can cause CNS sedation, and to be cognizant for increased sedation with aging or dose increases. Discussed that PD medications and BP medications increase risk of hypotension; continue to monitor for s/sx orthostatic hypotension . If patient starts to need amlodipine regularly, recommend reducing simvasatin to 20 mg daily or changing to alternative statin, d/t DDI w/ amlodipine and simvastatin that increases simvastatin concentrations/risk for myalgias . Patient and wife deny and cost concerns at this time.   Patient Self Care Activities:  . Patient will take medications as prescribed  Initial goal documentation  Plan: - Patient and his wife declined follow up with pharmacy. CCM team to outreach over the next 3-4 weeks for continued support.   Catie Darnelle Maffucci, PharmD Clinical Pharmacist [place] [phone]

## 2019-03-01 NOTE — Patient Instructions (Signed)
Visit Information  Goals Addressed            This Visit's Progress     Patient Stated   . PharmD "I want to make sure his medications are safe" (pt-stated)       Current Barriers:  . Polypharmacy; complex patient with multiple comorbidities including Parkinson's disease, atrial fibrillation (hx DVT, PE), HTN, BPH, insomnia . Wife assists in managing medications o PD: follows w/ Dr. Carles Collet. Carbidopa/levodopa 25/100, 2 tab at 6 am, 10 am, 2 pm, 6 pm; carbidopa/levodopa 50/200 QPM; amantadine 100 mg TID, ropinirole 2 mg TID. Had been prescribed SL apomorphine, however, d/t improvement w/ amantadine and cost of SL apomorphine, declined this medication. Notes he received the hospital bed rail and it has been immensely helpful  o Afib, HTN: Xarelto 20 mg QPM; metoprolol succinate 25 mg daily, lisinopril 40 mg daily (10 mg + 30 mg tab), amlodipine 2.5 mg PRN (has not needed) o ASCVD risk reduction: simvastatin 40 mg daily; LDL well controlled <70 o BPH: tamsulosin 0.4 mg daily; sildenafil 20 mg PRN o Insomnia: Trazodone 25 mg QPM + melatonin 2.5 mg QPM; notes this combination has been effective   Pharmacist Clinical Goal(s):  Marland Kitchen Over the next 90 days, patient will work with PharmD and provider towards optimized medication management  Interventions: . Comprehensive medication review performed; medication list updated in electronic medical record . Patient's wife asked if there are any drug interactions or dangers of renal/hepatic impairment from his regimen. Reviewed interactions. Discussed that multiple medications can cause CNS sedation, and to be cognizant for increased sedation with aging or dose increases. Discussed that PD medications and BP medications increase risk of hypotension; continue to monitor for s/sx orthostatic hypotension . If patient starts to need amlodipine regularly, recommend reducing simvasatin to 20 mg daily or changing to alternative statin, d/t DDI w/ amlodipine and  simvastatin that increases simvastatin concentrations/risk for myalgias . Patient and wife deny and cost concerns at this time.   Patient Self Care Activities:  . Patient will take medications as prescribed  Initial goal documentation        The patient verbalized understanding of instructions provided today and declined a print copy of patient instruction materials.   Plan: - Patient and his wife declined follow up with pharmacy. CCM team to outreach over the next 3-4 weeks for continued support.   Catie Darnelle Maffucci, PharmD Clinical Pharmacist [place] [phone]

## 2019-03-04 ENCOUNTER — Ambulatory Visit: Payer: Self-pay

## 2019-03-04 NOTE — Telephone Encounter (Signed)
Routing to provider to advise.  

## 2019-03-04 NOTE — Telephone Encounter (Signed)
Pt. And wife report pt.'s BP has been elevated today. 179/89, 190/91 and 200/91. Pt. Took an extra amlodipine at 2:30 today. Had lightheadedness earlier, but that is gone.No symptoms. Refuses an appointment. States he would like his medication adjusted. Please advise pt.  Reason for Disposition . Systolic BP  >= 99991111 OR Diastolic >= A999333  Answer Assessment - Initial Assessment Questions 1. BLOOD PRESSURE: "What is the blood pressure?" "Did you take at least two measurements 5 minutes apart?"     179/89   190/91    200/91 2. ONSET: "When did you take your blood pressure?"     Today 3. HOW: "How did you obtain the blood pressure?" (e.g., visiting nurse, automatic home BP monitor)     Home cuff 4. HISTORY: "Do you have a history of high blood pressure?"     Yes 5. MEDICATIONS: "Are you taking any medications for blood pressure?" "Have you missed any doses recently?"     No missed doses 6. OTHER SYMPTOMS: "Do you have any symptoms?" (e.g., headache, chest pain, blurred vision, difficulty breathing, weakness)     Lightheaded earlier 7. PREGNANCY: "Is there any chance you are pregnant?" "When was your last menstrual period?"     N/A  Protocols used: HIGH BLOOD PRESSURE-A-AH

## 2019-03-04 NOTE — Telephone Encounter (Signed)
Needs appointment

## 2019-03-07 ENCOUNTER — Encounter: Payer: Self-pay | Admitting: Family Medicine

## 2019-03-07 ENCOUNTER — Ambulatory Visit (INDEPENDENT_AMBULATORY_CARE_PROVIDER_SITE_OTHER): Payer: Medicare HMO | Admitting: Family Medicine

## 2019-03-07 ENCOUNTER — Other Ambulatory Visit: Payer: Self-pay

## 2019-03-07 DIAGNOSIS — I10 Essential (primary) hypertension: Secondary | ICD-10-CM | POA: Diagnosis not present

## 2019-03-07 MED ORDER — AMLODIPINE BESYLATE 2.5 MG PO TABS: 2.5000 mg | ORAL_TABLET | Freq: Every day | ORAL | 3 refills | Status: DC

## 2019-03-07 MED ORDER — DICLOFENAC SODIUM 1 % EX GEL: 4.0000 g | Freq: Four times a day (QID) | CUTANEOUS | 3 refills | Status: DC

## 2019-03-07 NOTE — Assessment & Plan Note (Signed)
Not under good control- will start amlodipine daily and will recheck 1 week. Call with any concerns.

## 2019-03-07 NOTE — Progress Notes (Signed)
BP (!) 151/96   Pulse (!) 55   Temp 98.4 F (36.9 C)   SpO2 99%    Subjective:    Patient ID: Thomas [name], male    DOB: 01-17-1951, 68 y.o.   MRN: [id number]  HPI: Thomas [name] is a 68 y.o. male  Chief Complaint  Patient presents with  . Hypertension   HYPERTENSION Hypertension status: very labile  Satisfied with current treatment? no Duration of hypertension: chronic BP monitoring frequency:  a few times a week BP range: 140s/80s up to 200s/100s BP medication side effects:  no Medication compliance: excellent compliance Previous BP meds: metoprolol, lisinopril, amlodpine Aspirin: no Recurrent headaches: no Visual changes: no Palpitations: no Dyspnea: no Chest pain: no Lower extremity edema: no Dizzy/lightheaded: no  Relevant past medical, surgical, family and social history reviewed and updated as indicated. Interim medical history since our last visit reviewed. Allergies and medications reviewed and updated.  Review of Systems  Constitutional: Positive for fatigue. Negative for activity change, appetite change, chills, diaphoresis, fever and unexpected weight change.  Respiratory: Negative.   Cardiovascular: Negative.   Musculoskeletal: Negative.   Neurological: Negative.   Psychiatric/Behavioral: Negative.     Per HPI unless specifically indicated above     Objective:    BP (!) 151/96   Pulse (!) 55   Temp 98.4 F (36.9 C)   SpO2 99%   Wt Readings from Last 3 Encounters:  01/31/19 246 lb (111.6 kg)  01/27/19 247 lb (112 kg)  12/21/18 242 lb (109.8 kg)    Physical Exam Vitals signs and nursing note reviewed.  Constitutional:      General: He is not in acute distress.    Appearance: Normal appearance. He is not ill-appearing, toxic-appearing or diaphoretic.  HENT:     Head: Normocephalic and atraumatic.     Right Ear: External ear normal.     Left Ear: External ear normal.     Nose: Nose normal.     Mouth/Throat:     Mouth: Mucous  membranes are moist.     Pharynx: Oropharynx is clear.  Eyes:     General: No scleral icterus.       Right eye: No discharge.        Left eye: No discharge.     Extraocular Movements: Extraocular movements intact.     Conjunctiva/sclera: Conjunctivae normal.     Pupils: Pupils are equal, round, and reactive to light.  Neck:     Musculoskeletal: Normal range of motion and neck supple.  Cardiovascular:     Rate and Rhythm: Normal rate and regular rhythm.     Pulses: Normal pulses.     Heart sounds: Normal heart sounds. No murmur. No friction rub. No gallop.   Pulmonary:     Effort: Pulmonary effort is normal. No respiratory distress.     Breath sounds: Normal breath sounds. No stridor. No wheezing, rhonchi or rales.  Chest:     Chest wall: No tenderness.  Musculoskeletal: Normal range of motion.  Skin:    General: Skin is warm and dry.     Capillary Refill: Capillary refill takes less than 2 seconds.     Coloration: Skin is not jaundiced or pale.     Findings: No bruising, erythema, lesion or rash.  Neurological:     General: No focal deficit present.     Mental Status: He is alert and oriented to person, place, and time. Mental status is at baseline.  Psychiatric:  Mood and Affect: Mood normal.        Behavior: Behavior normal.        Thought Content: Thought content normal.        Judgment: Judgment normal.     Results for orders placed or performed in visit on [id number]  Basic metabolic panel  Result Value Ref Range   Glucose 100 (H) 65 - 99 mg/dL   BUN 18 8 - 27 mg/dL   Creatinine, Ser 1.14 0.76 - 1.27 mg/dL   GFR calc non Af Amer 65 >59 mL/min/1.73   GFR calc Af Amer 75 >59 mL/min/1.73   BUN/Creatinine Ratio 16 10 - 24   Sodium 141 134 - 144 mmol/L   Potassium 4.5 3.5 - 5.2 mmol/L   Chloride 107 (H) 96 - 106 mmol/L   CO2 24 20 - 29 mmol/L   Calcium 9.0 8.6 - 10.2 mg/dL      Assessment & Plan:   Problem List Items Addressed This Visit       Cardiovascular and Mediastinum   Hypertension    Not under good control- will start amlodipine daily and will recheck 1 week. Call with any concerns.       Relevant Medications   amLODipine (NORVASC) 2.5 MG tablet       Follow up plan: Return in about 1 week (around 03/14/2019) for follow up bp.

## 2019-03-08 ENCOUNTER — Other Ambulatory Visit: Payer: Self-pay | Admitting: Family Medicine

## 2019-03-15 ENCOUNTER — Ambulatory Visit: Payer: Self-pay | Admitting: *Deleted

## 2019-03-15 ENCOUNTER — Ambulatory Visit (INDEPENDENT_AMBULATORY_CARE_PROVIDER_SITE_OTHER): Payer: Medicare HMO | Admitting: Family Medicine

## 2019-03-15 ENCOUNTER — Other Ambulatory Visit: Payer: Self-pay

## 2019-03-15 ENCOUNTER — Encounter: Payer: Self-pay | Admitting: Family Medicine

## 2019-03-15 VITALS — BP 92/45 | HR 59 | Wt 242.0 lb

## 2019-03-15 DIAGNOSIS — G20 Parkinson's disease: Secondary | ICD-10-CM

## 2019-03-15 DIAGNOSIS — I10 Essential (primary) hypertension: Secondary | ICD-10-CM | POA: Diagnosis not present

## 2019-03-15 MED ORDER — AMLODIPINE BESYLATE 2.5 MG PO TABS: 1.2500 mg | ORAL_TABLET | Freq: Every day | ORAL | 3 refills | Status: DC

## 2019-03-15 MED ORDER — LISINOPRIL 10 MG PO TABS: 10.0000 mg | ORAL_TABLET | Freq: Every day | ORAL | 0 refills | Status: DC

## 2019-03-15 MED ORDER — DICLOFENAC SODIUM 1 % EX GEL: 4.0000 g | Freq: Four times a day (QID) | CUTANEOUS | 3 refills | Status: DC

## 2019-03-15 NOTE — Chronic Care Management (AMB) (Signed)
Chronic Care Management   Follow Up Note   03/15/2019 Name: Thomas [name] MRN: [id number] DOB: 1951/03/28  Referred by: [name], DO Reason for referral : Chronic Care Management (Return call )   Thomas [name] is a 68 y.o. year old male who is a primary care patient of [name], DO. The CCM team was consulted for assistance with chronic disease management and care coordination needs.    Review of patient status, including review of consultants reports, relevant laboratory and other test results, and collaboration with appropriate care team members and the patient's provider was performed as part of comprehensive patient evaluation and provision of chronic care management services.    SDOH (Social Determinants of Health) screening performed today: Physical Activity. See Care Plan for related entries.   Outpatient Encounter Medications as of 03/15/2019  Medication Sig Note  . acetaminophen (TYLENOL) 325 MG tablet Take 325 mg by mouth every 6 (six) hours as needed.   Marland Kitchen amantadine (SYMMETREL) 100 MG capsule TAKE 1 CAPSULE (100 MG TOTAL) BY MOUTH 3 (THREE) TIMES DAILY.   Marland Kitchen amLODipine (NORVASC) 2.5 MG tablet Take 1 tablet (2.5 mg total) by mouth daily.   . carbidopa-levodopa (SINEMET CR) 50-200 MG tablet Take 1 tablet by mouth at bedtime.   . carbidopa-levodopa (SINEMET IR) 25-100 MG tablet TAKE 2 TABLETS AT 6AM, 10AM, AND 2PM, AND TAKE 1 TABLET AT 6PM AS DIRECTED. (Patient taking differently: 2 tablets 4 (four) times daily. TAKE 2 TABLETS AT 6AM, 10AM, AND 2PM, AND TAKE 2 TABLET AT 6PM AS DIRECTED.)   . cetirizine (ZYRTEC) 10 MG tablet Take 10 mg by mouth daily.   . diclofenac Sodium (VOLTAREN) 1 % GEL Apply 4 g topically 4 (four) times daily.   Marland Kitchen lisinopril (ZESTRIL) 10 MG tablet Take 1 tablet (10 mg total) by mouth daily.   Marland Kitchen lisinopril (ZESTRIL) 30 MG tablet Take 1 tablet (30 mg total) by mouth daily.   . Melatonin 2.5 MG CAPS Take 1 capsule by mouth at bedtime as needed (for  sleep).    . metoprolol succinate (TOPROL-XL) 25 MG 24 hr tablet Take 1 tablet (25 mg total) by mouth daily.   . Multiple Vitamin (MULTIVITAMIN) tablet Take 1 tablet by mouth daily.   Marland Kitchen PREVIDENT 5000 BOOSTER PLUS 1.1 % PSTE See admin instructions.   . rivaroxaban (XARELTO) 20 MG TABS tablet Take 1 tablet (20 mg total) by mouth daily.   Marland Kitchen rOPINIRole (REQUIP) 2 MG tablet Take 1 tablet (2 mg total) by mouth 3 (three) times daily.   . sildenafil (REVATIO) 20 MG tablet Take 1 tablet (20 mg total) by mouth 3 (three) times daily as needed.   . simvastatin (ZOCOR) 40 MG tablet Take 1 tablet (40 mg total) by mouth daily.   . tamsulosin (FLOMAX) 0.4 MG CAPS capsule TAKE 1 CAPSULE EVERY DAY   . traZODone (DESYREL) 50 MG tablet Take 0.5-1 tablets (25-50 mg total) by mouth at bedtime as needed for sleep. 03/01/2019: 1/2 tab QPM  . trimethobenzamide (TIGAN) 300 MG capsule Take 1 capsule (300 mg total) by mouth 3 (three) times daily. (Patient not taking: Reported on 03/01/2019)    No facility-administered encounter medications on file as of 03/15/2019.      Goals Addressed            This Visit's Progress   . RN- I want to stay independent (pt-stated)       Current Barriers:  . Chronic Disease Management support and education  needs related to Parkinson's Disease and HTN  Nurse Case Manager Clinical Goal(s):  Marland Kitchen Over the next 90 days, patient will work with [facility]  to address needs related to remaining independent   Interventions:  . Discussed plans with patient for ongoing care management follow up and provided patient with direct contact information for care management team . Spouse called to make me aware bed rail arrived and has been extremely helpful for the patient to get up out of bed. She and patient grateful for the assistance with finding and ordering rail.   Patient Self Care Activities:  . Attends all scheduled provider appointments . Performs ADL's independently . Patient wanting to stay  independent as long as possible  Please see past updates related to this goal by clicking on the "Past Updates" button in the selected goal          The care management team will reach out to the patient again over the next 60 days.  The patient has been provided with contact information for the care management team and has been advised to call with any health related questions or concerns.    Merlene Morse Dorn Hartshorne RN, BSN Nurse Case Editor, commissioning Family Practice/[facility] Care Management  [phone]) Business Mobile

## 2019-03-15 NOTE — Progress Notes (Signed)
BP (!) 92/45   Pulse (!) 59   Wt 242 lb (109.8 kg)   BMI 34.72 kg/m    Subjective:    Patient ID: Thomas [name], male    DOB: July 27, 1950, 68 y.o.   MRN: [id number]  HPI: Thomas [name] is a 68 y.o. male  Chief Complaint  Patient presents with  . Hypertension   HYPERTENSION Hypertension status:  labile Satisfied with current treatment? unsure Duration of hypertension: chronic BP monitoring frequency:  a few times a day BP range: 92/45- 130/76 BP medication side effects:  no Medication compliance: excellent compliance Previous BP meds: metoprolol, lisinopril, amlodipine Aspirin: no Recurrent headaches: no Visual changes: no Palpitations: no Dyspnea: no Chest pain: no Lower extremity edema: no Dizzy/lightheaded: yes  Relevant past medical, surgical, family and social history reviewed and updated as indicated. Interim medical history since our last visit reviewed. Allergies and medications reviewed and updated.  Review of Systems  Constitutional: Negative.   Respiratory: Negative.   Cardiovascular: Negative.   Musculoskeletal: Negative.   Neurological: Positive for dizziness and light-headedness. Negative for tremors, seizures, syncope, facial asymmetry, speech difficulty, weakness, numbness and headaches.  Hematological: Negative.   Psychiatric/Behavioral: Negative.     Per HPI unless specifically indicated above     Objective:    BP (!) 92/45   Pulse (!) 59   Wt 242 lb (109.8 kg)   BMI 34.72 kg/m   Wt Readings from Last 3 Encounters:  03/15/19 242 lb (109.8 kg)  01/31/19 246 lb (111.6 kg)  01/27/19 247 lb (112 kg)    Physical Exam Vitals signs and nursing note reviewed.  Constitutional:      General: He is not in acute distress.    Appearance: Normal appearance. He is not ill-appearing, toxic-appearing or diaphoretic.  HENT:     Head: Normocephalic and atraumatic.     Right Ear: External ear normal.     Left Ear: External ear normal.     Nose:  Nose normal.     Mouth/Throat:     Mouth: Mucous membranes are moist.     Pharynx: Oropharynx is clear.  Eyes:     General: No scleral icterus.       Right eye: No discharge.        Left eye: No discharge.     Conjunctiva/sclera: Conjunctivae normal.     Pupils: Pupils are equal, round, and reactive to light.  Neck:     Musculoskeletal: Normal range of motion.  Pulmonary:     Effort: Pulmonary effort is normal. No respiratory distress.     Comments: Speaking in full sentences Musculoskeletal: Normal range of motion.  Skin:    Coloration: Skin is not jaundiced or pale.     Findings: No bruising, erythema, lesion or rash.  Neurological:     Mental Status: He is alert and oriented to person, place, and time. Mental status is at baseline.  Psychiatric:        Mood and Affect: Mood normal.        Behavior: Behavior normal.        Thought Content: Thought content normal.        Judgment: Judgment normal.     Results for orders placed or performed in visit on [id number]  Basic metabolic panel  Result Value Ref Range   Glucose 100 (H) 65 - 99 mg/dL   BUN 18 8 - 27 mg/dL   Creatinine, Ser 1.14 0.76 - 1.27 mg/dL   GFR calc non  Af Amer 65 >59 mL/min/1.73   GFR calc Af Amer 75 >59 mL/min/1.73   BUN/Creatinine Ratio 16 10 - 24   Sodium 141 134 - 144 mmol/L   Potassium 4.5 3.5 - 5.2 mmol/L   Chloride 107 (H) 96 - 106 mmol/L   CO2 24 20 - 29 mmol/L   Calcium 9.0 8.6 - 10.2 mg/dL      Assessment & Plan:   Problem List Items Addressed This Visit      Cardiovascular and Mediastinum   Hypertension - Primary    Over treated. Will cut amlodipine in 1/2 and recheck 2 weeks. Call with any concerns.       Relevant Medications   amLODipine (NORVASC) 2.5 MG tablet   lisinopril (ZESTRIL) 10 MG tablet       Follow up plan: Return in about 2 weeks (around 03/29/2019).    . This visit was completed via FaceTime due to the restrictions of the COVID-19 pandemic. All issues as above  were discussed and addressed. Physical exam was done as above through visual confirmation on FaceTime. If it was felt that the patient should be evaluated in the office, they were directed there. The patient verbally consented to this visit. . Location of the patient: home . Location of the provider: work . Those involved with this call:  . Provider: Park Liter, DO . CMA: Yvonna Alanis, [place] . Front Desk/Registration: Don Perking  . Time spent on call: 15 minutes with patient face to face via video conference. More than 50% of this time was spent in counseling and coordination of care. 23 minutes total spent in review of patient's record and preparation of their chart.

## 2019-03-15 NOTE — Assessment & Plan Note (Signed)
Over treated. Will cut amlodipine in 1/2 and recheck 2 weeks. Call with any concerns.

## 2019-03-15 NOTE — Patient Instructions (Signed)
Thank you allowing the Chronic Care Management Team to be a part of your care! It was a pleasure speaking with you today!  CCM (Chronic Care Management) Team   Kirk Sampley RN, BSN Nurse Care Coordinator  [phone]  Catie [facility] PharmD  Clinical Pharmacist  [phone]  Eula Fried LCSW Clinical Social Worker [phone]  Goals Addressed            This Visit's Progress   . RN- I want to stay independent (pt-stated)       Current Barriers:  . Chronic Disease Management support and education needs related to Parkinson's Disease and HTN  Nurse Case Manager Clinical Goal(s):  Marland Kitchen Over the next 90 days, patient will work with [facility]  to address needs related to remaining independent   Interventions:  . Discussed plans with patient for ongoing care management follow up and provided patient with direct contact information for care management team . Spouse called to make me aware bed rail arrived and has been extremely helpful for the patient to get up out of bed. She and patient grateful for the assistance with finding and ordering rail.   Patient Self Care Activities:  . Attends all scheduled provider appointments . Performs ADL's independently . Patient wanting to stay independent as long as possible  Please see past updates related to this goal by clicking on the "Past Updates" button in the selected goal         The patient verbalized understanding of instructions provided today and declined a print copy of patient instruction materials.   The patient has been provided with contact information for the care management team and has been advised to call with any health related questions or concerns.

## 2019-03-16 ENCOUNTER — Telehealth: Payer: Self-pay

## 2019-03-21 ENCOUNTER — Other Ambulatory Visit: Payer: Self-pay | Admitting: Family Medicine

## 2019-03-22 ENCOUNTER — Telehealth: Payer: Self-pay | Admitting: Family Medicine

## 2019-03-22 NOTE — Telephone Encounter (Addendum)
Pt's spouse called in to make provider aware that pt's pharmacy [facility]) never received Rx for lisinopril (ZESTRIL) 10 MG tablet. Pt is now completely out, pt would like to know if provider would send in a supply to the local pharmacy?    Also,   [facility] [place], [place] - [address] [phone] (Phone) [phone] (Fax)   Pt would also like to have the rest of his supply sent to mail order.   Please assist

## 2019-03-23 MED ORDER — LISINOPRIL 10 MG PO TABS: 10.0000 mg | ORAL_TABLET | Freq: Every day | ORAL | 3 refills | Status: DC

## 2019-03-23 NOTE — Telephone Encounter (Signed)
Patient's wife said that he has 270 30 mg lisinopril left

## 2019-03-23 NOTE — Telephone Encounter (Signed)
I did not send the 10mg  to [facility], because the goal is to get him on the 40mg  pill and the 10mg  was just until he ran out of the 30s- how many of the 30s does he have left?

## 2019-03-23 NOTE — Telephone Encounter (Signed)
Do I still need to call patient since a 4 month supply was sent in?

## 2019-03-23 NOTE — Telephone Encounter (Signed)
Can you please resend to [facility] and send a month Supply to [facility]

## 2019-03-24 ENCOUNTER — Other Ambulatory Visit: Payer: Self-pay

## 2019-03-24 DIAGNOSIS — Z20822 Contact with and (suspected) exposure to covid-19: Secondary | ICD-10-CM

## 2019-03-26 LAB — NOVEL CORONAVIRUS, NAA: SARS-CoV-2, NAA: NOT DETECTED

## 2019-03-28 ENCOUNTER — Telehealth: Payer: Self-pay | Admitting: *Deleted

## 2019-03-28 NOTE — Telephone Encounter (Signed)
Patient called given negative covid results . 

## 2019-03-30 ENCOUNTER — Ambulatory Visit (INDEPENDENT_AMBULATORY_CARE_PROVIDER_SITE_OTHER): Payer: Medicare HMO | Admitting: Family Medicine

## 2019-03-30 ENCOUNTER — Encounter: Payer: Self-pay | Admitting: Family Medicine

## 2019-03-30 ENCOUNTER — Other Ambulatory Visit: Payer: Self-pay

## 2019-03-30 VITALS — BP 99/65 | HR 57 | Temp 95.3°F

## 2019-03-30 DIAGNOSIS — I10 Essential (primary) hypertension: Secondary | ICD-10-CM | POA: Diagnosis not present

## 2019-03-30 NOTE — Progress Notes (Signed)
BP 99/65   Pulse (!) 57   Temp (!) 95.3 F (35.2 C)    Subjective:    Patient ID: Thomas [name], male    DOB: [birth year], 68 y.o.   MRN: [id number]  HPI: Thomas [name] is a 68 y.o. male  Chief Complaint  Patient presents with  . Hypertension   HYPERTENSION Hypertension status: stable  Satisfied with current treatment? yes Duration of hypertension: chronic BP monitoring frequency:  a few times a week BP range: 99/56, 126/75, 101/60, 95/60, 104/62, 133/79,  BP medication side effects:  no Medication compliance: excellent compliance Previous BP meds:lisinopril, metoprolol, amlodipine Aspirin: no Recurrent headaches: no Visual changes: no Palpitations: no Dyspnea: no Chest pain: no Lower extremity edema: no Dizzy/lightheaded: no  Relevant past medical, surgical, family and social history reviewed and updated as indicated. Interim medical history since our last visit reviewed. Allergies and medications reviewed and updated.  Review of Systems  Constitutional: Negative.   Respiratory: Negative.   Cardiovascular: Negative.   Musculoskeletal: Negative.   Skin: Negative.   Neurological: Negative.   Psychiatric/Behavioral: Negative.     Per HPI unless specifically indicated above     Objective:    BP 99/65   Pulse (!) 57   Temp (!) 95.3 F (35.2 C)   Wt Readings from Last 3 Encounters:  03/15/19 242 lb (109.8 kg)  01/31/19 246 lb (111.6 kg)  01/27/19 247 lb (112 kg)    Physical Exam Vitals and nursing note reviewed.  Pulmonary:     Effort: Pulmonary effort is normal. No respiratory distress.     Comments: Speaking in full sentences Neurological:     Mental Status: He is alert.  Psychiatric:        Mood and Affect: Mood normal.        Behavior: Behavior normal.        Thought Content: Thought content normal.        Judgment: Judgment normal.     Results for orders placed or performed in visit on 03/24/19  Novel Coronavirus, NAA ([facility])   Specimen:  Nasopharyngeal(NP) swabs in vial transport medium   NASOPHARYNGE  TESTING  Result Value Ref Range   SARS-CoV-2, NAA Not Detected Not Detected      Assessment & Plan:   Problem List Items Addressed This Visit      Cardiovascular and Mediastinum   Hypertension - Primary    Not symptomatic in the 90s/60s. Will continue current regimen. Continue to monitor. Will check blood work at follow up. Call with any concerns.           Follow up plan: Return in about 3 months (around 06/28/2019) for follow up.    . This visit was completed via FaceTime due to the restrictions of the COVID-19 pandemic. All issues as above were discussed and addressed. Physical exam was done as above through visual confirmation on FaceTime. If it was felt that the patient should be evaluated in the office, they were directed there. The patient verbally consented to this visit. . Location of the patient: home . Location of the provider: home . Those involved with this call:  . Provider: Park Liter, DO . CMA: Tiffany Reel, CMA . Front Desk/Registration: Don Perking  . Time spent on call: 15 minutes with patient face to face via video conference. More than 50% of this time was spent in counseling and coordination of care. 23 minutes total spent in review of patient's record and preparation of their chart.

## 2019-03-30 NOTE — Assessment & Plan Note (Signed)
Not symptomatic in the 90s/60s. Will continue current regimen. Continue to monitor. Will check blood work at follow up. Call with any concerns.

## 2019-04-01 ENCOUNTER — Other Ambulatory Visit: Payer: Self-pay | Admitting: Neurology

## 2019-04-01 ENCOUNTER — Other Ambulatory Visit: Payer: Self-pay | Admitting: Family Medicine

## 2019-04-01 DIAGNOSIS — G20 Parkinson's disease: Secondary | ICD-10-CM

## 2019-04-22 ENCOUNTER — Other Ambulatory Visit: Payer: Self-pay | Admitting: Neurology

## 2019-04-29 ENCOUNTER — Other Ambulatory Visit: Payer: Self-pay | Admitting: Family Medicine

## 2019-04-29 ENCOUNTER — Ambulatory Visit: Payer: Self-pay

## 2019-04-29 NOTE — Telephone Encounter (Signed)
Incoming call from Patient and wife reporting that lately Patient has been having low blood pressures lately.  Quotes 83/50 91/53115/64.  Patient and wife request an adjustment on on his medication.  States that it is usually low during the mornings.  Occasionally becomes dizzy.  Patient and wife. Request a return phone call on Monday.  Encouraged Patient to contact Urgent care if sx worsen over the weekend.   Patient voiced understanding.            Reason for Disposition . [1] MILD dizziness (e.g., vertigo; walking normally) AND [2] has NOT been evaluated by physician for this  Answer Assessment - Initial Assessment Questions 1. DESCRIPTION: "Describe your dizziness."     lightheaded 2. VERTIGO: "Do you feel like either you or the room is spinning or tilting?"       3. LIGHTHEADED: "Do you feel lightheaded?" (e.g., somewhat faint, woozy, weak upon standing)     4. SEVERITY: "How bad is it?"  "Can you walk?"   - MILD - Feels unsteady but walking normally.   - MODERATE - Feels very unsteady when walking, but not falling; interferes with normal activities (e.g., school, work) .   - SEVERE - Unable to walk without falling (requires assistance).    moderate 5. ONSET:  "When did the dizziness begin?"     months 6. AGGRAVATING FACTORS: "Does anything make it worse?" (e.g., standing, change in head position)    dont think so 7. CAUSE: "What do you think is causing the dizziness?"      Drinks 3 glasses of  8. RECURRENT SYMPTOM: "Have you had dizziness before?" If so, ask: "When was the last time?" "What happened that time?"    yes 9. OTHER SYMPTOMS: "Do you have any other symptoms?" (e.g., headache, weakness, numbness, vomiting, earache)     denies 10. PREGNANCY: "Is there any chance you are pregnant?" "When was your last menstrual period?"      Na  Protocols used: DIZZINESS - VERTIGO-A-AH

## 2019-05-02 NOTE — Telephone Encounter (Signed)
Can I get a phone call with him today?

## 2019-05-02 NOTE — Telephone Encounter (Signed)
Called pt to schedule, no answer, left vm. If they call back will schedule.

## 2019-05-03 ENCOUNTER — Encounter: Payer: Self-pay | Admitting: Family Medicine

## 2019-05-03 ENCOUNTER — Ambulatory Visit: Payer: Medicare HMO | Admitting: Family Medicine

## 2019-05-03 ENCOUNTER — Ambulatory Visit (INDEPENDENT_AMBULATORY_CARE_PROVIDER_SITE_OTHER): Payer: Medicare HMO | Admitting: Family Medicine

## 2019-05-03 DIAGNOSIS — I10 Essential (primary) hypertension: Secondary | ICD-10-CM

## 2019-05-03 NOTE — Progress Notes (Signed)
BP 109/66   Pulse (!) 59    Subjective:    Patient ID: Thomas [name], male    DOB: 20-Apr-1950, 69 y.o.   MRN: [id number]  HPI: [name] is a 69 y.o. male  Chief Complaint  Patient presents with  . Hypertension    pt states his BP has been running up and down    BP has been very labile- was down in the 80s/60s and then went up to above 200s when he didn't take the medicine. Lows seem to be in the AM. Taking his amlodipine in the afternoon. He is frustrated about this as he gets very dizzy when he gets low, but then when he doesn't take the amlodipine he goes very high.  HYPERTENSION Hypertension status: very labile  Satisfied with current treatment? no Duration of hypertension: chronic BP monitoring frequency:  a few times a day BP range: 80s/50s-2002/100s BP medication side effects:  no Medication compliance: excellent compliance Aspirin: no Recurrent headaches: no Visual changes: no Palpitations: no Dyspnea: no Chest pain: no Lower extremity edema: no Dizzy/lightheaded: yes  Relevant past medical, surgical, family and social history reviewed and updated as indicated. Interim medical history since our last visit reviewed. Allergies and medications reviewed and updated.  Review of Systems  Constitutional: Negative.   Respiratory: Negative.   Cardiovascular: Negative.   Skin: Negative.   Neurological: Negative.   Psychiatric/Behavioral: Negative.     Per HPI unless specifically indicated above     Objective:    BP 109/66   Pulse (!) 59   Wt Readings from Last 3 Encounters:  03/15/19 242 lb (109.8 kg)  01/31/19 246 lb (111.6 kg)  01/27/19 247 lb (112 kg)    Physical Exam Vitals and nursing note reviewed.  Constitutional:      General: He is not in acute distress.    Appearance: Normal appearance. He is not ill-appearing, toxic-appearing or diaphoretic.  HENT:     Head: Normocephalic and atraumatic.     Right Ear: External ear normal.     Left Ear:  External ear normal.     Nose: Nose normal.     Mouth/Throat:     Mouth: Mucous membranes are moist.     Pharynx: Oropharynx is clear.  Eyes:     General: No scleral icterus.       Right eye: No discharge.        Left eye: No discharge.     Conjunctiva/sclera: Conjunctivae normal.     Pupils: Pupils are equal, round, and reactive to light.  Pulmonary:     Effort: Pulmonary effort is normal. No respiratory distress.     Comments: Speaking in full sentences Musculoskeletal:        General: Normal range of motion.     Cervical back: Normal range of motion.  Skin:    Coloration: Skin is not jaundiced or pale.     Findings: No bruising, erythema, lesion or rash.  Neurological:     Mental Status: He is alert and oriented to person, place, and time. Mental status is at baseline.  Psychiatric:        Mood and Affect: Mood normal.        Behavior: Behavior normal.        Thought Content: Thought content normal.        Judgment: Judgment normal.     Results for orders placed or performed in visit on 03/24/19  Novel Coronavirus, NAA ([facility])   Specimen: Nasopharyngeal(NP)  swabs in vial transport medium   NASOPHARYNGE  TESTING  Result Value Ref Range   SARS-CoV-2, NAA Not Detected Not Detected      Assessment & Plan:   Problem List Items Addressed This Visit      Cardiovascular and Mediastinum   Hypertension    Very labile, likely due to the dysautonomia from his Parkinson's. We will cut his lisinopril down to 30mg  to avoid the low lows and discussed how to manage them. Continue amlodipine to help avoid the highs. Recheck 2 weeks.           Follow up plan: Return in about 2 weeks (around 05/17/2019).   . This visit was completed via FaceTime due to the restrictions of the COVID-19 pandemic. All issues as above were discussed and addressed. Physical exam was done as above through visual confirmation on FaceTime. If it was felt that the patient should be evaluated in the  office, they were directed there. The patient verbally consented to this visit. . Location of the patient: home . Location of the provider: work . Those involved with this call:  . Provider: Park Liter, DO . CMA: Yvonna Alanis, [place] . Front Desk/Registration: Don Perking  . Time spent on call: 15 minutes with patient face to face via video conference. More than 50% of this time was spent in counseling and coordination of care. 23 minutes total spent in review of patient's record and preparation of their chart.

## 2019-05-03 NOTE — Assessment & Plan Note (Signed)
Very labile, likely due to the dysautonomia from his Parkinson's. We will cut his lisinopril down to 30mg  to avoid the low lows and discussed how to manage them. Continue amlodipine to help avoid the highs. Recheck 2 weeks.

## 2019-05-03 NOTE — Patient Instructions (Signed)
We are recommending the vaccine to everyone who has not had an allergic reaction to any of the components of the vaccine. If you have specific questions about the vaccine, please bring them up with your health care provider to discuss them.   We will likely not be getting the vaccine in the office for the first rounds of vaccinations. The way they are releasing the vaccines is going to be through the health systems (like [facility], [facility], [facility], [facility]) or through your county health department.   The [facility] is giving vaccines to those 75+ starting 04/20/19  M-F 7AM to 4PM [facility] [address], [place], [place] [facility] in a drive through tent  If you are 65+ you can get a vaccine through [facility] by signing up for an appointment.  You can sign up by going to: [email or web address].  You can get more information by going to: [email or web address] 

## 2019-05-10 ENCOUNTER — Telehealth: Payer: Self-pay | Admitting: General Practice

## 2019-05-10 ENCOUNTER — Encounter: Payer: Self-pay | Admitting: General Practice

## 2019-05-10 ENCOUNTER — Ambulatory Visit (INDEPENDENT_AMBULATORY_CARE_PROVIDER_SITE_OTHER): Payer: Medicare HMO | Admitting: General Practice

## 2019-05-10 DIAGNOSIS — I10 Essential (primary) hypertension: Secondary | ICD-10-CM | POA: Diagnosis not present

## 2019-05-10 DIAGNOSIS — E7800 Pure hypercholesterolemia, unspecified: Secondary | ICD-10-CM | POA: Diagnosis not present

## 2019-05-10 DIAGNOSIS — G20 Parkinson's disease: Secondary | ICD-10-CM

## 2019-05-10 NOTE — Chronic Care Management (AMB) (Signed)
Chronic Care Management   Follow Up Note   05/10/2019 Name: Thomas [name] MRN: [id number] DOB: 11-02-1950  Referred by: [name], DO Reason for referral : Chronic Care Management (Follow up: HTN/HLD/Parkinson's DX (rail and safety))   Thomas [name] is a 69 y.o. year old male who is a primary care patient of [name], DO. The CCM team was consulted for assistance with chronic disease management and care coordination needs.    Review of patient status, including review of consultants reports, relevant laboratory and other test results, and collaboration with appropriate care team members and the patient's provider was performed as part of comprehensive patient evaluation and provision of chronic care management services.    SDOH (Social Determinants of Health) screening performed today: Physical Activity. See Care Plan for related entries.   Outpatient Encounter Medications as of 05/10/2019  Medication Sig Note   amLODipine (NORVASC) 2.5 MG tablet Take 0.5 tablets (1.25 mg total) by mouth daily.    acetaminophen (TYLENOL) 325 MG tablet Take 325 mg by mouth every 6 (six) hours as needed.    amantadine (SYMMETREL) 100 MG capsule TAKE 1 CAPSULE THREE TIMES DAILY    carbidopa-levodopa (SINEMET CR) 50-200 MG tablet Take 1 tablet by mouth at bedtime.    carbidopa-levodopa (SINEMET IR) 25-100 MG tablet TAKE 2 TABLETS AT 6AM, 10AM, AND 2PM, AND TAKE 1 TABLET AT 6PM AS DIRECTED.    cetirizine (ZYRTEC) 10 MG tablet Take 10 mg by mouth daily.    diclofenac Sodium (VOLTAREN) 1 % GEL APPLY TOPICALLY 4 GRAMS FOUR TIMES DAILY    lisinopril (ZESTRIL) 30 MG tablet Take 1 tablet (30 mg total) by mouth daily.    Melatonin 2.5 MG CAPS Take 1 capsule by mouth at bedtime as needed (for sleep).     metoprolol succinate (TOPROL-XL) 25 MG 24 hr tablet TAKE 1 TABLET EVERY DAY    Multiple Vitamin (MULTIVITAMIN) tablet Take 1 tablet by mouth daily.    PREVIDENT 5000 BOOSTER PLUS 1.1 % PSTE  See admin instructions.    rivaroxaban (XARELTO) 20 MG TABS tablet Take 1 tablet (20 mg total) by mouth daily.    rOPINIRole (REQUIP) 2 MG tablet TAKE 1 TABLET THREE TIMES DAILY    sildenafil (REVATIO) 20 MG tablet Take 1 tablet (20 mg total) by mouth 3 (three) times daily as needed.    simvastatin (ZOCOR) 40 MG tablet TAKE 1 TABLET EVERY DAY    tamsulosin (FLOMAX) 0.4 MG CAPS capsule TAKE 1 CAPSULE EVERY DAY    traZODone (DESYREL) 50 MG tablet Take 0.5-1 tablets (25-50 mg total) by mouth at bedtime as needed for sleep. 03/01/2019: 1/2 tab QPM   trimethobenzamide (TIGAN) 300 MG capsule Take 1 capsule (300 mg total) by mouth 3 (three) times daily. (Patient not taking: Reported on 03/01/2019)    No facility-administered encounter medications on file as of 05/10/2019.     Objective:   Goals Addressed            This Visit's Progress    RN- I want to stay independent (pt-stated)       Current Barriers:   Chronic Disease Management support and education needs related to Parkinson's Disease, HTN, and HLD  Nurse Case Manager Clinical Goal(s):   Over the next 90 days, patient will work with [facility]  to address needs related to remaining independent   Interventions:   Discussed plans with patient for ongoing care management follow up and provided patient with direct contact information for  care management team  Evaluated the use of the bed rail.  The patient states this is very helpful for him and he is glad he has this to use  Assessed new safety concerns with the patient.  The patient uses a "hemi-walker" in the am for about 30 minutes before attempting to ambulate without assistance.  The patient feels safe in his home environment.  The patient verbalized he has had not falls.  Patient Self Care Activities:   Attends all scheduled provider appointments  Performs ADL's independently  Patient wanting to stay independent as long as possible  Please see past updates related to  this goal by clicking on the "Past Updates" button in the selected goal       RNCM: I am taking my blood pressure 2 times a day.  I have a new pill I am taking for my blood pressure       Current Barriers:   Chronic Disease Management support and education needs related to HTN and HLD   Nurse Case Manager Clinical Goal(s):   Over the next 90 days, patient will verbalize understanding of plan for HTN and HLD  Over the next 90 days, patient will attend all scheduled medical appointments: next appointment pcp on 06-28-2019  Over the next 90 days, patient will demonstrate improved adherence to prescribed treatment plan for HTN and HLD as evidenced by continuing to monitor blood pressure BID, and adherence to a low sodium, heart healthy diet  Over the next 90 days, patient will demonstrate improved health management independence as evidenced by continuing to independently maintain health and well being with the assistance of his spouse when needed  Over the next 90 days, patient will work with CM team pharmacist to effectively manage medications and monitor for any issues such as orthostatic hypotension related to the patient taking amlodipine 2.5mg    Interventions:   Evaluation of current treatment plan related to HTN and HLD and patient's adherence to plan as established by provider.  Provided education to patient re: orthostatic hypotension, changing position slowly and safety concerns in the patient with multiple chronic disease processes  Discussed plans with patient for ongoing care management follow up and provided patient with direct contact information for care management team  Advised patient, providing education and rationale, to monitor blood pressure daily and record, calling pcp for findings outside established parameters.   Reviewed scheduled/upcoming provider appointments including: CCM team and pcp scheduled for 06-28-2019  Pharmacy referral for medication management and  follow up for any new concerns related to addition of amlodipine to the current treatment regimen.   Patient Self Care Activities:   Patient verbalizes understanding of plan to manage HTN and HLD  Attends all scheduled provider appointments  Calls provider office for new concerns or questions  Unable to independently manage chronic conditions of HTN and HLD  Initial goal documentation         Plan:   The care management team will reach out to the patient again over the next 60 days.    Noreene Larsson RN, MSN, [facility] Mobile: [phone]

## 2019-05-10 NOTE — Patient Instructions (Signed)
Visit Information  Goals Addressed            This Visit's Progress   . RN- I want to stay independent (pt-stated)       Current Barriers:  . Chronic Disease Management support and education needs related to Parkinson's Disease, HTN, and HLD  Nurse Case Manager Clinical Goal(s):  Marland Kitchen Over the next 90 days, patient will work with [facility]  to address needs related to remaining independent   Interventions:  . Discussed plans with patient for ongoing care management follow up and provided patient with direct contact information for care management team . Evaluated the use of the bed rail.  The patient states this is very helpful for him and he is glad he has this to use . Assessed new safety concerns with the patient.  The patient uses a "hemi-walker" in the am for about 30 minutes before attempting to ambulate without assistance.  The patient feels safe in his home environment.  The patient verbalized he has had not falls.  Patient Self Care Activities:  . Attends all scheduled provider appointments . Performs ADL's independently . Patient wanting to stay independent as long as possible  Please see past updates related to this goal by clicking on the "Past Updates" button in the selected goal      . RNCM: I am taking my blood pressure 2 times a day.  I have a new pill I am taking for my blood pressure       Current Barriers:  . Chronic Disease Management support and education needs related to HTN and HLD   Nurse Case Manager Clinical Goal(s):  Marland Kitchen Over the next 90 days, patient will verbalize understanding of plan for HTN and HLD . Over the next 90 days, patient will attend all scheduled medical appointments: next appointment pcp on 06-28-2019 . Over the next 90 days, patient will demonstrate improved adherence to prescribed treatment plan for HTN and HLD as evidenced by continuing to monitor blood pressure BID, and adherence to a low sodium, heart healthy diet . Over the next 90 days,  patient will demonstrate improved health management independence as evidenced by continuing to independently maintain health and well being with the assistance of his spouse when needed . Over the next 90 days, patient will work with CM team pharmacist to effectively manage medications and monitor for any issues such as orthostatic hypotension related to the patient taking amlodipine 2.5mg    Interventions:  . Evaluation of current treatment plan related to HTN and HLD and patient's adherence to plan as established by provider. . Provided education to patient re: orthostatic hypotension, changing position slowly and safety concerns in the patient with multiple chronic disease processes . Discussed plans with patient for ongoing care management follow up and provided patient with direct contact information for care management team . Advised patient, providing education and rationale, to monitor blood pressure daily and record, calling pcp for findings outside established parameters.  . Reviewed scheduled/upcoming provider appointments including: CCM team and pcp scheduled for 06-28-2019 . Pharmacy referral for medication management and follow up for any new concerns related to addition of amlodipine to the current treatment regimen.   Patient Self Care Activities:  . Patient verbalizes understanding of plan to manage HTN and HLD . Attends all scheduled provider appointments . Calls provider office for new concerns or questions . Unable to independently manage chronic conditions of HTN and HLD  Initial goal documentation  The patient verbalized understanding of instructions provided today and declined a print copy of patient instruction materials.   The care management team will reach out to the patient again over the next 60 days.   Noreene Larsson RN, MSN, [facility] Mobile: [phone]

## 2019-05-11 ENCOUNTER — Other Ambulatory Visit: Payer: Self-pay | Admitting: Family Medicine

## 2019-05-12 ENCOUNTER — Telehealth: Payer: Self-pay

## 2019-05-12 NOTE — Telephone Encounter (Signed)
Pls cancel at pharmacy

## 2019-05-12 NOTE — Telephone Encounter (Signed)
Please see message below, should I just call and cancel this at the pharmacy? Copied from [place] [phone]. Topic: General - Other >> May 11, 2019  2:58 PM Yvette Rack wrote: Reason for CRM: Pt spouse called and stated they received a call from [facility] regarding a refill of lisinopril (ZESTRIL) 10 MG tablet. Pt spouse asked that this request be denied because pt takes lisinopril (ZESTRIL) 30 MG tablet

## 2019-05-12 NOTE — Telephone Encounter (Signed)
Medication cancelled 

## 2019-05-20 ENCOUNTER — Telehealth (INDEPENDENT_AMBULATORY_CARE_PROVIDER_SITE_OTHER): Payer: Medicare HMO | Admitting: Family Medicine

## 2019-05-20 ENCOUNTER — Encounter: Payer: Self-pay | Admitting: Family Medicine

## 2019-05-20 VITALS — BP 118/72 | HR 62 | Wt 241.0 lb

## 2019-05-20 DIAGNOSIS — I10 Essential (primary) hypertension: Secondary | ICD-10-CM | POA: Diagnosis not present

## 2019-05-20 MED ORDER — AMLODIPINE BESYLATE 2.5 MG PO TABS: 1.2500 mg | ORAL_TABLET | Freq: Every day | ORAL | 1 refills | Status: DC

## 2019-05-20 NOTE — Progress Notes (Signed)
BP 118/72   Pulse 62   Wt 241 lb (109.3 kg)   BMI 34.58 kg/m    Subjective:    Patient ID: Thomas [name], male    DOB: [birth year], 69 y.o.   MRN: [id number]  HPI: Thomas [name] is a 69 y.o. male  Chief Complaint  Patient presents with  . Blood Pressure Check   HYPERTENSION- Has been running about 118/7, jumping up into the 160s in the PM. Feeling fine when that happens. Unsure how long it's staying up there. He's been feeling well. No dizziness, no headaches.  Hypertension status: better  Satisfied with current treatment? yes Duration of hypertension: chronic BP monitoring frequency:  a few times a day BP medication side effects:  no Medication compliance: excellent compliance Previous BP meds: lisinopril, amlodipine Aspirin: yes Recurrent headaches: no Visual changes: no Palpitations: no Dyspnea: no Chest pain: no Lower extremity edema: no Dizzy/lightheaded: no   Relevant past medical, surgical, family and social history reviewed and updated as indicated. Interim medical history since our last visit reviewed. Allergies and medications reviewed and updated.  Review of Systems  Constitutional: Negative.   Respiratory: Negative.   Cardiovascular: Negative.   Musculoskeletal: Negative.   Psychiatric/Behavioral: Negative.     Per HPI unless specifically indicated above     Objective:    BP 118/72   Pulse 62   Wt 241 lb (109.3 kg)   BMI 34.58 kg/m   Wt Readings from Last 3 Encounters:  05/20/19 241 lb (109.3 kg)  03/15/19 242 lb (109.8 kg)  01/31/19 246 lb (111.6 kg)    Physical Exam Vitals and nursing note reviewed.  Constitutional:      General: He is not in acute distress.    Appearance: Normal appearance. He is not ill-appearing, toxic-appearing or diaphoretic.  HENT:     Head: Normocephalic and atraumatic.     Right Ear: External ear normal.     Left Ear: External ear normal.     Nose: Nose normal.     Mouth/Throat:     Mouth: Mucous membranes  are moist.     Pharynx: Oropharynx is clear.  Eyes:     General: No scleral icterus.       Right eye: No discharge.        Left eye: No discharge.     Conjunctiva/sclera: Conjunctivae normal.     Pupils: Pupils are equal, round, and reactive to light.  Pulmonary:     Effort: Pulmonary effort is normal. No respiratory distress.     Comments: Speaking in full sentences Musculoskeletal:        General: Normal range of motion.     Cervical back: Normal range of motion.  Skin:    Coloration: Skin is not jaundiced or pale.     Findings: No bruising, erythema, lesion or rash.  Neurological:     Mental Status: He is alert and oriented to person, place, and time. Mental status is at baseline.  Psychiatric:        Mood and Affect: Mood normal.        Behavior: Behavior normal.        Thought Content: Thought content normal.        Judgment: Judgment normal.     Results for orders placed or performed in visit on 03/24/19  Novel Coronavirus, NAA ([facility])   Specimen: Nasopharyngeal(NP) swabs in vial transport medium   NASOPHARYNGE  TESTING  Result Value Ref Range   SARS-CoV-2, NAA Not Detected  Not Detected      Assessment & Plan:   Problem List Items Addressed This Visit      Cardiovascular and Mediastinum   Hypertension - Primary    Continues to be labile, but in general better than it had been. Will continue current regimen. Continue to monitor. Call with any concerns. Will get him in for BMP next week.       Relevant Medications   amLODipine (NORVASC) 2.5 MG tablet   Other Relevant Orders   Basic metabolic panel       Follow up plan: Return As scheduled.   . This visit was completed via FaceTime due to the restrictions of the COVID-19 pandemic. All issues as above were discussed and addressed. Physical exam was done as above through visual confirmation on FaceTime. If it was felt that the patient should be evaluated in the office, they were directed there. The patient  verbally consented to this visit. . Location of the patient: home . Location of the provider: work . Those involved with this call:  . Provider: Park Liter, DO . CMA: Tiffany Reel, CMA . Front Desk/Registration: Don Perking  . Time spent on call: 15 minutes with patient face to face via video conference. More than 50% of this time was spent in counseling and coordination of care. 30 minutes total spent in review of patient's record and preparation of their chart.

## 2019-05-20 NOTE — Assessment & Plan Note (Signed)
Continues to be labile, but in general better than it had been. Will continue current regimen. Continue to monitor. Call with any concerns. Will get him in for BMP next week.

## 2019-05-23 ENCOUNTER — Other Ambulatory Visit: Payer: Medicare HMO

## 2019-05-24 ENCOUNTER — Other Ambulatory Visit: Payer: Medicare HMO

## 2019-05-24 ENCOUNTER — Other Ambulatory Visit: Payer: Self-pay

## 2019-05-24 DIAGNOSIS — I10 Essential (primary) hypertension: Secondary | ICD-10-CM | POA: Diagnosis not present

## 2019-05-25 ENCOUNTER — Encounter: Payer: Self-pay | Admitting: Family Medicine

## 2019-05-25 LAB — BASIC METABOLIC PANEL
BUN/Creatinine Ratio: 16 (ref 10–24)
BUN: 17 mg/dL (ref 8–27)
CO2: 21 mmol/L (ref 20–29)
Calcium: 9 mg/dL (ref 8.6–10.2)
Chloride: 107 mmol/L — ABNORMAL HIGH (ref 96–106)
Creatinine, Ser: 1.07 mg/dL (ref 0.76–1.27)
GFR calc Af Amer: 81 mL/min/{1.73_m2} (ref >59)
GFR calc non Af Amer: 70 mL/min/{1.73_m2} (ref >59)
Glucose: 93 mg/dL (ref 65–99)
Potassium: 4.6 mmol/L (ref 3.5–5.2)
Sodium: 142 mmol/L (ref 134–144)

## 2019-06-06 ENCOUNTER — Ambulatory Visit (INDEPENDENT_AMBULATORY_CARE_PROVIDER_SITE_OTHER): Payer: Medicare HMO

## 2019-06-06 ENCOUNTER — Other Ambulatory Visit: Payer: Self-pay

## 2019-06-06 VITALS — BP 124/83 | HR 56 | Temp 97.9°F | Ht 70.0 in | Wt 249.0 lb

## 2019-06-06 DIAGNOSIS — Z1211 Encounter for screening for malignant neoplasm of colon: Secondary | ICD-10-CM

## 2019-06-06 DIAGNOSIS — Z0000 Encounter for general adult medical examination without abnormal findings: Secondary | ICD-10-CM

## 2019-06-06 NOTE — Addendum Note (Signed)
Addended by: Tyler Aas A on: 06/06/2019 02:53 PM   Modules accepted: Orders

## 2019-06-06 NOTE — Progress Notes (Signed)
Subjective:   Thomas [name] is a 69 y.o. male who presents for Medicare Annual/Subsequent preventive examination.  Review of Systems:   Cardiac Risk Factors include: advanced age (>83men, >83 women);male gender;dyslipidemia;hypertension;obesity (BMI >30kg/m2)     Objective:    Vitals: BP 124/83 (BP Location: Left Arm, Patient Position: Sitting)   Pulse (!) 56   Temp 97.9 F (36.6 C) (Oral)   Ht 5\' 10"  (1.778 m)   Wt 249 lb (112.9 kg)   BMI 35.73 kg/m   Body mass index is 35.73 kg/m.  Advanced Directives 01/27/2019 09/08/2018 09/01/2018 08/28/2018 06/03/2018 05/01/2017 04/27/2017  Does Patient Have a Medical Advance Directive? Yes Yes Yes Yes Yes Yes No  Type of Advance Directive Living will;Healthcare Power of [place];Living will [place];Living will Living will [place];Living will [place];Living will -  Does patient want to make changes to medical advance directive? - - - No - Patient declined - - -  Copy of [place] in Chart? - - Yes - validated most recent copy scanned in chart (See row information) - Yes - validated most recent copy scanned in chart (See row information) No - copy requested -  Would patient like information on creating a medical advance directive? - - - No - Patient declined - - -    Tobacco Social History   Tobacco Use  Smoking Status Never Smoker  Smokeless Tobacco Never Used     Counseling given: Not Answered   Clinical Intake:  Pre-visit preparation completed: Yes  Pain : No/denies pain     Nutritional Status: BMI > 30  Obese Nutritional Risks: None Diabetes: No  How often do you need to have someone help you when you read instructions, pamphlets, or other written materials from your doctor or pharmacy?: 1 - Never  Interpreter Needed?: No  Information entered by :: Itati Brocksmith,LPN  Past Medical History:  Diagnosis Date  .  Allergy   . Arthritis    Osteoarthritis  . Arthritis of knee   . Atrial fibrillation ([place])   . DVT (deep venous thrombosis) ([facility])    L leg  . Hyperchloremia   . Hypercholesterolemia   . Hypertension   . Nephrolithiasis   . Parkinson's disease ([place])   . Plantar wart   . Pulmonary embolism ([place])   . Sciatica   . Seasonal allergies   . TIA (transient ischemic attack)    Past Surgical History:  Procedure Laterality Date  . APPENDECTOMY    . KNEE ARTHROSCOPY WITH MENISCAL REPAIR Right 2009  . KNEE SURGERY Right   . T&A    . TONSILLECTOMY     Family History  Problem Relation Age of Onset  . Parkinson's disease Father   . Coronary artery disease Mother   . Heart attack Sister   . Deep vein thrombosis Sister    Social History   Socioeconomic History  . Marital status: Married    Spouse name: [name]  . Number of children: 0  . Years of education: Not on file  . Highest education level: Some college, no degree  Occupational History  . Occupation: retired    Comment: machinest  Tobacco Use  . Smoking status: Never Smoker  . Smokeless tobacco: Never Used  Substance and Sexual Activity  . Alcohol use: No    Alcohol/week: 0.0 standard drinks  . Drug use: No  . Sexual activity: Not on file  Other Topics  Concern  . Not on file  Social History Narrative   ** Merged History Encounter **       Social Determinants of Health   Financial Resource Strain: Low Risk   . Difficulty of Paying Living Expenses: Not hard at all  Food Insecurity: No Food Insecurity  . Worried About Charity fundraiser in the Last Year: Never true  . Ran Out of Food in the Last Year: Never true  Transportation Needs:   . Lack of Transportation (Medical): Not on file  . Lack of Transportation (Non-Medical): Not on file  Physical Activity:   . Days of Exercise per Week: Not on file  . Minutes of Exercise per Session: Not on file  Stress: No Stress Concern Present  . Feeling of Stress : Not at  all  Social Connections:   . Frequency of Communication with Friends and Family: Not on file  . Frequency of Social Gatherings with Friends and Family: Not on file  . Attends Religious Services: Not on file  . Active Member of Clubs or Organizations: Not on file  . Attends Archivist Meetings: Not on file  . Marital Status: Not on file    Outpatient Encounter Medications as of 06/06/2019  Medication Sig  . acetaminophen (TYLENOL) 325 MG tablet Take 325 mg by mouth every 6 (six) hours as needed.  Marland Kitchen amantadine (SYMMETREL) 100 MG capsule TAKE 1 CAPSULE THREE TIMES DAILY  . amLODipine (NORVASC) 2.5 MG tablet Take 0.5 tablets (1.25 mg total) by mouth daily. (Patient taking differently: Take 1.25 mg by mouth daily. One tablet daily)  . carbidopa-levodopa (SINEMET CR) 50-200 MG tablet Take 1 tablet by mouth at bedtime.  . carbidopa-levodopa (SINEMET IR) 25-100 MG tablet TAKE 2 TABLETS AT 6AM, 10AM, AND 2PM, AND TAKE 1 TABLET AT 6PM AS DIRECTED.  Marland Kitchen cetirizine (ZYRTEC) 10 MG tablet Take 10 mg by mouth daily.  . diclofenac Sodium (VOLTAREN) 1 % GEL APPLY TOPICALLY 4 GRAMS FOUR TIMES DAILY  . lisinopril (ZESTRIL) 30 MG tablet Take 1 tablet (30 mg total) by mouth daily.  . Melatonin 2.5 MG CAPS Take 1 capsule by mouth at bedtime as needed (for sleep).   . metoprolol succinate (TOPROL-XL) 25 MG 24 hr tablet TAKE 1 TABLET EVERY DAY  . Multiple Vitamin (MULTIVITAMIN) tablet Take 1 tablet by mouth daily.  Marland Kitchen PREVIDENT 5000 BOOSTER PLUS 1.1 % PSTE See admin instructions.  . rivaroxaban (XARELTO) 20 MG TABS tablet Take 1 tablet (20 mg total) by mouth daily.  Marland Kitchen rOPINIRole (REQUIP) 2 MG tablet TAKE 1 TABLET THREE TIMES DAILY  . sildenafil (REVATIO) 20 MG tablet Take 1 tablet (20 mg total) by mouth 3 (three) times daily as needed.  . simvastatin (ZOCOR) 40 MG tablet TAKE 1 TABLET EVERY DAY  . tamsulosin (FLOMAX) 0.4 MG CAPS capsule TAKE 1 CAPSULE EVERY DAY  . traZODone (DESYREL) 50 MG tablet Take  0.5-1 tablets (25-50 mg total) by mouth at bedtime as needed for sleep.  . [DISCONTINUED] trimethobenzamide (TIGAN) 300 MG capsule Take 1 capsule (300 mg total) by mouth 3 (three) times daily. (Patient not taking: Reported on 06/06/2019)   No facility-administered encounter medications on file as of 06/06/2019.    Activities of Daily Living In your present state of health, do you have any difficulty performing the following activities: 06/06/2019  Hearing? N  Comment no hearing aids  Vision? Y  Comment corrected with eyeglasses, Dr.Woodard  Difficulty concentrating or making decisions? Y  Comment  concentration.  Walking or climbing stairs? N  Comment uses for exercise  Dressing or bathing? N  Doing errands, shopping? N  Preparing Food and eating ? N  Using the Toilet? N  In the past six months, have you accidently leaked urine? N  Do you have problems with loss of bowel control? N  Managing your Medications? N  Managing your Finances? N  Housekeeping or managing your Housekeeping? N  Some recent data might be hidden    Patient Care Team: [name], DO as PCP - General (Family Medicine) Tat, Eustace Quail, DO as Consulting Physician (Neurology) Odette Fraction [facility]) Rockey Situ, Kathlene November, MD as Consulting Physician (Cardiology) Samara Deist, DPM as Referring Physician (Podiatry) Vanita Ingles, RN as Case Manager (General Practice)   Assessment:   This is a routine wellness examination for Malikye.  Exercise Activities and Dietary recommendations Current Exercise Habits: Home exercise routine;Structured exercise class(boxing 3 times a week.), Type of exercise: strength training/weights;walking;Other - see comments;stretching(stationary bike twice a week. boxing 3 times a week), Time (Minutes): 30, Frequency (Times/Week): 3, Weekly Exercise (Minutes/Week): 90, Intensity: Mild, Exercise limited by: None identified  Goals    . DIET - INCREASE WATER INTAKE     Recommend  drinking at least 6-8 glasses of water a day     . PharmD "I want to make sure his medications are safe" (pt-stated)     Current Barriers:  . Polypharmacy; complex patient with multiple comorbidities including Parkinson's disease, atrial fibrillation (hx DVT, PE), HTN, BPH, insomnia . Wife assists in managing medications o PD: follows w/ Dr. Carles Collet. Carbidopa/levodopa 25/100, 2 tab at 6 am, 10 am, 2 pm, 6 pm; carbidopa/levodopa 50/200 QPM; amantadine 100 mg TID, ropinirole 2 mg TID. Had been prescribed SL apomorphine, however, d/t improvement w/ amantadine and cost of SL apomorphine, declined this medication. Notes he received the hospital bed rail and it has been immensely helpful  o Afib, HTN: Xarelto 20 mg QPM; metoprolol succinate 25 mg daily, lisinopril 40 mg daily (10 mg + 30 mg tab), amlodipine 2.5 mg PRN (has not needed) o ASCVD risk reduction: simvastatin 40 mg daily; LDL well controlled <70 o BPH: tamsulosin 0.4 mg daily; sildenafil 20 mg PRN o Insomnia: Trazodone 25 mg QPM + melatonin 2.5 mg QPM; notes this combination has been effective   Pharmacist Clinical Goal(s):  Marland Kitchen Over the next 90 days, patient will work with PharmD and provider towards optimized medication management  Interventions: . Comprehensive medication review performed; medication list updated in electronic medical record . Patient's wife asked if there are any drug interactions or dangers of renal/hepatic impairment from his regimen. Reviewed interactions. Discussed that multiple medications can cause CNS sedation, and to be cognizant for increased sedation with aging or dose increases. Discussed that PD medications and BP medications increase risk of hypotension; continue to monitor for s/sx orthostatic hypotension . If patient starts to need amlodipine regularly, recommend reducing simvasatin to 20 mg daily or changing to alternative statin, d/t DDI w/ amlodipine and simvastatin that increases simvastatin  concentrations/risk for myalgias . Patient and wife deny and cost concerns at this time.   Patient Self Care Activities:  . Patient will take medications as prescribed  Initial goal documentation     . RN- I want to stay independent (pt-stated)     Current Barriers:  . Chronic Disease Management support and education needs related to Parkinson's Disease, HTN, and HLD  Nurse Case Manager Clinical Goal(s):  .  Over the next 90 days, patient will work with [facility]  to address needs related to remaining independent   Interventions:  . Discussed plans with patient for ongoing care management follow up and provided patient with direct contact information for care management team . Evaluated the use of the bed rail.  The patient states this is very helpful for him and he is glad he has this to use . Assessed new safety concerns with the patient.  The patient uses a "hemi-walker" in the am for about 30 minutes before attempting to ambulate without assistance.  The patient feels safe in his home environment.  The patient verbalized he has had not falls.  Patient Self Care Activities:  . Attends all scheduled provider appointments . Performs ADL's independently . Patient wanting to stay independent as long as possible  Please see past updates related to this goal by clicking on the "Past Updates" button in the selected goal      . RNCM: I am taking my blood pressure 2 times a day.  I have a new pill I am taking for my blood pressure     Current Barriers:  . Chronic Disease Management support and education needs related to HTN and HLD   Nurse Case Manager Clinical Goal(s):  Marland Kitchen Over the next 90 days, patient will verbalize understanding of plan for HTN and HLD . Over the next 90 days, patient will attend all scheduled medical appointments: next appointment pcp on 06-28-2019 . Over the next 90 days, patient will demonstrate improved adherence to prescribed treatment plan for HTN and HLD as evidenced  by continuing to monitor blood pressure BID, and adherence to a low sodium, heart healthy diet . Over the next 90 days, patient will demonstrate improved health management independence as evidenced by continuing to independently maintain health and well being with the assistance of his spouse when needed . Over the next 90 days, patient will work with CM team pharmacist to effectively manage medications and monitor for any issues such as orthostatic hypotension related to the patient taking amlodipine 2.5mg    Interventions:  . Evaluation of current treatment plan related to HTN and HLD and patient's adherence to plan as established by provider. . Provided education to patient re: orthostatic hypotension, changing position slowly and safety concerns in the patient with multiple chronic disease processes . Discussed plans with patient for ongoing care management follow up and provided patient with direct contact information for care management team . Advised patient, providing education and rationale, to monitor blood pressure daily and record, calling pcp for findings outside established parameters.  . Reviewed scheduled/upcoming provider appointments including: CCM team and pcp scheduled for 06-28-2019 . Pharmacy referral for medication management and follow up for any new concerns related to addition of amlodipine to the current treatment regimen.   Patient Self Care Activities:  . Patient verbalizes understanding of plan to manage HTN and HLD . Attends all scheduled provider appointments . Calls provider office for new concerns or questions . Unable to independently manage chronic conditions of HTN and HLD  Initial goal documentation        Fall Risk: Fall Risk  06/06/2019 01/27/2019 11/01/2018 09/08/2018 06/03/2018  Falls in the past year? 0 0 0 0 0  Number falls in past yr: 0 0 0 0 -  Injury with Fall? 0 0 0 0 -  Risk for fall due to : - - History of fall(s) - -  Follow up - - Falls  evaluation completed - -    FALL RISK PREVENTION PERTAINING TO THE HOME:  Any stairs in or around the home? Yes  If so, are there any without handrails? No   Home free of loose throw rugs in walkways, pet beds, electrical cords, etc? Yes  Adequate lighting in your home to reduce risk of falls? Yes   ASSISTIVE DEVICES UTILIZED TO PREVENT FALLS:  Life alert? No  Use of a cane, walker or w/c? No  Grab bars in the bathroom? No  Shower chair or bench in shower? No  Elevated toilet seat or a handicapped toilet? No   TIMED UP AND GO:  Was the test performed? Yes .  Length of time to ambulate 10 feet: 9 sec.   GAIT:  Appearance of gait: Gait slow and steady without the use of an assistive device.  Education: Fall risk prevention has been discussed.  Intervention(s) required? No  DME/home health order needed?  No   Depression Screen PHQ 2/9 Scores 06/06/2019 03/30/2019 12/21/2018 06/03/2018  PHQ - 2 Score 1 1 0 0  PHQ- 9 Score - 4 4 -    Cognitive Function MMSE - Mini Mental State Exam 04/23/2018 07/27/2017 03/27/2017  Not completed: Unable to complete Unable to complete Unable to complete     6CIT Screen 06/03/2018 05/01/2017 03/28/2016  What Year? 0 points 0 points 0 points  What month? 0 points 0 points 0 points  What time? 0 points 0 points 0 points  Count back from 20 0 points 0 points 0 points  Months in reverse 0 points 0 points 0 points  Repeat phrase 0 points 0 points 2 points  Total Score 0 0 2    Immunization History  Administered Date(s) Administered  . Fluad Quad(high Dose 65+) 01/10/2019  . Influenza, High Dose Seasonal PF 02/13/2017, 12/16/2017  . Influenza,inj,Quad PF,6+ Mos 02/05/2015  . Influenza-Unspecified 02/05/2015, 01/27/2016  . Pneumococcal Conjugate-13 06/18/2015  . Pneumococcal Polysaccharide-23 05/01/2017  . Tdap 02/16/2014  . Zoster 12/16/2014    Qualifies for Shingles Vaccine? Yes  Zostavax completed 2016. Due for Shingrix. Education has  been provided regarding the importance of this vaccine. Pt has been advised to call insurance company to determine out of pocket expense. Advised may also receive vaccine at local pharmacy or Health Dept. Verbalized acceptance and understanding.  Tdap: up to date   Flu Vaccine: up to date   Pneumococcal Vaccine: up to date   Screening Tests Health Maintenance  Topic Date Due  . Fecal DNA (Cologuard)  04/16/2019  . TETANUS/TDAP  02/17/2024  . INFLUENZA VACCINE  Completed  . Hepatitis C Screening  Completed  . PNA vac Low Risk Adult  Completed   Cancer Screenings:  Colorectal Screening: Completed cologuard  Repeat every 3 years; ordered   Lung Cancer Screening: (Low Dose CT Chest recommended if Age 70-80 years, 30 pack-year currently smoking OR have quit w/in 15years.) does not qualify.     Additional Screening:  Hepatitis C Screening: does qualify; Completed 08/23/2015  Vision Screening: Recommended annual ophthalmology exams for early detection of glaucoma and other disorders of the eye. Is the patient up to date with their annual eye exam?  Yes  Who is the provider or what is the name of the office in which the pt attends annual eye exams? Dr.Woodard    Dental Screening: Recommended annual dental exams for proper oral hygiene  Community Resource Referral:  CRR required this visit?  No  Plan:  I have personally reviewed and addressed the Medicare Annual Wellness questionnaire and have noted the following in the patient's chart:  A. Medical and social history B. Use of alcohol, tobacco or illicit drugs  C. Current medications and supplements D. Functional ability and status E.  Nutritional status F.  Physical activity G. Advance directives H. List of other physicians I.  Hospitalizations, surgeries, and ER visits in previous 12 months J.  [place] such as hearing and vision if needed, cognitive and depression L. Referrals and appointments   In  addition, I have reviewed and discussed with patient certain preventive protocols, quality metrics, and best practice recommendations. A written personalized care plan for preventive services as well as general preventive health recommendations were provided to patient.   Signed,   Bevelyn Ngo, LPN  [id number] Nurse Health Advisor   Nurse Notes: none

## 2019-06-06 NOTE — Patient Instructions (Signed)
[name] , Thank you for taking time to come for your Medicare Wellness Visit. I appreciate your ongoing commitment to your health goals. Please review the following plan we discussed and let me know if I can assist you in the future.   Screening recommendations/referrals: Colonoscopy: cologuard ordered  Recommended yearly ophthalmology/optometry visit for glaucoma screening and checkup Recommended yearly dental visit for hygiene and checkup  Vaccinations: Influenza vaccine: up to date  Pneumococcal vaccine: up to date  Tdap vaccine: up to date  Shingles vaccine: shingrix eligible Covid-19: We are recommending the vaccine to everyone who has not had an allergic reaction to any of the components of the vaccine. If you have specific questions about the vaccine, please bring them up with your health care provider to discuss them.   We will likely not be getting the vaccine in the office for the first rounds of vaccinations. The way they are releasing the vaccines is going to be through the health systems (like [place], [place], [facility], [facility]), through your county health department, or through the pharmacies.   [facility] is giving vaccines to those 65+ and [employer] and [place] providers start 06/08/19 Call [phone] to schedule  If you are 65+ you can get a vaccine through [facility] by signing up for an appointment.  You can sign up by going to: [email or web address].  You can get more information by going to: [email or web address]  [employer] next door is giving the [employer]- you can call [phone] or stop by there to schedule.    Advanced directives: copy on file   Conditions/risks identified: continue the great exercise routine   Next appointment: follow up in one year for your annual wellness visit   Preventive Care 65 Years and Older, Male Preventive care refers to lifestyle choices and visits with  your health care provider that can promote health and wellness. What does preventive care include?  A yearly physical exam. This is also called an annual well check.  Dental exams once or twice a year.  Routine eye exams. Ask your health care provider how often you should have your eyes checked.  Personal lifestyle choices, including:  Daily care of your teeth and gums.  Regular physical activity.  Eating a healthy diet.  Avoiding tobacco and drug use.  Limiting alcohol use.  Practicing safe sex.  Taking low doses of aspirin every day.  Taking vitamin and mineral supplements as recommended by your health care provider. What happens during an annual well check? The services and screenings done by your health care provider during your annual well check will depend on your age, overall health, lifestyle risk factors, and family history of disease. Counseling  Your health care provider may ask you questions about your:  Alcohol use.  Tobacco use.  Drug use.  Emotional well-being.  Home and relationship well-being.  Sexual activity.  Eating habits.  History of falls.  Memory and ability to understand (cognition).  Work and work Statistician. Screening  You may have the following tests or measurements:  Height, weight, and BMI.  Blood pressure.  Lipid and cholesterol levels. These may be checked every 5 years, or more frequently if you are over 26 years old.  Skin check.  Lung cancer screening. You may have this screening every year starting at age 13 if you have a 30-pack-year history of smoking and currently smoke or have quit within the past 15 years.  Fecal occult blood test (  FOBT) of the stool. You may have this test every year starting at age 81.  Flexible sigmoidoscopy or colonoscopy. You may have a sigmoidoscopy every 5 years or a colonoscopy every 10 years starting at age 31.  Prostate cancer screening. Recommendations will vary depending on your  family history and other risks.  Hepatitis C blood test.  Hepatitis B blood test.  Sexually transmitted disease (STD) testing.  Diabetes screening. This is done by checking your blood sugar (glucose) after you have not eaten for a while (fasting). You may have this done every 1-3 years.  Abdominal aortic aneurysm (AAA) screening. You may need this if you are a current or former smoker.  Osteoporosis. You may be screened starting at age 72 if you are at high risk. Talk with your health care provider about your test results, treatment options, and if necessary, the need for more tests. Vaccines  Your health care provider may recommend certain vaccines, such as:  Influenza vaccine. This is recommended every year.  Tetanus, diphtheria, and acellular pertussis (Tdap, Td) vaccine. You may need a Td booster every 10 years.  Zoster vaccine. You may need this after age 53.  Pneumococcal 13-valent conjugate (PCV13) vaccine. One dose is recommended after age 2.  Pneumococcal polysaccharide (PPSV23) vaccine. One dose is recommended after age 34. Talk to your health care provider about which screenings and vaccines you need and how often you need them. This information is not intended to replace advice given to you by your health care provider. Make sure you discuss any questions you have with your health care provider. Document Released: 04/27/2015 Document Revised: 12/19/2015 Document Reviewed: 01/30/2015 Elsevier Interactive Patient Education  2017 [employer] Prevention in the Home Falls can cause injuries. They can happen to people of all ages. There are many things you can do to make your home safe and to help prevent falls. What can I do on the outside of my home?  Regularly fix the edges of walkways and driveways and fix any cracks.  Remove anything that might make you trip as you walk through a door, such as a raised step or threshold.  Trim any bushes or trees on the  path to your home.  Use bright outdoor lighting.  Clear any walking paths of anything that might make someone trip, such as rocks or tools.  Regularly check to see if handrails are loose or broken. Make sure that both sides of any steps have handrails.  Any raised decks and porches should have guardrails on the edges.  Have any leaves, snow, or ice cleared regularly.  Use sand or salt on walking paths during winter.  Clean up any spills in your garage right away. This includes oil or grease spills. What can I do in the bathroom?  Use night lights.  Install grab bars by the toilet and in the tub and shower. Do not use towel bars as grab bars.  Use non-skid mats or decals in the tub or shower.  If you need to sit down in the shower, use a plastic, non-slip stool.  Keep the floor dry. Clean up any water that spills on the floor as soon as it happens.  Remove soap buildup in the tub or shower regularly.  Attach bath mats securely with double-sided non-slip rug tape.  Do not have throw rugs and other things on the floor that can make you trip. What can I do in the bedroom?  Use night lights.  Make  sure that you have a light by your bed that is easy to reach.  Do not use any sheets or blankets that are too big for your bed. They should not hang down onto the floor.  Have a firm chair that has side arms. You can use this for support while you get dressed.  Do not have throw rugs and other things on the floor that can make you trip. What can I do in the kitchen?  Clean up any spills right away.  Avoid walking on wet floors.  Keep items that you use a lot in easy-to-reach places.  If you need to reach something above you, use a strong step stool that has a grab bar.  Keep electrical cords out of the way.  Do not use floor polish or wax that makes floors slippery. If you must use wax, use non-skid floor wax.  Do not have throw rugs and other things on the floor that can  make you trip. What can I do with my stairs?  Do not leave any items on the stairs.  Make sure that there are handrails on both sides of the stairs and use them. Fix handrails that are broken or loose. Make sure that handrails are as long as the stairways.  Check any carpeting to make sure that it is firmly attached to the stairs. Fix any carpet that is loose or worn.  Avoid having throw rugs at the top or bottom of the stairs. If you do have throw rugs, attach them to the floor with carpet tape.  Make sure that you have a light switch at the top of the stairs and the bottom of the stairs. If you do not have them, ask someone to add them for you. What else can I do to help prevent falls?  Wear shoes that:  Do not have high heels.  Have rubber bottoms.  Are comfortable and fit you well.  Are closed at the toe. Do not wear sandals.  If you use a stepladder:  Make sure that it is fully opened. Do not climb a closed stepladder.  Make sure that both sides of the stepladder are locked into place.  Ask someone to hold it for you, if possible.  Clearly mark and make sure that you can see:  Any grab bars or handrails.  First and last steps.  Where the edge of each step is.  Use tools that help you move around (mobility aids) if they are needed. These include:  Canes.  Walkers.  Scooters.  Crutches.  Turn on the lights when you go into a dark area. Replace any light bulbs as soon as they burn out.  Set up your furniture so you have a clear path. Avoid moving your furniture around.  If any of your floors are uneven, fix them.  If there are any pets around you, be aware of where they are.  Review your medicines with your doctor. Some medicines can make you feel dizzy. This can increase your chance of falling. Ask your doctor what other things that you can do to help prevent falls. This information is not intended to replace advice given to you by your health care  provider. Make sure you discuss any questions you have with your health care provider. Document Released: 01/25/2009 Document Revised: 09/06/2015 Document Reviewed: 05/05/2014 Elsevier Interactive Patient Education  2017 [employer].

## 2019-06-20 DIAGNOSIS — Z1211 Encounter for screening for malignant neoplasm of colon: Secondary | ICD-10-CM | POA: Diagnosis not present

## 2019-06-20 LAB — COLOGUARD: Cologuard: POSITIVE — AB

## 2019-06-23 LAB — COLOGUARD: COLOGUARD: POSITIVE — AB

## 2019-06-27 ENCOUNTER — Telehealth: Payer: Self-pay

## 2019-06-27 NOTE — Telephone Encounter (Signed)
Positive cologuard result collected on 06/20/19

## 2019-06-28 ENCOUNTER — Encounter: Payer: Self-pay | Admitting: Family Medicine

## 2019-06-28 ENCOUNTER — Ambulatory Visit (INDEPENDENT_AMBULATORY_CARE_PROVIDER_SITE_OTHER): Payer: Medicare HMO | Admitting: Family Medicine

## 2019-06-28 ENCOUNTER — Other Ambulatory Visit: Payer: Self-pay

## 2019-06-28 VITALS — BP 137/86 | HR 56 | Temp 98.2°F

## 2019-06-28 DIAGNOSIS — G20 Parkinson's disease: Secondary | ICD-10-CM | POA: Diagnosis not present

## 2019-06-28 DIAGNOSIS — I2699 Other pulmonary embolism without acute cor pulmonale: Secondary | ICD-10-CM | POA: Diagnosis not present

## 2019-06-28 DIAGNOSIS — D6851 Activated protein C resistance: Secondary | ICD-10-CM

## 2019-06-28 DIAGNOSIS — E7800 Pure hypercholesterolemia, unspecified: Secondary | ICD-10-CM

## 2019-06-28 DIAGNOSIS — R350 Frequency of micturition: Secondary | ICD-10-CM | POA: Diagnosis not present

## 2019-06-28 DIAGNOSIS — I82502 Chronic embolism and thrombosis of unspecified deep veins of left lower extremity: Secondary | ICD-10-CM | POA: Diagnosis not present

## 2019-06-28 DIAGNOSIS — I4891 Unspecified atrial fibrillation: Secondary | ICD-10-CM | POA: Diagnosis not present

## 2019-06-28 DIAGNOSIS — I10 Essential (primary) hypertension: Secondary | ICD-10-CM

## 2019-06-28 DIAGNOSIS — I480 Paroxysmal atrial fibrillation: Secondary | ICD-10-CM | POA: Diagnosis not present

## 2019-06-28 DIAGNOSIS — Z0000 Encounter for general adult medical examination without abnormal findings: Secondary | ICD-10-CM

## 2019-06-28 DIAGNOSIS — Z1211 Encounter for screening for malignant neoplasm of colon: Secondary | ICD-10-CM

## 2019-06-28 DIAGNOSIS — I2782 Chronic pulmonary embolism: Secondary | ICD-10-CM

## 2019-06-28 DIAGNOSIS — I82409 Acute embolism and thrombosis of unspecified deep veins of unspecified lower extremity: Secondary | ICD-10-CM | POA: Diagnosis not present

## 2019-06-28 DIAGNOSIS — R195 Other fecal abnormalities: Secondary | ICD-10-CM

## 2019-06-28 DIAGNOSIS — N401 Enlarged prostate with lower urinary tract symptoms: Secondary | ICD-10-CM

## 2019-06-28 DIAGNOSIS — G4701 Insomnia due to medical condition: Secondary | ICD-10-CM

## 2019-06-28 HISTORY — DX: Activated protein C resistance: D68.51

## 2019-06-28 LAB — MICROALBUMIN, URINE WAIVED
Creatinine, Urine Waived: 100 mg/dL (ref 10–300)
Microalb, Ur Waived: 10 mg/L (ref 0–19)
Microalb/Creat Ratio: <30 mg/g (ref <30)

## 2019-06-28 LAB — UA/M W/RFLX CULTURE, ROUTINE
Bilirubin, UA: NEGATIVE
Glucose, UA: NEGATIVE
Leukocytes,UA: NEGATIVE
Nitrite, UA: NEGATIVE
Protein,UA: NEGATIVE
RBC, UA: NEGATIVE
Specific Gravity, UA: 1.025 (ref 1.005–1.030)
Urobilinogen, Ur: 1 mg/dL (ref 0.2–1.0)
pH, UA: 6 (ref 5.0–7.5)

## 2019-06-28 MED ORDER — TAMSULOSIN HCL 0.4 MG PO CAPS: 0.4000 mg | ORAL_CAPSULE | Freq: Every day | ORAL | 1 refills | Status: DC

## 2019-06-28 MED ORDER — AMLODIPINE BESYLATE 2.5 MG PO TABS: 1.2500 mg | ORAL_TABLET | Freq: Every day | ORAL | 1 refills | Status: DC

## 2019-06-28 MED ORDER — METOPROLOL SUCCINATE ER 25 MG PO TB24: 25.0000 mg | ORAL_TABLET | Freq: Every day | ORAL | 0 refills | Status: DC

## 2019-06-28 MED ORDER — SIMVASTATIN 40 MG PO TABS: 40.0000 mg | ORAL_TABLET | Freq: Every day | ORAL | 1 refills | Status: DC

## 2019-06-28 MED ORDER — TRAZODONE HCL 50 MG PO TABS: 25.0000 mg | ORAL_TABLET | Freq: Every evening | ORAL | 1 refills | Status: DC | PRN

## 2019-06-28 MED ORDER — LISINOPRIL 30 MG PO TABS: 30.0000 mg | ORAL_TABLET | Freq: Every day | ORAL | 0 refills | Status: DC

## 2019-06-28 NOTE — Progress Notes (Signed)
BP 137/86   Pulse (!) 56   Temp 98.2 F (36.8 C)   SpO2 99%    Subjective:    Patient ID: Thomas [name], male    DOB: March 31, 1951, 69 y.o.   MRN: [id number]  HPI: Thomas [name] is a 69 y.o. male presenting on 06/28/2019 for comprehensive medical examination. Current medical complaints include:  HYPERTENSION / HYPERLIPIDEMIA- has been running low in the AM Satisfied with current treatment? yes Duration of hypertension: chronic BP monitoring frequency: a few times a day BP medication side effects: no Past BP meds: amlodipine, lisinopril, metoprolol Duration of hyperlipidemia: chronic Cholesterol medication side effects: no Cholesterol supplements: none Past cholesterol medications: simvastatin Medication compliance: excellent compliance Aspirin: no Recent stressors: yes Recurrent headaches: no Visual changes: no Palpitations: no Dyspnea: no Chest pain: no Lower extremity edema: no Dizzy/lightheaded: no  He currently lives with: wife Interim Problems from his last visit: no  Depression Screen done today and results listed below:  Depression screen [facility] 2/9 06/28/2019 06/06/2019 03/30/2019 12/21/2018 06/03/2018  Decreased Interest 0 0 1 0 0  Down, Depressed, Hopeless 0 1 0 0 0  PHQ - 2 Score 0 1 1 0 0  Altered sleeping 0 - 0 0 -  Tired, decreased energy 0 - 0 1 -  Change in appetite 0 - 0 0 -  Feeling bad or failure about yourself  0 - 0 0 -  Trouble concentrating 0 - 3 3 -  Moving slowly or fidgety/restless 0 - 0 0 -  Suicidal thoughts 0 - 0 0 -  PHQ-9 Score 0 - 4 4 -  Difficult doing work/chores Not difficult at all - Not difficult at all Not difficult at all -    Past Medical History:  Past Medical History:  Diagnosis Date  . Allergy   . Arthritis    Osteoarthritis  . Arthritis of knee   . Atrial fibrillation ([place])   . DVT (deep venous thrombosis) (HCC)    L leg  . Hyperchloremia   . Hypercholesterolemia   . Hypertension   . Nephrolithiasis   . Parkinson's  disease ([place])   . Plantar wart   . Pulmonary embolism ([place])   . Sciatica   . Seasonal allergies   . TIA (transient ischemic attack)     Surgical History:  Past Surgical History:  Procedure Laterality Date  . APPENDECTOMY    . KNEE ARTHROSCOPY WITH MENISCAL REPAIR Right 2009  . KNEE SURGERY Right   . T&A    . TONSILLECTOMY      Medications:  Current Outpatient Medications on File Prior to Visit  Medication Sig  . acetaminophen (TYLENOL) 325 MG tablet Take 325 mg by mouth every 6 (six) hours as needed.  Marland Kitchen amantadine (SYMMETREL) 100 MG capsule TAKE 1 CAPSULE THREE TIMES DAILY  . carbidopa-levodopa (SINEMET CR) 50-200 MG tablet Take 1 tablet by mouth at bedtime.  . carbidopa-levodopa (SINEMET IR) 25-100 MG tablet TAKE 2 TABLETS AT 6AM, 10AM, AND 2PM, AND TAKE 1 TABLET AT 6PM AS DIRECTED.  Marland Kitchen cetirizine (ZYRTEC) 10 MG tablet Take 10 mg by mouth daily.  . diclofenac Sodium (VOLTAREN) 1 % GEL APPLY TOPICALLY 4 GRAMS FOUR TIMES DAILY  . Melatonin 2.5 MG CAPS Take 1 capsule by mouth at bedtime as needed (for sleep).   . Multiple Vitamin (MULTIVITAMIN) tablet Take 1 tablet by mouth daily.  Marland Kitchen PREVIDENT 5000 BOOSTER PLUS 1.1 % PSTE See admin instructions.  . rivaroxaban (XARELTO) 20 MG TABS tablet  Take 1 tablet (20 mg total) by mouth daily.  Marland Kitchen rOPINIRole (REQUIP) 2 MG tablet TAKE 1 TABLET THREE TIMES DAILY  . sildenafil (REVATIO) 20 MG tablet Take 1 tablet (20 mg total) by mouth 3 (three) times daily as needed.   No current facility-administered medications on file prior to visit.    Allergies:  Allergies  Allergen Reactions  . Codeine Other (See Comments)    Reaction: Hallucinations    Social History:  Social History   Socioeconomic History  . Marital status: Married    Spouse name: [name]  . Number of children: 0  . Years of education: Not on file  . Highest education level: Some college, no degree  Occupational History  . Occupation: retired    Comment: machinest    Tobacco Use  . Smoking status: Never Smoker  . Smokeless tobacco: Never Used  Substance and Sexual Activity  . Alcohol use: No    Alcohol/week: 0.0 standard drinks  . Drug use: No  . Sexual activity: Not on file  Other Topics Concern  . Not on file  Social History Narrative   ** Merged History Encounter **       Social Determinants of Health   Financial Resource Strain: Low Risk   . Difficulty of Paying Living Expenses: Not hard at all  Food Insecurity: No Food Insecurity  . Worried About Charity fundraiser in the Last Year: Never true  . Ran Out of Food in the Last Year: Never true  Transportation Needs:   . Lack of Transportation (Medical):   Marland Kitchen Lack of Transportation (Non-Medical):   Physical Activity:   . Days of Exercise per Week:   . Minutes of Exercise per Session:   Stress: No Stress Concern Present  . Feeling of Stress : Not at all  Social Connections:   . Frequency of Communication with Friends and Family:   . Frequency of Social Gatherings with Friends and Family:   . Attends Religious Services:   . Active Member of Clubs or Organizations:   . Attends Archivist Meetings:   Marland Kitchen Marital Status:   Intimate Partner Violence:   . Fear of Current or Ex-Partner:   . Emotionally Abused:   Marland Kitchen Physically Abused:   . Sexually Abused:    Social History   Tobacco Use  Smoking Status Never Smoker  Smokeless Tobacco Never Used   Social History   Substance and Sexual Activity  Alcohol Use No  . Alcohol/week: 0.0 standard drinks    Family History:  Family History  Problem Relation Age of Onset  . Parkinson's disease Father   . Coronary artery disease Mother   . Heart attack Sister   . Deep vein thrombosis Sister     Past medical history, surgical history, medications, allergies, family history and social history reviewed with patient today and changes made to appropriate areas of the chart.   Review of Systems  Constitutional: Negative.   HENT:  Negative.   Eyes: Positive for blurred vision. Negative for double vision, photophobia, pain, discharge and redness.  Respiratory: Negative.   Cardiovascular: Negative.   Gastrointestinal: Positive for blood in stool, constipation, diarrhea and heartburn. Negative for abdominal pain, melena, nausea and vomiting.  Genitourinary: Negative.   Musculoskeletal: Positive for myalgias. Negative for back pain, falls, joint pain and neck pain.  Skin: Negative.   Neurological: Positive for tremors, weakness and headaches. Negative for dizziness, tingling, sensory change, speech change, focal weakness, seizures and loss of  consciousness.  Endo/Heme/Allergies: Negative.   Psychiatric/Behavioral: Negative.     All other ROS negative except what is listed above and in the HPI.      Objective:    BP 137/86   Pulse (!) 56   Temp 98.2 F (36.8 C)   SpO2 99%   Wt Readings from Last 3 Encounters:  06/06/19 249 lb (112.9 kg)  05/20/19 241 lb (109.3 kg)  03/15/19 242 lb (109.8 kg)    Physical Exam Vitals and nursing note reviewed.  Constitutional:      General: He is not in acute distress.    Appearance: Normal appearance. He is obese. He is not ill-appearing, toxic-appearing or diaphoretic.  HENT:     Head: Normocephalic and atraumatic.     Right Ear: Tympanic membrane, ear canal and external ear normal. There is no impacted cerumen.     Left Ear: Tympanic membrane, ear canal and external ear normal. There is no impacted cerumen.     Nose: Nose normal. No congestion or rhinorrhea.     Mouth/Throat:     Mouth: Mucous membranes are moist.     Pharynx: Oropharynx is clear. No oropharyngeal exudate or posterior oropharyngeal erythema.  Eyes:     General: No scleral icterus.       Right eye: No discharge.        Left eye: No discharge.     Extraocular Movements: Extraocular movements intact.     Conjunctiva/sclera: Conjunctivae normal.     Pupils: Pupils are equal, round, and reactive to light.   Neck:     Vascular: No carotid bruit.  Cardiovascular:     Rate and Rhythm: Normal rate and regular rhythm.     Pulses: Normal pulses.     Heart sounds: No murmur. No friction rub. No gallop.   Pulmonary:     Effort: Pulmonary effort is normal. No respiratory distress.     Breath sounds: Normal breath sounds. No stridor. No wheezing, rhonchi or rales.  Chest:     Chest wall: No tenderness.  Abdominal:     General: Abdomen is flat. Bowel sounds are normal. There is no distension.     Palpations: Abdomen is soft. There is no mass.     Tenderness: There is no abdominal tenderness. There is no right CVA tenderness, left CVA tenderness, guarding or rebound.     Hernia: No hernia is present.  Genitourinary:    Comments: Genital exam deferred with shared decision making Musculoskeletal:        General: No swelling, tenderness, deformity or signs of injury.     Cervical back: Normal range of motion and neck supple. No rigidity. No muscular tenderness.     Right lower leg: No edema.     Left lower leg: No edema.  Lymphadenopathy:     Cervical: No cervical adenopathy.  Skin:    General: Skin is warm and dry.     Capillary Refill: Capillary refill takes less than 2 seconds.     Coloration: Skin is not jaundiced or pale.     Findings: No bruising, erythema, lesion or rash.  Neurological:     General: No focal deficit present.     Mental Status: He is alert and oriented to person, place, and time.     Cranial Nerves: No cranial nerve deficit.     Sensory: No sensory deficit.     Motor: No weakness.     Coordination: Coordination normal.     Gait: Gait  normal.     Deep Tendon Reflexes: Reflexes normal.  Psychiatric:        Mood and Affect: Mood normal.        Behavior: Behavior normal.        Thought Content: Thought content normal.        Judgment: Judgment normal.     Results for orders placed or performed in visit on 06/27/19  Cologuard  Result Value Ref Range   Cologuard  Positive (A) Negative      Assessment & Plan:   Problem List Items Addressed This Visit      Cardiovascular and Mediastinum   Hypertension    Continues to be very labile. Stressed hydration. Continue current regimen. Continue to monitor. Call with any concerns. Refills given today.      Relevant Medications   amLODipine (NORVASC) 2.5 MG tablet   lisinopril (ZESTRIL) 30 MG tablet   metoprolol succinate (TOPROL-XL) 25 MG 24 hr tablet   simvastatin (ZOCOR) 40 MG tablet   Other Relevant Orders   CBC with Differential/Platelet   Comprehensive metabolic panel   Lipid Panel w/o Chol/HDL Ratio   Microalbumin, Urine Waived   TSH   DVT (deep venous thrombosis) (HCC)    Continue to follow with hematology. Stable. Continue to monitor.       Relevant Medications   amLODipine (NORVASC) 2.5 MG tablet   lisinopril (ZESTRIL) 30 MG tablet   metoprolol succinate (TOPROL-XL) 25 MG 24 hr tablet   simvastatin (ZOCOR) 40 MG tablet   Pulmonary embolism (HCC)    Continue to follow with hematology. Stable. Continue to monitor.       Relevant Medications   amLODipine (NORVASC) 2.5 MG tablet   lisinopril (ZESTRIL) 30 MG tablet   metoprolol succinate (TOPROL-XL) 25 MG 24 hr tablet   simvastatin (ZOCOR) 40 MG tablet   Atrial fibrillation (HCC)    Stable. Continue to monitor. Call with any concerns. Refills given today.      Relevant Medications   amLODipine (NORVASC) 2.5 MG tablet   lisinopril (ZESTRIL) 30 MG tablet   metoprolol succinate (TOPROL-XL) 25 MG 24 hr tablet   simvastatin (ZOCOR) 40 MG tablet     Nervous and Auditory   Parkinson's disease ([place])    Continue to follow with neurology. Stable. Continue to monitor.       Relevant Orders   CBC with Differential/Platelet   Comprehensive metabolic panel   Lipid Panel w/o Chol/HDL Ratio   TSH     Genitourinary   BPH (benign prostatic hyperplasia)    Under good control on current regimen. Continue current regimen. Continue to  monitor. Call with any concerns. Refills given. Labs drawn today.       Relevant Medications   tamsulosin (FLOMAX) 0.4 MG CAPS capsule   Other Relevant Orders   CBC with Differential/Platelet   Comprehensive metabolic panel   Lipid Panel w/o Chol/HDL Ratio   PSA   TSH   UA/M w/rflx Culture, Routine     Hematopoietic and Hemostatic   Activated protein C resistance (HCC)    Continue to follow with hematology. Stable. Continue to monitor.         Other   Hypercholesterolemia    Under good control on current regimen. Continue current regimen. Continue to monitor. Call with any concerns. Refills given. Labs drawn today.       Relevant Medications   amLODipine (NORVASC) 2.5 MG tablet   lisinopril (ZESTRIL) 30 MG tablet   metoprolol succinate (TOPROL-XL)  25 MG 24 hr tablet   simvastatin (ZOCOR) 40 MG tablet   Other Relevant Orders   CBC with Differential/Platelet   Comprehensive metabolic panel   Lipid Panel w/o Chol/HDL Ratio   TSH   Insomnia due to medical condition    Under good control on current regimen. Continue current regimen. Continue to monitor. Call with any concerns. Refills given.        Relevant Orders   CBC with Differential/Platelet   Comprehensive metabolic panel   Lipid Panel w/o Chol/HDL Ratio   TSH    Other Visit Diagnoses    Routine general medical examination at a health care facility    -  Primary   Vaccines up to date. Screening labs checked today. Continue diet and exercise. Needs colonoscopy. Referral generated today. Continue to monitor.    Positive colorectal cancer screening using Cologuard test       Referral to GI made today for colonoscopy. Await results.    Relevant Orders   Ambulatory referral to Gastroenterology   Screening for colon cancer       Referral to GI made today for colonoscopy. Await results.    Relevant Orders   Ambulatory referral to Gastroenterology       Discussed aspirin prophylaxis for myocardial infarction  prevention and decision was it was not indicated  LABORATORY TESTING:  Health maintenance labs ordered today as discussed above.   The natural history of prostate cancer and ongoing controversy regarding screening and potential treatment outcomes of prostate cancer has been discussed with the patient. The meaning of a false positive PSA and a false negative PSA has been discussed. He indicates understanding of the limitations of this screening test and wishes to proceed with screening PSA testing.   IMMUNIZATIONS:   - Tdap: Tetanus vaccination status reviewed: last tetanus booster within 10 years. - Influenza: Up to date - Pneumovax: Up to date - Prevnar: Up to date  SCREENING: - Colonoscopy: Ordered today  Discussed with patient purpose of the colonoscopy is to detect colon cancer at curable precancerous or early stages    PATIENT COUNSELING:    Sexuality: Discussed sexually transmitted diseases, partner selection, use of condoms, avoidance of unintended pregnancy  and contraceptive alternatives.   Advised to avoid cigarette smoking.  I discussed with the patient that most people either abstain from alcohol or drink within safe limits (<=14/week and <=4 drinks/occasion for males, <=7/weeks and <= 3 drinks/occasion for females) and that the risk for alcohol disorders and other health effects rises proportionally with the number of drinks per week and how often a drinker exceeds daily limits.  Discussed cessation/primary prevention of drug use and availability of treatment for abuse.   Diet: Encouraged to adjust caloric intake to maintain  or achieve ideal body weight, to reduce intake of dietary saturated fat and total fat, to limit sodium intake by avoiding high sodium foods and not adding table salt, and to maintain adequate dietary potassium and calcium preferably from fresh fruits, vegetables, and low-fat dairy products.    stressed the importance of regular exercise  Injury  prevention: Discussed safety belts, safety helmets, smoke detector, smoking near bedding or upholstery.   Dental health: Discussed importance of regular tooth brushing, flossing, and dental visits.   Follow up plan: NEXT PREVENTATIVE PHYSICAL DUE IN 1 YEAR. Return in about 6 months (around 12/29/2019).

## 2019-06-28 NOTE — Telephone Encounter (Signed)
Discussed today at his appointment.

## 2019-06-28 NOTE — Assessment & Plan Note (Signed)
Continue to follow with hematology. Stable. Continue to monitor.

## 2019-06-28 NOTE — Assessment & Plan Note (Signed)
Stable. Continue to monitor. Call with any concerns. Refills given today.

## 2019-06-28 NOTE — Assessment & Plan Note (Signed)
Under good control on current regimen. Continue current regimen. Continue to monitor. Call with any concerns. Refills given. Labs drawn today.   

## 2019-06-28 NOTE — Assessment & Plan Note (Signed)
Under good control on current regimen. Continue current regimen. Continue to monitor. Call with any concerns. Refills given.   

## 2019-06-28 NOTE — Assessment & Plan Note (Signed)
Continue to follow with neurology. Stable. Continue to monitor.  

## 2019-06-28 NOTE — Patient Instructions (Signed)

## 2019-06-28 NOTE — Assessment & Plan Note (Signed)
Continues to be very labile. Stressed hydration. Continue current regimen. Continue to monitor. Call with any concerns. Refills given today.

## 2019-06-29 LAB — TSH: TSH: 1.64 u[IU]/mL (ref 0.450–4.500)

## 2019-06-29 LAB — COMPREHENSIVE METABOLIC PANEL
ALT: 8 IU/L (ref 0–44)
AST: 15 IU/L (ref 0–40)
Albumin/Globulin Ratio: 1.7 (ref 1.2–2.2)
Albumin: 4.4 g/dL (ref 3.8–4.8)
Alkaline Phosphatase: 89 IU/L (ref 39–117)
BUN/Creatinine Ratio: 15 (ref 10–24)
BUN: 18 mg/dL (ref 8–27)
Bilirubin Total: 0.6 mg/dL (ref 0.0–1.2)
CO2: 21 mmol/L (ref 20–29)
Calcium: 9.3 mg/dL (ref 8.6–10.2)
Chloride: 105 mmol/L (ref 96–106)
Creatinine, Ser: 1.2 mg/dL (ref 0.76–1.27)
GFR calc Af Amer: 70 mL/min/{1.73_m2} (ref >59)
GFR calc non Af Amer: 61 mL/min/{1.73_m2} (ref >59)
Globulin, Total: 2.6 g/dL (ref 1.5–4.5)
Glucose: 96 mg/dL (ref 65–99)
Potassium: 4.8 mmol/L (ref 3.5–5.2)
Sodium: 140 mmol/L (ref 134–144)
Total Protein: 7 g/dL (ref 6.0–8.5)

## 2019-06-29 LAB — CBC WITH DIFFERENTIAL/PLATELET
Basophils Absolute: 0 10*3/uL (ref 0.0–0.2)
Basos: 1 %
EOS (ABSOLUTE): 0.1 10*3/uL (ref 0.0–0.4)
Eos: 2 %
Hematocrit: 44.7 % (ref 37.5–51.0)
Hemoglobin: 14.9 g/dL (ref 13.0–17.7)
Immature Grans (Abs): 0 10*3/uL (ref 0.0–0.1)
Immature Granulocytes: 0 %
Lymphocytes Absolute: 0.8 10*3/uL (ref 0.7–3.1)
Lymphs: 13 %
MCH: 30.8 pg (ref 26.6–33.0)
MCHC: 33.3 g/dL (ref 31.5–35.7)
MCV: 92 fL (ref 79–97)
Monocytes Absolute: 0.5 10*3/uL (ref 0.1–0.9)
Monocytes: 8 %
Neutrophils Absolute: 4.6 10*3/uL (ref 1.4–7.0)
Neutrophils: 76 %
Platelets: 152 10*3/uL (ref 150–450)
RBC: 4.84 x10E6/uL (ref 4.14–5.80)
RDW: 12 % (ref 11.6–15.4)
WBC: 6 10*3/uL (ref 3.4–10.8)

## 2019-06-29 LAB — LIPID PANEL W/O CHOL/HDL RATIO
Cholesterol, Total: 132 mg/dL (ref 100–199)
HDL: 50 mg/dL (ref >39)
LDL Chol Calc (NIH): 66 mg/dL (ref 0–99)
Triglycerides: 82 mg/dL (ref 0–149)
VLDL Cholesterol Cal: 16 mg/dL (ref 5–40)

## 2019-06-29 LAB — PSA: Prostate Specific Ag, Serum: 1.9 ng/mL (ref 0.0–4.0)

## 2019-06-29 NOTE — Progress Notes (Signed)
Assessment/Plan:   1.  Parkinsons Disease, with family history of such  -Continue ropinirole, 2 mg 3 times per day.  No episodes of compulsive behaviors or sleep attacks  -Continue carbidopa/levodopa 25/100, 2 tablets at 6 AM/10 AM/2 PM/6 PM.  Can take extra 1/2 tablet-1 tablet prn  -Continue carbidopa/levodopa 50/200 CR at bedtime for cramping.  This has helped.  -On amantadine, 100 mg 3 times per day for dyskinesia  -discussed lift chair.  Doesn't need that yet  2.  REM behavior disorder  -This is commonly associated with PD and the patient is experiencing this.  We discussed that this can be very serious and even harmful.  We talked about medications as well as physical barriers to put in the bed (particularly soft bed rails, pillow barriers).  We talked about moving the night stand so that it is not so close to the side of the bed.  Patient declines medication right now.  3.  Atrial fibrillation  -Tremor worse in the past when on amiodarone.  Has been off of that for a long time.  Has been in sinus rhythm for a long time.  Follows with cardiology  4.  Memory change  -Last neurocognitive testing in August, 2020.  No evidence of dementia.  Only mild cognitive impairment.   Subjective:   Thomas [name] was seen today in follow up for Parkinsons disease.  My previous records were reviewed prior to todays visit as well as outside records available to me, including his primary care visit from March 16. Pt states that he is a little more sluggish in the AM but if he "fights it" he gets moving pretty well.  Pt denies falls.  Pt denies lightheadedness, near syncope.  No hallucinations.  Mood has been good.  Doing RSB in person.  Some occasional trouble in the evening getting up from seated position.    Current prescribed movement disorder medications: Ropinirole, 2 mg 3 times per day Carbidopa/levodopa 25/100, 2 tablets at 6 AM/2 tablets at 10 AM/2 tablets at 2 PM/2 tablets at 6  PM Amantadine 100 mg 3 times per day carbidopa/levodopa 50/200 CR q hs   PREVIOUS MEDICATIONS: kynmobi (had approval but held on starting b/c did better with q hs CR levodopa); azilect  ALLERGIES:   Allergies  Allergen Reactions  . Codeine Other (See Comments)    Reaction: Hallucinations    CURRENT MEDICATIONS:  Outpatient Encounter Medications as of 07/01/2019  Medication Sig  . acetaminophen (TYLENOL) 325 MG tablet Take 325 mg by mouth every 6 (six) hours as needed.  Marland Kitchen amantadine (SYMMETREL) 100 MG capsule TAKE 1 CAPSULE THREE TIMES DAILY  . amLODipine (NORVASC) 2.5 MG tablet Take 0.5 tablets (1.25 mg total) by mouth daily.  . carbidopa-levodopa (SINEMET CR) 50-200 MG tablet Take 1 tablet by mouth at bedtime.  . carbidopa-levodopa (SINEMET IR) 25-100 MG tablet TAKE 2 TABLETS AT 6AM, 10AM, AND 2PM, AND TAKE 1 TABLET AT 6PM AS DIRECTED.  Marland Kitchen cetirizine (ZYRTEC) 10 MG tablet Take 10 mg by mouth daily.  . diclofenac Sodium (VOLTAREN) 1 % GEL APPLY TOPICALLY 4 GRAMS FOUR TIMES DAILY  . lisinopril (ZESTRIL) 30 MG tablet Take 1 tablet (30 mg total) by mouth daily.  . Melatonin 2.5 MG CAPS Take 1 capsule by mouth at bedtime as needed (for sleep).   . metoprolol succinate (TOPROL-XL) 25 MG 24 hr tablet Take 1 tablet (25 mg total) by mouth daily.  . Multiple Vitamin (MULTIVITAMIN) tablet Take 1 tablet  by mouth daily.  Marland Kitchen PREVIDENT 5000 BOOSTER PLUS 1.1 % PSTE See admin instructions.  . rivaroxaban (XARELTO) 20 MG TABS tablet Take 1 tablet (20 mg total) by mouth daily.  Marland Kitchen rOPINIRole (REQUIP) 2 MG tablet TAKE 1 TABLET THREE TIMES DAILY  . sildenafil (REVATIO) 20 MG tablet Take 1 tablet (20 mg total) by mouth 3 (three) times daily as needed.  . simvastatin (ZOCOR) 40 MG tablet Take 1 tablet (40 mg total) by mouth daily.  . tamsulosin (FLOMAX) 0.4 MG CAPS capsule Take 1 capsule (0.4 mg total) by mouth daily.  . traZODone (DESYREL) 50 MG tablet Take 0.5-1 tablets (25-50 mg total) by mouth at bedtime  as needed for sleep.   No facility-administered encounter medications on file as of 07/01/2019.    Objective:   PHYSICAL EXAMINATION:    VITALS:   Vitals:   07/01/19 0910  BP: 128/88  Pulse: (!) 56  SpO2: 99%  Weight: 247 lb (112 kg)  Height: 5\' 10"  (1.778 m)    GEN:  The patient appears stated age and is in NAD. HEENT:  Normocephalic, atraumatic.  The mucous membranes are moist. The superficial temporal arteries are without ropiness or tenderness. CV:  RRR Lungs:  CTAB Neck/HEME:  There are no carotid bruits bilaterally.  Neurological examination:  Orientation: The patient is alert and oriented x3. Cranial nerves: There is good facial symmetry with min facial hypomimia. The speech is fluent and clear. Soft palate rises symmetrically and there is no tongue deviation. Hearing is intact to conversational tone. Sensation: Sensation is intact to light touch throughout Motor: Strength is at least antigravity x4.  Movement examination: Tone: There is normal tone in the ue/le Abnormal movements: none Coordination:  There is mild decremation with RAM's, with hand opening and closing and toe taps bilaterally Gait and Station: The patient has no difficulty arising out of a deep-seated chair without the use of the hands. The patient's stride length is good.    I have reviewed and interpreted the following labs independently     Chemistry      Component Value Date/Time   NA 140 06/28/2019 1048   K 4.8 06/28/2019 1048   CL 105 06/28/2019 1048   CO2 21 06/28/2019 1048   BUN 18 06/28/2019 1048   CREATININE 1.20 06/28/2019 1048      Component Value Date/Time   CALCIUM 9.3 06/28/2019 1048   ALKPHOS 89 06/28/2019 1048   AST 15 06/28/2019 1048   ALT 8 06/28/2019 1048   BILITOT 0.6 06/28/2019 1048     Lab Results  Component Value Date   TSH 1.640 06/28/2019      Total time spent on today's visit was 30 minutes, including both face-to-face time and nonface-to-face time.   Time included that spent on review of records (prior notes available to me/labs/imaging if pertinent), discussing treatment and goals, answering patient's questions and coordinating care.  Cc:  [name], DO

## 2019-06-30 ENCOUNTER — Encounter: Payer: Self-pay | Admitting: Family Medicine

## 2019-06-30 ENCOUNTER — Telehealth: Payer: Self-pay | Admitting: Family Medicine

## 2019-06-30 NOTE — Telephone Encounter (Signed)
Copied from [place] [id number]. Topic: General - Other >> Jun 30, 2019 12:12 PM Keene Breath wrote: Reason for CRM: Patient's wife called to get the address and info on where the patient is getting his colonoscopy.  She said she could not hear it well on the answering machine when they called.  CB# [phone]

## 2019-07-01 ENCOUNTER — Ambulatory Visit: Payer: Medicare HMO | Admitting: Neurology

## 2019-07-01 ENCOUNTER — Encounter: Payer: Self-pay | Admitting: Neurology

## 2019-07-01 ENCOUNTER — Other Ambulatory Visit: Payer: Self-pay

## 2019-07-01 VITALS — BP 128/88 | HR 56 | Ht 70.0 in | Wt 247.0 lb

## 2019-07-01 DIAGNOSIS — G20 Parkinson's disease: Secondary | ICD-10-CM | POA: Diagnosis not present

## 2019-07-01 NOTE — Patient Instructions (Signed)
You can take an extra 1/2-1 carbidopa/levodopa 25/100 as needed for a bad day.  The physicians and staff at [facility] are committed to providing excellent care. You may receive a survey requesting feedback about your experience at our office. We strive to receive "very good" responses to the survey questions. If you feel that your experience would prevent you from giving the office a "very good " response, please contact our office to try to remedy the situation. We may be reached at [phone]. Thank you for taking the time out of your busy day to complete the survey.

## 2019-07-04 ENCOUNTER — Other Ambulatory Visit: Payer: Self-pay

## 2019-07-04 ENCOUNTER — Ambulatory Visit (INDEPENDENT_AMBULATORY_CARE_PROVIDER_SITE_OTHER): Payer: Self-pay | Admitting: Gastroenterology

## 2019-07-04 DIAGNOSIS — R195 Other fecal abnormalities: Secondary | ICD-10-CM

## 2019-07-04 MED ORDER — NA SULFATE-K SULFATE-MG SULF 17.5-3.13-1.6 GM/177ML PO SOLN: 1.0000 | Freq: Once | ORAL | 0 refills | Status: AC

## 2019-07-04 NOTE — Progress Notes (Signed)
Gastroenterology Pre-Procedure Review  Request Date: Tuesday 07/26/19 Requesting Physician: Dr. Allen Norris  PATIENT REVIEW QUESTIONS: The patient responded to the following health history questions as indicated:    1. Are you having any GI issues? yes (Constipation-pt and wife associates it with having Parkinson) 2. Do you have a personal history of Polyps? no 3. Do you have a family history of Colon Cancer or Polyps? no 4. Diabetes Mellitus? no 5. Joint replacements in the past 12 months?no 6. Major health problems in the past 3 months?no 7. Any artificial heart valves, MVP, or defibrillator?no  8. Pulmonary:5-6 years ago lung nodules   MEDICATIONS & ALLERGIES:    Patient reports the following regarding taking any anticoagulation/antiplatelet therapy:   Plavix, Coumadin, Eliquis, Xarelto, Lovenox, Pradaxa, Brilinta, or Effient? yes (Xarelto Blood Thinner Request sent to [facility]) Aspirin? no  Patient confirms/reports the following medications:  Current Outpatient Medications  Medication Sig Dispense Refill  . acetaminophen (TYLENOL) 325 MG tablet Take 325 mg by mouth every 6 (six) hours as needed.    Marland Kitchen amantadine (SYMMETREL) 100 MG capsule TAKE 1 CAPSULE THREE TIMES DAILY 270 capsule 1  . amLODipine (NORVASC) 2.5 MG tablet Take 0.5 tablets (1.25 mg total) by mouth daily. 45 tablet 1  . carbidopa-levodopa (SINEMET CR) 50-200 MG tablet Take 1 tablet by mouth at bedtime. 90 tablet 1  . carbidopa-levodopa (SINEMET IR) 25-100 MG tablet TAKE 2 TABLETS AT 6AM, 10AM, AND 2PM, AND TAKE 1 TABLET AT 6PM AS DIRECTED. 630 tablet 1  . cetirizine (ZYRTEC) 10 MG tablet Take 10 mg by mouth daily.    . cholecalciferol (VITAMIN D3) 25 MCG (1000 UNIT) tablet Take 1,000 Units by mouth daily.    . diclofenac Sodium (VOLTAREN) 1 % GEL APPLY TOPICALLY 4 GRAMS FOUR TIMES DAILY 400 g 0  . lisinopril (ZESTRIL) 30 MG tablet Take 1 tablet (30 mg total) by mouth daily. 90 tablet 0  . Melatonin 2.5 MG CAPS Take 1  capsule by mouth at bedtime as needed (for sleep).     . metoprolol succinate (TOPROL-XL) 25 MG 24 hr tablet Take 1 tablet (25 mg total) by mouth daily. 90 tablet 0  . Multiple Vitamin (MULTIVITAMIN) tablet Take 1 tablet by mouth daily.    Marland Kitchen PREVIDENT 5000 BOOSTER PLUS 1.1 % PSTE See admin instructions.    . rivaroxaban (XARELTO) 20 MG TABS tablet Take 1 tablet (20 mg total) by mouth daily. 90 tablet 3  . rOPINIRole (REQUIP) 2 MG tablet TAKE 1 TABLET THREE TIMES DAILY 270 tablet 1  . sildenafil (REVATIO) 20 MG tablet Take 1 tablet (20 mg total) by mouth 3 (three) times daily as needed. 90 tablet 6  . simvastatin (ZOCOR) 40 MG tablet Take 1 tablet (40 mg total) by mouth daily. 90 tablet 1  . tamsulosin (FLOMAX) 0.4 MG CAPS capsule Take 1 capsule (0.4 mg total) by mouth daily. 90 capsule 1  . traZODone (DESYREL) 50 MG tablet Take 0.5-1 tablets (25-50 mg total) by mouth at bedtime as needed for sleep. 90 tablet 1  . Na Sulfate-K Sulfate-Mg Sulf 17.5-3.13-1.6 GM/177ML SOLN Take 1 kit by mouth once for 1 dose. 354 mL 0   No current facility-administered medications for this visit.    Patient confirms/reports the following allergies:  Allergies  Allergen Reactions  . Codeine Other (See Comments)    Reaction: Hallucinations    No orders of the defined types were placed in this encounter.   AUTHORIZATION INFORMATION Primary Insurance: 1D#: Group #:  Secondary Insurance: 1D#: Group #:  SCHEDULE INFORMATION: Date: Tuesday 04/13/21Time: Location:[facility]

## 2019-07-05 NOTE — Telephone Encounter (Signed)
Lvm for pt to call back. Pt also needs to schedule 6 mo appt.

## 2019-07-06 ENCOUNTER — Telehealth: Payer: Self-pay | Admitting: Neurology

## 2019-07-06 ENCOUNTER — Ambulatory Visit (INDEPENDENT_AMBULATORY_CARE_PROVIDER_SITE_OTHER): Payer: Medicare HMO | Admitting: General Practice

## 2019-07-06 ENCOUNTER — Telehealth: Payer: Self-pay | Admitting: General Practice

## 2019-07-06 DIAGNOSIS — G20 Parkinson's disease: Secondary | ICD-10-CM

## 2019-07-06 DIAGNOSIS — E7800 Pure hypercholesterolemia, unspecified: Secondary | ICD-10-CM | POA: Diagnosis not present

## 2019-07-06 DIAGNOSIS — I10 Essential (primary) hypertension: Secondary | ICD-10-CM | POA: Diagnosis not present

## 2019-07-06 NOTE — Telephone Encounter (Signed)
I don't recall that.  I do recall him showing me the lower legs and what looked like venous stasis/insufficiency but don't know of any cream for that.  Its not a Parkinsons Disease issue so will need to f/u with PCP regarding that

## 2019-07-06 NOTE — Telephone Encounter (Signed)
Dr. Carles Collet told patient to use a cream for legs breaking out. Need to know the name of the cream.

## 2019-07-06 NOTE — Chronic Care Management (AMB) (Signed)
Chronic Care Management   Follow Up Note   07/06/2019 Name: Taki Currier MRN: [id number] DOB: 14-Dec-1950  Referred by: [name], DO Reason for referral : Chronic Care Management (Follow up: HTN/HLD/Parkinsons)   Paublo Zimmerle is a 69 y.o. year old male who is a primary care patient of [name], DO. The CCM team was consulted for assistance with chronic disease management and care coordination needs.    Review of patient status, including review of consultants reports, relevant laboratory and other test results, and collaboration with appropriate care team members and the patient's provider was performed as part of comprehensive patient evaluation and provision of chronic care management services.    SDOH (Social Determinants of Health) assessments performed: No See Care Plan activities for detailed interventions related to [facility])     Outpatient Encounter Medications as of 07/06/2019  Medication Sig  . acetaminophen (TYLENOL) 325 MG tablet Take 325 mg by mouth every 6 (six) hours as needed.  Marland Kitchen amantadine (SYMMETREL) 100 MG capsule TAKE 1 CAPSULE THREE TIMES DAILY  . amLODipine (NORVASC) 2.5 MG tablet Take 0.5 tablets (1.25 mg total) by mouth daily.  . carbidopa-levodopa (SINEMET CR) 50-200 MG tablet Take 1 tablet by mouth at bedtime.  . carbidopa-levodopa (SINEMET IR) 25-100 MG tablet TAKE 2 TABLETS AT 6AM, 10AM, AND 2PM, AND TAKE 1 TABLET AT 6PM AS DIRECTED.  Marland Kitchen cetirizine (ZYRTEC) 10 MG tablet Take 10 mg by mouth daily.  . cholecalciferol (VITAMIN D3) 25 MCG (1000 UNIT) tablet Take 1,000 Units by mouth daily.  . diclofenac Sodium (VOLTAREN) 1 % GEL APPLY TOPICALLY 4 GRAMS FOUR TIMES DAILY  . lisinopril (ZESTRIL) 30 MG tablet Take 1 tablet (30 mg total) by mouth daily.  . Melatonin 2.5 MG CAPS Take 1 capsule by mouth at bedtime as needed (for sleep).   . metoprolol succinate (TOPROL-XL) 25 MG 24 hr tablet Take 1 tablet (25 mg total) by mouth daily.  . Multiple Vitamin  (MULTIVITAMIN) tablet Take 1 tablet by mouth daily.  Marland Kitchen PREVIDENT 5000 BOOSTER PLUS 1.1 % PSTE See admin instructions.  . rivaroxaban (XARELTO) 20 MG TABS tablet Take 1 tablet (20 mg total) by mouth daily.  Marland Kitchen rOPINIRole (REQUIP) 2 MG tablet TAKE 1 TABLET THREE TIMES DAILY  . sildenafil (REVATIO) 20 MG tablet Take 1 tablet (20 mg total) by mouth 3 (three) times daily as needed.  . simvastatin (ZOCOR) 40 MG tablet Take 1 tablet (40 mg total) by mouth daily.  . tamsulosin (FLOMAX) 0.4 MG CAPS capsule Take 1 capsule (0.4 mg total) by mouth daily.  . traZODone (DESYREL) 50 MG tablet Take 0.5-1 tablets (25-50 mg total) by mouth at bedtime as needed for sleep.   No facility-administered encounter medications on file as of 07/06/2019.     Objective:   Goals Addressed            This Visit's Progress   . RN- I want to stay independent (pt-stated)       Current Barriers:  . Chronic Disease Management support and education needs related to Parkinson's Disease  Nurse Case Manager Clinical Goal(s):  Marland Kitchen Over the next 120 days, patient will work with [facility]  to address needs related to remaining independent   Interventions:  . Discussed plans with patient for ongoing care management follow up and provided patient with direct contact information for care management team . Evaluated the use of the bed rail.  The patient states this is very helpful for him and he  is glad he has this to use- completed  . Assessed new safety concerns with the patient.  The patient uses a "hemi-walker" in the am for about 30 minutes before attempting to ambulate without assistance.  The patient feels safe in his home environment.  The patient verbalized he has had not falls. The patient is having some difficulty with getting up and down when sitting and moves to the edge of the chair. The patient is doing well with this process. Inquired about a lift chair and the patient does not want to get a lift chair until he can no longer  independently get up and down out of the chair. Has worked with PT.  Marland Kitchen Assessed activity level and the patient is going to "[employer] and "[employer]". The patient states he feels much better once he has had a work out. The patient praised for positive healthy habits.   Patient Self Care Activities:  . Attends all scheduled provider appointments . Performs ADL's independently . Patient wanting to stay independent as long as possible  Please see past updates related to this goal by clicking on the "Past Updates" button in the selected goal      . RNCM: I am taking my blood pressure 2 times a day.  I have a new pill I am taking for my blood pressure       Current Barriers:  . Chronic Disease Management support and education needs related to HTN and HLD   Nurse Case Manager Clinical Goal(s):  Marland Kitchen Over the next 120 days, patient will verbalize understanding of plan for HTN and HLD . Over the next 120 days, patient will attend all scheduled medical appointments: next appointment scheduled colonoscopy on 07-26-2019 . Over the next 120 days, patient will demonstrate improved adherence to prescribed treatment plan for HTN and HLD as evidenced by continuing to monitor blood pressure BID, and adherence to a low sodium, heart healthy diet . Over the next 120 days, patient will demonstrate improved health management independence as evidenced by continuing to independently maintain health and well being with the assistance of his spouse when needed . Over the next 90 days, patient will work with CM team pharmacist to effectively manage medications and monitor for any issues such as orthostatic hypotension related to the patient taking amlodipine 2.5mg    Interventions:  . Evaluation of current treatment plan related to HTN and HLD and patient's adherence to plan as established by provider. . Provided education to patient re: orthostatic hypotension, changing position slowly and safety concerns in the patient  with multiple chronic disease processes . Discussed plans with patient for ongoing care management follow up and provided patient with direct contact information for care management team . Advised patient, providing education and rationale, to monitor blood pressure daily and record, calling pcp for findings outside established parameters.  . Reviewed scheduled/upcoming provider appointments including: CCM team and scheduled colonoscopy on 07/26/2019. Patient states that there was an abnormal "colon guard" result so he is following up with a colonoscopy.  . The patient verbalized that he is still having issues with blood pressure fluctuations. He denies dizziness or light headiness when his pressure drops in the [id number] or [id number] Systolic. The patient reports his pressure this am was 116/70.  He is keeping a good record of his blood pressure readings and has parameters to follow.   . Pharmacy referral for medication management and follow up for any new concerns related to addition of amlodipine to the  current treatment regimen. Ongoing support and help with medication management  . Assessed patients activity level. He is going to [employer] and [employer] and says he feels better once he has had a work out session.   Patient Self Care Activities:  . Patient verbalizes understanding of plan to manage HTN and HLD . Attends all scheduled provider appointments . Calls provider office for new concerns or questions . Unable to independently manage chronic conditions of HTN and HLD  Please see past updates related to this goal by clicking on the "Past Updates" button in the selected goal          Plan:   Telephone follow up appointment with care management team member scheduled for: 08-30-2019 at [place], MSN, [facility] Mobile: [phone]

## 2019-07-06 NOTE — Patient Instructions (Signed)
Visit Information  Goals Addressed            This Visit's Progress   . RN- I want to stay independent (pt-stated)       Current Barriers:  . Chronic Disease Management support and education needs related to Parkinson's Disease  Nurse Case Manager Clinical Goal(s):  Marland Kitchen Over the next 120 days, patient will work with [facility]  to address needs related to remaining independent   Interventions:  . Discussed plans with patient for ongoing care management follow up and provided patient with direct contact information for care management team . Evaluated the use of the bed rail.  The patient states this is very helpful for him and he is glad he has this to use- completed  . Assessed new safety concerns with the patient.  The patient uses a "hemi-walker" in the am for about 30 minutes before attempting to ambulate without assistance.  The patient feels safe in his home environment.  The patient verbalized he has had not falls. The patient is having some difficulty with getting up and down when sitting and moves to the edge of the chair. The patient is doing well with this process. Inquired about a lift chair and the patient does not want to get a lift chair until he can no longer independently get up and down out of the chair. Has worked with PT.  Marland Kitchen Assessed activity level and the patient is going to "[employer] and "[employer]". The patient states he feels much better once he has had a work out. The patient praised for positive healthy habits.   Patient Self Care Activities:  . Attends all scheduled provider appointments . Performs ADL's independently . Patient wanting to stay independent as long as possible  Please see past updates related to this goal by clicking on the "Past Updates" button in the selected goal      . RNCM: I am taking my blood pressure 2 times a day.  I have a new pill I am taking for my blood pressure       Current Barriers:  . Chronic Disease Management support and education  needs related to HTN and HLD   Nurse Case Manager Clinical Goal(s):  Marland Kitchen Over the next 120 days, patient will verbalize understanding of plan for HTN and HLD . Over the next 120 days, patient will attend all scheduled medical appointments: next appointment scheduled colonoscopy on 07-26-2019 . Over the next 120 days, patient will demonstrate improved adherence to prescribed treatment plan for HTN and HLD as evidenced by continuing to monitor blood pressure BID, and adherence to a low sodium, heart healthy diet . Over the next 120 days, patient will demonstrate improved health management independence as evidenced by continuing to independently maintain health and well being with the assistance of his spouse when needed . Over the next 90 days, patient will work with CM team pharmacist to effectively manage medications and monitor for any issues such as orthostatic hypotension related to the patient taking amlodipine 2.5mg    Interventions:  . Evaluation of current treatment plan related to HTN and HLD and patient's adherence to plan as established by provider. . Provided education to patient re: orthostatic hypotension, changing position slowly and safety concerns in the patient with multiple chronic disease processes . Discussed plans with patient for ongoing care management follow up and provided patient with direct contact information for care management team . Advised patient, providing education and rationale, to monitor blood pressure  daily and record, calling pcp for findings outside established parameters.  . Reviewed scheduled/upcoming provider appointments including: CCM team and scheduled colonoscopy on 07/26/2019. Patient states that there was an abnormal "colon guard" result so he is following up with a colonoscopy.  . The patient verbalized that he is still having issues with blood pressure fluctuations. He denies dizziness or light headiness when his pressure drops in the [id number] or [id number]  Systolic. The patient reports his pressure this am was 116/70.  He is keeping a good record of his blood pressure readings and has parameters to follow.   . Pharmacy referral for medication management and follow up for any new concerns related to addition of amlodipine to the current treatment regimen. Ongoing support and help with medication management  . Assessed patients activity level. He is going to [employer] and [employer] and says he feels better once he has had a work out session.   Patient Self Care Activities:  . Patient verbalizes understanding of plan to manage HTN and HLD . Attends all scheduled provider appointments . Calls provider office for new concerns or questions . Unable to independently manage chronic conditions of HTN and HLD  Please see past updates related to this goal by clicking on the "Past Updates" button in the selected goal         Patient verbalizes understanding of instructions provided today.   Telephone follow up appointment with care management team member scheduled for: 08-30-2019 at [place], MSN, [facility] Mobile: [phone]

## 2019-07-07 NOTE — Telephone Encounter (Signed)
Left message advising patient to contact PCP regarding cream.

## 2019-07-08 ENCOUNTER — Telehealth: Payer: Self-pay | Admitting: Family Medicine

## 2019-07-08 NOTE — Telephone Encounter (Signed)
Routing to PCP

## 2019-07-08 NOTE — Telephone Encounter (Signed)
Pts wife called stating that the pts legs are breaking out with little red spots. Pts wife states that is from pts xarelto. Pts wife is requesting to know the name of the cream to help pt with the spots on his legs that he took a while ago over the counter. Please advise.

## 2019-07-09 NOTE — Telephone Encounter (Signed)
I'm not sure what cream he used. That's probably from being on his feet and using compression hose and vasaline should help.

## 2019-07-11 ENCOUNTER — Telehealth: Payer: Self-pay

## 2019-07-11 NOTE — Telephone Encounter (Signed)
Blood Thinner Advice has been provided to patients wife-asking her to let him know to stop Xarelto 3 days prior to his colonoscopy and restart 2 days after the procedure.  Patients wife said she will inform her husband.  Thanks,  [place], [place]

## 2019-07-11 NOTE — Telephone Encounter (Signed)
Patients wife notified

## 2019-07-13 ENCOUNTER — Other Ambulatory Visit: Payer: Self-pay | Admitting: Neurology

## 2019-07-21 ENCOUNTER — Telehealth: Payer: Self-pay | Admitting: Neurology

## 2019-07-21 ENCOUNTER — Telehealth: Payer: Self-pay | Admitting: Family Medicine

## 2019-07-21 NOTE — Telephone Encounter (Signed)
Copied from [place] [id number]. Topic: General - Other >> Jul 21, 2019  1:29 PM Rainey Pines A wrote: Patients spouse would like a callback today in regards to if Dr. [name] would advise patient to continue taking amantadine (SYMMETREL) 100 MG capsule medication for today due to patient having a colonoscopy and was advised by doctor to not take anything with red dye. Please advise

## 2019-07-21 NOTE — Telephone Encounter (Signed)
Patient's wife notified and verbalized understanding.  

## 2019-07-21 NOTE — Telephone Encounter (Signed)
I would check with his neurologist on that. I'm not actually sure

## 2019-07-21 NOTE — Telephone Encounter (Signed)
Patient's wife called and said her husband is having a colonoscopy on 07/26/19. He needs to know whether to stop his amantadine 100 MG capsule because it is red. He is not supposed to have anything red or purple.  She said she already checked with the PCP and she was told to check with Dr. Carles Collet. Time is limited due to dietary restrictions.

## 2019-07-22 ENCOUNTER — Other Ambulatory Visit
Admission: RE | Admit: 2019-07-22 | Discharge: 2019-07-22 | Disposition: A | Discharge status: Home / Self Care | Payer: Medicare HMO | Source: Ambulatory Visit | Attending: Gastroenterology | Admitting: Gastroenterology

## 2019-07-22 DIAGNOSIS — Z20822 Contact with and (suspected) exposure to covid-19: Secondary | ICD-10-CM | POA: Insufficient documentation

## 2019-07-22 DIAGNOSIS — Z01812 Encounter for preprocedural laboratory examination: Secondary | ICD-10-CM | POA: Insufficient documentation

## 2019-07-22 LAB — SARS CORONAVIRUS 2 (TAT 6-24 HRS): SARS Coronavirus 2: NEGATIVE

## 2019-07-22 NOTE — Telephone Encounter (Signed)
Left message informing patient to contact GI regarding medication, but there is no problem to stop and restart.

## 2019-07-22 NOTE — Telephone Encounter (Signed)
The best people to ask is the GI physician.  I don't think it will interfere with anything but there is no problem to stop it a few days before either and then restart it after.

## 2019-07-26 ENCOUNTER — Telehealth: Payer: Self-pay | Admitting: Neurology

## 2019-07-26 ENCOUNTER — Encounter: Payer: Self-pay | Admitting: Gastroenterology

## 2019-07-26 NOTE — Telephone Encounter (Signed)
Left message to contact office with more information.

## 2019-07-26 NOTE — Telephone Encounter (Signed)
No answer at 1050

## 2019-07-26 NOTE — Telephone Encounter (Signed)
Please call patient's spouse about his amantadine capsules. They are red and causing him problems. Patient has a colonoscopy today.

## 2019-07-27 ENCOUNTER — Encounter: Payer: Self-pay | Admitting: Gastroenterology

## 2019-07-27 ENCOUNTER — Ambulatory Visit
Admission: RE | Admit: 2019-07-27 | Discharge: 2019-07-27 | Disposition: A | Discharge status: Home / Self Care | Payer: Medicare HMO | Attending: Gastroenterology | Admitting: Gastroenterology

## 2019-07-27 ENCOUNTER — Ambulatory Visit: Payer: Medicare HMO | Admitting: Anesthesiology

## 2019-07-27 ENCOUNTER — Encounter
Admission: RE | Disposition: A | Discharge status: Home / Self Care | Payer: Self-pay | Source: Home / Self Care | Attending: Gastroenterology

## 2019-07-27 ENCOUNTER — Other Ambulatory Visit: Payer: Self-pay

## 2019-07-27 DIAGNOSIS — Z86711 Personal history of pulmonary embolism: Secondary | ICD-10-CM | POA: Insufficient documentation

## 2019-07-27 DIAGNOSIS — Z8673 Personal history of transient ischemic attack (TIA), and cerebral infarction without residual deficits: Secondary | ICD-10-CM | POA: Insufficient documentation

## 2019-07-27 DIAGNOSIS — Z885 Allergy status to narcotic agent status: Secondary | ICD-10-CM | POA: Diagnosis not present

## 2019-07-27 DIAGNOSIS — I10 Essential (primary) hypertension: Secondary | ICD-10-CM | POA: Diagnosis not present

## 2019-07-27 DIAGNOSIS — M1710 Unilateral primary osteoarthritis, unspecified knee: Secondary | ICD-10-CM | POA: Diagnosis not present

## 2019-07-27 DIAGNOSIS — Z791 Long term (current) use of non-steroidal anti-inflammatories (NSAID): Secondary | ICD-10-CM | POA: Diagnosis not present

## 2019-07-27 DIAGNOSIS — E7800 Pure hypercholesterolemia, unspecified: Secondary | ICD-10-CM | POA: Diagnosis not present

## 2019-07-27 DIAGNOSIS — K635 Polyp of colon: Secondary | ICD-10-CM | POA: Diagnosis not present

## 2019-07-27 DIAGNOSIS — J302 Other seasonal allergic rhinitis: Secondary | ICD-10-CM | POA: Diagnosis not present

## 2019-07-27 DIAGNOSIS — I4891 Unspecified atrial fibrillation: Secondary | ICD-10-CM | POA: Insufficient documentation

## 2019-07-27 DIAGNOSIS — Z7901 Long term (current) use of anticoagulants: Secondary | ICD-10-CM | POA: Insufficient documentation

## 2019-07-27 DIAGNOSIS — Z79899 Other long term (current) drug therapy: Secondary | ICD-10-CM | POA: Diagnosis not present

## 2019-07-27 DIAGNOSIS — Q438 Other specified congenital malformations of intestine: Secondary | ICD-10-CM | POA: Insufficient documentation

## 2019-07-27 DIAGNOSIS — R195 Other fecal abnormalities: Secondary | ICD-10-CM | POA: Diagnosis not present

## 2019-07-27 DIAGNOSIS — G20 Parkinson's disease: Secondary | ICD-10-CM | POA: Diagnosis not present

## 2019-07-27 DIAGNOSIS — Z86718 Personal history of other venous thrombosis and embolism: Secondary | ICD-10-CM | POA: Diagnosis not present

## 2019-07-27 HISTORY — DX: Unspecified atrial fibrillation: I48.91

## 2019-07-27 HISTORY — PX: COLONOSCOPY WITH PROPOFOL: SHX5780

## 2019-07-27 SURGERY — COLONOSCOPY WITH PROPOFOL
Anesthesia: General

## 2019-07-27 MED ORDER — PROPOFOL 500 MG/50ML IV EMUL
INTRAVENOUS | Status: AC
  Filled 2019-07-27: qty 50

## 2019-07-27 MED ORDER — PROPOFOL 500 MG/50ML IV EMUL
INTRAVENOUS | Status: DC | PRN
  Administered 2019-07-27: 150 ug/kg/min via INTRAVENOUS

## 2019-07-27 MED ORDER — FENTANYL CITRATE (PF) 100 MCG/2ML IJ SOLN
INTRAMUSCULAR | Status: AC
  Filled 2019-07-27: qty 2

## 2019-07-27 MED ORDER — EPHEDRINE 5 MG/ML INJ
INTRAVENOUS | Status: AC
  Filled 2019-07-27: qty 10

## 2019-07-27 MED ORDER — PROPOFOL 10 MG/ML IV BOLUS
INTRAVENOUS | Status: AC
  Filled 2019-07-27: qty 20

## 2019-07-27 MED ORDER — SODIUM CHLORIDE 0.9 % IV SOLN: INTRAVENOUS | Status: DC

## 2019-07-27 MED ORDER — MIDAZOLAM HCL 2 MG/2ML IJ SOLN
INTRAMUSCULAR | Status: DC | PRN
  Administered 2019-07-27: 2 mg via INTRAVENOUS

## 2019-07-27 MED ORDER — FENTANYL CITRATE (PF) 100 MCG/2ML IJ SOLN
INTRAMUSCULAR | Status: DC | PRN
  Administered 2019-07-27: 50 ug via INTRAVENOUS

## 2019-07-27 MED ORDER — EPHEDRINE SULFATE 50 MG/ML IJ SOLN
INTRAMUSCULAR | Status: DC | PRN
  Administered 2019-07-27: 10 mg via INTRAVENOUS

## 2019-07-27 MED ORDER — MIDAZOLAM HCL 2 MG/2ML IJ SOLN
INTRAMUSCULAR | Status: AC
  Filled 2019-07-27: qty 2

## 2019-07-27 NOTE — Op Note (Signed)
[facility] Patient Name: Thomas [name] Procedure Date: 07/27/2019 12:03 PM MRN: [id number] Account #: [id number] Date of Birth: 03/11/1951 Admit Type: Outpatient Age: 69 Room: [facility] ENDO ROOM 4 Gender: Male Note Status: Finalized Procedure:             Colonoscopy Indications:           Positive Cologuard test Providers:             Lin Landsman MD, MD Medicines:             Monitored Anesthesia Care Complications:         No immediate complications. Estimated blood loss: None. Procedure:             Pre-Anesthesia Assessment:                        - Prior to the procedure, a History and Physical was                         performed, and patient medications and allergies were                         reviewed. The patient is competent. The risks and                         benefits of the procedure and the sedation options and                         risks were discussed with the patient. All questions                         were answered and informed consent was obtained.                         Patient identification and proposed procedure were                         verified by the physician, the nurse, the                         anesthesiologist, the anesthetist and the technician                         in the pre-procedure area in the procedure room in the                         endoscopy suite. Mental Status Examination: alert and                         oriented. Airway Examination: normal oropharyngeal                         airway and neck mobility. Respiratory Examination:                         clear to auscultation. CV Examination: normal.                         Prophylactic Antibiotics: The patient does not require  prophylactic antibiotics. Prior Anticoagulants: The                         patient has taken Xarelto (rivaroxaban), last dose was                         4 days prior to procedure. ASA  Grade Assessment: III -                         A patient with severe systemic disease. After                         reviewing the risks and benefits, the patient was                         deemed in satisfactory condition to undergo the                         procedure. The anesthesia plan was to use monitored                         anesthesia care (MAC). Immediately prior to                         administration of medications, the patient was                         re-assessed for adequacy to receive sedatives. The                         heart rate, respiratory rate, oxygen saturations,                         blood pressure, adequacy of pulmonary ventilation, and                         response to care were monitored throughout the                         procedure. The physical status of the patient was                         re-assessed after the procedure.                        After obtaining informed consent, the colonoscope was                         passed under direct vision. Throughout the procedure,                         the patient's blood pressure, pulse, and oxygen                         saturations were monitored continuously. The                         Colonoscope was introduced through the anus and  advanced to the the cecum, identified by appendiceal                         orifice and ileocecal valve. The colonoscopy was                         extremely difficult due to inadequate bowel prep, a                         redundant colon, significant looping and the patient's                         body habitus. Successful completion of the procedure                         was aided by changing the patient to a supine                         position, changing the patient to a prone position,                         withdrawing the scope and replacing with the                         enteroscope, straightening and shortening the  scope to                         obtain bowel loop reduction and applying abdominal                         pressure. The patient tolerated the procedure fairly                         well. The quality of the bowel preparation was                         evaluated using the [facility] Bowel Preparation                         Scale) with scores of: Right Colon = 2 (minor amount                         of residual staining, small fragments of stool and/or                         opaque liquid, but mucosa seen well), Transverse Colon                         = 2 (minor amount of residual staining, small                         fragments of stool and/or opaque liquid, but mucosa                         seen well) and Left Colon = 1 (portion of mucosa seen,  but other areas not well seen due to staining,                         residual stool and/or opaque liquid). The total BBPS                         score equals 5. The quality of the bowel preparation                         was fair. Findings:      Four sessile polyps were found in the descending colon and transverse       colon. The polyps were 3 to 5 mm in size. These polyps were removed with       a cold snare. Polyp resection was incomplete, and the resected tissue       was partially retrieved.      The retroflexed view of the distal rectum and anal verge was normal and       showed no anal or rectal abnormalities. Impression:            - Preparation of the colon was fair.                        - Four 3 to 5 mm polyps in the descending colon and in                         the transverse colon, removed with a cold snare. Polyp                         resection was incomplete, and the resected tissue was                         partially retrieved.                        - The distal rectum and anal verge are normal on                         retroflexion view. Recommendation:        - Discharge patient to  home (with escort).                        - Resume previous diet today.                        - Continue present medications.                        - Await pathology results.                        - Repeat colonoscopy in 3 years for surveillance based                         on pathology results. Procedure Code(s):     --- Professional ---                        [phone], Colonoscopy, flexible; with removal of  tumor(s), polyp(s), or other lesion(s) by snare                         technique Diagnosis Code(s):     --- Professional ---                        K63.5, Polyp of colon                        R19.5, Other fecal abnormalities CPT copyright 2019 American Medical Association. All rights reserved. The codes documented in this report are preliminary and upon coder review may  be revised to meet current compliance requirements. Dr. Ulyess Mort Lin Landsman MD, MD 07/27/2019 1:28:23 PM This report has been signed electronically. Number of Addenda: 0 Note Initiated On: 07/27/2019 12:03 PM Scope Withdrawal Time: 0 hours 9 minutes 18 seconds  Total Procedure Duration: 1 hour 7 minutes 35 seconds  Estimated Blood Loss:  Estimated blood loss: none.      [facility]

## 2019-07-27 NOTE — Anesthesia Preprocedure Evaluation (Signed)
Anesthesia Evaluation  Patient identified by MRN, date of birth, ID band Patient awake    Reviewed: Allergy & Precautions, NPO status , Patient's Chart, lab work & pertinent test results  Airway Mallampati: II  TM Distance: >3 FB     Dental   Pulmonary PE   Pulmonary exam normal        Cardiovascular hypertension, + DVT  Normal cardiovascular exam+ dysrhythmias Atrial Fibrillation      Neuro/Psych TIA Neuromuscular disease negative psych ROS   GI/Hepatic Neg liver ROS,   Endo/Other  negative endocrine ROS  Renal/GU   negative genitourinary   Musculoskeletal  (+) Arthritis , Osteoarthritis,    Abdominal Normal abdominal exam  (+)   Peds negative pediatric ROS (+)  Hematology negative hematology ROS (+)   Anesthesia Other Findings Past Medical History: No date: AF (atrial fibrillation) (HCC) No date: Allergy No date: Arthritis     Comment:  Osteoarthritis No date: Arthritis of knee No date: Atrial fibrillation (HCC) No date: DVT (deep venous thrombosis) (HCC)     Comment:  L leg No date: Hyperchloremia No date: Hypercholesterolemia No date: Hypertension No date: Nephrolithiasis No date: Parkinson's disease (HCC) No date: Plantar wart No date: Pulmonary embolism (HCC) No date: Sciatica No date: Seasonal allergies No date: TIA (transient ischemic attack)  Reproductive/Obstetrics                             Anesthesia Physical Anesthesia Plan  ASA: III  Anesthesia Plan: General   Post-op Pain Management:    Induction: Intravenous  PONV Risk Score and Plan:   Airway Management Planned: Nasal Cannula  Additional Equipment:   Intra-op Plan:   Post-operative Plan:   Informed Consent: I have reviewed the patients History and Physical, chart, labs and discussed the procedure including the risks, benefits and alternatives for the proposed anesthesia with the patient or  authorized representative who has indicated his/her understanding and acceptance.     Dental advisory given  Plan Discussed with: CRNA and Surgeon  Anesthesia Plan Comments:         Anesthesia Quick Evaluation

## 2019-07-27 NOTE — OR Nursing (Signed)
Patient's wife requested Dr. Marius Ditch to call her regarding procedure results this afternoon/evening as patient and wife are ready to leave.  Dr. Marius Ditch notified.  Instructed patient's wife to get help with patient getting out of the car into home.  Wife said she would.  Wife asked to call me to let me know that she had gotten help.  Endoscopy telephone given to her.

## 2019-07-27 NOTE — Transfer of Care (Signed)
Immediate Anesthesia Transfer of Care Note  Patient: Thomas [name]  Procedure(s) Performed: COLONOSCOPY WITH PROPOFOL (N/A )  Patient Location: PACU  Anesthesia Type:General  Level of Consciousness: awake and sedated  Airway & Oxygen Therapy: Patient Spontanous Breathing and Patient connected to nasal cannula oxygen  Post-op Assessment: Report given to RN  Post vital signs: Reviewed and stable  Last Vitals:  Vitals Value Taken Time  BP    Temp    Pulse    Resp    SpO2      Last Pain:  Vitals:   07/27/19 1055  TempSrc: Temporal  PainSc: 0-No pain         Complications: No apparent anesthesia complications

## 2019-07-27 NOTE — H&P (Signed)
Thomas Darby, MD [address]  [place]  [place], [place] [address]  Main: [phone]  Fax: [phone] Pager: [phone]  Primary Care Physician:  [name], DO Primary Gastroenterologist:  Dr. Cephas [name]  Pre-Procedure History & Physical: HPI:  Thomas [name] is a 69 y.o. male is here for an colonoscopy.   Past Medical History:  Diagnosis Date  . AF (atrial fibrillation) ([place])   . Allergy   . Arthritis    Osteoarthritis  . Arthritis of knee   . Atrial fibrillation ([place])   . DVT (deep venous thrombosis) (HCC)    L leg  . Hyperchloremia   . Hypercholesterolemia   . Hypertension   . Nephrolithiasis   . Parkinson's disease ([place])   . Plantar wart   . Pulmonary embolism ([place])   . Sciatica   . Seasonal allergies   . TIA (transient ischemic attack)     Past Surgical History:  Procedure Laterality Date  . APPENDECTOMY    . KNEE ARTHROSCOPY WITH MENISCAL REPAIR Right 2009  . KNEE SURGERY Right   . T&A    . TONSILLECTOMY      Prior to Admission medications   Medication Sig Start Date End Date Taking? Authorizing Provider  acetaminophen (TYLENOL) 325 MG tablet Take 325 mg by mouth every 6 (six) hours as needed.   Yes [provider]  amantadine (SYMMETREL) 100 MG capsule TAKE 1 CAPSULE THREE TIMES DAILY 04/22/19  Yes Tat, Eustace Quail, DO  amLODipine (NORVASC) 2.5 MG tablet Take 0.5 tablets (1.25 mg total) by mouth daily. 06/28/19  Yes Johnson, Megan P, DO  carbidopa-levodopa (SINEMET CR) 50-200 MG tablet TAKE 1 TABLET AT BEDTIME 07/14/19  Yes Tat, Rebecca S, DO  carbidopa-levodopa (SINEMET IR) 25-100 MG tablet TAKE 2 TABLETS AT 6AM, 10AM, AND 2PM, AND TAKE 1 TABLET AT 6PM AS DIRECTED. 04/22/19  Yes Tat, Eustace Quail, DO  cetirizine (ZYRTEC) 10 MG tablet Take 10 mg by mouth daily.   Yes [provider]  cholecalciferol (VITAMIN D3) 25 MCG (1000 UNIT) tablet Take 1,000 Units by mouth daily.   Yes [provider]  diclofenac Sodium  (VOLTAREN) 1 % GEL APPLY TOPICALLY 4 GRAMS FOUR TIMES DAILY 04/01/19  Yes Johnson, Megan P, DO  lisinopril (ZESTRIL) 30 MG tablet Take 1 tablet (30 mg total) by mouth daily. 06/28/19  Yes Johnson, Megan P, DO  Melatonin 2.5 MG CAPS Take 1 capsule by mouth at bedtime as needed (for sleep).    Yes [provider]  metoprolol succinate (TOPROL-XL) 25 MG 24 hr tablet Take 1 tablet (25 mg total) by mouth daily. 06/28/19  Yes Johnson, Megan P, DO  Multiple Vitamin (MULTIVITAMIN) tablet Take 1 tablet by mouth daily.   Yes [provider]  PREVIDENT 5000 BOOSTER PLUS 1.1 % PSTE See admin instructions. 02/26/18  Yes [provider]  rivaroxaban (XARELTO) 20 MG TABS tablet Take 1 tablet (20 mg total) by mouth daily. 12/21/18  Yes Johnson, Megan P, DO  rOPINIRole (REQUIP) 2 MG tablet TAKE 1 TABLET THREE TIMES DAILY 04/01/19  Yes Tat, Eustace Quail, DO  sildenafil (REVATIO) 20 MG tablet Take 1 tablet (20 mg total) by mouth 3 (three) times daily as needed. 09/30/18  Yes Johnson, Megan P, DO  simvastatin (ZOCOR) 40 MG tablet Take 1 tablet (40 mg total) by mouth daily. 06/28/19  Yes Johnson, Megan P, DO  tamsulosin (FLOMAX) 0.4 MG CAPS capsule Take 1 capsule (0.4 mg total) by mouth daily.  06/28/19  Yes Johnson, Megan P, DO  traZODone (DESYREL) 50 MG tablet Take 0.5-1 tablets (25-50 mg total) by mouth at bedtime as needed for sleep. 06/28/19  Yes Johnson, Megan P, DO    Allergies as of 07/04/2019 - Review Complete 07/04/2019  Allergen Reaction Noted  . Codeine Other (See Comments) 06/26/2014    Family History  Problem Relation Age of Onset  . Parkinson's disease Father   . Coronary artery disease Mother   . Heart attack Sister   . Deep vein thrombosis Sister     Social History   Socioeconomic History  . Marital status: Married    Spouse name: [name]  . Number of children: 0  . Years of education: Not on file  . Highest education level: Some college, no degree  Occupational History   . Occupation: retired    Comment: machinest  Tobacco Use  . Smoking status: Never Smoker  . Smokeless tobacco: Never Used  Substance and Sexual Activity  . Alcohol use: No    Alcohol/week: 0.0 standard drinks  . Drug use: No  . Sexual activity: Not on file  Other Topics Concern  . Not on file  Social History Narrative   ** Merged History Encounter **    right handed   Two story home    Live with spouse   Social Determinants of Health   Financial Resource Strain: Low Risk   . Difficulty of Paying Living Expenses: Not hard at all  Food Insecurity: No Food Insecurity  . Worried About Charity fundraiser in the Last Year: Never true  . Ran Out of Food in the Last Year: Never true  Transportation Needs:   . Lack of Transportation (Medical):   Marland Kitchen Lack of Transportation (Non-Medical):   Physical Activity:   . Days of Exercise per Week:   . Minutes of Exercise per Session:   Stress: No Stress Concern Present  . Feeling of Stress : Not at all  Social Connections:   . Frequency of Communication with Friends and Family:   . Frequency of Social Gatherings with Friends and Family:   . Attends Religious Services:   . Active Member of Clubs or Organizations:   . Attends Archivist Meetings:   Marland Kitchen Marital Status:   Intimate Partner Violence:   . Fear of Current or Ex-Partner:   . Emotionally Abused:   Marland Kitchen Physically Abused:   . Sexually Abused:     Review of Systems: See HPI, otherwise negative ROS  Physical Exam: BP (!) 168/95   Pulse (!) 58   Temp (!) 97.2 F (36.2 C) (Temporal)   Resp 18   Ht 5\' 10"  (1.778 m)   Wt 108.9 kg   SpO2 96%   BMI 34.44 kg/m  General:   Alert,  pleasant and cooperative in NAD Head:  Normocephalic and atraumatic. Neck:  Supple; no masses or thyromegaly. Lungs:  Clear throughout to auscultation.    Heart:  Regular rate and rhythm. Abdomen:  Soft, nontender and nondistended. Normal bowel sounds, without guarding, and without rebound.    Neurologic:  Alert and  oriented x4;  grossly normal neurologically.  Impression/Plan: [name] is here for an colonoscopy to be performed for positive cologaurd  Risks, benefits, limitations, and alternatives regarding  colonoscopy have been reviewed with the patient.  Questions have been answered.  All parties agreeable.   Sherri Sear, MD  07/27/2019, 11:00 AM

## 2019-07-28 ENCOUNTER — Encounter: Payer: Self-pay | Admitting: *Deleted

## 2019-07-28 NOTE — Anesthesia Postprocedure Evaluation (Signed)
Anesthesia Post Note  Patient: Thomas [name]  Procedure(s) Performed: COLONOSCOPY WITH PROPOFOL (N/A )  Patient location during evaluation: Endoscopy Anesthesia Type: General Level of consciousness: awake and alert and oriented Pain management: pain level controlled Vital Signs Assessment: post-procedure vital signs reviewed and stable Respiratory status: spontaneous breathing Cardiovascular status: blood pressure returned to baseline Anesthetic complications: no     Last Vitals:  Vitals:   07/27/19 1355 07/27/19 1405  BP: (!) 183/96 (!) 194/106  Pulse:    Resp:    Temp:    SpO2:      Last Pain:  Vitals:   07/27/19 1405  TempSrc:   PainSc: 0-No pain                 [name]

## 2019-07-29 LAB — SURGICAL PATHOLOGY

## 2019-08-01 ENCOUNTER — Telehealth: Payer: Self-pay

## 2019-08-01 NOTE — Telephone Encounter (Signed)
Called and patient wife verbalized understanding. Put patient in recall list for 3 years

## 2019-08-01 NOTE — Telephone Encounter (Signed)
-----   Message from Lin Landsman, MD sent at 07/29/2019  1:13 PM EDT ----- Please inform patient that pathology sample showed fecal material only.  There was no tissue identified.  Because of poor prep and prolonged procedure it was hard to collect the resected polyps  He will need surveillance colonoscopy in 3 years based on his medical condition at that time  [facility]

## 2019-08-10 ENCOUNTER — Ambulatory Visit (INDEPENDENT_AMBULATORY_CARE_PROVIDER_SITE_OTHER): Payer: Medicare HMO | Admitting: General Practice

## 2019-08-10 DIAGNOSIS — E7800 Pure hypercholesterolemia, unspecified: Secondary | ICD-10-CM | POA: Diagnosis not present

## 2019-08-10 DIAGNOSIS — I10 Essential (primary) hypertension: Secondary | ICD-10-CM | POA: Diagnosis not present

## 2019-08-10 DIAGNOSIS — F419 Anxiety disorder, unspecified: Secondary | ICD-10-CM

## 2019-08-10 DIAGNOSIS — G20 Parkinson's disease: Secondary | ICD-10-CM

## 2019-08-10 NOTE — Patient Instructions (Signed)
Visit Information  Goals Addressed            This Visit's Progress    RN- I want to stay independent (pt-stated)       Current Barriers:   Chronic Disease Management support and education needs related to Parkinson's Disease  Nurse Case Manager Clinical Goal(s):   Over the next 120 days, patient will work with [facility]  to address needs related to remaining independent   Interventions:   Discussed plans with patient for ongoing care management follow up and provided patient with direct contact information for care management team  Evaluated the use of the bed rail.  The patient states this is very helpful for him and he is glad he has this to use- completed   Assessed new safety concerns with the patient.  The patient uses a "hemi-walker" in the am for about 30 minutes before attempting to ambulate without assistance.  The patient feels safe in his home environment.  The patient verbalized he has had no falls. The patient is having some difficulty with getting up and down when sitting and moves to the edge of the chair. The patient is doing well with this process. Inquired about a lift chair and the patient does not want to get a lift chair until he can no longer independently get up and down out of the chair. Has worked with PT. The patient is doing exercises that help with muscle strength.   Assessed activity level and the patient is going to "[employer] and "[employer] The patient states he feels much better once he has had a work out. The patient praised for positive healthy habits. The patient enjoys boxing at least 3 times a week and socializing with his friends by going out for coffee.   Patient Self Care Activities:   Attends all scheduled provider appointments  Performs ADL's independently  Patient wanting to stay independent as long as possible  Please see past updates related to this goal by clicking on the "Past Updates" button in the selected goal       RNCM: I am  taking my blood pressure 2 times a day.  I have a new pill I am taking for my blood pressure       Current Barriers:   Chronic Disease Management support and education needs related to HTN and HLD   Nurse Case Manager Clinical Goal(s):   Over the next 120 days, patient will verbalize understanding of plan for HTN and HLD  Over the next 120 days, patient will attend all scheduled medical appointments: next appointment scheduled colonoscopy on 07-26-2019- completed   Over the next 120 days, patient will demonstrate improved adherence to prescribed treatment plan for HTN and HLD as evidenced by continuing to monitor blood pressure BID, and adherence to a low sodium, heart healthy diet  Over the next 120 days, patient will demonstrate improved health management independence as evidenced by continuing to independently maintain health and well being with the assistance of his spouse when needed  Over the next 90 days, patient will work with CM team pharmacist to effectively manage medications and monitor for any issues such as orthostatic hypotension related to the patient taking amlodipine 2.5mg    Interventions:   Evaluation of current treatment plan related to HTN and HLD and patient's adherence to plan as established by provider.  Provided education to patient re: orthostatic hypotension, changing position slowly and safety concerns in the patient with multiple chronic disease processes  Discussed plans with patient for ongoing care management follow up and provided patient with direct contact information for care management team  Advised patient, providing education and rationale, to monitor blood pressure daily and record, calling pcp for findings outside established parameters.   Reviewed scheduled/upcoming provider appointments including: CCM team and scheduled colonoscopy on 07/26/2019. Patient states that there was an abnormal "colon guard" result so he is following up with a colonoscopy.  Per the patients wife this did not go well and the patient will not have another colonoscopy.  There were some polyps removed but the patient had to do the prep x 2 and it really took a log out of him. They have decided no more colonoscopies.   The patient verbalized that he is still having issues with blood pressure fluctuations. He denies dizziness or light headiness when his pressure drops in the [id number] or [id number] Systolic. The patient reports his pressure this am was 116/70.  He is keeping a good record of his blood pressure readings and has parameters to follow.    Pharmacy referral for medication management and follow up for any new concerns related to addition of amlodipine to the current treatment regimen. Ongoing support and help with medication management   Assessed patients activity level. He is going to [employer] and [employer] and says he feels better once he has had a work out session.   Patient Self Care Activities:   Patient verbalizes understanding of plan to manage HTN and HLD  Attends all scheduled provider appointments  Calls provider office for new concerns or questions  Unable to independently manage chronic conditions of HTN and HLD  Please see past updates related to this goal by clicking on the "Past Updates" button in the selected goal       RNCM: pt's wife: "I need information on a support to help with Greg's head tilting." (pt-stated)       CARE PLAN ENTRY (see longitudinal plan of care for additional care plan information)  Current Barriers:   Knowledge Deficits related to head supports for patient with Parkinson's with new onset of head tilt to the right.   Care Coordination needs related to support devices  in a patient with Parkinson's  (disease states)  Chronic Disease Management support and education needs related to Parkinson's disease and progression and symptom management   Nurse Case Manager Clinical Goal(s):   Over the next 120 days, patient will  work with [facility], CCM team and pcp  to address needs related to support devices to use in the patient with head tilt to the right related to progression of Parkinson's   Over the next 120 days, patient will demonstrate a decrease in lack of head control exacerbations as evidenced by having support devices that will assist with health and wellness  Interventions:   Inter-disciplinary care team collaboration (see longitudinal plan of care)  Evaluation of current treatment plan related to Parkinson's Disease and patient's adherence to plan as established by provider.  Advised patient to look for email correspondence related to supports to help with neck   Provided education to patient re: current available information on neck supports. The patients wife verbalized the support needs to be flat in the back. A travel pillow does not work well for the patient.  The wife is wanting help the patient find something that will support his neck and shoulders. Education and support gathered and sent to the patients wife by email at [email or web address].  Also sent  information by the [facility] system. Will continue to research and send related information to the patient and patients wife.   Collaborated with RNCM colleagues and pcp regarding support devices for head tilt in patient with Parkinson's.  The pcp recommended the patient and wife reaching out to the neurologist for specific help with recommendations for neck support.   Discussed plans with patient for ongoing care management follow up and provided patient with direct contact information for care management team  Provided patient with Parkinson's  educational materials related to activities, supports to use in home and other resources for better understanding of Parkinson's and the disease progression.   Patient Self Care Activities:   Patient verbalizes understanding of plan to receive information by email on support devices for head tilt in patient with  Parkinson's   Calls provider office for new concerns or questions  Unable to independently control tilting of head to the right in a patient with progression of Parkinson's   Initial goal documentation        Patient verbalizes understanding of instructions provided today.   Telephone follow up appointment with care management team member scheduled for: Aug 30, 2019  Noreene Larsson RN, MSN, [facility] Mobile: [phone]

## 2019-08-10 NOTE — Chronic Care Management (AMB) (Signed)
Chronic Care Management   Follow Up Note   08/10/2019 Name: Thomas [name] MRN: [id number] DOB: 06/24/1950  Referred by: [name], DO Reason for referral : Chronic Care Management (VM from the patients wife asking for a call back: Parkinson's support devices and other concerns.)   Thomas [name] is a 69 y.o. year old male who is a primary care patient of [name], DO. The CCM team was consulted for assistance with chronic disease management and care coordination needs.    Review of patient status, including review of consultants reports, relevant laboratory and other test results, and collaboration with appropriate care team members and the patient's provider was performed as part of comprehensive patient evaluation and provision of chronic care management services.    SDOH (Social Determinants of Health) assessments performed: No See Care Plan activities for detailed interventions related to [facility])     Outpatient Encounter Medications as of 08/10/2019  Medication Sig   acetaminophen (TYLENOL) 325 MG tablet Take 325 mg by mouth every 6 (six) hours as needed.   amantadine (SYMMETREL) 100 MG capsule TAKE 1 CAPSULE THREE TIMES DAILY   amLODipine (NORVASC) 2.5 MG tablet Take 0.5 tablets (1.25 mg total) by mouth daily.   carbidopa-levodopa (SINEMET CR) 50-200 MG tablet TAKE 1 TABLET AT BEDTIME   carbidopa-levodopa (SINEMET IR) 25-100 MG tablet TAKE 2 TABLETS AT 6AM, 10AM, AND 2PM, AND TAKE 1 TABLET AT 6PM AS DIRECTED.   cetirizine (ZYRTEC) 10 MG tablet Take 10 mg by mouth daily.   cholecalciferol (VITAMIN D3) 25 MCG (1000 UNIT) tablet Take 1,000 Units by mouth daily.   diclofenac Sodium (VOLTAREN) 1 % GEL APPLY TOPICALLY 4 GRAMS FOUR TIMES DAILY   lisinopril (ZESTRIL) 30 MG tablet Take 1 tablet (30 mg total) by mouth daily.   Melatonin 2.5 MG CAPS Take 1 capsule by mouth at bedtime as needed (for sleep).    metoprolol succinate (TOPROL-XL) 25 MG 24 hr tablet Take 1  tablet (25 mg total) by mouth daily.   Multiple Vitamin (MULTIVITAMIN) tablet Take 1 tablet by mouth daily.   PREVIDENT 5000 BOOSTER PLUS 1.1 % PSTE See admin instructions.   rivaroxaban (XARELTO) 20 MG TABS tablet Take 1 tablet (20 mg total) by mouth daily.   rOPINIRole (REQUIP) 2 MG tablet TAKE 1 TABLET THREE TIMES DAILY   sildenafil (REVATIO) 20 MG tablet Take 1 tablet (20 mg total) by mouth 3 (three) times daily as needed.   simvastatin (ZOCOR) 40 MG tablet Take 1 tablet (40 mg total) by mouth daily.   tamsulosin (FLOMAX) 0.4 MG CAPS capsule Take 1 capsule (0.4 mg total) by mouth daily.   traZODone (DESYREL) 50 MG tablet Take 0.5-1 tablets (25-50 mg total) by mouth at bedtime as needed for sleep.   No facility-administered encounter medications on file as of 08/10/2019.     Objective:  BP Readings from Last 3 Encounters:  07/27/19 (!) 194/106  07/06/19 116/70  07/01/19 128/88    Goals Addressed            This Visit's Progress    RN- I want to stay independent (pt-stated)       Current Barriers:   Chronic Disease Management support and education needs related to Parkinson's Disease  Nurse Case Manager Clinical Goal(s):   Over the next 120 days, patient will work with [facility]  to address needs related to remaining independent   Interventions:   Discussed plans with patient for ongoing care management follow up and provided  patient with direct contact information for care management team  Evaluated the use of the bed rail.  The patient states this is very helpful for him and he is glad he has this to use- completed   Assessed new safety concerns with the patient.  The patient uses a "hemi-walker" in the am for about 30 minutes before attempting to ambulate without assistance.  The patient feels safe in his home environment.  The patient verbalized he has had no falls. The patient is having some difficulty with getting up and down when sitting and moves to the edge of  the chair. The patient is doing well with this process. Inquired about a lift chair and the patient does not want to get a lift chair until he can no longer independently get up and down out of the chair. Has worked with PT. The patient is doing exercises that help with muscle strength.   Assessed activity level and the patient is going to "[employer] and "[employer]". The patient states he feels much better once he has had a work out. The patient praised for positive healthy habits. The patient enjoys boxing at least 3 times a week and socializing with his friends by going out for coffee.   Patient Self Care Activities:   Attends all scheduled provider appointments  Performs ADL's independently  Patient wanting to stay independent as long as possible  Please see past updates related to this goal by clicking on the "Past Updates" button in the selected goal       RNCM: I am taking my blood pressure 2 times a day.  I have a new pill I am taking for my blood pressure       Current Barriers:   Chronic Disease Management support and education needs related to HTN and HLD   Nurse Case Manager Clinical Goal(s):   Over the next 120 days, patient will verbalize understanding of plan for HTN and HLD  Over the next 120 days, patient will attend all scheduled medical appointments: next appointment scheduled colonoscopy on 07-26-2019- completed   Over the next 120 days, patient will demonstrate improved adherence to prescribed treatment plan for HTN and HLD as evidenced by continuing to monitor blood pressure BID, and adherence to a low sodium, heart healthy diet  Over the next 120 days, patient will demonstrate improved health management independence as evidenced by continuing to independently maintain health and well being with the assistance of his spouse when needed  Over the next 90 days, patient will work with CM team pharmacist to effectively manage medications and monitor for any issues such  as orthostatic hypotension related to the patient taking amlodipine 2.5mg    Interventions:   Evaluation of current treatment plan related to HTN and HLD and patient's adherence to plan as established by provider.  Provided education to patient re: orthostatic hypotension, changing position slowly and safety concerns in the patient with multiple chronic disease processes  Discussed plans with patient for ongoing care management follow up and provided patient with direct contact information for care management team  Advised patient, providing education and rationale, to monitor blood pressure daily and record, calling pcp for findings outside established parameters.   Reviewed scheduled/upcoming provider appointments including: CCM team and scheduled colonoscopy on 07/26/2019. Patient states that there was an abnormal "colon guard" result so he is following up with a colonoscopy. Per the patients wife this did not go well and the patient will not have another colonoscopy.  There were some polyps removed but the patient had to do the prep x 2 and it really took a log out of him. They have decided no more colonoscopies.   The patient verbalized that he is still having issues with blood pressure fluctuations. He denies dizziness or light headiness when his pressure drops in the [id number] or [id number] Systolic. The patient reports his pressure this am was 116/70.  He is keeping a good record of his blood pressure readings and has parameters to follow.    Pharmacy referral for medication management and follow up for any new concerns related to addition of amlodipine to the current treatment regimen. Ongoing support and help with medication management   Assessed patients activity level. He is going to [employer] and [employer] and says he feels better once he has had a work out session.   Patient Self Care Activities:   Patient verbalizes understanding of plan to manage HTN and HLD  Attends all scheduled provider  appointments  Calls provider office for new concerns or questions  Unable to independently manage chronic conditions of HTN and HLD  Please see past updates related to this goal by clicking on the "Past Updates" button in the selected goal       RNCM: pt's wife: "I need information on a support to help with Greg's head tilting." (pt-stated)       CARE PLAN ENTRY (see longitudinal plan of care for additional care plan information)  Current Barriers:   Knowledge Deficits related to head supports for patient with Parkinson's with new onset of head tilt to the right.   Care Coordination needs related to support devices  in a patient with Parkinson's  (disease states)  Chronic Disease Management support and education needs related to Parkinson's disease and progression and symptom management   Nurse Case Manager Clinical Goal(s):   Over the next 120 days, patient will work with [facility], CCM team and pcp  to address needs related to support devices to use in the patient with head tilt to the right related to progression of Parkinson's   Over the next 120 days, patient will demonstrate a decrease in lack of head control exacerbations as evidenced by having support devices that will assist with health and wellness  Interventions:   Inter-disciplinary care team collaboration (see longitudinal plan of care)  Evaluation of current treatment plan related to Parkinson's Disease and patient's adherence to plan as established by provider.  Advised patient to look for email correspondence related to supports to help with neck   Provided education to patient re: current available information on neck supports. The patients wife verbalized the support needs to be flat in the back. A travel pillow does not work well for the patient.  The wife is wanting help the patient find something that will support his neck and shoulders. Education and support gathered and sent to the patients wife by email at  [email or web address].  Also sent information by the [facility] system. Will continue to research and send related information to the patient and patients wife.   Collaborated with RNCM colleagues and pcp regarding support devices for head tilt in patient with Parkinson's.  The pcp recommended the patient and wife reaching out to the neurologist for specific help with recommendations for neck support.   Discussed plans with patient for ongoing care management follow up and provided patient with direct contact information for care management team  Provided patient with Parkinson's  educational materials related to activities,  supports to use in home and other resources for better understanding of Parkinson's and the disease progression.   Patient Self Care Activities:   Patient verbalizes understanding of plan to receive information by email on support devices for head tilt in patient with Parkinson's   Calls provider office for new concerns or questions  Unable to independently control tilting of head to the right in a patient with progression of Parkinson's   Initial goal documentation         Plan:   Telephone follow up appointment with care management team member scheduled for:May 18th   Noreene Larsson RN, MSN, [facility] Mobile: [phone]

## 2019-08-16 ENCOUNTER — Other Ambulatory Visit: Payer: Self-pay | Admitting: Neurology

## 2019-08-16 DIAGNOSIS — G20 Parkinson's disease: Secondary | ICD-10-CM

## 2019-08-26 ENCOUNTER — Other Ambulatory Visit: Payer: Self-pay | Admitting: Family Medicine

## 2019-08-26 DIAGNOSIS — S40212A Abrasion of left shoulder, initial encounter: Secondary | ICD-10-CM | POA: Diagnosis not present

## 2019-08-26 DIAGNOSIS — L089 Local infection of the skin and subcutaneous tissue, unspecified: Secondary | ICD-10-CM | POA: Diagnosis not present

## 2019-08-30 ENCOUNTER — Ambulatory Visit (INDEPENDENT_AMBULATORY_CARE_PROVIDER_SITE_OTHER): Payer: Medicare HMO | Admitting: General Practice

## 2019-08-30 ENCOUNTER — Telehealth: Payer: Self-pay | Admitting: General Practice

## 2019-08-30 DIAGNOSIS — I10 Essential (primary) hypertension: Secondary | ICD-10-CM

## 2019-08-30 DIAGNOSIS — E7800 Pure hypercholesterolemia, unspecified: Secondary | ICD-10-CM | POA: Diagnosis not present

## 2019-08-30 DIAGNOSIS — G20 Parkinson's disease: Secondary | ICD-10-CM

## 2019-08-30 NOTE — Patient Instructions (Signed)
Visit Information  Goals Addressed            This Visit's Progress   . RN- I want to stay independent (pt-stated)       Current Barriers:  . Chronic Disease Management support and education needs related to Parkinson's Disease  Nurse Case Manager Clinical Goal(s):  Marland Kitchen Over the next 120 days, patient will work with [facility]  to address needs related to remaining independent   Interventions:  . Discussed plans with patient for ongoing care management follow up and provided patient with direct contact information for care management team . Evaluated the use of the bed rail.  The patient states this is very helpful for him and he is glad he has this to use- completed  . Assessed new safety concerns with the patient.  The patient uses a "hemi-walker" in the am for about 30 minutes before attempting to ambulate without assistance.  The patient feels safe in his home environment. The patient is having some difficulty with getting up and down when sitting and moves to the edge of the chair. The patient is doing well with this process. Inquired about a lift chair and the patient does not want to get a lift chair until he can no longer independently get up and down out of the chair. Has worked with PT. The patient is doing exercises that help with muscle strength. The patient had a fall with injury 2 to 3 weeks ago. The patient had an abrasion area. Carpet burn to his shoulder. The patients wife states it got infected and puss was coming out of it. The patient went to urgent care on Friday and has been taking an antibiotic. Has 3 more days of antibiotic left to take. Education on taking all of antibiotic. The patient was coming down the steps and missed the last step. The patients wife is monitoring the area and says it is scabbed over and healing now.  . Assessed activity level and the patient is going to "[employer] and "[employer]". The patient states he feels much better once he has had a work out. The  patient praised for positive healthy habits. The patient enjoys boxing at least 3 times a week and socializing with his friends by going out for coffee. The patient is still going to do boxing.  The patient states he knows there is a change in his level of endurance. The patient states he is stuttering more and also that he is "spent out" if he tries to walk over 30 minutes.   Patient Self Care Activities:  . Attends all scheduled provider appointments . Performs ADL's independently . Patient wanting to stay independent as long as possible  Please see past updates related to this goal by clicking on the "Past Updates" button in the selected goal      . RNCM: I am taking my blood pressure 2 times a day.  I have a new pill I am taking for my blood pressure       Current Barriers:  . Chronic Disease Management support and education needs related to HTN and HLD   Nurse Case Manager Clinical Goal(s):  Marland Kitchen Over the next 120 days, patient will verbalize understanding of plan for HTN and HLD . Over the next 120 days, patient will attend all scheduled medical appointments: next appointment scheduled colonoscopy on 07-26-2019- completed  . Over the next 120 days, patient will demonstrate improved adherence to prescribed treatment plan for HTN and HLD  as evidenced by continuing to monitor blood pressure BID, and adherence to a low sodium, heart healthy diet . Over the next 120 days, patient will demonstrate improved health management independence as evidenced by continuing to independently maintain health and well being with the assistance of his spouse when needed . Over the next 90 days, patient will work with CM team pharmacist to effectively manage medications and monitor for any issues such as orthostatic hypotension related to the patient taking amlodipine 2.5mg    Interventions:  . Evaluation of current treatment plan related to HTN and HLD and patient's adherence to plan as established by  provider. . Provided education to patient re: orthostatic hypotension, changing position slowly and safety concerns in the patient with multiple chronic disease processes . Discussed plans with patient for ongoing care management follow up and provided patient with direct contact information for care management team . Advised patient, providing education and rationale, to monitor blood pressure daily and record, calling pcp for findings outside established parameters.  . Reviewed scheduled/upcoming provider appointments including: CCM team and scheduled colonoscopy on 07/26/2019. Patient states that there was an abnormal "colon guard" result so he is following up with a colonoscopy. Per the patients wife this did not go well and the patient will not have another colonoscopy.  There were some polyps removed but the patient had to do the prep x 2 and it really took a log out of him. They have decided no more colonoscopies.  . The patient verbalized that he is still having issues with blood pressure fluctuations. This is no new issue and is known by pcp.  He denies dizziness or light headiness when his pressure drops in the [id number] or [id number] Systolic. He is keeping a good record of his blood pressure readings and has parameters to follow.  His wife verbalized she has salty snacks for him to eat when it drops really low. The patient verbalized he thinks this is due to the progression of his parkinson's.  . Pharmacy referral for medication management and follow up for any new concerns related to addition of amlodipine to the current treatment regimen. Ongoing support and help with medication management  . Assessed patients activity level. He is going to [employer] and [employer] and says he feels better once he has had a work out session.   Patient Self Care Activities:  . Patient verbalizes understanding of plan to manage HTN and HLD . Attends all scheduled provider appointments . Calls provider office for new  concerns or questions . Unable to independently manage chronic conditions of HTN and HLD  Please see past updates related to this goal by clicking on the "Past Updates" button in the selected goal      . RNCM: pt's wife: "I need information on a support to help with Greg's head tilting." (pt-stated)       CARE PLAN ENTRY (see longitudinal plan of care for additional care plan information)  Current Barriers:  Marland Kitchen Knowledge Deficits related to head supports for patient with Parkinson's with new onset of head tilt to the right.  . Care Coordination needs related to support devices  in a patient with Parkinson's  (disease states) . Chronic Disease Management support and education needs related to Parkinson's disease and progression and symptom management   Nurse Case Manager Clinical Goal(s):  Marland Kitchen Over the next 120 days, patient will work with [facility], CCM team and pcp  to address needs related to support devices to use in  the patient with head tilt to the right related to progression of Parkinson's  . Over the next 120 days, patient will demonstrate a decrease in lack of head control exacerbations as evidenced by having support devices that will assist with health and wellness  Interventions:  . Inter-disciplinary care team collaboration (see longitudinal plan of care) . Evaluation of current treatment plan related to Parkinson's Disease and patient's adherence to plan as established by provider. . Advised patient to look for email correspondence related to supports to help with neck. Done  . Provided education to patient re: current available information on neck supports. The patients wife verbalized the support needs to be flat in the back. A travel pillow does not work well for the patient.  The wife is wanting help the patient find something that will support his neck and shoulders. Education and support gathered and sent to the patients wife by email at [email or web address].  Also sent information  by the [facility] system. Will continue to research and send related information to the patient and patients wife. Per the patients wife, an elderly friend from church has taken the travel pillow and shaved some of the stuffing from the back and this is helping the patient much better. They are concerned that some of the devices used to support the head are bulky and make the patient look like he is an invalid. The patient wants to remain as independent as possible.  Nash Dimmer with RNCM colleagues and pcp regarding support devices for head tilt in patient with Parkinson's.  The pcp recommended the patient and wife reaching out to the neurologist for specific help with recommendations for neck support.  . Discussed plans with patient for ongoing care management follow up and provided patient with direct contact information for care management team . Provided patient with Parkinson's  educational materials related to activities, supports to use in home and other resources for better understanding of Parkinson's and the disease progression.   Patient Self Care Activities:  . Patient verbalizes understanding of plan to receive information by email on support devices for head tilt in patient with Parkinson's  . Calls provider office for new concerns or questions . Unable to independently control tilting of head to the right in a patient with progression of Parkinson's   Please see past updates related to this goal by clicking on the "Past Updates" button in the selected goal         Patient verbalizes understanding of instructions provided today.   Telephone follow up appointment with care management team member scheduled for: 10-18-2019 at 10 am  [place], MSN, [facility] Mobile: [phone]

## 2019-08-30 NOTE — Chronic Care Management (AMB) (Signed)
Chronic Care Management   Follow Up Note   08/30/2019 Name: Thomas [name] MRN: [id number] DOB: 1950/06/01  Referred by: [name], DO Reason for referral : Chronic Care Management (Follow up on Parkinsons/HTN/Hypercholesterolemia and new concerns)   Thomas [name] is a 69 y.o. year old male who is a primary care patient of [name], DO. The CCM team was consulted for assistance with chronic disease management and care coordination needs.    Review of patient status, including review of consultants reports, relevant laboratory and other test results, and collaboration with appropriate care team members and the patient's provider was performed as part of comprehensive patient evaluation and provision of chronic care management services.    SDOH (Social Determinants of Health) assessments performed: Yes See Care Plan activities for detailed interventions related to [facility])     Outpatient Encounter Medications as of 08/30/2019  Medication Sig  . acetaminophen (TYLENOL) 325 MG tablet Take 325 mg by mouth every 6 (six) hours as needed.  Marland Kitchen amantadine (SYMMETREL) 100 MG capsule TAKE 1 CAPSULE THREE TIMES DAILY  . amLODipine (NORVASC) 2.5 MG tablet Take 0.5 tablets (1.25 mg total) by mouth daily.  . carbidopa-levodopa (SINEMET CR) 50-200 MG tablet TAKE 1 TABLET AT BEDTIME  . carbidopa-levodopa (SINEMET IR) 25-100 MG tablet TAKE 2 TABLETS AT 6AM, 10AM, AND 2PM, AND TAKE 1 TABLET AT 6PM AS DIRECTED.  Marland Kitchen cetirizine (ZYRTEC) 10 MG tablet Take 10 mg by mouth daily.  . cholecalciferol (VITAMIN D3) 25 MCG (1000 UNIT) tablet Take 1,000 Units by mouth daily.  . diclofenac Sodium (VOLTAREN) 1 % GEL APPLY TOPICALLY 4 GRAMS FOUR TIMES DAILY  . lisinopril (ZESTRIL) 30 MG tablet TAKE 1 TABLET EVERY DAY  . Melatonin 2.5 MG CAPS Take 1 capsule by mouth at bedtime as needed (for sleep).   . metoprolol succinate (TOPROL-XL) 25 MG 24 hr tablet Take 1 tablet (25 mg total) by mouth daily.  . Multiple  Vitamin (MULTIVITAMIN) tablet Take 1 tablet by mouth daily.  Marland Kitchen PREVIDENT 5000 BOOSTER PLUS 1.1 % PSTE See admin instructions.  . rivaroxaban (XARELTO) 20 MG TABS tablet Take 1 tablet (20 mg total) by mouth daily.  Marland Kitchen rOPINIRole (REQUIP) 2 MG tablet TAKE 1 TABLET THREE TIMES DAILY  . sildenafil (REVATIO) 20 MG tablet Take 1 tablet (20 mg total) by mouth 3 (three) times daily as needed.  . simvastatin (ZOCOR) 40 MG tablet Take 1 tablet (40 mg total) by mouth daily.  . tamsulosin (FLOMAX) 0.4 MG CAPS capsule Take 1 capsule (0.4 mg total) by mouth daily.  . traZODone (DESYREL) 50 MG tablet Take 0.5-1 tablets (25-50 mg total) by mouth at bedtime as needed for sleep.   No facility-administered encounter medications on file as of 08/30/2019.     Objective:  BP Readings from Last 3 Encounters:  07/27/19 (!) 194/106  07/06/19 116/70  07/01/19 128/88    Goals Addressed            This Visit's Progress   . RN- I want to stay independent (pt-stated)       Current Barriers:  . Chronic Disease Management support and education needs related to Parkinson's Disease  Nurse Case Manager Clinical Goal(s):  Marland Kitchen Over the next 120 days, patient will work with [facility]  to address needs related to remaining independent   Interventions:  . Discussed plans with patient for ongoing care management follow up and provided patient with direct contact information for care management team . Evaluated the use  of the bed rail.  The patient states this is very helpful for him and he is glad he has this to use- completed  . Assessed new safety concerns with the patient.  The patient uses a "hemi-walker" in the am for about 30 minutes before attempting to ambulate without assistance.  The patient feels safe in his home environment. The patient is having some difficulty with getting up and down when sitting and moves to the edge of the chair. The patient is doing well with this process. Inquired about a lift chair and the  patient does not want to get a lift chair until he can no longer independently get up and down out of the chair. Has worked with PT. The patient is doing exercises that help with muscle strength. The patient had a fall with injury 2 to 3 weeks ago. The patient had an abrasion area. Carpet burn to his shoulder. The patients wife states it got infected and puss was coming out of it. The patient went to urgent care on Friday and has been taking an antibiotic. Has 3 more days of antibiotic left to take. Education on taking all of antibiotic. The patient was coming down the steps and missed the last step. The patients wife is monitoring the area and says it is scabbed over and healing now.  . Assessed activity level and the patient is going to "[employer] and "[employer]". The patient states he feels much better once he has had a work out. The patient praised for positive healthy habits. The patient enjoys boxing at least 3 times a week and socializing with his friends by going out for coffee. The patient is still going to do boxing.  The patient states he knows there is a change in his level of endurance. The patient states he is stuttering more and also that he is "spent out" if he tries to walk over 30 minutes.   Patient Self Care Activities:  . Attends all scheduled provider appointments . Performs ADL's independently . Patient wanting to stay independent as long as possible  Please see past updates related to this goal by clicking on the "Past Updates" button in the selected goal      . RNCM: I am taking my blood pressure 2 times a day.  I have a new pill I am taking for my blood pressure       Current Barriers:  . Chronic Disease Management support and education needs related to HTN and HLD   Nurse Case Manager Clinical Goal(s):  Marland Kitchen Over the next 120 days, patient will verbalize understanding of plan for HTN and HLD . Over the next 120 days, patient will attend all scheduled medical appointments:  next appointment scheduled colonoscopy on 07-26-2019- completed  . Over the next 120 days, patient will demonstrate improved adherence to prescribed treatment plan for HTN and HLD as evidenced by continuing to monitor blood pressure BID, and adherence to a low sodium, heart healthy diet . Over the next 120 days, patient will demonstrate improved health management independence as evidenced by continuing to independently maintain health and well being with the assistance of his spouse when needed . Over the next 90 days, patient will work with CM team pharmacist to effectively manage medications and monitor for any issues such as orthostatic hypotension related to the patient taking amlodipine 2.5mg    Interventions:  . Evaluation of current treatment plan related to HTN and HLD and patient's adherence to plan as established  by provider. . Provided education to patient re: orthostatic hypotension, changing position slowly and safety concerns in the patient with multiple chronic disease processes . Discussed plans with patient for ongoing care management follow up and provided patient with direct contact information for care management team . Advised patient, providing education and rationale, to monitor blood pressure daily and record, calling pcp for findings outside established parameters.  . Reviewed scheduled/upcoming provider appointments including: CCM team and scheduled colonoscopy on 07/26/2019. Patient states that there was an abnormal "colon guard" result so he is following up with a colonoscopy. Per the patients wife this did not go well and the patient will not have another colonoscopy.  There were some polyps removed but the patient had to do the prep x 2 and it really took a log out of him. They have decided no more colonoscopies.  . The patient verbalized that he is still having issues with blood pressure fluctuations. This is no new issue and is known by pcp.  He denies dizziness or light  headiness when his pressure drops in the [id number] or [id number] Systolic. He is keeping a good record of his blood pressure readings and has parameters to follow.  His wife verbalized she has salty snacks for him to eat when it drops really low. The patient verbalized he thinks this is due to the progression of his parkinson's.  . Pharmacy referral for medication management and follow up for any new concerns related to addition of amlodipine to the current treatment regimen. Ongoing support and help with medication management  . Assessed patients activity level. He is going to [employer] and [employer] and says he feels better once he has had a work out session.   Patient Self Care Activities:  . Patient verbalizes understanding of plan to manage HTN and HLD . Attends all scheduled provider appointments . Calls provider office for new concerns or questions . Unable to independently manage chronic conditions of HTN and HLD  Please see past updates related to this goal by clicking on the "Past Updates" button in the selected goal      . RNCM: pt's wife: "I need information on a support to help with Greg's head tilting." (pt-stated)       CARE PLAN ENTRY (see longitudinal plan of care for additional care plan information)  Current Barriers:  Marland Kitchen Knowledge Deficits related to head supports for patient with Parkinson's with new onset of head tilt to the right.  . Care Coordination needs related to support devices  in a patient with Parkinson's  (disease states) . Chronic Disease Management support and education needs related to Parkinson's disease and progression and symptom management   Nurse Case Manager Clinical Goal(s):  Marland Kitchen Over the next 120 days, patient will work with [facility], CCM team and pcp  to address needs related to support devices to use in the patient with head tilt to the right related to progression of Parkinson's  . Over the next 120 days, patient will demonstrate a decrease in lack of head control  exacerbations as evidenced by having support devices that will assist with health and wellness  Interventions:  . Inter-disciplinary care team collaboration (see longitudinal plan of care) . Evaluation of current treatment plan related to Parkinson's Disease and patient's adherence to plan as established by provider. . Advised patient to look for email correspondence related to supports to help with neck. Done  . Provided education to patient re: current available information on neck supports.  The patients wife verbalized the support needs to be flat in the back. A travel pillow does not work well for the patient.  The wife is wanting help the patient find something that will support his neck and shoulders. Education and support gathered and sent to the patients wife by email at [email or web address].  Also sent information by the [facility] system. Will continue to research and send related information to the patient and patients wife. Per the patients wife, an elderly friend from church has taken the travel pillow and shaved some of the stuffing from the back and this is helping the patient much better. They are concerned that some of the devices used to support the head are bulky and make the patient look like he is an invalid. The patient wants to remain as independent as possible.  Nash Dimmer with RNCM colleagues and pcp regarding support devices for head tilt in patient with Parkinson's.  The pcp recommended the patient and wife reaching out to the neurologist for specific help with recommendations for neck support.  . Discussed plans with patient for ongoing care management follow up and provided patient with direct contact information for care management team . Provided patient with Parkinson's  educational materials related to activities, supports to use in home and other resources for better understanding of Parkinson's and the disease progression.   Patient Self Care Activities:  . Patient  verbalizes understanding of plan to receive information by email on support devices for head tilt in patient with Parkinson's  . Calls provider office for new concerns or questions . Unable to independently control tilting of head to the right in a patient with progression of Parkinson's   Please see past updates related to this goal by clicking on the "Past Updates" button in the selected goal          Plan:   Telephone follow up appointment with care management team member scheduled for: 10-18-2019 at 10 am   [place], MSN, [facility] Mobile: [phone]

## 2019-08-31 ENCOUNTER — Ambulatory Visit: Payer: Medicare HMO | Admitting: Hematology and Oncology

## 2019-08-31 ENCOUNTER — Other Ambulatory Visit: Payer: Medicare HMO

## 2019-09-13 ENCOUNTER — Other Ambulatory Visit: Payer: Self-pay | Admitting: Family Medicine

## 2019-09-13 NOTE — Telephone Encounter (Signed)
Requested Prescriptions  Pending Prescriptions Disp Refills  . metoprolol succinate (TOPROL-XL) 25 MG 24 hr tablet [Pharmacy Med Name: METOPROLOL SUCCINATE ER 25 MG Tablet Extended Release 24 Hour] 90 tablet 1    Sig: TAKE 1 TABLET EVERY DAY     Cardiovascular:  Beta Blockers Failed - 09/13/2019 10:21 PM      Failed - Last BP in normal range    BP Readings from Last 1 Encounters:  07/27/19 (!) 194/106         Passed - Last Heart Rate in normal range    Pulse Readings from Last 1 Encounters:  07/27/19 62         Passed - Valid encounter within last 6 months    Recent Outpatient Visits          2 months ago Routine general medical examination at a health care facility   [facility], [place], DO   3 months ago Essential hypertension   [place], [place], DO   4 months ago Essential hypertension   [place], [place], DO   5 months ago Essential hypertension   [place], [place], DO   6 months ago Essential hypertension   [place], [place], DO      Future Appointments            In 3 months Johnson, Megan P, DO [place], [facility]   In 9 months  [employer], [facility]           Elevated BP reading above on 07/27/2019 was on day patient was having colonoscopy.  BP reading on 07/01/19 128/88.

## 2019-09-27 DIAGNOSIS — H2513 Age-related nuclear cataract, bilateral: Secondary | ICD-10-CM | POA: Diagnosis not present

## 2019-09-28 ENCOUNTER — Telehealth: Payer: Self-pay | Admitting: Family Medicine

## 2019-09-28 DIAGNOSIS — H539 Unspecified visual disturbance: Secondary | ICD-10-CM

## 2019-09-28 NOTE — Telephone Encounter (Signed)
Order is set, I just need to know why he needs to go- not for surgery, but a diagnosis

## 2019-09-28 NOTE — Telephone Encounter (Signed)
Patient's wife called back and states that the patient is having vision changes, not able to see things clearly. Uses glasses sometimes and sometimes not.

## 2019-09-28 NOTE — Telephone Encounter (Signed)
Called and LVM asking for patient's wife to please return my call.  °

## 2019-09-28 NOTE — Telephone Encounter (Signed)
Copied from [place] [id number]. Topic: Referral - Request for Referral >> Sep 28, 2019  1:59 PM Yvette Rack wrote: Has patient seen PCP for this complaint? no *If NO, is insurance requiring patient see PCP for this issue before PCP can refer them? Referral for which specialty: Optometry Preferred provider/office: [facility] [address] ph# [phone] fax# [phone] Reason for referral: Per pt spouse pt insurance requires referral from pcp before pt can have eye surgery   Would patient need apt or can referral just be placed?

## 2019-10-15 ENCOUNTER — Other Ambulatory Visit: Payer: Self-pay | Admitting: Family Medicine

## 2019-10-15 NOTE — Telephone Encounter (Signed)
Requested Prescriptions  Pending Prescriptions Disp Refills  . XARELTO 20 MG TABS tablet [Pharmacy Med Name: XARELTO 20 MG Tablet] 90 tablet 3    Sig: TAKE 1 TABLET (20 MG TOTAL) BY MOUTH DAILY.     Hematology: Anticoagulants - rivaroxaban Passed - 10/15/2019  1:48 PM      Passed - ALT in normal range and within 180 days    ALT  Date Value Ref Range Status  06/28/2019 8 0 - 44 IU/L Final         Passed - AST in normal range and within 180 days    AST  Date Value Ref Range Status  06/28/2019 15 0 - 40 IU/L Final         Passed - Cr in normal range and within 360 days    Creatinine, Ser  Date Value Ref Range Status  06/28/2019 1.20 0.76 - 1.27 mg/dL Final         Passed - HCT in normal range and within 360 days    Hematocrit  Date Value Ref Range Status  06/28/2019 44.7 37.5 - 51.0 % Final         Passed - HGB in normal range and within 360 days    Hemoglobin  Date Value Ref Range Status  06/28/2019 14.9 13.0 - 17.7 g/dL Final         Passed - PLT in normal range and within 360 days    Platelets  Date Value Ref Range Status  06/28/2019 152 150 - 450 x10E3/uL Final         Passed - Valid encounter within last 12 months    Recent Outpatient Visits          3 months ago Routine general medical examination at a health care facility   [facility], [place] P, DO   4 months ago Essential hypertension   [place], [place], DO   5 months ago Essential hypertension   [place], [place], DO   6 months ago Essential hypertension   [place], [place], DO   7 months ago Essential hypertension   [place], [place], DO      Future Appointments            In 2 months Johnson, Megan P, DO [facility], [place]   In 8 months  [facility], [facility]

## 2019-10-15 NOTE — Telephone Encounter (Signed)
Requested Prescriptions  Pending Prescriptions Disp Refills  . XARELTO 20 MG TABS tablet [Pharmacy Med Name: XARELTO 20 MG Tablet] 90 tablet 3    Sig: TAKE 1 TABLET (20 MG TOTAL) BY MOUTH DAILY.     Hematology: Anticoagulants - rivaroxaban Passed - 10/15/2019  1:48 PM      Passed - ALT in normal range and within 180 days    ALT  Date Value Ref Range Status  06/28/2019 8 0 - 44 IU/L Final         Passed - AST in normal range and within 180 days    AST  Date Value Ref Range Status  06/28/2019 15 0 - 40 IU/L Final         Passed - Cr in normal range and within 360 days    Creatinine, Ser  Date Value Ref Range Status  06/28/2019 1.20 0.76 - 1.27 mg/dL Final         Passed - HCT in normal range and within 360 days    Hematocrit  Date Value Ref Range Status  06/28/2019 44.7 37.5 - 51.0 % Final         Passed - HGB in normal range and within 360 days    Hemoglobin  Date Value Ref Range Status  06/28/2019 14.9 13.0 - 17.7 g/dL Final         Passed - PLT in normal range and within 360 days    Platelets  Date Value Ref Range Status  06/28/2019 152 150 - 450 x10E3/uL Final         Passed - Valid encounter within last 12 months    Recent Outpatient Visits          3 months ago Routine general medical examination at a health care facility   [facility], [place] P, DO   4 months ago Essential hypertension   [place], [place], DO   5 months ago Essential hypertension   [place], [place], DO   6 months ago Essential hypertension   [place], [place], DO   7 months ago Essential hypertension   [place], [place], DO      Future Appointments            In 2 months Johnson, Megan P, DO [facility], [place]   In 8 months  [facility], [facility]

## 2019-10-18 ENCOUNTER — Telehealth: Payer: Self-pay | Admitting: General Practice

## 2019-10-18 ENCOUNTER — Ambulatory Visit (INDEPENDENT_AMBULATORY_CARE_PROVIDER_SITE_OTHER): Payer: Medicare HMO | Admitting: General Practice

## 2019-10-18 DIAGNOSIS — I10 Essential (primary) hypertension: Secondary | ICD-10-CM

## 2019-10-18 DIAGNOSIS — G20 Parkinson's disease: Secondary | ICD-10-CM

## 2019-10-18 DIAGNOSIS — H539 Unspecified visual disturbance: Secondary | ICD-10-CM | POA: Diagnosis not present

## 2019-10-18 NOTE — Patient Instructions (Signed)
Visit Information  Goals Addressed              This Visit's Progress   .  RN- I want to stay independent (pt-stated)        Current Barriers:  . Chronic Disease Management support and education needs related to Parkinson's Disease  Nurse Case Manager Clinical Goal(s):  Marland Kitchen Over the next 120 days, patient will work with [facility]  to address needs related to remaining independent   Interventions:  . Discussed plans with patient for ongoing care management follow up and provided patient with direct contact information for care management team . Evaluated the use of the bed rail.  The patient states this is very helpful for him and he is glad he has this to use- completed  . Assessed new safety concerns with the patient.  The patient uses a "hemi-walker" in the am for about 30 minutes before attempting to ambulate without assistance.  The patient feels safe in his home environment. The patient is having some difficulty with getting up and down when sitting and moves to the edge of the chair. The patient is doing well with this process. Inquired about a lift chair and the patient does not want to get a lift chair until he can no longer independently get up and down out of the chair. Has worked with PT. The patient is doing exercises that help with muscle strength. The patient had a fall with injury 2 to 3 weeks ago. The patient had an abrasion area. Carpet burn to his shoulder. The patients wife states it got infected and puss was coming out of it. The patient went to urgent care on Friday and has been taking an antibiotic. Has 3 more days of antibiotic left to take. Education on taking all of antibiotic. The patient was coming down the steps and missed the last step. The patients wife is monitoring the area and says it is scabbed over and healing now. 10-18-2019: the area has healed completely and the patient has not had any new falls.  . Assessed activity level and the patient is going to "[employer] and  "[employer]". The patient states he feels much better once he has had a work out. The patient praised for positive healthy habits. The patient enjoys boxing at least 3 times a week and socializing with his friends by going out for coffee. The patient is still going to do boxing.  The patient states he knows there is a change in his level of endurance. The patient states he is stuttering more and also that he is "spent out" if he tries to walk over 30 minutes. 10-18-2019: the patient still is keeping his routine. The patients sister made a surprise visit from [place] and was with the patient and his wife from 10-12-2019 to 10/16/2019.  This made him "go down hill" a little bit but he is doing better since back to his full routine. The patient is still driving and going to work out. They had a trip to [place] which was good for both the patient and his wife.  . The patient has consultation appointment on 10-27-2019 with eye doctor. Will have right eye cataract removal on 11-08-2019. Will have his other eye done on 12-06-2019. The wife and the patient are happy that he will not have to go off of his medications to have the surgery.  . Evaluation of upcoming appointments: Sees Dr. [name] on 12-27-2019  Patient Self Care Activities:  .  Attends all scheduled provider appointments . Performs ADL's independently . Patient wanting to stay independent as long as possible  Please see past updates related to this goal by clicking on the "Past Updates" button in the selected goal      .  RNCM: I am taking my blood pressure 2 times a day.  I have a new pill I am taking for my blood pressure        Current Barriers:  . Chronic Disease Management support and education needs related to HTN and HLD   Nurse Case Manager Clinical Goal(s):  Marland Kitchen Over the next 120 days, patient will verbalize understanding of plan for HTN and HLD . Over the next 120 days, patient will attend all scheduled medical appointments: next appointment  scheduled colonoscopy on 07-26-2019- completed  . Over the next 120 days, patient will demonstrate improved adherence to prescribed treatment plan for HTN and HLD as evidenced by continuing to monitor blood pressure BID, and adherence to a low sodium, heart healthy diet . Over the next 120 days, patient will demonstrate improved health management independence as evidenced by continuing to independently maintain health and well being with the assistance of his spouse when needed . Over the next 90 days, patient will work with CM team pharmacist to effectively manage medications and monitor for any issues such as orthostatic hypotension related to the patient taking amlodipine 2.5mg    Interventions:  . Evaluation of current treatment plan related to HTN and HLD and patient's adherence to plan as established by provider. . Provided education to patient re: orthostatic hypotension, changing position slowly and safety concerns in the patient with multiple chronic disease processes . Discussed plans with patient for ongoing care management follow up and provided patient with direct contact information for care management team . Advised patient, providing education and rationale, to monitor blood pressure daily and record, calling pcp for findings outside established parameters.  . Reviewed scheduled/upcoming provider appointments including: CCM team and scheduled colonoscopy on 07/26/2019. Patient states that there was an abnormal "colon guard" result so he is following up with a colonoscopy. Per the patients wife this did not go well and the patient will not have another colonoscopy.  There were some polyps removed but the patient had to do the prep x 2 and it really took a log out of him. They have decided no more colonoscopies.  . The patient verbalized that he is still having issues with blood pressure fluctuations. This is no new issue and is known by pcp.  He denies dizziness or light headiness when his  pressure drops in the 69'S or 85'I Systolic. He is keeping a good record of his blood pressure readings and has parameters to follow.  His wife verbalized she has salty snacks for him to eat when it drops really low. The patient verbalized he thinks this is due to the progression of his parkinson's. 10-18-2019: the patient feels overall he is doing well with his health. He has noticed that the medications seems to be wearing out sooner. The patient will talk to Dr. Theda Sers concerning this.  . Pharmacy referral for medication management and follow up for any new concerns related to addition of amlodipine to the current treatment regimen. Ongoing support and help with medication management  . Assessed patients activity level. He is going to [employer] and [employer] and says he feels better once he has had a work out session.   Patient Self Care Activities:  .  Patient verbalizes understanding of plan to manage HTN and HLD . Attends all scheduled provider appointments . Calls provider office for new concerns or questions . Unable to independently manage chronic conditions of HTN and HLD  Please see past updates related to this goal by clicking on the "Past Updates" button in the selected goal      .  RNCM: pt's wife: "I need information on a support to help with Greg's head tilting." (pt-stated)        CARE PLAN ENTRY (see longitudinal plan of care for additional care plan information)  Current Barriers:  Marland Kitchen Knowledge Deficits related to head supports for patient with Parkinson's with new onset of head tilt to the right.  . Care Coordination needs related to support devices  in a patient with Parkinson's  (disease states) . Chronic Disease Management support and education needs related to Parkinson's disease and progression and symptom management   Nurse Case Manager Clinical Goal(s):  Marland Kitchen Over the next 120 days, patient will work with [facility], CCM team and pcp  to address needs related to support devices to  use in the patient with head tilt to the right related to progression of Parkinson's  . Over the next 120 days, patient will demonstrate a decrease in lack of head control exacerbations as evidenced by having support devices that will assist with health and wellness  Interventions:  . Inter-disciplinary care team collaboration (see longitudinal plan of care) . Evaluation of current treatment plan related to Parkinson's Disease and patient's adherence to plan as established by provider. . Advised patient to look for email correspondence related to supports to help with neck. Done  . Provided education to patient re: current available information on neck supports. The patients wife verbalized the support needs to be flat in the back. A travel pillow does not work well for the patient.  The wife is wanting help the patient find something that will support his neck and shoulders. Education and support gathered and sent to the patients wife by email at [email or web address].  Also sent information by the [facility] system. Will continue to research and send related information to the patient and patients wife. Per the patients wife, an elderly friend from church has taken the travel pillow and shaved some of the stuffing from the back and this is helping the patient much better. They are concerned that some of the devices used to support the head are bulky and make the patient look like he is an invalid. The patient wants to remain as independent as possible.  Nash Dimmer with RNCM colleagues and pcp regarding support devices for head tilt in patient with Parkinson's.  The pcp recommended the patient and wife reaching out to the neurologist for specific help with recommendations for neck support. 10-18-2019: the patient is doing well with his care and the support device he is using is beneficial at this time.  . Discussed plans with patient for ongoing care management follow up and provided patient with direct  contact information for care management team . Provided patient with Parkinson's  educational materials related to activities, supports to use in home and other resources for better understanding of Parkinson's and the disease progression.   Patient Self Care Activities:  . Patient verbalizes understanding of plan to receive information by email on support devices for head tilt in patient with Parkinson's  . Calls provider office for new concerns or questions . Unable to independently control tilting of head to the right  in a patient with progression of Parkinson's   Please see past updates related to this goal by clicking on the "Past Updates" button in the selected goal         Patient verbalizes understanding of instructions provided today.   Telephone follow up appointment with care management team member scheduled for:12-30-2019 at 4 am  [place], MSN, [facility] Mobile: [phone]

## 2019-10-18 NOTE — Chronic Care Management (AMB) (Signed)
Chronic Care Management   Follow Up Note   10/18/2019 Name: Thomas [name] MRN: [id number] DOB: March 14, 1951  Referred by: [name], DO Reason for referral : Chronic Care Management (Follow up: RNCM Chronic Disease Management and Care Coordination)   Thomas [name] is a 69 y.o. year old male who is a primary care patient of [name], DO. The CCM team was consulted for assistance with chronic disease management and care coordination needs.    Review of patient status, including review of consultants reports, relevant laboratory and other test results, and collaboration with appropriate care team members and the patient's provider was performed as part of comprehensive patient evaluation and provision of chronic care management services.    SDOH (Social Determinants of Health) assessments performed: Yes See Care Plan activities for detailed interventions related to [facility])     Outpatient Encounter Medications as of 10/18/2019  Medication Sig  . acetaminophen (TYLENOL) 325 MG tablet Take 325 mg by mouth every 6 (six) hours as needed.  Marland Kitchen amantadine (SYMMETREL) 100 MG capsule TAKE 1 CAPSULE THREE TIMES DAILY  . amLODipine (NORVASC) 2.5 MG tablet Take 0.5 tablets (1.25 mg total) by mouth daily.  . carbidopa-levodopa (SINEMET CR) 50-200 MG tablet TAKE 1 TABLET AT BEDTIME  . carbidopa-levodopa (SINEMET IR) 25-100 MG tablet TAKE 2 TABLETS AT 6AM, 10AM, AND 2PM, AND TAKE 1 TABLET AT 6PM AS DIRECTED.  Marland Kitchen cetirizine (ZYRTEC) 10 MG tablet Take 10 mg by mouth daily.  . cholecalciferol (VITAMIN D3) 25 MCG (1000 UNIT) tablet Take 1,000 Units by mouth daily.  . diclofenac Sodium (VOLTAREN) 1 % GEL APPLY TOPICALLY 4 GRAMS FOUR TIMES DAILY  . lisinopril (ZESTRIL) 30 MG tablet TAKE 1 TABLET EVERY DAY  . Melatonin 2.5 MG CAPS Take 1 capsule by mouth at bedtime as needed (for sleep).   . metoprolol succinate (TOPROL-XL) 25 MG 24 hr tablet TAKE 1 TABLET EVERY DAY  . Multiple Vitamin (MULTIVITAMIN)  tablet Take 1 tablet by mouth daily.  Marland Kitchen PREVIDENT 5000 BOOSTER PLUS 1.1 % PSTE See admin instructions.  Marland Kitchen rOPINIRole (REQUIP) 2 MG tablet TAKE 1 TABLET THREE TIMES DAILY  . sildenafil (REVATIO) 20 MG tablet Take 1 tablet (20 mg total) by mouth 3 (three) times daily as needed.  . simvastatin (ZOCOR) 40 MG tablet Take 1 tablet (40 mg total) by mouth daily.  . tamsulosin (FLOMAX) 0.4 MG CAPS capsule Take 1 capsule (0.4 mg total) by mouth daily.  . traZODone (DESYREL) 50 MG tablet Take 0.5-1 tablets (25-50 mg total) by mouth at bedtime as needed for sleep.  Alveda Reasons 20 MG TABS tablet TAKE 1 TABLET (20 MG TOTAL) BY MOUTH DAILY.   No facility-administered encounter medications on file as of 10/18/2019.     Objective:  BP Readings from Last 3 Encounters:  07/27/19 (!) 194/106  07/06/19 116/70  07/01/19 128/88    Goals Addressed              This Visit's Progress   .  RN- I want to stay independent (pt-stated)        Current Barriers:  . Chronic Disease Management support and education needs related to Parkinson's Disease  Nurse Case Manager Clinical Goal(s):  Marland Kitchen Over the next 120 days, patient will work with [facility]  to address needs related to remaining independent   Interventions:  . Discussed plans with patient for ongoing care management follow up and provided patient with direct contact information for care management team . Evaluated the  use of the bed rail.  The patient states this is very helpful for him and he is glad he has this to use- completed  . Assessed new safety concerns with the patient.  The patient uses a "hemi-walker" in the am for about 30 minutes before attempting to ambulate without assistance.  The patient feels safe in his home environment. The patient is having some difficulty with getting up and down when sitting and moves to the edge of the chair. The patient is doing well with this process. Inquired about a lift chair and the patient does not want to get a lift  chair until he can no longer independently get up and down out of the chair. Has worked with PT. The patient is doing exercises that help with muscle strength. The patient had a fall with injury 2 to 3 weeks ago. The patient had an abrasion area. Carpet burn to his shoulder. The patients wife states it got infected and puss was coming out of it. The patient went to urgent care on Friday and has been taking an antibiotic. Has 3 more days of antibiotic left to take. Education on taking all of antibiotic. The patient was coming down the steps and missed the last step. The patients wife is monitoring the area and says it is scabbed over and healing now. 10-18-2019: the area has healed completely and the patient has not had any new falls.  . Assessed activity level and the patient is going to "[employer] and "[employer]". The patient states he feels much better once he has had a work out. The patient praised for positive healthy habits. The patient enjoys boxing at least 3 times a week and socializing with his friends by going out for coffee. The patient is still going to do boxing.  The patient states he knows there is a change in his level of endurance. The patient states he is stuttering more and also that he is "spent out" if he tries to walk over 30 minutes. 10-18-2019: the patient still is keeping his routine. The patients sister made a surprise visit from [place] and was with the patient and his wife from 10-12-2019 to 10/16/2019.  This made him "go down hill" a little bit but he is doing better since back to his full routine. The patient is still driving and going to work out. They had a trip to [place] which was good for both the patient and his wife.  . The patient has consultation appointment on 10-27-2019 with eye doctor. Will have right eye cataract removal on 11-08-2019. Will have his other eye done on 12-06-2019. The wife and the patient are happy that he will not have to go off of his medications to have the  surgery.  . Evaluation of upcoming appointments: Sees Dr. [name] on 12-27-2019  Patient Self Care Activities:  . Attends all scheduled provider appointments . Performs ADL's independently . Patient wanting to stay independent as long as possible  Please see past updates related to this goal by clicking on the "Past Updates" button in the selected goal      .  RNCM: I am taking my blood pressure 2 times a day.  I have a new pill I am taking for my blood pressure        Current Barriers:  . Chronic Disease Management support and education needs related to HTN and HLD   Nurse Case Manager Clinical Goal(s):  Marland Kitchen Over the next 120 days, patient  will verbalize understanding of plan for HTN and HLD . Over the next 120 days, patient will attend all scheduled medical appointments: next appointment scheduled colonoscopy on 07-26-2019- completed  . Over the next 120 days, patient will demonstrate improved adherence to prescribed treatment plan for HTN and HLD as evidenced by continuing to monitor blood pressure BID, and adherence to a low sodium, heart healthy diet . Over the next 120 days, patient will demonstrate improved health management independence as evidenced by continuing to independently maintain health and well being with the assistance of his spouse when needed . Over the next 90 days, patient will work with CM team pharmacist to effectively manage medications and monitor for any issues such as orthostatic hypotension related to the patient taking amlodipine 2.5mg    Interventions:  . Evaluation of current treatment plan related to HTN and HLD and patient's adherence to plan as established by provider. . Provided education to patient re: orthostatic hypotension, changing position slowly and safety concerns in the patient with multiple chronic disease processes . Discussed plans with patient for ongoing care management follow up and provided patient with direct contact information for care  management team . Advised patient, providing education and rationale, to monitor blood pressure daily and record, calling pcp for findings outside established parameters.  . Reviewed scheduled/upcoming provider appointments including: CCM team and scheduled colonoscopy on 07/26/2019. Patient states that there was an abnormal "colon guard" result so he is following up with a colonoscopy. Per the patients wife this did not go well and the patient will not have another colonoscopy.  There were some polyps removed but the patient had to do the prep x 2 and it really took a log out of him. They have decided no more colonoscopies.  . The patient verbalized that he is still having issues with blood pressure fluctuations. This is no new issue and is known by pcp.  He denies dizziness or light headiness when his pressure drops in the 95'A or 21'H Systolic. He is keeping a good record of his blood pressure readings and has parameters to follow.  His wife verbalized she has salty snacks for him to eat when it drops really low. The patient verbalized he thinks this is due to the progression of his parkinson's. 10-18-2019: the patient feels overall he is doing well with his health. He has noticed that the medications seems to be wearing out sooner. The patient will talk to Dr. Theda Sers concerning this.  . Pharmacy referral for medication management and follow up for any new concerns related to addition of amlodipine to the current treatment regimen. Ongoing support and help with medication management  . Assessed patients activity level. He is going to [employer] and [employer] and says he feels better once he has had a work out session.   Patient Self Care Activities:  . Patient verbalizes understanding of plan to manage HTN and HLD . Attends all scheduled provider appointments . Calls provider office for new concerns or questions . Unable to independently manage chronic conditions of HTN and HLD  Please see past updates  related to this goal by clicking on the "Past Updates" button in the selected goal      .  RNCM: pt's wife: "I need information on a support to help with Thomas [name]'s head tilting." (pt-stated)        CARE PLAN ENTRY (see longitudinal plan of care for additional care plan information)  Current Barriers:  Marland Kitchen Knowledge Deficits related to  head supports for patient with Parkinson's with new onset of head tilt to the right.  . Care Coordination needs related to support devices  in a patient with Parkinson's  (disease states) . Chronic Disease Management support and education needs related to Parkinson's disease and progression and symptom management   Nurse Case Manager Clinical Goal(s):  Marland Kitchen Over the next 120 days, patient will work with [facility], CCM team and pcp  to address needs related to support devices to use in the patient with head tilt to the right related to progression of Parkinson's  . Over the next 120 days, patient will demonstrate a decrease in lack of head control exacerbations as evidenced by having support devices that will assist with health and wellness  Interventions:  . Inter-disciplinary care team collaboration (see longitudinal plan of care) . Evaluation of current treatment plan related to Parkinson's Disease and patient's adherence to plan as established by provider. . Advised patient to look for email correspondence related to supports to help with neck. Done  . Provided education to patient re: current available information on neck supports. The patients wife verbalized the support needs to be flat in the back. A travel pillow does not work well for the patient.  The wife is wanting help the patient find something that will support his neck and shoulders. Education and support gathered and sent to the patients wife by email at [email or web address].  Also sent information by the [facility] system. Will continue to research and send related information to the patient and patients wife. Per  the patients wife, an elderly friend from church has taken the travel pillow and shaved some of the stuffing from the back and this is helping the patient much better. They are concerned that some of the devices used to support the head are bulky and make the patient look like he is an invalid. The patient wants to remain as independent as possible.  Nash Dimmer with RNCM colleagues and pcp regarding support devices for head tilt in patient with Parkinson's.  The pcp recommended the patient and wife reaching out to the neurologist for specific help with recommendations for neck support. 10-18-2019: the patient is doing well with his care and the support device he is using is beneficial at this time.  . Discussed plans with patient for ongoing care management follow up and provided patient with direct contact information for care management team . Provided patient with Parkinson's  educational materials related to activities, supports to use in home and other resources for better understanding of Parkinson's and the disease progression.   Patient Self Care Activities:  . Patient verbalizes understanding of plan to receive information by email on support devices for head tilt in patient with Parkinson's  . Calls provider office for new concerns or questions . Unable to independently control tilting of head to the right in a patient with progression of Parkinson's   Please see past updates related to this goal by clicking on the "Past Updates" button in the selected goal          Plan:   Telephone follow up appointment with care management team member scheduled for: 12-30-2019 at 10 am   [place], MSN, [facility] Mobile: [phone]

## 2019-10-26 DIAGNOSIS — H43813 Vitreous degeneration, bilateral: Secondary | ICD-10-CM | POA: Diagnosis not present

## 2019-10-26 DIAGNOSIS — H25013 Cortical age-related cataract, bilateral: Secondary | ICD-10-CM | POA: Diagnosis not present

## 2019-10-26 DIAGNOSIS — H2513 Age-related nuclear cataract, bilateral: Secondary | ICD-10-CM | POA: Diagnosis not present

## 2019-10-26 DIAGNOSIS — H5703 Miosis: Secondary | ICD-10-CM | POA: Diagnosis not present

## 2019-10-26 DIAGNOSIS — H2512 Age-related nuclear cataract, left eye: Secondary | ICD-10-CM | POA: Diagnosis not present

## 2019-11-08 DIAGNOSIS — H25812 Combined forms of age-related cataract, left eye: Secondary | ICD-10-CM | POA: Diagnosis not present

## 2019-11-08 DIAGNOSIS — H2512 Age-related nuclear cataract, left eye: Secondary | ICD-10-CM | POA: Diagnosis not present

## 2019-11-10 ENCOUNTER — Other Ambulatory Visit: Payer: Self-pay | Admitting: Family Medicine

## 2019-11-15 ENCOUNTER — Other Ambulatory Visit: Payer: Self-pay | Admitting: Neurology

## 2019-11-15 NOTE — Telephone Encounter (Signed)
Rx(s) sent to pharmacy electronically.  

## 2019-11-17 DIAGNOSIS — H2512 Age-related nuclear cataract, left eye: Secondary | ICD-10-CM | POA: Diagnosis not present

## 2019-11-28 ENCOUNTER — Other Ambulatory Visit: Payer: Self-pay | Admitting: Family Medicine

## 2019-11-30 DIAGNOSIS — H25011 Cortical age-related cataract, right eye: Secondary | ICD-10-CM | POA: Diagnosis not present

## 2019-11-30 DIAGNOSIS — H2511 Age-related nuclear cataract, right eye: Secondary | ICD-10-CM | POA: Diagnosis not present

## 2019-12-06 DIAGNOSIS — H2511 Age-related nuclear cataract, right eye: Secondary | ICD-10-CM | POA: Diagnosis not present

## 2019-12-06 DIAGNOSIS — H25811 Combined forms of age-related cataract, right eye: Secondary | ICD-10-CM | POA: Diagnosis not present

## 2019-12-06 DIAGNOSIS — H25011 Cortical age-related cataract, right eye: Secondary | ICD-10-CM | POA: Diagnosis not present

## 2019-12-08 ENCOUNTER — Other Ambulatory Visit: Payer: Self-pay | Admitting: Neurology

## 2019-12-08 NOTE — Telephone Encounter (Signed)
Rx(s) sent to pharmacy electronically.  

## 2019-12-09 NOTE — Progress Notes (Signed)
Assessment/Plan:   1.  Parkinsons Disease  -Continue ropinirole, 2 mg 3 times per day  -Continue carbidopa/levodopa 25/100, 2 tablets at 6 AM/10 AM/2 PM/6 PM  -Continue carbidopa/levodopa 50/200 CR at bedtime  -discussed LSVT LOUD.  They want to hold for now.  2.  Parkinson's dyskinesia  -Continue amantadine, 100 mg 3 times daily  3.  RBD  -Discussed safety.  Declines medication right now.  4.  MCI  -Neurocognitive test in August, 2020 without evidence of dementia.  Following.  5.  Neurogenic Orthostatic Hypotension  -likely due to BP meds in combination with Parkinsons Disease.  He is on low dose of meds and wonder if he needs these meds.  He has appt with PCP soon and they will discuss  6.  Constipation  -discussed nature and pathophysiology and association with PD  -discussed importance of hydration.  Pt is to increase water intake  -pt is given a copy of the rancho recipe  -recommended daily colace  -recommended miralax qd to bid    Subjective:   Thomas [name] was seen today in follow up for Parkinsons disease.  My previous records were reviewed prior to todays visit as well as outside records available to me. Pt had one fall since last visit coming down stairs and missed the last step.   No other falls.  Still doing RSB 3 days a week and going to gym 1 day per week.   Pt with lightheadedness in the AM at first.  BP in the 90's when first wakes up.  wonders if needs so much BP meds and has f/u with PCP in few weeks.  no near syncope.  No hallucinations.  Mood has been good.  Much better mood with getting back to boxing.   Noting some word finding trouble and trouble with speech.  Noting constipation  Current prescribed movement disorder medications: Ropinirole, 2 mg 3 times per day Carbidopa/levodopa 25/100, 2 tablets at 6 AM/2 tablets at 10 AM/2 tablets at 2 PM/2 tablets at 6 PM Amantadine 100 mg 3 times per day carbidopa/levodopa 50/200 CR q hs   ALLERGIES:     Allergies  Allergen Reactions  . Codeine Other (See Comments)    Reaction: Hallucinations    CURRENT MEDICATIONS:  Outpatient Encounter Medications as of 12/13/2019  Medication Sig  . acetaminophen (TYLENOL) 325 MG tablet Take 325 mg by mouth every 6 (six) hours as needed.  Marland Kitchen amantadine (SYMMETREL) 100 MG capsule TAKE 1 CAPSULE THREE TIMES DAILY  . amLODipine (NORVASC) 2.5 MG tablet Take 0.5 tablets (1.25 mg total) by mouth daily.  . carbidopa-levodopa (SINEMET CR) 50-200 MG tablet TAKE 1 TABLET AT BEDTIME  . carbidopa-levodopa (SINEMET IR) 25-100 MG tablet TAKE 2 TABLETS AT 6AM, 10AM, AND 2PM, AND TAKE 1 TABLET AT 6PM AS DIRECTED. Can take extra 1/2 tablet-1 tablet prn  . cetirizine (ZYRTEC) 10 MG tablet Take 10 mg by mouth daily.  . cholecalciferol (VITAMIN D3) 25 MCG (1000 UNIT) tablet Take 1,000 Units by mouth daily.  . diclofenac Sodium (VOLTAREN) 1 % GEL APPLY TOPICALLY 4 GRAMS FOUR TIMES DAILY  . lisinopril (ZESTRIL) 30 MG tablet TAKE 1 TABLET EVERY DAY  . Melatonin 2.5 MG CAPS Take 1 capsule by mouth at bedtime as needed (for sleep).   . metoprolol succinate (TOPROL-XL) 25 MG 24 hr tablet TAKE 1 TABLET EVERY DAY  . Multiple Vitamin (MULTIVITAMIN) tablet Take 1 tablet by mouth daily.  Marland Kitchen PREVIDENT 5000 BOOSTER PLUS 1.1 % PSTE  See admin instructions.  Marland Kitchen rOPINIRole (REQUIP) 2 MG tablet TAKE 1 TABLET THREE TIMES DAILY  . sildenafil (REVATIO) 20 MG tablet Take 1 tablet (20 mg total) by mouth 3 (three) times daily as needed.  . simvastatin (ZOCOR) 40 MG tablet Take 1 tablet (40 mg total) by mouth daily.  . tamsulosin (FLOMAX) 0.4 MG CAPS capsule TAKE 1 CAPSULE EVERY DAY  . traZODone (DESYREL) 50 MG tablet TAKE 1/2 TO 1 TABLET AT BEDTIME AS NEEDED FOR SLEEP  . XARELTO 20 MG TABS tablet TAKE 1 TABLET (20 MG TOTAL) BY MOUTH DAILY.   No facility-administered encounter medications on file as of 12/13/2019.    Objective:   PHYSICAL EXAMINATION:    VITALS:   Vitals:   12/13/19 1048   BP: 111/68  Pulse: 63  Resp: 20  SpO2: 98%  Weight: 230 lb (104.3 kg)  Height: 5\' 10"  (1.778 m)   Wt Readings from Last 3 Encounters:  12/13/19 230 lb (104.3 kg)  07/27/19 240 lb (108.9 kg)  07/01/19 247 lb (112 kg)     GEN:  The patient appears stated age and is in NAD. HEENT:  Normocephalic, atraumatic.  The mucous membranes are moist. The superficial temporal arteries are without ropiness or tenderness. CV:  RRR Lungs:  CTAB Neck/HEME:  There are no carotid bruits bilaterally.  Neurological examination:  Orientation: The patient is alert and oriented x3. Cranial nerves: There is good facial symmetry with mild facial hypomimia. The speech is fluent and clear. Soft palate rises symmetrically and there is no tongue deviation. Hearing is intact to conversational tone. Sensation: Sensation is intact to light touch throughout Motor: Strength is at least antigravity x4.  Movement examination: Tone: There is mild increased tone in the RUE Abnormal movements:  mild dyskinesia in the right shoulder Coordination:  There is mild decremation with RAM's, with any form of RAMS, including alternating supination and pronation of the forearm, hand opening and closing, finger taps, heel taps and toe taps, R>L Gait and Station: The patient has no difficulty arising out of a deep-seated chair without the use of the hands. The patient's stride length is mildly decreased and slightly forward flexed.      I have reviewed and interpreted the following labs independently    Chemistry      Component Value Date/Time   NA 140 06/28/2019 1048   K 4.8 06/28/2019 1048   CL 105 06/28/2019 1048   CO2 21 06/28/2019 1048   BUN 18 06/28/2019 1048   CREATININE 1.20 06/28/2019 1048      Component Value Date/Time   CALCIUM 9.3 06/28/2019 1048   ALKPHOS 89 06/28/2019 1048   AST 15 06/28/2019 1048   ALT 8 06/28/2019 1048   BILITOT 0.6 06/28/2019 1048       Lab Results  Component Value Date   WBC  6.0 06/28/2019   HGB 14.9 06/28/2019   HCT 44.7 06/28/2019   MCV 92 06/28/2019   PLT 152 06/28/2019    Lab Results  Component Value Date   TSH 1.640 06/28/2019     Total time spent on today's visit was 30 minutes, including both face-to-face time and nonface-to-face time.  Time included that spent on review of records (prior notes available to me/labs/imaging if pertinent), discussing treatment and goals, answering patient's questions and coordinating care.  Cc:  [name], DO

## 2019-12-13 ENCOUNTER — Ambulatory Visit: Payer: Medicare HMO | Admitting: Neurology

## 2019-12-13 ENCOUNTER — Encounter: Payer: Self-pay | Admitting: Neurology

## 2019-12-13 ENCOUNTER — Other Ambulatory Visit: Payer: Self-pay

## 2019-12-13 VITALS — BP 111/68 | HR 63 | Resp 20 | Ht 70.0 in | Wt 230.0 lb

## 2019-12-13 DIAGNOSIS — G20 Parkinson's disease: Secondary | ICD-10-CM | POA: Diagnosis not present

## 2019-12-13 DIAGNOSIS — G249 Dystonia, unspecified: Secondary | ICD-10-CM | POA: Diagnosis not present

## 2019-12-13 NOTE — Patient Instructions (Addendum)
Constipation and Parkinson's disease:  1.Rancho recipe for constipation in Parkinsons Disease:  -1 cup of unprocessed bran (need to get this at [employer], [employer] or similar type of store), 2 cups of applesauce in 1 cup of prune juice 2.  Increase fiber intake (Metamucil,vegetables) 3.  Regular, moderate exercise can be beneficial.  4.  Avoid medications causing constipation, such as medications like antacids with calcium or magnesium 5.  It's okay to take daily Miralax and even twice per day, and taper if stools become too loose or you experience diarrhea 6.  Stool softeners (Colace) can help with chronic constipation and I recommend you take this daily. 7.  Increase water intake.  You should be drinking 1/2 gallon of water a day as long as you have not been diagnosed with congestive heart failure or renal/kidney failure.  This is probably the single greatest thing that you can do to help your constipation.  The physicians and staff at [facility] are committed to providing excellent care. You may receive a survey requesting feedback about your experience at our office. We strive to receive "very good" responses to the survey questions. If you feel that your experience would prevent you from giving the office a "very good " response, please contact our office to try to remedy the situation. We may be reached at [phone]. Thank you for taking the time out of your busy day to complete the survey.

## 2019-12-21 DIAGNOSIS — H2511 Age-related nuclear cataract, right eye: Secondary | ICD-10-CM | POA: Diagnosis not present

## 2019-12-27 ENCOUNTER — Encounter: Payer: Self-pay | Admitting: Family Medicine

## 2019-12-27 ENCOUNTER — Other Ambulatory Visit: Payer: Self-pay | Admitting: Family Medicine

## 2019-12-27 ENCOUNTER — Ambulatory Visit (INDEPENDENT_AMBULATORY_CARE_PROVIDER_SITE_OTHER): Payer: Medicare HMO | Admitting: Family Medicine

## 2019-12-27 ENCOUNTER — Other Ambulatory Visit: Payer: Self-pay

## 2019-12-27 VITALS — BP 120/77 | HR 53 | Temp 97.7°F | Ht 70.0 in | Wt 229.0 lb

## 2019-12-27 DIAGNOSIS — E7800 Pure hypercholesterolemia, unspecified: Secondary | ICD-10-CM

## 2019-12-27 DIAGNOSIS — I10 Essential (primary) hypertension: Secondary | ICD-10-CM | POA: Diagnosis not present

## 2019-12-27 DIAGNOSIS — Z23 Encounter for immunization: Secondary | ICD-10-CM | POA: Diagnosis not present

## 2019-12-27 MED ORDER — DICLOFENAC SODIUM 1 % EX GEL: CUTANEOUS | 0 refills | Status: DC

## 2019-12-27 MED ORDER — LISINOPRIL 30 MG PO TABS: 30.0000 mg | ORAL_TABLET | Freq: Every day | ORAL | 1 refills | Status: DC

## 2019-12-27 MED ORDER — AMLODIPINE BESYLATE 2.5 MG PO TABS: 1.2500 mg | ORAL_TABLET | Freq: Every day | ORAL | 1 refills | Status: DC | PRN

## 2019-12-27 MED ORDER — SILDENAFIL CITRATE 20 MG PO TABS
20.0000 mg | ORAL_TABLET | Freq: Three times a day (TID) | ORAL | 6 refills | Status: DC | PRN
Start: 2019-12-27 — End: 2019-12-29

## 2019-12-27 MED ORDER — METOPROLOL SUCCINATE ER 25 MG PO TB24
25.0000 mg | ORAL_TABLET | Freq: Every day | ORAL | 1 refills | Status: DC
Start: 2019-12-27 — End: 2020-07-10

## 2019-12-27 MED ORDER — TRAZODONE HCL 50 MG PO TABS
ORAL_TABLET | ORAL | 0 refills | Status: DC
Start: 2019-12-27 — End: 2020-04-26

## 2019-12-27 NOTE — Assessment & Plan Note (Signed)
Running low. Will only take amlodipine if BP>160- more concerned about lows than a couple of highs. Continue to monitor. Call with any concerns.

## 2019-12-27 NOTE — Assessment & Plan Note (Signed)
Under good control on current regimen. Continue current regimen. Continue to monitor. Call with any concerns. Refills given. Labs drawn today.   

## 2019-12-27 NOTE — Telephone Encounter (Signed)
Requested Prescriptions  Pending Prescriptions Disp Refills   simvastatin (ZOCOR) 40 MG tablet [Pharmacy Med Name: SIMVASTATIN 40 MG Tablet] 90 tablet 1    Sig: TAKE 1 TABLET EVERY DAY     Cardiovascular:  Antilipid - Statins Failed - 12/27/2019 12:27 AM      Failed - LDL in normal range and within 360 days    LDL Chol Calc (NIH)  Date Value Ref Range Status  06/28/2019 66 0 - 99 mg/dL Final         Passed - Total Cholesterol in normal range and within 360 days    Cholesterol, Total  Date Value Ref Range Status  06/28/2019 132 100 - 199 mg/dL Final   Cholesterol Piccolo, Waived  Date Value Ref Range Status  03/01/2015 136 <200 mg/dL Final    Comment:                            Desirable                <200                         Borderline High      200- 239                         High                     >239          Passed - HDL in normal range and within 360 days    HDL  Date Value Ref Range Status  06/28/2019 50 >39 mg/dL Final         Passed - Triglycerides in normal range and within 360 days    Triglycerides  Date Value Ref Range Status  06/28/2019 82 0 - 149 mg/dL Final   Triglycerides Piccolo,Waived  Date Value Ref Range Status  03/01/2015 74 <150 mg/dL Final    Comment:                            Normal                   <150                         Borderline High     150 - 199                         High                200 - 499                         Very High                >499          Passed - Patient is not pregnant      Passed - Valid encounter within last 12 months    Recent Outpatient Visits          6 months ago Routine general medical examination at a health care facility   [facility], [place] P, DO   7 months ago Essential hypertension   [place], [place],  DO   7 months ago Essential hypertension   [place], [place], DO   9 months ago Essential hypertension    [place], [place], DO   9 months ago Essential hypertension   [place], [place], DO      Future Appointments            Today [name], Barb Merino, DO [place], [facility]   In 1 month Goll[place], Kathlene November, MD [place], [facility]   In 5 months  [employer], [place]

## 2019-12-27 NOTE — Progress Notes (Signed)
BP 120/77   Pulse (!) 53   Temp 97.7 F (36.5 C) (Oral)   Ht 5\' 10"  (1.778 m)   Wt 229 lb (103.9 kg)   SpO2 99%   BMI 32.86 kg/m    Subjective:    Patient ID: Thomas [name], male    DOB: 1950/05/17, 69 y.o.   MRN: [id number]  HPI: Thomas [name] is a 68 y.o. male  Chief Complaint  Patient presents with  . Hypertension    follow up - pt has been getting low readings in the mornings  . Hyperlipidemia  . Medication Problem    amlodipine  . Flu Vaccine    completed   HYPERTENSION / HYPERLIPIDEMIA- has been getting down into the 80s when he takes the amlodipine has had a couple of BPs in the 160s Satisfied with current treatment? no Duration of hypertension: chronic BP monitoring frequency: a few times a day BP medication side effects: no Past BP meds: lisinopril, metoprolol, amlodipine Duration of hyperlipidemia: chronic Cholesterol medication side effects: no Cholesterol supplements: none Past cholesterol medications: simvastatin Medication compliance: excellent compliance Aspirin: no Recent stressors: no Recurrent headaches: no Visual changes: no Palpitations: no Dyspnea: no Chest pain: no Lower extremity edema: no Dizzy/lightheaded: no  Relevant past medical, surgical, family and social history reviewed and updated as indicated. Interim medical history since our last visit reviewed. Allergies and medications reviewed and updated.  Review of Systems  Constitutional: Negative.   Respiratory: Negative.   Cardiovascular: Negative.   Gastrointestinal: Negative.   Musculoskeletal: Negative.   Neurological: Positive for tremors, speech difficulty and weakness. Negative for dizziness, seizures, syncope, facial asymmetry, light-headedness, numbness and headaches.  Hematological: Negative.   Psychiatric/Behavioral: Negative.     Per HPI unless specifically indicated above     Objective:    BP 120/77   Pulse (!) 53   Temp 97.7 F (36.5 C) (Oral)   Ht 5\' 10"   (1.778 m)   Wt 229 lb (103.9 kg)   SpO2 99%   BMI 32.86 kg/m   Wt Readings from Last 3 Encounters:  12/27/19 229 lb (103.9 kg)  12/13/19 230 lb (104.3 kg)  07/27/19 240 lb (108.9 kg)    Physical Exam Vitals and nursing note reviewed.  Constitutional:      General: He is not in acute distress.    Appearance: Normal appearance. He is not ill-appearing, toxic-appearing or diaphoretic.  HENT:     Head: Normocephalic and atraumatic.     Right Ear: External ear normal.     Left Ear: External ear normal.     Nose: Nose normal.     Mouth/Throat:     Mouth: Mucous membranes are moist.     Pharynx: Oropharynx is clear.  Eyes:     General: No scleral icterus.       Right eye: No discharge.        Left eye: No discharge.     Extraocular Movements: Extraocular movements intact.     Conjunctiva/sclera: Conjunctivae normal.     Pupils: Pupils are equal, round, and reactive to light.  Cardiovascular:     Rate and Rhythm: Normal rate and regular rhythm.     Pulses: Normal pulses.     Heart sounds: Normal heart sounds. No murmur heard.  No friction rub. No gallop.   Pulmonary:     Effort: Pulmonary effort is normal. No respiratory distress.     Breath sounds: Normal breath sounds. No stridor. No wheezing, rhonchi or rales.  Chest:     Chest wall: No tenderness.  Musculoskeletal:        General: Normal range of motion.     Cervical back: Normal range of motion and neck supple.  Skin:    General: Skin is warm and dry.     Capillary Refill: Capillary refill takes less than 2 seconds.     Coloration: Skin is not jaundiced or pale.     Findings: No bruising, erythema, lesion or rash.  Neurological:     General: No focal deficit present.     Mental Status: He is alert and oriented to person, place, and time. Mental status is at baseline.  Psychiatric:        Mood and Affect: Mood normal.        Behavior: Behavior normal.        Thought Content: Thought content normal.        Judgment:  Judgment normal.     Results for orders placed or performed during the hospital encounter of 07/27/19  Surgical pathology  Result Value Ref Range   SURGICAL PATHOLOGY      SURGICAL PATHOLOGY CASE: [id number] PATIENT: Thomas [name] Surgical Pathology Report     Specimen Submitted: A. Colon polyp x2, transverse; cold snare  Clinical History: Positive cologuard      DIAGNOSIS: A. COLON POLYP X2, TRANSVERSE; COLD SNARE: - TISSUE NOT IDENTIFIED (FECAL MATERIAL ONLY).  Comment: Step sections are examined.  GROSS DESCRIPTION: A. Labeled: Cold snare polyp x2 transverse colon Received: Formalin Tissue fragment(s): Multiple Size: Aggregate, 2.5 x 0.6 x 0.1 cm Description: Tan soft tissue fragments admixed with fecal material Entirely submitted in 1 cassette.     Final Diagnosis performed by Betsy Pries, MD.   Electronically signed 07/29/2019 8:08:01AM The electronic signature indicates that the named Attending Pathologist has evaluated the specimen Technical component performed at [facility], [address] [place], [place] [address] Lab: [phone] Dir: Thomas [name], MD, MMM  Professional component performed at [name],  [facility], [place], [place], [place] [address] Lab: [phone] Dir: Dellia Nims. Reuel Derby, MD       Assessment & Plan:   Problem List Items Addressed This Visit      Cardiovascular and Mediastinum   Hypertension - Primary    Running low. Will only take amlodipine if BP>160- more concerned about lows than a couple of highs. Continue to monitor. Call with any concerns.       Relevant Medications   amLODipine (NORVASC) 2.5 MG tablet   lisinopril (ZESTRIL) 30 MG tablet   sildenafil (REVATIO) 20 MG tablet   metoprolol succinate (TOPROL-XL) 25 MG 24 hr tablet   Other Relevant Orders   Comprehensive metabolic panel     Other   Hypercholesterolemia    Under good control on current regimen. Continue current regimen.  Continue to monitor. Call with any concerns. Refills given. Labs drawn today.        Relevant Medications   amLODipine (NORVASC) 2.5 MG tablet   lisinopril (ZESTRIL) 30 MG tablet   sildenafil (REVATIO) 20 MG tablet   metoprolol succinate (TOPROL-XL) 25 MG 24 hr tablet   Other Relevant Orders   Comprehensive metabolic panel   Lipid Panel w/o Chol/HDL Ratio    Other Visit Diagnoses    Need for immunization against influenza       Flu shot given today.   Relevant Orders   Flu Vaccine QUAD High Dose(Fluad) (Completed)       Follow up plan:  Return in about 6 months (around 06/25/2020) for physical.

## 2019-12-28 ENCOUNTER — Encounter: Payer: Self-pay | Admitting: Family Medicine

## 2019-12-28 LAB — COMPREHENSIVE METABOLIC PANEL WITH GFR
ALT: 6 IU/L (ref 0–44)
AST: 11 IU/L (ref 0–40)
Albumin/Globulin Ratio: 1.7 (ref 1.2–2.2)
Albumin: 4.3 g/dL (ref 3.8–4.8)
Alkaline Phosphatase: 107 IU/L (ref 44–121)
BUN/Creatinine Ratio: 16 (ref 10–24)
BUN: 16 mg/dL (ref 8–27)
Bilirubin Total: 0.7 mg/dL (ref 0.0–1.2)
CO2: 22 mmol/L (ref 20–29)
Calcium: 9.3 mg/dL (ref 8.6–10.2)
Chloride: 106 mmol/L (ref 96–106)
Creatinine, Ser: 0.97 mg/dL (ref 0.76–1.27)
GFR calc Af Amer: 91 mL/min/1.73
GFR calc non Af Amer: 79 mL/min/1.73
Globulin, Total: 2.5 g/dL (ref 1.5–4.5)
Glucose: 92 mg/dL (ref 65–99)
Potassium: 4.5 mmol/L (ref 3.5–5.2)
Sodium: 141 mmol/L (ref 134–144)
Total Protein: 6.8 g/dL (ref 6.0–8.5)

## 2019-12-28 LAB — LIPID PANEL W/O CHOL/HDL RATIO
Cholesterol, Total: 120 mg/dL (ref 100–199)
HDL: 46 mg/dL
LDL Chol Calc (NIH): 60 mg/dL (ref 0–99)
Triglycerides: 65 mg/dL (ref 0–149)
VLDL Cholesterol Cal: 14 mg/dL (ref 5–40)

## 2019-12-29 ENCOUNTER — Telehealth: Payer: Self-pay

## 2019-12-29 MED ORDER — SILDENAFIL CITRATE 20 MG PO TABS: 20.0000 mg | ORAL_TABLET | Freq: Every day | ORAL | 6 refills | Status: DC | PRN

## 2019-12-29 NOTE — Telephone Encounter (Signed)
Received a fax from [facility] regarding the directions on patient's Sildenafil RX. Currently written for TID PRN, pharmacy states it is normally written PAH or once daily PRN. Please advise on directions and send new RX if appropriate to [facility].

## 2019-12-30 ENCOUNTER — Telehealth: Payer: Self-pay

## 2019-12-30 DIAGNOSIS — Z0100 Encounter for examination of eyes and vision without abnormal findings: Secondary | ICD-10-CM | POA: Diagnosis not present

## 2020-01-04 ENCOUNTER — Telehealth: Payer: Self-pay

## 2020-01-04 NOTE — Telephone Encounter (Signed)
PA for Sildenafil initiated and submitted via Cover My Meds. Key: [id number]

## 2020-01-09 DIAGNOSIS — Z03818 Encounter for observation for suspected exposure to other biological agents ruled out: Secondary | ICD-10-CM | POA: Diagnosis not present

## 2020-01-09 DIAGNOSIS — Z1152 Encounter for screening for COVID-19: Secondary | ICD-10-CM | POA: Diagnosis not present

## 2020-01-09 NOTE — Telephone Encounter (Signed)
PA denied.

## 2020-01-13 ENCOUNTER — Other Ambulatory Visit: Payer: Self-pay | Admitting: Neurology

## 2020-01-16 NOTE — Telephone Encounter (Signed)
Rx(s) sent to pharmacy electronically.  

## 2020-01-20 ENCOUNTER — Other Ambulatory Visit: Payer: Self-pay | Admitting: Neurology

## 2020-01-24 ENCOUNTER — Other Ambulatory Visit: Payer: Self-pay | Admitting: Family Medicine

## 2020-01-24 NOTE — Telephone Encounter (Signed)
Requested Prescriptions  Pending Prescriptions Disp Refills   diclofenac Sodium (VOLTAREN) 1 % GEL [Pharmacy Med Name: DICLOFENAC SODIUM 1 % Gel] 400 g 1    Sig: APPLY TOPICALLY 4 GRAMS FOUR TIMES DAILY     Analgesics:  Topicals Passed - 01/24/2020  1:11 AM      Passed - Valid encounter within last 12 months    Recent Outpatient Visits          4 weeks ago Essential hypertension   [place], Megan P, DO   7 months ago Routine general medical examination at a health care facility   [facility], [place], DO   8 months ago Essential hypertension   [place], [place], DO   8 months ago Essential hypertension   [place], [place], DO   10 months ago Essential hypertension   [place], [place], DO      Future Appointments            In 2 weeks [place], Kathlene November, MD [facility]   In 4 months  [employer], [place]   In 5 months [place], Barb Merino, DO [employer], [facility]

## 2020-02-03 ENCOUNTER — Ambulatory Visit: Payer: Medicare HMO | Admitting: Cardiovascular Disease

## 2020-02-06 NOTE — Progress Notes (Signed)
Patient ID: Thomas [name], male   DOB: 07-Apr-1951, 69 y.o.   MRN: [id number] Cardiology Office Note  Date:  02/07/2020   ID:  Thomas [name], DOB 08-01-1950, MRN [id number]  PCP:  [name], DO   Chief Complaint  Patient presents with  . office visit    12 month F/U-Patient reports elevated blood pressure readings in the afternoons; Meds verbally reviewed with patient.    HPI:  69 yo male with a history of  DVT, PE on chronic anticoagulation Parkinson's,  paroxysmal atrial fibrillation,  on xarelto,  07/2015 DJD on CT scan thoracic spine No prior smoking history, no diabetes, cholesterol well controlled on a statin who presents for follow-up of his atrial fibrillation    BP typically high 90s, on their measurements Sometimes markedly elevated 160 and higher for unclear reasons Possibly in the setting of shoulder pain or right flank pain Also having some trouble with back pain  For high pressure has been taking very low-dose amlodipine In general pressures have been labile Takes lisinopril 30 mg in the evening  Doing more exercise Goes to rock steady boxing, 3x a week Gym 3x  Slow losing of weight, 10 pounds in past few months  In general feels well with no other complaints  EKG personally reviewed by myself on todays visit Sinus brady 22  Lab work reviewed with him Lab Results  Component Value Date   CHOL 120 12/27/2019   HDL 46 12/27/2019   [place] 60 12/27/2019   TRIG 65 12/27/2019    Other past medical history reviewed 06/23/2014 Lower extremity ultrasound revealed persistent partially occlusive thrombus in the left posterior tibial, popliteal, and mid femoral veins.     01/02/2015 Left lower extremity duplex  revealed persistent but improving nonocclusive partial thrombus in the femoral and popliteal veins.  Calf veins were patient.    Chest CT on 06/23/2014 revealed no pulmonary nodules or adenopathy.    He did not do well on amiodarone, felt this  made his tremor worse He is followed by neurology in [place], Dr. Carles Collet  Previously seen in the emergency room in the setting of upper respiratory infection, found to be in atrial fibrillation, converted in the emergency room after he was given amiodarone   PMH:   has a past medical history of AF (atrial fibrillation) ([place]), Allergy, Arthritis, Arthritis of knee, Atrial fibrillation ([place]), DVT (deep venous thrombosis) ([place]), Hyperchloremia, Hypercholesterolemia, Hypertension, Nephrolithiasis, Parkinson's disease ([place]), Plantar wart, Pulmonary embolism ([place]), Sciatica, Seasonal allergies, and TIA (transient ischemic attack).  PSH:    Past Surgical History:  Procedure Laterality Date  . APPENDECTOMY    . CATARACT EXTRACTION Bilateral 10/2019 11/2019  . COLONOSCOPY WITH PROPOFOL N/A 07/27/2019   Procedure: COLONOSCOPY WITH PROPOFOL;  Surgeon: Lin Landsman, MD;  Location: [facility] ENDOSCOPY;  Service: Endoscopy;  Laterality: N/A;  . KNEE ARTHROSCOPY WITH MENISCAL REPAIR Right 2009  . KNEE SURGERY Right   . T&A    . TONSILLECTOMY      Current Outpatient Medications  Medication Sig Dispense Refill  . acetaminophen (TYLENOL) 325 MG tablet Take 325 mg by mouth every 6 (six) hours as needed.    Marland Kitchen amantadine (SYMMETREL) 100 MG capsule TAKE 1 CAPSULE THREE TIMES DAILY 270 capsule 1  . amLODipine (NORVASC) 2.5 MG tablet Take 0.5 tablets (1.25 mg total) by mouth daily as needed. Take on days where BP>160 45 tablet 1  . carbidopa-levodopa (SINEMET CR) 50-200 MG tablet TAKE 1 TABLET AT BEDTIME 90 tablet  1  . carbidopa-levodopa (SINEMET IR) 25-100 MG tablet TAKE 2 TABLETS AT 6AM, 10AM, AND 2PM, AND TAKE 1 TABLET AT 6PM AS DIRECTED. CAN TAKE EXTRA 1/2 TABLET-1 TABLET AS NEEDED. 630 tablet 0  . cetirizine (ZYRTEC) 10 MG tablet Take 10 mg by mouth daily.    . cholecalciferol (VITAMIN D3) 25 MCG (1000 UNIT) tablet Take 1,000 Units by mouth daily.    . diclofenac Sodium (VOLTAREN) 1 % GEL APPLY TOPICALLY 4  GRAMS FOUR TIMES DAILY 400 g 1  . lisinopril (ZESTRIL) 30 MG tablet Take 1 tablet (30 mg total) by mouth daily. 90 tablet 1  . Melatonin 2.5 MG CAPS Take 1 capsule by mouth at bedtime as needed (for sleep).     . metoprolol succinate (TOPROL-XL) 25 MG 24 hr tablet Take 1 tablet (25 mg total) by mouth daily. 90 tablet 1  . Multiple Vitamin (MULTIVITAMIN) tablet Take 1 tablet by mouth daily.    Marland Kitchen PREVIDENT 5000 BOOSTER PLUS 1.1 % PSTE See admin instructions.    Marland Kitchen rOPINIRole (REQUIP) 2 MG tablet TAKE 1 TABLET THREE TIMES DAILY 270 tablet 1  . sildenafil (REVATIO) 20 MG tablet Take 1-5 tablets (20-100 mg total) by mouth daily as needed. 90 tablet 6  . simvastatin (ZOCOR) 40 MG tablet TAKE 1 TABLET EVERY DAY 90 tablet 1  . tamsulosin (FLOMAX) 0.4 MG CAPS capsule TAKE 1 CAPSULE EVERY DAY 90 capsule 1  . traZODone (DESYREL) 50 MG tablet TAKE 1/2 TO 1 TABLET AT BEDTIME AS NEEDED FOR SLEEP 90 tablet 0  . XARELTO 20 MG TABS tablet TAKE 1 TABLET (20 MG TOTAL) BY MOUTH DAILY. 90 tablet 3   No current facility-administered medications for this visit.     Allergies:   Codeine   Social History:  The patient  reports that he has never smoked. He has never used smokeless tobacco. He reports that he does not drink alcohol and does not use drugs.   Family History:   family history includes Coronary artery disease in his mother; Deep vein thrombosis in his sister; Heart attack in his sister; Parkinson's disease in his father.    Review of Systems: Review of Systems  Respiratory: Negative.   Cardiovascular: Negative.   Gastrointestinal: Negative.   Musculoskeletal: Positive for joint pain.  Neurological: Negative.   Psychiatric/Behavioral: Negative.   All other systems reviewed and are negative.   PHYSICAL EXAM: VS:  BP (!) 146/90 (BP Location: Left Arm, Patient Position: Sitting, Cuff Size: Large)   Pulse (!) 54   Ht 5\' 10"  (1.778 m)   Wt 229 lb (103.9 kg)   SpO2 98%   BMI 32.86 kg/m  , BMI  Body mass index is 32.86 kg/m. Constitutional:  oriented to person, place, and time. No distress.  HENT:  Head: Grossly normal Eyes:  no discharge. No scleral icterus.  Neck: No JVD, no carotid bruits  Cardiovascular: Regular rate and rhythm, no murmurs appreciated Pulmonary/Chest: Clear to auscultation bilaterally, no wheezes or rails Abdominal: Soft.  no distension.  no tenderness.  Musculoskeletal: Normal range of motion Neurological:  normal muscle tone. Coordination normal. No atrophy Skin: Skin warm and dry Psychiatric: normal affect, pleasant  Recent Labs: 06/28/2019: Hemoglobin 14.9; Platelets 152; TSH 1.640 12/27/2019: ALT 6; BUN 16; Creatinine, Ser 0.97; Potassium 4.5; Sodium 141    Lipid Panel Lab Results  Component Value Date   CHOL 120 12/27/2019   HDL 46 12/27/2019   LDLCALC 60 12/27/2019   TRIG 65 12/27/2019  Wt Readings from Last 3 Encounters:  02/07/20 229 lb (103.9 kg)  12/27/19 229 lb (103.9 kg)  12/13/19 230 lb (104.3 kg)     ASSESSMENT AND PLAN:  Paroxysmal atrial fibrillation (HCC) -  On metoprolol, sinus bradycardia, denies significant symptoms No changes to the medications  PVCs Asymptomatic  Hypercholesterolemia Cholesterol is at goal on the current lipid regimen. No changes to the medications were made.  Essential hypertension Labile pressure, often high 90s then markedly elevated for unclear reasons, possibly from pain Will start hydralazine 25 mg 3 times daily as needed  for >160 Recommend less amlodipine which she was taking as needed but bothered by low pressures the next day Could consider lisinopril 15 twice daily rather than 30 in the evening  Back pain During exercise  Deep vein thrombosis (DVT) of left lower extremity, unspecified chronicity, unspecified vein (HCC)  nonocclusive clot left popliteal vein  on chronic anticoagulation xarelto.  Active  Other chronic pulmonary embolism (HCC) On chronic anticoagulation    Parkinson's disease ([place]) doing exercise , appears stable   Total encounter time more than 25 minutes  Greater than 50% was spent in counseling and coordination of care with the patient  Orders Placed This Encounter  Procedures  . EKG 12-Lead    Signed, Esmond Plants, M.D., Ph.D. 02/07/2020  [place], [place]

## 2020-02-07 ENCOUNTER — Ambulatory Visit: Payer: Medicare HMO | Admitting: Cardiovascular Disease

## 2020-02-07 ENCOUNTER — Encounter: Payer: Self-pay | Admitting: Cardiovascular Disease

## 2020-02-07 ENCOUNTER — Other Ambulatory Visit: Payer: Self-pay

## 2020-02-07 ENCOUNTER — Telehealth: Payer: Self-pay | Admitting: General Practice

## 2020-02-07 VITALS — BP 146/90 | HR 54 | Ht 70.0 in | Wt 229.0 lb

## 2020-02-07 DIAGNOSIS — E7800 Pure hypercholesterolemia, unspecified: Secondary | ICD-10-CM

## 2020-02-07 DIAGNOSIS — I10 Essential (primary) hypertension: Secondary | ICD-10-CM | POA: Diagnosis not present

## 2020-02-07 DIAGNOSIS — I2782 Chronic pulmonary embolism: Secondary | ICD-10-CM | POA: Diagnosis not present

## 2020-02-07 DIAGNOSIS — I480 Paroxysmal atrial fibrillation: Secondary | ICD-10-CM

## 2020-02-07 DIAGNOSIS — G20 Parkinson's disease: Secondary | ICD-10-CM

## 2020-02-07 MED ORDER — HYDRALAZINE HCL 25 MG PO TABS: 25.0000 mg | ORAL_TABLET | Freq: Three times a day (TID) | ORAL | 3 refills | Status: DC | PRN

## 2020-02-07 NOTE — Patient Instructions (Addendum)
Hydrate when pressures low  Medication Instructions:   Lisinopril 15 mg twice a day (ok to cut the 30 mg pill in 1/2 twice a day)  Hydralazine 25 mg up to three times a day PRN for pressure >160  Amlodipine only if needed, PRN, Not in addition to hydralazine   If you need a refill on your cardiac medications before your next appointment, please call your pharmacy.    Lab work: No new labs needed   If you have labs (blood work) drawn today and your tests are completely normal, you will receive your results only by: Marland Kitchen MyChart Message (if you have MyChart) OR . A paper copy in the mail If you have any lab test that is abnormal or we need to change your treatment, we will call you to review the results.   Testing/Procedures: No new testing needed   Follow-Up: At [facility], you and your health needs are our priority.  As part of our continuing mission to provide you with exceptional heart care, we have created designated Provider Care Teams.  These Care Teams include your primary Cardiologist (physician) and Advanced Practice Providers (APPs -  Physician Assistants and Nurse Practitioners) who all work together to provide you with the care you need, when you need it.  . You will need a follow up appointment in 12 months  . Providers on your designated Care Team:   . Murray Hodgkins, NP . Christell Faith, PA-C . Marrianne Mood, PA-C  Any Other Special Instructions Will Be Listed Below (If Applicable).  COVID-19 Vaccine Information can be found at: ShippingScam.co.uk For questions related to vaccine distribution or appointments, please email [email or web address] or call [phone].

## 2020-02-07 NOTE — Telephone Encounter (Signed)
  Chronic Care Management   Note  02/07/2020 Name: Thomas [name] MRN: [id number] DOB: 1951/01/05  The patients wife called and stated the patient needed to see his eye doctor on 02-14-2020 and needed to change the appointment with the [facility].  The patient since his cataract surgery has not been able to read and this is the only time they could see the eye doctor. Rescheduled the appointment with the patients wife. Will continue to monitor.   Follow up plan: Telephone follow up appointment with care management team member scheduled for: 03-02-2020 at [place] am  Noreene Larsson RN, MSN, [facility] Mobile: [phone]

## 2020-02-14 ENCOUNTER — Telehealth: Payer: Self-pay

## 2020-02-20 ENCOUNTER — Ambulatory Visit (INDEPENDENT_AMBULATORY_CARE_PROVIDER_SITE_OTHER): Payer: Medicare HMO | Admitting: Family Medicine

## 2020-02-20 ENCOUNTER — Ambulatory Visit
Admission: RE | Admit: 2020-02-20 | Discharge: 2020-02-20 | Disposition: A | Discharge status: Home / Self Care | Payer: Medicare HMO | Source: Home / Self Care | Attending: Family Medicine | Admitting: Family Medicine

## 2020-02-20 ENCOUNTER — Encounter: Payer: Self-pay | Admitting: Family Medicine

## 2020-02-20 ENCOUNTER — Ambulatory Visit
Admission: RE | Admit: 2020-02-20 | Discharge: 2020-02-20 | Disposition: A | Discharge status: Home / Self Care | Payer: Medicare HMO | Source: Ambulatory Visit | Attending: Family Medicine | Admitting: Family Medicine

## 2020-02-20 ENCOUNTER — Other Ambulatory Visit: Payer: Self-pay

## 2020-02-20 VITALS — BP 152/91 | HR 53 | Temp 97.4°F | Wt 229.0 lb

## 2020-02-20 DIAGNOSIS — M545 Low back pain, unspecified: Secondary | ICD-10-CM | POA: Diagnosis not present

## 2020-02-20 DIAGNOSIS — I10 Essential (primary) hypertension: Secondary | ICD-10-CM

## 2020-02-20 LAB — UA/M W/RFLX CULTURE, ROUTINE
Bilirubin, UA: NEGATIVE
Glucose, UA: NEGATIVE
Leukocytes,UA: NEGATIVE
Nitrite, UA: NEGATIVE
Protein,UA: NEGATIVE
RBC, UA: NEGATIVE
Specific Gravity, UA: 1.025 (ref 1.005–1.030)
Urobilinogen, Ur: 1 mg/dL (ref 0.2–1.0)
pH, UA: 6 (ref 5.0–7.5)

## 2020-02-20 NOTE — Progress Notes (Signed)
BP (!) 152/91 (BP Location: Left Arm)   Pulse (!) 53   Temp (!) 97.4 F (36.3 C)   Wt 229 lb (103.9 kg)   SpO2 98%   BMI 32.86 kg/m    Subjective:    Patient ID: Thomas [name], male    DOB: Mar 19, 1951, 69 y.o.   MRN: [id number]  HPI: Thomas [name] is a 69 y.o. male  Chief Complaint  Patient presents with  . Back Pain    pt states lower back pain on both sides has been ongoing for a few months, is worse when he stands in one place for a long time. Pt states when he sits down it releives the pain   . Hypertension    pt states his BP has been elevated since October    BACK PAIN Duration: couple of years- worse in the past couple of months Mechanism of injury: no trauma Location: bilateral and low back Onset: gradual Severity: moderate Quality: sharp Frequency: with standing for a long time or walking Radiation: none Aggravating factors: movement, walking, laying and bending Alleviating factors: sitting, bending over, ibuprofen, tylenol Status: worse Treatments attempted: rest, ice, heat, APAP and ibuprofen  Relief with NSAIDs?: significant Nighttime pain:  no Paresthesias / decreased sensation:  no Bowel / bladder incontinence:  no Fevers:  no Dysuria / urinary frequency:  no  HYPERTENSION Hypertension status: exacerbated  Satisfied with current treatment? no Duration of hypertension: chronic BP monitoring frequency:  a few times a day BP range: 150s-160s, BP remains labile- has been down to 84 systlolic BP medication side effects:  no Medication compliance: excellent compliance Previous BP meds: amlodipine, metoprolol, hydralazine  Aspirin: no Recurrent headaches: no Visual changes: no Palpitations: no Dyspnea: no Chest pain: no Lower extremity edema: no Dizzy/lightheaded: no   Relevant past medical, surgical, family and social history reviewed and updated as indicated. Interim medical history since our last visit reviewed. Allergies and medications  reviewed and updated.  Review of Systems  Constitutional: Negative.   Respiratory: Negative.   Cardiovascular: Negative.   Gastrointestinal: Negative.   Musculoskeletal: Positive for back pain and myalgias. Negative for arthralgias, gait problem, joint swelling, neck pain and neck stiffness.  Skin: Negative.   Neurological: Positive for tremors and weakness. Negative for dizziness, seizures, syncope, facial asymmetry, speech difficulty, light-headedness, numbness and headaches.  Psychiatric/Behavioral: Negative.     Per HPI unless specifically indicated above     Objective:    BP (!) 152/91 (BP Location: Left Arm)   Pulse (!) 53   Temp (!) 97.4 F (36.3 C)   Wt 229 lb (103.9 kg)   SpO2 98%   BMI 32.86 kg/m   Wt Readings from Last 3 Encounters:  02/20/20 229 lb (103.9 kg)  02/07/20 229 lb (103.9 kg)  12/27/19 229 lb (103.9 kg)    Physical Exam Vitals and nursing note reviewed.  Constitutional:      General: He is not in acute distress.    Appearance: Normal appearance. He is not ill-appearing, toxic-appearing or diaphoretic.  HENT:     Head: Normocephalic and atraumatic.     Right Ear: External ear normal.     Left Ear: External ear normal.     Nose: Nose normal.     Mouth/Throat:     Mouth: Mucous membranes are moist.     Pharynx: Oropharynx is clear.  Eyes:     General: No scleral icterus.       Right eye: No discharge.  Left eye: No discharge.     Extraocular Movements: Extraocular movements intact.     Conjunctiva/sclera: Conjunctivae normal.     Pupils: Pupils are equal, round, and reactive to light.  Cardiovascular:     Rate and Rhythm: Normal rate and regular rhythm.     Pulses: Normal pulses.     Heart sounds: Normal heart sounds. No murmur heard.  No friction rub. No gallop.   Pulmonary:     Effort: Pulmonary effort is normal. No respiratory distress.     Breath sounds: Normal breath sounds. No stridor. No wheezing, rhonchi or rales.  Chest:      Chest wall: No tenderness.  Musculoskeletal:        General: Normal range of motion.     Cervical back: Normal range of motion and neck supple.  Skin:    General: Skin is warm and dry.     Capillary Refill: Capillary refill takes less than 2 seconds.     Coloration: Skin is not jaundiced or pale.     Findings: No bruising, erythema, lesion or rash.  Neurological:     General: No focal deficit present.     Mental Status: He is alert and oriented to person, place, and time. Mental status is at baseline.  Psychiatric:        Mood and Affect: Mood normal.        Behavior: Behavior normal.        Thought Content: Thought content normal.        Judgment: Judgment normal.     Results for orders placed or performed in visit on 12/27/19  Comprehensive metabolic panel  Result Value Ref Range   Glucose 92 65 - 99 mg/dL   BUN 16 8 - 27 mg/dL   Creatinine, Ser 0.97 0.76 - 1.27 mg/dL   GFR calc non Af Amer 79 >59 mL/min/1.73   GFR calc Af Amer 91 >59 mL/min/1.73   BUN/Creatinine Ratio 16 10 - 24   Sodium 141 134 - 144 mmol/L   Potassium 4.5 3.5 - 5.2 mmol/L   Chloride 106 96 - 106 mmol/L   CO2 22 20 - 29 mmol/L   Calcium 9.3 8.6 - 10.2 mg/dL   Total Protein 6.8 6.0 - 8.5 g/dL   Albumin 4.3 3.8 - 4.8 g/dL   Globulin, Total 2.5 1.5 - 4.5 g/dL   Albumin/Globulin Ratio 1.7 1.2 - 2.2   Bilirubin Total 0.7 0.0 - 1.2 mg/dL   Alkaline Phosphatase 107 44 - 121 IU/L   AST 11 0 - 40 IU/L   ALT 6 0 - 44 IU/L  Lipid Panel w/o Chol/HDL Ratio  Result Value Ref Range   Cholesterol, Total 120 100 - 199 mg/dL   Triglycerides 65 0 - 149 mg/dL   HDL 46 >39 mg/dL   VLDL Cholesterol Cal 14 5 - 40 mg/dL   LDL Chol Calc (NIH) 60 0 - 99 mg/dL      Assessment & Plan:   Problem List Items Addressed This Visit      Cardiovascular and Mediastinum   Hypertension    Continues to be very labile. Will start amlodipine every other day and continue to monitor BP. Recheck 2 weeks.        Other Visit  Diagnoses    Acute low back pain, unspecified back pain laterality, unspecified whether sciatica present    -  Primary   Will check x-ray and treat with voltaren, if not getting better will get him  into PT.    Relevant Orders   UA/M w/rflx Culture, Routine   DG Lumbar Spine Complete       Follow up plan: Return in about 2 weeks (around 03/05/2020) for virtual OK.

## 2020-02-20 NOTE — Assessment & Plan Note (Signed)
Continues to be very labile. Will start amlodipine every other day and continue to monitor BP. Recheck 2 weeks.

## 2020-02-21 ENCOUNTER — Other Ambulatory Visit: Payer: Self-pay | Admitting: Family Medicine

## 2020-02-21 MED ORDER — POLYETHYLENE GLYCOL 3350 17 GM/SCOOP PO POWD: 17.0000 g | Freq: Two times a day (BID) | ORAL | 1 refills | Status: DC | PRN

## 2020-02-23 ENCOUNTER — Other Ambulatory Visit: Payer: Self-pay | Admitting: Family Medicine

## 2020-03-02 ENCOUNTER — Telehealth: Payer: Self-pay

## 2020-03-05 ENCOUNTER — Encounter: Payer: Self-pay | Admitting: Family Medicine

## 2020-03-05 ENCOUNTER — Telehealth (INDEPENDENT_AMBULATORY_CARE_PROVIDER_SITE_OTHER): Payer: Medicare HMO | Admitting: Family Medicine

## 2020-03-05 DIAGNOSIS — M545 Low back pain, unspecified: Secondary | ICD-10-CM

## 2020-03-05 NOTE — Progress Notes (Signed)
There were no vitals taken for this visit.   Subjective:    Patient ID: Thomas [name], male    DOB: 1950-11-27, 69 y.o.   MRN: [id number]  HPI: Thomas [name] is a 69 y.o. male  Chief Complaint  Patient presents with  . Back Pain    2 week follow up, states pain is worse at night   BACK PAIN Duration: couple of years- worse in the past couple of months Mechanism of injury: no trauma Location: bilateral and low back Onset: gradual Severity: 6-7/10 Quality: aching and sore Frequency: when standing for long periods of time Radiation: none Aggravating factors: standing for long periods Alleviating factors: sitting, bending over,laying, stretches Status: better Treatments attempted: rest, ice, heat, APAP, ibuprofen, aleve and HEP  Relief with NSAIDs?: moderate Nighttime pain:  no Paresthesias / decreased sensation:  no Bowel / bladder incontinence:  no Fevers:  no Dysuria / urinary frequency:  no  Has been taking the hydralazine every other day and the hydralazine as needed in the PM. BP has been running better in the 130s-150s, has been running better in the 100s-110s.   Relevant past medical, surgical, family and social history reviewed and updated as indicated. Interim medical history since our last visit reviewed. Allergies and medications reviewed and updated.  Review of Systems  Constitutional: Negative.   Respiratory: Negative.   Cardiovascular: Negative.   Gastrointestinal: Negative.   Musculoskeletal: Positive for back pain and myalgias. Negative for arthralgias, gait problem, joint swelling, neck pain and neck stiffness.  Skin: Negative.   Neurological: Negative.   Psychiatric/Behavioral: Negative.     Per HPI unless specifically indicated above     Objective:    There were no vitals taken for this visit.  Wt Readings from Last 3 Encounters:  02/20/20 229 lb (103.9 kg)  02/07/20 229 lb (103.9 kg)  12/27/19 229 lb (103.9 kg)    Physical Exam Vitals  and nursing note reviewed.  Pulmonary:     Effort: Pulmonary effort is normal. No respiratory distress.     Comments: Speaking in full sentences Neurological:     Mental Status: He is alert.  Psychiatric:        Mood and Affect: Mood normal.        Behavior: Behavior normal.        Thought Content: Thought content normal.        Judgment: Judgment normal.     Results for orders placed or performed in visit on 02/20/20  UA/M w/rflx Culture, Routine   Specimen: Urine   Urine  Result Value Ref Range   Specific Gravity, UA 1.025 1.005 - 1.030   pH, UA 6.0 5.0 - 7.5   Color, UA Yellow Yellow   Appearance Ur Clear Clear   Leukocytes,UA Negative Negative   Protein,UA Negative Negative/Trace   Glucose, UA Negative Negative   Ketones, UA 1+ (A) Negative   RBC, UA Negative Negative   Bilirubin, UA Negative Negative   Urobilinogen, Ur 1.0 0.2 - 1.0 mg/dL   Nitrite, UA Negative Negative      Assessment & Plan:   Problem List Items Addressed This Visit    None    Visit Diagnoses    Acute low back pain, unspecified back pain laterality, unspecified whether sciatica present    -  Primary   Constipation resolved. Discussed PT, would like to hold off for now. If not better in the next couple of weeks, will get referral placed.  Follow up plan: Return March follow up.   . This visit was completed via telephone due to the restrictions of the COVID-19 pandemic. All issues as above were discussed and addressed but no physical exam was performed. If it was felt that the patient should be evaluated in the office, they were directed there. The patient verbally consented to this visit. Patient was unable to complete an audio/visual visit due to Lack of equipment. Due to the catastrophic nature of the COVID-19 pandemic, this visit was done through audio contact only. . Location of the patient: home . Location of the provider: work . Those involved with this call:  . Provider: Park Liter, DO . CMA: Louanna Raw, [place] . Front Desk/Registration: Jill Side  . Time spent on call: 21 minutes on the phone discussing health concerns. 23 minutes total spent in review of patient's record and preparation of their chart.

## 2020-03-19 DIAGNOSIS — Z03818 Encounter for observation for suspected exposure to other biological agents ruled out: Secondary | ICD-10-CM | POA: Diagnosis not present

## 2020-03-19 DIAGNOSIS — Z1152 Encounter for screening for COVID-19: Secondary | ICD-10-CM | POA: Diagnosis not present

## 2020-03-22 ENCOUNTER — Other Ambulatory Visit: Payer: Self-pay | Admitting: Neurology

## 2020-03-29 ENCOUNTER — Telehealth: Payer: Self-pay

## 2020-03-29 NOTE — Chronic Care Management (AMB) (Signed)
  Care Management   Note  03/29/2020 Name: Thomas [name] MRN: [id number] DOB: January 11, 1951  Thomas [name] is a 69 y.o. year old male who is a primary care patient of [name], DO and is actively engaged with the care management team. I reached out to [name] by phone today to assist with re-scheduling a follow up visit with the RN Case Manager  Follow up plan: Patient declines further follow up and engagement by the care management team. Appropriate care team members and provider have been notified via electronic communication.   Noreene Larsson, [place], [place], [place] [address] Direct Dial: [phone] [name]: [email or web address]

## 2020-04-09 ENCOUNTER — Telehealth: Payer: Self-pay

## 2020-04-09 DIAGNOSIS — M545 Low back pain, unspecified: Secondary | ICD-10-CM

## 2020-04-09 NOTE — Telephone Encounter (Signed)
Copied from CRM [id number]. Topic: General - Other >> Apr 09, 2020  8:48 AM Gwenlyn Fudge wrote: Reason for CRM: Pts wife called stating that the pt has lower back pain. She states that the pt is requesting to start PT as it is affecting his standing and walking. Please  advise.   Would pt need apt first or can this referral be sent?

## 2020-04-09 NOTE — Addendum Note (Signed)
Addended by: Dorcas Carrow on: 04/09/2020 09:17 AM   Modules accepted: Orders

## 2020-04-09 NOTE — Telephone Encounter (Signed)
Can a referral be placed?

## 2020-04-11 ENCOUNTER — Telehealth: Payer: Self-pay

## 2020-04-11 NOTE — Telephone Encounter (Signed)
Patient's wife states that he is now wanting to start PT for the back pain he discussed with you about a month ago.

## 2020-04-11 NOTE — Telephone Encounter (Signed)
Order is in. Just needs to call and schedule

## 2020-04-11 NOTE — Telephone Encounter (Signed)
Patients wife notified

## 2020-04-11 NOTE — Telephone Encounter (Signed)
Copied from CRM [id number]. Topic: General - Inquiry >> Apr 11, 2020 12:37 PM Adrian Prince D wrote: Reason for CRM: Patients wife called again requesting a return call about husband's back pain and starting PT for him. She would like a call back today. She can be reached at [phone]. Please advise

## 2020-04-12 NOTE — Telephone Encounter (Signed)
Wife called to schedule and they can't get him until 05-09-2020 and the wife says that is too long. See if they can get a referral to someone else. She would like for you to check [facility] physical therapy . She can be reached at [phone]. Please advise

## 2020-04-12 NOTE — Telephone Encounter (Signed)
Spoke with referral coordinator, she will reach out to family to try to get patient seen sooner with PT

## 2020-04-12 NOTE — Telephone Encounter (Signed)
Sent referral to [facility] Physical therapy. Called wife and let her know.

## 2020-04-19 DIAGNOSIS — M5136 Other intervertebral disc degeneration, lumbar region: Secondary | ICD-10-CM | POA: Diagnosis not present

## 2020-04-24 ENCOUNTER — Telehealth: Payer: Self-pay

## 2020-04-24 DIAGNOSIS — M5136 Other intervertebral disc degeneration, lumbar region: Secondary | ICD-10-CM | POA: Diagnosis not present

## 2020-04-26 ENCOUNTER — Other Ambulatory Visit: Payer: Self-pay | Admitting: Family Medicine

## 2020-04-26 DIAGNOSIS — M5136 Other intervertebral disc degeneration, lumbar region: Secondary | ICD-10-CM | POA: Diagnosis not present

## 2020-04-28 ENCOUNTER — Other Ambulatory Visit: Payer: Self-pay | Admitting: Neurology

## 2020-05-01 DIAGNOSIS — M5136 Other intervertebral disc degeneration, lumbar region: Secondary | ICD-10-CM | POA: Diagnosis not present

## 2020-05-07 DIAGNOSIS — M5136 Other intervertebral disc degeneration, lumbar region: Secondary | ICD-10-CM | POA: Diagnosis not present

## 2020-05-10 DIAGNOSIS — M5136 Other intervertebral disc degeneration, lumbar region: Secondary | ICD-10-CM | POA: Diagnosis not present

## 2020-05-17 DIAGNOSIS — M5136 Other intervertebral disc degeneration, lumbar region: Secondary | ICD-10-CM | POA: Diagnosis not present

## 2020-05-24 ENCOUNTER — Other Ambulatory Visit: Payer: Self-pay

## 2020-05-24 ENCOUNTER — Encounter: Payer: Self-pay | Admitting: Family Medicine

## 2020-05-24 ENCOUNTER — Ambulatory Visit (INDEPENDENT_AMBULATORY_CARE_PROVIDER_SITE_OTHER): Payer: Medicare HMO | Admitting: Family Medicine

## 2020-05-24 VITALS — BP 148/89 | HR 56 | Temp 97.5°F | Wt 230.6 lb

## 2020-05-24 DIAGNOSIS — M545 Low back pain, unspecified: Secondary | ICD-10-CM | POA: Diagnosis not present

## 2020-05-24 DIAGNOSIS — M5136 Other intervertebral disc degeneration, lumbar region: Secondary | ICD-10-CM | POA: Diagnosis not present

## 2020-05-24 DIAGNOSIS — G8929 Other chronic pain: Secondary | ICD-10-CM | POA: Insufficient documentation

## 2020-05-24 DIAGNOSIS — I10 Essential (primary) hypertension: Secondary | ICD-10-CM

## 2020-05-24 MED ORDER — PREDNISONE 10 MG PO TABS: ORAL_TABLET | ORAL | 0 refills | Status: DC

## 2020-05-24 MED ORDER — ALPRAZOLAM 0.5 MG PO TABS: ORAL_TABLET | ORAL | 0 refills | Status: DC

## 2020-05-24 NOTE — Assessment & Plan Note (Signed)
No better with PT. DJD on xray- will treat with prednisone for now and arrange for MRI. Referral to physiatry. Call with any concerns. Continue to monitor.

## 2020-05-24 NOTE — Progress Notes (Signed)
BP (!) 148/89   Pulse (!) 56   Temp (!) 97.5 F (36.4 C)   Wt 230 lb 9.6 oz (104.6 kg)   SpO2 98%   BMI 33.09 kg/m    Subjective:    Patient ID: Thomas [name], male    DOB: [birth year], 70 y.o.   MRN: [id number]  HPI: Thomas [name] is a 70 y.o. male  Chief Complaint  Patient presents with  . Back Pain    Patient states back pain has gotten worse over the past week. Patient is currently in PT. Pain is worse when he walks and stands.    BACK PAIN Duration: couple of years, significantly worse in the past couple of months, WAY worse the past month Mechanism of injury: no trauma Location: bilateral and low back Onset: gradual Severity: severe Quality: aching and sore Frequency: when standing for long periods of time Radiation: none Aggravating factors: standing for long periods Alleviating factors: sitting, bending over, laying, stretches Status: better Treatments attempted: rest, ice, heat, APAP, ibuprofen, aleve and physical therapy  Relief with NSAIDs?: significant Nighttime pain:  yes Paresthesias / decreased sensation:  no Bowel / bladder incontinence:  no Fevers:  no Dysuria / urinary frequency:  no  Relevant past medical, surgical, family and social history reviewed and updated as indicated. Interim medical history since our last visit reviewed. Allergies and medications reviewed and updated.  Review of Systems  Constitutional: Negative.   Respiratory: Negative.   Cardiovascular: Negative.   Gastrointestinal: Negative.   Musculoskeletal: Positive for back pain and myalgias. Negative for arthralgias, gait problem, joint swelling, neck pain and neck stiffness.  Skin: Negative.   Neurological: Positive for weakness. Negative for dizziness, tremors, seizures, syncope, facial asymmetry, speech difficulty, light-headedness, numbness and headaches.    Per HPI unless specifically indicated above     Objective:    BP (!) 148/89   Pulse (!) 56   Temp (!) 97.5 F  (36.4 C)   Wt 230 lb 9.6 oz (104.6 kg)   SpO2 98%   BMI 33.09 kg/m   Wt Readings from Last 3 Encounters:  05/24/20 230 lb 9.6 oz (104.6 kg)  02/20/20 229 lb (103.9 kg)  02/07/20 229 lb (103.9 kg)    Physical Exam Vitals and nursing note reviewed.  Constitutional:      General: He is not in acute distress.    Appearance: Normal appearance. He is not ill-appearing, toxic-appearing or diaphoretic.  HENT:     Head: Normocephalic and atraumatic.     Right Ear: External ear normal.     Left Ear: External ear normal.     Nose: Nose normal.     Mouth/Throat:     Mouth: Mucous membranes are moist.     Pharynx: Oropharynx is clear.  Eyes:     General: No scleral icterus.       Right eye: No discharge.        Left eye: No discharge.     Extraocular Movements: Extraocular movements intact.     Conjunctiva/sclera: Conjunctivae normal.     Pupils: Pupils are equal, round, and reactive to light.  Cardiovascular:     Rate and Rhythm: Normal rate and regular rhythm.     Pulses: Normal pulses.     Heart sounds: Normal heart sounds. No murmur heard. No friction rub. No gallop.   Pulmonary:     Effort: Pulmonary effort is normal. No respiratory distress.     Breath sounds: Normal breath sounds. No stridor.  No wheezing, rhonchi or rales.  Chest:     Chest wall: No tenderness.  Musculoskeletal:        General: Normal range of motion.     Cervical back: Normal range of motion and neck supple.  Skin:    General: Skin is warm and dry.     Capillary Refill: Capillary refill takes less than 2 seconds.     Coloration: Skin is not jaundiced or pale.     Findings: No bruising, erythema, lesion or rash.  Neurological:     General: No focal deficit present.     Mental Status: He is alert and oriented to person, place, and time. Mental status is at baseline.  Psychiatric:        Mood and Affect: Mood normal.        Behavior: Behavior normal.        Thought Content: Thought content normal.         Judgment: Judgment normal.     Results for orders placed or performed in visit on 02/20/20  UA/M w/rflx Culture, Routine   Specimen: Urine   Urine  Result Value Ref Range   Specific Gravity, UA 1.025 1.005 - 1.030   pH, UA 6.0 5.0 - 7.5   Color, UA Yellow Yellow   Appearance Ur Clear Clear   Leukocytes,UA Negative Negative   Protein,UA Negative Negative/Trace   Glucose, UA Negative Negative   Ketones, UA 1+ (A) Negative   RBC, UA Negative Negative   Bilirubin, UA Negative Negative   Urobilinogen, Ur 1.0 0.2 - 1.0 mg/dL   Nitrite, UA Negative Negative      Assessment & Plan:   Problem List Items Addressed This Visit      Other   Chronic bilateral low back pain - Primary    No better with PT. DJD on xray- will treat with prednisone for now and arrange for MRI. Referral to physiatry. Call with any concerns. Continue to monitor.       Relevant Medications   predniSONE (DELTASONE) 10 MG tablet   Other Relevant Orders   MR Lumbar Spine Wo Contrast   Ambulatory referral to Physical Medicine Rehab       Follow up plan: Return in about 4 weeks (around 06/21/2020).

## 2020-05-25 NOTE — Progress Notes (Signed)
Order(s) created erroneously. Erroneous order ID: [id number]  Order moved by: Genia Harold D  Order move date/time: 05/25/2020 3:36 PM  Source Patient: [id number]  Source Contact: 05/24/2020  Destination Patient: [id number]  Destination Contact: 02/14/2020

## 2020-05-29 DIAGNOSIS — M5136 Other intervertebral disc degeneration, lumbar region: Secondary | ICD-10-CM | POA: Diagnosis not present

## 2020-05-31 NOTE — Progress Notes (Signed)
Assessment/Plan:   1.  Parkinsons Disease             -Continue ropinirole, 2 mg 3 times per day             -Continue carbidopa/levodopa 25/100, 2 tablets at 6 AM/10 AM/2 PM/6 PM             -Continue carbidopa/levodopa 50/200 CR at bedtime  2.  Parkinson's dyskinesia             -Continue amantadine, 100 mg 3 times per day.  3.  RBD             -Discussed safety.  Declines medication right now.  4.  MCI             -Neurocognitive test in August, 2020 without evidence of dementia.  Following.  5.  Neurogenic Orthostatic Hypotension             -likely due to BP meds in combination with Parkinsons Disease.    His PCP told him to only take amlodipine if his blood pressure is running greater than 160.  He is on hydralazine, lisinopril, metoprolol and tamsulosin which all are driving down blood pressure as well.  6.  Probable lumbar radiculopathy  -pcp ordered mri lumbar spine and is pending.  I will review this once it is available.  Scheduled in 2 days.  -pmr consult pending.  They did not realize that this was not a physical therapy consult.  Explained concept of PMNR.  -they asked about pain meds.  Were told that this was Parkinsons Disease related and told to ask me.  This is not Parkinson's related, but I do think that he needs something.  I did write a prescription for tramadol.  PDMP reviewed.  No red flags.  R/B/SE were discussed.  The opportunity to ask questions was given and they were answered to the best of my ability.  The patient expressed understanding and willingness to follow the outlined treatment protocols.  7.  Sleep disturbance  -continue trazodone from primary care.  -continue melatonin 5 mg q hs  Subjective:   Thomas [name] was seen today in follow up for Parkinsons disease.  My previous records were reviewed prior to todays visit as well as outside records available to me.  Patient with his wife who supplements the history.  Pt denies falls.  Patient saw  his primary care after last visit and they did discuss his blood pressure running low.  He was told to only take his amlodipine if his blood pressure was running greater than 160.  He did that and his blood pressure started to go up.  They restarted the amlodipine every other day.  No hallucinations.  Patient has been to primary care physician a few times complaining about low back pain and MRI lumbar spine has been ordered.  It is scheduled for February 24.  She has also sent a referral to PM&R.  This LBP has been really affecting his exercise.  It is radiating some down the left leg.  He has never had sx.  Using walker more.  Some recent sleep trouble.  On trazodone.  On melatonin.  Current prescribed movement disorder medications: Ropinirole, 2 mg 3 times per day Carbidopa/levodopa 25/100, 2 tablets at 6 AM/2 tablets at 10 AM/2 tablets at 2 PM/2 tablets at 6 PM Amantadine 100 mg 3 times per day carbidopa/levodopa50/200 CR q hs    ALLERGIES:   Allergies  Allergen  Reactions  . Codeine Other (See Comments)    Reaction: Hallucinations    CURRENT MEDICATIONS:  Outpatient Encounter Medications as of 06/05/2020  Medication Sig  . acetaminophen (TYLENOL) 325 MG tablet Take 325 mg by mouth every 6 (six) hours as needed.  . ALPRAZolam (XANAX) 0.5 MG tablet Take 1 30 minutes before MRI. May repeat immediately before MRI  . amantadine (SYMMETREL) 100 MG capsule TAKE 1 CAPSULE THREE TIMES DAILY  . amLODipine (NORVASC) 2.5 MG tablet Take 0.5 tablets (1.25 mg total) by mouth daily as needed. Take on days where BP>160  . carbidopa-levodopa (SINEMET CR) 50-200 MG tablet TAKE 1 TABLET AT BEDTIME  . carbidopa-levodopa (SINEMET IR) 25-100 MG tablet TAKE 2 TABLETS AT 6AM, 10AM, AND 2PM, AND TAKE 1 TABLET AT 6PM AS DIRECTED. CAN TAKE EXTRA 1/2 TABLET-1 TABLET AS NEEDED.  Marland Kitchen cetirizine (ZYRTEC) 10 MG tablet Take 10 mg by mouth daily.  . cholecalciferol (VITAMIN D3) 25 MCG (1000 UNIT) tablet Take 1,000 Units by  mouth daily.  . diclofenac Sodium (VOLTAREN) 1 % GEL APPLY TOPICALLY 4 GRAMS FOUR TIMES DAILY  . hydrALAZINE (APRESOLINE) 25 MG tablet Take 1 tablet (25 mg total) by mouth 3 (three) times daily as needed (for SBP >160).  Marland Kitchen lisinopril (ZESTRIL) 30 MG tablet Take 1 tablet (30 mg total) by mouth daily.  . melatonin 5 MG TABS Take 1 capsule by mouth at bedtime as needed (for sleep).   . metoprolol succinate (TOPROL-XL) 25 MG 24 hr tablet Take 1 tablet (25 mg total) by mouth daily.  . Multiple Vitamin (MULTIVITAMIN) tablet Take 1 tablet by mouth daily.  . polyethylene glycol powder (GLYCOLAX/MIRALAX) 17 GM/SCOOP powder Take 17 g by mouth 2 (two) times daily as needed.  Marland Kitchen PREVIDENT 5000 BOOSTER PLUS 1.1 % PSTE See admin instructions.  Marland Kitchen rOPINIRole (REQUIP) 2 MG tablet TAKE 1 TABLET THREE TIMES DAILY  . sildenafil (REVATIO) 20 MG tablet Take 1-5 tablets (20-100 mg total) by mouth daily as needed.  . simvastatin (ZOCOR) 40 MG tablet TAKE 1 TABLET EVERY DAY  . SYSTANE ULTRA 0.4-0.3 % SOLN SMARTSIG:1 Drop(s) In Eye(s) PRN  . tamsulosin (FLOMAX) 0.4 MG CAPS capsule TAKE 1 CAPSULE EVERY DAY  . traZODone (DESYREL) 50 MG tablet TAKE 1/2 TO 1 TABLET AT BEDTIME AS NEEDED FOR SLEEP  . XARELTO 20 MG TABS tablet TAKE 1 TABLET (20 MG TOTAL) BY MOUTH DAILY.  . [DISCONTINUED] predniSONE (DELTASONE) 10 MG tablet 6 tabs today and tomorrow, 5 tabs the next 2 days, decrease by 1 every other day until gone. (Patient not taking: Reported on 06/05/2020)   No facility-administered encounter medications on file as of 06/05/2020.    Objective:   PHYSICAL EXAMINATION:    VITALS:  There were no vitals filed for this visit.  GEN:  The patient appears stated age and is in NAD. HEENT:  Normocephalic, atraumatic.  The mucous membranes are moist. The superficial temporal arteries are without ropiness or tenderness. CV:  RRR Lungs:  CTAB Neck/HEME:  There are no carotid bruits bilaterally.  Neurological  examination:  Orientation: The patient is alert and oriented x3. Cranial nerves: There is good facial symmetry with facial hypomimia. The speech is fluent and clear. Soft palate rises symmetrically and there is no tongue deviation. Hearing is intact to conversational tone. Sensation: Sensation is intact to light touch throughout Motor: Strength is at least antigravity x4.  Movement examination: Tone: There is normal tone in the upper and lower extremities. Abnormal movements: some axial  dyskinesia Coordination:  There is no decremation with RAM's Gait and Station: The patient has mild difficulty arising out of a deep-seated chair without the use of the hands. The patient's stride length is good.    I have reviewed and interpreted the following labs independently    Chemistry      Component Value Date/Time   NA 141 12/27/2019 1150   K 4.5 12/27/2019 1150   CL 106 12/27/2019 1150   CO2 22 12/27/2019 1150   BUN 16 12/27/2019 1150   CREATININE 0.97 12/27/2019 1150      Component Value Date/Time   CALCIUM 9.3 12/27/2019 1150   ALKPHOS 107 12/27/2019 1150   AST 11 12/27/2019 1150   ALT 6 12/27/2019 1150   BILITOT 0.7 12/27/2019 1150       Lab Results  Component Value Date   WBC 6.0 06/28/2019   HGB 14.9 06/28/2019   HCT 44.7 06/28/2019   MCV 92 06/28/2019   PLT 152 06/28/2019    Lab Results  Component Value Date   TSH 1.640 06/28/2019     Total time spent on today's visit was 31 minutes, including both face-to-face time and nonface-to-face time.  Time included that spent on review of records (prior notes available to me/labs/imaging if pertinent), discussing treatment and goals, answering patient's questions and coordinating care.  Cc:  [name], DO

## 2020-06-05 ENCOUNTER — Other Ambulatory Visit: Payer: Self-pay

## 2020-06-05 ENCOUNTER — Telehealth: Payer: Self-pay | Admitting: Family Medicine

## 2020-06-05 ENCOUNTER — Encounter: Payer: Self-pay | Admitting: Neurology

## 2020-06-05 ENCOUNTER — Ambulatory Visit: Payer: Medicare HMO | Admitting: Neurology

## 2020-06-05 VITALS — BP 108/74 | HR 63 | Ht 70.0 in | Wt 229.0 lb

## 2020-06-05 DIAGNOSIS — M5416 Radiculopathy, lumbar region: Secondary | ICD-10-CM | POA: Diagnosis not present

## 2020-06-05 DIAGNOSIS — G249 Dystonia, unspecified: Secondary | ICD-10-CM

## 2020-06-05 DIAGNOSIS — G20 Parkinson's disease: Secondary | ICD-10-CM

## 2020-06-05 MED ORDER — TRAMADOL HCL 50 MG PO TABS
50.0000 mg | ORAL_TABLET | Freq: Three times a day (TID) | ORAL | 0 refills | Status: DC | PRN
Start: 2020-06-05 — End: 2020-06-14

## 2020-06-05 NOTE — Patient Instructions (Signed)
1.  Start tramadol - 50 mg - 1 tablet every 8 hours as needed 2.  Call your PMR physician back  The physicians and staff at [facility] are committed to providing excellent care. You may receive a survey requesting feedback about your experience at our office. We strive to receive "very good" responses to the survey questions. If you feel that your experience would prevent you from giving the office a "very good " response, please contact our office to try to remedy the situation. We may be reached at [phone]. Thank you for taking the time out of your busy day to complete the survey.

## 2020-06-05 NOTE — Telephone Encounter (Signed)
Patients wife notified

## 2020-06-05 NOTE — Telephone Encounter (Signed)
If he's gotten that bad, I'd advise him to talk to his neurologist- his parkinson's is likely contributing.

## 2020-06-05 NOTE — Telephone Encounter (Signed)
Patient last seen 05/24/2020 for chronic back pain and wife Marcellius, Montagna states patient can not stand for more then 10 minutes due to back pain, patient has parkinson and has to move around. Caller would like PCP to prescribe a muscle relaxer, or pain medication today. Patient has a neurologist appointment today and will be unavailable to speak. Please call spouse on mobile # and leave a detail message regarding the status [phone]   [id number] - [place], [place] Phone:  [phone]  Fax:  [phone]

## 2020-06-05 NOTE — Telephone Encounter (Signed)
Routing to provider to advise.  

## 2020-06-05 NOTE — Telephone Encounter (Signed)
Copied from [place] [id number]. Topic: Medicare AWV >> Jun 05, 2020  4:18 PM Cher Nakai R wrote: Reason for CRM:   LM to notify AWSV will be completed by phone not in office -srs

## 2020-06-06 ENCOUNTER — Other Ambulatory Visit: Payer: Medicare HMO

## 2020-06-06 ENCOUNTER — Telehealth: Payer: Self-pay

## 2020-06-06 DIAGNOSIS — M545 Low back pain, unspecified: Secondary | ICD-10-CM

## 2020-06-06 NOTE — Telephone Encounter (Signed)
Copied from [place] [id number]. Topic: Referral - Status >> Jun 06, 2020  1:35 PM Pawlus, Brayton Layman A wrote: Reason for CRM: Pts wife called in stating Dr Jamison Oka at [facility] still did not receive the necessary referral. Caller stated this needs to be faxed over ASAP - [phone]

## 2020-06-06 NOTE — Telephone Encounter (Signed)
I Don't have a referral for Physiatry. The only referral that I see is for physical therapy and that was sent to [facility]. That visit was complete. Can we put in a new referral and I can get that over to [facility] [facility]. Thank you.

## 2020-06-06 NOTE — Telephone Encounter (Signed)
Can this be done?

## 2020-06-07 ENCOUNTER — Ambulatory Visit
Admission: RE | Admit: 2020-06-07 | Discharge: 2020-06-07 | Disposition: A | Discharge status: Home / Self Care | Payer: Medicare HMO | Source: Ambulatory Visit | Attending: Family Medicine | Admitting: Family Medicine

## 2020-06-07 ENCOUNTER — Other Ambulatory Visit: Payer: Self-pay

## 2020-06-07 DIAGNOSIS — M545 Low back pain, unspecified: Secondary | ICD-10-CM | POA: Diagnosis not present

## 2020-06-07 DIAGNOSIS — G8929 Other chronic pain: Secondary | ICD-10-CM | POA: Diagnosis not present

## 2020-06-07 NOTE — Telephone Encounter (Signed)
Referral has been sent today @ 2:30 pm via fax

## 2020-06-07 NOTE — Telephone Encounter (Signed)
Referral was placed at his last visit and he declined services. Therefore, referral was closed. Referral has been replaced today- I was not in the office yesterday afternoon. Often these things can take a couple of days.

## 2020-06-07 NOTE — Telephone Encounter (Signed)
Mrs Pannone called again about the status of the referral / she was advised by someone that the referral for Dr. Jamison Oka at [facility] was sent / I advised her that the referral was not sent per Laural Roes and was being worked on/ Mrs renwick is concerned that this delay will cause him to not be able to get into [facility] to see Dr. Jamison Oka asap and she stated he can not wait two weeks to be seen/ she stated she will call back again this afternoon to make sure it has been done/ please advise

## 2020-06-07 NOTE — Telephone Encounter (Signed)
Called and left a detailed message for patient letting him know that the referral has been placed.

## 2020-06-08 ENCOUNTER — Telehealth: Payer: Self-pay | Admitting: Neurology

## 2020-06-08 NOTE — Telephone Encounter (Signed)
Please put patient in at 10:15 on Monday to go over MRI lumbar spine that his PCP ordered.  We can do this via VV if it is easier for him

## 2020-06-08 NOTE — Telephone Encounter (Signed)
I called and spoke with patient wife and she will call back when they get home to let me know if they can do the appt.

## 2020-06-08 NOTE — Progress Notes (Signed)
Virtual Visit Via Video   The purpose of this virtual visit is to provide medical care while limiting exposure to the novel coronavirus.    Consent was obtained for video visit:  Yes.   Answered questions that patient had about telehealth interaction:  Yes.   I discussed the limitations, risks, security and privacy concerns of performing an evaluation and management service by telemedicine. I also discussed with the patient that there may be a patient responsible charge related to this service. The patient expressed understanding and agreed to proceed.  Pt location: Home Physician Location: office Name of referring provider:  [name], DO I connected with Thomas [name] at patients initiation/request on 06/11/2020 at 10:15 AM EST by video enabled telemedicine application and verified that I am speaking with the correct person using two identifiers. Pt MRN:  [id number] Pt DOB:  May 20, 1950 Video Participants:  Thomas [name];    Assessment/Plan:   1.  Parkinsons Disease -Continue ropinirole, 2 mg 3 times per day -Continue carbidopa/levodopa 25/100, 2 tablets at 6 AM/10 AM/2 PM/6 PM -Continue carbidopa/levodopa 50/200 CR at bedtime  2. Parkinson's dyskinesia -Continue amantadine, 100 mg 3 times per day.  3. RBD -Discussed safety. Declines medication right now.  4. MCI -Neurocognitive test in August, 2020 without evidence of dementia. Following.  5. Neurogenic Orthostatic Hypotension -likely due to BP meds in combination with Parkinsons Disease.   His PCP told him to only take amlodipine if his blood pressure is running greater than 160.  He is on hydralazine, lisinopril, metoprolol and tamsulosin which all are driving down blood pressure as well.  6.    Lumbar spinal stenosis and neural foraminal stenosis             -Patient needs neurosurgery consult.  Films were shown to the  patient and wife today.  I will refer to Dr. Vertell Limber.             -Patient currently taking Ultram twice per day and finding it of some value.  He will call me if he needs a refill if he has not yet seen neurosurgery.  His biggest issue is if he is up and walking a lot.  They planned a trip for April.  Does not think he will be able to walk that far and does not want to utilize a scooter.  May need to reschedule that trip.  7.  Sleep disturbance             -continue trazodone from primary care.             -continue melatonin 5 mg q hs  Subjective   Patient seen today to review MRI.  Patient with wife who supplements history.  Patient had MRI of the lumbar spine done since last visit.  This was ordered by his primary care physician.  I personally reviewed it.  He has disc protrusion at the L3-L4 resulting in moderate central canal stenosis and moderate to severe left neural foraminal stenosis.  There is moderate to severe L5-S1 bilateral neural foraminal stenosis.  Patient reporting significant back pain especially in the morning.  Throughout the day he does fairly well.  Taking Ultram twice daily.  This seems to be helping some.  Current movement d/o meds:  Ropinirole, 2 mg 3 times per day Carbidopa/levodopa 25/100, 2 tablets at 6 AM/2 tablets at 10 AM/2 tablets at 2 PM/2 tablets at 6 PM Amantadine 100 mg 3 times per day carbidopa/levodopa50/200 CR q hs  Current Outpatient Medications on File Prior to Visit  Medication Sig Dispense Refill  . acetaminophen (TYLENOL) 325 MG tablet Take 325 mg by mouth every 6 (six) hours as needed.    . ALPRAZolam (XANAX) 0.5 MG tablet Take 1 30 minutes before MRI. May repeat immediately before MRI 2 tablet 0  . amantadine (SYMMETREL) 100 MG capsule TAKE 1 CAPSULE THREE TIMES DAILY 270 capsule 1  . amLODipine (NORVASC) 2.5 MG tablet Take 0.5 tablets (1.25 mg total) by mouth daily as needed. Take on days where BP>160 45 tablet 1  . carbidopa-levodopa (SINEMET  CR) 50-200 MG tablet TAKE 1 TABLET AT BEDTIME 90 tablet 0  . carbidopa-levodopa (SINEMET IR) 25-100 MG tablet TAKE 2 TABLETS AT 6AM, 10AM, AND 2PM, AND TAKE 1 TABLET AT 6PM AS DIRECTED. CAN TAKE EXTRA 1/2 TABLET-1 TABLET AS NEEDED. 630 tablet 0  . cetirizine (ZYRTEC) 10 MG tablet Take 10 mg by mouth daily.    . cholecalciferol (VITAMIN D3) 25 MCG (1000 UNIT) tablet Take 1,000 Units by mouth daily.    . diclofenac Sodium (VOLTAREN) 1 % GEL APPLY TOPICALLY 4 GRAMS FOUR TIMES DAILY 400 g 1  . hydrALAZINE (APRESOLINE) 25 MG tablet Take 1 tablet (25 mg total) by mouth 3 (three) times daily as needed (for SBP >160). 90 tablet 3  . lisinopril (ZESTRIL) 30 MG tablet Take 1 tablet (30 mg total) by mouth daily. 90 tablet 1  . melatonin 5 MG TABS Take 1 capsule by mouth at bedtime as needed (for sleep).     . metoprolol succinate (TOPROL-XL) 25 MG 24 hr tablet Take 1 tablet (25 mg total) by mouth daily. 90 tablet 1  . Multiple Vitamin (MULTIVITAMIN) tablet Take 1 tablet by mouth daily.    . polyethylene glycol powder (GLYCOLAX/MIRALAX) 17 GM/SCOOP powder Take 17 g by mouth 2 (two) times daily as needed. 3350 g 1  . PREVIDENT 5000 BOOSTER PLUS 1.1 % PSTE See admin instructions.    Marland Kitchen rOPINIRole (REQUIP) 2 MG tablet TAKE 1 TABLET THREE TIMES DAILY 270 tablet 1  . sildenafil (REVATIO) 20 MG tablet Take 1-5 tablets (20-100 mg total) by mouth daily as needed. 90 tablet 6  . simvastatin (ZOCOR) 40 MG tablet TAKE 1 TABLET EVERY DAY 90 tablet 1  . SYSTANE ULTRA 0.4-0.3 % SOLN SMARTSIG:1 Drop(s) In Eye(s) PRN    . tamsulosin (FLOMAX) 0.4 MG CAPS capsule TAKE 1 CAPSULE EVERY DAY 90 capsule 1  . traMADol (ULTRAM) 50 MG tablet Take 1 tablet (50 mg total) by mouth every 8 (eight) hours as needed. 21 tablet 0  . traZODone (DESYREL) 50 MG tablet TAKE 1/2 TO 1 TABLET AT BEDTIME AS NEEDED FOR SLEEP 90 tablet 0  . XARELTO 20 MG TABS tablet TAKE 1 TABLET (20 MG TOTAL) BY MOUTH DAILY. 90 tablet 3   No current  facility-administered medications on file prior to visit.     Objective   There were no vitals filed for this visit. GEN:  The patient appears stated age and is in NAD.      Follow up Instructions      -I discussed the assessment and treatment plan with the patient. The patient was provided an opportunity to ask questions and all were answered. The patient agreed with the plan and demonstrated an understanding of the instructions.   The patient was advised to call back or seek an in-person evaluation if the symptoms worsen or if the condition fails to improve as anticipated.  Total time spent on today's visit was 51minutes, including both face-to-face time and nonface-to-face time.  Time included that spent on review of records (prior notes available to me/labs/imaging if pertinent), discussing treatment and goals, answering patient's questions and coordinating care.   Alonza Bogus, DO

## 2020-06-11 ENCOUNTER — Telehealth (INDEPENDENT_AMBULATORY_CARE_PROVIDER_SITE_OTHER): Payer: Medicare HMO | Admitting: Neurology

## 2020-06-11 ENCOUNTER — Ambulatory Visit: Payer: Medicare HMO

## 2020-06-11 ENCOUNTER — Other Ambulatory Visit: Payer: Self-pay

## 2020-06-11 ENCOUNTER — Telehealth: Payer: Self-pay

## 2020-06-11 ENCOUNTER — Encounter: Payer: Self-pay | Admitting: Neurology

## 2020-06-11 VITALS — Ht 70.0 in | Wt 229.0 lb

## 2020-06-11 DIAGNOSIS — M5416 Radiculopathy, lumbar region: Secondary | ICD-10-CM | POA: Diagnosis not present

## 2020-06-11 NOTE — Telephone Encounter (Signed)
Copied from [place] [id number]. Topic: General - Other >> Jun 11, 2020  2:09 PM Tessa Lerner A wrote: Reason for CRM:FYI  Patient's wife has called in to notify PCP of Patient's referral to a neurosurgeon Dr. Vertell Limber  [phone] Patient was referred to Dr. Vertell Limber by Dr. Carles Collet Patient's wife has requested to be contacted if there are any further questions

## 2020-06-11 NOTE — Telephone Encounter (Signed)
Just FYI.

## 2020-06-11 NOTE — Addendum Note (Signed)
Addended by: Armen Pickup A on: 06/11/2020 10:42 AM   Modules accepted: Orders

## 2020-06-13 ENCOUNTER — Telehealth: Payer: Self-pay | Admitting: Neurology

## 2020-06-13 NOTE — Telephone Encounter (Signed)
Patient's wife called in stating the patient is taking Tramadol and only has 6 pills left. He needs a refill. She also mentioned she is still waiting on Dr. Melven Sartorius office to call them to schedule.

## 2020-06-13 NOTE — Telephone Encounter (Signed)
Left message for Dr Melven Sartorius office to see if the referral was received and when the patient will be contacted.   Spoke with patients wife and made her aware that I left a message for Dr Donald Pore office and I'm waiting to hear back. Wife states the patient only has a few Tramadol tabs left and wants to know if Dr Tat can send in a refill.   I advised her that Dr Tat is out of the office for the day and I would send Dr Tat a message and get back to her tomorrow. She voiced understanding.

## 2020-06-14 MED ORDER — TRAMADOL HCL 50 MG PO TABS: 50.0000 mg | ORAL_TABLET | Freq: Three times a day (TID) | ORAL | 0 refills | Status: DC | PRN

## 2020-06-14 NOTE — Telephone Encounter (Signed)
Wife notified and voiced understanding.   Rx(s) sent to pharmacy electronically.

## 2020-06-14 NOTE — Telephone Encounter (Signed)
[facility]

## 2020-06-20 ENCOUNTER — Other Ambulatory Visit: Payer: Self-pay

## 2020-06-20 ENCOUNTER — Telehealth: Payer: Self-pay | Admitting: Neurology

## 2020-06-20 DIAGNOSIS — G20 Parkinson's disease: Secondary | ICD-10-CM

## 2020-06-20 DIAGNOSIS — M5416 Radiculopathy, lumbar region: Secondary | ICD-10-CM | POA: Diagnosis not present

## 2020-06-20 DIAGNOSIS — M4316 Spondylolisthesis, lumbar region: Secondary | ICD-10-CM | POA: Diagnosis not present

## 2020-06-20 DIAGNOSIS — M5136 Other intervertebral disc degeneration, lumbar region: Secondary | ICD-10-CM | POA: Diagnosis not present

## 2020-06-20 NOTE — Telephone Encounter (Signed)
Patient's daughter called in stating they need a refill of the Ropinirole sent to [facility] mail order pharmacy. He is getting low.

## 2020-06-21 MED ORDER — ROPINIROLE HCL 2 MG PO TABS: 2.0000 mg | ORAL_TABLET | Freq: Three times a day (TID) | ORAL | 0 refills | Status: DC

## 2020-06-21 NOTE — Telephone Encounter (Signed)
Spoke with wife and informed her that we sent a 26 day supply to [facility]. She wanted to know if we could send a 10 day supply to [facility] so her husband will not run out of medication.   Rx(s) sent to pharmacy electronically.

## 2020-06-21 NOTE — Telephone Encounter (Signed)
Patient's daughter is now requesting a one month prescription for ropinirole to a local pharmacy as well. The patient is about out.  [facility] [phone] [address]  She'd like a call once this has been taken care of.

## 2020-06-26 ENCOUNTER — Encounter: Payer: Medicare HMO | Admitting: Family Medicine

## 2020-06-29 ENCOUNTER — Ambulatory Visit: Payer: Medicare HMO

## 2020-06-29 DIAGNOSIS — M5126 Other intervertebral disc displacement, lumbar region: Secondary | ICD-10-CM | POA: Diagnosis not present

## 2020-06-29 DIAGNOSIS — M5416 Radiculopathy, lumbar region: Secondary | ICD-10-CM | POA: Diagnosis not present

## 2020-07-04 ENCOUNTER — Other Ambulatory Visit: Payer: Self-pay | Admitting: Family Medicine

## 2020-07-04 NOTE — Telephone Encounter (Signed)
Requested Prescriptions  Pending Prescriptions Disp Refills  . traZODone (DESYREL) 50 MG tablet [Pharmacy Med Name: TRAZODONE HYDROCHLORIDE 50 MG Tablet] 90 tablet 1    Sig: TAKE 1/2 TO 1 TABLET AT BEDTIME AS NEEDED FOR SLEEP     Psychiatry: Antidepressants - Serotonin Modulator Passed - 07/04/2020 11:04 PM      Passed - Valid encounter within last 6 months    Recent Outpatient Visits          1 month ago Chronic bilateral low back pain, unspecified whether sciatica present   [facility], Megan P, DO   4 months ago Acute low back pain, unspecified back pain laterality, unspecified whether sciatica present   [facility], Megan P, DO   4 months ago Acute low back pain, unspecified back pain laterality, unspecified whether sciatica present   [facility], Megan P, DO   6 months ago Essential hypertension   [facility], Megan P, DO   1 year ago Routine general medical examination at a health care facility   [facility], Barb Merino, DO      Future Appointments            In 6 days [name], Barb Merino, DO [place], [place]   In 1 week  [employer], [place]

## 2020-07-05 ENCOUNTER — Telehealth: Payer: Self-pay | Admitting: Neurology

## 2020-07-05 NOTE — Telephone Encounter (Signed)
Patient's wife called in stating they have a friend diagnosed with Parkinson's that lives in [place]. They are wondering if Dr. Carles Collet can recommend anyone that is a great neurologist for their friend? They are willing to travel as far as [place]. They want to make sure their friend gets good care like Dr. Carles Collet has provided for [facility].

## 2020-07-05 NOTE — Telephone Encounter (Signed)
That is really kind!  Unfortunately I really don't know anyone personally in that area.  They have a center of excellence at [facility] in [place] but I don't know the physicians personally.  I think that [place] has several movement physicians as well but again, I don't know them personally.  Sorry!

## 2020-07-06 NOTE — Telephone Encounter (Signed)
Spoke with patients wife and gave her Dr Doristine Devoid recommendations. She voiced understanding and thanked me for getting back to her.

## 2020-07-09 DIAGNOSIS — M4300 Spondylolysis, site unspecified: Secondary | ICD-10-CM | POA: Diagnosis not present

## 2020-07-09 DIAGNOSIS — M545 Low back pain, unspecified: Secondary | ICD-10-CM | POA: Diagnosis not present

## 2020-07-09 DIAGNOSIS — G20 Parkinson's disease: Secondary | ICD-10-CM | POA: Diagnosis not present

## 2020-07-09 DIAGNOSIS — M5416 Radiculopathy, lumbar region: Secondary | ICD-10-CM | POA: Diagnosis not present

## 2020-07-09 DIAGNOSIS — M5126 Other intervertebral disc displacement, lumbar region: Secondary | ICD-10-CM | POA: Diagnosis not present

## 2020-07-09 DIAGNOSIS — G8929 Other chronic pain: Secondary | ICD-10-CM | POA: Diagnosis not present

## 2020-07-09 DIAGNOSIS — M4317 Spondylolisthesis, lumbosacral region: Secondary | ICD-10-CM | POA: Diagnosis not present

## 2020-07-09 DIAGNOSIS — M4126 Other idiopathic scoliosis, lumbar region: Secondary | ICD-10-CM | POA: Diagnosis not present

## 2020-07-10 ENCOUNTER — Ambulatory Visit (INDEPENDENT_AMBULATORY_CARE_PROVIDER_SITE_OTHER): Payer: Medicare HMO | Admitting: Family Medicine

## 2020-07-10 ENCOUNTER — Other Ambulatory Visit: Payer: Self-pay

## 2020-07-10 ENCOUNTER — Encounter: Payer: Self-pay | Admitting: Family Medicine

## 2020-07-10 VITALS — BP 124/69 | HR 61 | Temp 98.0°F | Ht 69.5 in | Wt 230.0 lb

## 2020-07-10 DIAGNOSIS — G20 Parkinson's disease: Secondary | ICD-10-CM

## 2020-07-10 DIAGNOSIS — Z0000 Encounter for general adult medical examination without abnormal findings: Secondary | ICD-10-CM | POA: Diagnosis not present

## 2020-07-10 DIAGNOSIS — I480 Paroxysmal atrial fibrillation: Secondary | ICD-10-CM | POA: Diagnosis not present

## 2020-07-10 DIAGNOSIS — G20A1 Parkinson's disease without dyskinesia, without mention of fluctuations: Secondary | ICD-10-CM

## 2020-07-10 DIAGNOSIS — G4701 Insomnia due to medical condition: Secondary | ICD-10-CM

## 2020-07-10 DIAGNOSIS — E7800 Pure hypercholesterolemia, unspecified: Secondary | ICD-10-CM

## 2020-07-10 DIAGNOSIS — I10 Essential (primary) hypertension: Secondary | ICD-10-CM

## 2020-07-10 DIAGNOSIS — I82502 Chronic embolism and thrombosis of unspecified deep veins of left lower extremity: Secondary | ICD-10-CM | POA: Diagnosis not present

## 2020-07-10 DIAGNOSIS — N401 Enlarged prostate with lower urinary tract symptoms: Secondary | ICD-10-CM | POA: Diagnosis not present

## 2020-07-10 DIAGNOSIS — M545 Low back pain, unspecified: Secondary | ICD-10-CM

## 2020-07-10 DIAGNOSIS — R350 Frequency of micturition: Secondary | ICD-10-CM | POA: Diagnosis not present

## 2020-07-10 DIAGNOSIS — D6851 Activated protein C resistance: Secondary | ICD-10-CM | POA: Diagnosis not present

## 2020-07-10 DIAGNOSIS — G8929 Other chronic pain: Secondary | ICD-10-CM

## 2020-07-10 LAB — URINALYSIS, ROUTINE W REFLEX MICROSCOPIC
Bilirubin, UA: NEGATIVE
Glucose, UA: NEGATIVE
Leukocytes,UA: NEGATIVE
Nitrite, UA: NEGATIVE
Protein,UA: NEGATIVE
RBC, UA: NEGATIVE
Specific Gravity, UA: >1.03 — ABNORMAL HIGH (ref 1.005–1.030)
Urobilinogen, Ur: 1 mg/dL (ref 0.2–1.0)
pH, UA: 6 (ref 5.0–7.5)

## 2020-07-10 NOTE — Progress Notes (Signed)
BP 124/69   Pulse 61   Temp 98 F (36.7 C)   Ht 5' 9.5" (1.765 m)   Wt 230 lb (104.3 kg)   SpO2 98%   BMI 33.48 kg/m    Subjective:    Patient ID: Thomas [name], male    DOB: 1950/12/07, 70 y.o.   MRN: [id number]  HPI: Thomas [name] is a 70 y.o. male presenting on 07/10/2020 for comprehensive medical examination. Current medical complaints include:  Back is still acting up. Had an epidural shot and it was much better. About 50% better.   HYPERTENSION / HYPERLIPIDEMIA Satisfied with current treatment? yes Duration of hypertension: chronic BP monitoring frequency: not checking BP medication side effects: no Past BP meds: hydralazine, lisinopril, metoprolol, amlodipine Duration of hyperlipidemia: chronic Cholesterol medication side effects: not on anything Cholesterol supplements: none Medication compliance: excellent compliance Aspirin: no Recent stressors: yes Recurrent headaches: no Visual changes: no Palpitations: no Dyspnea: no Chest pain: no Lower extremity edema: no Dizzy/lightheaded: no  BPH BPH status: controlled Satisfied with current treatment?: yes Medication side effects: no Medication compliance: excellent compliance Duration: chronic Nocturia: no Urinary frequency:no Incomplete voiding: no Urgency: no Weak urinary stream: no Straining to start stream: no Dysuria: no Onset: gradual Severity: mild  He currently lives with: wife Interim Problems from his last visit: no  Depression Screen done today and results listed below:  Depression screen [facility] 2/9 07/10/2020 06/28/2019 06/06/2019 03/30/2019 12/21/2018  Decreased Interest 1 0 0 1 0  Down, Depressed, Hopeless 0 0 1 0 0  PHQ - 2 Score 1 0 1 1 0  Altered sleeping - 0 - 0 0  Tired, decreased energy - 0 - 0 1  Change in appetite - 0 - 0 0  Feeling bad or failure about yourself  - 0 - 0 0  Trouble concentrating - 0 - 3 3  Moving slowly or fidgety/restless - 0 - 0 0  Suicidal thoughts - 0 - 0 0   PHQ-9 Score - 0 - 4 4  Difficult doing work/chores - Not difficult at all - Not difficult at all Not difficult at all    Past Medical History:  Past Medical History:  Diagnosis Date  . AF (atrial fibrillation) ([place])   . Allergy   . Arthritis    Osteoarthritis  . Arthritis of knee   . Atrial fibrillation ([place])   . DVT (deep venous thrombosis) ([facility])    L leg  . Hyperchloremia   . Hypercholesterolemia   . Hypertension   . Nephrolithiasis   . Parkinson's disease ([place])   . Plantar wart   . Pulmonary embolism ([place])   . Sciatica   . Seasonal allergies   . TIA (transient ischemic attack)     Surgical History:  Past Surgical History:  Procedure Laterality Date  . APPENDECTOMY    . CATARACT EXTRACTION Bilateral 10/2019 11/2019  . COLONOSCOPY WITH PROPOFOL N/A 07/27/2019   Procedure: COLONOSCOPY WITH PROPOFOL;  Surgeon: Lin Landsman, MD;  Location[facility] ENDOSCOPY;  Service: Endoscopy;  Laterality: N/A;  . KNEE ARTHROSCOPY WITH MENISCAL REPAIR Right 2009  . KNEE SURGERY Right   . T&A    . TONSILLECTOMY      Medications:  Current Outpatient Medications on File Prior to Visit  Medication Sig  . acetaminophen (TYLENOL) 325 MG tablet Take 325 mg by mouth every 6 (six) hours as needed.  Marland Kitchen amantadine (SYMMETREL) 100 MG capsule TAKE 1 CAPSULE THREE TIMES DAILY  . carbidopa-levodopa (SINEMET  IR) 25-100 MG tablet TAKE 2 TABLETS AT 6AM, 10AM, AND 2PM, AND TAKE 1 TABLET AT 6PM AS DIRECTED. CAN TAKE EXTRA 1/2 TABLET-1 TABLET AS NEEDED.  Marland Kitchen cetirizine (ZYRTEC) 10 MG tablet Take 10 mg by mouth daily.  . cholecalciferol (VITAMIN D3) 25 MCG (1000 UNIT) tablet Take 1,000 Units by mouth daily.  . diclofenac Sodium (VOLTAREN) 1 % GEL APPLY TOPICALLY 4 GRAMS FOUR TIMES DAILY  . hydrALAZINE (APRESOLINE) 25 MG tablet Take 1 tablet (25 mg total) by mouth 3 (three) times daily as needed (for SBP >160).  . melatonin 5 MG TABS Take 1 capsule by mouth at bedtime as needed (for sleep).   . Multiple  Vitamin (MULTIVITAMIN) tablet Take 1 tablet by mouth daily.  . polyethylene glycol powder (GLYCOLAX/MIRALAX) 17 GM/SCOOP powder Take 17 g by mouth 2 (two) times daily as needed.  Marland Kitchen PREVIDENT 5000 BOOSTER PLUS 1.1 % PSTE See admin instructions.  . sildenafil (REVATIO) 20 MG tablet Take 1-5 tablets (20-100 mg total) by mouth daily as needed.  . SYSTANE ULTRA 0.4-0.3 % SOLN SMARTSIG:1 Drop(s) In Eye(s) PRN  . traZODone (DESYREL) 50 MG tablet TAKE 1/2 TO 1 TABLET AT BEDTIME AS NEEDED FOR SLEEP  . XARELTO 20 MG TABS tablet TAKE 1 TABLET (20 MG TOTAL) BY MOUTH DAILY.  Marland Kitchen rOPINIRole (REQUIP) 2 MG tablet Take 1 tablet (2 mg total) by mouth 3 (three) times daily for 10 days.   No current facility-administered medications on file prior to visit.    Allergies:  Allergies  Allergen Reactions  . Codeine Other (See Comments)    Reaction: Hallucinations    Social History:  Social History   Socioeconomic History  . Marital status: Married    Spouse name: [name]  . Number of children: 0  . Years of education: Not on file  . Highest education level: Some college, no degree  Occupational History  . Occupation: retired    Comment: machinest  Tobacco Use  . Smoking status: Never Smoker  . Smokeless tobacco: Never Used  Vaping Use  . Vaping Use: Never used  Substance and Sexual Activity  . Alcohol use: No    Alcohol/week: 0.0 standard drinks  . Drug use: No  . Sexual activity: Not on file  Other Topics Concern  . Not on file  Social History Narrative   ** Merged History Encounter **    right handed   Two story home    Live with spouse   Social Determinants of Health   Financial Resource Strain: Not on file  Food Insecurity: Not on file  Transportation Needs: No Transportation Needs  . Lack of Transportation (Medical): No  . Lack of Transportation (Non-Medical): No  Physical Activity: Sufficiently Active  . Days of Exercise per Week: 3 days  . Minutes of Exercise per Session: 80 min   Stress: No Stress Concern Present  . Feeling of Stress : Not at all  Social Connections: Socially Integrated  . Frequency of Communication with Friends and Family: More than three times a week  . Frequency of Social Gatherings with Friends and Family: More than three times a week  . Attends Religious Services: More than 4 times per year  . Active Member of Clubs or Organizations: Yes  . Attends Archivist Meetings: More than 4 times per year  . Marital Status: Married  Human resources officer Violence: Not At Risk  . Fear of Current or Ex-Partner: No  . Emotionally Abused: No  . Physically Abused:  No  . Sexually Abused: No   Social History   Tobacco Use  Smoking Status Never Smoker  Smokeless Tobacco Never Used   Social History   Substance and Sexual Activity  Alcohol Use No  . Alcohol/week: 0.0 standard drinks    Family History:  Family History  Problem Relation Age of Onset  . Parkinson's disease Father   . Coronary artery disease Mother   . Heart attack Sister   . Deep vein thrombosis Sister     Past medical history, surgical history, medications, allergies, family history and social history reviewed with patient today and changes made to appropriate areas of the chart.   Review of Systems  Constitutional: Negative.   HENT: Negative.   Eyes: Negative.   Respiratory: Negative.   Cardiovascular: Negative.   Gastrointestinal: Positive for constipation. Negative for abdominal pain, blood in stool, diarrhea, heartburn, melena, nausea and vomiting.  Genitourinary: Negative.   Musculoskeletal: Negative.   Skin: Negative.   Neurological: Positive for tremors and weakness. Negative for dizziness, tingling, sensory change, speech change, focal weakness, seizures, loss of consciousness and headaches.  Endo/Heme/Allergies: Negative for environmental allergies and polydipsia. Does not bruise/bleed easily.  Psychiatric/Behavioral: Positive for depression. Negative for  hallucinations, memory loss, substance abuse and suicidal ideas. The patient is not nervous/anxious and does not have insomnia.    All other ROS negative except what is listed above and in the HPI.      Objective:    BP 124/69   Pulse 61   Temp 98 F (36.7 C)   Ht 5' 9.5" (1.765 m)   Wt 230 lb (104.3 kg)   SpO2 98%   BMI 33.48 kg/m   Wt Readings from Last 3 Encounters:  07/10/20 230 lb (104.3 kg)  06/11/20 229 lb (103.9 kg)  06/05/20 229 lb (103.9 kg)    Physical Exam Vitals and nursing note reviewed.  Constitutional:      General: He is not in acute distress.    Appearance: Normal appearance. He is obese. He is not ill-appearing, toxic-appearing or diaphoretic.  HENT:     Head: Normocephalic and atraumatic.     Right Ear: Tympanic membrane, ear canal and external ear normal. There is no impacted cerumen.     Left Ear: Tympanic membrane, ear canal and external ear normal. There is no impacted cerumen.     Nose: Nose normal. No congestion or rhinorrhea.     Mouth/Throat:     Mouth: Mucous membranes are moist.     Pharynx: Oropharynx is clear. No oropharyngeal exudate or posterior oropharyngeal erythema.  Eyes:     General: No scleral icterus.       Right eye: No discharge.        Left eye: No discharge.     Extraocular Movements: Extraocular movements intact.     Conjunctiva/sclera: Conjunctivae normal.     Pupils: Pupils are equal, round, and reactive to light.  Neck:     Vascular: No carotid bruit.  Cardiovascular:     Rate and Rhythm: Normal rate and regular rhythm.     Pulses: Normal pulses.     Heart sounds: No murmur heard. No friction rub. No gallop.   Pulmonary:     Effort: Pulmonary effort is normal. No respiratory distress.     Breath sounds: Normal breath sounds. No stridor. No wheezing, rhonchi or rales.  Chest:     Chest wall: No tenderness.  Abdominal:     General: Abdomen is flat. Bowel  sounds are normal. There is no distension.     Palpations:  Abdomen is soft. There is no mass.     Tenderness: There is no abdominal tenderness. There is no right CVA tenderness, left CVA tenderness, guarding or rebound.     Hernia: No hernia is present.  Genitourinary:    Comments: Genital exam deferred with shared decision making Musculoskeletal:        General: No swelling, tenderness, deformity or signs of injury.     Cervical back: Normal range of motion and neck supple. No rigidity. No muscular tenderness.     Right lower leg: No edema.     Left lower leg: No edema.  Lymphadenopathy:     Cervical: No cervical adenopathy.  Skin:    General: Skin is warm and dry.     Capillary Refill: Capillary refill takes less than 2 seconds.     Coloration: Skin is not jaundiced or pale.     Findings: No bruising, erythema, lesion or rash.  Neurological:     General: No focal deficit present.     Mental Status: He is alert and oriented to person, place, and time.     Cranial Nerves: No cranial nerve deficit.     Sensory: No sensory deficit.     Motor: No weakness.     Coordination: Coordination abnormal.     Gait: Gait normal.     Deep Tendon Reflexes: Reflexes normal.  Psychiatric:        Mood and Affect: Mood normal.        Behavior: Behavior normal.        Thought Content: Thought content normal.        Judgment: Judgment normal.     Results for orders placed or performed in visit on 07/10/20  Comprehensive metabolic panel  Result Value Ref Range   Glucose 92 65 - 99 mg/dL   BUN 19 8 - 27 mg/dL   Creatinine, Ser 1.07 0.76 - 1.27 mg/dL   eGFR 74 >59 mL/min/1.73   BUN/Creatinine Ratio 18 10 - 24   Sodium 140 134 - 144 mmol/L   Potassium 4.0 3.5 - 5.2 mmol/L   Chloride 105 96 - 106 mmol/L   CO2 22 20 - 29 mmol/L   Calcium 9.4 8.6 - 10.2 mg/dL   Total Protein 6.6 6.0 - 8.5 g/dL   Albumin 4.2 3.7 - 4.7 g/dL   Globulin, Total 2.4 1.5 - 4.5 g/dL   Albumin/Globulin Ratio 1.8 1.2 - 2.2   Bilirubin Total 0.4 0.0 - 1.2 mg/dL   Alkaline  Phosphatase 94 44 - 121 IU/L   AST 10 0 - 40 IU/L   ALT 5 0 - 44 IU/L  CBC with Differential/Platelet  Result Value Ref Range   WBC 5.1 3.4 - 10.8 x10E3/uL   RBC 4.74 4.14 - 5.80 x10E6/uL   Hemoglobin 14.7 13.0 - 17.7 g/dL   Hematocrit 43.1 37.5 - 51.0 %   MCV 91 79 - 97 fL   MCH 31.0 26.6 - 33.0 pg   MCHC 34.1 31.5 - 35.7 g/dL   RDW 11.8 11.6 - 15.4 %   Platelets 147 (L) 150 - 450 x10E3/uL   Neutrophils 69 Not Estab. %   Lymphs 18 Not Estab. %   Monocytes 10 Not Estab. %   Eos 2 Not Estab. %   Basos 1 Not Estab. %   Neutrophils Absolute 3.5 1.4 - 7.0 x10E3/uL   Lymphocytes Absolute 0.9 0.7 - 3.1 x10E3/uL  Monocytes Absolute 0.5 0.1 - 0.9 x10E3/uL   EOS (ABSOLUTE) 0.1 0.0 - 0.4 x10E3/uL   Basophils Absolute 0.1 0.0 - 0.2 x10E3/uL   Immature Granulocytes 0 Not Estab. %   Immature Grans (Abs) 0.0 0.0 - 0.1 x10E3/uL  Lipid Panel w/o Chol/HDL Ratio  Result Value Ref Range   Cholesterol, Total 127 100 - 199 mg/dL   Triglycerides 67 0 - 149 mg/dL   HDL 53 >39 mg/dL   VLDL Cholesterol Cal 14 5 - 40 mg/dL   LDL Chol Calc (NIH) 60 0 - 99 mg/dL  PSA  Result Value Ref Range   Prostate Specific Ag, Serum 1.7 0.0 - 4.0 ng/mL  TSH  Result Value Ref Range   TSH 1.130 0.450 - 4.500 uIU/mL  Urinalysis, Routine w reflex microscopic  Result Value Ref Range   Specific Gravity, UA >1.030 (H) 1.005 - 1.030   pH, UA 6.0 5.0 - 7.5   Color, UA Yellow Yellow   Appearance Ur Clear Clear   Leukocytes,UA Negative Negative   Protein,UA Negative Negative/Trace   Glucose, UA Negative Negative   Ketones, UA 1+ (A) Negative   RBC, UA Negative Negative   Bilirubin, UA Negative Negative   Urobilinogen, Ur 1.0 0.2 - 1.0 mg/dL   Nitrite, UA Negative Negative      Assessment & Plan:   Problem List Items Addressed This Visit      Cardiovascular and Mediastinum   Hypertension    Under good control on current regimen. Continue current regimen. Continue to monitor. Call with any concerns. Refills  given. Labs drawn today.        Relevant Medications   simvastatin (ZOCOR) 40 MG tablet   metoprolol succinate (TOPROL-XL) 25 MG 24 hr tablet   lisinopril (ZESTRIL) 30 MG tablet   amLODipine (NORVASC) 2.5 MG tablet   Other Relevant Orders   Comprehensive metabolic panel (Completed)   CBC with Differential/Platelet (Completed)   TSH (Completed)   DVT (deep venous thrombosis) ([facility])    Under good control on current regimen. Continue current regimen. Continue to monitor. Call with any concerns. Refills given. Labs drawn today.        Relevant Medications   simvastatin (ZOCOR) 40 MG tablet   metoprolol succinate (TOPROL-XL) 25 MG 24 hr tablet   lisinopril (ZESTRIL) 30 MG tablet   amLODipine (NORVASC) 2.5 MG tablet   Other Relevant Orders   Comprehensive metabolic panel (Completed)   CBC with Differential/Platelet (Completed)   Atrial fibrillation ([facility])    Under good control on current regimen. Continue current regimen. Continue to monitor. Call with any concerns. Refills given. Labs drawn today.        Relevant Medications   simvastatin (ZOCOR) 40 MG tablet   metoprolol succinate (TOPROL-XL) 25 MG 24 hr tablet   lisinopril (ZESTRIL) 30 MG tablet   amLODipine (NORVASC) 2.5 MG tablet     Nervous and Auditory   Parkinson's disease ([facility])    Stable. Continue to follow with neurology. Call with any concerns. Continue to monitor.       Relevant Orders   Comprehensive metabolic panel (Completed)   CBC with Differential/Platelet (Completed)     Genitourinary   BPH (benign prostatic hyperplasia)    Under good control on current regimen. Continue current regimen. Continue to monitor. Call with any concerns. Refills given. Labs drawn today.        Relevant Medications   tamsulosin (FLOMAX) 0.4 MG CAPS capsule   dutasteride (AVODART) 0.5 MG capsule  Other Relevant Orders   Comprehensive metabolic panel (Completed)   CBC with Differential/Platelet (Completed)   PSA (Completed)    Urinalysis, Routine w reflex microscopic (Completed)     Hematopoietic and Hemostatic   Activated protein C resistance ([facility])    Continue to follow with hematology. Call with any concerns. Continue xarelto.         Other   Hypercholesterolemia    Rechecking labs today. Await results. Treat as needed.       Relevant Medications   simvastatin (ZOCOR) 40 MG tablet   metoprolol succinate (TOPROL-XL) 25 MG 24 hr tablet   lisinopril (ZESTRIL) 30 MG tablet   amLODipine (NORVASC) 2.5 MG tablet   Other Relevant Orders   Comprehensive metabolic panel (Completed)   CBC with Differential/Platelet (Completed)   Lipid Panel w/o Chol/HDL Ratio (Completed)   Insomnia due to medical condition    Under good control on current regimen. Continue current regimen. Continue to monitor. Call with any concerns. Refills given. Labs drawn today.        Chronic bilateral low back pain    Following with physiatry. Doing much better. Continue tramadol PRN- refills for 3 months given today. Hopefully will be able to decrease use as he feels better. Call with any concerns.       Relevant Medications   traMADol (ULTRAM) 50 MG tablet    Other Visit Diagnoses    Routine general medical examination at a health care facility    -  Primary   Vaccines up to date. Screening labs checked today. Colonoscopy up to date. Continue diet and exercise. Continue to monitor. Call with any concerns.        Discussed aspirin prophylaxis for myocardial infarction prevention and decision was it was not indicated  LABORATORY TESTING:  Health maintenance labs ordered today as discussed above.   The natural history of prostate cancer and ongoing controversy regarding screening and potential treatment outcomes of prostate cancer has been discussed with the patient. The meaning of a false positive PSA and a false negative PSA has been discussed. He indicates understanding of the limitations of this screening test and wishes to  proceed with screening PSA testing.   IMMUNIZATIONS:   - Tdap: Tetanus vaccination status reviewed: last tetanus booster within 10 years. - Influenza: Up to date - Pneumovax: Up to date - Prevnar: Up to date - COVID: Up to date  SCREENING: - Colonoscopy: Up to date  Discussed with patient purpose of the colonoscopy is to detect colon cancer at curable precancerous or early stages   PATIENT COUNSELING:    Sexuality: Discussed sexually transmitted diseases, partner selection, use of condoms, avoidance of unintended pregnancy  and contraceptive alternatives.   Advised to avoid cigarette smoking.  I discussed with the patient that most people either abstain from alcohol or drink within safe limits (<=14/week and <=4 drinks/occasion for males, <=7/weeks and <= 3 drinks/occasion for females) and that the risk for alcohol disorders and other health effects rises proportionally with the number of drinks per week and how often a drinker exceeds daily limits.  Discussed cessation/primary prevention of drug use and availability of treatment for abuse.   Diet: Encouraged to adjust caloric intake to maintain  or achieve ideal body weight, to reduce intake of dietary saturated fat and total fat, to limit sodium intake by avoiding high sodium foods and not adding table salt, and to maintain adequate dietary potassium and calcium preferably from fresh fruits, vegetables, and low-fat dairy products.  stressed the importance of regular exercise  Injury prevention: Discussed safety belts, safety helmets, smoke detector, smoking near bedding or upholstery.   Dental health: Discussed importance of regular tooth brushing, flossing, and dental visits.   Follow up plan: NEXT PREVENTATIVE PHYSICAL DUE IN 1 YEAR. Return in about 3 months (around 10/10/2020).

## 2020-07-11 ENCOUNTER — Telehealth: Payer: Self-pay | Admitting: Family Medicine

## 2020-07-11 LAB — LIPID PANEL W/O CHOL/HDL RATIO
Cholesterol, Total: 127 mg/dL (ref 100–199)
HDL: 53 mg/dL (ref >39)
LDL Chol Calc (NIH): 60 mg/dL (ref 0–99)
Triglycerides: 67 mg/dL (ref 0–149)
VLDL Cholesterol Cal: 14 mg/dL (ref 5–40)

## 2020-07-11 LAB — COMPREHENSIVE METABOLIC PANEL
ALT: 5 IU/L (ref 0–44)
AST: 10 IU/L (ref 0–40)
Albumin/Globulin Ratio: 1.8 (ref 1.2–2.2)
Albumin: 4.2 g/dL (ref 3.7–4.7)
Alkaline Phosphatase: 94 IU/L (ref 44–121)
BUN/Creatinine Ratio: 18 (ref 10–24)
BUN: 19 mg/dL (ref 8–27)
Bilirubin Total: 0.4 mg/dL (ref 0.0–1.2)
CO2: 22 mmol/L (ref 20–29)
Calcium: 9.4 mg/dL (ref 8.6–10.2)
Chloride: 105 mmol/L (ref 96–106)
Creatinine, Ser: 1.07 mg/dL (ref 0.76–1.27)
Globulin, Total: 2.4 g/dL (ref 1.5–4.5)
Glucose: 92 mg/dL (ref 65–99)
Potassium: 4 mmol/L (ref 3.5–5.2)
Sodium: 140 mmol/L (ref 134–144)
Total Protein: 6.6 g/dL (ref 6.0–8.5)
eGFR: 74 mL/min/{1.73_m2} (ref >59)

## 2020-07-11 LAB — CBC WITH DIFFERENTIAL/PLATELET
Basophils Absolute: 0.1 10*3/uL (ref 0.0–0.2)
Basos: 1 %
EOS (ABSOLUTE): 0.1 10*3/uL (ref 0.0–0.4)
Eos: 2 %
Hematocrit: 43.1 % (ref 37.5–51.0)
Hemoglobin: 14.7 g/dL (ref 13.0–17.7)
Immature Grans (Abs): 0 10*3/uL (ref 0.0–0.1)
Immature Granulocytes: 0 %
Lymphocytes Absolute: 0.9 10*3/uL (ref 0.7–3.1)
Lymphs: 18 %
MCH: 31 pg (ref 26.6–33.0)
MCHC: 34.1 g/dL (ref 31.5–35.7)
MCV: 91 fL (ref 79–97)
Monocytes Absolute: 0.5 10*3/uL (ref 0.1–0.9)
Monocytes: 10 %
Neutrophils Absolute: 3.5 10*3/uL (ref 1.4–7.0)
Neutrophils: 69 %
Platelets: 147 10*3/uL — ABNORMAL LOW (ref 150–450)
RBC: 4.74 x10E6/uL (ref 4.14–5.80)
RDW: 11.8 % (ref 11.6–15.4)
WBC: 5.1 10*3/uL (ref 3.4–10.8)

## 2020-07-11 LAB — PSA: Prostate Specific Ag, Serum: 1.7 ng/mL (ref 0.0–4.0)

## 2020-07-11 LAB — TSH: TSH: 1.13 u[IU]/mL (ref 0.450–4.500)

## 2020-07-11 NOTE — Telephone Encounter (Signed)
Pts wife calling stating that the pt had an appt with PCP yesterday there were supposed to be new medications sent in. She states that they went to the pharmacy and that there were no medications on file. Please advise.      [id number] - [name], [place] AT [address] [place]  [address] [place] [address]  Phone: [phone] Fax: [phone]  Hours: Not open 24 hours

## 2020-07-12 ENCOUNTER — Other Ambulatory Visit: Payer: Self-pay | Admitting: Neurology

## 2020-07-12 MED ORDER — LISINOPRIL 30 MG PO TABS: 30.0000 mg | ORAL_TABLET | Freq: Every day | ORAL | 1 refills | Status: DC

## 2020-07-12 MED ORDER — TAMSULOSIN HCL 0.4 MG PO CAPS: 0.4000 mg | ORAL_CAPSULE | Freq: Every day | ORAL | 1 refills | Status: DC

## 2020-07-12 MED ORDER — DUTASTERIDE 0.5 MG PO CAPS: 0.5000 mg | ORAL_CAPSULE | Freq: Every day | ORAL | 3 refills | Status: DC

## 2020-07-12 MED ORDER — SIMVASTATIN 40 MG PO TABS: 40.0000 mg | ORAL_TABLET | Freq: Every day | ORAL | 1 refills | Status: DC

## 2020-07-12 MED ORDER — TRAMADOL HCL 50 MG PO TABS: 50.0000 mg | ORAL_TABLET | Freq: Two times a day (BID) | ORAL | 2 refills | Status: AC | PRN

## 2020-07-12 MED ORDER — AMLODIPINE BESYLATE 2.5 MG PO TABS: 1.2500 mg | ORAL_TABLET | Freq: Every day | ORAL | 1 refills | Status: DC | PRN

## 2020-07-12 MED ORDER — METOPROLOL SUCCINATE ER 25 MG PO TB24: 25.0000 mg | ORAL_TABLET | Freq: Every day | ORAL | 1 refills | Status: DC

## 2020-07-12 NOTE — Telephone Encounter (Signed)
Rx(s) sent to pharmacy electronically.  

## 2020-07-12 NOTE — Telephone Encounter (Signed)
Patients wife notified

## 2020-07-12 NOTE — Telephone Encounter (Signed)
They should be there- sorry there was an issue with the computer

## 2020-07-13 ENCOUNTER — Ambulatory Visit (INDEPENDENT_AMBULATORY_CARE_PROVIDER_SITE_OTHER): Payer: Medicare HMO

## 2020-07-13 VITALS — Ht 69.5 in | Wt 230.0 lb

## 2020-07-13 DIAGNOSIS — Z0000 Encounter for general adult medical examination without abnormal findings: Secondary | ICD-10-CM

## 2020-07-13 NOTE — Assessment & Plan Note (Signed)
Following with physiatry. Doing much better. Continue tramadol PRN- refills for 3 months given today. Hopefully will be able to decrease use as he feels better. Call with any concerns.

## 2020-07-13 NOTE — Assessment & Plan Note (Signed)
Continue to follow with hematology. Call with any concerns. Continue xarelto.

## 2020-07-13 NOTE — Assessment & Plan Note (Signed)
Under good control on current regimen. Continue current regimen. Continue to monitor. Call with any concerns. Refills given. Labs drawn today.   

## 2020-07-13 NOTE — Assessment & Plan Note (Signed)
Rechecking labs today. Await results. Treat as needed.  °

## 2020-07-13 NOTE — Patient Instructions (Signed)
[name] , Thank you for taking time to come for your Medicare Wellness Visit. I appreciate your ongoing commitment to your health goals. Please review the following plan we discussed and let me know if I can assist you in the future.   Screening recommendations/referrals: Colonoscopy: completed 07/27/2019, due 07/27/2022 Recommended yearly ophthalmology/optometry visit for glaucoma screening and checkup Recommended yearly dental visit for hygiene and checkup  Vaccinations: Influenza vaccine: completed 12/27/2019, due 11/12/2020 Pneumococcal vaccine: completed 05/01/2017 Tdap vaccine: completed 02/16/2014, due 02/17/2024 Shingles vaccine: discussed   Covid-19:  06/27/2019, 06/06/2019  Advanced directives: copy in chart  Conditions/risks identified: none  Next appointment: Follow up in one year for your annual wellness visit.   Preventive Care 71 Years and Older, Male Preventive care refers to lifestyle choices and visits with your health care provider that can promote health and wellness. What does preventive care include?  A yearly physical exam. This is also called an annual well check.  Dental exams once or twice a year.  Routine eye exams. Ask your health care provider how often you should have your eyes checked.  Personal lifestyle choices, including:  Daily care of your teeth and gums.  Regular physical activity.  Eating a healthy diet.  Avoiding tobacco and drug use.  Limiting alcohol use.  Practicing safe sex.  Taking low doses of aspirin every day.  Taking vitamin and mineral supplements as recommended by your health care provider. What happens during an annual well check? The services and screenings done by your health care provider during your annual well check will depend on your age, overall health, lifestyle risk factors, and family history of disease. Counseling  Your health care provider may ask you questions about your:  Alcohol use.  Tobacco  use.  Drug use.  Emotional well-being.  Home and relationship well-being.  Sexual activity.  Eating habits.  History of falls.  Memory and ability to understand (cognition).  Work and work Statistician. Screening  You may have the following tests or measurements:  Height, weight, and BMI.  Blood pressure.  Lipid and cholesterol levels. These may be checked every 5 years, or more frequently if you are over 71 years old.  Skin check.  Lung cancer screening. You may have this screening every year starting at age 71 if you have a 30-pack-year history of smoking and currently smoke or have quit within the past 15 years.  Fecal occult blood test (FOBT) of the stool. You may have this test every year starting at age 71.  Flexible sigmoidoscopy or colonoscopy. You may have a sigmoidoscopy every 5 years or a colonoscopy every 10 years starting at age 71.  Prostate cancer screening. Recommendations will vary depending on your family history and other risks.  Hepatitis C blood test.  Hepatitis B blood test.  Sexually transmitted disease (STD) testing.  Diabetes screening. This is done by checking your blood sugar (glucose) after you have not eaten for a while (fasting). You may have this done every 1-3 years.  Abdominal aortic aneurysm (AAA) screening. You may need this if you are a current or former smoker.  Osteoporosis. You may be screened starting at age 71 if you are at high risk. Talk with your health care provider about your test results, treatment options, and if necessary, the need for more tests. Vaccines  Your health care provider may recommend certain vaccines, such as:  Influenza vaccine. This is recommended every year.  Tetanus, diphtheria, and acellular pertussis (Tdap, Td) vaccine. You  may need a Td booster every 10 years.  Zoster vaccine. You may need this after age 71.  Pneumococcal 13-valent conjugate (PCV13) vaccine. One dose is recommended after age  71.  Pneumococcal polysaccharide (PPSV23) vaccine. One dose is recommended after age 71. Talk to your health care provider about which screenings and vaccines you need and how often you need them. This information is not intended to replace advice given to you by your health care provider. Make sure you discuss any questions you have with your health care provider. Document Released: 04/27/2015 Document Revised: 12/19/2015 Document Reviewed: 01/30/2015 Elsevier Interactive Patient Education  2017 [place] Prevention in the Home Falls can cause injuries. They can happen to people of all ages. There are many things you can do to make your home safe and to help prevent falls. What can I do on the outside of my home?  Regularly fix the edges of walkways and driveways and fix any cracks.  Remove anything that might make you trip as you walk through a door, such as a raised step or threshold.  Trim any bushes or trees on the path to your home.  Use bright outdoor lighting.  Clear any walking paths of anything that might make someone trip, such as rocks or tools.  Regularly check to see if handrails are loose or broken. Make sure that both sides of any steps have handrails.  Any raised decks and porches should have guardrails on the edges.  Have any leaves, snow, or ice cleared regularly.  Use sand or salt on walking paths during winter.  Clean up any spills in your garage right away. This includes oil or grease spills. What can I do in the bathroom?  Use night lights.  Install grab bars by the toilet and in the tub and shower. Do not use towel bars as grab bars.  Use non-skid mats or decals in the tub or shower.  If you need to sit down in the shower, use a plastic, non-slip stool.  Keep the floor dry. Clean up any water that spills on the floor as soon as it happens.  Remove soap buildup in the tub or shower regularly.  Attach bath mats securely with double-sided  non-slip rug tape.  Do not have throw rugs and other things on the floor that can make you trip. What can I do in the bedroom?  Use night lights.  Make sure that you have a light by your bed that is easy to reach.  Do not use any sheets or blankets that are too big for your bed. They should not hang down onto the floor.  Have a firm chair that has side arms. You can use this for support while you get dressed.  Do not have throw rugs and other things on the floor that can make you trip. What can I do in the kitchen?  Clean up any spills right away.  Avoid walking on wet floors.  Keep items that you use a lot in easy-to-reach places.  If you need to reach something above you, use a strong step stool that has a grab bar.  Keep electrical cords out of the way.  Do not use floor polish or wax that makes floors slippery. If you must use wax, use non-skid floor wax.  Do not have throw rugs and other things on the floor that can make you trip. What can I do with my stairs?  Do not leave any items  on the stairs.  Make sure that there are handrails on both sides of the stairs and use them. Fix handrails that are broken or loose. Make sure that handrails are as long as the stairways.  Check any carpeting to make sure that it is firmly attached to the stairs. Fix any carpet that is loose or worn.  Avoid having throw rugs at the top or bottom of the stairs. If you do have throw rugs, attach them to the floor with carpet tape.  Make sure that you have a light switch at the top of the stairs and the bottom of the stairs. If you do not have them, ask someone to add them for you. What else can I do to help prevent falls?  Wear shoes that:  Do not have high heels.  Have rubber bottoms.  Are comfortable and fit you well.  Are closed at the toe. Do not wear sandals.  If you use a stepladder:  Make sure that it is fully opened. Do not climb a closed stepladder.  Make sure that both  sides of the stepladder are locked into place.  Ask someone to hold it for you, if possible.  Clearly mark and make sure that you can see:  Any grab bars or handrails.  First and last steps.  Where the edge of each step is.  Use tools that help you move around (mobility aids) if they are needed. These include:  Canes.  Walkers.  Scooters.  Crutches.  Turn on the lights when you go into a dark area. Replace any light bulbs as soon as they burn out.  Set up your furniture so you have a clear path. Avoid moving your furniture around.  If any of your floors are uneven, fix them.  If there are any pets around you, be aware of where they are.  Review your medicines with your doctor. Some medicines can make you feel dizzy. This can increase your chance of falling. Ask your doctor what other things that you can do to help prevent falls. This information is not intended to replace advice given to you by your health care provider. Make sure you discuss any questions you have with your health care provider. Document Released: 01/25/2009 Document Revised: 09/06/2015 Document Reviewed: 05/05/2014 Elsevier Interactive Patient Education  2017 [employer].

## 2020-07-13 NOTE — Assessment & Plan Note (Signed)
Stable. Continue to follow with neurology. Call with any concerns. Continue to monitor.

## 2020-07-13 NOTE — Progress Notes (Signed)
I connected with [name] today by telephone and verified that I am speaking with the correct person using two identifiers. Location patient: home Location provider: work Persons participating in the virtual visit: Ebubechukwu Jedlicka, Connery Shiffler (spouse), Glenna Durand LPN.   I discussed the limitations, risks, security and privacy concerns of performing an evaluation and management service by telephone and the availability of in person appointments. I also discussed with the patient that there may be a patient responsible charge related to this service. The patient expressed understanding and verbally consented to this telephonic visit.    Interactive audio and video telecommunications were attempted between this provider and patient, however failed, due to patient having technical difficulties OR patient did not have access to video capability.  We continued and completed visit with audio only.     Vital signs may be patient reported or missing.  Subjective:   Thomas [name] is a 70 y.o. male who presents for Medicare Annual/Subsequent preventive examination.  Review of Systems     Cardiac Risk Factors include: obesity (BMI >30kg/m2);sedentary lifestyle     Objective:    Today's Vitals   07/13/20 1256 07/13/20 1257  Weight: 230 lb (104.3 kg)   Height: 5' 9.5" (1.765 m)   PainSc:  5    Body mass index is 33.48 kg/m.  Advanced Directives 07/13/2020 06/11/2020 06/05/2020 12/13/2019 07/27/2019 07/01/2019 01/27/2019  Does Patient Have a Medical Advance Directive? Yes Yes Yes Yes Yes Yes Yes  Type of Paramedic of [place];Living will [place];Living will;Out of facility DNR (pink MOST or yellow form) [place];Living will - [place];Living will;Out of facility DNR (pink MOST or yellow form) [place];Living will Living will;Healthcare Power of Attorney  Does patient want to make changes  to medical advance directive? - - - - - - -  Copy of [place] in Chart? Yes - validated most recent copy scanned in chart (See row information) - - - No - copy requested - -  Would patient like information on creating a medical advance directive? - - - - - - -    Current Medications (verified) Outpatient Encounter Medications as of 07/13/2020  Medication Sig  . acetaminophen (TYLENOL) 325 MG tablet Take 325 mg by mouth every 6 (six) hours as needed.  Marland Kitchen amantadine (SYMMETREL) 100 MG capsule TAKE 1 CAPSULE THREE TIMES DAILY  . amLODipine (NORVASC) 2.5 MG tablet Take 0.5 tablets (1.25 mg total) by mouth daily as needed. Take on days where BP>160  . carbidopa-levodopa (SINEMET CR) 50-200 MG tablet TAKE 1 TABLET AT BEDTIME  . carbidopa-levodopa (SINEMET IR) 25-100 MG tablet TAKE 2 TABLETS AT 6AM, 10AM, AND 2PM, AND TAKE 1 TABLET AT 6PM AS DIRECTED. CAN TAKE EXTRA 1/2 TABLET-1 TABLET AS NEEDED.  Marland Kitchen cetirizine (ZYRTEC) 10 MG tablet Take 10 mg by mouth daily.  . cholecalciferol (VITAMIN D3) 25 MCG (1000 UNIT) tablet Take 1,000 Units by mouth daily.  . diclofenac Sodium (VOLTAREN) 1 % GEL APPLY TOPICALLY 4 GRAMS FOUR TIMES DAILY  . dutasteride (AVODART) 0.5 MG capsule Take 1 capsule (0.5 mg total) by mouth daily.  . hydrALAZINE (APRESOLINE) 25 MG tablet Take 1 tablet (25 mg total) by mouth 3 (three) times daily as needed (for SBP >160).  Marland Kitchen lisinopril (ZESTRIL) 30 MG tablet Take 1 tablet (30 mg total) by mouth daily.  . melatonin 5 MG TABS Take 1 capsule by mouth at bedtime as needed (  for sleep).   . metoprolol succinate (TOPROL-XL) 25 MG 24 hr tablet Take 1 tablet (25 mg total) by mouth daily.  . Multiple Vitamin (MULTIVITAMIN) tablet Take 1 tablet by mouth daily.  . polyethylene glycol powder (GLYCOLAX/MIRALAX) 17 GM/SCOOP powder Take 17 g by mouth 2 (two) times daily as needed.  Marland Kitchen PREVIDENT 5000 BOOSTER PLUS 1.1 % PSTE See admin instructions.  Marland Kitchen rOPINIRole (REQUIP) 2 MG tablet Take  1 tablet (2 mg total) by mouth 3 (three) times daily for 10 days.  . sildenafil (REVATIO) 20 MG tablet Take 1-5 tablets (20-100 mg total) by mouth daily as needed.  . simvastatin (ZOCOR) 40 MG tablet Take 1 tablet (40 mg total) by mouth daily.  . SYSTANE ULTRA 0.4-0.3 % SOLN SMARTSIG:1 Drop(s) In Eye(s) PRN  . tamsulosin (FLOMAX) 0.4 MG CAPS capsule Take 1 capsule (0.4 mg total) by mouth daily.  . traMADol (ULTRAM) 50 MG tablet Take 1 tablet (50 mg total) by mouth 2 (two) times daily as needed.  . traZODone (DESYREL) 50 MG tablet TAKE 1/2 TO 1 TABLET AT BEDTIME AS NEEDED FOR SLEEP  . XARELTO 20 MG TABS tablet TAKE 1 TABLET (20 MG TOTAL) BY MOUTH DAILY.   No facility-administered encounter medications on file as of 07/13/2020.    Allergies (verified) Codeine   History: Past Medical History:  Diagnosis Date  . AF (atrial fibrillation) ([place])   . Allergy   . Arthritis    Osteoarthritis  . Arthritis of knee   . Atrial fibrillation ([place])   . DVT (deep venous thrombosis) ([facility])    L leg  . Hyperchloremia   . Hypercholesterolemia   . Hypertension   . Nephrolithiasis   . Parkinson's disease ([place])   . Plantar wart   . Pulmonary embolism ([place])   . Sciatica   . Seasonal allergies   . TIA (transient ischemic attack)    Past Surgical History:  Procedure Laterality Date  . APPENDECTOMY    . CATARACT EXTRACTION Bilateral 10/2019 11/2019  . COLONOSCOPY WITH PROPOFOL N/A 07/27/2019   Procedure: COLONOSCOPY WITH PROPOFOL;  Surgeon: Lin Landsman, MD;  [facility];  Service: Endoscopy;  Laterality: N/A;  . KNEE ARTHROSCOPY WITH MENISCAL REPAIR Right 2009  . KNEE SURGERY Right   . T&A    . TONSILLECTOMY     Family History  Problem Relation Age of Onset  . Parkinson's disease Father   . Coronary artery disease Mother   . Heart attack Sister   . Deep vein thrombosis Sister    Social History   Socioeconomic History  . Marital status: Married    Spouse name: Thomas [name]  .  Number of children: 0  . Years of education: Not on file  . Highest education level: Some college, no degree  Occupational History  . Occupation: retired    Comment: machinest  Tobacco Use  . Smoking status: Never Smoker  . Smokeless tobacco: Never Used  Vaping Use  . Vaping Use: Never used  Substance and Sexual Activity  . Alcohol use: No    Alcohol/week: 0.0 standard drinks  . Drug use: No  . Sexual activity: Not on file  Other Topics Concern  . Not on file  Social History Narrative   ** Merged History Encounter **    right handed   Two story home    Live with spouse   Social Determinants of Health   Financial Resource Strain: Low Risk   . Difficulty of Paying Living Expenses:  Not hard at all  Food Insecurity: No Food Insecurity  . Worried About Charity fundraiser in the Last Year: Never true  . Ran Out of Food in the Last Year: Never true  Transportation Needs: No Transportation Needs  . Lack of Transportation (Medical): No  . Lack of Transportation (Non-Medical): No  Physical Activity: Inactive  . Days of Exercise per Week: 0 days  . Minutes of Exercise per Session: 0 min  Stress: No Stress Concern Present  . Feeling of Stress : Not at all  Social Connections: Socially Integrated  . Frequency of Communication with Friends and Family: More than three times a week  . Frequency of Social Gatherings with Friends and Family: More than three times a week  . Attends Religious Services: More than 4 times per year  . Active Member of Clubs or Organizations: Yes  . Attends Archivist Meetings: More than 4 times per year  . Marital Status: Married    Tobacco Counseling Counseling given: Not Answered   Clinical Intake:  Pre-visit preparation completed: Yes  Pain : 0-10 Pain Score: 5  Pain Type: Chronic pain Pain Location: Back Pain Orientation: Lower Pain Descriptors / Indicators: Shooting Pain Onset: More than a month ago Pain Frequency: Constant      Nutritional Status: BMI > 30  Obese Nutritional Risks: None Diabetes: No  How often do you need to have someone help you when you read instructions, pamphlets, or other written materials from your doctor or pharmacy?: 1 - Never What is the last grade level you completed in school?: 19yrs college  Diabetic? no  Interpreter Needed?: No  Information entered by :: NAllen LPN   Activities of Daily Living In your present state of health, do you have any difficulty performing the following activities: 07/13/2020 07/10/2020  Hearing? N N  Vision? N N  Difficulty concentrating or making decisions? Y Y  Comment parkinson's -  Walking or climbing stairs? N Y  Dressing or bathing? N N  Doing errands, shopping? N N  Preparing Food and eating ? N -  Using the Toilet? N -  In the past six months, have you accidently leaked urine? Y -  Do you have problems with loss of bowel control? N -  Managing your Medications? N -  Managing your Finances? N -  Housekeeping or managing your Housekeeping? N -  Some recent data might be hidden    Patient Care Team: [name], DO as PCP - General (Family Medicine) Tat, Eustace Quail, DO as Consulting Physician (Neurology) Odette Fraction (Optometry) Rockey Situ Kathlene November, MD as Consulting Physician (Cardiology) Samara Deist, DPM as Referring Physician (Podiatry) Vanita Ingles, RN as Case Manager (General Practice)  Indicate any recent Medical Services you may have received from other than [facility] providers in the past year (date may be approximate).     Assessment:   This is a routine wellness examination for Thomas [name].  Hearing/Vision screen No exam data present  Dietary issues and exercise activities discussed: Current Exercise Habits: The patient does not participate in regular exercise at present  Goals    .  DIET - INCREASE WATER INTAKE      Recommend drinking at least 6-8 glasses of water a day     .  Patient Stated      07/13/2020,  wants to lose 5 pounds    .  PharmD "I want to make sure his medications are safe" (pt-stated)  Current Barriers:  . Polypharmacy; complex patient with multiple comorbidities including Parkinson's disease, atrial fibrillation (hx DVT, PE), HTN, BPH, insomnia . Wife assists in managing medications o PD: follows w/ Dr. Carles Collet. Carbidopa/levodopa 25/100, 2 tab at 6 am, 10 am, 2 pm, 6 pm; carbidopa/levodopa 50/200 QPM; amantadine 100 mg TID, ropinirole 2 mg TID. Had been prescribed SL apomorphine, however, d/t improvement w/ amantadine and cost of SL apomorphine, declined this medication. Notes he received the hospital bed rail and it has been immensely helpful  o Afib, HTN: Xarelto 20 mg QPM; metoprolol succinate 25 mg daily, lisinopril 40 mg daily (10 mg + 30 mg tab), amlodipine 2.5 mg PRN (has not needed) o ASCVD risk reduction: simvastatin 40 mg daily; LDL well controlled <70 o BPH: tamsulosin 0.4 mg daily; sildenafil 20 mg PRN o Insomnia: Trazodone 25 mg QPM + melatonin 2.5 mg QPM; notes this combination has been effective   Pharmacist Clinical Goal(s):  Marland Kitchen Over the next 90 days, patient will work with PharmD and provider towards optimized medication management  Interventions: . Comprehensive medication review performed; medication list updated in electronic medical record . Patient's wife asked if there are any drug interactions or dangers of renal/hepatic impairment from his regimen. Reviewed interactions. Discussed that multiple medications can cause CNS sedation, and to be cognizant for increased sedation with aging or dose increases. Discussed that PD medications and BP medications increase risk of hypotension; continue to monitor for s/sx orthostatic hypotension . If patient starts to need amlodipine regularly, recommend reducing simvasatin to 20 mg daily or changing to alternative statin, d/t DDI w/ amlodipine and simvastatin that increases simvastatin concentrations/risk for  myalgias . Patient and wife deny and cost concerns at this time.   Patient Self Care Activities:  . Patient will take medications as prescribed  Initial goal documentation     .  RN- I want to stay independent (pt-stated)      Current Barriers:  . Chronic Disease Management support and education needs related to Parkinson's Disease  Nurse Case Manager Clinical Goal(s):  Marland Kitchen Over the next 120 days, patient will work with [facility]  to address needs related to remaining independent   Interventions:  . Discussed plans with patient for ongoing care management follow up and provided patient with direct contact information for care management team . Evaluated the use of the bed rail.  The patient states this is very helpful for him and he is glad he has this to use- completed  . Assessed new safety concerns with the patient.  The patient uses a "hemi-walker" in the am for about 30 minutes before attempting to ambulate without assistance.  The patient feels safe in his home environment. The patient is having some difficulty with getting up and down when sitting and moves to the edge of the chair. The patient is doing well with this process. Inquired about a lift chair and the patient does not want to get a lift chair until he can no longer independently get up and down out of the chair. Has worked with PT. The patient is doing exercises that help with muscle strength. The patient had a fall with injury 2 to 3 weeks ago. The patient had an abrasion area. Carpet burn to his shoulder. The patients wife states it got infected and puss was coming out of it. The patient went to urgent care on Friday and has been taking an antibiotic. Has 3 more days of antibiotic left to take. Education on taking  all of antibiotic. The patient was coming down the steps and missed the last step. The patients wife is monitoring the area and says it is scabbed over and healing now. 10-18-2019: the area has healed completely and the  patient has not had any new falls.  . Assessed activity level and the patient is going to "[employer] and "[employer]". The patient states he feels much better once he has had a work out. The patient praised for positive healthy habits. The patient enjoys boxing at least 3 times a week and socializing with his friends by going out for coffee. The patient is still going to do boxing.  The patient states he knows there is a change in his level of endurance. The patient states he is stuttering more and also that he is "spent out" if he tries to walk over 30 minutes. 10-18-2019: the patient still is keeping his routine. The patients sister made a surprise visit from [place] and was with the patient and his wife from 10-12-2019 to 10/16/2019.  This made him "go down hill" a little bit but he is doing better since back to his full routine. The patient is still driving and going to work out. They had a trip to [place] which was good for both the patient and his wife.  . The patient has consultation appointment on 10-27-2019 with eye doctor. Will have right eye cataract removal on 11-08-2019. Will have his other eye done on 12-06-2019. The wife and the patient are happy that he will not have to go off of his medications to have the surgery.  . Evaluation of upcoming appointments: Sees Dr. [name] on 12-27-2019  Patient Self Care Activities:  . Attends all scheduled provider appointments . Performs ADL's independently . Patient wanting to stay independent as long as possible  Please see past updates related to this goal by clicking on the "Past Updates" button in the selected goal      .  RNCM: I am taking my blood pressure 2 times a day.  I have a new pill I am taking for my blood pressure      Current Barriers:  . Chronic Disease Management support and education needs related to HTN and HLD   Nurse Case Manager Clinical Goal(s):  Marland Kitchen Over the next 120 days, patient will verbalize understanding of plan for HTN and  HLD . Over the next 120 days, patient will attend all scheduled medical appointments: next appointment scheduled colonoscopy on 07-26-2019- completed  . Over the next 120 days, patient will demonstrate improved adherence to prescribed treatment plan for HTN and HLD as evidenced by continuing to monitor blood pressure BID, and adherence to a low sodium, heart healthy diet . Over the next 120 days, patient will demonstrate improved health management independence as evidenced by continuing to independently maintain health and well being with the assistance of his spouse when needed . Over the next 90 days, patient will work with CM team pharmacist to effectively manage medications and monitor for any issues such as orthostatic hypotension related to the patient taking amlodipine 2.5mg    Interventions:  . Evaluation of current treatment plan related to HTN and HLD and patient's adherence to plan as established by provider. . Provided education to patient re: orthostatic hypotension, changing position slowly and safety concerns in the patient with multiple chronic disease processes . Discussed plans with patient for ongoing care management follow up and provided patient with direct contact information for care management team .  Advised patient, providing education and rationale, to monitor blood pressure daily and record, calling pcp for findings outside established parameters.  . Reviewed scheduled/upcoming provider appointments including: CCM team and scheduled colonoscopy on 07/26/2019. Patient states that there was an abnormal "colon guard" result so he is following up with a colonoscopy. Per the patients wife this did not go well and the patient will not have another colonoscopy.  There were some polyps removed but the patient had to do the prep x 2 and it really took a log out of him. They have decided no more colonoscopies.  . The patient verbalized that he is still having issues with blood pressure  fluctuations. This is no new issue and is known by pcp.  He denies dizziness or light headiness when his pressure drops in the 67'T or 24'P Systolic. He is keeping a good record of his blood pressure readings and has parameters to follow.  His wife verbalized she has salty snacks for him to eat when it drops really low. The patient verbalized he thinks this is due to the progression of his parkinson's. 10-18-2019: the patient feels overall he is doing well with his health. He has noticed that the medications seems to be wearing out sooner. The patient will talk to Dr. Theda Sers concerning this.  . Pharmacy referral for medication management and follow up for any new concerns related to addition of amlodipine to the current treatment regimen. Ongoing support and help with medication management  . Assessed patients activity level. He is going to [employer] and [employer] and says he feels better once he has had a work out session.   Patient Self Care Activities:  . Patient verbalizes understanding of plan to manage HTN and HLD . Attends all scheduled provider appointments . Calls provider office for new concerns or questions . Unable to independently manage chronic conditions of HTN and HLD  Please see past updates related to this goal by clicking on the "Past Updates" button in the selected goal      .  RNCM: pt's wife: "I need information on a support to help with Greg's head tilting." (pt-stated)      CARE PLAN ENTRY (see longitudinal plan of care for additional care plan information)  Current Barriers:  Marland Kitchen Knowledge Deficits related to head supports for patient with Parkinson's with new onset of head tilt to the right.  . Care Coordination needs related to support devices  in a patient with Parkinson's  (disease states) . Chronic Disease Management support and education needs related to Parkinson's disease and progression and symptom management   Nurse Case Manager Clinical Goal(s):  Marland Kitchen Over the next 120  days, patient will work with [facility], CCM team and pcp  to address needs related to support devices to use in the patient with head tilt to the right related to progression of Parkinson's  . Over the next 120 days, patient will demonstrate a decrease in lack of head control exacerbations as evidenced by having support devices that will assist with health and wellness  Interventions:  . Inter-disciplinary care team collaboration (see longitudinal plan of care) . Evaluation of current treatment plan related to Parkinson's Disease and patient's adherence to plan as established by provider. . Advised patient to look for email correspondence related to supports to help with neck. Done  . Provided education to patient re: current available information on neck supports. The patients wife verbalized the support needs to be flat in the back. A  travel pillow does not work well for the patient.  The wife is wanting help the patient find something that will support his neck and shoulders. Education and support gathered and sent to the patients wife by email at [email or web address].  Also sent information by the [facility] system. Will continue to research and send related information to the patient and patients wife. Per the patients wife, an elderly friend from church has taken the travel pillow and shaved some of the stuffing from the back and this is helping the patient much better. They are concerned that some of the devices used to support the head are bulky and make the patient look like he is an invalid. The patient wants to remain as independent as possible.  [facility] with RNCM colleagues and pcp regarding support devices for head tilt in patient with Parkinson's.  The pcp recommended the patient and wife reaching out to the neurologist for specific help with recommendations for neck support. 10-18-2019: the patient is doing well with his care and the support device he is using is beneficial at this time.   . Discussed plans with patient for ongoing care management follow up and provided patient with direct contact information for care management team . Provided patient with Parkinson's  educational materials related to activities, supports to use in home and other resources for better understanding of Parkinson's and the disease progression.   Patient Self Care Activities:  . Patient verbalizes understanding of plan to receive information by email on support devices for head tilt in patient with Parkinson's  . Calls provider office for new concerns or questions . Unable to independently control tilting of head to the right in a patient with progression of Parkinson's   Please see past updates related to this goal by clicking on the "Past Updates" button in the selected goal        Depression Screen PHQ 2/9 Scores 07/13/2020 07/10/2020 06/28/2019 06/06/2019 03/30/2019 12/21/2018 06/03/2018  PHQ - 2 Score 1 1 0 1 1 0 0  PHQ- 9 Score - - 0 - 4 4 -    Fall Risk Fall Risk  07/13/2020 07/10/2020 06/11/2020 06/05/2020 12/13/2019  Falls in the past year? 0 0 0 1 1  Number falls in past yr: - 0 0 0 1  Injury with Fall? - 0 0 0 1  Risk for fall due to : Medication side effect No Fall Risks - - -  Follow up Falls evaluation completed;Education provided;Falls prevention discussed Falls evaluation completed - - -    FALL RISK PREVENTION PERTAINING TO THE HOME:  Any stairs in or around the home? Yes  If so, are there any without handrails? No  Home free of loose throw rugs in walkways, pet beds, electrical cords, etc? Yes  Adequate lighting in your home to reduce risk of falls? Yes   ASSISTIVE DEVICES UTILIZED TO PREVENT FALLS:  Life alert? No  Use of a cane, walker or w/c? Yes  Grab bars in the bathroom? Yes  Shower chair or bench in shower? Yes  Elevated toilet seat or a handicapped toilet? Yes   TIMED UP AND GO:  Was the test performed? No .     Cognitive Function: MMSE - Mini Mental State Exam  04/23/2018 07/27/2017 03/27/2017  Not completed: Unable to complete Unable to complete Unable to complete     6CIT Screen 07/13/2020 06/03/2018 05/01/2017 03/28/2016  What Year? 0 points 0 points 0 points 0 points  What month? 0 points  0 points 0 points 0 points  What time? 0 points 0 points 0 points 0 points  Count back from 20 2 points 0 points 0 points 0 points  Months in reverse 0 points 0 points 0 points 0 points  Repeat phrase 2 points 0 points 0 points 2 points  Total Score 4 0 0 2    Immunizations Immunization History  Administered Date(s) Administered  . Fluad Quad(high Dose 65+) 01/10/2019, 12/27/2019  . Influenza, High Dose Seasonal PF 02/13/2017, 12/16/2017  . Influenza,inj,Quad PF,6+ Mos 02/05/2015  . Influenza-Unspecified 02/05/2015, 01/27/2016  . PFIZER(Purple Top)SARS-COV-2 Vaccination 06/06/2019, 06/27/2019  . Pneumococcal Conjugate-13 06/18/2015  . Pneumococcal Polysaccharide-23 05/01/2017  . Tdap 02/16/2014  . Zoster 12/16/2014    TDAP status: Up to date  Flu Vaccine status: Up to date  Pneumococcal vaccine status: Up to date  Covid-19 vaccine status: Completed vaccines  Qualifies for Shingles Vaccine? Yes   Zostavax completed Yes   Shingrix Completed?: No.    Education has been provided regarding the importance of this vaccine. Patient has been advised to call insurance company to determine out of pocket expense if they have not yet received this vaccine. Advised may also receive vaccine at local pharmacy or Health Dept. Verbalized acceptance and understanding.  Screening Tests Health Maintenance  Topic Date Due  . COVID-19 Vaccine (3 - Booster for Pfizer series) 07/26/2020 (Originally 12/28/2019)  . INFLUENZA VACCINE  11/12/2020  . COLONOSCOPY (Pts 45-23yrs Insurance coverage will need to be confirmed)  07/27/2022  . TETANUS/TDAP  02/17/2024  . Hepatitis C Screening  Completed  . PNA vac Low Risk Adult  Completed  . HPV VACCINES  Aged Out    Health  Maintenance  There are no preventive care reminders to display for this patient.  Colorectal cancer screening: Type of screening: Colonoscopy. Completed 04/28/2019. Repeat every 3 years  Lung Cancer Screening: (Low Dose CT Chest recommended if Age 41-80 years, 30 pack-year currently smoking OR have quit w/in 15years.) does not qualify.   Lung Cancer Screening Referral: no  Additional Screening:  Hepatitis C Screening: does qualify; Completed 08/23/2015  Vision Screening: Recommended annual ophthalmology exams for early detection of glaucoma and other disorders of the eye. Is the patient up to date with their annual eye exam?  Yes  Who is the provider or what is the name of the office in which the patient attends annual eye exams? Dr. Ellin Mayhew If pt is not established with a provider, would they like to be referred to a provider to establish care? No .   Dental Screening: Recommended annual dental exams for proper oral hygiene  Community Resource Referral / Chronic Care Management: CRR required this visit?  No   CCM required this visit?  No      Plan:     I have personally reviewed and noted the following in the patient's chart:   . Medical and social history . Use of alcohol, tobacco or illicit drugs  . Current medications and supplements . Functional ability and status . Nutritional status . Physical activity . Advanced directives . List of other physicians . Hospitalizations, surgeries, and ER visits in previous 12 months . Vitals . Screenings to include cognitive, depression, and falls . Referrals and appointments  In addition, I have reviewed and discussed with patient certain preventive protocols, quality metrics, and best practice recommendations. A written personalized care plan for preventive services as well as general preventive health recommendations were provided to patient.  Kellie Simmering, LPN   0/12/3110   Nurse Notes:

## 2020-07-15 ENCOUNTER — Encounter: Payer: Self-pay | Admitting: Family Medicine

## 2020-07-17 DIAGNOSIS — H43813 Vitreous degeneration, bilateral: Secondary | ICD-10-CM | POA: Diagnosis not present

## 2020-07-17 DIAGNOSIS — H26493 Other secondary cataract, bilateral: Secondary | ICD-10-CM | POA: Diagnosis not present

## 2020-07-31 DIAGNOSIS — M5416 Radiculopathy, lumbar region: Secondary | ICD-10-CM | POA: Diagnosis not present

## 2020-07-31 DIAGNOSIS — M5126 Other intervertebral disc displacement, lumbar region: Secondary | ICD-10-CM | POA: Diagnosis not present

## 2020-08-24 ENCOUNTER — Other Ambulatory Visit: Payer: Self-pay | Admitting: Neurology

## 2020-08-24 DIAGNOSIS — G20 Parkinson's disease: Secondary | ICD-10-CM

## 2020-09-06 ENCOUNTER — Telehealth: Payer: Self-pay

## 2020-09-06 DIAGNOSIS — M5126 Other intervertebral disc displacement, lumbar region: Secondary | ICD-10-CM | POA: Diagnosis not present

## 2020-09-06 DIAGNOSIS — I10 Essential (primary) hypertension: Secondary | ICD-10-CM | POA: Diagnosis not present

## 2020-09-06 DIAGNOSIS — M4317 Spondylolisthesis, lumbosacral region: Secondary | ICD-10-CM | POA: Diagnosis not present

## 2020-09-06 DIAGNOSIS — M5416 Radiculopathy, lumbar region: Secondary | ICD-10-CM | POA: Diagnosis not present

## 2020-09-06 DIAGNOSIS — G20 Parkinson's disease: Secondary | ICD-10-CM | POA: Diagnosis not present

## 2020-09-06 DIAGNOSIS — Z6833 Body mass index (BMI) 33.0-33.9, adult: Secondary | ICD-10-CM | POA: Diagnosis not present

## 2020-09-06 NOTE — Telephone Encounter (Signed)
Copied from [place] [phone]. Topic: General - Other >> Sep 06, 2020  3:43 PM Bayard Beaver wrote: Reason for CRM:  patients wife called in for Terin Rothert, wanting to know if surgical clearance was received from dr stern's office.please advise >> Sep 06, 2020  3:54 PM Bayard Beaver wrote: Patients wife called in for Raekwon bruch, to see if clearance was received for surgery from dr Melven Sartorius office{[facility] [phone] . Please advise

## 2020-09-07 NOTE — Telephone Encounter (Signed)
Spoke with patient wife and informed her that we have not received patient surgical clearance from Dr.Stern's office. Patient wife states she was told they were going to get over within the next day or so.

## 2020-09-11 NOTE — Telephone Encounter (Signed)
Pt called and stated that she gave the wrong number for Dr Melven Sartorius office. Correct number is [phone]. Patient would like to know if someone will call today for surgical clearance. Please advise

## 2020-09-13 ENCOUNTER — Telehealth: Payer: Self-pay | Admitting: Neurology

## 2020-09-13 ENCOUNTER — Other Ambulatory Visit: Payer: Self-pay | Admitting: Neurology

## 2020-09-13 NOTE — Telephone Encounter (Signed)
Patient's wife called in stating they spoke with Dr. Melven Sartorius assistant and there is no guarantee that the patient will be worse or better after surgery. They are concerned and do not want him to get worse. She wants to get Dr. Arturo Morton advice

## 2020-09-13 NOTE — Telephone Encounter (Signed)
There can never be guarantees and i'm not sure that anyone can tell them that.  Its a decision that they will need to make for themselves.  I just know how independent he was before all of this back pain came on and he has tried therapy and injections without relief.  Short of surgery, i'm not sure what else to do to impact his quality of life.  I will need to rely, however, on the conversation that he had with Dr. Vertell Limber since he is the expert in the surgery.

## 2020-09-14 ENCOUNTER — Other Ambulatory Visit: Payer: Self-pay | Admitting: Family Medicine

## 2020-09-14 NOTE — Telephone Encounter (Signed)
Will need an appointment for surgical clearance with or without the paper

## 2020-09-14 NOTE — Telephone Encounter (Signed)
[facility] office and there office was closed and unable to leave a message to follow up with surgical clearance.

## 2020-09-14 NOTE — Telephone Encounter (Signed)
No at 9:54 09/14/2020

## 2020-09-14 NOTE — Telephone Encounter (Signed)
Enough given until appt 10/30/2020 with Dr.Rebecca Tat

## 2020-09-14 NOTE — Telephone Encounter (Signed)
Patient was advised. Thanked for calling.

## 2020-09-17 NOTE — Telephone Encounter (Signed)
Pt is scheduled for 09/18/2020

## 2020-09-18 ENCOUNTER — Ambulatory Visit (INDEPENDENT_AMBULATORY_CARE_PROVIDER_SITE_OTHER): Payer: Medicare HMO | Admitting: Family Medicine

## 2020-09-18 ENCOUNTER — Encounter: Payer: Self-pay | Admitting: Family Medicine

## 2020-09-18 ENCOUNTER — Other Ambulatory Visit: Payer: Self-pay

## 2020-09-18 VITALS — BP 124/72 | HR 60 | Temp 98.0°F | Ht 70.0 in | Wt 226.0 lb

## 2020-09-18 DIAGNOSIS — I82502 Chronic embolism and thrombosis of unspecified deep veins of left lower extremity: Secondary | ICD-10-CM

## 2020-09-18 DIAGNOSIS — R32 Unspecified urinary incontinence: Secondary | ICD-10-CM

## 2020-09-18 DIAGNOSIS — I2782 Chronic pulmonary embolism: Secondary | ICD-10-CM | POA: Diagnosis not present

## 2020-09-18 DIAGNOSIS — Z01818 Encounter for other preprocedural examination: Secondary | ICD-10-CM | POA: Diagnosis not present

## 2020-09-18 DIAGNOSIS — D6851 Activated protein C resistance: Secondary | ICD-10-CM | POA: Diagnosis not present

## 2020-09-18 DIAGNOSIS — R112 Nausea with vomiting, unspecified: Secondary | ICD-10-CM | POA: Diagnosis not present

## 2020-09-18 DIAGNOSIS — Z9889 Other specified postprocedural states: Secondary | ICD-10-CM

## 2020-09-18 LAB — URINALYSIS, ROUTINE W REFLEX MICROSCOPIC
Bilirubin, UA: NEGATIVE
Glucose, UA: NEGATIVE
Leukocytes,UA: NEGATIVE
Nitrite, UA: NEGATIVE
Protein,UA: NEGATIVE
RBC, UA: NEGATIVE
Specific Gravity, UA: >1.03 — ABNORMAL HIGH (ref 1.005–1.030)
Urobilinogen, Ur: 1 mg/dL (ref 0.2–1.0)
pH, UA: 5.5 (ref 5.0–7.5)

## 2020-09-18 LAB — BAYER DCA HB A1C WAIVED: HB A1C (BAYER DCA - WAIVED): 5.4 % (ref <7.0)

## 2020-09-19 LAB — CBC WITH DIFFERENTIAL/PLATELET
Basophils Absolute: 0.1 10*3/uL (ref 0.0–0.2)
Basos: 1 %
EOS (ABSOLUTE): 0.2 10*3/uL (ref 0.0–0.4)
Eos: 4 %
Hematocrit: 39.9 % (ref 37.5–51.0)
Hemoglobin: 13.6 g/dL (ref 13.0–17.7)
Immature Grans (Abs): 0 10*3/uL (ref 0.0–0.1)
Immature Granulocytes: 0 %
Lymphocytes Absolute: 1.1 10*3/uL (ref 0.7–3.1)
Lymphs: 23 %
MCH: 31.1 pg (ref 26.6–33.0)
MCHC: 34.1 g/dL (ref 31.5–35.7)
MCV: 91 fL (ref 79–97)
Monocytes Absolute: 0.5 10*3/uL (ref 0.1–0.9)
Monocytes: 10 %
Neutrophils Absolute: 2.8 10*3/uL (ref 1.4–7.0)
Neutrophils: 62 %
Platelets: 165 10*3/uL (ref 150–450)
RBC: 4.37 x10E6/uL (ref 4.14–5.80)
RDW: 11.7 % (ref 11.6–15.4)
WBC: 4.6 10*3/uL (ref 3.4–10.8)

## 2020-09-19 LAB — COMPREHENSIVE METABOLIC PANEL
ALT: 6 IU/L (ref 0–44)
AST: 12 IU/L (ref 0–40)
Albumin/Globulin Ratio: 1.7 (ref 1.2–2.2)
Albumin: 4.2 g/dL (ref 3.7–4.7)
Alkaline Phosphatase: 95 IU/L (ref 44–121)
BUN/Creatinine Ratio: 19 (ref 10–24)
BUN: 20 mg/dL (ref 8–27)
Bilirubin Total: 0.6 mg/dL (ref 0.0–1.2)
CO2: 24 mmol/L (ref 20–29)
Calcium: 9.9 mg/dL (ref 8.6–10.2)
Chloride: 102 mmol/L (ref 96–106)
Creatinine, Ser: 1.07 mg/dL (ref 0.76–1.27)
Globulin, Total: 2.5 g/dL (ref 1.5–4.5)
Glucose: 100 mg/dL — ABNORMAL HIGH (ref 65–99)
Potassium: 4 mmol/L (ref 3.5–5.2)
Sodium: 140 mmol/L (ref 134–144)
Total Protein: 6.7 g/dL (ref 6.0–8.5)
eGFR: 74 mL/min/{1.73_m2} (ref >59)

## 2020-09-19 LAB — PROTIME-INR
INR: 1.2 (ref 0.9–1.2)
Prothrombin Time: 12.4 s — ABNORMAL HIGH (ref 9.1–12.0)

## 2020-09-20 ENCOUNTER — Encounter: Payer: Self-pay | Admitting: Family Medicine

## 2020-09-20 DIAGNOSIS — M79674 Pain in right toe(s): Secondary | ICD-10-CM | POA: Diagnosis not present

## 2020-09-20 DIAGNOSIS — R112 Nausea with vomiting, unspecified: Secondary | ICD-10-CM

## 2020-09-20 DIAGNOSIS — Z9889 Other specified postprocedural states: Secondary | ICD-10-CM

## 2020-09-20 DIAGNOSIS — B351 Tinea unguium: Secondary | ICD-10-CM | POA: Diagnosis not present

## 2020-09-20 DIAGNOSIS — G20 Parkinson's disease: Secondary | ICD-10-CM | POA: Diagnosis not present

## 2020-09-20 DIAGNOSIS — M79675 Pain in left toe(s): Secondary | ICD-10-CM | POA: Diagnosis not present

## 2020-09-20 HISTORY — DX: Other specified postprocedural states: R11.2

## 2020-09-20 HISTORY — DX: Other specified postprocedural states: Z98.890

## 2020-09-20 NOTE — Assessment & Plan Note (Signed)
Given his history of PE and DVT, do not recommend him coming completely off anticoagulation for surgery. We will convert him to lovenox for surgery. He will let us know when it is scheduled for and we will get him switched over.

## 2020-09-20 NOTE — Progress Notes (Signed)
BP 124/72   Pulse 60   Temp 98 F (36.7 C)   Ht '5\' 10"'  (1.778 m)   Wt 226 lb (102.5 kg)   SpO2 98%   BMI 32.43 kg/m    Subjective:    Patient ID: Thomas [name], male    DOB: 1951/02/27, 70 y.o.   MRN: [id number]  HPI: Thomas [name] is a 70 y.o. male  Chief Complaint  Patient presents with   surgery clearance   Harce presents today for pre-op evaluation for spinal surgery. The surgery has not been scheduled yet as he needs to have clearance first. He notes that he has some significant back and leg pain today. He has had some backsliding with his parkinson's due to his inability to exercise. This has been really effecting his mood and he has not been doing well. He is hoping to have his surgery ASAP. He notes that he has had surgery in the past. His last surgery was in 2009 when he had his knee surgery. He has always done well with surgeries in the past. He does note that he has had nausea after anesthesia with both his colonoscopy and knee surgery. He has never had any problems with extubation. He also always went home when he was supposed to after surgeries. He has no family history of issues with anesthesia. He has no family history of malignant hyperthermia. He is having lumbar surgery- just shaving the disc off. He is currently feeling well except for his back. He denies any chest pain or SOB. He is able to walk up 2 flights without SOB even with his back. No other concerns or complaints at this time.   Active Ambulatory Problems    Diagnosis Date Noted   Hypercholesterolemia    Hypertension    DVT (deep venous thrombosis) (HCC)    Parkinson's disease (HCC)    Pulmonary embolism (HCC)    Erectile dysfunction 03/01/2015   Atrial fibrillation ([place]) 08/23/2015   BPH (benign prostatic hyperplasia) 09/10/2018   Insomnia due to medical condition 09/30/2018   Activated protein C resistance ([place]) 06/28/2019   Chronic bilateral low back pain 05/24/2020   Post-operative nausea and  vomiting 09/20/2020   Resolved Ambulatory Problems    Diagnosis Date Noted   No Resolved Ambulatory Problems   Past Medical History:  Diagnosis Date   AF (atrial fibrillation) (HCC)    Allergy    Arthritis    Arthritis of knee    Hyperchloremia    Nephrolithiasis    Plantar wart    Sciatica    Seasonal allergies    TIA (transient ischemic attack)    Past Surgical History:  Procedure Laterality Date   APPENDECTOMY     CATARACT EXTRACTION Bilateral 10/2019 11/2019   COLONOSCOPY WITH PROPOFOL N/A 07/27/2019   Procedure: COLONOSCOPY WITH PROPOFOL;  Surgeon: Lin Landsman, MD;  Location: [facility];  Service: Endoscopy;  Laterality: N/A;   KNEE ARTHROSCOPY WITH MENISCAL REPAIR Right 2009   KNEE SURGERY Right    T&A     TONSILLECTOMY     Outpatient Encounter Medications as of 09/18/2020  Medication Sig   acetaminophen (TYLENOL) 325 MG tablet Take 325 mg by mouth every 6 (six) hours as needed.   amantadine (SYMMETREL) 100 MG capsule TAKE 1 CAPSULE THREE TIMES DAILY   amLODipine (NORVASC) 2.5 MG tablet Take 0.5 tablets (1.25 mg total) by mouth daily as needed. Take on days where BP>160   carbidopa-levodopa (SINEMET CR) 50-200 MG tablet TAKE  1 TABLET AT BEDTIME   carbidopa-levodopa (SINEMET IR) 25-100 MG tablet TAKE 2 TABLETS AT 6AM, 10AM, AND 2PM, AND TAKE 1 TABLET AT 6PM AS DIRECTED. CAN TAKE EXTRA 1/2 TABLET-1 TABLET AS NEEDED.   cetirizine (ZYRTEC) 10 MG tablet Take 10 mg by mouth daily.   cholecalciferol (VITAMIN D3) 25 MCG (1000 UNIT) tablet Take 1,000 Units by mouth daily.   diclofenac Sodium (VOLTAREN) 1 % GEL APPLY TOPICALLY 4 GRAMS FOUR TIMES DAILY   dutasteride (AVODART) 0.5 MG capsule Take 1 capsule (0.5 mg total) by mouth daily.   hydrALAZINE (APRESOLINE) 25 MG tablet Take 1 tablet (25 mg total) by mouth 3 (three) times daily as needed (for SBP >160).   lisinopril (ZESTRIL) 30 MG tablet Take 1 tablet (30 mg total) by mouth daily.   melatonin 5 MG TABS Take 1  capsule by mouth at bedtime as needed (for sleep).    metoprolol succinate (TOPROL-XL) 25 MG 24 hr tablet TAKE 1 TABLET (25 MG TOTAL) BY MOUTH DAILY.   Multiple Vitamin (MULTIVITAMIN) tablet Take 1 tablet by mouth daily.   polyethylene glycol powder (GLYCOLAX/MIRALAX) 17 GM/SCOOP powder Take 17 g by mouth 2 (two) times daily as needed.   PREVIDENT 5000 BOOSTER PLUS 1.1 % PSTE See admin instructions.   rOPINIRole (REQUIP) 2 MG tablet TAKE 1 TABLET THREE TIMES DAILY   sildenafil (REVATIO) 20 MG tablet Take 1-5 tablets (20-100 mg total) by mouth daily as needed.   simvastatin (ZOCOR) 40 MG tablet Take 1 tablet (40 mg total) by mouth daily.   SYSTANE ULTRA 0.4-0.3 % SOLN SMARTSIG:1 Drop(s) In Eye(s) PRN   tamsulosin (FLOMAX) 0.4 MG CAPS capsule Take 1 capsule (0.4 mg total) by mouth daily.   traZODone (DESYREL) 50 MG tablet TAKE 1/2 TO 1 TABLET AT BEDTIME AS NEEDED FOR SLEEP   XARELTO 20 MG TABS tablet TAKE 1 TABLET (20 MG TOTAL) BY MOUTH DAILY.   No facility-administered encounter medications on file as of 09/18/2020.   Allergies  Allergen Reactions   Codeine Other (See Comments)    Reaction: Hallucinations   Social History   Socioeconomic History   Marital status: Married    Spouse name: [name]   Number of children: 0   Years of education: Not on file   Highest education level: Some college, no degree  Occupational History   Occupation: retired    Comment: Youth worker  Tobacco Use   Smoking status: Never   Smokeless tobacco: Never  Vaping Use   Vaping Use: Never used  Substance and Sexual Activity   Alcohol use: No    Alcohol/week: 0.0 standard drinks   Drug use: No   Sexual activity: Not Currently  Other Topics Concern   Not on file  Social History Narrative   ** Merged History Encounter **    right handed   Two story home    Live with spouse   Social Determinants of Health   Financial Resource Strain: Low Risk    Difficulty of Paying Living Expenses: Not hard at all   Food Insecurity: No Food Insecurity   Worried About Charity fundraiser in the Last Year: Never true   Arboriculturist in the Last Year: Never true  Transportation Needs: No Transportation Needs   Lack of Transportation (Medical): No   Lack of Transportation (Non-Medical): No  Physical Activity: Inactive   Days of Exercise per Week: 0 days   Minutes of Exercise per Session: 0 min  Stress: No Stress  Concern Present   Feeling of Stress : Not at all  Social Connections: Not on file   Family History  Problem Relation Age of Onset   Parkinson's disease Father    Coronary artery disease Mother    Heart attack Sister    Deep vein thrombosis Sister    Review of Systems  Constitutional: Negative.   Respiratory: Negative.    Cardiovascular: Negative.   Gastrointestinal: Negative.   Musculoskeletal:  Positive for back pain, gait problem and myalgias. Negative for arthralgias, joint swelling, neck pain and neck stiffness.  Psychiatric/Behavioral: Negative.     Per HPI unless specifically indicated above     Objective:    BP 124/72   Pulse 60   Temp 98 F (36.7 C)   Ht '5\' 10"'  (1.778 m)   Wt 226 lb (102.5 kg)   SpO2 98%   BMI 32.43 kg/m   Wt Readings from Last 3 Encounters:  09/18/20 226 lb (102.5 kg)  07/13/20 230 lb (104.3 kg)  07/10/20 230 lb (104.3 kg)    Physical Exam Vitals and nursing note reviewed.  Constitutional:      General: He is not in acute distress.    Appearance: Normal appearance. He is not ill-appearing, toxic-appearing or diaphoretic.  HENT:     Head: Normocephalic and atraumatic.     Right Ear: External ear normal.     Left Ear: External ear normal.     Nose: Nose normal.     Mouth/Throat:     Mouth: Mucous membranes are moist.     Pharynx: Oropharynx is clear.  Eyes:     General: No scleral icterus.       Right eye: No discharge.        Left eye: No discharge.     Extraocular Movements: Extraocular movements intact.     Conjunctiva/sclera:  Conjunctivae normal.     Pupils: Pupils are equal, round, and reactive to light.  Cardiovascular:     Rate and Rhythm: Normal rate and regular rhythm.     Pulses: Normal pulses.     Heart sounds: Normal heart sounds. No murmur heard.   No friction rub. No gallop.  Pulmonary:     Effort: Pulmonary effort is normal. No respiratory distress.     Breath sounds: Normal breath sounds. No stridor. No wheezing, rhonchi or rales.  Chest:     Chest wall: No tenderness.  Musculoskeletal:        General: Normal range of motion.     Cervical back: Normal range of motion and neck supple.  Skin:    General: Skin is warm and dry.     Capillary Refill: Capillary refill takes less than 2 seconds.     Coloration: Skin is not jaundiced or pale.     Findings: No bruising, erythema, lesion or rash.  Neurological:     General: No focal deficit present.     Mental Status: He is alert and oriented to person, place, and time. Mental status is at baseline.  Psychiatric:        Mood and Affect: Mood normal.        Behavior: Behavior normal.        Thought Content: Thought content normal.        Judgment: Judgment normal.    Results for orders placed or performed in visit on 09/18/20  Comprehensive metabolic panel  Result Value Ref Range   Glucose 100 (H) 65 - 99 mg/dL   BUN 20  8 - 27 mg/dL   Creatinine, Ser 1.07 0.76 - 1.27 mg/dL   eGFR 74 >59 mL/min/1.73   BUN/Creatinine Ratio 19 10 - 24   Sodium 140 134 - 144 mmol/L   Potassium 4.0 3.5 - 5.2 mmol/L   Chloride 102 96 - 106 mmol/L   CO2 24 20 - 29 mmol/L   Calcium 9.9 8.6 - 10.2 mg/dL   Total Protein 6.7 6.0 - 8.5 g/dL   Albumin 4.2 3.7 - 4.7 g/dL   Globulin, Total 2.5 1.5 - 4.5 g/dL   Albumin/Globulin Ratio 1.7 1.2 - 2.2   Bilirubin Total 0.6 0.0 - 1.2 mg/dL   Alkaline Phosphatase 95 44 - 121 IU/L   AST 12 0 - 40 IU/L   ALT 6 0 - 44 IU/L  CBC with Differential/Platelet  Result Value Ref Range   WBC 4.6 3.4 - 10.8 x10E3/uL   RBC 4.37 4.14 -  5.80 x10E6/uL   Hemoglobin 13.6 13.0 - 17.7 g/dL   Hematocrit 39.9 37.5 - 51.0 %   MCV 91 79 - 97 fL   MCH 31.1 26.6 - 33.0 pg   MCHC 34.1 31.5 - 35.7 g/dL   RDW 11.7 11.6 - 15.4 %   Platelets 165 150 - 450 x10E3/uL   Neutrophils 62 Not Estab. %   Lymphs 23 Not Estab. %   Monocytes 10 Not Estab. %   Eos 4 Not Estab. %   Basos 1 Not Estab. %   Neutrophils Absolute 2.8 1.4 - 7.0 x10E3/uL   Lymphocytes Absolute 1.1 0.7 - 3.1 x10E3/uL   Monocytes Absolute 0.5 0.1 - 0.9 x10E3/uL   EOS (ABSOLUTE) 0.2 0.0 - 0.4 x10E3/uL   Basophils Absolute 0.1 0.0 - 0.2 x10E3/uL   Immature Granulocytes 0 Not Estab. %   Immature Grans (Abs) 0.0 0.0 - 0.1 x10E3/uL  Bayer DCA Hb A1c Waived  Result Value Ref Range   HB A1C (BAYER DCA - WAIVED) 5.4 <7.0 %  INR/PT  Result Value Ref Range   INR 1.2 0.9 - 1.2   Prothrombin Time 12.4 (H) 9.1 - 12.0 sec  Urinalysis, Routine w reflex microscopic  Result Value Ref Range   Specific Gravity, UA >1.030 (H) 1.005 - 1.030   pH, UA 5.5 5.0 - 7.5   Color, UA Yellow Yellow   Appearance Ur Clear Clear   Leukocytes,UA Negative Negative   Protein,UA Negative Negative/Trace   Glucose, UA Negative Negative   Ketones, UA 1+ (A) Negative   RBC, UA Negative Negative   Bilirubin, UA Negative Negative   Urobilinogen, Ur 1.0 0.2 - 1.0 mg/dL   Nitrite, UA Negative Negative      Assessment & Plan:   Problem List Items Addressed This Visit       Cardiovascular and Mediastinum   DVT (deep venous thrombosis) (HCC)    Given his history of PE and DVT, do not recommend him coming completely off anticoagulation for surgery. We will convert him to lovenox for surgery. He will let us know when it is scheduled for and we will get him switched over.        Pulmonary embolism (HCC)    Given his history of PE and DVT, do not recommend him coming completely off anticoagulation for surgery. We will convert him to lovenox for surgery. He will let us know when it is scheduled for and  we will get him switched over.          Digestive   Post-operative nausea and  vomiting    Encouraged patient to make sure to let the anesthesiologist know prior to surgery.         Hematopoietic and Hemostatic   Activated protein C resistance ([place])    Given his history of PE and DVT, do not recommend him coming completely off anticoagulation for surgery. We will convert him to lovenox for surgery. He will let us know when it is scheduled for and we will get him switched over.        Other Visit Diagnoses     Preop exam for internal medicine    -  Primary   EKG normal. Will check labs. Assuming they are normal, will clear for surgery. Await results.    Relevant Orders   Comprehensive metabolic panel (Completed)   CBC with Differential/Platelet (Completed)   Bayer DCA Hb A1c Waived (Completed)   INR/PT (Completed)   EKG 12-Lead (Completed)   Urinary incontinence, unspecified type       Will check urine today. Await results. Treat as needed.    Relevant Orders   Urinalysis, Routine w reflex microscopic (Completed)        Follow up plan: Return if symptoms worsen or fail to improve.   >30 minutes spent with patient and his wife today.

## 2020-09-20 NOTE — Assessment & Plan Note (Signed)
Encouraged patient to make sure to let the anesthesiologist know prior to surgery.

## 2020-09-20 NOTE — Progress Notes (Signed)
Normal labs. Will clear for surgery. Please send copy of note to his surgeon. Thanks!

## 2020-09-20 NOTE — Progress Notes (Signed)
Interpreted by me on 09/18/20. NSR at 56bpm. No ST segment changes

## 2020-09-21 ENCOUNTER — Telehealth: Payer: Self-pay

## 2020-09-21 NOTE — Telephone Encounter (Signed)
Returned call, patient wife is aware of results. Patient wife wanted to make sure office note has been sent. Assured notes and labs have been faxed to [facility].

## 2020-09-21 NOTE — Telephone Encounter (Signed)
Copied from [facility] [place] [phone]. Topic: General - Other >> Sep 21, 2020  2:42 PM [name], [place] C wrote: Reason for CRM: pt and spouse called in for lab results.   Please assist.

## 2020-09-24 ENCOUNTER — Telehealth: Payer: Self-pay

## 2020-09-24 ENCOUNTER — Other Ambulatory Visit: Payer: Self-pay | Admitting: Neurosurgery

## 2020-09-24 NOTE — Telephone Encounter (Signed)
Pts wife is calling back extremely concerned and is requesting Dr. [name] to send in the the medication to be sent to the pharmacy to that the pt can have his surgery on 09/28/20. Please advise

## 2020-09-24 NOTE — Telephone Encounter (Signed)
Pts wife (DPR) stated needed  lovamax RX needs to be sent to pharmacy pt wife stated she was told she can pick up and after she needs to be shown how to adminisiter shot.to PT unsure how or what he needs for the shot other than they were told that he needs it 3 days prior pt stated he cant get surgery on Friday with out the shots per last note Dr [name] stated to convert him to lovenox for surgery. He will let us know when it is scheduled for and we will get him switched over.He  is scheduled for 06/17/202.

## 2020-09-25 NOTE — Telephone Encounter (Signed)
Scheduled 6/16

## 2020-09-25 NOTE — Telephone Encounter (Signed)
Patient's wife called again to speak with the nurse regarding her previous messages.  Read the messages to wife but she needs more clarification.  Please call to speak with wife.

## 2020-09-25 NOTE — Telephone Encounter (Signed)
Called and left a message for [facility] with [facility]. Will wait for a return call.

## 2020-09-25 NOTE — Telephone Encounter (Signed)
Patients wife is concerned and would like more clarification about the blood thinner, please call her to discuss this.   [phone]

## 2020-09-25 NOTE — Telephone Encounter (Signed)
Received a call back from Neurosurgery regarding this medication. I spoke with the doctors nurse. Xarelto would have had to have been stopped today and it is to late to start Lovenox since it would have to be started tonight in [place] for surgery on Friday. So the safest thing to do for the patient at this time is to postpone surgery for the time being and wait until the proper bridging can be done for the procedure per Neurosurgery.

## 2020-09-25 NOTE — Telephone Encounter (Signed)
According to chart review, surgery has been rescheduled for 10/11/20.

## 2020-09-25 NOTE — Telephone Encounter (Signed)
Called and left another message for a call back.

## 2020-09-25 NOTE — Telephone Encounter (Signed)
When I spoke with Neurosurgery earlier, they said that they would be in touch with [place] regarding the new surgery date so we could properly bridge the patient.

## 2020-09-27 ENCOUNTER — Other Ambulatory Visit: Payer: Self-pay

## 2020-09-27 ENCOUNTER — Ambulatory Visit (INDEPENDENT_AMBULATORY_CARE_PROVIDER_SITE_OTHER): Payer: Medicare HMO | Admitting: Family Medicine

## 2020-09-27 ENCOUNTER — Inpatient Hospital Stay (HOSPITAL_COMMUNITY)
Admission: RE | Admit: 2020-09-27 | Discharge: 2020-09-27 | Disposition: A | Discharge status: Home / Self Care | Payer: Medicare HMO | Source: Ambulatory Visit

## 2020-09-27 ENCOUNTER — Encounter: Payer: Self-pay | Admitting: Family Medicine

## 2020-09-27 VITALS — BP 138/78 | HR 57 | Temp 98.0°F | Wt 228.8 lb

## 2020-09-27 DIAGNOSIS — D6851 Activated protein C resistance: Secondary | ICD-10-CM

## 2020-09-27 MED ORDER — ENOXAPARIN SODIUM 40 MG/0.4ML IJ SOSY: 40.0000 mg | PREFILLED_SYRINGE | INTRAMUSCULAR | 0 refills | Status: DC

## 2020-09-27 MED ORDER — PEN NEEDLES 30G X 5 MM MISC: 1.0000 | Freq: Every day | 0 refills | Status: DC

## 2020-09-27 NOTE — Progress Notes (Signed)
Discussed how to do bridging lovenox. Education provided.

## 2020-09-27 NOTE — Progress Notes (Signed)
Thomas [name] did not come for his 1:00 PAT appointment. I called pt's cell and spoke with his wife,Thomas [name], who said patient was not available. Thomas [name] said that they thought pt's PAT appointment had been cancelled since pt's surgery was changed from June 17 to June 30.  She also said that pt was getting ready to leave for a doctor's appointment and they would like his PAT appointment rescheduled. I told her the appointment would be rescheduled and they would be called with the new time. I spoke with scheduler, Thomas [name], who said she would call the patient to reschedule his PAT appointment.

## 2020-09-27 NOTE — Patient Instructions (Signed)
Take your last xarelto 10/03/20 Do you first lovenox injection 10/04/20 1 shot daily until 10/09/20 No shots 6/29-7/1 Start your shot again 7/2-7/9 Restart your xarelto 7/10

## 2020-09-27 NOTE — Progress Notes (Signed)
Surgical Instructions    Your procedure is scheduled on 10/11/20.  Report to [facility] Main Entrance "A" at 1:00pm, then check in with the Admitting office.  Call this number if you have problems the morning of surgery:  [phone]   If you have any questions prior to your surgery date call [phone]: Open Monday-Friday 8am-4pm    Remember:  Do not eat or drink after midnight the night before your surgery      Take these medicines the morning of surgery with A SIP OF WATER :             acetaminophen (TYLENOL)             amantadine (SYMMETREL)              amLODipine (NORVASC)             carbidopa-levodopa (SINEMET CR) 25-100             cetirizine (ZYRTEC)             rOPINIRole (REQUIP)              SYSTANE ULTRA    As of today, STOP taking any Aspirin (unless otherwise instructed by your surgeon) Aleve, Naproxen, Ibuprofen, Motrin, Advil, Goody's, BC's, all herbal medications, fish oil, and all vitamins.         acetaminophen (TYLENOL) Do not wear jewelry or makeup Do not wear lotions, powders, perfumes/colognes, or deodorant. Do not shave 48 hours prior to surgery.  Men may shave face and neck. Do not bring valuables to the hospital. DO Not wear nail polish, gel polish, artificial nails, or any other type of covering on  natural nails including finger and toenails. If patients have artificial nails, gel coating, etc. that need to be removed by a nail salon please have this removed prior to surgery or surgery may need to be canceled/delayed if the surgeon/ anesthesia feels like the patient is unable to be adequately monitored.             [facility] is not responsible for any belongings or valuables.  Do NOT Smoke (Tobacco/Vaping) or drink Alcohol 24 hours prior to your procedure If you use a CPAP at night, you may bring all equipment for your overnight stay.   Contacts, glasses, dentures or bridgework may not be worn into surgery, please bring cases for these  belongings   For patients admitted to the hospital, discharge time will be determined by your treatment team.   Patients discharged the day of surgery will not be allowed to drive home, and someone needs to stay with them for 24 hours.  ONLY 1 SUPPORT PERSON MAY BE PRESENT WHILE YOU ARE IN SURGERY. IF YOU ARE TO BE ADMITTED ONCE YOU ARE IN YOUR ROOM YOU WILL BE ALLOWED TWO (2) VISITORS.  Minor children may have two parents present. Special consideration for safety and communication needs will be reviewed on a case by case basis.  Special instructions:    Oral Hygiene is also important to reduce your risk of infection.  Remember - BRUSH YOUR TEETH THE MORNING OF SURGERY WITH YOUR REGULAR TOOTHPASTE   [facility]- Preparing For Surgery  Before surgery, you can play an important role. Because skin is not sterile, your skin needs to be as free of germs as possible. You can reduce the number of germs on your skin by washing with CHG (chlorahexidine gluconate) Soap before surgery.  CHG is an antiseptic cleaner which  kills germs and bonds with the skin to continue killing germs even after washing.     Please do not use if you have an allergy to CHG or antibacterial soaps. If your skin becomes reddened/irritated stop using the CHG.  Do not shave (including legs and underarms) for at least 48 hours prior to first CHG shower. It is OK to shave your face.  Please follow these instructions carefully.     Shower the NIGHT BEFORE SURGERY and the MORNING OF SURGERY with CHG Soap.   If you chose to wash your hair, wash your hair first as usual with your normal shampoo. After you shampoo, rinse your hair and body thoroughly to remove the shampoo.  Then [employer] and genitals (private parts) with your normal soap and rinse thoroughly to remove soap.  After that Use CHG Soap as you would any other liquid soap. You can apply CHG directly to the skin and wash gently with a scrungie or a clean washcloth.    Apply the CHG Soap to your body ONLY FROM THE NECK DOWN.  Do not use on open wounds or open sores. Avoid contact with your eyes, ears, mouth and genitals (private parts). Wash Face and genitals (private parts)  with your normal soap.   Wash thoroughly, paying special attention to the area where your surgery will be performed.  Thoroughly rinse your body with warm water from the neck down.  DO NOT shower/wash with your normal soap after using and rinsing off the CHG Soap.  Pat yourself dry with a CLEAN TOWEL.  Wear CLEAN PAJAMAS to bed the night before surgery  Place CLEAN SHEETS on your bed the night before your surgery  DO NOT SLEEP WITH PETS.   Day of Surgery:  Take a shower with CHG soap. Wear Clean/Comfortable clothing the morning of surgery Do not apply any deodorants/lotions.   Remember to brush your teeth WITH YOUR REGULAR TOOTHPASTE.   Please read over the following fact sheets that you were given.

## 2020-10-08 ENCOUNTER — Encounter (HOSPITAL_COMMUNITY)
Admission: RE | Admit: 2020-10-08 | Discharge: 2020-10-08 | Disposition: A | Discharge status: Home / Self Care | Payer: Medicare HMO | Source: Ambulatory Visit | Attending: Neurosurgery | Admitting: Neurosurgery

## 2020-10-08 ENCOUNTER — Telehealth: Payer: Self-pay | Admitting: Family Medicine

## 2020-10-08 ENCOUNTER — Other Ambulatory Visit: Payer: Self-pay

## 2020-10-08 ENCOUNTER — Encounter (HOSPITAL_COMMUNITY): Payer: Self-pay

## 2020-10-08 DIAGNOSIS — G20 Parkinson's disease: Secondary | ICD-10-CM | POA: Insufficient documentation

## 2020-10-08 DIAGNOSIS — I4891 Unspecified atrial fibrillation: Secondary | ICD-10-CM | POA: Diagnosis not present

## 2020-10-08 DIAGNOSIS — M5126 Other intervertebral disc displacement, lumbar region: Secondary | ICD-10-CM | POA: Diagnosis not present

## 2020-10-08 DIAGNOSIS — Z86711 Personal history of pulmonary embolism: Secondary | ICD-10-CM | POA: Diagnosis not present

## 2020-10-08 DIAGNOSIS — Z01812 Encounter for preprocedural laboratory examination: Secondary | ICD-10-CM | POA: Insufficient documentation

## 2020-10-08 DIAGNOSIS — Z8673 Personal history of transient ischemic attack (TIA), and cerebral infarction without residual deficits: Secondary | ICD-10-CM | POA: Insufficient documentation

## 2020-10-08 DIAGNOSIS — Z7901 Long term (current) use of anticoagulants: Secondary | ICD-10-CM | POA: Insufficient documentation

## 2020-10-08 DIAGNOSIS — Z20822 Contact with and (suspected) exposure to covid-19: Secondary | ICD-10-CM | POA: Insufficient documentation

## 2020-10-08 DIAGNOSIS — Z79899 Other long term (current) drug therapy: Secondary | ICD-10-CM | POA: Diagnosis not present

## 2020-10-08 DIAGNOSIS — Z86718 Personal history of other venous thrombosis and embolism: Secondary | ICD-10-CM | POA: Diagnosis not present

## 2020-10-08 HISTORY — DX: Cerebral infarction, unspecified: I63.9

## 2020-10-08 HISTORY — DX: Personal history of urinary calculi: Z87.442

## 2020-10-08 HISTORY — DX: Cardiac arrhythmia, unspecified: I49.9

## 2020-10-08 HISTORY — DX: Other specified postprocedural states: Z98.890

## 2020-10-08 HISTORY — DX: Other specified postprocedural states: R11.2

## 2020-10-08 HISTORY — DX: Pneumonia, unspecified organism: J18.9

## 2020-10-08 LAB — SURGICAL PCR SCREEN
MRSA, PCR: NEGATIVE
Staphylococcus aureus: POSITIVE — AB

## 2020-10-08 LAB — SARS CORONAVIRUS 2 (TAT 6-24 HRS): SARS Coronavirus 2: NEGATIVE

## 2020-10-08 NOTE — Telephone Encounter (Signed)
Pt would like to speak to clinical staff in regards to directions

## 2020-10-08 NOTE — Progress Notes (Signed)
PCP - Park Liter, DO Cardiologist - Ida Rogue, MD  PPM/ICD - denies Device Orders - denies Rep Notified - denies  Chest x-ray - 09/18/2020 EKG - 09/18/2020 Stress Test - 07/16/2015 ECHO - 07/19/2015 Cardiac Cath - denies  Sleep Study - denies CPAP - N/A  Fasting Blood Sugar - N/A  Blood Thinner Instructions: per patient and his wife, last day of Xarelto was 10/03/20 and on 10/04/2020 patient started Lovenox.   Aspirin Instructions: Patient was instructed: As of today, STOP taking any Aspirin (unless otherwise instructed by your surgeon) Aleve, Naproxen, Ibuprofen, Motrin, Advil, Goody's, BC's, all herbal medications, fish oil, and all vitamins.  ERAS Protcol - no PRE-SURGERY Ensure or G2- N/A  COVID TEST- 10/08/2020   Anesthesia review: yes; cardiac history; BP elevated in PAT 158/101. Patient verbalized that he didn't have any BP medication this morning. After PAT appointment, BP was 135/92.  Patient denies shortness of breath, fever, cough and chest pain at PAT appointment   All instructions explained to the patient, with a verbal understanding of the material. Patient agrees to go over the instructions while at home for a better understanding. Patient also instructed to self quarantine after being tested for COVID-19. The opportunity to ask questions was provided.

## 2020-10-08 NOTE — H&P (Signed)
Patient ID:   [id number] Patient: Thomas [name]  Date of Birth: 15-Apr-1950 Visit Type: Office Visit   Date: 09/06/2020 11:15 AM Provider: Marchia Meiers. Vertell Limber MD   This 70 year old male presents for back pain.  HISTORY OF PRESENT ILLNESS:  1.  back pain  Patient returns as scheduled.  He did go ahead with an injection in [facility] April 17th and found it helpful.  He says some benefits continue, but he does experience pain with standing or walking more than 15 minutes.  He reports pain radiates from the top of both hips to the middle of his back and causes him to stoop.  While the stooped posture helps his low back pain, it complicates his Parkinson's gait and balance.  Tramadol 50 mg taken q.h.s. and occasionally during the day.  The patient is continuing to complain of significant pain and is limited in terms of his ability to function.  He describes his pain at 6 to 7/10 and says it is preventing him from walking and being active.  I reviewed his imaging with him and his wife and he has a disc herniation at L3-4 and L4-5 on the left.  He says that he is not getting sufficient relief and that he is losing his ability to exercise and take care of his Parkinson's and he would like to go ahead with surgery to try to get this situation to turn around.  The patient had a deep venous thrombosis in 2012 and pulmonary embolism and has been on blood thinners ever since without additional problems related to hypercoagulability  I have recommended he get medical clearance prior to proceeding with surgery.  We discussed at length the procedure and its entailed risks and benefits and both he and his wife wish for him to proceed.         Medical/Surgical/Interim History Reviewed, no change.  Last detailed document date:07/09/2020.     PAST MEDICAL HISTORY, SURGICAL HISTORY, FAMILY HISTORY, SOCIAL HISTORY AND REVIEW OF SYSTEMS I have reviewed the patient's past medical, surgical, family and  social history as well as the comprehensive review of systems as included on the [facility] history form dated 07/09/2020, which I have signed.  Family History:  Reviewed, no changes.  Last detailed document date:07/09/2020.   Social History: Reviewed, no changes. Last detailed document date: 07/09/2020.    MEDICATIONS: (added, continued or stopped this visit) Started Medication Directions Instruction Stopped   acetaminophen 325 mg tablet take 1 tablet by oral route  every 6 hours as needed     amantadine HCl 100 mg capsule take 1 capsule by oral route 3 times every day     amlodipine 2.5 mg tablet take 1/2 tablet by oral route  every day     carbidopa ER 50 mg-levodopa 200 mg tablet,extended release      cetirizine 10 mg tablet take 1 tablet by oral route  every day     cholecalciferol (vitamin D3) 25 mcg (1,000 unit) capsule take 1 capsule by oral route every day     diclofenac 1 % topical gel apply (4G)  by topical route 4 times every day to the affected area(s)     hydralazine 25 mg tablet take 1 tablet by oral route 3 times every day with food     lisinopril 30 mg tablet take 1 tablet by oral route  every day     melatonin take 1 capsule by oral route every day at bedtime  as needed     metoprolol succinate ER 25 mg tablet,extended release 24 hr take 1 tablet by oral route  every day     multivitamin take 1 tablet by oral route  every day     polyethylene glycol 3350 17 gram oral powder packet take 1 packet by oral route 2 times every day mixed with 8 oz. water, juice, soda, coffee or tea     PreviDent 5000 Booster Plus 1.1 % dental paste      ropinirole 2 mg tablet take 1 tablet by oral route 3 times every day     sildenafil (pulmonary hypertension) 20 mg tablet take 1-5 tablets by oral route  every day as needed       ALLERGIES: Ingredient Reaction Medication Name Comment  CODEINE Hallucinations     Reviewed, no changes.    PHYSICAL EXAM:    Vitals Date Temp F BP Pulse Ht In Wt Lb BMI BSA Pain Score  09/06/2020  135/79 60 70 230 33  7/10      IMPRESSION:   Herniated lumbar discs L3-4 and L4-5 on the left with left-sided nerve root compression.  Concomitantly the patient has Parkinson's disease and a history of hypercoagulability and blood clot with pulmonary embolism  Comments:  Cardiac clearance from is needed prior to surgery.  Xarelto will need to stop prior to surgery  PLAN:  Medical clearance for surgery and then proceed with left L3-4 and left L4-5 microdiskectomy.  The patient will be admitted overnight at the hospital.  This will be done at [facility].  Orders: Instruction(s)/Education: Assessment Instruction  I10 Lifestyle education  [phone] Dietary management education, guidance, and counseling   Completed Orders (this encounter) Order Details Reason Side Interpretation Result Initial Treatment Date Region  Lifestyle education Patient will follow up with Primary Care Physician.        Dietary management education, guidance, and counseling Encouraged patient to eat well balanced diet.         Assessment/Plan   # Detail Type Description   1. Assessment Spondylolisthesis, lumbosacral region (M43.17).       2. Assessment Parkinson disease (G20).       3. Assessment Herniated nucleus pulposus, lumbar (M51.26).       4. Assessment Radiculopathy, lumbar region (M54.16).       5. Assessment Essential (primary) hypertension (I10).       6. Assessment Body mass index (BMI) 33.0-33.9, adult (K53.97).   Plan Orders Today's instructions / counseling include(s) Dietary management education, guidance, and counseling. Clinical information/comments: Encouraged patient to eat well balanced diet.         Pain Management Plan Pain Scale: 7/10. Method: Numeric Pain Intensity Scale. Location: back. Onset: 01/13/2020. Duration: varies. Quality: discomforting. Pain management follow-up plan of care: Patient  will continue medication management..              Provider:  Marchia Meiers. Vertell Limber MD  09/09/2020 04:17 PM    Dictation edited by: Marchia Meiers. Vertell Limber    CC Providers: Park Liter [address] [place],  [place]  [phone]   Wells [facility] [address]  [address] [place], [place] [address]               Electronically signed by Marchia Meiers Vertell Limber MD on 09/09/2020 04:17 PM

## 2020-10-08 NOTE — Telephone Encounter (Signed)
Called and clarified instructions with the patients wife.

## 2020-10-08 NOTE — H&P (Deleted)
Patient ID:   [id number] Patient: Thomas [name]  Date of Birth: 01/17/1955 Visit Type: Office Visit   Date: 09/06/2020 09:45 AM Provider: Marchia Meiers. Vertell Limber MD   This 70 year old male presents for back pain.  HISTORY OF PRESENT ILLNESS:  1.  back pain  Patient returns to discuss surgical options.  The patient is continuing to complain of severe pain and he grades it up to 10/10 in severity.  He notes pain in the right leg and not a lot of pain on the left.  His previous surgeries were on the the left.  He has a significant synovial cyst at L3-4 on the right which is causing right-sided nerve root compression as well as a significant disc herniation at the L5-S1 level on the right.  This is also causing significant disc herniation.  He has a pseudoarthrosis at the previously operated L5 through S1 levels.  I have recommended based on the severity of the patient's pain and its intractable nature thereof, to proceed with redo decompression with fusion L3-4, L4-5, L5-S1 levels from the right.  He will have a bone growth stimulator and be fitted for new lumbosacral arthrosis.         Medical/Surgical/Interim History Reviewed, no change.  Last detailed document date:05/11/2017.     PAST MEDICAL HISTORY, SURGICAL HISTORY, FAMILY HISTORY, SOCIAL HISTORY AND REVIEW OF SYSTEMS I have reviewed the patient's past medical, surgical, family and social history as well as the comprehensive review of systems as included on the [facility] history form dated 07/12/2020, which I have signed.  Family History:  Reviewed, no changes.  Last detailed document date:05/11/2017.   Social History: Reviewed, no changes. Last detailed document date: 05/11/2017.    MEDICATIONS: (added, continued or stopped this visit) Started Medication Directions Instruction Stopped   amoxicillin 500 mg tablet take 1 tablet by oral route  every 12 hours  09/06/2020  09/03/2018  cyclobenzaprine 10 mg tablet take 1 tablet by oral route 3 times every day as needed for muscle spasms  09/06/2020  12/07/2017 diclofenac sodium 75 mg tablet,delayed release take 1 tablet by oral route 2 times every day  09/06/2020   FISH OIL     08/20/2020 hydrocodone 5 mg-acetaminophen 325 mg tablet take 1 tablet by oral route  every 6 hours as needed for pain     losartan 100 mg tablet take 1 tablet by oral route  every day    05/27/2020 Medrol (Pak) 4 mg tablets in a dose pack Take as directed  09/06/2020  08/20/2020 methocarbamol 500 mg tablet take 1 tablet by oral route 3 times every day     prednisone 10 mg tablet take 1 tablet by oral route  every day  09/06/2020  06/25/2020 tramadol 50 mg tablet take 1 tablet by oral route  every 6 hours as needed  09/06/2020   Vitamin B-12 1,000 mcg tablet        ALLERGIES: Ingredient Reaction Medication Name Comment  NO KNOWN ALLERGIES     No known allergies. Reviewed, no changes.    PHYSICAL EXAM:   Vitals Date Temp F BP Pulse Ht In Wt Lb BMI BSA Pain Score  09/06/2020  160/94 98 71 230 32.08  10/10      IMPRESSION:   Severe right leg pain and lumbar radiculopathy.  Pseudoarthrosis of the lumbar spine particularly the L5-S1 level  PLAN:  Proceed with redo decompression L3-4 through L5-S1 levels with possible interbody grafting from the  right and redo pedicle fixation with posterolateral arthrodesis.  Patient wishes to proceed with surgery on an expedited basis.  Risks and benefits were discussed in detail with the patient he wishes to proceed.  Patient education was performed in detail today.  Orders: Office Procedures/Services: Assessment Service Comments  S32.009K External Bone Growth Stimulator [employer] [phone]    Diagnostic Procedures: Assessment Procedure  M54.16 Lumbar Spine- AP/Lat  Instruction(s)/Education: Assessment Instruction  I10 Lifestyle education  Z68.32 Dietary management education,  guidance, and counseling  Miscellaneous: Assessment   S32.009K LSO Brace   Completed Orders (this encounter) Order Details Reason Side Interpretation Result Initial Treatment Date Region  Lifestyle education Patient will follow up with Primary Care Physician.        Dietary management education, guidance, and counseling Encouraged patient to eat well balanced diet.         Assessment/Plan   # Detail Type Description   1. Assessment Disc degeneration, lumbar (M51.36).       2. Assessment Other secondary scoliosis, lumbar region (M41.56).       3. Assessment Lumbar foraminal stenosis (M48.061).       4. Assessment Synovial cyst of lumbar facet joint (M71.38).       5. Assessment Radiculopathy, lumbar region (M54.16).       6. Assessment Lumbar pseudoarthrosis (S32.009K).   Plan Orders LSO Brace. External Bone Growth Stimulator [employer] [phone].       7. Assessment Essential (primary) hypertension (I10).       8. Assessment Body mass index (BMI) 32.0-32.9, adult (O17.71).   Plan Orders Today's instructions / counseling include(s) Dietary management education, guidance, and counseling. Clinical information/comments: Encouraged patient to eat well balanced diet.         Pain Management Plan Pain Scale: 10/10. Method: Numeric Pain Intensity Scale. Location: back. Onset: 05/11/2014. Duration: varies. Quality: discomforting. Pain management follow-up plan of care: Patient will continue medication management..              Provider:  Marchia Meiers. Vertell Limber MD  09/09/2020 04:27 PM    Dictation edited by: Marchia Meiers. Vertell Limber    CC Providers: Cathlean Cower [facility] [place],  [place]  [phone]   Cathlean Cower  [facility] [address] [place], [place] [address]               Electronically signed by Marchia Meiers. Vertell Limber MD on 09/09/2020 04:27 PM

## 2020-10-08 NOTE — Telephone Encounter (Signed)
Patient wife calling back to speak to Destiny or anyone. Asking to be reached today say she need an answer before tomorrow Ph# [phone]

## 2020-10-08 NOTE — Telephone Encounter (Signed)
Caller in need of clarity when patient should d/c enoxaparin (LOVENOX) 40 MG/0.4ML injection prior to surgery and when patient should re start. Surgery date is Thursday 10/11/2020 and the directions are unclear when patient should d/c, please advise. If a you call after 10am today please call [phone] and if you call prior please call home # [phone]

## 2020-10-08 NOTE — Telephone Encounter (Signed)
Pt wife called a little upset. She states that she is driving and received a call regarding injections. She states that she can not talk and drive at the same time and would like a very detailed message left on her voicemail. Please advise.

## 2020-10-08 NOTE — Telephone Encounter (Signed)
Full instructions with dates on last AVS- please given them to patient.

## 2020-10-08 NOTE — Telephone Encounter (Signed)
Called both numbers lvm with message from PCP

## 2020-10-08 NOTE — Telephone Encounter (Signed)
Left message for patient  to give our office a call back at earliest convenience.

## 2020-10-09 NOTE — Anesthesia Preprocedure Evaluation (Addendum)
Anesthesia Evaluation  Patient identified by MRN, date of birth, ID band Patient awake    Reviewed: Allergy & Precautions, NPO status , Patient's Chart, lab work & pertinent test results  History of Anesthesia Complications (+) PONV  Airway Mallampati: II  TM Distance: >3 FB Neck ROM: Full    Dental no notable dental hx.    Pulmonary neg pulmonary ROS,    Pulmonary exam normal breath sounds clear to auscultation       Cardiovascular hypertension, Pt. on medications and Pt. on home beta blockers negative cardio ROS Normal cardiovascular exam Rhythm:Regular Rate:Normal     Neuro/Psych TIACVA negative psych ROS   GI/Hepatic negative GI ROS, Neg liver ROS,   Endo/Other  negative endocrine ROS  Renal/GU negative Renal ROS  negative genitourinary   Musculoskeletal  (+) Arthritis , Osteoarthritis,    Abdominal (+) + obese,   Peds negative pediatric ROS (+)  Hematology negative hematology ROS (+)   Anesthesia Other Findings   Reproductive/Obstetrics negative OB ROS                            Anesthesia Physical Anesthesia Plan  ASA: 3  Anesthesia Plan: General   Post-op Pain Management:    Induction: Intravenous  PONV Risk Score and Plan: 3 and Ondansetron, Dexamethasone, Midazolam and Treatment may vary due to age or medical condition  Airway Management Planned: Oral ETT  Additional Equipment:   Intra-op Plan:   Post-operative Plan: Extubation in OR  Informed Consent: I have reviewed the patients History and Physical, chart, labs and discussed the procedure including the risks, benefits and alternatives for the proposed anesthesia with the patient or authorized representative who has indicated his/her understanding and acceptance.     Dental advisory given  Plan Discussed with: CRNA  Anesthesia Plan Comments: (See APP note by Durel Salts, FNP )       Anesthesia Quick  Evaluation

## 2020-10-09 NOTE — Progress Notes (Signed)
Anesthesia Chart Review:   Case: [id number] Date/Time: 10/11/20 1430   Procedure: Left Lumbar 3-4, Lumbar 4-5 Microdiscectomy (Left)   Anesthesia type: General   Pre-op diagnosis: Herniated nucleus pulposus, Lumbar   Location: [facility] OR ROOM 20 / [place] [facility]   Surgeons: Erline Levine, MD       DISCUSSION: Pt is 70 year old with hx atrial fibrillation, HTN, DVT, PE, stroke, TIA, Parkinson's disease  Last dose xarelto 10/03/20, on lovenox bridge   VS: BP (!) 135/92   Pulse (!) 57   Temp 36.7 C (Oral)   Resp 18   Ht 5\' 10"  (1.778 m)   Wt 103.7 kg   SpO2 100%   BMI 32.80 kg/m   PROVIDERS: - PCP is [name], DO - Cardiologist is Ida Rogue, MD. Last office visit 02/07/20.   LABS:  - CBC w/diff 09/18/20  was normal - CMP 09/18/20 showed glucose 100, otherwise normal - HbA1c 09/18/20 was 5.4 - PT 09/18/20 was 12.4  (all labs ordered are listed, but only abnormal results are displayed)  Labs Reviewed  SURGICAL PCR SCREEN - Abnormal; Notable for the following components:      Result Value   Staphylococcus aureus POSITIVE (*)    All other components within normal limits  SARS CORONAVIRUS 2 (TAT 6-24 HRS)    EKG 09/18/20:  Sinus  Bradycardia  Voltage criteria for LVH  (R(I)+S(III) exceeds 2.50 mV)  -Voltage criteria w/o ST/T abnormality may be normal.    CV: Echo 07/19/15:  1.  Suboptimal study due to poor windows. 2.  Severely dilated LA and mildly dilated RA with ventricles and aorta appearing normal in size 3.  Normal LV systolic function.  Normal wall motion.  Mild LVH with grade 1 (relaxation abnormality) diastolic dysfunction. 4.  Trace to mild pulmonary regurgitation 5.  Mild tricuspid regurgitation 6.  Normal pulmonary artery pressure. 7.  Mild mitral regurgitation. 8.  Mild aortic regurgitation. 9.  No pericardial effusion 10.  No evidence of thrombi in LA or LV  Nuclear stress test 07/16/15:  -Normal stress test with normal LVEF, patient in sinus rhythm.   Past  Medical History:  Diagnosis Date   AF (atrial fibrillation) (HCC)    Allergy    Arthritis    Osteoarthritis   Arthritis of knee    Atrial fibrillation (HCC)    DVT (deep venous thrombosis) (HCC)    L leg   Dysrhythmia    History of kidney stones    Hyperchloremia    Hypercholesterolemia    Hypertension    Nephrolithiasis    Parkinson's disease (HCC)    Plantar wart    Pneumonia    PONV (postoperative nausea and vomiting)    Pulmonary embolism (HCC)    Sciatica    Seasonal allergies    Stroke [facility])    TIA (transient ischemic attack)     Past Surgical History:  Procedure Laterality Date   APPENDECTOMY     CATARACT EXTRACTION Bilateral 10/2019 11/2019   COLONOSCOPY WITH PROPOFOL N/A 07/27/2019   Procedure: COLONOSCOPY WITH PROPOFOL;  Surgeon: Lin Landsman, MD;  Location: [facility] ENDOSCOPY;  Service: Endoscopy;  Laterality: N/A;   EYE SURGERY     KNEE ARTHROSCOPY WITH MENISCAL REPAIR Right 2009   KNEE SURGERY Right    T&A     TONSILLECTOMY      MEDICATIONS:  acetaminophen (TYLENOL) 500 MG tablet   amantadine (SYMMETREL) 100 MG capsule   amLODipine (NORVASC) 2.5 MG tablet  calcium carbonate (TUMS - DOSED IN MG ELEMENTAL CALCIUM) 500 MG chewable tablet   carbidopa-levodopa (SINEMET CR) 50-200 MG tablet   carbidopa-levodopa (SINEMET IR) 25-100 MG tablet   cetirizine (ZYRTEC) 10 MG tablet   cholecalciferol (VITAMIN D3) 25 MCG (1000 UNIT) tablet   diclofenac Sodium (VOLTAREN) 1 % GEL   dutasteride (AVODART) 0.5 MG capsule   enoxaparin (LOVENOX) 40 MG/0.4ML injection   hydrALAZINE (APRESOLINE) 25 MG tablet   Insulin Pen Needle (PEN NEEDLES) 30G X 5 MM MISC   lisinopril (ZESTRIL) 30 MG tablet   melatonin 5 MG TABS   metoprolol succinate (TOPROL-XL) 25 MG 24 hr tablet   Multiple Vitamin (MULTIVITAMIN) tablet   polyethylene glycol powder (GLYCOLAX/MIRALAX) 17 GM/SCOOP powder   PREVIDENT 5000 BOOSTER PLUS 1.1 % PSTE   rOPINIRole (REQUIP) 2 MG tablet   simvastatin  (ZOCOR) 40 MG tablet   SYSTANE ULTRA 0.4-0.3 % SOLN   tamsulosin (FLOMAX) 0.4 MG CAPS capsule   traMADol (ULTRAM) 50 MG tablet   traZODone (DESYREL) 50 MG tablet   XARELTO 20 MG TABS tablet   No current facility-administered medications for this encounter.   - Last dose xarelto 10/03/20, on lovenox bridge   If no changes, I anticipate pt can proceed with surgery as scheduled.   Willeen Cass, PhD, FNP-BC [facility]/Anesthesiology Phone: [phone] 10/09/2020 4:00 PM

## 2020-10-11 ENCOUNTER — Encounter (HOSPITAL_COMMUNITY)
Admission: RE | Disposition: A | Discharge status: Home / Self Care | Payer: Self-pay | Source: Home / Self Care | Attending: Neurosurgery

## 2020-10-11 ENCOUNTER — Ambulatory Visit (HOSPITAL_COMMUNITY): Payer: Medicare HMO | Admitting: Emergency Medicine

## 2020-10-11 ENCOUNTER — Ambulatory Visit (HOSPITAL_COMMUNITY): Payer: Medicare HMO

## 2020-10-11 ENCOUNTER — Observation Stay (HOSPITAL_COMMUNITY)
Admission: RE | Admit: 2020-10-11 | Discharge: 2020-10-12 | Disposition: A | Discharge status: Home / Self Care | Payer: Medicare HMO | Attending: Neurosurgery | Admitting: Neurosurgery

## 2020-10-11 DIAGNOSIS — Z981 Arthrodesis status: Secondary | ICD-10-CM | POA: Diagnosis not present

## 2020-10-11 DIAGNOSIS — M5126 Other intervertebral disc displacement, lumbar region: Secondary | ICD-10-CM | POA: Diagnosis present

## 2020-10-11 DIAGNOSIS — M4156 Other secondary scoliosis, lumbar region: Secondary | ICD-10-CM | POA: Diagnosis not present

## 2020-10-11 DIAGNOSIS — E7800 Pure hypercholesterolemia, unspecified: Secondary | ICD-10-CM | POA: Diagnosis not present

## 2020-10-11 DIAGNOSIS — I10 Essential (primary) hypertension: Secondary | ICD-10-CM | POA: Diagnosis not present

## 2020-10-11 DIAGNOSIS — M5116 Intervertebral disc disorders with radiculopathy, lumbar region: Secondary | ICD-10-CM | POA: Diagnosis not present

## 2020-10-11 DIAGNOSIS — M7138 Other bursal cyst, other site: Secondary | ICD-10-CM | POA: Insufficient documentation

## 2020-10-11 DIAGNOSIS — X58XXXD Exposure to other specified factors, subsequent encounter: Secondary | ICD-10-CM | POA: Diagnosis not present

## 2020-10-11 DIAGNOSIS — S32009K Unspecified fracture of unspecified lumbar vertebra, subsequent encounter for fracture with nonunion: Secondary | ICD-10-CM | POA: Diagnosis not present

## 2020-10-11 DIAGNOSIS — M48061 Spinal stenosis, lumbar region without neurogenic claudication: Secondary | ICD-10-CM | POA: Insufficient documentation

## 2020-10-11 DIAGNOSIS — Z419 Encounter for procedure for purposes other than remedying health state, unspecified: Secondary | ICD-10-CM

## 2020-10-11 DIAGNOSIS — I4891 Unspecified atrial fibrillation: Secondary | ICD-10-CM | POA: Diagnosis not present

## 2020-10-11 HISTORY — PX: LUMBAR LAMINECTOMY/DECOMPRESSION MICRODISCECTOMY: SHX5026

## 2020-10-11 HISTORY — DX: Other intervertebral disc displacement, lumbar region: M51.26

## 2020-10-11 SURGERY — LUMBAR LAMINECTOMY/DECOMPRESSION MICRODISCECTOMY 2 LEVELS
Anesthesia: General | Site: Spine Lumbar | Laterality: Left

## 2020-10-11 MED ORDER — MENTHOL 3 MG MT LOZG: 1.0000 | LOZENGE | OROMUCOSAL | Status: DC | PRN

## 2020-10-11 MED ORDER — METHOCARBAMOL 500 MG PO TABS
ORAL_TABLET | ORAL | Status: AC
  Filled 2020-10-11: qty 1

## 2020-10-11 MED ORDER — TRAMADOL HCL 50 MG PO TABS
50.0000 mg | ORAL_TABLET | Freq: Four times a day (QID) | ORAL | Status: DC | PRN
  Administered 2020-10-11 – 2020-10-12 (×2): 100 mg via ORAL
  Filled 2020-10-11 (×2): qty 2

## 2020-10-11 MED ORDER — FENTANYL CITRATE (PF) 100 MCG/2ML IJ SOLN
INTRAMUSCULAR | Status: DC | PRN
  Administered 2020-10-11: 100 ug via INTRAVENOUS

## 2020-10-11 MED ORDER — PHENOL 1.4 % MT LIQD: 1.0000 | OROMUCOSAL | Status: DC | PRN

## 2020-10-11 MED ORDER — CARBIDOPA-LEVODOPA 25-100 MG PO TABS
2.0000 | ORAL_TABLET | Freq: Four times a day (QID) | ORAL | Status: DC
  Administered 2020-10-11 – 2020-10-12 (×3): 2 via ORAL
  Filled 2020-10-11 (×4): qty 2

## 2020-10-11 MED ORDER — LORATADINE 10 MG PO TABS
10.0000 mg | ORAL_TABLET | Freq: Every day | ORAL | Status: DC
  Administered 2020-10-11 – 2020-10-12 (×2): 10 mg via ORAL
  Filled 2020-10-11 (×2): qty 1

## 2020-10-11 MED ORDER — ROCURONIUM BROMIDE 10 MG/ML (PF) SYRINGE
PREFILLED_SYRINGE | INTRAVENOUS | Status: DC | PRN
  Administered 2020-10-11: 100 mg via INTRAVENOUS

## 2020-10-11 MED ORDER — POLYETHYL GLYCOL-PROPYL GLYCOL 0.4-0.3 % OP SOLN: 1.0000 [drp] | Freq: Every day | OPHTHALMIC | Status: DC | PRN

## 2020-10-11 MED ORDER — ACETAMINOPHEN 650 MG RE SUPP: 650.0000 mg | RECTAL | Status: DC | PRN

## 2020-10-11 MED ORDER — SIMVASTATIN 20 MG PO TABS
40.0000 mg | ORAL_TABLET | Freq: Every day | ORAL | Status: DC
  Administered 2020-10-11: 40 mg via ORAL
  Filled 2020-10-11: qty 2

## 2020-10-11 MED ORDER — ACETAMINOPHEN 500 MG PO TABS
1000.0000 mg | ORAL_TABLET | Freq: Every day | ORAL | Status: DC
  Administered 2020-10-11 – 2020-10-12 (×2): 1000 mg via ORAL
  Filled 2020-10-11 (×2): qty 2

## 2020-10-11 MED ORDER — HYDRALAZINE HCL 25 MG PO TABS
25.0000 mg | ORAL_TABLET | Freq: Every day | ORAL | Status: DC | PRN
  Filled 2020-10-11: qty 1

## 2020-10-11 MED ORDER — HYDROMORPHONE HCL 1 MG/ML IJ SOLN: 0.2500 mg | INTRAMUSCULAR | Status: DC | PRN

## 2020-10-11 MED ORDER — CARBIDOPA-LEVODOPA ER 50-200 MG PO TBCR
1.0000 | EXTENDED_RELEASE_TABLET | Freq: Every day | ORAL | Status: DC
  Administered 2020-10-11: 1 via ORAL
  Filled 2020-10-11 (×2): qty 1

## 2020-10-11 MED ORDER — 0.9 % SODIUM CHLORIDE (POUR BTL) OPTIME
TOPICAL | Status: DC | PRN
  Administered 2020-10-11: 1000 mL

## 2020-10-11 MED ORDER — LACTATED RINGERS IV SOLN: INTRAVENOUS | Status: DC

## 2020-10-11 MED ORDER — OXYCODONE HCL 5 MG/5ML PO SOLN: 5.0000 mg | Freq: Once | ORAL | Status: DC | PRN

## 2020-10-11 MED ORDER — ADULT MULTIVITAMIN W/MINERALS CH
1.0000 | ORAL_TABLET | Freq: Every day | ORAL | Status: DC
  Administered 2020-10-11: 1 via ORAL
  Filled 2020-10-11: qty 1

## 2020-10-11 MED ORDER — CHLORHEXIDINE GLUCONATE CLOTH 2 % EX PADS: 6.0000 | MEDICATED_PAD | Freq: Once | CUTANEOUS | Status: DC

## 2020-10-11 MED ORDER — POLYETHYLENE GLYCOL 3350 17 G PO PACK
17.0000 g | PACK | Freq: Every day | ORAL | Status: DC
  Administered 2020-10-11: 17 g via ORAL
  Filled 2020-10-11: qty 1

## 2020-10-11 MED ORDER — DUTASTERIDE 0.5 MG PO CAPS
0.5000 mg | ORAL_CAPSULE | Freq: Every day | ORAL | Status: DC
  Filled 2020-10-11: qty 1

## 2020-10-11 MED ORDER — SODIUM CHLORIDE 0.9% FLUSH: 3.0000 mL | INTRAVENOUS | Status: DC | PRN

## 2020-10-11 MED ORDER — MAGNESIUM CITRATE PO SOLN: 1.0000 | Freq: Once | ORAL | Status: DC | PRN

## 2020-10-11 MED ORDER — METHOCARBAMOL 1000 MG/10ML IJ SOLN
500.0000 mg | Freq: Four times a day (QID) | INTRAVENOUS | Status: DC | PRN
  Filled 2020-10-11: qty 5

## 2020-10-11 MED ORDER — LIDOCAINE 2% (20 MG/ML) 5 ML SYRINGE
INTRAMUSCULAR | Status: DC | PRN
  Administered 2020-10-11: 100 mg via INTRAVENOUS

## 2020-10-11 MED ORDER — SODIUM FLUORIDE 1.1 % DT PSTE: 1.0000 "application " | PASTE | Freq: Two times a day (BID) | DENTAL | Status: DC

## 2020-10-11 MED ORDER — AMLODIPINE BESYLATE 5 MG PO TABS: 2.5000 mg | ORAL_TABLET | Freq: Every day | ORAL | Status: DC | PRN

## 2020-10-11 MED ORDER — CEFAZOLIN SODIUM-DEXTROSE 2-4 GM/100ML-% IV SOLN
2.0000 g | INTRAVENOUS | Status: AC
  Administered 2020-10-11: 2 g via INTRAVENOUS
  Filled 2020-10-11: qty 100

## 2020-10-11 MED ORDER — ENOXAPARIN SODIUM 40 MG/0.4ML IJ SOSY
40.0000 mg | PREFILLED_SYRINGE | INTRAMUSCULAR | Status: DC
  Filled 2020-10-11: qty 0.4

## 2020-10-11 MED ORDER — VITAMIN D 25 MCG (1000 UNIT) PO TABS
1000.0000 [IU] | ORAL_TABLET | Freq: Every day | ORAL | Status: DC
  Administered 2020-10-12: 1000 [IU] via ORAL
  Filled 2020-10-11: qty 1

## 2020-10-11 MED ORDER — PROPOFOL 10 MG/ML IV BOLUS
INTRAVENOUS | Status: DC | PRN
  Administered 2020-10-11: 150 mg via INTRAVENOUS

## 2020-10-11 MED ORDER — PHENYLEPHRINE HCL-NACL 10-0.9 MG/250ML-% IV SOLN
INTRAVENOUS | Status: DC | PRN
  Administered 2020-10-11: 15 ug/min via INTRAVENOUS

## 2020-10-11 MED ORDER — SODIUM CHLORIDE 0.9 % IV SOLN: 250.0000 mL | INTRAVENOUS | Status: DC

## 2020-10-11 MED ORDER — KETOROLAC TROMETHAMINE 15 MG/ML IJ SOLN
7.5000 mg | Freq: Four times a day (QID) | INTRAMUSCULAR | Status: DC
  Administered 2020-10-11 – 2020-10-12 (×2): 7.5 mg via INTRAVENOUS
  Filled 2020-10-11 (×2): qty 1

## 2020-10-11 MED ORDER — METHOCARBAMOL 500 MG PO TABS
500.0000 mg | ORAL_TABLET | Freq: Four times a day (QID) | ORAL | Status: DC | PRN
  Administered 2020-10-11 – 2020-10-12 (×3): 500 mg via ORAL
  Filled 2020-10-11 (×2): qty 1

## 2020-10-11 MED ORDER — THROMBIN 5000 UNITS EX SOLR
CUTANEOUS | Status: AC
  Filled 2020-10-11: qty 5000

## 2020-10-11 MED ORDER — ROPINIROLE HCL 1 MG PO TABS
2.0000 mg | ORAL_TABLET | Freq: Three times a day (TID) | ORAL | Status: DC
  Administered 2020-10-12 (×2): 2 mg via ORAL
  Filled 2020-10-11 (×2): qty 2

## 2020-10-11 MED ORDER — LIDOCAINE-EPINEPHRINE 1 %-1:100000 IJ SOLN
INTRAMUSCULAR | Status: AC
  Filled 2020-10-11: qty 1

## 2020-10-11 MED ORDER — POLYETHYLENE GLYCOL 3350 17 G PO PACK
17.0000 g | PACK | Freq: Every day | ORAL | Status: DC
  Filled 2020-10-11: qty 1

## 2020-10-11 MED ORDER — LIDOCAINE-EPINEPHRINE 1 %-1:100000 IJ SOLN
INTRAMUSCULAR | Status: DC | PRN
  Administered 2020-10-11: 5 mL

## 2020-10-11 MED ORDER — FENTANYL CITRATE (PF) 100 MCG/2ML IJ SOLN
INTRAMUSCULAR | Status: AC
  Filled 2020-10-11: qty 2

## 2020-10-11 MED ORDER — PANTOPRAZOLE SODIUM 40 MG IV SOLR: 40.0000 mg | Freq: Every day | INTRAVENOUS | Status: DC

## 2020-10-11 MED ORDER — SODIUM CHLORIDE 0.9% FLUSH: 3.0000 mL | Freq: Two times a day (BID) | INTRAVENOUS | Status: DC

## 2020-10-11 MED ORDER — CHLORHEXIDINE GLUCONATE 0.12 % MT SOLN
15.0000 mL | Freq: Once | OROMUCOSAL | Status: AC
  Administered 2020-10-11: 15 mL via OROMUCOSAL
  Filled 2020-10-11: qty 15

## 2020-10-11 MED ORDER — ONDANSETRON HCL 4 MG PO TABS: 4.0000 mg | ORAL_TABLET | Freq: Four times a day (QID) | ORAL | Status: DC | PRN

## 2020-10-11 MED ORDER — CALCIUM CARBONATE ANTACID 500 MG PO CHEW: 1.0000 | CHEWABLE_TABLET | Freq: Every day | ORAL | Status: DC | PRN

## 2020-10-11 MED ORDER — DOCUSATE SODIUM 100 MG PO CAPS
100.0000 mg | ORAL_CAPSULE | Freq: Two times a day (BID) | ORAL | Status: DC
  Administered 2020-10-11 – 2020-10-12 (×2): 100 mg via ORAL
  Filled 2020-10-11 (×2): qty 1

## 2020-10-11 MED ORDER — LISINOPRIL 20 MG PO TABS
30.0000 mg | ORAL_TABLET | Freq: Every day | ORAL | Status: DC
  Administered 2020-10-11: 30 mg via ORAL
  Filled 2020-10-11: qty 1

## 2020-10-11 MED ORDER — FENTANYL CITRATE (PF) 250 MCG/5ML IJ SOLN
INTRAMUSCULAR | Status: AC
  Filled 2020-10-11: qty 5

## 2020-10-11 MED ORDER — BUPIVACAINE HCL (PF) 0.5 % IJ SOLN
INTRAMUSCULAR | Status: DC | PRN
  Administered 2020-10-11: 5 mL

## 2020-10-11 MED ORDER — TAMSULOSIN HCL 0.4 MG PO CAPS: 0.4000 mg | ORAL_CAPSULE | Freq: Every day | ORAL | Status: DC

## 2020-10-11 MED ORDER — AMANTADINE HCL 100 MG PO CAPS
100.0000 mg | ORAL_CAPSULE | Freq: Three times a day (TID) | ORAL | Status: DC
  Administered 2020-10-11 – 2020-10-12 (×2): 100 mg via ORAL
  Filled 2020-10-11 (×4): qty 1

## 2020-10-11 MED ORDER — CEFAZOLIN SODIUM-DEXTROSE 2-4 GM/100ML-% IV SOLN
2.0000 g | Freq: Three times a day (TID) | INTRAVENOUS | Status: AC
  Administered 2020-10-11 – 2020-10-12 (×2): 2 g via INTRAVENOUS
  Filled 2020-10-11 (×2): qty 100

## 2020-10-11 MED ORDER — HYDRALAZINE HCL 20 MG/ML IJ SOLN
5.0000 mg | Freq: Once | INTRAMUSCULAR | Status: AC
  Administered 2020-10-11: 5 mg via INTRAVENOUS

## 2020-10-11 MED ORDER — PANTOPRAZOLE SODIUM 40 MG PO TBEC
40.0000 mg | DELAYED_RELEASE_TABLET | Freq: Every day | ORAL | Status: DC
  Administered 2020-10-11 – 2020-10-12 (×2): 40 mg via ORAL
  Filled 2020-10-11 (×2): qty 1

## 2020-10-11 MED ORDER — BISACODYL 10 MG RE SUPP: 10.0000 mg | Freq: Every day | RECTAL | Status: DC | PRN

## 2020-10-11 MED ORDER — DICLOFENAC SODIUM 1 % EX GEL
4.0000 g | Freq: Four times a day (QID) | CUTANEOUS | Status: DC | PRN
  Filled 2020-10-11: qty 100

## 2020-10-11 MED ORDER — HYDRALAZINE HCL 10 MG PO TABS: 25.0000 mg | ORAL_TABLET | Freq: Every day | ORAL | Status: DC | PRN

## 2020-10-11 MED ORDER — PROPOFOL 10 MG/ML IV BOLUS
INTRAVENOUS | Status: AC
  Filled 2020-10-11: qty 20

## 2020-10-11 MED ORDER — AMISULPRIDE (ANTIEMETIC) 5 MG/2ML IV SOLN: 10.0000 mg | Freq: Once | INTRAVENOUS | Status: DC | PRN

## 2020-10-11 MED ORDER — HYDROCODONE-ACETAMINOPHEN 5-325 MG PO TABS: 2.0000 | ORAL_TABLET | ORAL | Status: DC | PRN

## 2020-10-11 MED ORDER — ORAL CARE MOUTH RINSE
15.0000 mL | Freq: Once | OROMUCOSAL | Status: AC
Start: 2020-10-11 — End: 2020-10-11

## 2020-10-11 MED ORDER — POLYETHYLENE GLYCOL 3350 17 G PO PACK: 17.0000 g | PACK | Freq: Every day | ORAL | Status: DC

## 2020-10-11 MED ORDER — PROMETHAZINE HCL 25 MG/ML IJ SOLN: 6.2500 mg | INTRAMUSCULAR | Status: DC | PRN

## 2020-10-11 MED ORDER — SUGAMMADEX SODIUM 200 MG/2ML IV SOLN
INTRAVENOUS | Status: DC | PRN
  Administered 2020-10-11: 200 mg via INTRAVENOUS

## 2020-10-11 MED ORDER — ONDANSETRON HCL 4 MG/2ML IJ SOLN: 4.0000 mg | Freq: Four times a day (QID) | INTRAMUSCULAR | Status: DC | PRN

## 2020-10-11 MED ORDER — METHYLPREDNISOLONE ACETATE 80 MG/ML IJ SUSP
INTRAMUSCULAR | Status: AC
  Filled 2020-10-11: qty 1

## 2020-10-11 MED ORDER — POLYVINYL ALCOHOL 1.4 % OP SOLN
1.0000 [drp] | Freq: Every day | OPHTHALMIC | Status: DC | PRN
  Filled 2020-10-11: qty 15

## 2020-10-11 MED ORDER — ACETAMINOPHEN 325 MG PO TABS: 650.0000 mg | ORAL_TABLET | ORAL | Status: DC | PRN

## 2020-10-11 MED ORDER — ONDANSETRON HCL 4 MG/2ML IJ SOLN
INTRAMUSCULAR | Status: DC | PRN
  Administered 2020-10-11: 4 mg via INTRAVENOUS

## 2020-10-11 MED ORDER — OXYCODONE HCL 5 MG PO TABS: 5.0000 mg | ORAL_TABLET | Freq: Once | ORAL | Status: DC | PRN

## 2020-10-11 MED ORDER — THROMBIN 5000 UNITS EX SOLR: OROMUCOSAL | Status: DC | PRN

## 2020-10-11 MED ORDER — TRAZODONE HCL 50 MG PO TABS: 50.0000 mg | ORAL_TABLET | Freq: Every evening | ORAL | Status: DC | PRN

## 2020-10-11 MED ORDER — POLYETHYLENE GLYCOL 3350 17 G PO PACK
17.0000 g | PACK | Freq: Every day | ORAL | Status: DC | PRN
  Administered 2020-10-11: 17 g via ORAL

## 2020-10-11 MED ORDER — DEXAMETHASONE SODIUM PHOSPHATE 10 MG/ML IJ SOLN
INTRAMUSCULAR | Status: DC | PRN
  Administered 2020-10-11: 10 mg via INTRAVENOUS

## 2020-10-11 MED ORDER — KCL IN DEXTROSE-NACL 20-5-0.45 MEQ/L-%-% IV SOLN: INTRAVENOUS | Status: DC

## 2020-10-11 MED ORDER — HYDROMORPHONE HCL 1 MG/ML IJ SOLN: 0.5000 mg | INTRAMUSCULAR | Status: DC | PRN

## 2020-10-11 MED ORDER — HYDRALAZINE HCL 20 MG/ML IJ SOLN
INTRAMUSCULAR | Status: AC
  Filled 2020-10-11: qty 1

## 2020-10-11 MED ORDER — MELATONIN 5 MG PO TABS
5.0000 mg | ORAL_TABLET | Freq: Every day | ORAL | Status: DC
  Administered 2020-10-11: 5 mg via ORAL
  Filled 2020-10-11 (×2): qty 1

## 2020-10-11 MED ORDER — METOPROLOL SUCCINATE ER 25 MG PO TB24
25.0000 mg | ORAL_TABLET | Freq: Every day | ORAL | Status: DC
  Administered 2020-10-11: 25 mg via ORAL
  Filled 2020-10-11: qty 1

## 2020-10-11 MED ORDER — ONDANSETRON HCL 4 MG/2ML IJ SOLN
INTRAMUSCULAR | Status: AC
  Filled 2020-10-11: qty 2

## 2020-10-11 MED ORDER — METHYLPREDNISOLONE ACETATE 80 MG/ML IJ SUSP
INTRAMUSCULAR | Status: DC | PRN
  Administered 2020-10-11: 80 mg

## 2020-10-11 MED ORDER — BUPIVACAINE HCL (PF) 0.5 % IJ SOLN
INTRAMUSCULAR | Status: AC
  Filled 2020-10-11: qty 30

## 2020-10-11 MED ORDER — ALUM & MAG HYDROXIDE-SIMETH 200-200-20 MG/5ML PO SUSP: 30.0000 mL | Freq: Four times a day (QID) | ORAL | Status: DC | PRN

## 2020-10-11 SURGICAL SUPPLY — 48 items
BAG COUNTER SPONGE SURGICOUNT (BAG) ×4 IMPLANT
BAG SURGICOUNT SPONGE COUNTING (BAG) ×2
BAND RUBBER #18 3X1/16 STRL (MISCELLANEOUS) ×6 IMPLANT
BUR MATCHSTICK NEURO 3.0 LAGG (BURR) ×3 IMPLANT
BUR ROUND FLUTED 5 RND (BURR) ×2 IMPLANT
BUR ROUND FLUTED 5MM RND (BURR) ×1
CANISTER SUCT 3000ML PPV (MISCELLANEOUS) ×3 IMPLANT
CARTRIDGE OIL MAESTRO DRILL (MISCELLANEOUS) ×1 IMPLANT
DECANTER SPIKE VIAL GLASS SM (MISCELLANEOUS) ×3 IMPLANT
DERMABOND ADVANCED (GAUZE/BANDAGES/DRESSINGS) ×2
DERMABOND ADVANCED .7 DNX12 (GAUZE/BANDAGES/DRESSINGS) ×1 IMPLANT
DIFFUSER DRILL AIR PNEUMATIC (MISCELLANEOUS) ×3 IMPLANT
DRAPE LAPAROTOMY 100X72X124 (DRAPES) ×3 IMPLANT
DRAPE MICROSCOPE LEICA (MISCELLANEOUS) ×3 IMPLANT
DRAPE SURG 17X23 STRL (DRAPES) ×3 IMPLANT
DRSG OPSITE POSTOP 4X6 (GAUZE/BANDAGES/DRESSINGS) ×3 IMPLANT
DURAPREP 26ML APPLICATOR (WOUND CARE) ×3 IMPLANT
ELECT REM PT RETURN 9FT ADLT (ELECTROSURGICAL) ×3
ELECTRODE REM PT RTRN 9FT ADLT (ELECTROSURGICAL) ×1 IMPLANT
GAUZE 4X4 16PLY ~~LOC~~+RFID DBL (SPONGE) ×3 IMPLANT
GAUZE SPONGE 4X4 12PLY STRL (GAUZE/BANDAGES/DRESSINGS) IMPLANT
GLOVE SRG 8 PF TXTR STRL LF DI (GLOVE) ×2 IMPLANT
GLOVE SURG ENC MOIS LTX SZ8 (GLOVE) ×3 IMPLANT
GLOVE SURG LTX SZ8 (GLOVE) ×3 IMPLANT
GLOVE SURG UNDER POLY LF SZ8 (GLOVE) ×4
GLOVE SURG UNDER POLY LF SZ8.5 (GLOVE) ×6 IMPLANT
GOWN STRL REUS W/ TWL XL LVL3 (GOWN DISPOSABLE) ×3 IMPLANT
GOWN STRL REUS W/TWL 2XL LVL3 (GOWN DISPOSABLE) ×9 IMPLANT
GOWN STRL REUS W/TWL XL LVL3 (GOWN DISPOSABLE) ×6
HEMOSTAT POWDER KIT SURGIFOAM (HEMOSTASIS) ×3 IMPLANT
KIT BASIN OR (CUSTOM PROCEDURE TRAY) ×3 IMPLANT
KIT TURNOVER KIT B (KITS) ×3 IMPLANT
NEEDLE HYPO 18GX1.5 BLUNT FILL (NEEDLE) IMPLANT
NEEDLE HYPO 25X1 1.5 SAFETY (NEEDLE) ×3 IMPLANT
NEEDLE SPNL 18GX3.5 QUINCKE PK (NEEDLE) ×3 IMPLANT
NS IRRIG 1000ML POUR BTL (IV SOLUTION) ×3 IMPLANT
OIL CARTRIDGE MAESTRO DRILL (MISCELLANEOUS) ×3
PACK LAMINECTOMY NEURO (CUSTOM PROCEDURE TRAY) ×3 IMPLANT
SPONGE SURGIFOAM ABS GEL SZ50 (HEMOSTASIS) IMPLANT
STAPLER SKIN PROX WIDE 3.9 (STAPLE) ×3 IMPLANT
SUT VIC AB 0 CT1 18XCR BRD8 (SUTURE) ×1 IMPLANT
SUT VIC AB 0 CT1 8-18 (SUTURE) ×2
SUT VIC AB 2-0 CT1 18 (SUTURE) ×3 IMPLANT
SUT VIC AB 3-0 SH 8-18 (SUTURE) ×6 IMPLANT
SYR 5ML LL (SYRINGE) IMPLANT
TOWEL GREEN STERILE (TOWEL DISPOSABLE) ×3 IMPLANT
TOWEL GREEN STERILE FF (TOWEL DISPOSABLE) ×3 IMPLANT
WATER STERILE IRR 1000ML POUR (IV SOLUTION) ×3 IMPLANT

## 2020-10-11 NOTE — Interval H&P Note (Signed)
History and Physical Interval Note:  10/11/2020 3:09 PM  Thomas [name]  has presented today for surgery, with the diagnosis of Herniated nucleus pulposus, Lumbar.  The various methods of treatment have been discussed with the patient and family. After consideration of risks, benefits and other options for treatment, the patient has consented to  Procedure(s): Left Lumbar 3-4, Lumbar 4-5 Microdiscectomy (Left) as a surgical intervention.  The patient's history has been reviewed, patient examined, no change in status, stable for surgery.  I have reviewed the patient's chart and labs.  Questions were answered to the patient's satisfaction.     Peggyann Shoals

## 2020-10-11 NOTE — Anesthesia Procedure Notes (Signed)
Procedure Name: Intubation Date/Time: 10/11/2020 4:26 PM Performed by: Georgia Duff, CRNA Pre-anesthesia Checklist: Patient identified, Emergency Drugs available, Suction available and Patient being monitored Patient Re-evaluated:Patient Re-evaluated prior to induction Oxygen Delivery Method: Circle System Utilized Preoxygenation: Pre-oxygenation with 100% oxygen Induction Type: IV induction Ventilation: Mask ventilation without difficulty Laryngoscope Size: Miller and 3 Grade View: Grade I Tube type: Oral Tube size: 7.5 mm Number of attempts: 1 Airway Equipment and Method: Stylet and Oral airway Placement Confirmation: ETT inserted through vocal cords under direct vision, positive ETCO2 and breath sounds checked- equal and bilateral Secured at: 23 cm Tube secured with: Tape Dental Injury: Teeth and Oropharynx as per pre-operative assessment

## 2020-10-11 NOTE — Transfer of Care (Signed)
Immediate Anesthesia Transfer of Care Note  Patient: Thomas [name]  Procedure(s) Performed: Left Lumbar Three-Four, Lumbar Fou-Five  Microdiscectomy (Left: Spine Lumbar)  Patient Location: PACU  Anesthesia Type:General  Level of Consciousness: drowsy and patient cooperative  Airway & Oxygen Therapy: Patient Spontanous Breathing  Post-op Assessment: Report given to RN and Post -op Vital signs reviewed and stable  Post vital signs: Reviewed and stable  Last Vitals:  Vitals Value Taken Time  BP 153/84 10/11/20 1823  Temp 36.1 C 10/11/20 1823  Pulse 58 10/11/20 1824  Resp 13 10/11/20 1824  SpO2 95 % 10/11/20 1824  Vitals shown include unvalidated device data.  Last Pain:  Vitals:   10/11/20 1322  TempSrc:   PainSc: 0-No pain         Complications: No notable events documented.

## 2020-10-11 NOTE — Brief Op Note (Signed)
10/11/2020  6:06 PM  PATIENT:  Thomas [name]  70 y.o. male  PRE-OPERATIVE DIAGNOSIS:  Herniated nucleus pulposus, Lumbar L 34 left and L 45 left with radiculopathy, stenosis, lumbago  POST-OPERATIVE DIAGNOSIS:  Herniated nucleus pulposus, Lumbar L 34 left and L 45 left with radiculopathy, stenosis, lumbago  PROCEDURE:  Procedure(s): Left Lumbar 3-4, Lumbar 4-5 Microdiscectomy (Left)  SURGEON:  Surgeon(s) and Role:    Erline Levine, MD - Primary  PHYSICIAN ASSISTANT: Glenford Peers, NP  ASSISTANTS: none   ANESTHESIA:   general  EBL:  50 mL   BLOOD ADMINISTERED:none  DRAINS: none   LOCAL MEDICATIONS USED:  MARCAINE    and LIDOCAINE   SPECIMEN:  No Specimen  DISPOSITION OF SPECIMEN:  N/A  COUNTS:  YES  TOURNIQUET:  * No tourniquets in log *  DICTATION: Patient has a large L 34 disc rupture on the left with significant left leg weakness and a disc herniation on the left at L 45 with lateral recess stenosis. It was elected to take him to surgery for left L 34 microdiscectomy and left L 45 microdiscectomy.  Procedure: Patient was brought to the operating room and following the smooth and uncomplicated induction of general endotracheal anesthesia he was placed in a prone position on the Wilson frame. Low back was prepped and draped in the usual sterile fashion with betadine scrub and DuraPrep. Preoperative localizing X ray was obtained with a spinal needle.  Area of planned incision was infiltrated with local lidocaine. Incision was made in the midline and carried to the lumbodorsal fascia which was incised on the right side of midline. Subperiosteal dissection was performed exposing what was felt to be L 34 and L 45 levels. Intraoperative x-ray demonstrated marker probe at L 34 and L 45.  A hemi-semi-laminectomy of L 3 was performed a high-speed drill and completed with Kerrison rongeurs and a generous foraminotomy was performed overlying the superior aspect of the L 4 lamina. Ligamentum  flavum was detached and removed in a piecemeal fashion and the L 4 nerve root was decompressed laterally with removal of the superior aspect of the facet and ligamentum causing nerve root compression. The microscope was brought into the field and the L 4 nerve root was mobilized medially. This exposed a large amount of soft disc material and a superiorly migrated free fragment of herniated disc material. Multiple fragments were removed and these extended into the interspace which appeared to be quite soft with a disrupted annulus overlying the interspace. As a result it was elected to further decompress the interspace and remove loose disc material and this was done with a variety of pituitary rongeurs. The redundant annulus was also removed with 2 mm Kerrison rongeur. There was evidence on the MRI of cephalad fragments and these were carefully removed and the L 3 nerve root was also decompressed. At this point it was felt that all neural elements were well decompressed and there was no evidence of residual loose disc material within the interspace. Attention was then turned to the L 45 level.  A hemi-semi-laminectomy of L 4 was performed a high-speed drill and completed with Kerrison rongeurs and a generous foraminotomy was performed overlying the superior aspect of the L 5 lamina. Ligamentum flavum was detached and removed in a piecemeal fashion and the L 5 nerve root was decompressed laterally with removal of the superior aspect of the facet and ligamentum causing nerve root compression. The microscope was brought into the field and the L 5  nerve root was mobilized medially. The lateral recess was extensively decompressed and I did not feel that the disc herniation was sufficiently large to warrant incising the annulus once the neural elements had been decompressed.  The interspace was then irrigated with saline and no additional disc material was mobilized. Hemostasis was assured with bipolar electrocautery and  the interspace was irrigated with Depo-Medrol and fentanyl. The lumbodorsal fascia was closed with 0 Vicryl sutures the subcutaneous tissues reapproximated 2-0 Vicryl inverted sutures and the skin edges were reapproximated with 3-0 Vicryl subcuticular stitch. The wound is dressed with Dermabond and an occlusive dressing. Patient was extubated in the operating room and taken to recovery in stable and satisfactory condition having tolerated his operation well counts were correct at the end of the case.   PLAN OF CARE: Admit for overnight observation  PATIENT DISPOSITION:  PACU - hemodynamically stable.   Delay start of Pharmacological VTE agent (>24hrs) due to surgical blood loss or risk of bleeding: yes

## 2020-10-11 NOTE — Op Note (Signed)
10/11/2020  6:06 PM  PATIENT:  Thomas [name]  70 y.o. male  PRE-OPERATIVE DIAGNOSIS:  Herniated nucleus pulposus, Lumbar L 34 left and L 45 left with radiculopathy, stenosis, lumbago  POST-OPERATIVE DIAGNOSIS:  Herniated nucleus pulposus, Lumbar L 34 left and L 45 left with radiculopathy, stenosis, lumbago  PROCEDURE:  Procedure(s): Left Lumbar 3-4, Lumbar 4-5 Microdiscectomy (Left)  SURGEON:  Surgeon(s) and Role:    Erline Levine, MD - Primary  PHYSICIAN ASSISTANT: Glenford Peers, NP  ASSISTANTS: none   ANESTHESIA:   general  EBL:  50 mL   BLOOD ADMINISTERED:none  DRAINS: none   LOCAL MEDICATIONS USED:  MARCAINE    and LIDOCAINE   SPECIMEN:  No Specimen  DISPOSITION OF SPECIMEN:  N/A  COUNTS:  YES  TOURNIQUET:  * No tourniquets in log *  DICTATION: Patient has a large L 34 disc rupture on the left with significant left leg weakness and a disc herniation on the left at L 45 with lateral recess stenosis. It was elected to take him to surgery for left L 34 microdiscectomy and left L 45 microdiscectomy.  Procedure: Patient was brought to the operating room and following the smooth and uncomplicated induction of general endotracheal anesthesia he was placed in a prone position on the Wilson frame. Low back was prepped and draped in the usual sterile fashion with betadine scrub and DuraPrep. Preoperative localizing X ray was obtained with a spinal needle.  Area of planned incision was infiltrated with local lidocaine. Incision was made in the midline and carried to the lumbodorsal fascia which was incised on the right side of midline. Subperiosteal dissection was performed exposing what was felt to be L 34 and L 45 levels. Intraoperative x-ray demonstrated marker probe at L 34 and L 45.  A hemi-semi-laminectomy of L 3 was performed a high-speed drill and completed with Kerrison rongeurs and a generous foraminotomy was performed overlying the superior aspect of the L 4 lamina. Ligamentum  flavum was detached and removed in a piecemeal fashion and the L 4 nerve root was decompressed laterally with removal of the superior aspect of the facet and ligamentum causing nerve root compression. The microscope was brought into the field and the L 4 nerve root was mobilized medially. This exposed a large amount of soft disc material and a superiorly migrated free fragment of herniated disc material. Multiple fragments were removed and these extended into the interspace which appeared to be quite soft with a disrupted annulus overlying the interspace. As a result it was elected to further decompress the interspace and remove loose disc material and this was done with a variety of pituitary rongeurs. The redundant annulus was also removed with 2 mm Kerrison rongeur. There was evidence on the MRI of cephalad fragments and these were carefully removed and the L 3 nerve root was also decompressed. At this point it was felt that all neural elements were well decompressed and there was no evidence of residual loose disc material within the interspace. Attention was then turned to the L 45 level.  A hemi-semi-laminectomy of L 4 was performed a high-speed drill and completed with Kerrison rongeurs and a generous foraminotomy was performed overlying the superior aspect of the L 5 lamina. Ligamentum flavum was detached and removed in a piecemeal fashion and the L 5 nerve root was decompressed laterally with removal of the superior aspect of the facet and ligamentum causing nerve root compression. The microscope was brought into the field and the L 5  nerve root was mobilized medially. The lateral recess was extensively decompressed and I did not feel that the disc herniation was sufficiently large to warrant incising the annulus once the neural elements had been decompressed.  The interspace was then irrigated with saline and no additional disc material was mobilized. Hemostasis was assured with bipolar electrocautery and  the interspace was irrigated with Depo-Medrol and fentanyl. The lumbodorsal fascia was closed with 0 Vicryl sutures the subcutaneous tissues reapproximated 2-0 Vicryl inverted sutures and the skin edges were reapproximated with 3-0 Vicryl subcuticular stitch. The wound is dressed with Dermabond and an occlusive dressing. Patient was extubated in the operating room and taken to recovery in stable and satisfactory condition having tolerated his operation well counts were correct at the end of the case.   PLAN OF CARE: Admit for overnight observation  PATIENT DISPOSITION:  PACU - hemodynamically stable.   Delay start of Pharmacological VTE agent (>24hrs) due to surgical blood loss or risk of bleeding: yes

## 2020-10-12 ENCOUNTER — Encounter (HOSPITAL_COMMUNITY): Payer: Self-pay | Admitting: Neurosurgery

## 2020-10-12 DIAGNOSIS — M7138 Other bursal cyst, other site: Secondary | ICD-10-CM | POA: Diagnosis not present

## 2020-10-12 DIAGNOSIS — I10 Essential (primary) hypertension: Secondary | ICD-10-CM | POA: Diagnosis not present

## 2020-10-12 DIAGNOSIS — M4156 Other secondary scoliosis, lumbar region: Secondary | ICD-10-CM | POA: Diagnosis not present

## 2020-10-12 DIAGNOSIS — S32009K Unspecified fracture of unspecified lumbar vertebra, subsequent encounter for fracture with nonunion: Secondary | ICD-10-CM | POA: Diagnosis not present

## 2020-10-12 DIAGNOSIS — M48061 Spinal stenosis, lumbar region without neurogenic claudication: Secondary | ICD-10-CM | POA: Diagnosis not present

## 2020-10-12 DIAGNOSIS — M5116 Intervertebral disc disorders with radiculopathy, lumbar region: Secondary | ICD-10-CM | POA: Diagnosis not present

## 2020-10-12 MED ORDER — METHOCARBAMOL 500 MG PO TABS: 500.0000 mg | ORAL_TABLET | Freq: Four times a day (QID) | ORAL | 0 refills | Status: DC | PRN

## 2020-10-12 MED ORDER — HYDROCODONE-ACETAMINOPHEN 5-325 MG PO TABS: 0.5000 | ORAL_TABLET | ORAL | 0 refills | Status: DC | PRN

## 2020-10-12 NOTE — Progress Notes (Signed)
Subjective: Patient reports that he is doing well and is pleased with his postoperative status. He has ambulated multiple times in the hallway and has had a significant reduction in his preoperative symptoms.   Objective: Vital signs in last 24 hours: Temp:  [97 F (36.1 C)-98.1 F (36.7 C)] 98.1 F (36.7 C) (07/01 0751) Pulse Rate:  [57-67] 63 (07/01 0751) Resp:  [13-20] 18 (07/01 0751) BP: (124-189)/(67-86) 124/70 (07/01 0751) SpO2:  [96 %-100 %] 97 % (07/01 0751) Weight:  [102.1 kg] 102.1 kg (06/30 1300)  Intake/Output from previous day: 06/30 0701 - 07/01 0700 In: 800 [I.V.:800] Out: 625 [Urine:575; Blood:50] Intake/Output this shift: No intake/output data recorded.  Physical Exam: Patient is awake, A/O X 4, conversant, and in good spirits. Speech is fluent and appropriate. Doing well. MAEW with good strength that is symmetric bilaterally. 5/5 BUE/BLE.   Dressing is clean dry intact. Incision is well approximated with no drainage, erythema, or edema.     Lab Results: No results for input(s): WBC, HGB, HCT, PLT in the last 72 hours. BMET No results for input(s): NA, K, CL, CO2, GLUCOSE, BUN, CREATININE, CALCIUM in the last 72 hours.  Studies/Results: DG Lumbar Spine 2-3 Views  Result Date: 10/11/2020 CLINICAL DATA:  L3 through L5 micro discectomy. EXAM: LUMBAR SPINE - 2-3 VIEW COMPARISON:  MRI of June 07, 2020. FINDINGS: Three intraoperative cross-table lateral projections were obtained of the lumbar spine. The first image demonstrates surgical probes directed to the posterior portions of the L3-4 and L4-5 disc spaces. The second image demonstrates no probes at all. The third image demonstrates surgical probe directed toward posterior elements of L3 with another directed toward posterior margin of L3-4 disc space. IMPRESSION: Surgical localization as described above. Electronically Signed   By: Marijo Conception M.D.   On: 10/11/2020 21:00    Assessment/Plan: Patient is  post-op day 1 s/p Left Lumbar 3-4, Lumbar 4-5 Microdiscectomy. He is recovering well and reports a resolution of his preoperative symptoms. His only complaint is mild incisional discomfort.  He has ambulated well with nursing staff and is awaiting PT/OT evaluation. Continue working on pain control, mobility and ambulating patient. Will plan for discharge today.    LOS: 0 days     Marvis Moeller, DNP, NP-C 10/12/2020, 7:57 AM

## 2020-10-12 NOTE — Evaluation (Signed)
Physical Therapy Evaluation Patient Details Name: Thomas [name] MRN: [id number] DOB: Dec 27, 1950 Today's Date: 10/12/2020   History of Present Illness  70 yo M diagnosed with a herniated lumbar discs L3-4 and L4-5 on the left with left-sided nerve root compression.  PMH includes: Parkinson's disease and a history of hypercoagulability and blood clot with pulmonary embolism.  He is s/p left L3-4 and left L4-5 microdiskectomy on 6/30.   Clinical Impression  Pt admitted with above diagnosis. At the time of PT eval, pt was able to demonstrate transfers and ambulation with gross min guard assist to supervision for safety with RW for support. Pt reports he has been utilizing a hemi-walker at home and feel a RW will give him more support and facilitate a smoother cadence. Pt was educated on precautions, positioning recommendations, appropriate activity progression, and car transfer. Pt currently with functional limitations due to the deficits listed below (see PT Problem List). Pt will benefit from skilled PT to increase their independence and safety with mobility to allow discharge to the venue listed below.      Follow Up Recommendations No PT follow up;Supervision for mobility/OOB    Equipment Recommendations  Rolling walker with 5" wheels    Recommendations for Other Services       Precautions / Restrictions Precautions Precautions: Back Precaution Booklet Issued: Yes (comment) Precaution Comments: per PT Restrictions Weight Bearing Restrictions: No      Mobility  Bed Mobility Overal bed mobility: Needs Assistance Bed Mobility: Rolling;Sidelying to Sit;Sit to Sidelying Rolling: Supervision Sidelying to sit: Supervision Supine to sit: Supervision Sit to supine: Supervision Sit to sidelying: Supervision General bed mobility comments: VC's for optimal log roll technique. HOB flat but use of rails to simulate home environment.    Transfers Overall transfer level: Needs  assistance Equipment used: Rolling walker (2 wheeled) Transfers: Sit to/from [facility] Sit to Stand: Min guard Stand pivot transfers: Supervision       General transfer comment: Close guard for safety as pt powered up to full stand.  Ambulation/Gait Ambulation/Gait assistance: Min guard Gait Distance (Feet): 300 Feet Assistive device: Rolling walker (2 wheeled) Gait Pattern/deviations: Step-through pattern;Decreased stride length;Trunk flexed Gait velocity: Decreased Gait velocity interpretation: <1.31 ft/sec, indicative of household ambulator General Gait Details: VC's for improved posture, closer walker proximity, and forward gaze. Pt with generally flexed trunk and mild Parkinsonian gait pattern. Able to make some corrective changes but unable to maintain.  Stairs Stairs: Yes Stairs assistance: Min guard Stair Management: One rail Left;Step to pattern;Forwards Number of Stairs: 3 General stair comments: VC's for sequencing and general safety. No assist required however hands on guarding provided for safety.  Wheelchair Mobility    Modified Rankin (Stroke Patients Only)       Balance Overall balance assessment: Needs assistance Sitting-balance support: Feet supported;No upper extremity supported Sitting balance-Leahy Scale: Fair   Postural control: Posterior lean Standing balance support: Bilateral upper extremity supported Standing balance-Leahy Scale: Poor Standing balance comment: recommending RW                             Pertinent Vitals/Pain Pain Assessment: Faces Faces Pain Scale: Hurts little more Pain Location: incisional Pain Descriptors / Indicators: Operative site guarding;Sore Pain Intervention(s): Limited activity within patient's tolerance;Monitored during session;Repositioned    Home Living Family/patient expects to be discharged to:: Private residence Living Arrangements: Spouse/significant other Available Help at  Discharge: Family;Available 24 hours/day Type of Home: [place]  Access: Stairs to enter Entrance Stairs-Rails: Left Entrance Stairs-Number of Steps: 1 (1, go through a door, 1, go through a second door)   Home Equipment: Other (comment) Additional Comments: hemi walker    Prior Function Level of Independence: Independent         Comments: Spouse has assisted occasionally with socks/shoes     Hand Dominance   Dominant Hand: Right    Extremity/Trunk Assessment   Upper Extremity Assessment Upper Extremity Assessment: Defer to OT evaluation    Lower Extremity Assessment Lower Extremity Assessment: Generalized weakness (History of parkinson's)    Cervical / Trunk Assessment Cervical / Trunk Assessment: Other exceptions Cervical / Trunk Exceptions: s/p surgery, gross trunk flexion  Communication   Communication: No difficulties  Cognition Arousal/Alertness: Awake/alert Behavior During Therapy: WFL for tasks assessed/performed Overall Cognitive Status: Within Functional Limits for tasks assessed                                        General Comments      Exercises     Assessment/Plan    PT Assessment Patient needs continued PT services  PT Problem List Decreased strength;Decreased activity tolerance;Decreased balance;Decreased mobility;Decreased knowledge of use of DME;Decreased safety awareness;Decreased knowledge of precautions;Pain       PT Treatment Interventions DME instruction;Gait training;Functional mobility training;Stair training;Therapeutic activities;Therapeutic exercise;Neuromuscular re-education;Patient/family education    PT Goals (Current goals can be found in the Care Plan section)  Acute Rehab PT Goals Patient Stated Goal: None stated during session PT Goal Formulation: With patient/family Time For Goal Achievement: 10/19/20 Potential to Achieve Goals: Good    Frequency Min 5X/week   Barriers to discharge         Co-evaluation               AM-PAC PT "6 Clicks" Mobility  Outcome Measure Help needed turning from your back to your side while in a flat bed without using bedrails?: None Help needed moving from lying on your back to sitting on the side of a flat bed without using bedrails?: A Little Help needed moving to and from a bed to a chair (including a wheelchair)?: A Little Help needed standing up from a chair using your arms (e.g., wheelchair or bedside chair)?: A Little Help needed to walk in hospital room?: A Little Help needed climbing 3-5 steps with a railing? : A Little 6 Click Score: 19    End of Session Equipment Utilized During Treatment: Gait belt Activity Tolerance: Patient tolerated treatment well Patient left: in bed;with call bell/phone within reach;with family/visitor present Nurse Communication: Mobility status PT Visit Diagnosis: Unsteadiness on feet (R26.81);Pain Pain - part of body:  (back)    Time: 8938-1017 PT Time Calculation (min) (ACUTE ONLY): 29 min   Charges:   PT Evaluation $PT Eval Low Complexity: 1 Low PT Treatments $Gait Training: 8-22 mins        Rolinda Roan, PT, DPT Acute Rehabilitation Services Pager: [phone] Office: [phone]   Thelma Comp 10/12/2020, 11:08 AM

## 2020-10-12 NOTE — Anesthesia Postprocedure Evaluation (Signed)
Anesthesia Post Note  Patient: Thomas [name]  Procedure(s) Performed: Left Lumbar Three-Four, Lumbar Fou-Five  Microdiscectomy (Left: Spine Lumbar)     Patient location during evaluation: PACU Anesthesia Type: General Level of consciousness: awake and alert Pain management: pain level controlled Vital Signs Assessment: post-procedure vital signs reviewed and stable Respiratory status: spontaneous breathing, nonlabored ventilation, respiratory function stable and patient connected to nasal cannula oxygen Cardiovascular status: blood pressure returned to baseline and stable Postop Assessment: no apparent nausea or vomiting Anesthetic complications: no   No notable events documented.  Last Vitals:  Vitals:   10/12/20 0425 10/12/20 0751  BP: (!) 155/81 124/70  Pulse: (!) 58 63  Resp: 18 18  Temp: 36.5 C 36.7 C  SpO2: 97% 97%    Last Pain:  Vitals:   10/12/20 0751  TempSrc: Oral  PainSc:                  Tiajuana Amass

## 2020-10-12 NOTE — Discharge Summary (Signed)
Physician Discharge Summary  Patient ID: Thomas [name] MRN: [id number] DOB/AGE: 71-May-1952 71 y.o.  Admit date: 10/11/2020 Discharge date: 10/12/2020  Admission Diagnoses: Herniated nucleus pulposus, Lumbar L 34 left and L 45 left with radiculopathy, stenosis, lumbago  Discharge Diagnoses: Herniated nucleus pulposus, Lumbar L 34 left and L 45 left with radiculopathy, stenosis, lumbago Active Problems:   Herniated lumbar disc without myelopathy   Discharged Condition: good  Hospital Course: The patient was admitted on 10/11/2020 and taken to the operating room where the patient underwent Left Lumbar 3-4, Lumbar 4-5 Microdiscectomy. The patient tolerated the procedure well and was taken to the recovery room and then to the floor in stable condition. The hospital course was routine. There were no complications. The wound remained clean dry and intact. Pt had appropriate back soreness. No complaints of leg pain or new N/T/W. The patient remained afebrile with stable vital signs, and tolerated a regular diet. The patient continued to increase activities, and pain was well controlled with oral pain medications.   Consults: None  Significant Diagnostic Studies: radiology: X-Ray: intraoperative   Treatments: surgery: Left Lumbar 3-4, Lumbar 4-5 Microdiscectomy   Discharge Exam: Blood pressure 124/70, pulse 63, temperature 98.1 F (36.7 C), temperature source Oral, resp. rate 18, height 5\' 10"  (1.778 m), weight 102.1 kg, SpO2 97 %. Physical Exam: Patient is awake, A/O X 4, conversant, and in good spirits. Speech is fluent and appropriate. Doing well. MAEW with good strength that is symmetric bilaterally. 5/5 BUE/BLE.   Dressing is clean dry intact. Incision is well approximated with no drainage, erythema, or edema.    Disposition: Discharge disposition: 01-Home or Self Care        Allergies as of 10/12/2020       Reactions   Codeine Other (See Comments)   Hallucinations   Other Nausea  Only   General anesthesia         Medication List     TAKE these medications    acetaminophen 500 MG tablet Commonly known as: TYLENOL Take 1,000 mg by mouth daily.   calcium carbonate 500 MG chewable tablet Commonly known as: TUMS - dosed in mg elemental calcium Chew 1 tablet by mouth daily as needed for indigestion or heartburn.   cetirizine 10 MG tablet Commonly known as: ZYRTEC Take 10 mg by mouth at bedtime.   cholecalciferol 25 MCG (1000 UNIT) tablet Commonly known as: VITAMIN D3 Take 1,000 Units by mouth daily.   enoxaparin 40 MG/0.4ML injection Commonly known as: LOVENOX Inject 0.4 mLs (40 mg total) into the skin daily for 14 days.   HYDROcodone-acetaminophen 5-325 MG tablet Commonly known as: NORCO/VICODIN Take 0.5-1 tablets by mouth every 4 (four) hours as needed for moderate pain.   melatonin 5 MG Tabs Take 5 mg by mouth at bedtime.   methocarbamol 500 MG tablet Commonly known as: ROBAXIN Take 1 tablet (500 mg total) by mouth every 6 (six) hours as needed for muscle spasms.   multivitamin tablet Take 1 tablet by mouth daily at 6 PM.   Pen Needles 30G X 5 MM Misc 1 each by Does not apply route daily.   PreviDent 5000 Booster Plus 1.1 % Pste Generic drug: Sodium Fluoride Place 1 application onto teeth 2 (two) times daily.   Systane Ultra 0.4-0.3 % Soln Generic drug: Polyethyl Glycol-Propyl Glycol Place 1 drop into both eyes daily as needed (dry eyes).   traMADol 50 MG tablet Commonly known as: ULTRAM Take 50 mg by mouth at bedtime.  ASK your doctor about these medications    amantadine 100 MG capsule Commonly known as: SYMMETREL TAKE 1 CAPSULE THREE TIMES DAILY   amLODipine 2.5 MG tablet Commonly known as: NORVASC Take 0.5 tablets (1.25 mg total) by mouth daily as needed. Take on days where BP>160   carbidopa-levodopa 25-100 MG tablet Commonly known as: SINEMET IR TAKE 2 TABLETS AT 6AM, 10AM, AND 2PM, AND TAKE 1 TABLET AT 6PM AS  DIRECTED. CAN TAKE EXTRA 1/2 TABLET-1 TABLET AS NEEDED.   carbidopa-levodopa 50-200 MG tablet Commonly known as: SINEMET CR TAKE 1 TABLET AT BEDTIME   diclofenac Sodium 1 % Gel Commonly known as: VOLTAREN APPLY TOPICALLY 4 GRAMS FOUR TIMES DAILY   dutasteride 0.5 MG capsule Commonly known as: Avodart Take 1 capsule (0.5 mg total) by mouth daily.   hydrALAZINE 25 MG tablet Commonly known as: APRESOLINE Take 1 tablet (25 mg total) by mouth 3 (three) times daily as needed (for SBP >160).   lisinopril 30 MG tablet Commonly known as: ZESTRIL Take 1 tablet (30 mg total) by mouth daily.   metoprolol succinate 25 MG 24 hr tablet Commonly known as: TOPROL-XL TAKE 1 TABLET (25 MG TOTAL) BY MOUTH DAILY.   polyethylene glycol powder 17 GM/SCOOP powder Commonly known as: GLYCOLAX/MIRALAX Take 17 g by mouth 2 (two) times daily as needed.   rOPINIRole 2 MG tablet Commonly known as: REQUIP TAKE 1 TABLET THREE TIMES DAILY   simvastatin 40 MG tablet Commonly known as: ZOCOR Take 1 tablet (40 mg total) by mouth daily.   tamsulosin 0.4 MG Caps capsule Commonly known as: FLOMAX Take 1 capsule (0.4 mg total) by mouth daily.   traZODone 50 MG tablet Commonly known as: DESYREL TAKE 1/2 TO 1 TABLET AT BEDTIME AS NEEDED FOR SLEEP   Xarelto 20 MG Tabs tablet Generic drug: rivaroxaban TAKE 1 TABLET (20 MG TOTAL) BY MOUTH DAILY.         Signed: Marvis Moeller, DNP, NP-C 10/12/2020, 8:39 AM

## 2020-10-12 NOTE — Plan of Care (Signed)
Adequately Ready for discharge

## 2020-10-12 NOTE — Discharge Instructions (Addendum)
Wound Care Remove outer dressing in 2-3 days Leave incision open to air. You may shower. Do not scrub directly on incision.  Do not put any creams, lotions, or ointments on incision. Activity Walk each and every day, increasing distance each day. No lifting greater than 5 lbs.  Avoid bending, arching, and twisting. No driving for 2 weeks; may ride as a passenger locally.  Diet Resume your normal diet.  Return to Work Will be discussed at you follow up appointment. Call Your Doctor If Any of These Occur Redness, drainage, or swelling at the wound.  Temperature greater than 101 degrees. Severe pain not relieved by pain medication. Incision starts to come apart. Follow Up Appt Call today for appointment in 2 weeks ([phone]) or for problems.

## 2020-10-12 NOTE — Evaluation (Signed)
Occupational Therapy Evaluation Patient Details Name: Thomas [name] MRN: [id number] DOB: Feb 01, 1951 Today's Date: 10/12/2020    History of Present Illness 70 yo M diagnosed with a herniated lumbar discs L3-4 and L4-5 on the left with left-sided nerve root compression.  PMH includes: Parkinson's disease and a history of hypercoagulability and blood clot with pulmonary embolism.  He is s/p left L3-4 and left L4-5 microdiskectomy on 6/30.   Clinical Impression   Patient admitted for the diagnosis and procedure above.  PTA he lives with his spouse, who can provide assist as needed.  Barriers are listed below.  Currently he is needing up to supervision to [facility] for basic mobility, and up to [place] a for lower body ADL from a sit/stand level.  Hip kit reviewed, precautions reviewed, and all questions answered.  No further acute OT needs identified.      Follow Up Recommendations  No OT follow up    Equipment Recommendations  Other (comment) ([id number])    Recommendations for Other Services       Precautions / Restrictions Precautions Precautions: Back Precaution Booklet Issued: Yes (comment) Precaution Comments: per PT Restrictions Weight Bearing Restrictions: No      Mobility Bed Mobility Overal bed mobility: Needs Assistance Bed Mobility: Supine to Sit;Sit to Supine     Supine to sit: Supervision Sit to supine: Supervision     Patient Response: Cooperative  Transfers Overall transfer level: Needs assistance Equipment used: Rolling walker (2 wheeled) Transfers: Sit to/from [facility] Sit to Stand: Min guard Stand pivot transfers: Supervision            Balance Overall balance assessment: Needs assistance Sitting-balance support: Feet supported;No upper extremity supported Sitting balance-Leahy Scale: Fair   Postural control: Posterior lean Standing balance support: Bilateral upper extremity supported Standing balance-Leahy Scale: Poor Standing  balance comment: recommending RW                           ADL either performed or assessed with clinical judgement   ADL Overall ADL's : At baseline                                       General ADL Comments: patient able to complete ADL from sit/stand level     Vision Baseline Vision/History: Wears glasses Patient Visual Report: No change from baseline       Perception     Praxis      Pertinent Vitals/Pain Pain Assessment: Faces Faces Pain Scale: Hurts a little bit Pain Location: incisional Pain Descriptors / Indicators: Tender Pain Intervention(s): Monitored during session     Hand Dominance Right   Extremity/Trunk Assessment Upper Extremity Assessment Upper Extremity Assessment: Generalized weakness   Lower Extremity Assessment Lower Extremity Assessment: Defer to PT evaluation   Cervical / Trunk Assessment Cervical / Trunk Assessment: Other exceptions Cervical / Trunk Exceptions: back surgery   Communication Communication Communication: No difficulties   Cognition Arousal/Alertness: Awake/alert Behavior During Therapy: WFL for tasks assessed/performed Overall Cognitive Status: Within Functional Limits for tasks assessed                                      Home Living Family/patient expects to be discharged to:: Private residence Living Arrangements: Spouse/significant other Available Help at Discharge:  Family;Available 24 hours/day Type of Home: House             Bathroom Shower/Tub: Walk-in [employer] treasurer Toilet: Handicapped height Bathroom Accessibility: Yes How Accessible: Accessible via walker Home Equipment: Other (comment)   Additional Comments: hemi walker      Prior Functioning/Environment Level of Independence: Independent        Comments: Spouse has assisted occasionally with socks/shoes        OT Problem List: Decreased strength;Impaired balance (sitting and/or  standing);Pain      OT Treatment/Interventions:      OT Goals(Current goals can be found in the care plan section) Acute Rehab OT Goals 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 OT Goal Formulation: With patient Time For Goal Achievement: 10/12/20 Potential to Achieve Goals: Good  OT Frequency:     Barriers to D/C:  None noted          Co-evaluation              AM-PAC OT "6 Clicks" Daily Activity     Outcome Measure Help from another person eating meals?: None Help from another person taking care of personal grooming?: None Help from another person toileting, which includes using toliet, bedpan, or urinal?: None Help from another person bathing (including washing, rinsing, drying)?: A Little Help from another person to put on and taking off regular upper body clothing?: None Help from another person to put on and taking off regular lower body clothing?: A Little 6 Click Score: 22   End of Session Equipment Utilized During Treatment: Rolling walker Nurse Communication: Mobility status  Activity Tolerance: Patient tolerated treatment well Patient left: in bed;with family/visitor present  OT Visit Diagnosis: Unsteadiness on feet (R26.81);Muscle weakness (generalized) (M62.81);Pain Pain - Right/Left:  (back)                Time: 7639-4320 OT Time Calculation (min): 18 min Charges:  OT General Charges $OT Visit: 1 Visit OT Evaluation $OT Eval Moderate Complexity: 1 Mod  10/12/2020  Rich, OTR/L  Acute Rehabilitation Services  Office:  [phone]   Metta Clines 10/12/2020, 10:24 AM

## 2020-10-16 ENCOUNTER — Ambulatory Visit: Payer: Medicare HMO | Admitting: Family Medicine

## 2020-10-16 ENCOUNTER — Telehealth: Payer: Self-pay | Admitting: General Practice

## 2020-10-16 ENCOUNTER — Telehealth: Payer: Self-pay

## 2020-10-16 NOTE — Telephone Encounter (Signed)
It is likely from the pain medicine. As he gets off the pain medicine it will likely get better. I would advise him to make sure his surgeon is aware.

## 2020-10-16 NOTE — Telephone Encounter (Cosign Needed)
  Care Management   Follow Up Note   10/16/2020 Name: Thomas [name] MRN: [id number] DOB: 1951/01/06   Referred by: [name], DO Reason for referral : Chronic Care Management (RNCM: The patients wife called and ask for help with a shower chair and a lift chair. Will send message to pcp and clinical staff with patients wife request for the patient. )   The patients wife called and left a voicemail that the patient had surgery and is home and needs a lift chair and a shower chair. She will send an email for the shower chair as she is limited on space. Secure text message sent to the patients wife instructing her that the pcp and clinical staff would be notified for assistance with DME.    Follow Up Plan: No further follow up required: will be available as needed for the patients needs.   Noreene Larsson RN, MSN, [facility] Mobile: [phone]

## 2020-10-16 NOTE — Telephone Encounter (Signed)
Ready for signature

## 2020-10-16 NOTE — Telephone Encounter (Signed)
Copied from [place] [phone]. Topic: General - Inquiry >> Oct 16, 2020 11:21 AM Thomas [name] D wrote: Reason for CRM: Pt's wife called saying since husbands surgery his bladder control has been worse.  They want to know what Dr. [name] would advise him to do.  He is already taking a medication for this.  CB#  [phone] or [phone]

## 2020-10-16 NOTE — Telephone Encounter (Signed)
Patients wife notified

## 2020-10-16 NOTE — Telephone Encounter (Signed)
Please write Rx and I will sign.  This is not high priority. High priority removed.

## 2020-10-17 ENCOUNTER — Telehealth: Payer: Self-pay | Admitting: Neurology

## 2020-10-17 MED ORDER — CARBIDOPA-LEVODOPA 25-100 MG PO TABS: 2.0000 | ORAL_TABLET | Freq: Four times a day (QID) | ORAL | 0 refills | Status: DC

## 2020-10-17 NOTE — Telephone Encounter (Signed)
Refill sent as requested. 

## 2020-10-17 NOTE — Telephone Encounter (Signed)
Pt wife LM, she needs Tat to approve the authorization for carbidopa/levo 25/100. He only has a 10 day supply and it will take 5/7 days to ship to him. [employer] phar mail order

## 2020-10-19 DIAGNOSIS — R69 Illness, unspecified: Secondary | ICD-10-CM | POA: Diagnosis not present

## 2020-10-23 ENCOUNTER — Telehealth: Payer: Self-pay

## 2020-10-23 NOTE — Telephone Encounter (Signed)
Copied from [place] [id number]. Topic: General - Other >> Oct 23, 2020  1:13 PM Celene Kras wrote: Reason for CRM: Steward Ros, from [facility], calling stating that the pt is needing a referral for the authorization due to the DME company not accepting his insurance. She states that the pt is in need of a powerlift wheelchair and bath chair. She is requesting to have someone reach back out to her for help. Please advise.

## 2020-10-23 NOTE — Telephone Encounter (Signed)
What kind of referral is [employer] looking for? Looks like when you called them there was nothing else we needed.  Can we call the DME company to find out what they are looking for?

## 2020-10-23 NOTE — Telephone Encounter (Signed)
Per patient wife Thomas [name] needs a referral from provider for powerlift wheelchair and bath chair in order to proceed. Patient wife states [employer] did not accept prescriptions that were faxed over.

## 2020-10-23 NOTE — Telephone Encounter (Signed)
Returned call to [place], per Beverlee Nims she does not see where an authorization is needed. She was not aware of what is needed. Will attempt to call patient and see if anything is needed.

## 2020-10-25 NOTE — Telephone Encounter (Signed)
Per Mcarthur Rossetti, authorization needs to be initiated for DME reimbursement. DME company NPI / Tax ID #, patient diagnosis code, and hcpcs code for equipment, and store address is needed for authorization to begin. The company patient is trying to get power lift chair and bath chair is a retail store that does not accept insurance, patient would like to be reimbursed for DME from insurance when purchased. Per insurance authorization needs to be initiated to be sure reimbursement is possible. Is this something we do or the patient assistance team?

## 2020-10-25 NOTE — Telephone Encounter (Signed)
Power lift chair or wheelchair?  Wheelchair will require a PT evaluation and is NOT easy to get. We can refer him to PT for evaluation, but please let them know that it is a months long process to get a power wheelchair.   Lift chair and shower chair usually just require a Rx, but may need an appointment- he can also see if his neurosurgeon can write for it-- they may accept his Rx.

## 2020-10-25 NOTE — Telephone Encounter (Signed)
Spoke to patient wife [name] and notified her of Dr.Johnson recommendations. Patient wife states she has reached out to [facility] as well and she is stating that keep requesting a referral. Per Estanislado Pandy states they do not require a referral for order. Patient wife was provided with the names of the [facility] representatives per request. Patient states they are scheduled with the Neurosurgeon's office Tuesday 10/30/20 and she will discuss it will them then. Christy verbalized understanding.

## 2020-10-29 NOTE — Progress Notes (Signed)
Assessment/Plan:    1.  Parkinsons Disease             -Continue ropinirole, 2 mg 3 times per day             -Continue carbidopa/levodopa 25/100, 2 tablets at 6 AM/10 AM/2 PM/6 PM             -Continue carbidopa/levodopa 50/200 CR at bedtime  -discussed exercise  -We discussed that it used to be thought that levodopa would increase risk of melanoma but now it is believed that Parkinsons itself likely increases risk of melanoma. he is to get regular skin checks.   2.  Parkinson's dyskinesia             -Continue amantadine, 100 mg 3 times per day.   3.  RBD             -Discussed safety.  Declines medication right now.   4.  MCI             -Neurocognitive test in August, 2020 without evidence of dementia.  Following.   5.  Neurogenic Orthostatic Hypotension             -likely due to BP meds in combination with Parkinsons Disease.    His PCP told him to only take amlodipine if his blood pressure is running greater than 160.  He is on hydralazine, lisinopril, metoprolol and tamsulosin which all are driving down blood pressure as well.  Doing well today but running low at home.  Sees PCP later this month.   6.    Lumbar spinal stenosis and neural foraminal stenosis             -status post surgical intervention with microdiscectomy at L3-L4 and L4-L5 on October 11, 2020 with Dr. Vertell Limber  7.  Sleep disturbance  -On trazodone, prescribed by primary care  -On melatonin, 5 mg nightly  Subjective:   Thomas [name] was seen today in follow up for Parkinsons disease.  My previous records were reviewed prior to todays visit as well as outside records available to me. Pt denies falls.  Pt denies lightheadedness, near syncope.  No hallucinations.  Patient just had surgery on October 11, 2020 with Dr. Vertell Limber.  He went underwent L3-4 and L4-5 microdiscectomy.  Surgery went well and he is pain free mostly (no longer with sciatic pain).  BP running on the low end.    Current prescribed movement  disorder medications:   Ropinirole, 2 mg 3 times per day Carbidopa/levodopa 25/100, 2 tablets at 6 AM/2 tablets at 10 AM/2 tablets at 2 PM/2 tablets at 6 PM Amantadine 100 mg 3 times per day carbidopa/levodopa 50/200 CR q hs   ALLERGIES:   Allergies  Allergen Reactions   Codeine Other (See Comments)    Hallucinations   Other Nausea Only    General anesthesia     CURRENT MEDICATIONS:  Outpatient Encounter Medications as of 10/30/2020  Medication Sig   acetaminophen (TYLENOL) 500 MG tablet Take 1,000 mg by mouth daily.   amantadine (SYMMETREL) 100 MG capsule TAKE 1 CAPSULE THREE TIMES DAILY (Patient taking differently: Take 100 mg by mouth 3 (three) times daily.)   amLODipine (NORVASC) 2.5 MG tablet Take 0.5 tablets (1.25 mg total) by mouth daily as needed. Take on days where BP>160 (Patient taking differently: Take 2.5 mg by mouth daily as needed (BP>160/90).)   calcium carbonate (TUMS - DOSED IN MG ELEMENTAL CALCIUM) 500 MG chewable tablet Chew  1 tablet by mouth daily as needed for indigestion or heartburn.   carbidopa-levodopa (SINEMET CR) 50-200 MG tablet TAKE 1 TABLET AT BEDTIME (Patient taking differently: Take 1 tablet by mouth.)   carbidopa-levodopa (SINEMET IR) 25-100 MG tablet Take 2 tablets by mouth 4 (four) times daily. TAKE 2 TABLETS AT 6AM, 10AM, AND 2PM, AND 6PM   cetirizine (ZYRTEC) 10 MG tablet Take 10 mg by mouth at bedtime.   cholecalciferol (VITAMIN D3) 25 MCG (1000 UNIT) tablet Take 1,000 Units by mouth daily.   diclofenac Sodium (VOLTAREN) 1 % GEL APPLY TOPICALLY 4 GRAMS FOUR TIMES DAILY (Patient taking differently: Apply 4 g topically 4 (four) times daily as needed (pain).)   dutasteride (AVODART) 0.5 MG capsule Take 1 capsule (0.5 mg total) by mouth daily. (Patient taking differently: Take 0.5 mg by mouth daily at 6 PM.)   hydrALAZINE (APRESOLINE) 25 MG tablet Take 1 tablet (25 mg total) by mouth 3 (three) times daily as needed (for SBP >160). (Patient taking  differently: Take 25 mg by mouth daily as needed (for SBP >160/90). In the evening as needed)   HYDROcodone-acetaminophen (NORCO/VICODIN) 5-325 MG tablet Take 0.5-1 tablets by mouth every 4 (four) hours as needed for moderate pain.   Insulin Pen Needle (PEN NEEDLES) 30G X 5 MM MISC 1 each by Does not apply route daily.   lisinopril (ZESTRIL) 30 MG tablet Take 1 tablet (30 mg total) by mouth daily. (Patient taking differently: Take 30 mg by mouth at bedtime.)   melatonin 5 MG TABS Take 5 mg by mouth at bedtime.   methocarbamol (ROBAXIN) 500 MG tablet Take 1 tablet (500 mg total) by mouth every 6 (six) hours as needed for muscle spasms.   metoprolol succinate (TOPROL-XL) 25 MG 24 hr tablet TAKE 1 TABLET (25 MG TOTAL) BY MOUTH DAILY. (Patient taking differently: Take 25 mg by mouth at bedtime.)   Multiple Vitamin (MULTIVITAMIN) tablet Take 1 tablet by mouth daily at 6 PM.   polyethylene glycol powder (GLYCOLAX/MIRALAX) 17 GM/SCOOP powder Take 17 g by mouth 2 (two) times daily as needed. (Patient taking differently: Take 17 g by mouth at bedtime.)   PREVIDENT 5000 BOOSTER PLUS 1.1 % PSTE Place 1 application onto teeth 2 (two) times daily.   rOPINIRole (REQUIP) 2 MG tablet TAKE 1 TABLET THREE TIMES DAILY (Patient taking differently: Take 2 mg by mouth 3 (three) times daily. 6 am, 10 am, and 2 pm)   simvastatin (ZOCOR) 40 MG tablet Take 1 tablet (40 mg total) by mouth daily. (Patient taking differently: Take 40 mg by mouth at bedtime.)   SYSTANE ULTRA 0.4-0.3 % SOLN Place 1 drop into both eyes daily as needed (dry eyes).   tamsulosin (FLOMAX) 0.4 MG CAPS capsule Take 1 capsule (0.4 mg total) by mouth daily. (Patient taking differently: Take 0.4 mg by mouth daily in the afternoon.)   traMADol (ULTRAM) 50 MG tablet Take 50 mg by mouth at bedtime.   traZODone (DESYREL) 50 MG tablet TAKE 1/2 TO 1 TABLET AT BEDTIME AS NEEDED FOR SLEEP (Patient taking differently: Take 50 mg by mouth at bedtime.)   XARELTO 20 MG  TABS tablet TAKE 1 TABLET (20 MG TOTAL) BY MOUTH DAILY. (Patient taking differently: Take 20 mg by mouth daily. 10 am)   [DISCONTINUED] enoxaparin (LOVENOX) 40 MG/0.4ML injection Inject 0.4 mLs (40 mg total) into the skin daily for 14 days. (Patient not taking: Reported on 10/30/2020)   No facility-administered encounter medications on file as of 10/30/2020.  Objective:   PHYSICAL EXAMINATION:    VITALS:   Vitals:   10/30/20 1259  BP: 132/76  Pulse: (!) 46  SpO2: 98%  Weight: 226 lb (102.5 kg)  Height: 5\' 10"  (1.778 m)    GEN:  The patient appears stated age and is in NAD. HEENT:  Normocephalic, atraumatic.  The mucous membranes are moist. The superficial temporal arteries are without ropiness or tenderness. CV:  brady Lungs:  CTAB Neck/HEME:  There are no carotid bruits bilaterally. Skin:  well healing incision in the back   Neurological examination:  Orientation: The patient is alert and oriented x3. Cranial nerves: There is good facial symmetry with facial hypomimia. The speech is fluent and clear. Soft palate rises symmetrically and there is no tongue deviation. Hearing is intact to conversational tone. Sensation: Sensation is intact to light touch throughout Motor: Strength is at least antigravity x4.  Movement examination: Tone: There is mild increased tone in the RUE Abnormal movements: mild truncal dyskinesia Coordination:  There is min decremation with RAM's, mostly with finger taps on the R Gait and Station: The patient has min difficulty arising out of a deep-seated chair without the use of the hands. The patient's stride length is good but he is flexed at the waist.    I have reviewed and interpreted the following labs independently    Chemistry      Component Value Date/Time   NA 140 09/18/2020 1439   K 4.0 09/18/2020 1439   CL 102 09/18/2020 1439   CO2 24 09/18/2020 1439   BUN 20 09/18/2020 1439   CREATININE 1.07 09/18/2020 1439      Component Value  Date/Time   CALCIUM 9.9 09/18/2020 1439   ALKPHOS 95 09/18/2020 1439   AST 12 09/18/2020 1439   ALT 6 09/18/2020 1439   BILITOT 0.6 09/18/2020 1439       Lab Results  Component Value Date   WBC 4.6 09/18/2020   HGB 13.6 09/18/2020   HCT 39.9 09/18/2020   MCV 91 09/18/2020   PLT 165 09/18/2020    Lab Results  Component Value Date   TSH 1.130 07/10/2020     Total time spent on today's visit was 20 minutes, including both face-to-face time and nonface-to-face time.  Time included that spent on review of records (prior notes available to me/labs/imaging if pertinent), discussing treatment and goals, answering patient's questions and coordinating care.  Cc:  [name], DO

## 2020-10-30 ENCOUNTER — Other Ambulatory Visit: Payer: Self-pay

## 2020-10-30 ENCOUNTER — Ambulatory Visit: Payer: Medicare HMO | Admitting: Neurology

## 2020-10-30 ENCOUNTER — Encounter: Payer: Self-pay | Admitting: Neurology

## 2020-10-30 VITALS — BP 132/76 | HR 46 | Ht 70.0 in | Wt 226.0 lb

## 2020-10-30 DIAGNOSIS — G20 Parkinson's disease: Secondary | ICD-10-CM

## 2020-10-30 DIAGNOSIS — G249 Dystonia, unspecified: Secondary | ICD-10-CM

## 2020-10-30 NOTE — Patient Instructions (Signed)
Online Resources for Power over Parkinson's Group   [employer] Online Groups  Power over Parkinson's Group :    Upcoming Power over Parkinson's Meetings:  2nd Wednesdays of the month at 2 pm:  June 8th, July 13th [place] at [email or web address] if interested in participating in this group Parkinson's Care Partners Group:    3rd Mondays, Contact Misty Paladino Atypical Parkinsonian Patient Group:   4th Wednesdays, Contact Misty Paladino If you are interested in participating in these groups with Misty, please contact her directly for how to join those meetings.  Her contact information is [email or web address].   [place]:  [email or web address] PD Health at Home continues:  Mindfulness Mondays, Expert Briefing Tuesdays, Wellness Wednesdays, Take Time Thursdays, Fitness Fridays Register for expert briefings (webinars) at [email or web address]  Please check out their website to sign up for emails and see their full online offerings  [place]:  [email or web address]  Check out additional information on their website to see their full online offerings  [facility]:  [email or web address] Upcoming Webinar:  Stay tuned Care Partner Monthly Meetup.  With [facility] [place].  First Tuesday of each month, 2 pm Joy Breaks:  First Wednesday of each month, 2-3 pm. There will be art, doodling, making, crafting, listening, laughing, stories, and everything in between. No art experience necessary. No supplies required. Just show up for joy!  Register on their website. Check out additional information to Live Well Today on their website  Parkinson and Movement Disorders (PMD) Alliance:  www.[email or web address] [employer] Online:  Online Education Events Sign up for emails, which are sent weekly to give you updates on programming and online offerings     Parkinson's Association of the [place]:   www.[email or web address] Information on online support groups, education events, and online exercises including Yoga, Parkinson's exercises and more-LOTS of information on links to PD resources and online events Virtual Support Group through Parkinson's Association of the [place]; next one is scheduled for Wednesday, May 4th, 2022 at 2 pm. (These are typically scheduled for the 1st Wednesday of the month at 2 pm).  Visit website for details.  Additional links for movement activities: PWR! Moves Classes at [place] RESUMED!  Wednesdays 10 and 11 am.  Contact Amy Marriott, PT [email or web address] or [phone] if interested Here is a link to the PWR!Moves classes on Zoom from [phone] - Daily Mon-Sat at 10:00. Via Zoom, FREE and open to all.  There is also a link below via Facebook if you use that platform. [place] [email or web address] Parkinson's Wellness Recovery (PWR! Moves)  www.[email or web address] Info on the PWR! Virtual Experience:  You will have access to our expertise through self-assessment, guided plans that start with the PD-specific fundamentals, educational content, tips, Q&A with an expert, and a growing PrepaidParty.no of PD-specific pre-recorded and live exercise classes of varying types and intensity - both physical and cognitive! If that is not enough, we offer 1:1 wellness consultations (in-person or virtual) to personalize your PWR! Art therapist.  Check out the PWR! Move of the month on the [place] Recovery website:  [address] [email or web address] Fridays:  As part of the PD Health @ Home program, this free video series focuses each week on one aspect of fitness designed to support  people living with Parkinson's.  These weekly videos highlight the [place] recent fitness guidelines for people with Parkinson's disease.  [address] Dance for PD website is offering free, live-stream classes throughout the week, as well as links to  Arts administrator of classes:  [email or web address] Dance for Parkinson's Class:  [place].  Free offering for people with Parkinson's and care partners; virtual class.  For more information, contact [phone] or email Ruffin Frederick at [email or web address] Virtual dance and Pilates for Parkinson's classes: Click on the Community Tab> Parkinson's Movement Initiative Tab.  To register for classes and for more information, visit [email or web address] and click the "community" tab.     [employer] Parkinson's Cycling Classes  [employer]: 1pm on Fridays-Live classes at [employer] ([employer] at [email or web address] or [phone]) [employer]: Virtual Classes Mondays and Thursdays Jeanette Caprice classes Tuesday, Wednesday and Thursday (contact [place] at [email or web address]  or [phone])  [facility] Boxing Three levels of classes are offered Tuesdays and Thursdays:  10:30 am,  12 noon & 1:45 pm at [facility].  Active Stretching with Paula Compton Class starting in March, on Fridays To observe a class or for  more information, call [phone] or email [email or web address] Well[employer]Spring [employer]: Online Caregiver Education Opportunities:  www.well-[email or web address]/caregiver-education/caregiver-support-group.  You may also contact Vickki Muff at [email or web address] or [phone].   Well-Spring Navigator:  [phone] program, a free service to help individuals and families through the journey of determining care for older adults.  The "Navigator" is a  [employer], Education officer, museum, who will speak with a prospective client and/or loved ones to provide an assessment of the situation and a set of recommendations for a personalized care plan -- all free of charge, and whether Well-Spring Solutions offers the needed service or not. If the need is not a service we provide, we are well-connected with reputable programs in town that we can refer you to.  www.well-springsolutions.org or to speak with the Navigator, call [phone].

## 2020-11-01 ENCOUNTER — Other Ambulatory Visit: Payer: Self-pay | Admitting: Neurology

## 2020-11-01 DIAGNOSIS — G20 Parkinson's disease: Secondary | ICD-10-CM

## 2020-11-05 ENCOUNTER — Other Ambulatory Visit: Payer: Self-pay | Admitting: Neurology

## 2020-11-07 ENCOUNTER — Telehealth: Payer: Self-pay | Admitting: Family Medicine

## 2020-11-07 NOTE — Telephone Encounter (Signed)
Mr Thomas [name], called to give DR. [name] a number to call in case the lift needed approval after being denied under insurance / ask for an authorization for exception due to it being medically neccessary / the number to call is [phone] - [employer]

## 2020-11-07 NOTE — Telephone Encounter (Signed)
Please assist

## 2020-11-09 ENCOUNTER — Ambulatory Visit (INDEPENDENT_AMBULATORY_CARE_PROVIDER_SITE_OTHER): Payer: Medicare HMO | Admitting: Family Medicine

## 2020-11-09 ENCOUNTER — Other Ambulatory Visit: Payer: Self-pay

## 2020-11-09 ENCOUNTER — Encounter: Payer: Self-pay | Admitting: Family Medicine

## 2020-11-09 VITALS — BP 104/61 | HR 56 | Temp 98.1°F | Wt 227.1 lb

## 2020-11-09 DIAGNOSIS — I10 Essential (primary) hypertension: Secondary | ICD-10-CM

## 2020-11-09 DIAGNOSIS — E7800 Pure hypercholesterolemia, unspecified: Secondary | ICD-10-CM

## 2020-11-09 DIAGNOSIS — Z1283 Encounter for screening for malignant neoplasm of skin: Secondary | ICD-10-CM | POA: Diagnosis not present

## 2020-11-09 DIAGNOSIS — N401 Enlarged prostate with lower urinary tract symptoms: Secondary | ICD-10-CM

## 2020-11-09 DIAGNOSIS — R350 Frequency of micturition: Secondary | ICD-10-CM

## 2020-11-09 MED ORDER — DUTASTERIDE 0.5 MG PO CAPS: 0.5000 mg | ORAL_CAPSULE | Freq: Every day | ORAL | 1 refills | Status: DC

## 2020-11-09 MED ORDER — RIVAROXABAN 20 MG PO TABS: 20.0000 mg | ORAL_TABLET | Freq: Every day | ORAL | 3 refills | Status: DC

## 2020-11-09 NOTE — Telephone Encounter (Signed)
RX was sent to [facility] but needs to go to [facility]. Can we resend please?

## 2020-11-09 NOTE — Progress Notes (Signed)
BP 104/61   Pulse (!) 56   Temp 98.1 F (36.7 C)   Wt 227 lb 2 oz (103 kg)   SpO2 97%   BMI 32.59 kg/m    Subjective:    Patient ID: Thomas [name], male    DOB: 1950-12-31, 70 y.o.   MRN: [id number]  HPI: Thomas [name] is a 70 y.o. male  Chief Complaint  Patient presents with   Hypertension    111-130's, so he is not taking hydralazine very often   Hyperlipidemia   Benign Prostatic Hypertrophy    Needs a refill on avodart   Referral    Needs a referral to derm, neurologist recommends due to higher risk of skin cancer    appeal     Patient had to get a lift chair, due to parkinson's disease needs to have an appeal to try to get some reimbursement   FYI    Needs a walking stick   Has been working on getting his lift chair and shower chair approved with his insurance.   HYPERTENSION / HYPERLIPIDEMIA Satisfied with current treatment? yes Duration of hypertension: chronic BP monitoring frequency: a few times a day BP medication side effects: no Duration of hyperlipidemia: chronic Cholesterol medication side effects: no Cholesterol supplements: none Past cholesterol medications: simvastatin Medication compliance: excellent compliance Aspirin: no Recent stressors: yes Recurrent headaches: no Visual changes: no Palpitations: no Dyspnea: no Chest pain: no Lower extremity edema: no Dizzy/lightheaded: no  BPH BPH status: better Satisfied with current treatment?: yes Medication side effects: no Medication compliance: excellent compliance Duration: chronic Nocturia: 1/night Urinary frequency:no Incomplete voiding: no Urgency: no Weak urinary stream: no Straining to start stream: no Dysuria: no Onset: gradual Severity: mild   Relevant past medical, surgical, family and social history reviewed and updated as indicated. Interim medical history since our last visit reviewed. Allergies and medications reviewed and updated.  Review of Systems  Constitutional:  Negative.   Respiratory: Negative.    Cardiovascular: Negative.   Gastrointestinal: Negative.   Musculoskeletal:  Positive for myalgias. Negative for arthralgias, back pain, gait problem, joint swelling, neck pain and neck stiffness.  Neurological:  Positive for tremors. Negative for dizziness, seizures, syncope, facial asymmetry, speech difficulty, weakness, light-headedness, numbness and headaches.  Psychiatric/Behavioral: Negative.     Per HPI unless specifically indicated above     Objective:    BP 104/61   Pulse (!) 56   Temp 98.1 F (36.7 C)   Wt 227 lb 2 oz (103 kg)   SpO2 97%   BMI 32.59 kg/m   Wt Readings from Last 3 Encounters:  11/09/20 227 lb 2 oz (103 kg)  10/30/20 226 lb (102.5 kg)  10/11/20 225 lb (102.1 kg)    Physical Exam Vitals and nursing note reviewed.  Constitutional:      General: He is not in acute distress.    Appearance: Normal appearance. He is not ill-appearing, toxic-appearing or diaphoretic.  HENT:     Head: Normocephalic and atraumatic.     Right Ear: External ear normal.     Left Ear: External ear normal.     Nose: Nose normal.     Mouth/Throat:     Mouth: Mucous membranes are moist.     Pharynx: Oropharynx is clear.  Eyes:     General: No scleral icterus.       Right eye: No discharge.        Left eye: No discharge.     Extraocular Movements: Extraocular movements  intact.     Conjunctiva/sclera: Conjunctivae normal.     Pupils: Pupils are equal, round, and reactive to light.  Cardiovascular:     Rate and Rhythm: Normal rate and regular rhythm.     Pulses: Normal pulses.     Heart sounds: Normal heart sounds. No murmur heard.   No friction rub. No gallop.  Pulmonary:     Effort: Pulmonary effort is normal. No respiratory distress.     Breath sounds: Normal breath sounds. No stridor. No wheezing, rhonchi or rales.  Chest:     Chest wall: No tenderness.  Musculoskeletal:        General: Normal range of motion.     Cervical back:  Normal range of motion and neck supple.  Skin:    General: Skin is warm and dry.     Capillary Refill: Capillary refill takes less than 2 seconds.     Coloration: Skin is not jaundiced or pale.     Findings: No bruising, erythema, lesion or rash.  Neurological:     General: No focal deficit present.     Mental Status: He is alert and oriented to person, place, and time. Mental status is at baseline.  Psychiatric:        Mood and Affect: Mood normal.        Behavior: Behavior normal.        Thought Content: Thought content normal.        Judgment: Judgment normal.    Results for orders placed or performed during the hospital encounter of 10/08/20  Surgical pcr screen   Specimen: Nasal Mucosa; Nasal Swab  Result Value Ref Range   MRSA, PCR NEGATIVE NEGATIVE   Staphylococcus aureus POSITIVE (A) NEGATIVE  SARS CORONAVIRUS 2 (TAT 6-24 HRS) Nasopharyngeal Nasopharyngeal Swab   Specimen: Nasopharyngeal Swab  Result Value Ref Range   SARS Coronavirus 2 NEGATIVE NEGATIVE      Assessment & Plan:   Problem List Items Addressed This Visit       Cardiovascular and Mediastinum   Hypertension    Under good control on current regimen. Continue current regimen. Continue to monitor. Call with any concerns. Refills given.         Relevant Medications   rivaroxaban (XARELTO) 20 MG TABS tablet     Genitourinary   BPH (benign prostatic hyperplasia) - Primary    Under good control on current regimen. Continue current regimen. Continue to monitor. Call with any concerns. Refills given.         Relevant Medications   dutasteride (AVODART) 0.5 MG capsule     Other   Hypercholesterolemia    Under good control on current regimen. Continue current regimen. Continue to monitor. Call with any concerns. Refills given.         Relevant Medications   rivaroxaban (XARELTO) 20 MG TABS tablet   Other Visit Diagnoses     Screening for skin cancer       Referral to dermatology made today.    Relevant Orders   Ambulatory referral to Dermatology        Follow up plan: Return in about 6 months (around 05/12/2021) for physical.

## 2020-11-11 ENCOUNTER — Encounter: Payer: Self-pay | Admitting: Family Medicine

## 2020-11-11 NOTE — Assessment & Plan Note (Signed)
Under good control on current regimen. Continue current regimen. Continue to monitor. Call with any concerns. Refills given.   

## 2020-11-12 NOTE — Telephone Encounter (Signed)
Left detailed message for Coralee North Independent Broker. Called regarding to patient to gather more information from prior message.

## 2020-11-12 NOTE — Telephone Encounter (Signed)
Left message for Coralee North to give our office a call back to discuss patient.

## 2020-11-13 MED ORDER — DUTASTERIDE 0.5 MG PO CAPS: 0.5000 mg | ORAL_CAPSULE | Freq: Every day | ORAL | 0 refills | Status: DC

## 2020-11-13 NOTE — Telephone Encounter (Signed)
Pts wife called in stating pt really needs this medication sent over to the [facility], he is completely out of the medication, please advise.

## 2020-11-13 NOTE — Addendum Note (Signed)
Addended by: Georgina Peer on: 11/13/2020 02:27 PM   Modules accepted: Orders

## 2020-11-13 NOTE — Telephone Encounter (Signed)
Patient notified of medication being sent in.  

## 2020-11-14 NOTE — Telephone Encounter (Signed)
[name] called back to make sure that Dr. [name] got the 800 number her provided. He also stated that Dr. [name] has to get this chair approved through the office as he can not approve chair.

## 2020-11-15 ENCOUNTER — Other Ambulatory Visit: Payer: Self-pay | Admitting: Family Medicine

## 2020-11-15 NOTE — Telephone Encounter (Signed)
Please call the number and give information required. Let me know if there is anything they need

## 2020-11-15 NOTE — Telephone Encounter (Signed)
Last ordered 11/13/20 #30 tabs no refills. So this request has been taken care of by other. Refusing request at this time

## 2020-11-20 ENCOUNTER — Ambulatory Visit: Payer: Medicare HMO | Admitting: Neurology

## 2020-11-23 DIAGNOSIS — M5416 Radiculopathy, lumbar region: Secondary | ICD-10-CM | POA: Diagnosis not present

## 2020-11-26 ENCOUNTER — Other Ambulatory Visit: Payer: Self-pay | Admitting: Neurology

## 2020-11-30 ENCOUNTER — Telehealth: Payer: Medicare HMO

## 2020-11-30 DIAGNOSIS — Z1283 Encounter for screening for malignant neoplasm of skin: Secondary | ICD-10-CM

## 2020-11-30 NOTE — Telephone Encounter (Signed)
Copied from [place] [phone]. Topic: General - Other >> Nov 30, 2020  2:36 PM Valere Dross wrote: Reason for CRM: Pt wife called in stating they had trouble with the [facility] and wanted to see about getting another referral to a [facility]. Please advise.   Can referral be resent as requested please?

## 2020-11-30 NOTE — Telephone Encounter (Signed)
Called and spoke with patients wife, she got the denial letter from insurance for the chair. She said that we have done what we needed to do and that she will take care of what else needs to be done such as an appeal.

## 2020-12-01 ENCOUNTER — Other Ambulatory Visit: Payer: Self-pay | Admitting: Neurology

## 2020-12-01 ENCOUNTER — Other Ambulatory Visit: Payer: Self-pay | Admitting: Family Medicine

## 2020-12-01 NOTE — Telephone Encounter (Signed)
Requested medications are due for refill today yes  Requested medications are on the active medication list yes  Last refill 6/14 (mail order)  Last visit 11/09/20  Future visit scheduled 01/08/21  Notes to clinic When tried to approve this rx it brought up the question of being on both Trazodone and Ultram, please assess.

## 2020-12-03 NOTE — Telephone Encounter (Signed)
Patient last seen 11/09/20 and has appointment 01/08/21

## 2020-12-03 NOTE — Telephone Encounter (Signed)
Routing to provider for new referral per referral coordinator. Previous referral was closed.

## 2020-12-05 DIAGNOSIS — M79674 Pain in right toe(s): Secondary | ICD-10-CM | POA: Diagnosis not present

## 2020-12-05 DIAGNOSIS — M79675 Pain in left toe(s): Secondary | ICD-10-CM | POA: Diagnosis not present

## 2020-12-05 DIAGNOSIS — B351 Tinea unguium: Secondary | ICD-10-CM | POA: Diagnosis not present

## 2020-12-05 DIAGNOSIS — L600 Ingrowing nail: Secondary | ICD-10-CM | POA: Diagnosis not present

## 2020-12-10 DIAGNOSIS — M545 Low back pain, unspecified: Secondary | ICD-10-CM | POA: Diagnosis not present

## 2020-12-10 DIAGNOSIS — M4126 Other idiopathic scoliosis, lumbar region: Secondary | ICD-10-CM | POA: Diagnosis not present

## 2020-12-10 DIAGNOSIS — M5126 Other intervertebral disc displacement, lumbar region: Secondary | ICD-10-CM | POA: Diagnosis not present

## 2020-12-10 DIAGNOSIS — G20 Parkinson's disease: Secondary | ICD-10-CM | POA: Diagnosis not present

## 2020-12-10 DIAGNOSIS — I10 Essential (primary) hypertension: Secondary | ICD-10-CM | POA: Diagnosis not present

## 2020-12-10 DIAGNOSIS — M5416 Radiculopathy, lumbar region: Secondary | ICD-10-CM | POA: Diagnosis not present

## 2020-12-10 DIAGNOSIS — Z6832 Body mass index (BMI) 32.0-32.9, adult: Secondary | ICD-10-CM | POA: Diagnosis not present

## 2020-12-10 DIAGNOSIS — M4300 Spondylolysis, site unspecified: Secondary | ICD-10-CM | POA: Diagnosis not present

## 2020-12-14 DIAGNOSIS — M5126 Other intervertebral disc displacement, lumbar region: Secondary | ICD-10-CM | POA: Diagnosis not present

## 2020-12-19 ENCOUNTER — Other Ambulatory Visit: Payer: Self-pay

## 2020-12-20 ENCOUNTER — Other Ambulatory Visit: Payer: Self-pay | Admitting: Family Medicine

## 2020-12-20 MED ORDER — TAMSULOSIN HCL 0.4 MG PO CAPS: 0.4000 mg | ORAL_CAPSULE | Freq: Every day | ORAL | 1 refills | Status: DC

## 2020-12-20 NOTE — Telephone Encounter (Signed)
Copied from [place] [phone]. Topic: Quick Communication - Rx Refill/Question >> Dec 20, 2020  1:31 PM Leward Quan A wrote: Medication: simvastatin (ZOCOR) 40 MG tablet   Has the patient contacted their pharmacy? Yes.   (Agent: If no, request that the patient contact the pharmacy for the refill.) (Agent: If yes, when and what did the pharmacy advise?)  Preferred Pharmacy (with phone number or street name): [facility] ([facility]) - [place], [place]  Phone:  [phone] Fax:  [phone]     Agent: Please be advised that RX refills may take up to 3 business days. We ask that you follow-up with your pharmacy.

## 2020-12-21 MED ORDER — SIMVASTATIN 40 MG PO TABS: 40.0000 mg | ORAL_TABLET | Freq: Every day | ORAL | 1 refills | Status: DC

## 2020-12-21 NOTE — Telephone Encounter (Signed)
Called patient to verify if refill needed at this time. Pharmacy note states ON Hold until patient calls

## 2020-12-24 DIAGNOSIS — G20 Parkinson's disease: Secondary | ICD-10-CM | POA: Diagnosis not present

## 2020-12-24 DIAGNOSIS — R2689 Other abnormalities of gait and mobility: Secondary | ICD-10-CM | POA: Diagnosis not present

## 2020-12-24 DIAGNOSIS — M5459 Other low back pain: Secondary | ICD-10-CM | POA: Diagnosis not present

## 2020-12-24 DIAGNOSIS — R262 Difficulty in walking, not elsewhere classified: Secondary | ICD-10-CM | POA: Diagnosis not present

## 2020-12-24 DIAGNOSIS — R2681 Unsteadiness on feet: Secondary | ICD-10-CM | POA: Diagnosis not present

## 2020-12-26 ENCOUNTER — Other Ambulatory Visit: Payer: Self-pay

## 2020-12-26 MED ORDER — CARBIDOPA-LEVODOPA 25-100 MG PO TABS: 2.0000 | ORAL_TABLET | Freq: Four times a day (QID) | ORAL | 0 refills | Status: DC

## 2020-12-27 DIAGNOSIS — R2689 Other abnormalities of gait and mobility: Secondary | ICD-10-CM | POA: Diagnosis not present

## 2020-12-27 DIAGNOSIS — M5459 Other low back pain: Secondary | ICD-10-CM | POA: Diagnosis not present

## 2020-12-27 DIAGNOSIS — G20 Parkinson's disease: Secondary | ICD-10-CM | POA: Diagnosis not present

## 2020-12-27 DIAGNOSIS — R262 Difficulty in walking, not elsewhere classified: Secondary | ICD-10-CM | POA: Diagnosis not present

## 2020-12-27 DIAGNOSIS — R2681 Unsteadiness on feet: Secondary | ICD-10-CM | POA: Diagnosis not present

## 2021-01-01 ENCOUNTER — Other Ambulatory Visit: Payer: Self-pay

## 2021-01-01 DIAGNOSIS — G20 Parkinson's disease: Secondary | ICD-10-CM

## 2021-01-01 MED ORDER — CARBIDOPA-LEVODOPA 25-100 MG PO TABS: 2.0000 | ORAL_TABLET | Freq: Four times a day (QID) | ORAL | 0 refills | Status: DC

## 2021-01-08 ENCOUNTER — Ambulatory Visit: Payer: Medicare HMO | Admitting: Family Medicine

## 2021-01-08 ENCOUNTER — Other Ambulatory Visit: Payer: Self-pay | Admitting: Family Medicine

## 2021-01-09 DIAGNOSIS — R2689 Other abnormalities of gait and mobility: Secondary | ICD-10-CM | POA: Diagnosis not present

## 2021-01-09 DIAGNOSIS — M5459 Other low back pain: Secondary | ICD-10-CM | POA: Diagnosis not present

## 2021-01-09 DIAGNOSIS — R2681 Unsteadiness on feet: Secondary | ICD-10-CM | POA: Diagnosis not present

## 2021-01-09 DIAGNOSIS — R262 Difficulty in walking, not elsewhere classified: Secondary | ICD-10-CM | POA: Diagnosis not present

## 2021-01-09 DIAGNOSIS — G20 Parkinson's disease: Secondary | ICD-10-CM | POA: Diagnosis not present

## 2021-01-10 ENCOUNTER — Encounter: Payer: Self-pay | Admitting: Family Medicine

## 2021-01-10 ENCOUNTER — Other Ambulatory Visit: Payer: Self-pay

## 2021-01-10 ENCOUNTER — Ambulatory Visit (INDEPENDENT_AMBULATORY_CARE_PROVIDER_SITE_OTHER): Payer: Medicare HMO | Admitting: Family Medicine

## 2021-01-10 VITALS — BP 142/84 | HR 59 | Ht 70.0 in | Wt 228.0 lb

## 2021-01-10 DIAGNOSIS — I480 Paroxysmal atrial fibrillation: Secondary | ICD-10-CM

## 2021-01-10 DIAGNOSIS — I10 Essential (primary) hypertension: Secondary | ICD-10-CM

## 2021-01-10 DIAGNOSIS — D6851 Activated protein C resistance: Secondary | ICD-10-CM | POA: Diagnosis not present

## 2021-01-10 DIAGNOSIS — Z23 Encounter for immunization: Secondary | ICD-10-CM

## 2021-01-10 DIAGNOSIS — E7800 Pure hypercholesterolemia, unspecified: Secondary | ICD-10-CM | POA: Diagnosis not present

## 2021-01-10 MED ORDER — DUTASTERIDE 0.5 MG PO CAPS: 0.5000 mg | ORAL_CAPSULE | Freq: Every day | ORAL | 1 refills | Status: DC

## 2021-01-10 MED ORDER — LISINOPRIL 20 MG PO TABS: 20.0000 mg | ORAL_TABLET | Freq: Every day | ORAL | Status: DC

## 2021-01-10 NOTE — Progress Notes (Signed)
BP (!) 142/84   Pulse (!) 59   Ht 5\' 10"  (1.778 m)   Wt 228 lb (103.4 kg)   BMI 32.71 kg/m    Subjective:    Patient ID: Thomas [name], male    DOB: 03-23-1951, 70 y.o.   MRN: [id number]  HPI: Thomas [name] is a 71 y.o. male  Chief Complaint  Patient presents with   Hypertension   Continues with the low back pain. Doing his stretches and getting some PT.   HYPERTENSION / HYPERLIPIDEMIA Satisfied with current treatment? yes Duration of hypertension: chronic BP monitoring frequency: a few times a week BP range: continues to go up and down. Very rarely going high.  BP medication side effects: no Duration of hyperlipidemia: chronic Cholesterol medication side effects: no Cholesterol supplements: none Past cholesterol medications: simvastatin Medication compliance: excellent compliance Aspirin: no Recent stressors: no Recurrent headaches: no Visual changes: no Palpitations: no Dyspnea: no Chest pain: no Lower extremity edema: no Dizzy/lightheaded: no  Relevant past medical, surgical, family and social history reviewed and updated as indicated. Interim medical history since our last visit reviewed. Allergies and medications reviewed and updated.  Review of Systems  Constitutional: Negative.   Respiratory: Negative.    Cardiovascular: Negative.   Gastrointestinal: Negative.   Musculoskeletal: Negative.   Psychiatric/Behavioral: Negative.     Per HPI unless specifically indicated above     Objective:    BP (!) 142/84   Pulse (!) 59   Ht 5\' 10"  (1.778 m)   Wt 228 lb (103.4 kg)   BMI 32.71 kg/m   Wt Readings from Last 3 Encounters:  01/10/21 228 lb (103.4 kg)  11/09/20 227 lb 2 oz (103 kg)  10/30/20 226 lb (102.5 kg)    Physical Exam Vitals and nursing note reviewed.  Constitutional:      General: He is not in acute distress.    Appearance: Normal appearance. He is not ill-appearing, toxic-appearing or diaphoretic.  HENT:     Head: Normocephalic and  atraumatic.     Right Ear: External ear normal.     Left Ear: External ear normal.     Nose: Nose normal.     Mouth/Throat:     Mouth: Mucous membranes are moist.     Pharynx: Oropharynx is clear.  Eyes:     General: No scleral icterus.       Right eye: No discharge.        Left eye: No discharge.     Extraocular Movements: Extraocular movements intact.     Conjunctiva/sclera: Conjunctivae normal.     Pupils: Pupils are equal, round, and reactive to light.  Cardiovascular:     Rate and Rhythm: Normal rate and regular rhythm.     Pulses: Normal pulses.     Heart sounds: Normal heart sounds. No murmur heard.   No friction rub. No gallop.  Pulmonary:     Effort: Pulmonary effort is normal. No respiratory distress.     Breath sounds: Normal breath sounds. No stridor. No wheezing, rhonchi or rales.  Chest:     Chest wall: No tenderness.  Musculoskeletal:        General: Normal range of motion.     Cervical back: Normal range of motion and neck supple.  Skin:    General: Skin is warm and dry.     Capillary Refill: Capillary refill takes less than 2 seconds.     Coloration: Skin is not jaundiced or pale.     Findings:  No bruising, erythema, lesion or rash.  Neurological:     General: No focal deficit present.     Mental Status: He is alert and oriented to person, place, and time. Mental status is at baseline.  Psychiatric:        Mood and Affect: Mood normal.        Behavior: Behavior normal.        Thought Content: Thought content normal.        Judgment: Judgment normal.    Results for orders placed or performed during the hospital encounter of 10/08/20  Surgical pcr screen   Specimen: Nasal Mucosa; Nasal Swab  Result Value Ref Range   MRSA, PCR NEGATIVE NEGATIVE   Staphylococcus aureus POSITIVE (A) NEGATIVE  SARS CORONAVIRUS 2 (TAT 6-24 HRS) Nasopharyngeal Nasopharyngeal Swab   Specimen: Nasopharyngeal Swab  Result Value Ref Range   SARS Coronavirus 2 NEGATIVE NEGATIVE       Assessment & Plan:   Problem List Items Addressed This Visit       Cardiovascular and Mediastinum   Hypertension - Primary    Running low, will cut his lisinopril to 20mg . Call if running high again. Continue to monitor.        Relevant Medications   lisinopril (ZESTRIL) 20 MG tablet   Other Relevant Orders   Comprehensive metabolic panel   Atrial fibrillation (HCC)    Under good control on current regimen. Continue current regimen. Continue to monitor. Call with any concerns. Refills given. Labs drawn today.       Relevant Medications   lisinopril (ZESTRIL) 20 MG tablet     Hematopoietic and Hemostatic   Activated protein C resistance (HCC)    Under good control on current regimen. Continue current regimen. Continue to monitor. Call with any concerns. Refills given. Labs drawn today.       Relevant Orders   Comprehensive metabolic panel   CBC with Differential/Platelet     Other   Hypercholesterolemia    Under good control on current regimen. Continue current regimen. Continue to monitor. Call with any concerns. Refills given. Labs drawn today.       Relevant Medications   lisinopril (ZESTRIL) 20 MG tablet   Other Relevant Orders   Comprehensive metabolic panel   Lipid Panel w/o Chol/HDL Ratio   Other Visit Diagnoses     Need for immunization against influenza       Relevant Orders   Flu Vaccine QUAD High Dose(Fluad) (Completed)        Follow up plan: Return in about 6 months (around 07/10/2021), or physical.

## 2021-01-10 NOTE — Assessment & Plan Note (Signed)
Under good control on current regimen. Continue current regimen. Continue to monitor. Call with any concerns. Refills given. Labs drawn today.   

## 2021-01-10 NOTE — Assessment & Plan Note (Addendum)
Running low, will cut his lisinopril to 20mg . Call if running high again. Continue to monitor.

## 2021-01-11 DIAGNOSIS — R262 Difficulty in walking, not elsewhere classified: Secondary | ICD-10-CM | POA: Diagnosis not present

## 2021-01-11 DIAGNOSIS — M5459 Other low back pain: Secondary | ICD-10-CM | POA: Diagnosis not present

## 2021-01-11 DIAGNOSIS — R2681 Unsteadiness on feet: Secondary | ICD-10-CM | POA: Diagnosis not present

## 2021-01-11 DIAGNOSIS — G20 Parkinson's disease: Secondary | ICD-10-CM | POA: Diagnosis not present

## 2021-01-11 DIAGNOSIS — R2689 Other abnormalities of gait and mobility: Secondary | ICD-10-CM | POA: Diagnosis not present

## 2021-01-14 DIAGNOSIS — R2689 Other abnormalities of gait and mobility: Secondary | ICD-10-CM | POA: Diagnosis not present

## 2021-01-14 DIAGNOSIS — R262 Difficulty in walking, not elsewhere classified: Secondary | ICD-10-CM | POA: Diagnosis not present

## 2021-01-14 DIAGNOSIS — R2681 Unsteadiness on feet: Secondary | ICD-10-CM | POA: Diagnosis not present

## 2021-01-14 DIAGNOSIS — G20 Parkinson's disease: Secondary | ICD-10-CM | POA: Diagnosis not present

## 2021-01-14 DIAGNOSIS — M5459 Other low back pain: Secondary | ICD-10-CM | POA: Diagnosis not present

## 2021-01-17 DIAGNOSIS — G20 Parkinson's disease: Secondary | ICD-10-CM | POA: Diagnosis not present

## 2021-01-17 DIAGNOSIS — R2689 Other abnormalities of gait and mobility: Secondary | ICD-10-CM | POA: Diagnosis not present

## 2021-01-17 DIAGNOSIS — R2681 Unsteadiness on feet: Secondary | ICD-10-CM | POA: Diagnosis not present

## 2021-01-17 DIAGNOSIS — R262 Difficulty in walking, not elsewhere classified: Secondary | ICD-10-CM | POA: Diagnosis not present

## 2021-01-17 DIAGNOSIS — M5459 Other low back pain: Secondary | ICD-10-CM | POA: Diagnosis not present

## 2021-01-21 DIAGNOSIS — M5459 Other low back pain: Secondary | ICD-10-CM | POA: Diagnosis not present

## 2021-01-21 DIAGNOSIS — R2681 Unsteadiness on feet: Secondary | ICD-10-CM | POA: Diagnosis not present

## 2021-01-21 DIAGNOSIS — G20 Parkinson's disease: Secondary | ICD-10-CM | POA: Diagnosis not present

## 2021-01-21 DIAGNOSIS — R262 Difficulty in walking, not elsewhere classified: Secondary | ICD-10-CM | POA: Diagnosis not present

## 2021-01-21 DIAGNOSIS — R2689 Other abnormalities of gait and mobility: Secondary | ICD-10-CM | POA: Diagnosis not present

## 2021-01-28 DIAGNOSIS — M5459 Other low back pain: Secondary | ICD-10-CM | POA: Diagnosis not present

## 2021-01-28 DIAGNOSIS — R262 Difficulty in walking, not elsewhere classified: Secondary | ICD-10-CM | POA: Diagnosis not present

## 2021-01-28 DIAGNOSIS — R2689 Other abnormalities of gait and mobility: Secondary | ICD-10-CM | POA: Diagnosis not present

## 2021-01-28 DIAGNOSIS — G20 Parkinson's disease: Secondary | ICD-10-CM | POA: Diagnosis not present

## 2021-01-28 DIAGNOSIS — R2681 Unsteadiness on feet: Secondary | ICD-10-CM | POA: Diagnosis not present

## 2021-01-30 DIAGNOSIS — M545 Low back pain, unspecified: Secondary | ICD-10-CM | POA: Diagnosis not present

## 2021-02-01 DIAGNOSIS — R2689 Other abnormalities of gait and mobility: Secondary | ICD-10-CM | POA: Diagnosis not present

## 2021-02-01 DIAGNOSIS — R2681 Unsteadiness on feet: Secondary | ICD-10-CM | POA: Diagnosis not present

## 2021-02-01 DIAGNOSIS — R262 Difficulty in walking, not elsewhere classified: Secondary | ICD-10-CM | POA: Diagnosis not present

## 2021-02-01 DIAGNOSIS — G20 Parkinson's disease: Secondary | ICD-10-CM | POA: Diagnosis not present

## 2021-02-01 DIAGNOSIS — M5459 Other low back pain: Secondary | ICD-10-CM | POA: Diagnosis not present

## 2021-02-04 DIAGNOSIS — R2689 Other abnormalities of gait and mobility: Secondary | ICD-10-CM | POA: Diagnosis not present

## 2021-02-04 DIAGNOSIS — G20 Parkinson's disease: Secondary | ICD-10-CM | POA: Diagnosis not present

## 2021-02-04 DIAGNOSIS — M5459 Other low back pain: Secondary | ICD-10-CM | POA: Diagnosis not present

## 2021-02-04 DIAGNOSIS — R2681 Unsteadiness on feet: Secondary | ICD-10-CM | POA: Diagnosis not present

## 2021-02-04 DIAGNOSIS — R262 Difficulty in walking, not elsewhere classified: Secondary | ICD-10-CM | POA: Diagnosis not present

## 2021-02-06 ENCOUNTER — Telehealth: Payer: Self-pay | Admitting: Family Medicine

## 2021-02-06 DIAGNOSIS — R2681 Unsteadiness on feet: Secondary | ICD-10-CM | POA: Diagnosis not present

## 2021-02-06 DIAGNOSIS — R2689 Other abnormalities of gait and mobility: Secondary | ICD-10-CM | POA: Diagnosis not present

## 2021-02-06 DIAGNOSIS — M5459 Other low back pain: Secondary | ICD-10-CM | POA: Diagnosis not present

## 2021-02-06 DIAGNOSIS — G20 Parkinson's disease: Secondary | ICD-10-CM | POA: Diagnosis not present

## 2021-02-06 DIAGNOSIS — R262 Difficulty in walking, not elsewhere classified: Secondary | ICD-10-CM | POA: Diagnosis not present

## 2021-02-06 NOTE — Telephone Encounter (Signed)
Copied from [place] [id number]. Topic: General - Other >> Feb 06, 2021  1:49 PM Leward Quan A wrote: Reason for CRM: Patient wife called in to inform Dr [name] that they asked at last visit to have dutasteride (AVODART) 0.5 MG capsule removed from his medication list say that they have received this medication in the mail and was told by the pharmacy that Dr [name] released it for them to send it out to patient. Patient asking for this medication to be taken off his med list immediately Any questions please call Ph# [phone]

## 2021-02-07 ENCOUNTER — Other Ambulatory Visit: Payer: Self-pay | Admitting: Family Medicine

## 2021-02-07 NOTE — Telephone Encounter (Signed)
Has been removed. Please call pharamcy to cancel refills.thanks.

## 2021-02-07 NOTE — Progress Notes (Signed)
Patient ID: Thomas [name], male   DOB: 1951-03-25, 70 y.o.   MRN: [id number] Cardiology Office Note  Date:  02/08/2021   ID:  Thomas [name], DOB 05-Feb-1951, MRN [id number]  PCP:  Thomas Roys, DO   Chief Complaint  Patient presents with   12 month follow up     "Doing well." Medications reviewed by the patient verbally.     HPI:  70 yo male with a history of  DVT, PE on chronic anticoagulation Parkinson's,  paroxysmal atrial fibrillation,  on xarelto,  07/2015 DJD on CT scan thoracic spine No prior smoking history, no diabetes, cholesterol well controlled on a statin who presents for follow-up of his atrial fibrillation   In follow-up today he presents with family Reports feeling relatively well, active, does exercises on a regular basis  Goes to rock steady boxing, 3x a week Gym 3x  Had back surgery Low blood pressure after surgery Medications temporarily held, has since restarted  If blood pressure goes high takes amlodipine or hydralazine, cannot remember which one.  Rarely has to take extra medication  Regular basis takes lisinopril 20 daily with metoprolol succinate 25 daily  EKG personally reviewed by myself on todays visit Sinus brady 56 bpm no significant ST or T wave changes  Lab work reviewed with him Lab Results  Component Value Date   CHOL 127 07/10/2020   HDL 53 07/10/2020   LDLCALC 60 07/10/2020   TRIG 67 07/10/2020    Other past medical history reviewed 06/23/2014 Lower extremity ultrasound revealed persistent partially occlusive thrombus in the left posterior tibial, popliteal, and mid femoral veins.     01/02/2015 Left lower extremity duplex  revealed persistent but improving nonocclusive partial thrombus in the femoral and popliteal veins.  Calf veins were patient.    Chest CT on 06/23/2014 revealed no pulmonary nodules or adenopathy.    He did not do well on amiodarone, felt this made his tremor worse He is followed by neurology in  [place], Dr. Carles [name]  Previously seen in the emergency room in the setting of upper respiratory infection, found to be in atrial fibrillation, converted in the emergency room after he was given amiodarone   PMH:   has a past medical history of AF (atrial fibrillation) (Thomas [name]), Allergy, Arthritis, Arthritis of knee, Atrial fibrillation (Thomas [name]), DVT (deep venous thrombosis) (Thomas [name]), Dysrhythmia, History of kidney stones, Hyperchloremia, Hypercholesterolemia, Hypertension, Nephrolithiasis, Parkinson's disease (Thomas [name]), Plantar wart, Pneumonia, PONV (postoperative nausea and vomiting), Pulmonary embolism (Thomas [name]), Sciatica, Seasonal allergies, Stroke (Thomas [name]), and TIA (transient ischemic attack).  PSH:    Past Surgical History:  Procedure Laterality Date   APPENDECTOMY     CATARACT EXTRACTION Bilateral 10/2019 11/2019   COLONOSCOPY WITH PROPOFOL N/A 07/27/2019   Procedure: COLONOSCOPY WITH PROPOFOL;  Surgeon: Lin Landsman, MD;  Location: [facility];  Service: Endoscopy;  Laterality: N/A;   EYE SURGERY     KNEE ARTHROSCOPY WITH MENISCAL REPAIR Right 2009   KNEE SURGERY Right    LUMBAR LAMINECTOMY/DECOMPRESSION MICRODISCECTOMY Left 10/11/2020   Procedure: Left Lumbar Three-Four, Lumbar Fou-Five  Microdiscectomy;  Surgeon: Erline Levine, MD;  Location: [facility];  Service: Neurosurgery;  Laterality: Left;   T&A     TONSILLECTOMY      Current Outpatient Medications  Medication Sig Dispense Refill   acetaminophen (TYLENOL) 500 MG tablet Take 1,000 mg by mouth daily.     amantadine (SYMMETREL) 100 MG capsule TAKE 1 CAPSULE THREE TIMES DAILY 270 capsule 0   amLODipine (NORVASC) 2.5 MG  tablet Take 0.5 tablets (1.25 mg total) by mouth daily as needed. Take on days where BP>160 (Patient taking differently: Take 2.5 mg by mouth daily as needed (BP>160/90).) 45 tablet 1   calcium carbonate (TUMS - DOSED IN MG ELEMENTAL CALCIUM) 500 MG chewable tablet Chew 1 tablet by mouth daily as needed for indigestion or  heartburn.     carbidopa-levodopa (SINEMET CR) 50-200 MG tablet TAKE 1 TABLET AT BEDTIME 90 tablet 1   carbidopa-levodopa (SINEMET IR) 25-100 MG tablet Take 2 tablets by mouth 4 (four) times daily. TAKE 2 TABLETS AT 6AM, 10AM, AND 2PM, AND 6PM 720 tablet 0   cetirizine (ZYRTEC) 10 MG tablet Take 10 mg by mouth at bedtime.     cholecalciferol (VITAMIN D3) 25 MCG (1000 UNIT) tablet Take 1,000 Units by mouth daily.     diclofenac Sodium (VOLTAREN) 1 % GEL APPLY TOPICALLY 4 GRAMS FOUR TIMES DAILY (Patient taking differently: Apply 4 g topically 4 (four) times daily as needed (pain).) 400 g 1   hydrALAZINE (APRESOLINE) 25 MG tablet Take 1 tablet (25 mg total) by mouth 3 (three) times daily as needed (for SBP >160). 90 tablet 3   HYDROcodone-acetaminophen (NORCO/VICODIN) 5-325 MG tablet Take 0.5-1 tablets by mouth every 4 (four) hours as needed for moderate pain. 20 tablet 0   lisinopril (ZESTRIL) 20 MG tablet Take 1 tablet (20 mg total) by mouth daily.     melatonin 5 MG TABS Take 5 mg by mouth at bedtime.     methocarbamol (ROBAXIN) 500 MG tablet Take 1 tablet (500 mg total) by mouth every 6 (six) hours as needed for muscle spasms. 90 tablet 0   metoprolol succinate (TOPROL-XL) 25 MG 24 hr tablet TAKE 1 TABLET (25 MG TOTAL) BY MOUTH DAILY. (Patient taking differently: Take 25 mg by mouth at bedtime.) 90 tablet 1   Multiple Vitamin (MULTIVITAMIN) tablet Take 1 tablet by mouth daily at 6 PM.     polyethylene glycol powder (GLYCOLAX/MIRALAX) 17 GM/SCOOP powder Take 17 g by mouth 2 (two) times daily as needed. (Patient taking differently: Take 17 g by mouth at bedtime.) 3350 g 1   PREVIDENT 5000 BOOSTER PLUS 1.1 % PSTE Place 1 application onto teeth 2 (two) times daily.     rivaroxaban (XARELTO) 20 MG TABS tablet Take 1 tablet (20 mg total) by mouth daily. 90 tablet 3   rOPINIRole (REQUIP) 2 MG tablet TAKE 1 TABLET THREE TIMES DAILY 270 tablet 0   simvastatin (ZOCOR) 40 MG tablet Take 1 tablet (40 mg total)  by mouth daily. 90 tablet 1   SYSTANE ULTRA 0.4-0.3 % SOLN Place 1 drop into both eyes daily as needed (dry eyes).     tamsulosin (FLOMAX) 0.4 MG CAPS capsule Take 1 capsule (0.4 mg total) by mouth daily. 90 capsule 1   traMADol (ULTRAM) 50 MG tablet Take 50 mg by mouth at bedtime.     traZODone (DESYREL) 50 MG tablet TAKE 1/2 TO 1 TABLET AT BEDTIME AS NEEDED FOR SLEEP 90 tablet 0   Insulin Pen Needle (PEN NEEDLES) 30G X 5 MM MISC 1 each by Does not apply route daily. (Patient not taking: Reported on 02/08/2021) 30 each 0   No current facility-administered medications for this visit.     Allergies:   Codeine and Other   Social History:  The patient  reports that he has never smoked. He has never used smokeless tobacco. He reports that he does not drink alcohol and does not use  drugs.   Family History:   family history includes Coronary artery disease in his mother; Deep vein thrombosis in his sister; Heart attack in his sister; Parkinson's disease in his father.    Review of Systems: Review of Systems  Respiratory: Negative.    Cardiovascular: Negative.   Gastrointestinal: Negative.   Musculoskeletal:  Positive for joint pain.  Neurological: Negative.   Psychiatric/Behavioral: Negative.    All other systems reviewed and are negative.  PHYSICAL EXAM: VS:  BP 124/80 (BP Location: Left Arm, Patient Position: Sitting, Cuff Size: Normal)   Pulse (!) 56   Ht 5' 9.5" (1.765 m)   Wt 230 lb 2 oz (104.4 kg)   SpO2 98%   BMI 33.50 kg/m  , BMI Body mass index is 33.5 kg/m. Constitutional:  oriented to person, place, and time. No distress.  HENT:  Head: Grossly normal Eyes:  no discharge. No scleral icterus.  Neck: No JVD, no carotid bruits  Cardiovascular: Regular rate and rhythm, no murmurs appreciated Pulmonary/Chest: Clear to auscultation bilaterally, no wheezes or rails Abdominal: Soft.  no distension.  no tenderness.  Musculoskeletal: Normal range of motion Neurological:   normal muscle tone. Coordination normal. No atrophy Skin: Skin warm and dry Psychiatric: normal affect, pleasant  Recent Labs: 07/10/2020: TSH 1.130 09/18/2020: ALT 6; BUN 20; Creatinine, Ser 1.07; Hemoglobin 13.6; Platelets 165; Potassium 4.0; Sodium 140    Lipid Panel Lab Results  Component Value Date   CHOL 127 07/10/2020   HDL 53 07/10/2020   LDLCALC 60 07/10/2020   TRIG 67 07/10/2020      Wt Readings from Last 3 Encounters:  02/08/21 230 lb 2 oz (104.4 kg)  01/10/21 228 lb (103.4 kg)  11/09/20 227 lb 2 oz (103 kg)     ASSESSMENT AND PLAN:  Paroxysmal atrial fibrillation (HCC) -  Maintaining normal sinus rhythm, tolerating metoprolol succinate 25 daily Xarelto 20  PVCs Asymptomatic  Hypercholesterolemia Cholesterol is at goal on the current lipid regimen. No changes to the medications were made.  Essential hypertension Blood pressure is well controlled on today's visit. No changes made to the medications. Extra amlodipine as needed (or hydralazine depending on his preference)  Back pain Recent back surgery During exercise  Deep vein thrombosis (DVT) of left lower extremity, unspecified chronicity, unspecified vein (HCC)  nonocclusive clot left popliteal vein  on chronic anticoagulation xarelto.  No recent events  Other chronic pulmonary embolism (HCC) On chronic anticoagulation, Xarelto  Parkinson's disease ([place]) Exercising on a regular basis, balance stable   Total encounter time more than 25 minutes  Greater than 50% was spent in counseling and coordination of care with the patient  No orders of the defined types were placed in this encounter.   Signed, Esmond Plants, M.D., Ph.D. 02/08/2021  [place], [place]

## 2021-02-08 ENCOUNTER — Other Ambulatory Visit: Payer: Self-pay

## 2021-02-08 ENCOUNTER — Encounter: Payer: Self-pay | Admitting: Cardiovascular Disease

## 2021-02-08 ENCOUNTER — Telehealth: Payer: Self-pay | Admitting: Cardiovascular Disease

## 2021-02-08 ENCOUNTER — Ambulatory Visit: Payer: Medicare HMO | Admitting: Cardiovascular Disease

## 2021-02-08 VITALS — BP 124/80 | HR 56 | Ht 69.5 in | Wt 230.1 lb

## 2021-02-08 DIAGNOSIS — I10 Essential (primary) hypertension: Secondary | ICD-10-CM

## 2021-02-08 DIAGNOSIS — I2782 Chronic pulmonary embolism: Secondary | ICD-10-CM

## 2021-02-08 DIAGNOSIS — I480 Paroxysmal atrial fibrillation: Secondary | ICD-10-CM | POA: Diagnosis not present

## 2021-02-08 DIAGNOSIS — E7800 Pure hypercholesterolemia, unspecified: Secondary | ICD-10-CM

## 2021-02-08 DIAGNOSIS — G20 Parkinson's disease: Secondary | ICD-10-CM

## 2021-02-08 MED ORDER — HYDRALAZINE HCL 25 MG PO TABS: 25.0000 mg | ORAL_TABLET | Freq: Three times a day (TID) | ORAL | 3 refills | Status: DC | PRN

## 2021-02-08 NOTE — Patient Instructions (Addendum)
Medication Instructions:  No changes  If you need a refill on your cardiac medications before your next appointment, please call your pharmacy.   Lab work: No new labs needed  Testing/Procedures: No new testing needed  Follow-Up: At [facility], you and your health needs are our priority.  As part of our continuing mission to provide you with exceptional heart care, we have created designated Provider Care Teams.  These Care Teams include your primary Cardiologist (physician) and Advanced Practice Providers (APPs -  Physician Assistants and Nurse Practitioners) who all work together to provide you with the care you need, when you need it.  You will need a follow up appointment in 12 months  Providers on your designated Care Team:   Christopher Berge, NP Ryan Dunn, PA-C Cadence Furth, PA-C  COVID-19 Vaccine Information can be found at: [email or web address] For questions related to vaccine distribution or appointments, please email [email or web address] or call [phone].   

## 2021-02-08 NOTE — Telephone Encounter (Signed)
*  STAT* If patient is at the pharmacy, call can be transferred to refill team.   1. Which medications need to be refilled? (please list name of each medication and dose if known) Hydralazine  2. Which pharmacy/location (including street and city if local pharmacy) is medication to be sent to? [place]  3. Do they need a 30 day or 90 day supply? [place]

## 2021-02-08 NOTE — Telephone Encounter (Signed)
Disp Refills Start End   hydrALAZINE (APRESOLINE) 25 MG tablet 90 tablet 3 02/08/2021    Sig - Route: Take 1 tablet (25 mg total) by mouth 3 (three) times daily as needed (for SBP >160). Takes in the evening - Oral   Sent to pharmacy as: hydrALAZINE (APRESOLINE) 25 MG tablet   E-Prescribing Status: Receipt confirmed by pharmacy (02/08/2021  1:57 PM EDT)

## 2021-02-08 NOTE — Telephone Encounter (Signed)
Return call to pt's wife Thomas [name] regarding pt's amlodipine and hydralazine. Pt take these meds as follow.  Amlodipine 2.5 mg  In the morning as needed BP >160/90 Hydralazine 25 mg In the evening TID PRN SBP >160   Pt's med list updated to reflect current use. Thomas [name] thanks for the clarification updating [name] med list.

## 2021-02-08 NOTE — Telephone Encounter (Signed)
Patient's wife is calling to discuss why patient is taking Amlodipine and Hydralazine. Please call to discuss.

## 2021-02-18 DIAGNOSIS — G20 Parkinson's disease: Secondary | ICD-10-CM | POA: Diagnosis not present

## 2021-02-18 DIAGNOSIS — R2681 Unsteadiness on feet: Secondary | ICD-10-CM | POA: Diagnosis not present

## 2021-02-18 DIAGNOSIS — R262 Difficulty in walking, not elsewhere classified: Secondary | ICD-10-CM | POA: Diagnosis not present

## 2021-02-18 DIAGNOSIS — R2689 Other abnormalities of gait and mobility: Secondary | ICD-10-CM | POA: Diagnosis not present

## 2021-02-18 DIAGNOSIS — M5459 Other low back pain: Secondary | ICD-10-CM | POA: Diagnosis not present

## 2021-02-21 DIAGNOSIS — M5459 Other low back pain: Secondary | ICD-10-CM | POA: Diagnosis not present

## 2021-02-21 DIAGNOSIS — G20 Parkinson's disease: Secondary | ICD-10-CM | POA: Diagnosis not present

## 2021-02-21 DIAGNOSIS — R2681 Unsteadiness on feet: Secondary | ICD-10-CM | POA: Diagnosis not present

## 2021-02-21 DIAGNOSIS — R262 Difficulty in walking, not elsewhere classified: Secondary | ICD-10-CM | POA: Diagnosis not present

## 2021-02-21 DIAGNOSIS — R2689 Other abnormalities of gait and mobility: Secondary | ICD-10-CM | POA: Diagnosis not present

## 2021-02-22 ENCOUNTER — Ambulatory Visit (INDEPENDENT_AMBULATORY_CARE_PROVIDER_SITE_OTHER): Payer: Medicare HMO | Admitting: Family Medicine

## 2021-02-22 ENCOUNTER — Other Ambulatory Visit: Payer: Self-pay

## 2021-02-22 ENCOUNTER — Encounter: Payer: Self-pay | Admitting: Family Medicine

## 2021-02-22 ENCOUNTER — Ambulatory Visit
Admission: RE | Admit: 2021-02-22 | Discharge: 2021-02-22 | Disposition: A | Discharge status: Home / Self Care | Payer: Medicare HMO | Source: Ambulatory Visit | Attending: Family Medicine | Admitting: Family Medicine

## 2021-02-22 ENCOUNTER — Telehealth: Payer: Self-pay | Admitting: Neurology

## 2021-02-22 ENCOUNTER — Ambulatory Visit: Payer: Medicare HMO | Admitting: Family Medicine

## 2021-02-22 VITALS — BP 126/81 | HR 64 | Temp 99.8°F

## 2021-02-22 DIAGNOSIS — R509 Fever, unspecified: Secondary | ICD-10-CM | POA: Diagnosis not present

## 2021-02-22 DIAGNOSIS — R262 Difficulty in walking, not elsewhere classified: Secondary | ICD-10-CM | POA: Diagnosis not present

## 2021-02-22 DIAGNOSIS — R29818 Other symptoms and signs involving the nervous system: Secondary | ICD-10-CM | POA: Diagnosis not present

## 2021-02-22 HISTORY — DX: Difficulty in walking, not elsewhere classified: R26.2

## 2021-02-22 NOTE — Assessment & Plan Note (Signed)
Acute change in patient with parkinson's disease. No pain. Mild cough and frequency. UA clear- will send for culture. Awaiting Flu and COVID swabs which had to be sent out today due to [facility] being closed in our office. Given sudden change, will check CT head to r/o stroke. Await results.

## 2021-02-22 NOTE — Telephone Encounter (Signed)
Spoke with pt wife and informed her that he can take an extra 1/2 to 1 levodopa if that happens, she was advised that Dr Tat wonders if he doesn't have something like a UTI since this started so quickly. He should go to PCP and get checked out to be sure. Pt wife stated that they would call the PCP

## 2021-02-22 NOTE — Telephone Encounter (Signed)
Patient's wife [name] called and said she urgently wants some advice from Dr. Carles Collet.   For the last 3 days, the patient has been having problems with freezing up.   It was really bad last night.  Today is is worse and he cannot pick up his legs.   [facility] on [address]

## 2021-02-22 NOTE — Progress Notes (Signed)
BP 126/81   Pulse 64   Temp 99.8 F (37.7 C) (Oral)   SpO2 95%    Subjective:    Patient ID: Thomas [name], male    DOB: Apr 10, 1951, 70 y.o.   MRN: [id number]  HPI: Thomas [name] is a 70 y.o. male  Chief Complaint  Patient presents with   Difficulty Walking   Urinary Tract Infection    Patient wife states the patient has had real choppy steps lately and she reached out to his neurology office and was told to reach out to PCP due to it may be caused by a UTI. Patient states when he has to use the restroom he has to try and get there quick due to he has had some accidents lately. Patient wife states his prescription was increased due to his choppy-ness.    Nasal Congestion    Patient states he has had some nasal congestion that drains down into this throat. Patient is complaining of a scratchy throat. Patient wife states patient has tried the cough syrup that patient currently has.    Urinary Frequency   Has been having trouble walking for 3 days. He notes that it feels like a weakness in his legs. Having trouble lifting his legs and moving them. He has felt like this 1x before when he had a UTI. His neurologist was concerned about that so she had him come here for evaluation. He has also not been feeling well.   He had a coughing fit on Tuesday when he was out to eat, and has been having sore throat since then. He has been coughing and having some low grade fevers as well  UPPER RESPIRATORY TRACT INFECTION Worst symptom: sore throat and coughing Fever: yes Cough: yes Shortness of breath: no Wheezing: no Chest pain: no Chest tightness: no Chest congestion: no Nasal congestion: no Runny nose: no Post nasal drip: yes Sneezing: no Sore throat: yes Swollen glands: no Sinus pressure: no Headache: no Face pain: no Toothache: no Ear pain: no  Ear pressure: no  Eyes red/itching:no Eye drainage/crusting: no  Vomiting: no Rash: no Fatigue: no Sick contacts: no Strep  contacts: no  Context: worse Recurrent sinusitis: no Relief with OTC cold/cough medications: no  Treatments attempted: none    URINARY SYMPTOMS Duration: couple of days Dysuria: no Urinary frequency: yes Urgency: yes Small volume voids: no Symptom severity: mild Urinary incontinence: yes Foul odor: yes Hematuria: no Abdominal pain: no Back pain: no Suprapubic pain/pressure: no Flank pain: no Fever:  yes and low grade Vomiting: no Relief with cranberry juice: no Relief with pyridium: no Status: stable Previous urinary tract infection: no Recurrent urinary tract infection: no History of sexually transmitted disease: no Penile discharge: no Treatments attempted: increasing fluids  Relevant past medical, surgical, family and social history reviewed and updated as indicated. Interim medical history since our last visit reviewed. Allergies and medications reviewed and updated.  Review of Systems  Constitutional:  Positive for diaphoresis, fatigue and fever. Negative for activity change, appetite change, chills and unexpected weight change.  HENT: Negative.    Respiratory: Negative.    Cardiovascular: Negative.   Gastrointestinal: Negative.   Genitourinary:  Positive for frequency and urgency. Negative for decreased urine volume, difficulty urinating, dysuria, enuresis, flank pain, genital sores, hematuria, penile discharge, penile pain, penile swelling, scrotal swelling and testicular pain.  Musculoskeletal:  Positive for arthralgias, back pain, gait problem, myalgias, neck pain and neck stiffness. Negative for joint swelling.  Skin: Negative.  Neurological:  Positive for weakness. Negative for dizziness, tremors, seizures, syncope, facial asymmetry, speech difficulty, light-headedness, numbness and headaches.  Hematological: Negative.   Psychiatric/Behavioral: Negative.     Per HPI unless specifically indicated above     Objective:    BP 126/81   Pulse 64   Temp  99.8 F (37.7 C) (Oral)   SpO2 95%   Wt Readings from Last 3 Encounters:  02/08/21 230 lb 2 oz (104.4 kg)  01/10/21 228 lb (103.4 kg)  11/09/20 227 lb 2 oz (103 kg)    Physical Exam Vitals and nursing note reviewed.  Constitutional:      General: He is not in acute distress.    Appearance: Normal appearance. He is ill-appearing. He is not toxic-appearing or diaphoretic.  HENT:     Head: Normocephalic and atraumatic.     Right Ear: External ear normal.     Left Ear: External ear normal.     Nose: Nose normal.     Mouth/Throat:     Mouth: Mucous membranes are moist.     Pharynx: Oropharynx is clear.  Eyes:     General: No scleral icterus.       Right eye: No discharge.        Left eye: No discharge.     Extraocular Movements: Extraocular movements intact.     Conjunctiva/sclera: Conjunctivae normal.     Pupils: Pupils are equal, round, and reactive to light.  Cardiovascular:     Rate and Rhythm: Normal rate and regular rhythm.     Pulses: Normal pulses.     Heart sounds: Normal heart sounds. No murmur heard.   No friction rub. No gallop.  Pulmonary:     Effort: Pulmonary effort is normal. No respiratory distress.     Breath sounds: Normal breath sounds. No stridor. No wheezing, rhonchi or rales.  Chest:     Chest wall: No tenderness.  Musculoskeletal:        General: Normal range of motion.     Cervical back: Normal range of motion and neck supple.  Skin:    General: Skin is warm and dry.     Capillary Refill: Capillary refill takes less than 2 seconds.     Coloration: Skin is not jaundiced or pale.     Findings: No bruising, erythema, lesion or rash.  Neurological:     General: No focal deficit present.     Mental Status: He is alert and oriented to person, place, and time. Mental status is at baseline.  Psychiatric:        Mood and Affect: Mood normal.        Behavior: Behavior normal.        Thought Content: Thought content normal.        Judgment: Judgment  normal.    Results for orders placed or performed during the hospital encounter of 10/08/20  Surgical pcr screen   Specimen: Nasal Mucosa; Nasal Swab  Result Value Ref Range   MRSA, PCR NEGATIVE NEGATIVE   Staphylococcus aureus POSITIVE (A) NEGATIVE  SARS CORONAVIRUS 2 (TAT 6-24 HRS) Nasopharyngeal Nasopharyngeal Swab   Specimen: Nasopharyngeal Swab  Result Value Ref Range   SARS Coronavirus 2 NEGATIVE NEGATIVE      Assessment & Plan:   Problem List Items Addressed This Visit       Other   Inability to walk - Primary    Acute change in patient with parkinson's disease. No pain. Mild cough and frequency. UA clear-  will send for culture. Awaiting Flu and COVID swabs which had to be sent out today due to [facility] being closed in our office. Given sudden change, will check CT head to r/o stroke. Await results.       Relevant Orders   CT HEAD WO CONTRAST (5MM)   Other Visit Diagnoses     Fever, unspecified fever cause       UA clear on dipstick- will send out for formal read and culture. Await Flu and COVID. No localizing symptoms. Warning signs to go to ER discussed. Await results   Relevant Orders   Influenza a and b   Novel Coronavirus, NAA ([facility])   Urine Culture   Urinalysis        Follow up plan: Return if symptoms worsen or fail to improve.

## 2021-02-23 LAB — SARS-COV-2, NAA 2 DAY TAT

## 2021-02-23 LAB — NOVEL CORONAVIRUS, NAA: SARS-CoV-2, NAA: DETECTED — AB

## 2021-02-25 ENCOUNTER — Telehealth: Payer: Self-pay

## 2021-02-25 ENCOUNTER — Other Ambulatory Visit: Payer: Self-pay | Admitting: Family Medicine

## 2021-02-25 MED ORDER — MOLNUPIRAVIR EUA 200MG CAPSULE: 4.0000 | ORAL_CAPSULE | Freq: Two times a day (BID) | ORAL | 0 refills | Status: AC

## 2021-02-25 NOTE — Telephone Encounter (Signed)
Wife given COVID 19 results and instructions. Verbalizes understanding.

## 2021-02-26 ENCOUNTER — Ambulatory Visit: Payer: Medicare HMO | Admitting: Family Medicine

## 2021-02-26 LAB — INFLUENZA A AND B

## 2021-02-28 LAB — URINE CULTURE

## 2021-03-01 IMAGING — US VENOUS DOPPLER ULTRASOUND OF LEFT LOWER EXTREMITY
1 series · 13 of 24 positions shown · non-contrast
Comparison: Left lower extremity duplex 01/02/2015, 06/23/2014

CLINICAL DATA: Left lower extremity pain. History of left lower
extremity DVT.



[Series 1: venous doppler ultrasound of left lower extremity · 13 of 36 slices shown]
[im 1/36]
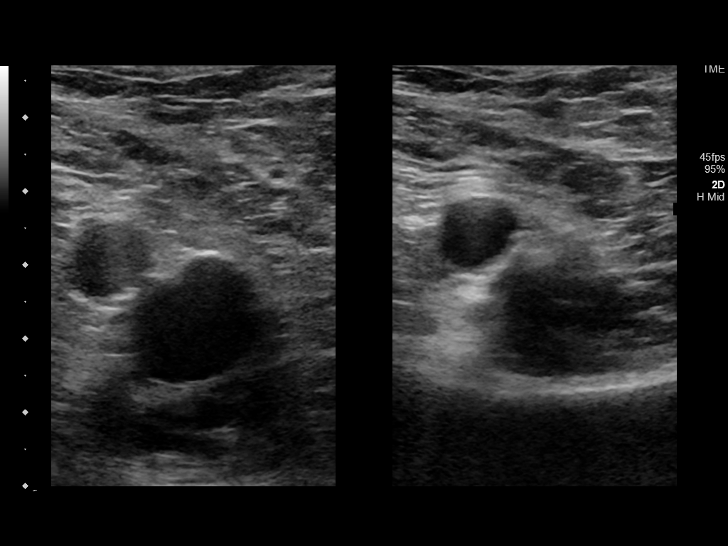
[im 4/36]
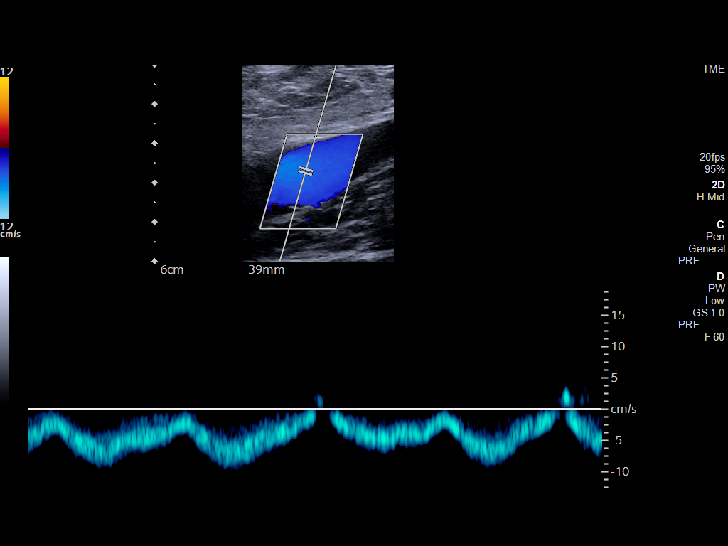
[im 7/36]
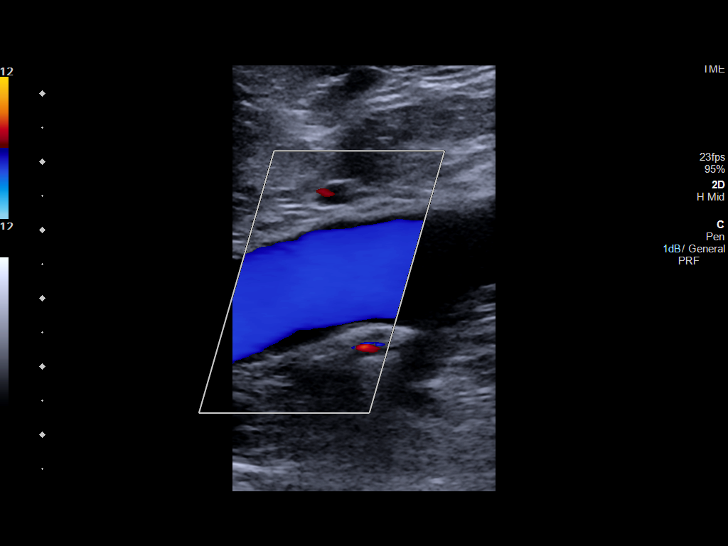
[im 10/36]
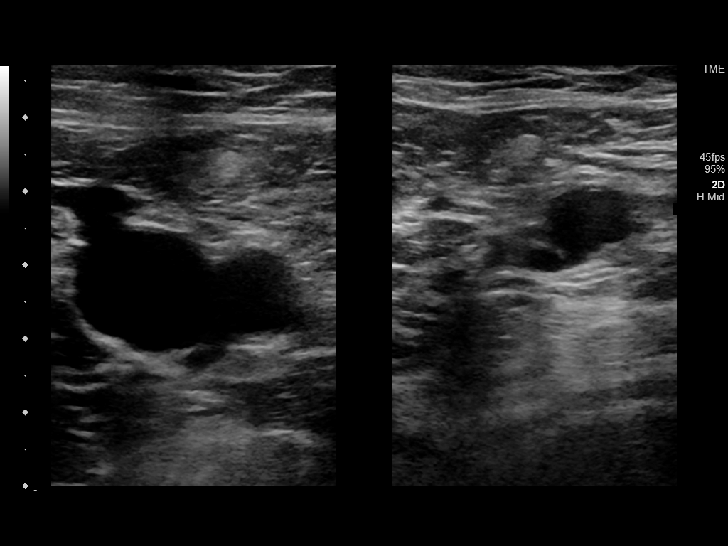
[im 13/36]
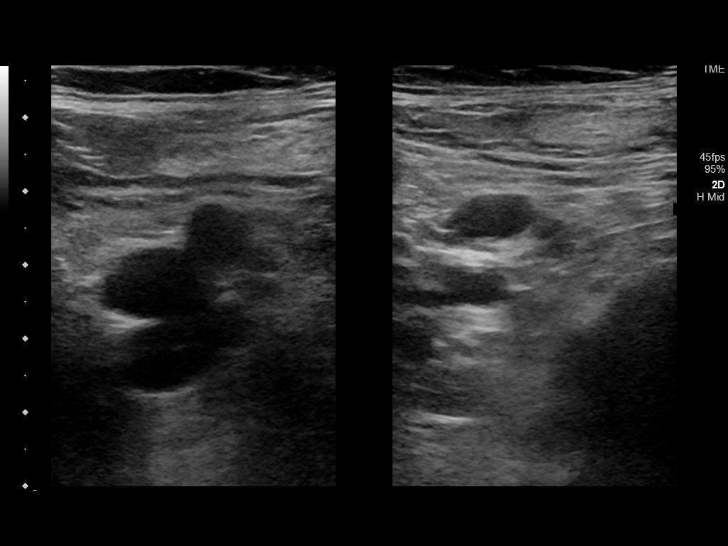
[im 16/36]
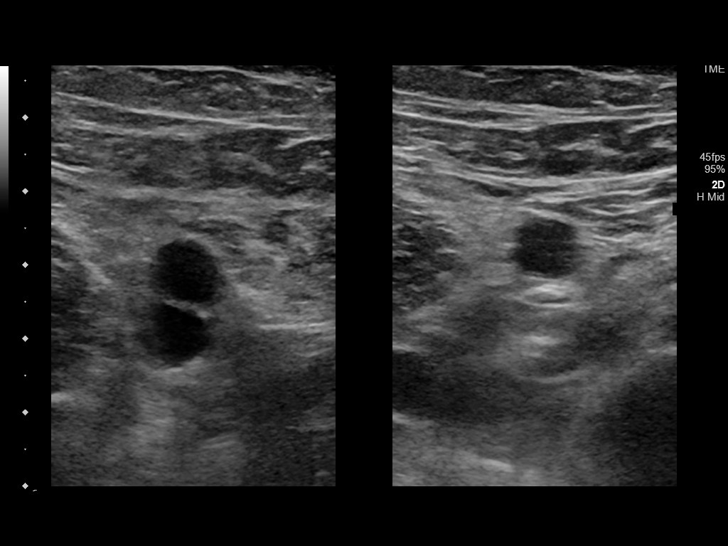
[im 19/36]
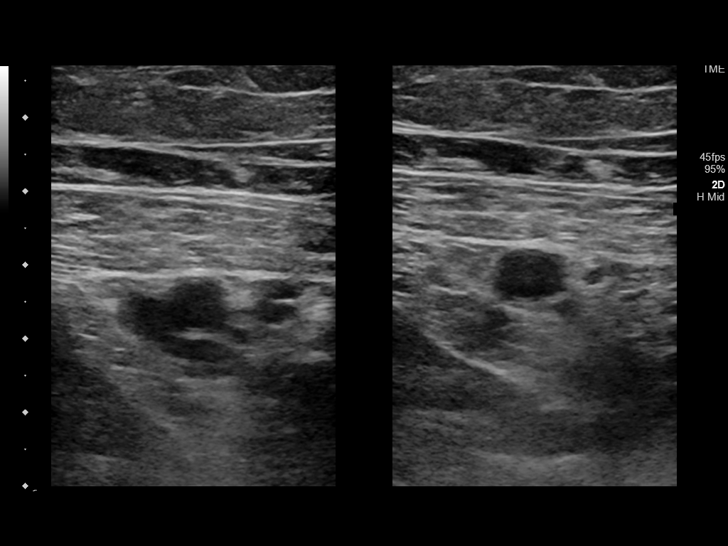
[im 20/36]
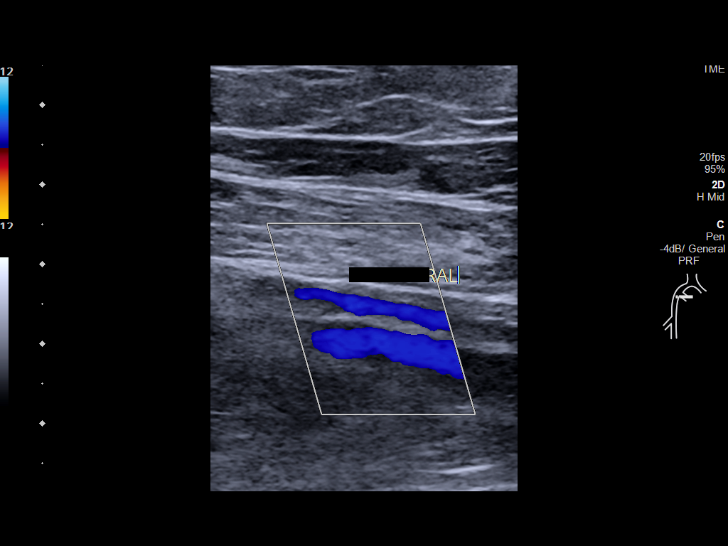
[im 23/36]
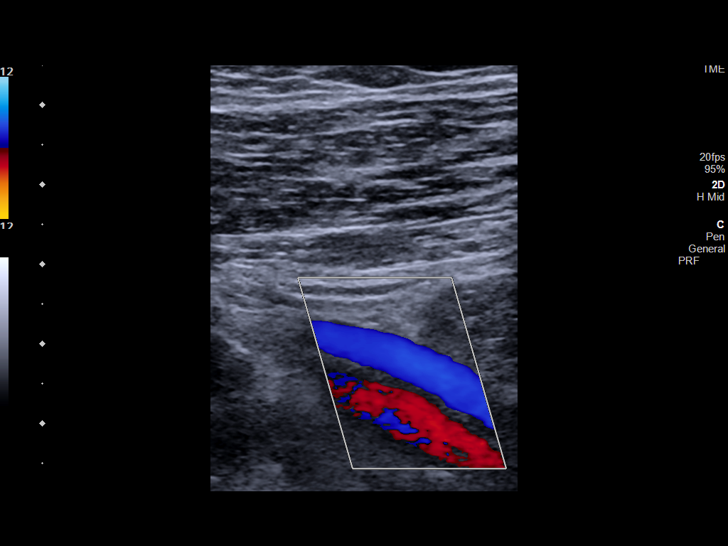
[im 26/36]
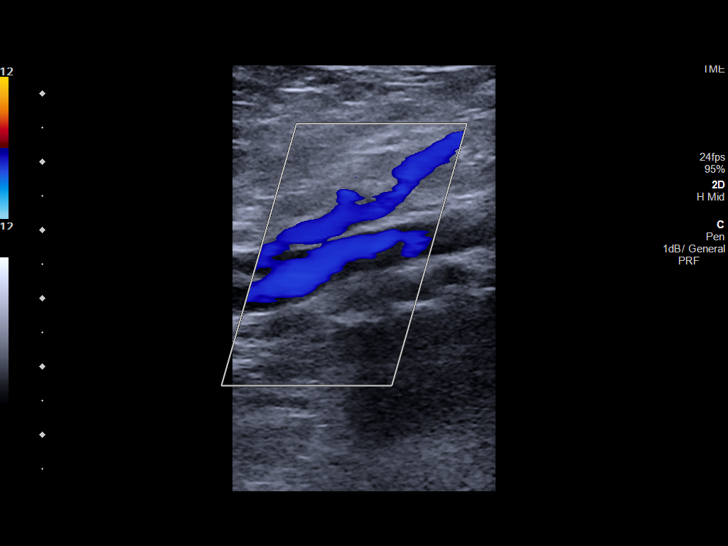
[im 29/36]
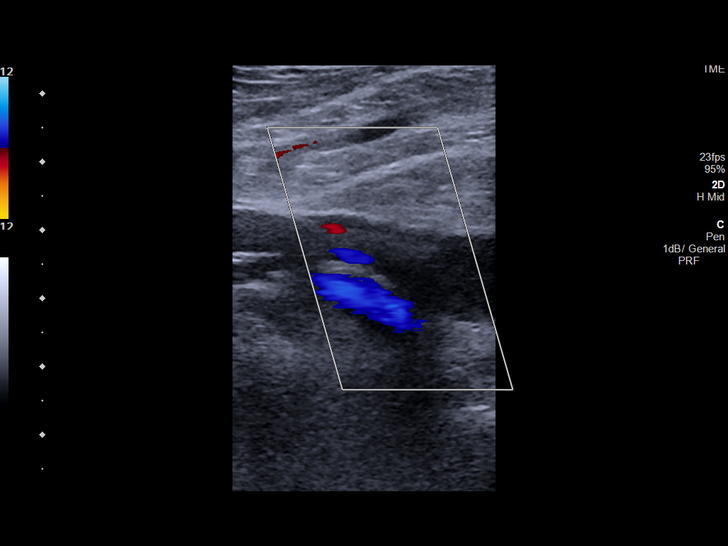
[im 32/36]
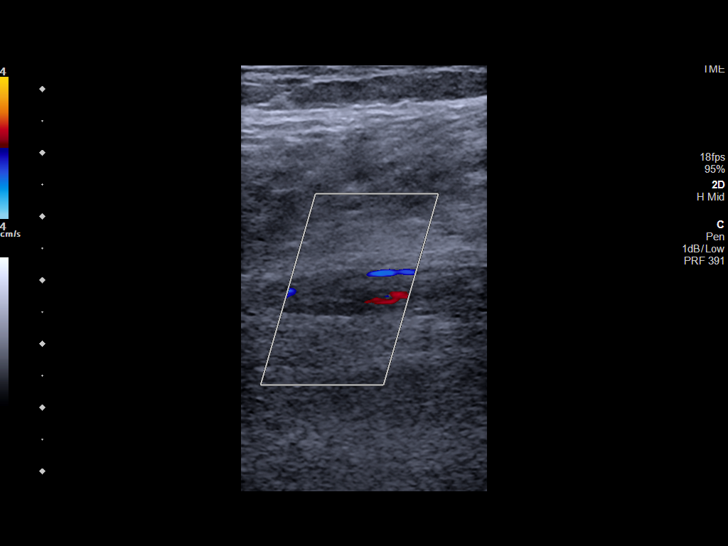
[im 36/36]
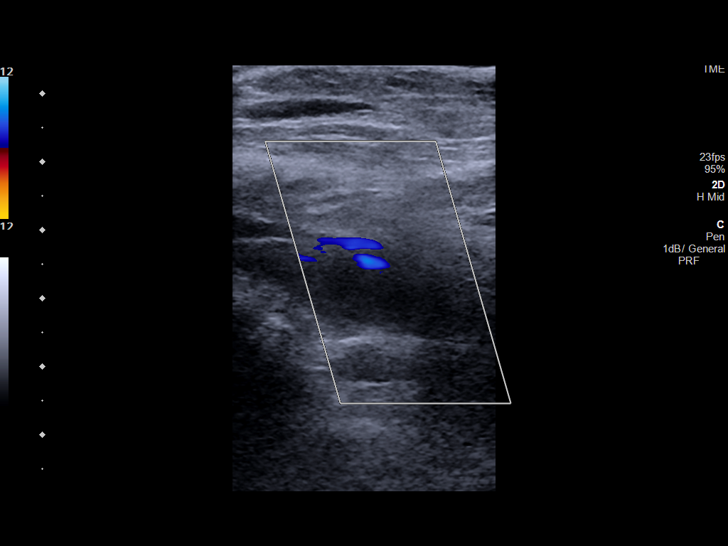

[13 of 24 positions shown; findings below may reference images not displayed]

FINDINGS: Contralateral Common Femoral Vein: Respiratory phasicity is normal
and symmetric with the symptomatic side. No evidence of thrombus.
Normal compressibility.

Common Femoral Vein: No evidence of thrombus. Normal
compressibility, respiratory phasicity and response to augmentation.

Saphenofemoral Junction: No evidence of thrombus. Normal
compressibility and flow on color Doppler imaging.

Profunda Femoral Vein: No evidence of thrombus. Normal
compressibility and flow on color Doppler imaging.

Femoral Vein: Previous nonocclusive thrombus has resolved.

Popliteal Vein: Partially occlusive thrombus in the popliteal vein
again seen.

Calf Veins: Partially occlusive thrombus in the posterior tibial
vein which had previously been patent. Occlusive thrombus of the
peroneal vein which had previously been patent.

Superficial Great Saphenous Vein: Nonocclusive thrombus in the
greater saphenous vein.

Venous Reflux:  None.

Other Findings:  None.
IMPRESSION: History of left lower extremity DVT in 8151. Partially occlusive
popliteal vein thrombus is chronic. Previous femoral vein thrombus
has resolved. However thrombus in the calf veins is new/progressed
from 8151.

These results were called by telephone at the time of interpretation
on 08/28/2018 at [DATE] to PA TIKESWAR ZODE , who verbally
acknowledged these results.

## 2021-03-05 DIAGNOSIS — G20 Parkinson's disease: Secondary | ICD-10-CM | POA: Diagnosis not present

## 2021-03-05 DIAGNOSIS — R2689 Other abnormalities of gait and mobility: Secondary | ICD-10-CM | POA: Diagnosis not present

## 2021-03-05 DIAGNOSIS — M5459 Other low back pain: Secondary | ICD-10-CM | POA: Diagnosis not present

## 2021-03-05 DIAGNOSIS — R262 Difficulty in walking, not elsewhere classified: Secondary | ICD-10-CM | POA: Diagnosis not present

## 2021-03-05 DIAGNOSIS — R2681 Unsteadiness on feet: Secondary | ICD-10-CM | POA: Diagnosis not present

## 2021-03-08 DIAGNOSIS — R262 Difficulty in walking, not elsewhere classified: Secondary | ICD-10-CM | POA: Diagnosis not present

## 2021-03-08 DIAGNOSIS — R2681 Unsteadiness on feet: Secondary | ICD-10-CM | POA: Diagnosis not present

## 2021-03-08 DIAGNOSIS — M5459 Other low back pain: Secondary | ICD-10-CM | POA: Diagnosis not present

## 2021-03-08 DIAGNOSIS — G20 Parkinson's disease: Secondary | ICD-10-CM | POA: Diagnosis not present

## 2021-03-08 DIAGNOSIS — R2689 Other abnormalities of gait and mobility: Secondary | ICD-10-CM | POA: Diagnosis not present

## 2021-03-12 DIAGNOSIS — M5459 Other low back pain: Secondary | ICD-10-CM | POA: Diagnosis not present

## 2021-03-12 DIAGNOSIS — R2681 Unsteadiness on feet: Secondary | ICD-10-CM | POA: Diagnosis not present

## 2021-03-12 DIAGNOSIS — R2689 Other abnormalities of gait and mobility: Secondary | ICD-10-CM | POA: Diagnosis not present

## 2021-03-12 DIAGNOSIS — G20 Parkinson's disease: Secondary | ICD-10-CM | POA: Diagnosis not present

## 2021-03-12 DIAGNOSIS — R262 Difficulty in walking, not elsewhere classified: Secondary | ICD-10-CM | POA: Diagnosis not present

## 2021-03-14 DIAGNOSIS — R2681 Unsteadiness on feet: Secondary | ICD-10-CM | POA: Diagnosis not present

## 2021-03-14 DIAGNOSIS — R2689 Other abnormalities of gait and mobility: Secondary | ICD-10-CM | POA: Diagnosis not present

## 2021-03-14 DIAGNOSIS — G20 Parkinson's disease: Secondary | ICD-10-CM | POA: Diagnosis not present

## 2021-03-14 DIAGNOSIS — R262 Difficulty in walking, not elsewhere classified: Secondary | ICD-10-CM | POA: Diagnosis not present

## 2021-03-14 DIAGNOSIS — M5459 Other low back pain: Secondary | ICD-10-CM | POA: Diagnosis not present

## 2021-03-18 DIAGNOSIS — R2689 Other abnormalities of gait and mobility: Secondary | ICD-10-CM | POA: Diagnosis not present

## 2021-03-18 DIAGNOSIS — G20 Parkinson's disease: Secondary | ICD-10-CM | POA: Diagnosis not present

## 2021-03-18 DIAGNOSIS — R262 Difficulty in walking, not elsewhere classified: Secondary | ICD-10-CM | POA: Diagnosis not present

## 2021-03-18 DIAGNOSIS — M5459 Other low back pain: Secondary | ICD-10-CM | POA: Diagnosis not present

## 2021-03-18 DIAGNOSIS — R2681 Unsteadiness on feet: Secondary | ICD-10-CM | POA: Diagnosis not present

## 2021-03-20 ENCOUNTER — Other Ambulatory Visit: Payer: Self-pay | Admitting: Neurology

## 2021-03-20 DIAGNOSIS — G20 Parkinson's disease: Secondary | ICD-10-CM

## 2021-03-29 ENCOUNTER — Telehealth: Payer: Self-pay | Admitting: Neurology

## 2021-03-29 ENCOUNTER — Other Ambulatory Visit: Payer: Self-pay | Admitting: Family Medicine

## 2021-03-29 NOTE — Telephone Encounter (Signed)
Snyder called o confirm that patient needs this medication sent to a local pharmacy  he is completely out.

## 2021-03-29 NOTE — Telephone Encounter (Signed)
Patients wife needs some advise regarding Thomas [name]. He is having a difficult time walking, especially in the afternoon. His feet are choppy.stated they have already increased his levo, so they are unsure what to do.

## 2021-03-29 NOTE — Telephone Encounter (Signed)
rivaroxaban (XARELTO) 20 MG TABS tablet  [id number] - [address], [place] AT [place]  [address] [place] [address]  Phone: [phone] Fax: [phone]    Pt's wife called and is requesting an emergency supply, he is completely out of his current supply and was denied refills from his mail order.  Pt's wife is requesting a 10 day supply from the local pharmacy please advise.

## 2021-03-29 NOTE — Telephone Encounter (Signed)
Patient has increased his CL 1/2 to 1 tablet she said it helped at first for a few days but no help at this point. Patient doing PT and boxing but he is having a lot of pain and tightness in his back makes it very difficult for him

## 2021-03-30 MED ORDER — RIVAROXABAN 20 MG PO TABS
20.0000 mg | ORAL_TABLET | Freq: Every day | ORAL | 0 refills | Status: DC
Start: 2021-03-30 — End: 2021-11-28

## 2021-03-30 NOTE — Telephone Encounter (Signed)
Refilled #[id number]. Requested Prescriptions  Pending Prescriptions Disp Refills   rivaroxaban (XARELTO) 20 MG TABS tablet 10 tablet 0    Sig: Take 1 tablet (20 mg total) by mouth daily.     Hematology: Anticoagulants - rivaroxaban Failed - 03/30/2021  7:18 AM      Failed - ALT in normal range and within 180 days    ALT  Date Value Ref Range Status  09/18/2020 6 0 - 44 IU/L Final          Failed - AST in normal range and within 180 days    AST  Date Value Ref Range Status  09/18/2020 12 0 - 40 IU/L Final          Passed - Cr in normal range and within 360 days    Creatinine, Ser  Date Value Ref Range Status  09/18/2020 1.07 0.76 - 1.27 mg/dL Final          Passed - HCT in normal range and within 360 days    Hematocrit  Date Value Ref Range Status  09/18/2020 39.9 37.5 - 51.0 % Final          Passed - HGB in normal range and within 360 days    Hemoglobin  Date Value Ref Range Status  09/18/2020 13.6 13.0 - 17.7 g/dL Final          Passed - PLT in normal range and within 360 days    Platelets  Date Value Ref Range Status  09/18/2020 165 150 - 450 x10E3/uL Final          Passed - Valid encounter within last 12 months    Recent Outpatient Visits           1 month ago Inability to walk   [facility], [place], DO   2 months ago Primary hypertension   [facility] [place], Megan P, DO   4 months ago Benign prostatic hyperplasia with urinary frequency   [place], Megan P, DO   6 months ago Activated protein C resistance ([place])   [place], Megan P, DO   6 months ago Preop exam for internal medicine   [place], Megan P, DO       Future Appointments             In 1 month Ralene Bathe, MD [place]   In 3 months [name], Barb Merino, DO [place], [place]   In 3 months  [employer], [place]

## 2021-04-01 ENCOUNTER — Other Ambulatory Visit: Payer: Self-pay

## 2021-04-01 MED ORDER — AMANTADINE HCL 100 MG PO CAPS: ORAL_CAPSULE | ORAL | 0 refills | Status: DC

## 2021-04-01 NOTE — Patient Instructions (Signed)
Week 1: Take ropinirole, 3 mg in the morning, 2 mg in the afternoon and 2 mg in the evening  Week 2: Take ropinirole, 3 mg in the morning, 3 mg in the afternoon and 2 mg in the evening  Week 3 and thereafter Take ropinirole, 3 mg 3 times per day  Take your carbidopa/levodopa 25/100 IR and carbidopa/levodopa 50/200 as previously prescribed. No changes in your amantadine, 100 mg 3 times per day.

## 2021-04-01 NOTE — Progress Notes (Signed)
Assessment/Plan:    1.  Parkinsons Disease  -The patient's increased trouble walking is likely related to COVID positivity.  Patient became COVID-positive at the same time that he was unable to walk.  This was about 4 weeks ago, but it can take some time for this to resolve.  Patient and wife do state, however, that he had a time when he was able to walk better than he is now, so we do need to look for alternative sources.  I am going to repeat his CBC and his UA with culture and sensitivity.  They wanted to take that to [facility], so written prescription for the labs was given.             -Slowly increase his Requip from 2 mg 3 times per day to 3 mg 3 times per day.  Titration schedule was given to the patient.  R/b/se discussed.             -Continue carbidopa/levodopa 25/100, 2 tablets at 6 AM/10 AM/2 PM/6 PM             -Continue carbidopa/levodopa 50/200 CR at bedtime  -discussed exercise  -We discussed that it used to be thought that levodopa would increase risk of melanoma but now it is believed that Parkinsons itself likely increases risk of melanoma. he is to get regular skin checks.  -referral for PT at [facility].  Written prescription given.   2.  Parkinson's dyskinesia             -Continue amantadine, 100 mg 3 times per day.   3.  RBD             -Discussed safety.  Declines medication right now.   4.  MCI             -Neurocognitive test in August, 2020 without evidence of dementia.  Following.   5.  Neurogenic Orthostatic Hypotension             -likely due to BP meds in combination with Parkinsons Disease.    His PCP told him to only take amlodipine if his blood pressure is running greater than 160.  He is on hydralazine, lisinopril, metoprolol and tamsulosin which all are driving down blood pressure as well.  This is something that I have consistently been concerned about, and brought that up again today.  They do tell me they are watching it closely at home and he has been  asymptomatic.   6.    Lumbar spinal stenosis and neural foraminal stenosis             -status post surgical intervention with microdiscectomy at L3-L4 and L4-L5 on October 11, 2020 with Dr. Vertell Limber  7.  Sleep disturbance  -On trazodone, prescribed by primary care  -On melatonin, 5 mg nightly  Subjective:   Brandy Kabat was seen today in follow up for Parkinsons disease.  My previous records were reviewed prior to todays visit as well as outside records available to me.  Patient worked in today for urgent visit.  Records reviewed.  Patient's wife called about 4 weeks ago stating that he was having trouble with freezing for the last 3 days.  It seemed to come on quickly.  I told them they can certainly take an extra half to 1 levodopa if needed, but also recommended that he get to his primary care physician just to make sure nothing medically was going on.  He did follow-up with  his primary care physician that same day.  Urine culture was negative.  However, it turns out patient was positive for COVID.  Primary care did a head CT that was nonacute. Wife states that he did recover.  Freezing got better.   His wife called me last Friday and we worked him in today because the patient was having continued trouble walking and freezing was worse again.  She does state that Mcarthur Rossetti was also an issue and they didn't ship his amantadine and he was out of it for 5 days (restarted yesterday).  He also tried taking 3  tablets of levodopa at one single dose and didn't seem to help.  Using WC to get around more.  No hallucinations.  Last fall was 2-3 weeks ago - he did trip over shoes and put a hole in dry wall.  Didn't get hurt.  He thinks that was not an unusual fall because he was walking backward.    Current prescribed movement disorder medications:   Ropinirole, 2 mg 3 times per day Carbidopa/levodopa 25/100, 2 tablets at 6 AM/2 tablets at 10 AM/2 tablets at 2 PM/2 tablets at 6 PM Amantadine 100 mg 3 times per  day carbidopa/levodopa 50/200 CR q hs   ALLERGIES:   Allergies  Allergen Reactions   Codeine Other (See Comments)    Hallucinations   Other Nausea Only    General anesthesia     CURRENT MEDICATIONS:  Outpatient Encounter Medications as of 04/02/2021  Medication Sig   amantadine (SYMMETREL) 100 MG capsule TAKE 1 CAPSULE THREE TIMES DAILY   amLODipine (NORVASC) 2.5 MG tablet Take 2.5 mg by mouth daily as needed (BP >160/90). Take in the morning   calcium carbonate (TUMS - DOSED IN MG ELEMENTAL CALCIUM) 500 MG chewable tablet Chew 1 tablet by mouth daily as needed for indigestion or heartburn.   carbidopa-levodopa (SINEMET CR) 50-200 MG tablet TAKE 1 TABLET AT BEDTIME   carbidopa-levodopa (SINEMET IR) 25-100 MG tablet Take 2 tablets by mouth 4 (four) times daily. TAKE 2 TABLETS AT 6AM, 10AM, AND 2PM, AND 6PM   cetirizine (ZYRTEC) 10 MG tablet Take 10 mg by mouth at bedtime.   cholecalciferol (VITAMIN D3) 25 MCG (1000 UNIT) tablet Take 1,000 Units by mouth daily.   hydrALAZINE (APRESOLINE) 25 MG tablet Take 1 tablet (25 mg total) by mouth 3 (three) times daily as needed (for SBP >160). Takes in the evening   HYDROcodone-acetaminophen (NORCO/VICODIN) 5-325 MG tablet Take 0.5-1 tablets by mouth every 4 (four) hours as needed for moderate pain.   Insulin Pen Needle (PEN NEEDLES) 30G X 5 MM MISC 1 each by Does not apply route daily.   lisinopril (ZESTRIL) 20 MG tablet Take 1 tablet (20 mg total) by mouth daily.   melatonin 5 MG TABS Take 5 mg by mouth at bedtime.   methocarbamol (ROBAXIN) 500 MG tablet Take 1 tablet (500 mg total) by mouth every 6 (six) hours as needed for muscle spasms.   metoprolol succinate (TOPROL-XL) 25 MG 24 hr tablet TAKE 1 TABLET (25 MG TOTAL) BY MOUTH DAILY. (Patient taking differently: Take 25 mg by mouth at bedtime.)   Multiple Vitamin (MULTIVITAMIN) tablet Take 1 tablet by mouth daily at 6 PM.   polyethylene glycol powder (GLYCOLAX/MIRALAX) 17 GM/SCOOP powder Take  17 g by mouth 2 (two) times daily as needed. (Patient taking differently: Take 17 g by mouth at bedtime.)   PREVIDENT 5000 BOOSTER PLUS 1.1 % PSTE Place 1 application onto teeth 2 (  two) times daily.   rivaroxaban (XARELTO) 20 MG TABS tablet Take 1 tablet (20 mg total) by mouth daily.   rOPINIRole (REQUIP) 2 MG tablet TAKE 1 TABLET THREE TIMES DAILY   simvastatin (ZOCOR) 40 MG tablet Take 1 tablet (40 mg total) by mouth daily.   SYSTANE ULTRA 0.4-0.3 % SOLN Place 1 drop into both eyes daily as needed (dry eyes).   tamsulosin (FLOMAX) 0.4 MG CAPS capsule Take 1 capsule (0.4 mg total) by mouth daily.   traMADol (ULTRAM) 50 MG tablet Take 50 mg by mouth at bedtime.   traZODone (DESYREL) 50 MG tablet TAKE 1/2 TO 1 TABLET AT BEDTIME AS NEEDED FOR SLEEP   acetaminophen (TYLENOL) 500 MG tablet Take 1,000 mg by mouth daily.   diclofenac Sodium (VOLTAREN) 1 % GEL APPLY TOPICALLY 4 GRAMS FOUR TIMES DAILY (Patient taking differently: Apply 4 g topically 4 (four) times daily as needed (pain).)   No facility-administered encounter medications on file as of 04/02/2021.    Objective:   PHYSICAL EXAMINATION:    VITALS:   Vitals:   04/02/21 0845  BP: 119/82  Pulse: 69  SpO2: 99%  Weight: 231 lb 12.8 oz (105.1 kg)  Height: 5\' 7"  (1.702 m)     GEN:  The patient appears stated age and is in NAD. HEENT:  Normocephalic, atraumatic.  The mucous membranes are moist. The superficial temporal arteries are without ropiness or tenderness. CV:  brady Lungs:  CTAB Neck/HEME:  There are no carotid bruits bilaterally.   Neurological examination:  Orientation: The patient is alert and oriented x3. Cranial nerves: There is good facial symmetry with facial hypomimia. The speech is fluent and clear. Soft palate rises symmetrically and there is no tongue deviation. Hearing is intact to conversational tone. Sensation: Sensation is intact to light touch throughout Motor: Strength is at least antigravity  x4.  Movement examination: Tone: There is mild increased tone in the RUE Abnormal movements: none Coordination:  There is mild, with any form of RAMS, including alternating supination and pronation of the forearm, hand opening and closing, finger taps, heel taps and toe taps, R>L, LE>UE Gait and Station: The patient difficulty arising out of a deep-seated chair without the use of the hands. He pushes off to arise.  The patient's stride length is decreased with decreased arm swing bilaterally, R>L.  He is flexed at the waist.    I have reviewed and interpreted the following labs independently    Chemistry      Component Value Date/Time   NA 140 09/18/2020 1439   K 4.0 09/18/2020 1439   CL 102 09/18/2020 1439   CO2 24 09/18/2020 1439   BUN 20 09/18/2020 1439   CREATININE 1.07 09/18/2020 1439      Component Value Date/Time   CALCIUM 9.9 09/18/2020 1439   ALKPHOS 95 09/18/2020 1439   AST 12 09/18/2020 1439   ALT 6 09/18/2020 1439   BILITOT 0.6 09/18/2020 1439       Lab Results  Component Value Date   WBC 4.6 09/18/2020   HGB 13.6 09/18/2020   HCT 39.9 09/18/2020   MCV 91 09/18/2020   PLT 165 09/18/2020    Lab Results  Component Value Date   TSH 1.130 07/10/2020     Total time spent on today's visit was 41 minutes, including both face-to-face time and nonface-to-face time.  Time included that spent on review of records (prior notes available to me/labs/imaging if pertinent), discussing treatment and goals, answering patient's  questions and coordinating care.  Cc:  [name], DO

## 2021-04-01 NOTE — Telephone Encounter (Signed)
Called patients wife to let her know prescription has been sent I and that Dr. Carles Collet is trying to fit this patient in with the holidays it has been difficult to find a place but when we are able to find a spot the front desk will call to schedule this patient  wife is concerned about his ability to walk and not fall down

## 2021-04-01 NOTE — Telephone Encounter (Signed)
Patients wife called again this morning. She stated he needs a refill on his amantadine. He needs this medication now, he cant wait for the mail order. So she wants it called to [facility]. She said one of his legs went out and she doesn't want him to fall and get hurt. There was a note at 4:04pm to sch him this morning but that appt was already taken.

## 2021-04-01 NOTE — Telephone Encounter (Signed)
Pt is sch for tomorrow 04-02-21 @8 :45 with Tat

## 2021-04-02 ENCOUNTER — Ambulatory Visit: Payer: Medicare HMO | Admitting: Neurology

## 2021-04-02 ENCOUNTER — Other Ambulatory Visit: Payer: Self-pay | Admitting: Neurology

## 2021-04-02 ENCOUNTER — Encounter: Payer: Self-pay | Admitting: Neurology

## 2021-04-02 ENCOUNTER — Other Ambulatory Visit: Payer: Self-pay

## 2021-04-02 VITALS — BP 119/82 | HR 69 | Ht 67.0 in | Wt 231.8 lb

## 2021-04-02 DIAGNOSIS — G9340 Encephalopathy, unspecified: Secondary | ICD-10-CM

## 2021-04-02 DIAGNOSIS — G20 Parkinson's disease: Secondary | ICD-10-CM | POA: Diagnosis not present

## 2021-04-02 DIAGNOSIS — G20A1 Parkinson's disease without dyskinesia, without mention of fluctuations: Secondary | ICD-10-CM

## 2021-04-02 MED ORDER — ROPINIROLE HCL 3 MG PO TABS: 3.0000 mg | ORAL_TABLET | Freq: Three times a day (TID) | ORAL | 1 refills | Status: DC

## 2021-04-03 LAB — UA/M W/RFLX CULTURE, COMP
Bilirubin, UA: NEGATIVE
Glucose, UA: NEGATIVE
Leukocytes,UA: NEGATIVE
Nitrite, UA: NEGATIVE
Protein,UA: NEGATIVE
RBC, UA: NEGATIVE
Specific Gravity, UA: 1.027 (ref 1.005–1.030)
Urobilinogen, Ur: 0.2 mg/dL (ref 0.2–1.0)
pH, UA: 6 (ref 5.0–7.5)

## 2021-04-03 LAB — MICROSCOPIC EXAMINATION
Bacteria, UA: NONE SEEN
Casts: NONE SEEN /lpf
Epithelial Cells (non renal): NONE SEEN /hpf (ref 0–10)
WBC, UA: NONE SEEN /hpf (ref 0–5)

## 2021-04-03 LAB — CBC
Hematocrit: 43.9 % (ref 37.5–51.0)
Hemoglobin: 14.6 g/dL (ref 13.0–17.7)
MCH: 29.9 pg (ref 26.6–33.0)
MCHC: 33.3 g/dL (ref 31.5–35.7)
MCV: 90 fL (ref 79–97)
Platelets: 175 10*3/uL (ref 150–450)
RBC: 4.88 x10E6/uL (ref 4.14–5.80)
RDW: 11.8 % (ref 11.6–15.4)
WBC: 6.6 10*3/uL (ref 3.4–10.8)

## 2021-04-03 NOTE — Progress Notes (Signed)
Tried calling pt, no answer. LMOVM to call the office back.

## 2021-04-04 ENCOUNTER — Ambulatory Visit: Payer: Medicare HMO | Admitting: Neurology

## 2021-04-12 ENCOUNTER — Other Ambulatory Visit: Payer: Self-pay | Admitting: Family Medicine

## 2021-04-12 MED ORDER — SIMVASTATIN 40 MG PO TABS: 40.0000 mg | ORAL_TABLET | Freq: Every day | ORAL | 0 refills | Status: DC

## 2021-04-12 NOTE — Telephone Encounter (Signed)
Requested Prescriptions  Pending Prescriptions Disp Refills   simvastatin (ZOCOR) 40 MG tablet 90 tablet 0    Sig: Take 1 tablet (40 mg total) by mouth daily.     Cardiovascular:  Antilipid - Statins Passed - 04/12/2021 12:26 PM      Passed - Total Cholesterol in normal range and within 360 days    Cholesterol, Total  Date Value Ref Range Status  07/10/2020 127 100 - 199 mg/dL Final   Cholesterol Piccolo, Waived  Date Value Ref Range Status  03/01/2015 136 <200 mg/dL Final    Comment:                            Desirable                <200                         Borderline High      200- 239                         High                     >239          Passed - LDL in normal range and within 360 days    LDL Chol Calc (NIH)  Date Value Ref Range Status  07/10/2020 60 0 - 99 mg/dL Final         Passed - HDL in normal range and within 360 days    HDL  Date Value Ref Range Status  07/10/2020 53 >39 mg/dL Final         Passed - Triglycerides in normal range and within 360 days    Triglycerides  Date Value Ref Range Status  07/10/2020 67 0 - 149 mg/dL Final   Triglycerides Piccolo,Waived  Date Value Ref Range Status  03/01/2015 74 <150 mg/dL Final    Comment:                            Normal                   <150                         Borderline High     150 - 199                         High                200 - 499                         Very High                >499          Passed - Patient is not pregnant      Passed - Valid encounter within last 12 months    Recent Outpatient Visits          1 month ago Inability to walk   [facility], [place] P, DO   3 months ago Primary hypertension   [facility] [place], Megan P, DO   5 months ago Benign prostatic hyperplasia  with urinary frequency   [place], Megan P, DO   6 months ago Activated protein C resistance [facility])   [place], Megan P, DO   6 months ago Preop exam for internal medicine   [facility] [name], DO      Future Appointments            In 3 weeks Ralene Bathe, MD [place]   In 2 months [name], Barb Merino, DO [place], [place]   In 3 months  [facility], [place]

## 2021-04-12 NOTE — Telephone Encounter (Signed)
Copied from [place] [id number]. Topic: Quick Communication - Rx Refill/Question >> Apr 12, 2021 11:30 AM Tessa Lerner A wrote: Medication: simvastatin (ZOCOR) 40 MG tablet [[id number]] - patient has three tablets remaining   Has the patient contacted their pharmacy? No. The patient has had delays with [facility] and needs a modified prescription to get them through the holiday weekend. They're requesting a quantity of 15     (Agent: If no, request that the patient contact the pharmacy for the refill. If patient does not wish to contact the pharmacy document the reason why and proceed with request.) (Agent: If yes, when and what did the pharmacy advise?)  Preferred Pharmacy (with phone number or street name): [id number] - [place], [place] - [place]  Phone:  [phone] Fax:  [phone]    Has the patient been seen for an appointment in the last year OR does the patient have an upcoming appointment? Yes.    Agent: Please be advised that RX refills may take up to 3 business days. We ask that you follow-up with your pharmacy.

## 2021-04-24 DIAGNOSIS — M47816 Spondylosis without myelopathy or radiculopathy, lumbar region: Secondary | ICD-10-CM | POA: Diagnosis not present

## 2021-04-25 ENCOUNTER — Telehealth: Payer: Self-pay | Admitting: Neurology

## 2021-04-25 NOTE — Telephone Encounter (Signed)
Spoke with Pt wife about using [facility] pharmacy she stated that when she was looking at the price it wasn't as good as [employer] with the tier exception and they do not want to transfer his medication, they would like for [place] to continue with the tier exception because of the price and it was easy for her with medication coming from one location,

## 2021-04-25 NOTE — Telephone Encounter (Signed)
Patients wife [name] called and said patient will need a formulary exception for both carbidopa levodopa prescriptions and ropinirole.    Patients humana will not cover it and that's why this is needed.  Cc: Jari Sportsman

## 2021-04-26 NOTE — Telephone Encounter (Signed)
Pt wife called no answer left a voice mail to call back so we can let her know that 90 tablets of ropinirole is about $20 [facility] with the goodRX card and he could get that today with that. We would just need to know where to send his script,

## 2021-04-26 NOTE — Telephone Encounter (Signed)
Pt wife is asking for tier exception for Ropinirole 3mg  and for the Carbidopa-levodopa ( sinemet CR) 50-200mg .  She was advised to use the good RX card or mark [place] again, she wants the tier exception done so he dose not run out of medication. Pt wife advised we won't be able to do this every year and next year Dr Tat recommend that they look for a new medicare product that will cover these generic meds.

## 2021-04-26 NOTE — Telephone Encounter (Signed)
She stated that he has medication they are being proactive with humana in gap it was only $9 she stated $20 is a difference. She wants to see the price with tier exception, she still will not change pharmacy nor use good RX.

## 2021-04-26 NOTE — Telephone Encounter (Signed)
Pt wife called no answer left a voice mail to call back so we can let her know that 90 tablets of ropinirole is about $20 [facility] with the goodRX card and he could get that today with that. We would just need to know where to send his script

## 2021-04-29 ENCOUNTER — Telehealth: Payer: Self-pay

## 2021-04-29 NOTE — Telephone Encounter (Signed)
New messgae   Thomas [name] (Key: [id number])Need help? Call us at [phone] Status New(Not sent to plan) Next Steps Complete the request below.  Drug rOPINIRole HCl 3MG  tablets Form [employer] Medicare Tier Exceptions Form [employer] Prior Authorization Form for Tier Exceptions for Medicare Members ONLY [phone]

## 2021-04-30 ENCOUNTER — Telehealth: Payer: Self-pay

## 2021-04-30 DIAGNOSIS — R262 Difficulty in walking, not elsewhere classified: Secondary | ICD-10-CM | POA: Diagnosis not present

## 2021-04-30 DIAGNOSIS — R2689 Other abnormalities of gait and mobility: Secondary | ICD-10-CM | POA: Diagnosis not present

## 2021-04-30 DIAGNOSIS — R2681 Unsteadiness on feet: Secondary | ICD-10-CM | POA: Diagnosis not present

## 2021-04-30 DIAGNOSIS — G20 Parkinson's disease: Secondary | ICD-10-CM | POA: Diagnosis not present

## 2021-04-30 DIAGNOSIS — M5459 Other low back pain: Secondary | ICD-10-CM | POA: Diagnosis not present

## 2021-04-30 NOTE — Telephone Encounter (Signed)
New message Form [employer] Medicare Tier Exceptions Form  [name] (Key: [id number])Need help? Call us at [phone]  Status Sent to Plan today  Next StepsThe plan will fax you a determination, typically within 1 to 5 business days.  How do I follow up? Drug Carbidopa-Levodopa 25-100MG  tablets  Form [employer] Medicare Tier Exceptions Form  [employer] Prior Authorization Form for Tier Exceptions for Medicare Members ONLY  [phone] [phone]

## 2021-04-30 NOTE — Telephone Encounter (Signed)
New message Form [employer] Medicare Tier Exceptions Form  Thomas [name] (Key: [id number])Need help? Call us at [phone]  Status Sent to Plan today  Next Steps The plan will fax you a determination, typically within 1 to 5 business days.  How do I follow up? Drug Carbidopa-Levodopa ER 50-200MG  er tablets.  Form [employer] Medicare Tier Exceptions Form  [employer] Prior Authorization Form for Tier Exceptions for Medicare Members ONLY [phone] [phone]

## 2021-05-01 NOTE — Telephone Encounter (Signed)
Thomas [name] is working on pt Tier exception,

## 2021-05-02 DIAGNOSIS — G20 Parkinson's disease: Secondary | ICD-10-CM | POA: Diagnosis not present

## 2021-05-02 DIAGNOSIS — M5459 Other low back pain: Secondary | ICD-10-CM | POA: Diagnosis not present

## 2021-05-02 DIAGNOSIS — R2689 Other abnormalities of gait and mobility: Secondary | ICD-10-CM | POA: Diagnosis not present

## 2021-05-02 DIAGNOSIS — R262 Difficulty in walking, not elsewhere classified: Secondary | ICD-10-CM | POA: Diagnosis not present

## 2021-05-02 DIAGNOSIS — R2681 Unsteadiness on feet: Secondary | ICD-10-CM | POA: Diagnosis not present

## 2021-05-03 ENCOUNTER — Telehealth: Payer: Self-pay | Admitting: Neurology

## 2021-05-03 NOTE — Telephone Encounter (Signed)
Patients wife is returning a call to [employer]

## 2021-05-03 NOTE — Telephone Encounter (Signed)
Called patients wife did not receive an answer LVM

## 2021-05-03 NOTE — Telephone Encounter (Signed)
See other phone note

## 2021-05-03 NOTE — Telephone Encounter (Signed)
Received both [place] exemptions and the claims were denied for the patient to receive medication at a different rate than quoted by his insurance will call wife to inform

## 2021-05-03 NOTE — Telephone Encounter (Signed)
Pt wife called an informed that we Received both Teir exemptions and the claims were denied for the patient to receive medication at a different rate than quoted. I will mail out a good RX card to the pt ,

## 2021-05-07 ENCOUNTER — Ambulatory Visit: Payer: Medicare HMO | Admitting: Neurology

## 2021-05-07 DIAGNOSIS — M5459 Other low back pain: Secondary | ICD-10-CM | POA: Diagnosis not present

## 2021-05-07 DIAGNOSIS — R2689 Other abnormalities of gait and mobility: Secondary | ICD-10-CM | POA: Diagnosis not present

## 2021-05-07 DIAGNOSIS — R262 Difficulty in walking, not elsewhere classified: Secondary | ICD-10-CM | POA: Diagnosis not present

## 2021-05-07 DIAGNOSIS — R2681 Unsteadiness on feet: Secondary | ICD-10-CM | POA: Diagnosis not present

## 2021-05-07 DIAGNOSIS — G20 Parkinson's disease: Secondary | ICD-10-CM | POA: Diagnosis not present

## 2021-05-07 NOTE — Telephone Encounter (Signed)
Called patient and gave Dr. Arturo Morton recommendation

## 2021-05-07 NOTE — Telephone Encounter (Signed)
Patient to have injection in back [facility]. Patient's wife wants to know who Dr. Carles Collet would recommend, Dr. Vertell Limber is not there anymore.

## 2021-05-08 ENCOUNTER — Other Ambulatory Visit: Payer: Self-pay

## 2021-05-08 ENCOUNTER — Ambulatory Visit: Payer: Medicare HMO | Admitting: Dermatology

## 2021-05-08 DIAGNOSIS — D299 Benign neoplasm of male genital organ, unspecified: Secondary | ICD-10-CM

## 2021-05-08 DIAGNOSIS — Z1283 Encounter for screening for malignant neoplasm of skin: Secondary | ICD-10-CM

## 2021-05-08 DIAGNOSIS — D3615 Benign neoplasm of peripheral nerves and autonomic nervous system of abdomen: Secondary | ICD-10-CM | POA: Diagnosis not present

## 2021-05-08 DIAGNOSIS — L84 Corns and callosities: Secondary | ICD-10-CM | POA: Diagnosis not present

## 2021-05-08 DIAGNOSIS — L570 Actinic keratosis: Secondary | ICD-10-CM

## 2021-05-08 DIAGNOSIS — L821 Other seborrheic keratosis: Secondary | ICD-10-CM

## 2021-05-08 DIAGNOSIS — D3610 Benign neoplasm of peripheral nerves and autonomic nervous system, unspecified: Secondary | ICD-10-CM

## 2021-05-08 DIAGNOSIS — L578 Other skin changes due to chronic exposure to nonionizing radiation: Secondary | ICD-10-CM

## 2021-05-08 DIAGNOSIS — D225 Melanocytic nevi of trunk: Secondary | ICD-10-CM

## 2021-05-08 DIAGNOSIS — D492 Neoplasm of unspecified behavior of bone, soft tissue, and skin: Secondary | ICD-10-CM | POA: Diagnosis not present

## 2021-05-08 DIAGNOSIS — L814 Other melanin hyperpigmentation: Secondary | ICD-10-CM | POA: Diagnosis not present

## 2021-05-08 DIAGNOSIS — D1800 Hemangioma unspecified site: Secondary | ICD-10-CM

## 2021-05-08 NOTE — Progress Notes (Signed)
New Patient Visit  Subjective  Thomas [name] is a 71 y.o. male who presents for the following: Total body skin exam (No hx of skin ca, no fhx of skin ca) and scaly spot (L forehead, 25 yrs, pt peels off and comes back). The patient presents for Total-Body Skin Exam (TBSE) for skin cancer screening and mole check.  The patient has spots, moles and lesions to be evaluated, some may be new or changing and the patient has concerns that these could be cancer.   New patient referral from [place].  Patient accompanied by wife who contributes to history.  The following portions of the chart were reviewed this encounter and updated as appropriate:   Tobacco   Allergies   Meds   Problems   Med Hx   Surg Hx   Fam Hx      Review of Systems:  No other skin or systemic complaints except as noted in HPI or Assessment and Plan.  Objective  Well appearing patient in no apparent distress; mood and affect are within normal limits.  A full examination was performed including scalp, head, eyes, ears, nose, lips, neck, chest, axillae, abdomen, back, buttocks, bilateral upper extremities, bilateral lower extremities, hands, feet, fingers, toes, fingernails, and toenails. All findings within normal limits unless otherwise noted below.  L forehead x 3 (3) Pink scaly macules   L upper abdomen Fleshy pap  L sup pectoral 0.7cm irregular brown macule       bil plantar feet Callus bil plantar feet   Assessment & Plan   Lentigines - Scattered tan macules - Due to sun exposure - Benign-appearing, observe - Recommend daily broad spectrum sunscreen SPF 30+ to sun-exposed areas, reapply every 2 hours as needed. - Call for any changes  Seborrheic Keratoses - Stuck-on, waxy, tan-brown papules and/or plaques  - Benign-appearing - Discussed benign etiology and prognosis. - Observe - Call for any changes  Melanocytic Nevi - Tan-brown and/or pink-flesh-colored symmetric macules and  papules - Benign appearing on exam today - Observation - Call clinic for new or changing moles - Recommend daily use of broad spectrum spf 30+ sunscreen to sun-exposed areas.   Hemangiomas - Red papules - Discussed benign nature - Observe - Call for any changes - arms, trunk  Actinic Damage - Chronic condition, secondary to cumulative UV/sun exposure - diffuse scaly erythematous macules with underlying dyspigmentation - Recommend daily broad spectrum sunscreen SPF 30+ to sun-exposed areas, reapply every 2 hours as needed.  - Staying in the shade or wearing long sleeves, sun glasses (UVA+UVB protection) and wide brim hats (4-inch brim around the entire circumference of the hat) are also recommended for sun protection.  - Call for new or changing lesions.  Skin cancer screening performed today.  AK (actinic keratosis) (3) L forehead x 3  Destruction of lesion - L forehead x 3 Complexity: simple   Destruction method: cryotherapy   Informed consent: discussed and consent obtained   Timeout:  patient name, date of birth, surgical site, and procedure verified Lesion destroyed using liquid nitrogen: Yes   Region frozen until ice ball extended beyond lesion: Yes   Outcome: patient tolerated procedure well with no complications   Post-procedure details: wound care instructions given    Neurofibroma L upper abdomen  Benign-appearing.  Observation.  Call clinic for new or changing moles.  Recommend daily use of broad spectrum spf 30+ sunscreen to sun-exposed areas.    Neoplasm of skin L sup pectoral  Epidermal / dermal shaving  Lesion diameter (cm):  0.7 Informed consent: discussed and consent obtained   Timeout: patient name, date of birth, surgical site, and procedure verified   Procedure prep:  Patient was prepped and draped in usual sterile fashion Prep type:  Isopropyl alcohol Anesthesia: the lesion was anesthetized in a standard fashion   Anesthetic:  1% lidocaine w/  epinephrine 1-100,000 buffered w/ 8.4% NaHCO3 Instrument used: flexible razor blade   Hemostasis achieved with: pressure, aluminum chloride and electrodesiccation   Outcome: patient tolerated procedure well   Post-procedure details: sterile dressing applied and wound care instructions given   Dressing type: bandage and bacitracin    Nevus vs Dysplastic nevus, bx/shave removal today  Related Procedures Anatomic Pathology Report  Callus bil plantar feet  Benign, observe  Skin cancer screening   Return in about 1 year (around 05/08/2022) for TBSE, Hx of AKs.  I, Othelia Pulling, RMA, am acting as scribe for Sarina Ser, MD . Documentation: I have reviewed the above documentation for accuracy and completeness, and I agree with the above.  Sarina Ser, MD

## 2021-05-08 NOTE — Patient Instructions (Addendum)
If You Need Anything After Your Visit ° °If you have any questions or concerns for your doctor, please call our main line at [phone] and press option 4 to reach your doctor's medical assistant. If no one answers, please leave a voicemail as directed and we will return your call as soon as possible. Messages left after 4 pm will be answered the following business day.  ° °You may also send us a message via MyChart. We typically respond to MyChart messages within 1-2 business days. ° °For prescription refills, please ask your pharmacy to contact our office. Our fax number is [phone]. ° °If you have an urgent issue when the clinic is closed that cannot wait until the next business day, you can page your doctor at the number below.   ° °Please note that while we do our best to be available for urgent issues outside of office hours, we are not available 24/7.  ° °If you have an urgent issue and are unable to reach us, you may choose to seek medical care at your doctor's office, retail clinic, urgent care center, or emergency room. ° °If you have a medical emergency, please immediately call 911 or go to the emergency department. ° °Pager Numbers ° °- Dr. Kowalski: [phone] ° °- Dr. Moye: [phone] ° °- Dr. Stewart: [phone] ° °In the event of inclement weather, please call our main line at [phone] for an update on the status of any delays or closures. ° °Dermatology Medication Tips: °Please keep the boxes that topical medications come in in order to help keep track of the instructions about where and how to use these. Pharmacies typically print the medication instructions only on the boxes and not directly on the medication tubes.  ° °If your medication is too expensive, please contact our office at [phone] option 4 or send us a message through MyChart.  ° °We are unable to tell what your co-pay for medications will be in advance as this is different depending on your insurance coverage.  However, we may be able to find a substitute medication at lower cost or fill out paperwork to get insurance to cover a needed medication.  ° °If a prior authorization is required to get your medication covered by your insurance company, please allow us 1-2 business days to complete this process. ° °Drug prices often vary depending on where the prescription is filled and some pharmacies may offer cheaper prices. ° °The website www.goodrx.com contains coupons for medications through different pharmacies. The prices here do not account for what the cost may be with help from insurance (it may be cheaper with your insurance), but the website can give you the price if you did not use any insurance.  °- You can print the associated coupon and take it with your prescription to the pharmacy.  °- You may also stop by our office during regular business hours and pick up a GoodRx coupon card.  °- If you need your prescription sent electronically to a different pharmacy, notify our office through [facility] MyChart or by phone at [phone] option 4. ° ° ° ° °Si Usted Necesita Algo Después de Su Visita ° °También puede enviarnos un mensaje a través de MyChart. Por lo general respondemos a los mensajes de MyChart en el transcurso de 1 a 2 días hábiles. ° °Para renovar recetas, por favor pida a su farmacia que se ponga en contacto con nuestra oficina. Nuestro número de fax es el [phone]. ° °Si tiene   un asunto urgente cuando la clnica est cerrada y que no puede esperar hasta el siguiente da hbil, puede llamar/localizar a su doctor(a) al nmero que aparece a continuacin.   Por favor, tenga en cuenta que aunque hacemos todo lo posible para estar disponibles para asuntos urgentes fuera del horario de [place], no estamos disponibles las 24 horas del da, los 7 das de la [place].   Si tiene un problema urgente y no puede comunicarse con nosotros, puede optar por buscar atencin mdica  en el consultorio de su  doctor(a), en una clnica privada, en un centro de atencin urgente o en una sala de emergencias.  Si tiene Engineering geologist, por favor llame inmediatamente al 911 o vaya a la sala de emergencias.  Nmeros de bper  - Dr. Nehemiah Massed: [phone]  - Dra. Moye: [phone]  - Dra. Nicole Kindred: [phone]  En caso de inclemencias del [place], por favor llame a Johnsie Kindred principal al [phone] para una actualizacin sobre el [place] de cualquier retraso o cierre.  Consejos para la medicacin en dermatologa: Por favor, guarde las cajas en las que vienen los medicamentos de uso tpico para ayudarle a seguir las instrucciones sobre dnde y cmo usarlos. Las farmacias generalmente imprimen las instrucciones del medicamento slo en las cajas y no directamente en los tubos del [place].   Si su medicamento es muy caro, por favor, pngase en contacto con Zigmund Daniel llamando al [phone] y presione la opcin 4 o envenos un mensaje a travs de Pharmacist, community.   No podemos decirle cul ser su copago por los medicamentos por adelantado ya que esto es diferente dependiendo de la cobertura de su seguro. Sin embargo, es posible que podamos encontrar un medicamento sustituto a Electrical engineer un formulario para que el seguro cubra el medicamento que se considera necesario.   Si se requiere una autorizacin previa para que su compaa de seguros [place] su medicamento, por favor permtanos de 1 a 2 das hbiles para completar este proceso.  Los precios de los medicamentos varan con frecuencia dependiendo del Environmental consultant de dnde se surte la receta y alguna farmacias pueden ofrecer precios ms baratos.  El sitio web www.goodrx.com tiene cupones para medicamentos de Airline pilot. Los precios aqu no tienen en cuenta lo que podra costar con la ayuda del seguro (puede ser ms barato con su seguro), pero el sitio web puede darle el precio si no utiliz Research scientist (physical sciences).  - Puede imprimir el cupn  correspondiente y llevarlo con su receta a la farmacia.  - Tambin puede pasar por nuestra oficina durante el horario de atencin regular y Charity fundraiser una tarjeta de cupones de GoodRx.  - Si necesita que su receta se enve electrnicamente a una farmacia diferente, informe a nuestra oficina a travs de MyChart de [place] o por telfono llamando al [phone] y presione la opcin 4.  Cryotherapy Aftercare  Wash gently with soap and water everyday.   Apply Vaseline and Band-Aid daily until healed.   Actinic Keratosis  What is an actinic keratosis? An actinic keratosis (plural: actinic keratoses) is growth on the surface of the skin that usually appears as a red, hard, crusty or scaly bump.   What causes actinic keratoses? Repeated prolonged sun exposure causes skin damage, especially in fair-skinned persons. Sun-damaged skin becomes dry and wrinkled and may form rough, scaly spots called actinic keratoses. These rough spots remain on the skin even though the crust or scale on top is picked off.   Why treat  actinic keratoses? Actinic keratoses are not skin cancers, but because they may sometimes turn cancerous they are called pre-cancerous. Not all will turn to skin cancer, and it usually takes several years for this to happen. Because it is much easier to treat an actinic keratosis then it is to remove a skin cancer, actinic keratoses should be treated to prevent future skin cancer.   How are actinic keratoses treated? The most common way of treating actinic keratoses is to freeze them with liquid nitrogen. Freezing causes scabbing and shedding of the sun-damaged skin. Healing after a removal usually takes two weeks, depending on the size and location of the keratosis. Hands and legs heal more slowly than the face. The skins final appearance is usually excellent. There are several topical medications that can be used to treat actinic keratoses. These medications generally have side effects  of redness, crusting, and pain. Some are used for a few days, and some for several months before the actinic keratosis is completely gone. Photodynamic therapy is another alternative to freezing actinic keratoses. This treatment is done in a physicians office. A medication is applied to the area of skin with actinic keratoses, and it is allowed to soak in for one or more hours. A special light is then applied to the skin. Side effects include redness, burning, and peeling.  How can you prevent actinic keratoses? Protection from the sun is the best way to prevent actinic keratoses. The use of proper clothing and sunscreens can prevent the sun damage that leads to an actinic keratosis.  Unfortunately, some sun damage is permanent. Once sun damage has progressed to the point where actinic keratoses develop, new keratoses may appear even without further sun exposure. However, even in skin that is already heavily sun damaged, good sun protection can help reduce the number of actinic keratoses that will appear.

## 2021-05-09 DIAGNOSIS — R2681 Unsteadiness on feet: Secondary | ICD-10-CM | POA: Diagnosis not present

## 2021-05-09 DIAGNOSIS — R2689 Other abnormalities of gait and mobility: Secondary | ICD-10-CM | POA: Diagnosis not present

## 2021-05-09 DIAGNOSIS — M5459 Other low back pain: Secondary | ICD-10-CM | POA: Diagnosis not present

## 2021-05-09 DIAGNOSIS — G20 Parkinson's disease: Secondary | ICD-10-CM | POA: Diagnosis not present

## 2021-05-09 DIAGNOSIS — R262 Difficulty in walking, not elsewhere classified: Secondary | ICD-10-CM | POA: Diagnosis not present

## 2021-05-12 ENCOUNTER — Encounter: Payer: Self-pay | Admitting: Dermatology

## 2021-05-14 DIAGNOSIS — R2681 Unsteadiness on feet: Secondary | ICD-10-CM | POA: Diagnosis not present

## 2021-05-14 DIAGNOSIS — M5459 Other low back pain: Secondary | ICD-10-CM | POA: Diagnosis not present

## 2021-05-14 DIAGNOSIS — R2689 Other abnormalities of gait and mobility: Secondary | ICD-10-CM | POA: Diagnosis not present

## 2021-05-14 DIAGNOSIS — R262 Difficulty in walking, not elsewhere classified: Secondary | ICD-10-CM | POA: Diagnosis not present

## 2021-05-14 DIAGNOSIS — G20 Parkinson's disease: Secondary | ICD-10-CM | POA: Diagnosis not present

## 2021-05-15 ENCOUNTER — Telehealth: Payer: Self-pay

## 2021-05-15 ENCOUNTER — Other Ambulatory Visit: Payer: Self-pay

## 2021-05-15 DIAGNOSIS — D239 Other benign neoplasm of skin, unspecified: Secondary | ICD-10-CM

## 2021-05-15 DIAGNOSIS — M47816 Spondylosis without myelopathy or radiculopathy, lumbar region: Secondary | ICD-10-CM | POA: Diagnosis not present

## 2021-05-15 HISTORY — DX: Other benign neoplasm of skin, unspecified: D23.9

## 2021-05-15 MED ORDER — MUPIROCIN 2 % EX OINT: 1.0000 "application " | TOPICAL_OINTMENT | Freq: Every day | CUTANEOUS | 0 refills | Status: DC

## 2021-05-15 NOTE — Telephone Encounter (Signed)
Pts wife called and left vm stating that pts bx site is red, oozing, and bleeding some. She was unsure if pt had scratched the area. Wife states that pt has an appt this afternoon to get an injection in his back and would not be able to be seen for a few days. Please advise.

## 2021-05-15 NOTE — Telephone Encounter (Signed)
Patient advised of information per Dr. Nehemiah Massed and Loma Sousa. Wife states area looks better today. They will hold off on picking up Mupirocin Ointment.

## 2021-05-16 DIAGNOSIS — R2689 Other abnormalities of gait and mobility: Secondary | ICD-10-CM | POA: Diagnosis not present

## 2021-05-16 DIAGNOSIS — M5459 Other low back pain: Secondary | ICD-10-CM | POA: Diagnosis not present

## 2021-05-16 DIAGNOSIS — R262 Difficulty in walking, not elsewhere classified: Secondary | ICD-10-CM | POA: Diagnosis not present

## 2021-05-16 DIAGNOSIS — G20 Parkinson's disease: Secondary | ICD-10-CM | POA: Diagnosis not present

## 2021-05-16 DIAGNOSIS — R2681 Unsteadiness on feet: Secondary | ICD-10-CM | POA: Diagnosis not present

## 2021-05-17 ENCOUNTER — Other Ambulatory Visit: Payer: Self-pay | Admitting: Neurology

## 2021-05-17 DIAGNOSIS — G20 Parkinson's disease: Secondary | ICD-10-CM

## 2021-05-17 LAB — ANATOMIC PATHOLOGY REPORT

## 2021-05-20 ENCOUNTER — Telehealth: Payer: Self-pay

## 2021-05-20 NOTE — Telephone Encounter (Signed)
Discussed biopsy results with pt wife, pt wife will have pt to call back and schedule surgery

## 2021-05-21 ENCOUNTER — Other Ambulatory Visit: Payer: Self-pay | Admitting: Nurse Practitioner

## 2021-05-21 DIAGNOSIS — R262 Difficulty in walking, not elsewhere classified: Secondary | ICD-10-CM | POA: Diagnosis not present

## 2021-05-21 DIAGNOSIS — R2689 Other abnormalities of gait and mobility: Secondary | ICD-10-CM | POA: Diagnosis not present

## 2021-05-21 DIAGNOSIS — G20 Parkinson's disease: Secondary | ICD-10-CM | POA: Diagnosis not present

## 2021-05-21 DIAGNOSIS — M5459 Other low back pain: Secondary | ICD-10-CM | POA: Diagnosis not present

## 2021-05-21 DIAGNOSIS — R2681 Unsteadiness on feet: Secondary | ICD-10-CM | POA: Diagnosis not present

## 2021-05-22 NOTE — Telephone Encounter (Signed)
Requested Prescriptions  Pending Prescriptions Disp Refills   traZODone (DESYREL) 50 MG tablet [Pharmacy Med Name: TRAZODONE HYDROCHLORIDE 50 MG Tablet] 90 tablet 0    Sig: TAKE 1/2 TO 1 TABLET AT BEDTIME AS NEEDED FOR SLEEP     Psychiatry: Antidepressants - Serotonin Modulator Passed - 05/21/2021  6:56 PM      Passed - Valid encounter within last 6 months    Recent Outpatient Visits          2 months ago Inability to walk   [facility], [place] P, DO   4 months ago Primary hypertension   [place], Megan P, DO   6 months ago Benign prostatic hyperplasia with urinary frequency   [place], DO   7 months ago Activated protein C resistance ([place])   [place], Megan P, DO   8 months ago Preop exam for internal medicine   [place], Megan P, DO      Future Appointments            In 1 month Johnson, Barb Merino, DO [facility], [place]   In 1 month  [employer] [facility], [place]   In 11 months Nehemiah Massed, Monia Sabal, MD [place]

## 2021-05-23 DIAGNOSIS — M5459 Other low back pain: Secondary | ICD-10-CM | POA: Diagnosis not present

## 2021-05-23 DIAGNOSIS — G20 Parkinson's disease: Secondary | ICD-10-CM | POA: Diagnosis not present

## 2021-05-23 DIAGNOSIS — R262 Difficulty in walking, not elsewhere classified: Secondary | ICD-10-CM | POA: Diagnosis not present

## 2021-05-23 DIAGNOSIS — R2689 Other abnormalities of gait and mobility: Secondary | ICD-10-CM | POA: Diagnosis not present

## 2021-05-23 DIAGNOSIS — R2681 Unsteadiness on feet: Secondary | ICD-10-CM | POA: Diagnosis not present

## 2021-05-30 DIAGNOSIS — M47816 Spondylosis without myelopathy or radiculopathy, lumbar region: Secondary | ICD-10-CM | POA: Diagnosis not present

## 2021-06-12 DIAGNOSIS — M47816 Spondylosis without myelopathy or radiculopathy, lumbar region: Secondary | ICD-10-CM | POA: Diagnosis not present

## 2021-07-05 ENCOUNTER — Telehealth: Payer: Self-pay | Admitting: Neurology

## 2021-07-05 ENCOUNTER — Other Ambulatory Visit: Payer: Self-pay

## 2021-07-05 MED ORDER — CARBIDOPA-LEVODOPA ER 50-200 MG PO TBCR: 1.0000 | EXTENDED_RELEASE_TABLET | Freq: Every day | ORAL | 0 refills | Status: DC

## 2021-07-05 NOTE — Telephone Encounter (Signed)
Prescription has been sent and I let wife know  ?

## 2021-07-05 NOTE — Telephone Encounter (Signed)
Patient's wife Thomas [name] called and said the patient needs a new Rx for carbidopa levodopa ER 50-200 sent to a new pharmacy for this medication only to: ? ?[name]  ?[place], [place]  ?[phone] ?

## 2021-07-06 ENCOUNTER — Other Ambulatory Visit: Payer: Self-pay | Admitting: Neurology

## 2021-07-09 ENCOUNTER — Other Ambulatory Visit: Payer: Self-pay | Admitting: Neurology

## 2021-07-09 ENCOUNTER — Encounter: Payer: Medicare HMO | Admitting: Family Medicine

## 2021-07-09 DIAGNOSIS — G20 Parkinson's disease: Secondary | ICD-10-CM

## 2021-07-10 ENCOUNTER — Encounter: Payer: Medicare HMO | Admitting: Family Medicine

## 2021-07-10 DIAGNOSIS — M5416 Radiculopathy, lumbar region: Secondary | ICD-10-CM | POA: Diagnosis not present

## 2021-07-10 DIAGNOSIS — M5136 Other intervertebral disc degeneration, lumbar region: Secondary | ICD-10-CM | POA: Diagnosis not present

## 2021-07-10 DIAGNOSIS — M5126 Other intervertebral disc displacement, lumbar region: Secondary | ICD-10-CM | POA: Diagnosis not present

## 2021-07-10 DIAGNOSIS — M47816 Spondylosis without myelopathy or radiculopathy, lumbar region: Secondary | ICD-10-CM | POA: Diagnosis not present

## 2021-07-12 ENCOUNTER — Other Ambulatory Visit: Payer: Self-pay

## 2021-07-12 ENCOUNTER — Telehealth: Payer: Self-pay | Admitting: Neurology

## 2021-07-12 DIAGNOSIS — G20 Parkinson's disease: Secondary | ICD-10-CM

## 2021-07-12 MED ORDER — ROPINIROLE HCL 3 MG PO TABS: ORAL_TABLET | ORAL | 0 refills | Status: DC

## 2021-07-12 NOTE — Telephone Encounter (Signed)
Patients wife called, said Thomas [name] needs a refill on his ropinirole '3mg'$  not '2mg'$ . Said [facility]. And they need it expedited so he can get them quickly  ?

## 2021-07-12 NOTE — Telephone Encounter (Signed)
Called patients wife and sent prescription in to [facility]  ?

## 2021-07-15 ENCOUNTER — Ambulatory Visit (INDEPENDENT_AMBULATORY_CARE_PROVIDER_SITE_OTHER): Payer: Medicare HMO | Admitting: *Deleted

## 2021-07-15 VITALS — BP 160/62

## 2021-07-15 DIAGNOSIS — Z0000 Encounter for general adult medical examination without abnormal findings: Secondary | ICD-10-CM

## 2021-07-15 NOTE — Progress Notes (Signed)
? ?Subjective:  ? Thomas [name] is a 71 y.o. male who presents for Medicare Annual/Subsequent preventive examination. ? ?I connected with  Thomas [name] on 07/15/21 by a telephone enabled telemedicine application and verified that I am speaking with the correct person using two identifiers. ?  ?I discussed the limitations of evaluation and management by telemedicine. The patient expressed understanding and agreed to proceed. ? ?Patient location: home ? ?Provider location: Tele-Health not in office ? ? ? ?Review of Systems    ? ?Cardiac Risk Factors include: advanced age (>64mn, >>40women);hypertension;male gender;obesity (BMI >30kg/m2) ? ?   ?Objective:  ?  ?Today's Vitals  ? 07/15/21 1127  ?BP: (!) 160/62  ?PainSc: 4   ? ?There is no height or weight on file to calculate BMI. ? ? ?  07/15/2021  ? 11:30 AM 04/02/2021  ?  8:44 AM 10/08/2020  ? 11:11 AM 07/13/2020  ?  1:02 PM 06/11/2020  ?  8:17 AM 06/05/2020  ?  2:34 PM 12/13/2019  ? 10:54 AM  ?Advanced Directives  ?Does Patient Have a Medical Advance Directive? Yes Yes Yes Yes Yes Yes Yes  ?Type of AParamed[place]of [place] will Living will [place] will [place]Wal[place]Living will [place]West End[place]Cobb TownLiving will;Out of facility DNR (pink MOST or yellow form) [place]irc[place]Living will   ?Copy of [place] Chart? Yes - validated most recent copy scanned in chart (See row information)   Yes - validated most recent copy scanned in chart (See row information)     ? ? ?Current Medications (verified) ?Outpatient Encounter Medications as of 07/15/2021  ?Medication Sig  ? acetaminophen (TYLENOL) 500 MG tablet Take 1,000 mg by mouth daily.  ? amantadine (SYMMETREL) 100 MG capsule TAKE 1 CAPSULE BY MOUTH THREE TIMES DAILY  ? amLODipine (NORVASC) 2.5 MG tablet Take 2.5 mg by mouth daily as needed (BP >160/90). Take in the morning  ? calcium carbonate (TUMS - DOSED IN MG  ELEMENTAL CALCIUM) 500 MG chewable tablet Chew 1 tablet by mouth daily as needed for indigestion or heartburn.  ? carbidopa-levodopa (SINEMET CR) 50-200 MG tablet Take 1 tablet by mouth at bedtime.  ? carbidopa-levodopa (SINEMET IR) 25-100 MG tablet TAKE 2 TABLETS FOUR TIMES DAILY - AT 6AM, 10AM, 2PM, AND 6PM  ? cetirizine (ZYRTEC) 10 MG tablet Take 10 mg by mouth at bedtime.  ? cholecalciferol (VITAMIN D3) 25 MCG (1000 UNIT) tablet Take 1,000 Units by mouth daily.  ? diclofenac Sodium (VOLTAREN) 1 % GEL APPLY TOPICALLY 4 GRAMS FOUR TIMES DAILY (Patient taking differently: Apply 4 g topically 4 (four) times daily as needed (pain).)  ? hydrALAZINE (APRESOLINE) 25 MG tablet Take 1 tablet (25 mg total) by mouth 3 (three) times daily as needed (for SBP >160). Takes in the evening  ? Insulin Pen Needle (PEN NEEDLES) 30G X 5 MM MISC 1 each by Does not apply route daily.  ? lisinopril (ZESTRIL) 20 MG tablet Take 1 tablet (20 mg total) by mouth daily.  ? melatonin 5 MG TABS Take 5 mg by mouth at bedtime.  ? methocarbamol (ROBAXIN) 500 MG tablet Take 1 tablet (500 mg total) by mouth every 6 (six) hours as needed for muscle spasms.  ? metoprolol succinate (TOPROL-XL) 25 MG 24 hr tablet TAKE 1 TABLET (25 MG TOTAL) BY MOUTH DAILY. (Patient taking differently: Take 25 mg by mouth at bedtime.)  ? Multiple Vitamin (MULTIVITAMIN) tablet Take 1 tablet  by mouth daily at 6 PM.  ? polyethylene glycol powder (GLYCOLAX/MIRALAX) 17 GM/SCOOP powder Take 17 g by mouth 2 (two) times daily as needed. (Patient taking differently: Take 17 g by mouth at bedtime.)  ? PREVIDENT 5000 BOOSTER PLUS 1.1 % PSTE Place 1 application onto teeth 2 (two) times daily.  ? rivaroxaban (XARELTO) 20 MG TABS tablet Take 1 tablet (20 mg total) by mouth daily.  ? rOPINIRole (REQUIP) 3 MG tablet TAKE 1 TABLET(3 MG) BY MOUTH THREE TIMES DAILY  ? simvastatin (ZOCOR) 40 MG tablet Take 1 tablet (40 mg total) by mouth daily.  ? tamsulosin (FLOMAX) 0.4 MG CAPS capsule  Take 1 capsule (0.4 mg total) by mouth daily.  ? traMADol (ULTRAM) 50 MG tablet Take 50 mg by mouth at bedtime.  ? traZODone (DESYREL) 50 MG tablet TAKE 1/2 TO 1 TABLET AT BEDTIME AS NEEDED FOR SLEEP  ? HYDROcodone-acetaminophen (NORCO/VICODIN) 5-325 MG tablet Take 0.5-1 tablets by mouth every 4 (four) hours as needed for moderate pain. (Patient not taking: Reported on 07/15/2021)  ? mupirocin ointment (BACTROBAN) 2 % Apply 1 application topically daily. Apply with dressing changes (Patient not taking: Reported on 07/15/2021)  ? SYSTANE ULTRA 0.4-0.3 % SOLN Place 1 drop into both eyes daily as needed (dry eyes). (Patient not taking: Reported on 07/15/2021)  ? ?No facility-administered encounter medications on file as of 07/15/2021.  ? ? ?Allergies (verified) ?Codeine and Other  ? ?History: ?Past Medical History:  ?Diagnosis Date  ? AF (atrial fibrillation) ([place])   ? Allergy   ? Arthritis   ? Osteoarthritis  ? Arthritis of knee   ? Atrial fibrillation ([place])   ? DVT (deep venous thrombosis) ([place])   ? L leg  ? Dysplastic nevus 05/15/2021  ? Left Superior Pectoral, mod to severe, schedule surgery  ? Dysrhythmia   ? History of kidney stones   ? Hyperchloremia   ? Hypercholesterolemia   ? Hypertension   ? Nephrolithiasis   ? Parkinson's disease ([place])   ? Plantar wart   ? Pneumonia   ? PONV (postoperative nausea and vomiting)   ? Pulmonary embolism ([place])   ? Sciatica   ? Seasonal allergies   ? Stroke [facility])   ? TIA (transient ischemic attack)   ? ?Past Surgical History:  ?Procedure Laterality Date  ? APPENDECTOMY    ? CATARACT EXTRACTION Bilateral 10/2019 11/2019  ? COLONOSCOPY WITH PROPOFOL N/A 07/27/2019  ? Procedure: COLONOSCOPY WITH PROPOFOL;  Surgeon: Lin Landsman, MD;  Location: [facility] ENDOSCOPY;  Service: Endoscopy;  Laterality: N/A;  ? EYE SURGERY    ? KNEE ARTHROSCOPY WITH MENISCAL REPAIR Right 2009  ? KNEE SURGERY Right   ? LUMBAR LAMINECTOMY/DECOMPRESSION MICRODISCECTOMY Left 10/11/2020  ? Procedure: Left Lumbar  Three-Four, Lumbar Fou-Five  Microdiscectomy;  Surgeon: Erline Levine, MD;  Location: [place];  Service: Neurosurgery;  Laterality: Left;  ? T&A    ? TONSILLECTOMY    ? ?Family History  ?Problem Relation Age of Onset  ? Parkinson's disease Father   ? Coronary artery disease Mother   ? Heart attack Sister   ? Deep vein thrombosis Sister   ? ?Social History  ? ?Socioeconomic History  ? Marital status: Married  ?  Spouse name: [name]  ? Number of children: 0  ? Years of education: Not on file  ? Highest education level: Some college, no degree  ?Occupational History  ? 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 Smoking status: Never  ?  Smokeless tobacco: Never  ?Vaping Use  ? Vaping Use: Never used  ?Substance and Sexual Activity  ? Alcohol use: No  ?  Alcohol/week: 0.0 standard drinks  ? Drug use: No  ? Sexual activity: Not Currently  ?Other Topics Concern  ? Not on file  ?Social History Narrative  ? ** Merged History Encounter **  ?  right handed  ? Two story home   ? Live with spouse  ? ?Social Determinants of Health  ? ?Financial Resource Strain: Low Risk   ? Difficulty of Paying Living Expenses: Not hard at all  ?Food Insecurity: No Food Insecurity  ? Worried About Charity fundraiser in the Last Year: Never true  ? Ran Out of Food in the Last Year: Never true  ?Transportation Needs: No Transportation Needs  ? Lack of Transportation (Medical): No  ? Lack of Transportation (Non-Medical): No  ?Physical Activity: Insufficiently Active  ? Days of Exercise per Week: 3 days  ? Minutes of Exercise per Session: 40 min  ?Stress: No Stress Concern Present  ? Feeling of Stress : Not at all  ?Social Connections: Socially Integrated  ? Frequency of Communication with Friends and Family: Twice a week  ? Frequency of Social Gatherings with Friends and Family: Twice a week  ? Attends Religious Services: More than 4 times per year  ? Active Member of Clubs or Organizations: Yes  ? Attends Archivist  Meetings: More than 4 times per year  ? Marital Status: Married  ? ? ?Tobacco Counseling ?Counseling given: Not Answered ? ? ?Clinical Intake: ? ?Pre-visit preparation completed: Yes ? ?Pain : 0-10 ?Pain Score: 4  ?Pa

## 2021-07-15 NOTE — Patient Instructions (Signed)
Thomas [name] , ?Thank you for taking time to come for your Medicare Wellness Visit. I appreciate your ongoing commitment to your health goals. Please review the following plan we discussed and let me know if I can assist you in the future.  ? ?Screening recommendations/referrals: ?Colonoscopy: up to date ?Recommended yearly ophthalmology/optometry visit for glaucoma screening and checkup ?Recommended yearly dental visit for hygiene and checkup ? ?Vaccinations: ?Influenza vaccine: up to date ?Pneumococcal vaccine: up to date ?Tdap vaccine: up to date ?Shingles vaccine: Education provided   ? ?Advanced directives: on file ? ?Conditions/risks identified:  ? ?Next appointment: 07-18-2021 @ 2:00  Thomas [name] ? ?Preventive Care 50 Years and Older, Male ?Preventive care refers to lifestyle choices and visits with your health care provider that can promote health and wellness. ?What does preventive care include? ?A yearly physical exam. This is also called an annual well check. ?Dental exams once or twice a year. ?Routine eye exams. Ask your health care provider how often you should have your eyes checked. ?Personal lifestyle choices, including: ?Daily care of your teeth and gums. ?Regular physical activity. ?Eating a healthy diet. ?Avoiding tobacco and drug use. ?Limiting alcohol use. ?Practicing safe sex. ?Taking low doses of aspirin every day. ?Taking vitamin and mineral supplements as recommended by your health care provider. ?What happens during an annual well check? ?The services and screenings done by your health care provider during your annual well check will depend on your age, overall health, lifestyle risk factors, and family history of disease. ?Counseling  ?Your health care provider may ask you questions about your: ?Alcohol use. ?Tobacco use. ?Drug use. ?Emotional well-being. ?Home and relationship well-being. ?Sexual activity. ?Eating habits. ?History of falls. ?Memory and ability to understand (cognition). ?Work and  work Statistician. ?Screening  ?You may have the following tests or measurements: ?Height, weight, and BMI. ?Blood pressure. ?Lipid and cholesterol levels. These may be checked every 5 years, or more frequently if you are over 73 years old. ?Skin check. ?Lung cancer screening. You may have this screening every year starting at age 43 if you have a 30-pack-year history of smoking and currently smoke or have quit within the past 15 years. ?Fecal occult blood test (FOBT) of the stool. You may have this test every year starting at age 50. ?Flexible sigmoidoscopy or colonoscopy. You may have a sigmoidoscopy every 5 years or a colonoscopy every 10 years starting at age 48. ?Prostate cancer screening. Recommendations will vary depending on your family history and other risks. ?Hepatitis C blood test. ?Hepatitis B blood test. ?Sexually transmitted disease (STD) testing. ?Diabetes screening. This is done by checking your blood sugar (glucose) after you have not eaten for a while (fasting). You may have this done every 1-3 years. ?Abdominal aortic aneurysm (AAA) screening. You may need this if you are a current or former smoker. ?Osteoporosis. You may be screened starting at age 57 if you are at high risk. ?Talk with your health care provider about your test results, treatment options, and if necessary, the need for more tests. ?Vaccines  ?Your health care provider may recommend certain vaccines, such as: ?Influenza vaccine. This is recommended every year. ?Tetanus, diphtheria, and acellular pertussis (Tdap, Td) vaccine. You may need a Td booster every 10 years. ?Zoster vaccine. You may need this after age 78. ?Pneumococcal 13-valent conjugate (PCV13) vaccine. One dose is recommended after age 36. ?Pneumococcal polysaccharide (PPSV23) vaccine. One dose is recommended after age 11. ?Talk to your health care provider about which screenings and  vaccines you need and how often you need them. ?This information is not intended to  replace advice given to you by your health care provider. Make sure you discuss any questions you have with your health care provider. ?Document Released: 04/27/2015 Document Revised: 12/19/2015 Document Reviewed: 01/30/2015 ?Elsevier Interactive Patient Education ? 2017 [place]. ? ?Fall Prevention in the Home ?Falls can cause injuries. They can happen to people of all ages. There are many things you can do to make your home safe and to help prevent falls. ?What can I do on the outside of my home? ?Regularly fix the edges of walkways and driveways and fix any cracks. ?Remove anything that might make you trip as you walk through a door, such as a raised step or threshold. ?Trim any bushes or trees on the path to your home. ?Use bright outdoor lighting. ?Clear any walking paths of anything that might make someone trip, such as rocks or tools. ?Regularly check to see if handrails are loose or broken. Make sure that both sides of any steps have handrails. ?Any raised decks and porches should have guardrails on the edges. ?Have any leaves, snow, or ice cleared regularly. ?Use sand or salt on walking paths during winter. ?Clean up any spills in your garage right away. This includes oil or grease spills. ?What can I do in the bathroom? ?Use night lights. ?Install grab bars by the toilet and in the tub and shower. Do not use towel bars as grab bars. ?Use non-skid mats or decals in the tub or shower. ?If you need to sit down in the shower, use a plastic, non-slip stool. ?Keep the floor dry. Clean up any water that spills on the floor as soon as it happens. ?Remove soap buildup in the tub or shower regularly. ?Attach bath mats securely with double-sided non-slip rug tape. ?Do not have throw rugs and other things on the floor that can make you trip. ?What can I do in the bedroom? ?Use night lights. ?Make sure that you have a light by your bed that is easy to reach. ?Do not use any sheets or blankets that are too big for  your bed. They should not hang down onto the floor. ?Have a firm chair that has side arms. You can use this for support while you get dressed. ?Do not have throw rugs and other things on the floor that can make you trip. ?What can I do in the kitchen? ?Clean up any spills right away. ?Avoid walking on wet floors. ?Keep items that you use a lot in easy-to-reach places. ?If you need to reach something above you, use a strong step stool that has a grab bar. ?Keep electrical cords out of the way. ?Do not use floor polish or wax that makes floors slippery. If you must use wax, use non-skid floor wax. ?Do not have throw rugs and other things on the floor that can make you trip. ?What can I do with my stairs? ?Do not leave any items on the stairs. ?Make sure that there are handrails on both sides of the stairs and use them. Fix handrails that are broken or loose. Make sure that handrails are as long as the stairways. ?Check any carpeting to make sure that it is firmly attached to the stairs. Fix any carpet that is loose or worn. ?Avoid having throw rugs at the top or bottom of the stairs. If you do have throw rugs, attach them to the floor with carpet tape. ?Make  sure that you have a light switch at the top of the stairs and the bottom of the stairs. If you do not have them, ask someone to add them for you. ?What else can I do to help prevent falls? ?Wear shoes that: ?Do not have high heels. ?Have rubber bottoms. ?Are comfortable and fit you well. ?Are closed at the toe. Do not wear sandals. ?If you use a stepladder: ?Make sure that it is fully opened. Do not climb a closed stepladder. ?Make sure that both sides of the stepladder are locked into place. ?Ask someone to hold it for you, if possible. ?Clearly mark and make sure that you can see: ?Any grab bars or handrails. ?First and last steps. ?Where the edge of each step is. ?Use tools that help you move around (mobility aids) if they are needed. These  include: ?Canes. ?Walkers. ?Scooters. ?Crutches. ?Turn on the lights when you go into a dark area. Replace any light bulbs as soon as they burn out. ?Set up your furniture so you have a clear path. Avoid moving your furni

## 2021-07-16 ENCOUNTER — Ambulatory Visit: Payer: Medicare HMO | Admitting: Dermatology

## 2021-07-16 ENCOUNTER — Telehealth: Payer: Self-pay

## 2021-07-16 ENCOUNTER — Encounter: Payer: Self-pay | Admitting: Dermatology

## 2021-07-16 DIAGNOSIS — D235 Other benign neoplasm of skin of trunk: Secondary | ICD-10-CM | POA: Diagnosis not present

## 2021-07-16 DIAGNOSIS — L905 Scar conditions and fibrosis of skin: Secondary | ICD-10-CM | POA: Diagnosis not present

## 2021-07-16 DIAGNOSIS — D492 Neoplasm of unspecified behavior of bone, soft tissue, and skin: Secondary | ICD-10-CM

## 2021-07-16 HISTORY — PX: OTHER SURGICAL HISTORY: SHX169

## 2021-07-16 MED ORDER — MUPIROCIN 2 % EX OINT: 1.0000 "application " | TOPICAL_OINTMENT | Freq: Every day | CUTANEOUS | 0 refills | Status: DC

## 2021-07-16 NOTE — Progress Notes (Signed)
? ?  Follow-Up Visit ?  ?Subjective  ?Thomas [name] is a 71 y.o. male who presents for the following: Moderate to Severe dysplastic nevus bx proven (L sup pectoral, pt presents for excision). ? ?Accompanied by wife who contributes to history. ? ?The following portions of the chart were reviewed this encounter and updated as appropriate:  ? Tobacco  Allergies  Meds  Problems  Med Hx  Surg Hx  Fam Hx   ?  ?Review of Systems:  No other skin or systemic complaints except as noted in HPI or Assessment and Plan. ? ?Objective  ?Well appearing patient in no apparent distress; mood and affect are within normal limits. ? ?A focused examination was performed including chest. Relevant physical exam findings are noted in the Assessment and Plan. ? ?L sup pectoral ?Pink bx site 1.0 x 0.8 cm ? ? ?Assessment & Plan  ?Neoplasm of skin ?L sup pectoral ? ?Skin excision ? ?Lesion length (cm):  1 ?Lesion width (cm):  0.8 ?Margin per side (cm):  0.2 ?Total excision diameter (cm):  1.4 ?Informed consent: discussed and consent obtained   ?Timeout: patient name, date of birth, surgical site, and procedure verified   ?Procedure prep:  Patient was prepped and draped in usual sterile fashion ?Prep type:  Isopropyl alcohol and povidone-iodine ?Anesthesia: the lesion was anesthetized in a standard fashion   ?Anesthetic:  1% lidocaine w/ epinephrine 1-100,000 buffered w/ 8.4% NaHCO3 ?Instrument used: #15 blade   ?Hemostasis achieved with: pressure   ?Hemostasis achieved with comment:  Electrocautery ?Outcome: patient tolerated procedure well with no complications   ?Post-procedure details: sterile dressing applied and wound care instructions given   ?Dressing type: bandage and pressure dressing (Mupirocin)   ? ?Skin repair ?Complexity:  Complex ?Final length (cm):  4 ?Reason for type of repair: reduce tension to allow closure, reduce the risk of dehiscence, infection, and necrosis, reduce subcutaneous dead space and avoid a hematoma, allow  closure of the large defect, preserve normal anatomy, preserve normal anatomical and functional relationships and enhance both functionality and cosmetic results   ?Undermining: area extensively undermined   ?Undermining comment:  Undermining Defect 1.2 cm ?Subcutaneous layers (deep stitches):  ?Suture size:  3-0 ?Suture type: Vicryl (polyglactin 910)   ?Subcutaneous suture technique: Inverted Dermal. ?Fine/surface layer approximation (top stitches):  ?Suture size:  3-0 ?Stitches: simple running   ?Suture removal (days):  7 ?Hemostasis achieved with: pressure ?Outcome: patient tolerated procedure well with no complications   ?Post-procedure details: sterile dressing applied and wound care instructions given   ?Dressing type: bandage, pressure dressing and bacitracin (Mupirocin)   ? ?mupirocin ointment (BACTROBAN) 2 % ?Apply 1 application. topically daily. With dressing changes ? ?Bx proven moderate to severe dysplastic nevus ?Excised today ?Start Mupirocin oint qd ? ?Related Procedures ?Anatomic Pathology Report ? ? ?Return in about 1 week (around 07/23/2021) for suture removal. ? ?I, Ashok Cordia, CMA, am acting as scribe for Sarina Ser, MD . ?Documentation: I have reviewed the above documentation for accuracy and completeness, and I agree with the above. ? ?Sarina Ser, MD ? ?

## 2021-07-16 NOTE — Patient Instructions (Signed)
Wound Care Instructions ? ?On the day following your surgery, you should begin doing daily dressing changes: ?Remove the old dressing and discard it. ?Cleanse the wound gently with tap water. This may be done in the shower or by placing a wet gauze pad directly on the wound and letting it soak for several minutes. ?It is important to gently remove any dried blood from the wound in order to encourage healing. This may be done by gently rolling a moistened Q-tip on the dried blood. Do not pick at the wound. ?If the wound should start to bleed, continue cleaning the wound, then place a moist gauze pad on the wound and hold pressure for a few minutes.  ?Make sure you then dry the skin surrounding the wound completely or the tape will not stick to the skin. Do not use cotton balls on the wound. ?After the wound is clean and dry, apply the ointment gently with a Q-tip. ?Cut a non-stick pad to fit the size of the wound. Lay the pad flush to the wound. If the wound is draining, you may want to reinforce it with a small amount of gauze on top of the non-stick pad for a little added compression to the area. ?Use the tape to seal the area completely. ?Select from the following with respect to your individual situation: ?If your wound has been stitched closed: continue the above steps 1-8 at least daily until your sutures are removed. ?If your wound has been left open to heal: continue steps 1-8 at least daily for the first 3-4 weeks. ?We would like for you to take a few extra precautions for at least the next week. ?Sleep with your head elevated on pillows if our wound is on your head. ?Do not bend over or lift heavy items to reduce the chance of elevated blood pressure to the wound ?Do not participate in particularly strenuous activities. ? ? ?Below is a list of dressing supplies you might need.  ?Cotton-tipped applicators - Q-tips ?Gauze pads (2x2 and/or 4x4) - All-Purpose Sponges ?Non-stick dressing material - Telfa ?Tape -  Paper or Hypafix ?New and clean tube of petroleum jelly - Vaseline  ? ? ?Comments on Post-Operative Period ?Slight swelling and redness often appear around the wound. This is normal and will disappear within several days following the surgery. ?The healing wound will drain a brownish-red-yellow discharge during healing. This is a normal phase of wound healing. As the wound begins to heal, the drainage may increase in amount. Again, this drainage is normal. ?Notify us if the drainage becomes persistently bloody, excessively swollen, or intensely painful or develops a foul odor or red streaks.  ?If you should experience mild discomfort during the healing phase, you may take an aspirin-free medication such as Tylenol (acetaminophen). Notify us if the discomfort is severe or persistent. Avoid alcoholic beverages when taking pain medicine. ? ?In Case of Wound Hemorrhage ?A wound hemorrhage is when the bandage suddenly becomes soaked with bright red blood and flows profusely. If this happens, sit down or lie down with your head elevated. If the wound has a dressing on it, do not remove the dressing. Apply pressure to the existing gauze. If the wound is not covered, use a gauze pad to apply pressure and continue applying the pressure for 20 minutes without peeking. DO NOT COVER THE WOUND WITH A LARGE TOWEL OR WASH CLOTH. Release your hand from the wound site but do not remove the dressing. If the bleeding has stopped,   gently clean around the wound. Leave the dressing in place for 24 hours if possible. This wait time allows the blood vessels to close off so that you do not spark a new round of bleeding by disrupting the newly clotted blood vessels with an immediate dressing change. If the bleeding does not subside, continue to hold pressure. If matters are out of your control, contact an After Hours clinic or go to the Emergency Room. ? ? ? ?If You Need Anything After Your Visit ? ?If you have any questions or concerns for  your doctor, please call our main line at [phone] and press option 4 to reach your doctor's medical assistant. If no one answers, please leave a voicemail as directed and we will return your call as soon as possible. Messages left after 4 pm will be answered the following business day.  ? ?You may also send [place] a message via MyChart. We typically respond to MyChart messages within 1-2 business days. ? ?For prescription refills, please ask your pharmacy to contact our office. Our fax number is [phone]. ? ?If you have an urgent issue when the clinic is closed that cannot wait until the next business day, you can page your doctor at the number below.   ? ?Please note that while we do our best to be available for urgent issues outside of office hours, we are not available 24/7.  ? ?If you have an urgent issue and are unable to reach [place], you may choose to seek medical care at your doctor's office, retail clinic, urgent care center, or emergency room. ? ?If you have a medical emergency, please immediately call 911 or go to the emergency department. ? ?Pager Numbers ? ?- Dr. Nehemiah Massed: [phone] ? ?- Dr. Laurence Ferrari: [phone] ? ?- Dr. Nicole Kindred: [phone] ? ?In the event of inclement weather, please call our main line at [phone] for an update on the status of any delays or closures. ? ?Dermatology Medication Tips: ?Please keep the boxes that topical medications come in in order to help keep track of the instructions about where and how to use these. Pharmacies typically print the medication instructions only on the boxes and not directly on the medication tubes.  ? ?If your medication is too expensive, please contact our office at [phone] option 4 or send [place] a message through [place].  ? ?We are unable to tell what your co-pay for medications will be in advance as this is different depending on your insurance coverage. However, we may be able to find a substitute medication at lower cost or fill out  paperwork to get insurance to cover a needed medication.  ? ?If a prior authorization is required to get your medication covered by your insurance company, please allow [place] 1-2 business days to complete this process. ? ?Drug prices often vary depending on where the prescription is filled and some pharmacies may offer cheaper prices. ? ?The website www.goodrx.com contains coupons for medications through different pharmacies. The prices here do not account for what the cost may be with help from insurance (it may be cheaper with your insurance), but the website can give you the price if you did not use any insurance.  ?- You can print the associated coupon and take it with your prescription to the pharmacy.  ?- You may also stop by our office during regular business hours and pick up a GoodRx coupon card.  ?- If you need your prescription sent electronically to a different pharmacy, notify our  office through [facility] or by phone at [phone] option 4. ? ? ? ? ?Si Usted Necesita Algo Despu?s de Su Visita ? ?Tambi?n puede enviarnos un mensaje a trav?s de MyChart. Por lo general respondemos a los mensajes de MyChart en el transcurso de 1 a 2 d?as h?biles. ? ?Para renovar recetas, por favor pida a su farmacia que se ponga en contacto con nuestra oficina. Nuestro n?mero de fax es el [phone]. ? ?Si tiene un asunto urgente cuando la cl?nica est? cerrada y que no puede esperar hasta el siguiente d?a h?bil, puede llamar/localizar a su doctor(a) al n?mero que aparece a continuaci?n.  ? ?Por favor, tenga en cuenta que aunque hacemos todo lo posible para estar disponibles para asuntos urgentes fuera del horario de oficina, no estamos disponibles las 24 horas del d?a, los 7 d?as de la semana.  ? ?Si tiene un problema urgente y no puede comunicarse con nosotros, puede optar por buscar atenci?n m?dica  en el consultorio de su doctor(a), en una cl?nica privada, en un centro de atenci?n urgente o en una sala de  emergencias. ? ?Si tiene Engineer, maintenance (IT) m?dica, por favor llame inmediatamente al 911 o vaya a la sala de emergencias. ? ?N?meros de b?per ? ?- Dr. Nehemiah Massed: [phone] ? ?- Dra. Moye: [phone] ? ?- Dra

## 2021-07-16 NOTE — Telephone Encounter (Signed)
Left pt msg to call if any problems after today's surgery./sh °

## 2021-07-18 ENCOUNTER — Encounter: Payer: Self-pay | Admitting: Family Medicine

## 2021-07-18 ENCOUNTER — Other Ambulatory Visit: Payer: Self-pay | Admitting: Family Medicine

## 2021-07-18 ENCOUNTER — Ambulatory Visit (INDEPENDENT_AMBULATORY_CARE_PROVIDER_SITE_OTHER): Payer: Medicare HMO | Admitting: Family Medicine

## 2021-07-18 VITALS — BP 116/74 | HR 60 | Temp 97.4°F | Wt 226.4 lb

## 2021-07-18 DIAGNOSIS — N401 Enlarged prostate with lower urinary tract symptoms: Secondary | ICD-10-CM | POA: Diagnosis not present

## 2021-07-18 DIAGNOSIS — G4701 Insomnia due to medical condition: Secondary | ICD-10-CM | POA: Diagnosis not present

## 2021-07-18 DIAGNOSIS — G20 Parkinson's disease: Secondary | ICD-10-CM

## 2021-07-18 DIAGNOSIS — Z0000 Encounter for general adult medical examination without abnormal findings: Secondary | ICD-10-CM | POA: Diagnosis not present

## 2021-07-18 DIAGNOSIS — I10 Essential (primary) hypertension: Secondary | ICD-10-CM | POA: Diagnosis not present

## 2021-07-18 DIAGNOSIS — I480 Paroxysmal atrial fibrillation: Secondary | ICD-10-CM | POA: Diagnosis not present

## 2021-07-18 DIAGNOSIS — D6851 Activated protein C resistance: Secondary | ICD-10-CM

## 2021-07-18 DIAGNOSIS — E7800 Pure hypercholesterolemia, unspecified: Secondary | ICD-10-CM

## 2021-07-18 DIAGNOSIS — G20A1 Parkinson's disease without dyskinesia, without mention of fluctuations: Secondary | ICD-10-CM

## 2021-07-18 DIAGNOSIS — R350 Frequency of micturition: Secondary | ICD-10-CM

## 2021-07-18 MED ORDER — LISINOPRIL 20 MG PO TABS
20.0000 mg | ORAL_TABLET | Freq: Every day | ORAL | 1 refills | Status: DC
Start: 2021-07-18 — End: 2021-07-30

## 2021-07-18 MED ORDER — AMLODIPINE BESYLATE 2.5 MG PO TABS: 2.5000 mg | ORAL_TABLET | Freq: Every day | ORAL | 1 refills | Status: DC | PRN

## 2021-07-18 MED ORDER — SIMVASTATIN 40 MG PO TABS
40.0000 mg | ORAL_TABLET | Freq: Every day | ORAL | 1 refills | Status: DC
Start: 2021-07-18 — End: 2021-11-28

## 2021-07-18 MED ORDER — TRAZODONE HCL 50 MG PO TABS: 50.0000 mg | ORAL_TABLET | Freq: Every day | ORAL | 1 refills | Status: DC

## 2021-07-18 MED ORDER — METOPROLOL SUCCINATE ER 25 MG PO TB24: 25.0000 mg | ORAL_TABLET | Freq: Every day | ORAL | 1 refills | Status: DC

## 2021-07-18 MED ORDER — TAMSULOSIN HCL 0.4 MG PO CAPS: 0.4000 mg | ORAL_CAPSULE | Freq: Every day | ORAL | 1 refills | Status: DC

## 2021-07-18 NOTE — Assessment & Plan Note (Signed)
Under good control on current regimen. Continue current regimen. Continue to monitor. Call with any concerns. Refills given. Labs drawn today.   

## 2021-07-18 NOTE — Progress Notes (Signed)
? ?BP 116/74   Pulse 60   Temp (!) 97.4 ?F (36.3 ?C)   Wt 226 lb 6.4 oz (102.7 kg)   SpO2 98%   BMI 35.46 kg/m?   ? ?Subjective:  ? ? Patient ID: Thomas [name], male    DOB: 04-14-1951, 71 y.o.   MRN: [id number] ? ?HPI: ?Thomas [name] is a 71 y.o. male presenting on 07/18/2021 for comprehensive medical examination. Current medical complaints include: ? ?HYPERTENSION / HYPERLIPIDEMIA ?Satisfied with current treatment? yes ?Duration of hypertension: chronic ?BP monitoring frequency: a few times a day ?BP medication side effects: no ?Past BP meds: lisinopril, metoprolol ?Duration of hyperlipidemia: chronic ?Cholesterol medication side effects: no ?Cholesterol supplements: none ?Past cholesterol medications: simvastatin ?Medication compliance: excellent compliance ?Aspirin: no ?Recent stressors: no ?Recurrent headaches: no ?Visual changes: no ?Palpitations: no ?Dyspnea: no ?Chest pain: no ?Lower extremity edema: no ?Dizzy/lightheaded: no ? ?BPH ?BPH status: controlled ?Satisfied with current treatment?: yes ?Medication side effects: no ?Medication compliance: excellent compliance ?Duration: chronic ?Nocturia: 2-3x per night ?Urinary frequency:no ?Incomplete voiding: no ?Urgency: yes ?Weak urinary stream: no ?Straining to start stream: no ?Dysuria: no ?Onset: gradual ?Severity: mild ? ?He currently lives with: wife ?Interim Problems from his last visit: no ? ?Depression Screen done today and results listed below:  ? ?  07/18/2021  ?  2:14 PM 07/15/2021  ? 11:33 AM 07/13/2020  ?  1:03 PM 07/10/2020  ?  3:30 PM 06/28/2019  ? 12:35 PM  ?Depression screen PHQ 2/9  ?Decreased Interest 1 0 1 1 0  ?Down, Depressed, Hopeless 0 1 0 0 0  ?PHQ - 2 Score '1 1 1 1 '$ 0  ?Altered sleeping 2 0   0  ?Tired, decreased energy 1 1   0  ?Change in appetite 0 0   0  ?Feeling bad or failure about yourself  0 0   0  ?Trouble concentrating 2 2   0  ?Moving slowly or fidgety/restless 3 2   0  ?Suicidal thoughts 0 0   0  ?PHQ-9 Score 9 6   0  ?Difficult  doing work/chores  Somewhat difficult   Not difficult at all  ? ? ?Past Medical History:  ?Past Medical History:  ?Diagnosis Date  ? AF (atrial fibrillation) ([employer])   ? Allergy   ? Arthritis   ? Osteoarthritis  ? Arthritis of knee   ? Atrial fibrillation ([place])   ? DVT (deep venous thrombosis) ([place])   ? L leg  ? Dysplastic nevus 05/15/2021  ? Left Superior Pectoral, mod to severe, Excised 07/16/21  ? Dysrhythmia   ? History of kidney stones   ? Hyperchloremia   ? Hypercholesterolemia   ? Hypertension   ? Nephrolithiasis   ? Parkinson's disease ([place])   ? Plantar wart   ? Pneumonia   ? PONV (postoperative nausea and vomiting)   ? Pulmonary embolism ([place])   ? Sciatica   ? Seasonal allergies   ? Stroke [facility])   ? TIA (transient ischemic attack)   ? ? ?Surgical History:  ?Past Surgical History:  ?Procedure Laterality Date  ? APPENDECTOMY    ? CATARACT EXTRACTION Bilateral 10/2019 11/2019  ? COLONOSCOPY WITH PROPOFOL N/A 07/27/2019  ? Procedure: COLONOSCOPY WITH PROPOFOL;  Surgeon: Lin Landsman, MD;  [facility] ENDOSCOPY;  Service: Endoscopy;  Laterality: N/A;  ? EYE SURGERY    ? KNEE ARTHROSCOPY WITH MENISCAL REPAIR Right 2009  ? KNEE SURGERY Right   ? LUMBAR LAMINECTOMY/DECOMPRESSION MICRODISCECTOMY Left 10/11/2020  ?  Procedure: Left Lumbar Three-Four, Lumbar Fou-Five  Microdiscectomy;  Surgeon: Erline Levine, MD;  Location: [place];  Service: Neurosurgery;  Laterality: Left;  ? melanoma removal Left 07/16/2021  ? chest  ? T&A    ? TONSILLECTOMY    ? ? ?Medications:  ?Current Outpatient Medications on File Prior to Visit  ?Medication Sig  ? acetaminophen (TYLENOL) 500 MG tablet Take 1,000 mg by mouth daily.  ? amantadine (SYMMETREL) 100 MG capsule TAKE 1 CAPSULE BY MOUTH THREE TIMES DAILY  ? calcium carbonate (TUMS - DOSED IN MG ELEMENTAL CALCIUM) 500 MG chewable tablet Chew 1 tablet by mouth daily as needed for indigestion or heartburn.  ? carbidopa-levodopa (SINEMET CR) 50-200 MG tablet Take 1 tablet by mouth at  bedtime.  ? carbidopa-levodopa (SINEMET IR) 25-100 MG tablet TAKE 2 TABLETS FOUR TIMES DAILY - AT 6AM, 10AM, 2PM, AND 6PM  ? cetirizine (ZYRTEC) 10 MG tablet Take 10 mg by mouth at bedtime.  ? cholecalciferol (VITAMIN D3) 25 MCG (1000 UNIT) tablet Take 1,000 Units by mouth daily.  ? diclofenac Sodium (VOLTAREN) 1 % GEL APPLY TOPICALLY 4 GRAMS FOUR TIMES DAILY (Patient taking differently: Apply 4 g topically 4 (four) times daily as needed (pain).)  ? hydrALAZINE (APRESOLINE) 25 MG tablet Take 1 tablet (25 mg total) by mouth 3 (three) times daily as needed (for SBP >160). Takes in the evening  ? HYDROcodone-acetaminophen (NORCO/VICODIN) 5-325 MG tablet Take by mouth.  ? Insulin Pen Needle (PEN NEEDLES) 30G X 5 MM MISC 1 each by Does not apply route daily.  ? melatonin 5 MG TABS Take 5 mg by mouth at bedtime.  ? methocarbamol (ROBAXIN) 500 MG tablet Take 1 tablet (500 mg total) by mouth every 6 (six) hours as needed for muscle spasms.  ? Multiple Vitamin (MULTIVITAMIN) tablet Take 1 tablet by mouth daily at 6 PM.  ? mupirocin ointment (BACTROBAN) 2 % Apply 1 application. topically daily. With dressing changes  ? polyethylene glycol powder (GLYCOLAX/MIRALAX) 17 GM/SCOOP powder Take 17 g by mouth 2 (two) times daily as needed. (Patient taking differently: Take 17 g by mouth at bedtime.)  ? PREVIDENT 5000 BOOSTER PLUS 1.1 % PSTE Place 1 application onto teeth 2 (two) times daily.  ? rivaroxaban (XARELTO) 20 MG TABS tablet Take 1 tablet (20 mg total) by mouth daily.  ? rOPINIRole (REQUIP) 3 MG tablet TAKE 1 TABLET(3 MG) BY MOUTH THREE TIMES DAILY  ? traMADol (ULTRAM) 50 MG tablet Take 50 mg by mouth at bedtime.  ? ?No current facility-administered medications on file prior to visit.  ? ? ?Allergies:  ?Allergies  ?Allergen Reactions  ? Codeine Other (See Comments)  ?  Hallucinations  ? Other Nausea Only  ?  General anesthesia   ? ? ?Social History:  ?Social History  ? ?Socioeconomic History  ? Marital status: Married  ?   Spouse name: [name]  ? Number of children: 0  ? Years of education: Not on file  ? Highest education level: Some college, no degree  ?Occupational History  ? 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 Smoking status: Never  ? Smokeless tobacco: Never  ?Vaping Use  ? Vaping Use: Never used  ?Substance and Sexual Activity  ? Alcohol use: No  ?  Alcohol/week: 0.0 standard drinks  ? Drug use: No  ? Sexual activity: Not Currently  ?Other Topics Concern  ? Not on file  ?Social History Narrative  ? ** Merged History Encounter **  ?  right handed  ? Two story home   ? Live with spouse  ? ?Social Determinants of Health  ? ?Financial Resource Strain: Low Risk   ? Difficulty of Paying Living Expenses: Not hard at all  ?Food Insecurity: No Food Insecurity  ? Worried About Charity fundraiser in the Last Year: Never true  ? Ran Out of Food in the Last Year: Never true  ?Transportation Needs: No Transportation Needs  ? Lack of Transportation (Medical): No  ? Lack of Transportation (Non-Medical): No  ?Physical Activity: Insufficiently Active  ? Days of Exercise per Week: 3 days  ? Minutes of Exercise per Session: 40 min  ?Stress: No Stress Concern Present  ? Feeling of Stress : Not at all  ?Social Connections: Socially Integrated  ? Frequency of Communication with Friends and Family: Twice a week  ? Frequency of Social Gatherings with Friends and Family: Twice a week  ? Attends Religious Services: More than 4 times per year  ? Active Member of Clubs or Organizations: Yes  ? Attends Archivist Meetings: More than 4 times per year  ? Marital Status: Married  ?Intimate Partner Violence: Not At Risk  ? Fear of Current or Ex-Partner: No  ? Emotionally Abused: No  ? Physically Abused: No  ? Sexually Abused: No  ? ?Social History  ? ?Tobacco Use  ?Smoking Status Never  ?Smokeless Tobacco Never  ? ?Social History  ? ?Substance and Sexual Activity  ?Alcohol Use No  ? Alcohol/week: 0.0 standard drinks  ? ? ?Family  History:  ?Family History  ?Problem Relation Age of Onset  ? Parkinson's disease Father   ? Coronary artery disease Mother   ? Heart attack Sister   ? Deep vein thrombosis Sister   ? ? ?Past medical history, su

## 2021-07-18 NOTE — Assessment & Plan Note (Signed)
Stable. Continue to follow with neurology. Continue to monitor.  ?

## 2021-07-18 NOTE — Assessment & Plan Note (Signed)
Continue xarelto. Continue to monitor. Call with any concerns. ?

## 2021-07-18 NOTE — Telephone Encounter (Signed)
Requested Prescriptions  ?Pending Prescriptions Disp Refills  ?? simvastatin (ZOCOR) 40 MG tablet [Pharmacy Med Name: SIMVASTATIN '40MG'$  TABLETS] 90 tablet 0  ?  Sig: TAKE 1 TABLET(40 MG) BY MOUTH DAILY  ?  ? Cardiovascular:  Antilipid - Statins Failed - 07/18/2021  9:04 AM  ?  ?  Failed - Lipid Panel in normal range within the last 12 months  ?  Cholesterol, Total  ?Date Value Ref Range Status  ?07/10/2020 127 100 - 199 mg/dL Final  ? ?Cholesterol Piccolo, Rentz  ?Date Value Ref Range Status  ?03/01/2015 136 <200 mg/dL Final  ?  Comment:  ?                          Desirable                <200 ?                        Borderline High      200- 239 ?                        High                     >239 ?  ? ?LDL Chol Calc (NIH)  ?Date Value Ref Range Status  ?07/10/2020 60 0 - 99 mg/dL Final  ? ?HDL  ?Date Value Ref Range Status  ?07/10/2020 53 >39 mg/dL Final  ? ?Triglycerides  ?Date Value Ref Range Status  ?07/10/2020 67 0 - 149 mg/dL Final  ? ?Triglycerides Piccolo,Waived  ?Date Value Ref Range Status  ?03/01/2015 74 <150 mg/dL Final  ?  Comment:  ?                          Normal                   <150 ?                        Borderline High     150 - 199 ?                        High                200 - 499 ?                        Very High                >499 ?  ? ?  ?  ?  Passed - Patient is not pregnant  ?  ?  Passed - Valid encounter within last 12 months  ?  Recent Outpatient Visits   ?      ? Today Routine general medical examination at a health care facility  ? Winslow, Megan P, DO  ? 4 months ago Inability to walk  ? [place], Megan P, DO  ? 6 months ago Primary hypertension  ? Evans, Megan P, DO  ? 8 months ago Benign prostatic hyperplasia with urinary frequency  ? Whitewood, Megan P, DO  ? 9 months ago Activated protein C resistance ([place])  ? [place], [place],  DO  ?  ?  ?Future  Appointments   ?        ? In 1 week Ralene Bathe, MD [place]  ? In 6 months [name], Barb Merino, DO [place]  ? In 10 months Ralene Bathe, MD [place]  ?  ? ?  ?  ?  ? ?

## 2021-07-18 NOTE — Assessment & Plan Note (Signed)
NSR today. Continue to follow with cardiology. Call with any concerns.  

## 2021-07-19 ENCOUNTER — Encounter: Payer: Self-pay | Admitting: Family Medicine

## 2021-07-19 LAB — CBC WITH DIFFERENTIAL/PLATELET
Basophils Absolute: 0 10*3/uL (ref 0.0–0.2)
Basos: 1 %
EOS (ABSOLUTE): 0.1 10*3/uL (ref 0.0–0.4)
Eos: 2 %
Hematocrit: 42.8 % (ref 37.5–51.0)
Hemoglobin: 14.3 g/dL (ref 13.0–17.7)
Immature Grans (Abs): 0 10*3/uL (ref 0.0–0.1)
Immature Granulocytes: 0 %
Lymphocytes Absolute: 1.1 10*3/uL (ref 0.7–3.1)
Lymphs: 19 %
MCH: 30.7 pg (ref 26.6–33.0)
MCHC: 33.4 g/dL (ref 31.5–35.7)
MCV: 92 fL (ref 79–97)
Monocytes Absolute: 0.4 10*3/uL (ref 0.1–0.9)
Monocytes: 7 %
Neutrophils Absolute: 4 10*3/uL (ref 1.4–7.0)
Neutrophils: 71 %
Platelets: 152 10*3/uL (ref 150–450)
RBC: 4.66 x10E6/uL (ref 4.14–5.80)
RDW: 12.2 % (ref 11.6–15.4)
WBC: 5.6 10*3/uL (ref 3.4–10.8)

## 2021-07-19 LAB — COMPREHENSIVE METABOLIC PANEL
ALT: 9 IU/L (ref 0–44)
AST: 12 IU/L (ref 0–40)
Albumin/Globulin Ratio: 1.7 (ref 1.2–2.2)
Albumin: 4.3 g/dL (ref 3.7–4.7)
Alkaline Phosphatase: 90 IU/L (ref 44–121)
BUN/Creatinine Ratio: 17 (ref 10–24)
BUN: 17 mg/dL (ref 8–27)
Bilirubin Total: 0.5 mg/dL (ref 0.0–1.2)
CO2: 20 mmol/L (ref 20–29)
Calcium: 9.2 mg/dL (ref 8.6–10.2)
Chloride: 104 mmol/L (ref 96–106)
Creatinine, Ser: 0.99 mg/dL (ref 0.76–1.27)
Globulin, Total: 2.6 g/dL (ref 1.5–4.5)
Glucose: 103 mg/dL — ABNORMAL HIGH (ref 70–99)
Potassium: 4.4 mmol/L (ref 3.5–5.2)
Sodium: 141 mmol/L (ref 134–144)
Total Protein: 6.9 g/dL (ref 6.0–8.5)
eGFR: 81 mL/min/{1.73_m2} (ref >59)

## 2021-07-19 LAB — PSA: Prostate Specific Ag, Serum: 1.4 ng/mL (ref 0.0–4.0)

## 2021-07-19 LAB — URINALYSIS, ROUTINE W REFLEX MICROSCOPIC
Bilirubin, UA: NEGATIVE
Glucose, UA: NEGATIVE
Leukocytes,UA: NEGATIVE
Nitrite, UA: NEGATIVE
RBC, UA: NEGATIVE
Specific Gravity, UA: >1.03 — ABNORMAL HIGH (ref 1.005–1.030)
Urobilinogen, Ur: 1 mg/dL (ref 0.2–1.0)
pH, UA: 5.5 (ref 5.0–7.5)

## 2021-07-19 LAB — MICROALBUMIN, URINE WAIVED
Creatinine, Urine Waived: 200 mg/dL (ref 10–300)
Microalb, Ur Waived: 30 mg/L — ABNORMAL HIGH (ref 0–19)
Microalb/Creat Ratio: <30 mg/g (ref <30)

## 2021-07-19 LAB — LIPID PANEL W/O CHOL/HDL RATIO
Cholesterol, Total: 129 mg/dL (ref 100–199)
HDL: 53 mg/dL (ref >39)
LDL Chol Calc (NIH): 63 mg/dL (ref 0–99)
Triglycerides: 62 mg/dL (ref 0–149)
VLDL Cholesterol Cal: 13 mg/dL (ref 5–40)

## 2021-07-19 LAB — TSH: TSH: 1.13 u[IU]/mL (ref 0.450–4.500)

## 2021-07-20 LAB — ANATOMIC PATHOLOGY REPORT

## 2021-07-23 ENCOUNTER — Ambulatory Visit: Payer: Medicare HMO | Admitting: Dermatology

## 2021-07-23 DIAGNOSIS — H43813 Vitreous degeneration, bilateral: Secondary | ICD-10-CM | POA: Diagnosis not present

## 2021-07-23 DIAGNOSIS — H26493 Other secondary cataract, bilateral: Secondary | ICD-10-CM | POA: Diagnosis not present

## 2021-07-25 ENCOUNTER — Ambulatory Visit (INDEPENDENT_AMBULATORY_CARE_PROVIDER_SITE_OTHER): Payer: Medicare HMO | Admitting: Dermatology

## 2021-07-25 DIAGNOSIS — L905 Scar conditions and fibrosis of skin: Secondary | ICD-10-CM

## 2021-07-25 DIAGNOSIS — Z4802 Encounter for removal of sutures: Secondary | ICD-10-CM

## 2021-07-25 NOTE — Patient Instructions (Signed)

## 2021-07-25 NOTE — Progress Notes (Signed)
? ?  Follow-Up Visit ?  ?Subjective  ?Thomas [name] is a 71 y.o. male who presents for the following: Suture / Staple Removal. ?Wife with pt  ? ?The following portions of the chart were reviewed this encounter and updated as appropriate:  ? Tobacco  Allergies  Meds  Problems  Med Hx  Surg Hx  Fam Hx   ?  ?Review of Systems:  No other skin or systemic complaints except as noted in HPI or Assessment and Plan. ? ?Objective  ?Well appearing patient in no apparent distress; mood and affect are within normal limits. ? ?A focused examination was performed including chest. Relevant physical exam findings are noted in the Assessment and Plan. ? ?left superior pectoral ?Clear skin ? ? ?Assessment & Plan  ?Scar -site of severe dysplastic excisions-margins clear ?left superior pectoral ? ?Biopsy proven dysplastic ?Margins clear ? ?Encounter for Removal of Sutures ?- Incision site at the left superior pectoral is clean, dry and intact ?- Wound cleansed, sutures removed, wound cleansed and steri strips applied.  ?- Discussed pathology results showing dysplastic ?Margins clear  ?- Patient advised to keep steri-strips dry until they fall off. ?- Scars remodel for a full year. ?- Once steri-strips fall off, patient can apply over-the-counter silicone scar cream each night to help with scar remodeling if desired. ?- Patient advised to call with any concerns or if they notice any new or changing lesions.  ? ?Return for TBSE as scheduled Jan 2024. ? ?I, Marye Round, CMA, am acting as scribe for Sarina Ser, MD .  ?Documentation: I have reviewed the above documentation for accuracy and completeness, and I agree with the above. ? ?Sarina Ser, MD ? ?

## 2021-07-30 ENCOUNTER — Telehealth: Payer: Self-pay

## 2021-07-30 MED ORDER — LISINOPRIL 30 MG PO TABS: 30.0000 mg | ORAL_TABLET | Freq: Every day | ORAL | 1 refills | Status: DC

## 2021-07-30 NOTE — Telephone Encounter (Signed)
Received a fax from [facility] stating "the patient states that the prescription should have been filled differently than prescribed.  ?Lisinopril '20mg'$  tablets was sent in, patient states it should be lisinopril '30mg'$  tablets.  ?Reviewed OV note from 01/10/2021, states lisinopril will cut to '20mg'$ .  ?Please advise correct dosage.  ?Attempted to contact patient to clarify what dosage patient has been taking.  ?

## 2021-07-30 NOTE — Telephone Encounter (Signed)
Pt called back and stated he takes '30mg'$ , please advise.  ?

## 2021-08-02 ENCOUNTER — Encounter: Payer: Self-pay | Admitting: Dermatology

## 2021-08-15 ENCOUNTER — Ambulatory Visit (INDEPENDENT_AMBULATORY_CARE_PROVIDER_SITE_OTHER): Payer: Medicare HMO | Admitting: Family Medicine

## 2021-08-15 ENCOUNTER — Encounter: Payer: Self-pay | Admitting: Family Medicine

## 2021-08-15 VITALS — BP 103/69 | HR 67 | Temp 98.4°F | Wt 226.0 lb

## 2021-08-15 DIAGNOSIS — J069 Acute upper respiratory infection, unspecified: Secondary | ICD-10-CM

## 2021-08-15 MED ORDER — BENZONATATE 200 MG PO CAPS: 200.0000 mg | ORAL_CAPSULE | Freq: Two times a day (BID) | ORAL | 0 refills | Status: DC | PRN

## 2021-08-15 MED ORDER — HYDROCOD POLI-CHLORPHE POLI ER 10-8 MG/5ML PO SUER: 5.0000 mL | Freq: Two times a day (BID) | ORAL | 0 refills | Status: DC | PRN

## 2021-08-15 MED ORDER — PREDNISONE 20 MG PO TABS
20.0000 mg | ORAL_TABLET | Freq: Every day | ORAL | 0 refills | Status: DC
Start: 2021-08-15 — End: 2021-09-16

## 2021-08-15 NOTE — Progress Notes (Signed)
? ?BP 103/69   Pulse 67   Temp 98.4 ?F (36.9 ?C)   Wt 226 lb (102.5 kg)   SpO2 97%   BMI 35.40 kg/m?   ? ?Subjective:  ? ? Patient ID: Thomas [name], male    DOB: November 04, 1950, 71 y.o.   MRN: [id number] ? ?HPI: ?Thomas [name] is a 71 y.o. male ? ?Chief Complaint  ?Patient presents with  ? URI  ?  Patient states he is coughing and has congestion. Symptoms began 5 days ago. Patient states he feels like he has to clear his throat a lot.   ? Eye Drainage  ?  Patient states he woke up this morning with his right eye shut with drainage, and red.   ? ?UPPER RESPIRATORY TRACT INFECTION ?Duration: 5 days ?Worst symptom: cough, congestion ?Fever: no ?Cough: yes ?Shortness of breath: no ?Wheezing: no ?Chest pain: no ?Chest tightness: no ?Chest congestion: yes ?Nasal congestion: yes ?Runny nose: yes ?Post nasal drip: yes ?Sneezing: yes ?Sore throat: yes ?Swollen glands: no ?Sinus pressure: no ?Headache: yes ?Face pain: no ?Toothache: no ?Ear pain: no  ?Ear pressure: yes bilateral ?Eyes red/itching:yes ?Eye drainage/crusting: no  ?Vomiting: no ?Rash: no ?Fatigue: yes ?Sick contacts: yes ?Strep contacts: no  ?Context: worse ?Recurrent sinusitis: no ?Relief with OTC cold/cough medications: no  ?Treatments attempted: corecidin  ? ?Relevant past medical, surgical, family and social history reviewed and updated as indicated. Interim medical history since our last visit reviewed. ?Allergies and medications reviewed and updated. ? ?Review of Systems  ?Constitutional: Negative.   ?HENT: Negative.    ?Respiratory: Negative.    ?Cardiovascular: Negative.   ?Gastrointestinal: Negative.   ?Musculoskeletal: Negative.   ?Neurological: Negative.   ?Psychiatric/Behavioral: Negative.    ? ?Per HPI unless specifically indicated above ? ?   ?Objective:  ?  ?BP 103/69   Pulse 67   Temp 98.4 ?F (36.9 ?C)   Wt 226 lb (102.5 kg)   SpO2 97%   BMI 35.40 kg/m?   ?Wt Readings from Last 3 Encounters:  ?08/15/21 226 lb (102.5 kg)  ?07/18/21 226 lb 6.4  oz (102.7 kg)  ?04/02/21 231 lb 12.8 oz (105.1 kg)  ?  ?Physical Exam ?Vitals and nursing note reviewed.  ?Constitutional:   ?   General: He is not in acute distress. ?   Appearance: Normal appearance. He is obese. He is not ill-appearing, toxic-appearing or diaphoretic.  ?HENT:  ?   Head: Normocephalic and atraumatic.  ?   Right Ear: Tympanic membrane, ear canal and external ear normal.  ?   Left Ear: Tympanic membrane, ear canal and external ear normal.  ?   Nose: Rhinorrhea present.  ?   Mouth/Throat:  ?   Mouth: Mucous membranes are moist.  ?   Pharynx: Oropharynx is clear. No oropharyngeal exudate or posterior oropharyngeal erythema.  ?Eyes:  ?   General: No scleral icterus.    ?   Right eye: No discharge.     ?   Left eye: No discharge.  ?   Extraocular Movements: Extraocular movements intact.  ?   Conjunctiva/sclera: Conjunctivae normal.  ?   Pupils: Pupils are equal, round, and reactive to light.  ?Cardiovascular:  ?   Rate and Rhythm: Normal rate and regular rhythm.  ?   Pulses: Normal pulses.  ?   Heart sounds: Normal heart sounds. No murmur heard. ?  No friction rub. No gallop.  ?Pulmonary:  ?   Effort: Pulmonary effort is normal. No respiratory distress.  ?  Breath sounds: Normal breath sounds. No stridor. No wheezing, rhonchi or rales.  ?Chest:  ?   Chest wall: No tenderness.  ?Musculoskeletal:     ?   General: Normal range of motion.  ?   Cervical back: Normal range of motion and neck supple.  ?Skin: ?   General: Skin is warm and dry.  ?   Capillary Refill: Capillary refill takes less than 2 seconds.  ?   Coloration: Skin is not jaundiced or pale.  ?   Findings: No bruising, erythema, lesion or rash.  ?Neurological:  ?   General: No focal deficit present.  ?   Mental Status: He is alert and oriented to person, place, and time. Mental status is at baseline.  ?Psychiatric:     ?   Mood and Affect: Mood normal.     ?   Behavior: Behavior normal.     ?   Thought Content: Thought content normal.     ?    Judgment: Judgment normal.  ? ? ?Results for orders placed or performed in visit on 07/18/21  ?Comprehensive metabolic panel  ?Result Value Ref Range  ? Glucose 103 (H) 70 - 99 mg/dL  ? BUN 17 8 - 27 mg/dL  ? Creatinine, Ser 0.99 0.76 - 1.27 mg/dL  ? eGFR 81 >59 mL/min/1.73  ? BUN/Creatinine Ratio 17 10 - 24  ? Sodium 141 134 - 144 mmol/L  ? Potassium 4.4 3.5 - 5.2 mmol/L  ? Chloride 104 96 - 106 mmol/L  ? CO2 20 20 - 29 mmol/L  ? Calcium 9.2 8.6 - 10.2 mg/dL  ? Total Protein 6.9 6.0 - 8.5 g/dL  ? Albumin 4.3 3.7 - 4.7 g/dL  ? Globulin, Total 2.6 1.5 - 4.5 g/dL  ? Albumin/Globulin Ratio 1.7 1.2 - 2.2  ? Bilirubin Total 0.5 0.0 - 1.2 mg/dL  ? Alkaline Phosphatase 90 44 - 121 IU/L  ? AST 12 0 - 40 IU/L  ? ALT 9 0 - 44 IU/L  ?CBC with Differential/Platelet  ?Result Value Ref Range  ? WBC 5.6 3.4 - 10.8 x10E3/uL  ? RBC 4.66 4.14 - 5.80 x10E6/uL  ? Hemoglobin 14.3 13.0 - 17.7 g/dL  ? Hematocrit 42.8 37.5 - 51.0 %  ? MCV 92 79 - 97 fL  ? MCH 30.7 26.6 - 33.0 pg  ? MCHC 33.4 31.5 - 35.7 g/dL  ? RDW 12.2 11.6 - 15.4 %  ? Platelets 152 150 - 450 x10E3/uL  ? Neutrophils 71 Not Estab. %  ? Lymphs 19 Not Estab. %  ? Monocytes 7 Not Estab. %  ? Eos 2 Not Estab. %  ? Basos 1 Not Estab. %  ? Neutrophils Absolute 4.0 1.4 - 7.0 x10E3/uL  ? Lymphocytes Absolute 1.1 0.7 - 3.1 x10E3/uL  ? Monocytes Absolute 0.4 0.1 - 0.9 x10E3/uL  ? EOS (ABSOLUTE) 0.1 0.0 - 0.4 x10E3/uL  ? Basophils Absolute 0.0 0.0 - 0.2 x10E3/uL  ? Immature Granulocytes 0 Not Estab. %  ? Immature Grans (Abs) 0.0 0.0 - 0.1 x10E3/uL  ?Lipid Panel w/o Chol/HDL Ratio  ?Result Value Ref Range  ? Cholesterol, Total 129 100 - 199 mg/dL  ? Triglycerides 62 0 - 149 mg/dL  ? HDL 53 >39 mg/dL  ? VLDL Cholesterol Cal 13 5 - 40 mg/dL  ? LDL Chol Calc (NIH) 63 0 - 99 mg/dL  ?PSA  ?Result Value Ref Range  ? Prostate Specific Ag, Serum 1.4 0.0 - 4.0 ng/mL  ?TSH  ?Result  Value Ref Range  ? TSH 1.130 0.450 - 4.500 uIU/mL  ?Urinalysis, Routine w reflex microscopic  ?Result Value Ref  Range  ? Specific Gravity, UA >1.030 (H) 1.005 - 1.030  ? pH, UA 5.5 5.0 - 7.5  ? Color, UA Yellow Yellow  ? Appearance Ur Clear Clear  ? Leukocytes,UA Negative Negative  ? Protein,UA Trace (A) Negative/Trace  ? Glucose, UA Negative Negative  ? Ketones, UA Trace (A) Negative  ? RBC, UA Negative Negative  ? Bilirubin, UA Negative Negative  ? Urobilinogen, Ur 1.0 0.2 - 1.0 mg/dL  ? Nitrite, UA Negative Negative  ?Microalbumin, Urine Waived  ?Result Value Ref Range  ? Microalb, Ur Waived 30 (H) 0 - 19 mg/L  ? Creatinine, Urine Waived 200 10 - 300 mg/dL  ? Microalb/Creat Ratio <30 <30 mg/g  ? ?   ?Assessment & Plan:  ? ?Problem List Items Addressed This Visit   ?None ?Visit Diagnoses   ? ? Upper respiratory tract infection, unspecified type    -  Primary  ? Will treat with prednisone, tessalon and tussionex. Call if not getting better or getting worse. Continue to monitor.   ? ?  ?  ? ?Follow up plan: ?Return if symptoms worsen or fail to improve. ? ? ? ? ? ?

## 2021-08-19 ENCOUNTER — Ambulatory Visit: Payer: Self-pay | Admitting: *Deleted

## 2021-08-19 NOTE — Telephone Encounter (Signed)
Patient wife states PCP advised to call back on Monday if not getting better for an antibiotic be sent it.  ?

## 2021-08-19 NOTE — Telephone Encounter (Signed)
?  Chief Complaint: Wife asking for something to be called in for the cough.  Saw Dr. [name] 08/15/2021.  Has Parkinson's. ?Symptoms: Due to Parkinson's hard for him to cough up anything.  Wife wondering if an antibiotic or another cough medication would help.   They are leaving for [place] on 08/23/2021 so wanted him to be better by then.contin ?Frequency: Now ?Pertinent Negatives: Patient denies N/A ?Disposition: '[]'$ ED /'[]'$ Urgent Care (no appt availability in office) / '[]'$ Appointment(In office/virtual)/ '[]'$  [facility]/ '[]'$ Home Care/ '[]'$ Refused Recommended Disposition /'[]'$ [facility]/ '[x]'$  Follow-up with PCP ?Additional Notes: I am sending request for antibiotic/cough  medication to Dr. Park Liter.   Wife, [name] agreeable to someone calling her back.    ?

## 2021-08-19 NOTE — Telephone Encounter (Signed)
I returned the call to Thomas [name], wife (on [place]).  Pt has a bad cold and saw Dr. [name] 08/15/2021.  He has Parkinson's. ?His right eye was puffy and red and watering when he was in to see Dr. [name] and now his left eye is the same way. ?He has gotten better but he still has the cough.  Due to the Parkinson's it's hard for him to cough up anything.   ?Reason for Disposition ? [1] Continuous (nonstop) coughing interferes with work or school AND [2] no improvement using cough treatment per Care Advice ? ?Answer Assessment - Initial Assessment Questions ?1. ONSET: "When did the cough begin?"  ?    Pt seen by Dr. [name] on 08/15/2021.   He had a bad cold.  He has gotten better but the cough is still there per [name], his wife. ?2. SEVERITY: "How bad is the cough today?"  ?    It's worse when he lays down.   Due to the Parkinson's he has a hard time coughing up anything. ?3. SPUTUM: "Describe the color of your sputum" (none, dry cough; clear, white, yellow, green) ?    Not getting anything up but has congestion.   "It's not in his chest per Dr. [name]" ?4. HEMOPTYSIS: "Are you coughing up any blood?" If so ask: "How much?" (flecks, streaks, tablespoons, etc.) ?    Not asked ?5. DIFFICULTY BREATHING: "Are you having difficulty breathing?" If Yes, ask: "How bad is it?" (e.g., mild, moderate, severe)  ?  - MILD: No SOB at rest, mild SOB with walking, speaks normally in sentences, can lie down, no retractions, pulse < 100.  ?  - MODERATE: SOB at rest, SOB with minimal exertion and prefers to sit, cannot lie down flat, speaks in phrases, mild retractions, audible wheezing, pulse 100-120.  ?  - SEVERE: Very SOB at rest, speaks in single words, struggling to breathe, sitting hunched forward, retractions, pulse > 120  ?    *No Answer* ?6. FEVER: "Do you have a fever?" If Yes, ask: "What is your temperature, how was it measured, and when did it start?" ?    *No Answer* ?7. CARDIAC HISTORY: "Do you have any history of heart  disease?" (e.g., heart attack, congestive heart failure)  ?    *No Answer* ?8. LUNG HISTORY: "Do you have any history of lung disease?"  (e.g., pulmonary embolus, asthma, emphysema) ?    *No Answer* ?9. PE RISK FACTORS: "Do you have a history of blood clots?" (or: recent major surgery, recent prolonged travel, bedridden) ?    Has Parkinson's so hard for him to sit up and really cough.  The [employer] have not helped.   He has taken some Coriciden which helped.  Last night I gave him Hydrocodone cough syrup and it really helped. ?10. OTHER SYMPTOMS: "Do you have any other symptoms?" (e.g., runny nose, wheezing, chest pain) ?      *No Answer* ?11. PREGNANCY: "Is there any chance you are pregnant?" "When was your last menstrual period?" ?      *No Answer* ?12. TRAVEL: "Have you traveled out of the country in the last month?" (e.g., travel history, exposures) ?      No but leaving on 08/23/2021 to go to [place] so wanted his cough to be better by then. ? ?Protocols used: Cough - Acute Productive-A-AH ? ?

## 2021-08-20 MED ORDER — DOXYCYCLINE HYCLATE 100 MG PO TABS: 100.0000 mg | ORAL_TABLET | Freq: Two times a day (BID) | ORAL | 0 refills | Status: DC

## 2021-08-20 NOTE — Addendum Note (Signed)
Addended by: [name] on: 08/20/2021 08:43 AM ? ? Modules accepted: Orders ? ?

## 2021-08-20 NOTE — Telephone Encounter (Signed)
Rx sent 

## 2021-08-20 NOTE — Telephone Encounter (Signed)
Patient is aware of medication being sent, no further questions.  ?

## 2021-09-13 ENCOUNTER — Other Ambulatory Visit: Payer: Self-pay | Admitting: Neurology

## 2021-09-13 NOTE — Progress Notes (Signed)
Assessment/Plan:    1.  Parkinsons Disease             -Continue ropinirole, 3 mg 3 times per day.  Discussed with him that we will watch closely memory, especially as we do neurocognitive testing.  We may have to decrease that in the future.  We had increased it, and they thought it helped his motor symptoms.             -Continue carbidopa/levodopa 25/100, 2 tablets at 6 AM/10 AM/2 PM/6 PM             -Continue carbidopa/levodopa 50/200 CR at bedtime  -He is following regularly with dermatology.  He was last seen at dermatology in April, 2023.  -I gave the patient samples of Inbrija to use on an as-needed basis.  He was shown how to use it.  He will let me know how he does with it.     2.  Parkinson's dyskinesia             -Continue amantadine, 100 mg 3 times per day.  Wife asked me about cost of amantadine, stating that it cost a few $100.  I looked it up on good Rx, and it was only $30-40 at [employer] and only $12 on [place].  I recommended using those resources.  I have never had amantadine cost in the 100s of dollars.  -Did discuss that amantadine can affect memory and we will need to watch that closely.  At this point in time, I am not convinced he has PDD.   3.  RBD             -Discussed safety.  Declines medication right now.   4.  MCI             -Neurocognitive test in August, 2020 without evidence of dementia.    -we will schedule repeat neurocog testing.  I have not convinced about PDD, but I know that his wife really worries about this and worries understandably about the future.  We talked about future resources and future needs.    5.  Neurogenic Orthostatic Hypotension  -Doing well right now.             -likely due to BP meds in combination with Parkinsons Disease.    His PCP told him to only take amlodipine if his blood pressure is running greater than 160.  He is on hydralazine, lisinopril, metoprolol and tamsulosin which all are driving down blood  pressure as well.    They do tell me they are watching it closely at home and he has been asymptomatic.   6.    Lumbar spinal stenosis and neural foraminal stenosis             -status post surgical intervention with microdiscectomy at L3-L4 and L4-L5 on October 11, 2020 with Dr. Vertell Limber.  Will need to follow with Dr. Reatha Armour if needs someone in the future.  7.  Sleep disturbance  -On trazodone, prescribed by primary care  -On melatonin, 5 mg nightly  Subjective:   Thomas [name] was seen today in follow up for Parkinsons disease.  My previous records were reviewed prior to todays visit as well as outside records available to me.  Patient was not walking well last visit.  We increased his ropinirole and sent him to physical therapy.  We also did lab work at that visit, but it was essentially unremarkable, including his UA.  He is doing better than last visit and they think that the increase in ropinirole helped.  No hallucinations or compulsive behaviors.  He has had a good few weeks and before that he had both good and bad days.  No lightheaded/near syncope.  He has most trouble in the AM and late evening.  That also comes and goes but he will use the walker if needed.  Wife asks about memory fogginess.  Wife stressed about worrying about future caregiving and how to best provide that.  She asks several questions in that regard.  Current prescribed movement disorder medications:   Ropinirole, 3 mg 3 times per day (increased last visit from 2 mg 3 times per day) Carbidopa/levodopa 25/100, 2 tablets at 6 AM/2 tablets at 10 AM/2 tablets at 2 PM/2 tablets at 6 PM Amantadine 100 mg 3 times per day carbidopa/levodopa 50/200 CR q hs    ALLERGIES:   Allergies  Allergen Reactions   Codeine Other (See Comments)    Hallucinations   Other Nausea Only    General anesthesia     CURRENT MEDICATIONS:  Outpatient Encounter Medications as of 09/16/2021  Medication Sig   acetaminophen (TYLENOL) 500 MG tablet  Take 1,000 mg by mouth daily.   amantadine (SYMMETREL) 100 MG capsule TAKE 1 CAPSULE THREE TIMES DAILY   amLODipine (NORVASC) 2.5 MG tablet Take 1 tablet (2.5 mg total) by mouth daily as needed (BP >160/90). Take in the morning   benzonatate (TESSALON) 200 MG capsule Take 1 capsule (200 mg total) by mouth 2 (two) times daily as needed for cough.   calcium carbonate (TUMS - DOSED IN MG ELEMENTAL CALCIUM) 500 MG chewable tablet Chew 1 tablet by mouth daily as needed for indigestion or heartburn.   carbidopa-levodopa (SINEMET CR) 50-200 MG tablet Take 1 tablet by mouth at bedtime.   carbidopa-levodopa (SINEMET IR) 25-100 MG tablet TAKE 2 TABLETS FOUR TIMES DAILY - AT 6AM, 10AM, 2PM, AND 6PM   cetirizine (ZYRTEC) 10 MG tablet Take 10 mg by mouth at bedtime.   cholecalciferol (VITAMIN D3) 25 MCG (1000 UNIT) tablet Take 1,000 Units by mouth daily.   diclofenac Sodium (VOLTAREN) 1 % GEL APPLY TOPICALLY 4 GRAMS FOUR TIMES DAILY (Patient taking differently: Apply 4 g topically 4 (four) times daily as needed (pain).)   hydrALAZINE (APRESOLINE) 25 MG tablet Take 1 tablet (25 mg total) by mouth 3 (three) times daily as needed (for SBP >160). Takes in the evening   HYDROcodone-acetaminophen (NORCO/VICODIN) 5-325 MG tablet Take by mouth.   Insulin Pen Needle (PEN NEEDLES) 30G X 5 MM MISC 1 each by Does not apply route daily.   lisinopril (ZESTRIL) 30 MG tablet Take 1 tablet (30 mg total) by mouth daily.   melatonin 5 MG TABS Take 5 mg by mouth at bedtime.   methocarbamol (ROBAXIN) 500 MG tablet Take 1 tablet (500 mg total) by mouth every 6 (six) hours as needed for muscle spasms.   metoprolol succinate (TOPROL-XL) 25 MG 24 hr tablet Take 1 tablet (25 mg total) by mouth at bedtime.   Multiple Vitamin (MULTIVITAMIN) tablet Take 1 tablet by mouth daily at 6 PM.   mupirocin ointment (BACTROBAN) 2 % Apply 1 application. topically daily. With dressing changes   polyethylene glycol powder (GLYCOLAX/MIRALAX) 17  GM/SCOOP powder Take 17 g by mouth 2 (two) times daily as needed. (Patient taking differently: Take 17 g by mouth at bedtime.)   PREVIDENT 5000 BOOSTER PLUS 1.1 % PSTE Place 1 application onto  teeth 2 (two) times daily.   rivaroxaban (XARELTO) 20 MG TABS tablet Take 1 tablet (20 mg total) by mouth daily.   rOPINIRole (REQUIP) 3 MG tablet TAKE 1 TABLET(3 MG) BY MOUTH THREE TIMES DAILY   simvastatin (ZOCOR) 40 MG tablet Take 1 tablet (40 mg total) by mouth daily.   tamsulosin (FLOMAX) 0.4 MG CAPS capsule Take 1 capsule (0.4 mg total) by mouth daily.   traMADol (ULTRAM) 50 MG tablet Take 50 mg by mouth at bedtime.   traZODone (DESYREL) 50 MG tablet Take 1 tablet (50 mg total) by mouth at bedtime.   chlorpheniramine-HYDROcodone (TUSSIONEX PENNKINETIC ER) 10-8 MG/5ML Take 5 mLs by mouth every 12 (twelve) hours as needed. (Patient not taking: Reported on 09/16/2021)   doxycycline (VIBRA-TABS) 100 MG tablet Take 1 tablet (100 mg total) by mouth 2 (two) times daily.   [DISCONTINUED] amantadine (SYMMETREL) 100 MG capsule TAKE 1 CAPSULE BY MOUTH THREE TIMES DAILY   [DISCONTINUED] predniSONE (DELTASONE) 20 MG tablet Take 1 tablet (20 mg total) by mouth daily with breakfast. (Patient not taking: Reported on 09/16/2021)   No facility-administered encounter medications on file as of 09/16/2021.    Objective:   PHYSICAL EXAMINATION:    VITALS:   Vitals:   09/16/21 1401  BP: 112/72  Pulse: 62  SpO2: 98%  Weight: 226 lb (102.5 kg)  Height: '5\' 7"'$  (1.702 m)      GEN:  The patient appears stated age and is in NAD. HEENT:  Normocephalic, atraumatic.  The mucous membranes are moist. The superficial temporal arteries are without ropiness or tenderness. CV:  brady Lungs:  CTAB Neck/HEME:  There are no carotid bruits bilaterally.   Neurological examination:  Orientation: The patient is alert and oriented x3.  He is able to provide his own history and ask appropriate questions. Cranial nerves: There is  good facial symmetry with facial hypomimia. The speech is fluent and clear. Soft palate rises symmetrically and there is no tongue deviation. Hearing is intact to conversational tone. Sensation: Sensation is intact to light touch throughout Motor: Strength is at least antigravity x4.  Movement examination: Tone: There is mild increased tone in the RUE Abnormal movements: he has some mild dyskinesia, mostly on the L and some axial Coordination:  There is good rapid alternating movements today. Gait and Station: The patient difficulty arising out of a deep-seated chair without the use of the hands. He pushes off to arise.  The patient's stride length is decreased with decreased arm swing bilaterally, R>L.  He is flexed at the waist.    I have reviewed and interpreted the following labs independently    Chemistry      Component Value Date/Time   NA 141 07/18/2021 1442   K 4.4 07/18/2021 1442   CL 104 07/18/2021 1442   CO2 20 07/18/2021 1442   BUN 17 07/18/2021 1442   CREATININE 0.99 07/18/2021 1442      Component Value Date/Time   CALCIUM 9.2 07/18/2021 1442   ALKPHOS 90 07/18/2021 1442   AST 12 07/18/2021 1442   ALT 9 07/18/2021 1442   BILITOT 0.5 07/18/2021 1442       Lab Results  Component Value Date   WBC 5.6 07/18/2021   HGB 14.3 07/18/2021   HCT 42.8 07/18/2021   MCV 92 07/18/2021   PLT 152 07/18/2021    Lab Results  Component Value Date   TSH 1.130 07/18/2021     Total time spent on today's visit was 43  minutes, including both face-to-face time and nonface-to-face time.  Time included that spent on review of records (prior notes available to me/labs/imaging if pertinent), discussing treatment and goals, answering patient's questions and coordinating care.  Cc:  [name], DO

## 2021-09-16 ENCOUNTER — Encounter: Payer: Self-pay | Admitting: Neurology

## 2021-09-16 ENCOUNTER — Ambulatory Visit: Payer: Medicare HMO | Admitting: Neurology

## 2021-09-16 VITALS — BP 112/72 | HR 62 | Ht 67.0 in | Wt 226.0 lb

## 2021-09-16 DIAGNOSIS — R413 Other amnesia: Secondary | ICD-10-CM

## 2021-09-16 DIAGNOSIS — G20 Parkinson's disease: Secondary | ICD-10-CM

## 2021-09-16 DIAGNOSIS — G249 Dystonia, unspecified: Secondary | ICD-10-CM

## 2021-09-16 MED ORDER — INBRIJA 42 MG IN CAPS
ORAL_CAPSULE | RESPIRATORY_TRACT | 0 refills | Status: DC
Start: 2021-09-16 — End: 2024-02-29

## 2021-09-16 NOTE — Patient Instructions (Addendum)
We are going to start inbrija.  Remember that TWO capsules is ONE dosage (never inhale just one capsule).  You can inhale the capsules as needed up to 5 times per day, separated by 2 hour intervals.  Many patients use this right when the wake up to help with first morning "on" and then as needed during the day.  You should take a sip of water prior to using the inhaler to avoid side effects.  It may generate some cough right when you use it and that is normal.  There are nurse educators available to help you with this device.  You can call [phone] and they will set you up with a nurse educator to assist you for free of charge.  They are available 8am-8pm Monday-Friday.   You have been referred for a neurocognitive evaluation (i.e., evaluation of memory and thinking abilities). Please bring someone with you to this appointment if possible, as it is helpful for the neuropsychologist to hear from both you and another adult who knows you well. Please bring eyeglasses and hearing aids if you wear them and take any medications as you normally would.    The evaluation will take approximately 2-3 hours and has two parts:   The first part is a clinical interview with the neuropsychologist, Dr. Melvyn Novas.  During the interview, the neuropsychologist will speak with you and the individual you brought to the appointment.    The second part of the evaluation is testing with the doctor's technician, aka psychometrician, Hinton Dyer or [employer]. During the testing, the technician will ask you to remember different types of material, solve problems, and answer some questionnaires. Your family member will not be present for this portion of the evaluation.   Please note: We have to reserve several hours of the neuropsychologist's time and the psychometrician's time for your evaluation appointment. As such, there is a No-Show fee of $100. If you are unable to attend any of your appointments, please contact our office as soon as  possible to reschedule.

## 2021-10-23 ENCOUNTER — Other Ambulatory Visit: Payer: Self-pay | Admitting: Family Medicine

## 2021-10-23 NOTE — Telephone Encounter (Signed)
Rx 07/30/21 #90 1RF- should have RF on file Requested Prescriptions  Pending Prescriptions Disp Refills  . lisinopril (ZESTRIL) 30 MG tablet 90 tablet 1    Sig: Take 1 tablet (30 mg total) by mouth daily.     Cardiovascular:  ACE Inhibitors Passed - 10/23/2021  2:05 PM      Passed - Cr in normal range and within 180 days    Creatinine, Ser  Date Value Ref Range Status  07/18/2021 0.99 0.76 - 1.27 mg/dL Final         Passed - K in normal range and within 180 days    Potassium  Date Value Ref Range Status  07/18/2021 4.4 3.5 - 5.2 mmol/L Final         Passed - Patient is not pregnant      Passed - Last BP in normal range    BP Readings from Last 1 Encounters:  09/16/21 112/72         Passed - Valid encounter within last 6 months    Recent Outpatient Visits          2 months ago Upper respiratory tract infection, unspecified type   [facility] [place], Megan P, DO   3 months ago Routine general medical examination at a health care facility   [facility], [place] P, DO   8 months ago Inability to walk   [facility], [place], DO   9 months ago Primary hypertension   [place], [place], DO   11 months ago Benign prostatic hyperplasia with urinary frequency   [facility] [name], DO      Future Appointments            In 3 months [name], Barb Merino, DO [facility], [place]   In 6 months Ralene Bathe, MD [place]

## 2021-10-23 NOTE — Telephone Encounter (Signed)
Medication Refill - Medication: lisinopril (ZESTRIL) 30 MG tablet  Has the patient contacted their pharmacy? Yes.   (Agent: If no, request that the patient contact the pharmacy for the refill. If patient does not wish to contact the pharmacy document the reason why and proceed with request.) (Agent: If yes, when and what did the pharmacy advise?)  Preferred Pharmacy (with phone number or street name):  [name] [id number], [place]  [place] [address]  Phone: [phone] Fax: [phone]   Has the patient been seen for an appointment in the last year OR does the patient have an upcoming appointment? Yes.    Agent: Please be advised that RX refills may take up to 3 business days. We ask that you follow-up with your pharmacy.

## 2021-11-07 ENCOUNTER — Other Ambulatory Visit: Payer: Self-pay | Admitting: Family Medicine

## 2021-11-07 MED ORDER — LISINOPRIL 30 MG PO TABS: 30.0000 mg | ORAL_TABLET | Freq: Every day | ORAL | 1 refills | Status: DC

## 2021-11-07 NOTE — Telephone Encounter (Signed)
6 month supply sent in in April- should have a Rx at the pharmacy

## 2021-11-07 NOTE — Telephone Encounter (Signed)
Pharmacy:  [name] [id number] - [address], [place] Phone:  [phone]  Fax:  [phone]

## 2021-11-07 NOTE — Telephone Encounter (Signed)
Per patient wife they do not use [facility], they use [name] for patients medications.

## 2021-11-07 NOTE — Telephone Encounter (Signed)
Pt's spouse called in to follow up on refill request for Lisinopril. Spouse says that pt is completely out of his medication.   Please assist further.    Pharmacy:  [name] [id number] - [address], [place] Phone:  [phone]  Fax:  [phone]

## 2021-11-22 ENCOUNTER — Telehealth: Payer: Self-pay | Admitting: Family Medicine

## 2021-11-22 NOTE — Telephone Encounter (Signed)
Copied from [place] [id number]. Topic: Referral - Request for Referral >> Nov 22, 2021 10:09 AM Everette C wrote: Has patient seen PCP for this complaint? No. *If NO, is insurance requiring patient see PCP for this issue before PCP can refer them? Referral for which specialty: oral surgery  Preferred provider/office: Dr. Hampton Abbot [facility] (phone [phone] fax [phone]) Reason for referral: tooth extraction

## 2021-11-22 NOTE — Telephone Encounter (Signed)
Patient does not need referral placed, just needs OK to confirm how many days prior does patient need to stop Xarelto before teeth extraction. Will place form in folder.

## 2021-11-25 NOTE — Telephone Encounter (Signed)
Pt's spouse checking status of forms [facility]  Spouse requesting a cb for status update  Please assist further

## 2021-11-25 NOTE — Telephone Encounter (Signed)
Form filled out. OK told xarelto 3 days before and after procedure.

## 2021-11-26 NOTE — Telephone Encounter (Signed)
Faxed to dental office and notified patient wife.

## 2021-11-28 ENCOUNTER — Other Ambulatory Visit: Payer: Self-pay | Admitting: Family Medicine

## 2021-11-28 NOTE — Telephone Encounter (Signed)
Requested Prescriptions  Pending Prescriptions Disp Refills  . XARELTO 20 MG TABS tablet [Pharmacy Med Name: XARELTO 20 MG Tablet] 90 tablet     Sig: TAKE 1 TABLET EVERY DAY     Hematology: Anticoagulants - rivaroxaban Passed - 11/28/2021  2:40 AM      Passed - ALT in normal range and within 360 days    ALT  Date Value Ref Range Status  07/18/2021 9 0 - 44 IU/L Final         Passed - AST in normal range and within 360 days    AST  Date Value Ref Range Status  07/18/2021 12 0 - 40 IU/L Final         Passed - Cr in normal range and within 360 days    Creatinine, Ser  Date Value Ref Range Status  07/18/2021 0.99 0.76 - 1.27 mg/dL Final         Passed - HCT in normal range and within 360 days    Hematocrit  Date Value Ref Range Status  07/18/2021 42.8 37.5 - 51.0 % Final         Passed - HGB in normal range and within 360 days    Hemoglobin  Date Value Ref Range Status  07/18/2021 14.3 13.0 - 17.7 g/dL Final         Passed - PLT in normal range and within 360 days    Platelets  Date Value Ref Range Status  07/18/2021 152 150 - 450 x10E3/uL Final         Passed - eGFR is 15 or above and within 360 days    GFR calc Af Amer  Date Value Ref Range Status  12/27/2019 91 >59 mL/min/1.73 Final    Comment:    **[facility] currently reports eGFR in compliance with the current**   recommendations of the [employer]. [facility] will   update reporting as new guidelines are published from the NKF-ASN   Task force.    GFR calc non Af Amer  Date Value Ref Range Status  12/27/2019 79 >59 mL/min/1.73 Final   eGFR  Date Value Ref Range Status  07/18/2021 81 >59 mL/min/1.73 Final         Passed - Patient is not pregnant      Passed - Valid encounter within last 12 months    Recent Outpatient Visits          3 months ago Upper respiratory tract infection, unspecified type   [place], Megan P, DO   4 months ago Routine general medical  examination at a health care facility   [facility], Megan P, DO   9 months ago Inability to walk   [facility], [place], DO   10 months ago Primary hypertension   [place], [place], DO   1 year ago Benign prostatic hyperplasia with urinary frequency   [facility] [name], DO      Future Appointments            In 1 month [name], Barb Merino, DO [place], [place]   In 2 months Gollan, Kathlene November, MD [facility], [facility]   In 5 months Ralene Bathe, MD [place]           . simvastatin (ZOCOR) 40 MG tablet [Pharmacy Med Name: SIMVASTATIN 40 MG Tablet] 90 tablet 1    Sig: TAKE 1 TABLET  EVERY DAY     Cardiovascular:  Antilipid - Statins Failed - 11/28/2021  2:40 AM      Failed - Lipid Panel in normal range within the last 12 months    Cholesterol, Total  Date Value Ref Range Status  07/18/2021 129 100 - 199 mg/dL Final   Cholesterol Piccolo, Waived  Date Value Ref Range Status  03/01/2015 136 <200 mg/dL Final    Comment:                            Desirable                <200                         Borderline High      200- 239                         High                     >239    LDL Chol Calc (NIH)  Date Value Ref Range Status  07/18/2021 63 0 - 99 mg/dL Final   HDL  Date Value Ref Range Status  07/18/2021 53 >39 mg/dL Final   Triglycerides  Date Value Ref Range Status  07/18/2021 62 0 - 149 mg/dL Final   Triglycerides Piccolo,Waived  Date Value Ref Range Status  03/01/2015 74 <150 mg/dL Final    Comment:                            Normal                   <150                         Borderline High     150 - 199                         High                200 - 499                         Very High                >499          Passed - Patient is not pregnant      Passed - Valid encounter within last 12 months     Recent Outpatient Visits          3 months ago Upper respiratory tract infection, unspecified type   [place], Megan P, DO   4 months ago Routine general medical examination at a health care facility   [facility], Megan P, DO   9 months ago Inability to walk   [facility], Megan P, DO   10 months ago Primary hypertension   [place], [place], DO   1 year ago Benign prostatic hyperplasia with urinary frequency   [place], Barb Merino, DO      Future Appointments            In 1 month Johnson, Megan P, DO [facility], [place]   In 2 months Gollan, Kathlene November, MD [facility], [facility]   In 5 months Ralene Bathe, MD [place]

## 2021-12-02 ENCOUNTER — Telehealth: Payer: Self-pay | Admitting: Family Medicine

## 2021-12-02 DIAGNOSIS — I480 Paroxysmal atrial fibrillation: Secondary | ICD-10-CM

## 2021-12-02 NOTE — Telephone Encounter (Signed)
Please advise 

## 2021-12-02 NOTE — Telephone Encounter (Signed)
Caller wanted to inform PCP patient currently has a 30 day supply of XARELTO 20 MG TABS tablet and when the 30 day supply is out she would like PCP to send in a alternate due to the cost being $400. Caller would like to know the name of the alternate prior to PCP sending in new script. Caller would like a 90 day supply sent in to the mail order.      [place], [place] Phone:  [phone]  Fax:  [phone]

## 2021-12-04 DIAGNOSIS — Z6832 Body mass index (BMI) 32.0-32.9, adult: Secondary | ICD-10-CM | POA: Diagnosis not present

## 2021-12-04 DIAGNOSIS — M4126 Other idiopathic scoliosis, lumbar region: Secondary | ICD-10-CM | POA: Diagnosis not present

## 2021-12-04 DIAGNOSIS — M4300 Spondylolysis, site unspecified: Secondary | ICD-10-CM | POA: Diagnosis not present

## 2021-12-04 NOTE — Telephone Encounter (Signed)
I will see if the pharmacist can help [place] figure out what the best cost will be. They will call him

## 2021-12-04 NOTE — Telephone Encounter (Signed)
Returned patient call and notified wife of provider advise, per wife she doesn't think they will be approved for help with the pharmacy team but will give it a try.

## 2021-12-08 ENCOUNTER — Other Ambulatory Visit: Payer: Self-pay | Admitting: Family Medicine

## 2021-12-09 NOTE — Telephone Encounter (Signed)
Requested Prescriptions  Pending Prescriptions Disp Refills  . tamsulosin (FLOMAX) 0.4 MG CAPS capsule [Pharmacy Med Name: TAMSULOSIN HYDROCHLORIDE 0.4 MG Capsule] 90 capsule 1    Sig: TAKE 1 CAPSULE (0.4 MG TOTAL) BY MOUTH DAILY.     Urology: Alpha-Adrenergic Blocker Passed - 12/08/2021  3:02 AM      Passed - PSA in normal range and within 360 days    Prostate Specific Ag, Serum  Date Value Ref Range Status  07/18/2021 1.4 0.0 - 4.0 ng/mL Final    Comment:    Roche ECLIA methodology. According to the American Urological Association, Serum PSA should decrease and remain at undetectable levels after radical prostatectomy. The AUA defines biochemical recurrence as an initial PSA value 0.2 ng/mL or greater followed by a subsequent confirmatory PSA value 0.2 ng/mL or greater. Values obtained with different assay methods or kits cannot be used interchangeably. Results cannot be interpreted as absolute evidence of the presence or absence of malignant disease.          Passed - Last BP in normal range    BP Readings from Last 1 Encounters:  09/16/21 112/72         Passed - Valid encounter within last 12 months    Recent Outpatient Visits          3 months ago Upper respiratory tract infection, unspecified type   [place], Megan P, DO   4 months ago Routine general medical examination at a health care facility   [facility], [place] P, DO   9 months ago Inability to walk   [facility], [place], DO   11 months ago Primary hypertension   [place], [place], DO   1 year ago Benign prostatic hyperplasia with urinary frequency   [place], Barb Merino, DO      Future Appointments            In 1 month Johnson, Barb Merino, DO [place], [facility]   In 1 month Gollan, Kathlene November, MD [place]. [place]   In 5 months Ralene Bathe, MD [place]           . amLODipine (NORVASC) 2.5 MG tablet [Pharmacy Med Name: AMLODIPINE BESYLATE 2.5 MG Tablet] 90 tablet 1    Sig: TAKE 1 TABLET (2.5 MG TOTAL) BY MOUTH DAILY AS NEEDED (BLOOD PRESSURE GREATER THAN 160/90). TAKE IN THE MORNING     Cardiovascular: Calcium Channel Blockers 2 Passed - 12/08/2021  3:02 AM      Passed - Last BP in normal range    BP Readings from Last 1 Encounters:  09/16/21 112/72         Passed - Last Heart Rate in normal range    Pulse Readings from Last 1 Encounters:  09/16/21 62         Passed - Valid encounter within last 6 months    Recent Outpatient Visits          3 months ago Upper respiratory tract infection, unspecified type   [place], Megan P, DO   4 months ago Routine general medical examination at a health care facility   [facility], [place] P, DO   9 months ago Inability to walk   [facility], Megan P, DO   11 months ago Primary hypertension  [place], Megan P, DO   1 year ago Benign prostatic hyperplasia with urinary frequency   [place], Barb Merino, DO      Future Appointments            In 1 month Johnson, Barb Merino, DO [place], [facility]   In 1 month Gollan, Kathlene November, MD [place]. [place]   In 5 months Ralene Bathe, MD [place]

## 2021-12-12 ENCOUNTER — Other Ambulatory Visit: Payer: Self-pay | Admitting: Neurological Surgery

## 2021-12-12 ENCOUNTER — Ambulatory Visit
Admission: RE | Admit: 2021-12-12 | Discharge: 2021-12-12 | Disposition: A | Discharge status: Home / Self Care | Payer: Medicare HMO | Source: Ambulatory Visit | Attending: Neurological Surgery | Admitting: Neurological Surgery

## 2021-12-12 DIAGNOSIS — M4126 Other idiopathic scoliosis, lumbar region: Secondary | ICD-10-CM

## 2021-12-12 DIAGNOSIS — M545 Low back pain, unspecified: Secondary | ICD-10-CM | POA: Diagnosis not present

## 2021-12-13 ENCOUNTER — Encounter: Payer: Self-pay | Admitting: Family Medicine

## 2021-12-17 ENCOUNTER — Telehealth: Payer: Self-pay | Admitting: Neurology

## 2021-12-17 NOTE — Telephone Encounter (Signed)
Patient needs a refill sent to [name] [place] for the ropinirole 3 mg. Patient wife states that he needs a  90 day supply  - 270 pills

## 2021-12-17 NOTE — Telephone Encounter (Signed)
Pt not approved for alternate / wants something else, still has a month's supply but wants substitute called in now so can make sure gets approved, fu wit pt wife [phone]

## 2021-12-19 ENCOUNTER — Other Ambulatory Visit: Payer: Self-pay

## 2021-12-19 DIAGNOSIS — G20 Parkinson's disease: Secondary | ICD-10-CM

## 2021-12-19 MED ORDER — ROPINIROLE HCL 3 MG PO TABS: ORAL_TABLET | ORAL | 0 refills | Status: DC

## 2021-12-19 NOTE — Telephone Encounter (Signed)
Pt's wife called saying do not try to find a substitute right now   they are trying to appeal the denial and he has enough medication for a couple of weeks.

## 2021-12-23 ENCOUNTER — Telehealth: Payer: Self-pay | Admitting: Family Medicine

## 2021-12-23 NOTE — Telephone Encounter (Signed)
Pt spouse brought in [place] for Redetermination of Medicare Prescription Drug Denial for (XARELTO '20mg'$ /90qty/1 a day) to be signed by provider. Spouse stated that she needs this done before medication runs out.    I explained that the provider was out of the office for the week. And another provider may not be able to sign forms. She would like the original form mailed to address listed when form has been faxed.   Forms were placed in the provider folder.

## 2021-12-24 ENCOUNTER — Other Ambulatory Visit: Payer: Self-pay | Admitting: Neurology

## 2021-12-24 ENCOUNTER — Telehealth: Payer: Self-pay

## 2021-12-24 DIAGNOSIS — G20 Parkinson's disease: Secondary | ICD-10-CM

## 2021-12-24 DIAGNOSIS — G249 Dystonia, unspecified: Secondary | ICD-10-CM

## 2021-12-24 NOTE — Telephone Encounter (Signed)
Copied from [place] [phone]. Topic: General - Inquiry >> Dec 24, 2021 12:31 PM Marcellus Scott wrote: Reason for CRM: Pt wife is returning [employer] call.   Pt wife stated she has no idea what this is about. Stated she dropped off an appeal for medication Xarelto yesterday and is wondering if this is why she is calling. However, Appeal needed PCP signature.   Please advise.

## 2021-12-24 NOTE — Telephone Encounter (Signed)
Paperwork signed by Santiago Glad. Faxed forms back to [facility] as requested.   Called and LVM asking for patients wife to please return my call. Need to clarify if she wants the paperwork to her home address.

## 2021-12-24 NOTE — Chronic Care Management (AMB) (Unsigned)
  Chronic Care Management   Note  12/24/2021 Name: Takeo Harts MRN: [id number] DOB: [birth year]  Thanos Cousineau is a 71 y.o. year old male who is a primary care patient of [name], DO. Perley Arthurs is currently enrolled in care management services. An additional referral for pharm d  was placed.   Follow up plan: Unsuccessful telephone outreach attempt made. A HIPAA compliant phone message was left for the patient providing contact information and requesting a return call.  The care management team will reach out to the patient again over the next 7 days.  If patient returns call to provider office, please advise to call [facility]  at [place], [place], [place] [address] Direct Dial: [phone] [email or web address]

## 2021-12-25 NOTE — Telephone Encounter (Signed)
Paperwork mailed to the patient's home.

## 2021-12-25 NOTE — Telephone Encounter (Signed)
Pt wife returned call stated yes, please mail paperwork to her home address.    Please advise.

## 2021-12-25 NOTE — Chronic Care Management (AMB) (Addendum)
  Chronic Care Management   Note  12/25/2021 Name: Adewale Pucillo MRN: [id number] DOB: 01/12/1951  Wentworth Edelen is a 71 y.o. year old male who is a primary care patient of [name], DO. I reached out to [name] by phone today in response to a referral sent by Mr. Belenda Cruise Bann's PCP.  Mr. Leppo was given information about Chronic Care Management services today including:  CCM service includes personalized support from designated clinical staff supervised by his physician, including individualized plan of care and coordination with other care providers 24/7 contact phone numbers for assistance for urgent and routine care needs. Service will only be billed when office clinical staff spend 20 minutes or more in a month to coordinate care. Only one practitioner may furnish and bill the service in a calendar month. The patient may stop CCM services at any time (effective at the end of the month) by phone call to the office staff. The patient is responsible for co-pay (up to 20% after annual deductible is met) if co-pay is required by the individual health plan.   Patient did not agree to enrollment in care management services and does not wish to consider at this time.  Follow up plan: Patient declines further follow up and engagement by the care management team. Appropriate care team members and provider have been notified via electronic communication.   Noreene Larsson, [place], [place] [address] Direct Dial: [phone] [email or web address]

## 2021-12-30 ENCOUNTER — Other Ambulatory Visit: Payer: Self-pay | Admitting: Family Medicine

## 2021-12-30 NOTE — Telephone Encounter (Signed)
Pt spouse stated that they have tried to get the Rx transferred to [name] but [facility] will not transfer it. Pt spouse requests that the Rx be sent to [name]  Medication Refill - Medication: simvastatin (ZOCOR) 40 MG tablet  Has the patient contacted their pharmacy? Yes.    Preferred Pharmacy (with phone number or street name):  [name] [id number], [place] Phone:  [phone]  Fax:  [phone]     Has the patient been seen for an appointment in the last year OR does the patient have an upcoming appointment? Yes.    Agent: Please be advised that RX refills may take up to 3 business days. We ask that you follow-up with your pharmacy.

## 2021-12-31 MED ORDER — SIMVASTATIN 40 MG PO TABS: 40.0000 mg | ORAL_TABLET | Freq: Every day | ORAL | 2 refills | Status: DC

## 2021-12-31 NOTE — Telephone Encounter (Signed)
Requested Prescriptions  Pending Prescriptions Disp Refills  . simvastatin (ZOCOR) 40 MG tablet 90 tablet 2    Sig: Take 1 tablet (40 mg total) by mouth daily.     Cardiovascular:  Antilipid - Statins Failed - 12/30/2021  9:59 PM      Failed - Lipid Panel in normal range within the last 12 months    Cholesterol, Total  Date Value Ref Range Status  07/18/2021 129 100 - 199 mg/dL Final   Cholesterol Piccolo, Waived  Date Value Ref Range Status  03/01/2015 136 <200 mg/dL Final    Comment:                            Desirable                <200                         Borderline High      200- 239                         High                     >239    LDL Chol Calc (NIH)  Date Value Ref Range Status  07/18/2021 63 0 - 99 mg/dL Final   HDL  Date Value Ref Range Status  07/18/2021 53 >39 mg/dL Final   Triglycerides  Date Value Ref Range Status  07/18/2021 62 0 - 149 mg/dL Final   Triglycerides Piccolo,Waived  Date Value Ref Range Status  03/01/2015 74 <150 mg/dL Final    Comment:                            Normal                   <150                         Borderline High     150 - 199                         High                200 - 499                         Very High                >499          Passed - Patient is not pregnant      Passed - Valid encounter within last 12 months    Recent Outpatient Visits          4 months ago Upper respiratory tract infection, unspecified type   [place], Megan P, DO   5 months ago Routine general medical examination at a health care facility   [facility], Megan P, DO   10 months ago Inability to walk   [facility], [place], DO   11 months ago Primary hypertension   [place], Megan P, DO   1 year ago Benign prostatic hyperplasia with urinary frequency   [place], [place], DO  Future Appointments             In 3 weeks Johnson, Barb Merino, DO [facility], [facility]   In 1 month Gollan, Kathlene November, MD [place]. [place]   In 4 months Ralene Bathe, MD [place]

## 2022-01-01 ENCOUNTER — Other Ambulatory Visit: Payer: Self-pay | Admitting: Family Medicine

## 2022-01-01 NOTE — Telephone Encounter (Signed)
Copied from [place] [id number]. Topic: General - Inquiry >> Jan 01, 2022  2:55 PM Marcellus Scott wrote: Reason for CRM: Pt's wife called, requesting to speak with Thomas [name], RMA. Thomas [name] stated Thomas [name] is looking into helping them find financial assistance for medication.  Pt wife is requesting a call back. She asked if it was possible to make Dr.Johnson aware that medication is very expensive and pt is running low. Thomas [name] asked if there was an alternative medication.   Please advise.

## 2022-01-02 DIAGNOSIS — R262 Difficulty in walking, not elsewhere classified: Secondary | ICD-10-CM | POA: Diagnosis not present

## 2022-01-02 DIAGNOSIS — M5459 Other low back pain: Secondary | ICD-10-CM | POA: Diagnosis not present

## 2022-01-02 NOTE — Telephone Encounter (Signed)
If he wants to start warfarin- he will need to come in for an appointment.

## 2022-01-02 NOTE — Telephone Encounter (Signed)
The only alternative medication would be warfarin for which he would need to have appointments monthly to monitor the levels. Is she sure she doesn't want to see about financial assistance?

## 2022-01-03 NOTE — Telephone Encounter (Signed)
Patient spouse, [name] states they have agreed to continue Xarelto and pay the cost for it. Please send new prescription to [facility], medication was cancelled and discontinued by patient due to previous intent to change medication.

## 2022-01-06 DIAGNOSIS — R262 Difficulty in walking, not elsewhere classified: Secondary | ICD-10-CM | POA: Diagnosis not present

## 2022-01-06 DIAGNOSIS — M5459 Other low back pain: Secondary | ICD-10-CM | POA: Diagnosis not present

## 2022-01-06 MED ORDER — RIVAROXABAN 20 MG PO TABS: 20.0000 mg | ORAL_TABLET | Freq: Every day | ORAL | 1 refills | Status: DC

## 2022-01-09 DIAGNOSIS — R262 Difficulty in walking, not elsewhere classified: Secondary | ICD-10-CM | POA: Diagnosis not present

## 2022-01-09 DIAGNOSIS — M5459 Other low back pain: Secondary | ICD-10-CM | POA: Diagnosis not present

## 2022-01-13 DIAGNOSIS — R262 Difficulty in walking, not elsewhere classified: Secondary | ICD-10-CM | POA: Diagnosis not present

## 2022-01-13 DIAGNOSIS — M5459 Other low back pain: Secondary | ICD-10-CM | POA: Diagnosis not present

## 2022-01-15 ENCOUNTER — Telehealth: Payer: Self-pay | Admitting: Neurology

## 2022-01-15 NOTE — Telephone Encounter (Signed)
[facility] is not using [facility] anymore. He needs his carbi/levo 25-100 sent to [facility] in [place].

## 2022-01-16 ENCOUNTER — Other Ambulatory Visit: Payer: Self-pay

## 2022-01-16 DIAGNOSIS — M5459 Other low back pain: Secondary | ICD-10-CM | POA: Diagnosis not present

## 2022-01-16 DIAGNOSIS — R262 Difficulty in walking, not elsewhere classified: Secondary | ICD-10-CM | POA: Diagnosis not present

## 2022-01-16 DIAGNOSIS — G20A1 Parkinson's disease without dyskinesia, without mention of fluctuations: Secondary | ICD-10-CM

## 2022-01-16 MED ORDER — CARBIDOPA-LEVODOPA 25-100 MG PO TABS: ORAL_TABLET | ORAL | 0 refills | Status: DC

## 2022-01-16 NOTE — Telephone Encounter (Signed)
Sent prescription in for carbidopa levodopa to [facility] in [place] called patients wife to let her know its been taken care of

## 2022-01-18 ENCOUNTER — Other Ambulatory Visit: Payer: Self-pay | Admitting: Neurology

## 2022-01-20 DIAGNOSIS — M5459 Other low back pain: Secondary | ICD-10-CM | POA: Diagnosis not present

## 2022-01-20 DIAGNOSIS — R262 Difficulty in walking, not elsewhere classified: Secondary | ICD-10-CM | POA: Diagnosis not present

## 2022-01-21 ENCOUNTER — Ambulatory Visit: Payer: Medicare HMO | Admitting: Family Medicine

## 2022-01-22 ENCOUNTER — Ambulatory Visit (INDEPENDENT_AMBULATORY_CARE_PROVIDER_SITE_OTHER): Payer: Medicare HMO | Admitting: Family Medicine

## 2022-01-22 ENCOUNTER — Encounter: Payer: Self-pay | Admitting: Family Medicine

## 2022-01-22 VITALS — BP 121/77 | HR 59 | Temp 98.1°F | Wt 223.6 lb

## 2022-01-22 DIAGNOSIS — E7800 Pure hypercholesterolemia, unspecified: Secondary | ICD-10-CM | POA: Diagnosis not present

## 2022-01-22 DIAGNOSIS — G20A1 Parkinson's disease without dyskinesia, without mention of fluctuations: Secondary | ICD-10-CM | POA: Diagnosis not present

## 2022-01-22 DIAGNOSIS — I480 Paroxysmal atrial fibrillation: Secondary | ICD-10-CM

## 2022-01-22 DIAGNOSIS — Z23 Encounter for immunization: Secondary | ICD-10-CM

## 2022-01-22 DIAGNOSIS — F329 Major depressive disorder, single episode, unspecified: Secondary | ICD-10-CM | POA: Insufficient documentation

## 2022-01-22 DIAGNOSIS — I10 Essential (primary) hypertension: Secondary | ICD-10-CM | POA: Diagnosis not present

## 2022-01-22 DIAGNOSIS — N401 Enlarged prostate with lower urinary tract symptoms: Secondary | ICD-10-CM

## 2022-01-22 DIAGNOSIS — R350 Frequency of micturition: Secondary | ICD-10-CM

## 2022-01-22 DIAGNOSIS — I2782 Chronic pulmonary embolism: Secondary | ICD-10-CM | POA: Diagnosis not present

## 2022-01-22 DIAGNOSIS — F330 Major depressive disorder, recurrent, mild: Secondary | ICD-10-CM

## 2022-01-22 HISTORY — DX: Major depressive disorder, single episode, unspecified: F32.9

## 2022-01-22 MED ORDER — TAMSULOSIN HCL 0.4 MG PO CAPS
0.4000 mg | ORAL_CAPSULE | Freq: Every day | ORAL | 1 refills | Status: DC
Start: 2022-01-22 — End: 2022-07-28

## 2022-01-22 MED ORDER — LISINOPRIL 30 MG PO TABS: 30.0000 mg | ORAL_TABLET | Freq: Every day | ORAL | 1 refills | Status: DC

## 2022-01-22 MED ORDER — METOPROLOL SUCCINATE ER 25 MG PO TB24: 25.0000 mg | ORAL_TABLET | Freq: Every day | ORAL | 1 refills | Status: DC

## 2022-01-22 MED ORDER — AMLODIPINE BESYLATE 2.5 MG PO TABS
ORAL_TABLET | ORAL | 1 refills | Status: DC
Start: 2022-01-22 — End: 2022-07-28

## 2022-01-22 MED ORDER — TRAZODONE HCL 50 MG PO TABS: 50.0000 mg | ORAL_TABLET | Freq: Every day | ORAL | 1 refills | Status: DC

## 2022-01-22 NOTE — Assessment & Plan Note (Addendum)
Under good control on current regimen. Continue current regimen. Continue to monitor. Call with any concerns. Refills given.   

## 2022-01-22 NOTE — Assessment & Plan Note (Signed)
Under good control on current regimen. Continue current regimen. Continue to monitor. Call with any concerns. Refills given. Labs drawn today.   

## 2022-01-22 NOTE — Assessment & Plan Note (Signed)
Getting worse. Effecting his mood. Continue to follow with neurology. Call with any concerns.

## 2022-01-22 NOTE — Assessment & Plan Note (Signed)
Acting up again. Discussed medication- he will consider. Call with any concerns.

## 2022-01-22 NOTE — Progress Notes (Signed)
 BP 121/77   Pulse (!) 59   Temp 98.1 F (36.7 C)   Wt 223 lb 9.6 oz (101.4 kg)   SpO2 97%   BMI 35.02 kg/m    Subjective:    Patient ID: Thomas [name], male    DOB: 11/01/1950, 71 y.o.   MRN: [id number]  HPI: Thomas [name] is a 71 y.o. male  Chief Complaint  Patient presents with   Hypertension   Benign Prostatic Hypertrophy   Hyperlipidemia   HYPERTENSION / HYPERLIPIDEMIA Satisfied with current treatment? yes Duration of hypertension: chronic BP monitoring frequency: not checking BP medication side effects: no Past BP meds: hydralazine, amlodipine, lisinopril, metoprolol Duration of hyperlipidemia: chronic Cholesterol medication side effects: no Cholesterol supplements: none Past cholesterol medications: simvastatin Medication compliance: excellent compliance Aspirin: no Recent stressors: no Recurrent headaches: no Visual changes: no Palpitations: no Dyspnea: no Chest pain: no Lower extremity edema: no Dizzy/lightheaded: no  Feels like his parkinson's has been doing worse. Has been following with neurology. Continues PT and voice therapy through the gym he goes to. Does not want to do formal speech therapy at this time.   Relevant past medical, surgical, family and social history reviewed and updated as indicated. Interim medical history since our last visit reviewed. Allergies and medications reviewed and updated.  Review of Systems  Constitutional:  Positive for fatigue. Negative for activity change, appetite change, chills, diaphoresis, fever and unexpected weight change.  Respiratory: Negative.    Cardiovascular: Negative.   Gastrointestinal: Negative.   Musculoskeletal: Negative.   Neurological:  Positive for weakness. Negative for dizziness, tremors, seizures, syncope, facial asymmetry, speech difficulty, light-headedness, numbness and headaches.  Psychiatric/Behavioral: Negative.      Per HPI unless specifically indicated above     Objective:     BP 121/77   Pulse (!) 59   Temp 98.1 F (36.7 C)   Wt 223 lb 9.6 oz (101.4 kg)   SpO2 97%   BMI 35.02 kg/m   Wt Readings from Last 3 Encounters:  01/22/22 223 lb 9.6 oz (101.4 kg)  09/16/21 226 lb (102.5 kg)  08/15/21 226 lb (102.5 kg)    Physical Exam Vitals and nursing note reviewed.  Constitutional:      General: He is not in acute distress.    Appearance: Normal appearance. He is normal weight. He is not ill-appearing, toxic-appearing or diaphoretic.  HENT:     Head: Normocephalic and atraumatic.     Right Ear: External ear normal.     Left Ear: External ear normal.     Nose: Nose normal.     Mouth/Throat:     Mouth: Mucous membranes are moist.     Pharynx: Oropharynx is clear.  Eyes:     General: No scleral icterus.       Right eye: No discharge.        Left eye: No discharge.     Extraocular Movements: Extraocular movements intact.     Conjunctiva/sclera: Conjunctivae normal.     Pupils: Pupils are equal, round, and reactive to light.  Cardiovascular:     Rate and Rhythm: Normal rate and regular rhythm.     Pulses: Normal pulses.     Heart sounds: Normal heart sounds. No murmur heard.    No friction rub. No gallop.  Pulmonary:     Effort: Pulmonary effort is normal. No respiratory distress.     Breath sounds: Normal breath sounds. No stridor. No wheezing, rhonchi or rales.  Chest:       Chest wall: No tenderness.  Musculoskeletal:        General: Normal range of motion.     Cervical back: Normal range of motion and neck supple.  Skin:    General: Skin is warm and dry.     Capillary Refill: Capillary refill takes less than 2 seconds.     Coloration: Skin is not jaundiced or pale.     Findings: No bruising, erythema, lesion or rash.  Neurological:     General: No focal deficit present.     Mental Status: He is alert and oriented to person, place, and time. Mental status is at baseline.  Psychiatric:        Mood and Affect: Mood normal.        Behavior:  Behavior normal.        Thought Content: Thought content normal.        Judgment: Judgment normal.     Results for orders placed or performed in visit on 01/22/22  Comprehensive metabolic panel  Result Value Ref Range   Glucose 98 70 - 99 mg/dL   BUN 17 8 - 27 mg/dL   Creatinine, Ser 1.10 0.76 - 1.27 mg/dL   eGFR 71 >59 mL/min/1.73   BUN/Creatinine Ratio 15 10 - 24   Sodium 139 134 - 144 mmol/L   Potassium 4.4 3.5 - 5.2 mmol/L   Chloride 102 96 - 106 mmol/L   CO2 22 20 - 29 mmol/L   Calcium 9.2 8.6 - 10.2 mg/dL   Total Protein 6.8 6.0 - 8.5 g/dL   Albumin 4.2 3.8 - 4.8 g/dL   Globulin, Total 2.6 1.5 - 4.5 g/dL   Albumin/Globulin Ratio 1.6 1.2 - 2.2   Bilirubin Total 0.6 0.0 - 1.2 mg/dL   Alkaline Phosphatase 101 44 - 121 IU/L   AST 9 0 - 40 IU/L   ALT 5 0 - 44 IU/L  Lipid Panel w/o Chol/HDL Ratio  Result Value Ref Range   Cholesterol, Total 120 100 - 199 mg/dL   Triglycerides 48 0 - 149 mg/dL   HDL 49 >39 mg/dL   VLDL Cholesterol Cal 11 5 - 40 mg/dL   LDL Chol Calc (NIH) 60 0 - 99 mg/dL  CBC with Differential/Platelet  Result Value Ref Range   WBC 5.6 3.4 - 10.8 x10E3/uL   RBC 4.78 4.14 - 5.80 x10E6/uL   Hemoglobin 14.2 13.0 - 17.7 g/dL   Hematocrit 43.7 37.5 - 51.0 %   MCV 91 79 - 97 fL   MCH 29.7 26.6 - 33.0 pg   MCHC 32.5 31.5 - 35.7 g/dL   RDW 12.5 11.6 - 15.4 %   Platelets 156 150 - 450 x10E3/uL   Neutrophils 77 Not Estab. %   Lymphs 14 Not Estab. %   Monocytes 7 Not Estab. %   Eos 1 Not Estab. %   Basos 1 Not Estab. %   Neutrophils Absolute 4.4 1.4 - 7.0 x10E3/uL   Lymphocytes Absolute 0.8 0.7 - 3.1 x10E3/uL   Monocytes Absolute 0.4 0.1 - 0.9 x10E3/uL   EOS (ABSOLUTE) 0.1 0.0 - 0.4 x10E3/uL   Basophils Absolute 0.0 0.0 - 0.2 x10E3/uL   Immature Granulocytes 0 Not Estab. %   Immature Grans (Abs) 0.0 0.0 - 0.1 x10E3/uL      Assessment & Plan:   Problem List Items Addressed This Visit       Cardiovascular and Mediastinum   Hypertension    Under  good control on current regimen. Continue current  regimen. Continue to monitor. Call with any concerns. Refills given. Labs drawn today.       Relevant Medications   metoprolol succinate (TOPROL-XL) 25 MG 24 hr tablet   lisinopril (ZESTRIL) 30 MG tablet   amLODipine (NORVASC) 2.5 MG tablet   Other Relevant Orders   Comprehensive metabolic panel (Completed)   Atrial fibrillation ([place])    Under good control on current regimen. Continue current regimen. Continue to monitor. Call with any concerns. Refills given. Continue to follow with cardiology.       Relevant Medications   metoprolol succinate (TOPROL-XL) 25 MG 24 hr tablet   lisinopril (ZESTRIL) 30 MG tablet   amLODipine (NORVASC) 2.5 MG tablet   Other Relevant Orders   CBC with Differential/Platelet (Completed)     Nervous and Auditory   Parkinson's disease    Getting worse. Effecting his mood. Continue to follow with neurology. Call with any concerns.         Genitourinary   BPH (benign prostatic hyperplasia)    Under good control on current regimen. Continue current regimen. Continue to monitor. Call with any concerns. Refills given.       Relevant Medications   tamsulosin (FLOMAX) 0.4 MG CAPS capsule     Other   Hypercholesterolemia    Under good control on current regimen. Continue current regimen. Continue to monitor. Call with any concerns. Refills given. Labs drawn today.       Relevant Medications   metoprolol succinate (TOPROL-XL) 25 MG 24 hr tablet   lisinopril (ZESTRIL) 30 MG tablet   amLODipine (NORVASC) 2.5 MG tablet   Other Relevant Orders   Comprehensive metabolic panel (Completed)   Lipid Panel w/o Chol/HDL Ratio (Completed)   Depression, major, recurrent, mild (HCC)    Acting up again. Discussed medication- he will consider. Call with any concerns.       Relevant Medications   traZODone (DESYREL) 50 MG tablet   Other Visit Diagnoses     Need for influenza vaccination    -  Primary    Relevant Orders   Flu Vaccine QUAD High Dose(Fluad) (Completed)        Follow up plan: Return in about 6 months (around 07/24/2022).

## 2022-01-23 ENCOUNTER — Encounter: Payer: Self-pay | Admitting: Family Medicine

## 2022-01-23 LAB — CBC WITH DIFFERENTIAL/PLATELET
Basophils Absolute: 0 10*3/uL (ref 0.0–0.2)
Basos: 1 %
EOS (ABSOLUTE): 0.1 10*3/uL (ref 0.0–0.4)
Eos: 1 %
Hematocrit: 43.7 % (ref 37.5–51.0)
Hemoglobin: 14.2 g/dL (ref 13.0–17.7)
Immature Grans (Abs): 0 10*3/uL (ref 0.0–0.1)
Immature Granulocytes: 0 %
Lymphocytes Absolute: 0.8 10*3/uL (ref 0.7–3.1)
Lymphs: 14 %
MCH: 29.7 pg (ref 26.6–33.0)
MCHC: 32.5 g/dL (ref 31.5–35.7)
MCV: 91 fL (ref 79–97)
Monocytes Absolute: 0.4 10*3/uL (ref 0.1–0.9)
Monocytes: 7 %
Neutrophils Absolute: 4.4 10*3/uL (ref 1.4–7.0)
Neutrophils: 77 %
Platelets: 156 10*3/uL (ref 150–450)
RBC: 4.78 x10E6/uL (ref 4.14–5.80)
RDW: 12.5 % (ref 11.6–15.4)
WBC: 5.6 10*3/uL (ref 3.4–10.8)

## 2022-01-23 LAB — COMPREHENSIVE METABOLIC PANEL
ALT: 5 IU/L (ref 0–44)
AST: 9 IU/L (ref 0–40)
Albumin/Globulin Ratio: 1.6 (ref 1.2–2.2)
Albumin: 4.2 g/dL (ref 3.8–4.8)
Alkaline Phosphatase: 101 IU/L (ref 44–121)
BUN/Creatinine Ratio: 15 (ref 10–24)
BUN: 17 mg/dL (ref 8–27)
Bilirubin Total: 0.6 mg/dL (ref 0.0–1.2)
CO2: 22 mmol/L (ref 20–29)
Calcium: 9.2 mg/dL (ref 8.6–10.2)
Chloride: 102 mmol/L (ref 96–106)
Creatinine, Ser: 1.1 mg/dL (ref 0.76–1.27)
Globulin, Total: 2.6 g/dL (ref 1.5–4.5)
Glucose: 98 mg/dL (ref 70–99)
Potassium: 4.4 mmol/L (ref 3.5–5.2)
Sodium: 139 mmol/L (ref 134–144)
Total Protein: 6.8 g/dL (ref 6.0–8.5)
eGFR: 71 mL/min/{1.73_m2} (ref >59)

## 2022-01-23 LAB — LIPID PANEL W/O CHOL/HDL RATIO
Cholesterol, Total: 120 mg/dL (ref 100–199)
HDL: 49 mg/dL (ref >39)
LDL Chol Calc (NIH): 60 mg/dL (ref 0–99)
Triglycerides: 48 mg/dL (ref 0–149)
VLDL Cholesterol Cal: 11 mg/dL (ref 5–40)

## 2022-01-24 DIAGNOSIS — M5459 Other low back pain: Secondary | ICD-10-CM | POA: Diagnosis not present

## 2022-01-24 DIAGNOSIS — M4126 Other idiopathic scoliosis, lumbar region: Secondary | ICD-10-CM | POA: Diagnosis not present

## 2022-01-24 DIAGNOSIS — R262 Difficulty in walking, not elsewhere classified: Secondary | ICD-10-CM | POA: Diagnosis not present

## 2022-01-27 DIAGNOSIS — R262 Difficulty in walking, not elsewhere classified: Secondary | ICD-10-CM | POA: Diagnosis not present

## 2022-01-27 DIAGNOSIS — M5459 Other low back pain: Secondary | ICD-10-CM | POA: Diagnosis not present

## 2022-01-28 NOTE — Assessment & Plan Note (Signed)
Under good control on current regimen. Continue current regimen. Continue to monitor. Call with any concerns. Refills given. Continue to follow with cardiology.

## 2022-01-29 ENCOUNTER — Other Ambulatory Visit: Payer: Self-pay | Admitting: Cardiology

## 2022-01-29 DIAGNOSIS — M4126 Other idiopathic scoliosis, lumbar region: Secondary | ICD-10-CM

## 2022-01-31 DIAGNOSIS — M5459 Other low back pain: Secondary | ICD-10-CM | POA: Diagnosis not present

## 2022-01-31 DIAGNOSIS — R262 Difficulty in walking, not elsewhere classified: Secondary | ICD-10-CM | POA: Diagnosis not present

## 2022-02-02 NOTE — Progress Notes (Unsigned)
Patient ID: Thomas [name], male   DOB: 1950/09/27, 71 y.o.   MRN: [id number] Cardiology Office Note  Date:  02/03/2022   ID:  Thomas [name], DOB [birth year], MRN [id number]  PCP:  Thomas Roys, DO   Chief Complaint  Patient presents with   12 month follow up     "Doing well." Medications reviewed by the patient verbally.     HPI:  71 yo male with a history of  DVT, PE on chronic anticoagulation Parkinson's,  paroxysmal atrial fibrillation,  on xarelto,  07/2015 DJD on CT scan thoracic spine No prior smoking history, no diabetes, cholesterol well controlled on a statin who presents for follow-up of his atrial fibrillation   Last seen in clinic by myself October 2022  On lisinopril and metoprolol in the afternoon at home Not taking amlodipine BP at home 132 to 440 systolic Denies orthostasis symptoms Blood pressure low on today's visit, feels fine  Weight down 8 pounds since last clinic visit  Continues to stay active Goes to rock steady boxing, 3x a week Gym 3x  Scheduled for MRI of back, giving him discomfort Followed by neurosurgery  EKG personally reviewed by myself on todays visit Sinus brady 54 bpm no significant ST or T wave changes  Lab work reviewed with him Lab Results  Component Value Date   CHOL 120 01/22/2022   HDL 49 01/22/2022   [place] 60 01/22/2022   TRIG 48 01/22/2022    Other past medical history reviewed 06/23/2014 Lower extremity ultrasound revealed persistent partially occlusive thrombus in the left posterior tibial, popliteal, and mid femoral veins.     01/02/2015 Left lower extremity duplex  revealed persistent but improving nonocclusive partial thrombus in the femoral and popliteal veins.  Calf veins were patient.    Chest CT on 06/23/2014 revealed no pulmonary nodules or adenopathy.    He did not do well on amiodarone, felt this made his tremor worse He is followed by neurology in [place], Thomas [name]  Previously seen in the  emergency room in the setting of upper respiratory infection, found to be in atrial fibrillation, converted in the emergency room after he was given amiodarone   PMH:   has a past medical history of AF (atrial fibrillation) ([place]), Allergy, Arthritis, Arthritis of knee, Atrial fibrillation ([place]), DVT (deep venous thrombosis) ([place]), Dysplastic nevus (05/15/2021), Dysrhythmia, History of kidney stones, Hyperchloremia, Hypercholesterolemia, Hypertension, Nephrolithiasis, Parkinson's disease, Plantar wart, Pneumonia, PONV (postoperative nausea and vomiting), Pulmonary embolism ([place]), Sciatica, Seasonal allergies, Stroke ([place]), and TIA (transient ischemic attack).  PSH:    Past Surgical History:  Procedure Laterality Date   APPENDECTOMY     CATARACT EXTRACTION Bilateral 10/2019 11/2019   COLONOSCOPY WITH PROPOFOL N/A 07/27/2019   Procedure: COLONOSCOPY WITH PROPOFOL;  Surgeon: Thomas Landsman, [name];  Location: [facility] ENDOSCOPY;  Service: Endoscopy;  Laterality: N/A;   EYE SURGERY     KNEE ARTHROSCOPY WITH MENISCAL REPAIR Right 2009   KNEE SURGERY Right    LUMBAR LAMINECTOMY/DECOMPRESSION MICRODISCECTOMY Left 10/11/2020   Procedure: Left Lumbar Three-Four, Lumbar Fou-Five  Microdiscectomy;  Surgeon: Thomas Levine, [name];  Location: [facility];  Service: Neurosurgery;  Laterality: Left;   melanoma removal Left 07/16/2021   chest   T&A     TONSILLECTOMY      Current Outpatient Medications  Medication Sig Dispense Refill   acetaminophen (TYLENOL) 500 MG tablet Take 1,000 mg by mouth daily.     amantadine (SYMMETREL) 100 MG capsule TAKE 1 CAPSULE THREE TIMES DAILY  270 capsule 0   amLODipine (NORVASC) 2.5 MG tablet TAKE 1 TABLET (2.5 MG TOTAL) BY MOUTH DAILY AS NEEDED (BLOOD PRESSURE GREATER THAN 160/90). TAKE IN THE MORNING 90 tablet 1   calcium carbonate (TUMS - DOSED IN MG ELEMENTAL CALCIUM) 500 MG chewable tablet Chew 1 tablet by mouth daily as needed for indigestion or heartburn.     carbidopa-levodopa  (SINEMET CR) 50-200 MG tablet TAKE ONE TABLET BY MOUTH AT BEDTIME 90 tablet 0   carbidopa-levodopa (SINEMET IR) 25-100 MG tablet TAKE 2 TABLETS FOUR TIMES DAILY - AT 6AM, 10AM, 2PM, AND 6PM 720 tablet 0   cetirizine (ZYRTEC) 10 MG tablet Take 10 mg by mouth at bedtime.     cholecalciferol (VITAMIN D3) 25 MCG (1000 UNIT) tablet Take 1,000 Units by mouth daily.     diazepam (VALIUM) 5 MG tablet Take 5 mg by mouth 2 (two) times daily as needed.     diclofenac Sodium (VOLTAREN) 1 % GEL APPLY TOPICALLY 4 GRAMS FOUR TIMES DAILY (Patient taking differently: Apply 4 g topically 4 (four) times daily as needed (pain).) 400 g 1   hydrALAZINE (APRESOLINE) 25 MG tablet Take 1 tablet (25 mg total) by mouth 3 (three) times daily as needed (for SBP >160). Takes in the evening 90 tablet 3   HYDROcodone-acetaminophen (NORCO/VICODIN) 5-325 MG tablet Take by mouth.     Levodopa (INBRIJA) 42 MG CAPS Samples of this drug were given to the patient, quantity 1 box of 32 capsules, Lot Number [id number] Exp: 03/2023  Patient has been educated on dosage and uses of medication. 32 capsule 0   lisinopril (ZESTRIL) 30 MG tablet Take 1 tablet (30 mg total) by mouth daily. 90 tablet 1   melatonin 5 MG TABS Take 5 mg by mouth at bedtime.     methocarbamol (ROBAXIN) 500 MG tablet Take 1 tablet (500 mg total) by mouth every 6 (six) hours as needed for muscle spasms. 90 tablet 0   metoprolol succinate (TOPROL-XL) 25 MG 24 hr tablet Take 1 tablet (25 mg total) by mouth at bedtime. 90 tablet 1   Multiple Vitamin (MULTIVITAMIN) tablet Take 1 tablet by mouth daily at 6 PM.     polyethylene glycol powder (GLYCOLAX/MIRALAX) 17 GM/SCOOP powder Take 17 g by mouth 2 (two) times daily as needed. (Patient taking differently: Take 17 g by mouth at bedtime.) 3350 g 1   PREVIDENT 5000 BOOSTER PLUS 1.1 % PSTE Place 1 application onto teeth 2 (two) times daily.     rivaroxaban (XARELTO) 20 MG TABS tablet Take 1 tablet (20 mg total) by mouth daily.  90 tablet 1   rOPINIRole (REQUIP) 3 MG tablet TAKE ONE TABLET BY MOUTH THREE TIMES A DAY 90 tablet 0   simvastatin (ZOCOR) 40 MG tablet Take 1 tablet (40 mg total) by mouth daily. 90 tablet 2   tamsulosin (FLOMAX) 0.4 MG CAPS capsule Take 1 capsule (0.4 mg total) by mouth daily. 90 capsule 1   traZODone (DESYREL) 50 MG tablet Take 1 tablet (50 mg total) by mouth at bedtime. 90 tablet 1   Insulin Pen Needle (PEN NEEDLES) 30G X 5 MM MISC 1 each by Does not apply route daily. (Patient not taking: Reported on 02/03/2022) 30 each 0   traMADol (ULTRAM) 50 MG tablet Take 50 mg by mouth at bedtime. (Patient not taking: Reported on 02/03/2022)     No current facility-administered medications for this visit.    Allergies:   Codeine and Other   Social  History:  The patient  reports that he has never smoked. He has never used smokeless tobacco. He reports that he does not drink alcohol and does not use drugs.   Family History:   family history includes Coronary artery disease in his mother; Deep vein thrombosis in his sister; Heart attack in his sister; Parkinson's disease in his father.    Review of Systems: Review of Systems  HENT: Negative.    Respiratory: Negative.    Cardiovascular: Negative.   Gastrointestinal: Negative.   Musculoskeletal:  Positive for back pain.  Neurological: Negative.   Psychiatric/Behavioral: Negative.    All other systems reviewed and are negative.   PHYSICAL EXAM: VS:  BP (!) 100/50 (BP Location: Left Arm, Patient Position: Sitting, Cuff Size: Normal)   Pulse (!) 54   Ht '5\' 9"'$  (1.753 m)   Wt 222 lb 2 oz (100.8 kg)   SpO2 96%   BMI 32.80 kg/m  , BMI Body mass index is 32.8 kg/m. Constitutional:  oriented to person, place, and time. No distress.  HENT:  Head: Grossly normal Eyes:  no discharge. No scleral icterus.  Neck: No JVD, no carotid bruits  Cardiovascular: Regular rate and rhythm, no murmurs appreciated Pulmonary/Chest: Clear to auscultation  bilaterally, no wheezes or rails Abdominal: Soft.  no distension.  no tenderness.  Musculoskeletal: Normal range of motion Neurological:  normal muscle tone. Coordination normal. No atrophy Skin: Skin warm and dry Psychiatric: normal affect, pleasant  Recent Labs: 07/18/2021: TSH 1.130 01/22/2022: ALT 5; BUN 17; Creatinine, Ser 1.10; Hemoglobin 14.2; Platelets 156; Potassium 4.4; Sodium 139    Lipid Panel Lab Results  Component Value Date   CHOL 120 01/22/2022   HDL 49 01/22/2022   LDLCALC 60 01/22/2022   TRIG 48 01/22/2022    Wt Readings from Last 3 Encounters:  02/03/22 222 lb 2 oz (100.8 kg)  01/22/22 223 lb 9.6 oz (101.4 kg)  09/16/21 226 lb (102.5 kg)     ASSESSMENT AND PLAN:  Paroxysmal atrial fibrillation (HCC) -  Maintaining normal sinus rhythm, tolerating metoprolol succinate 25 daily Continue Xarelto  PVCs Asymptomatic, not noted on EKG  Hypercholesterolemia Cholesterol is at goal on the current lipid regimen. No changes to the medications were made.  Essential hypertension Low blood pressure in the setting of 8 pound weight loss Recommend he hydrate, continue lisinopril and metoprolol For any orthostasis symptoms or continued low blood pressure may need to cut lisinopril in half  Back pain Prior back surgery Scheduled for MRI today then follow-up with neurosurgery  Deep vein thrombosis (DVT) of left lower extremity, unspecified chronicity, unspecified vein (HCC)  nonocclusive clot left popliteal vein  on chronic anticoagulation xarelto.  No recent events  Other chronic pulmonary embolism (HCC) On chronic anticoagulation, Xarelto 20 mg, full dose for A-fib prophylaxis  Parkinson's disease ([place]) Exercising on a regular basis, balance stable   Total encounter time more than 30 minutes  Greater than 50% was spent in counseling and coordination of care with the patient  Orders Placed This Encounter  Procedures   EKG 12-Lead    Signed, Esmond Plants, M.D., Ph.D. 02/03/2022  [place], [place]

## 2022-02-03 ENCOUNTER — Ambulatory Visit: Payer: Medicare HMO | Attending: Cardiovascular Disease | Admitting: Cardiovascular Disease

## 2022-02-03 ENCOUNTER — Ambulatory Visit
Admission: RE | Admit: 2022-02-03 | Discharge: 2022-02-03 | Disposition: A | Discharge status: Home / Self Care | Payer: Medicare HMO | Source: Ambulatory Visit | Attending: Cardiology | Admitting: Cardiology

## 2022-02-03 ENCOUNTER — Encounter: Payer: Self-pay | Admitting: Cardiovascular Disease

## 2022-02-03 VITALS — BP 100/50 | HR 54 | Ht 69.0 in | Wt 222.1 lb

## 2022-02-03 DIAGNOSIS — M4126 Other idiopathic scoliosis, lumbar region: Secondary | ICD-10-CM | POA: Insufficient documentation

## 2022-02-03 DIAGNOSIS — G20A1 Parkinson's disease without dyskinesia, without mention of fluctuations: Secondary | ICD-10-CM

## 2022-02-03 DIAGNOSIS — I10 Essential (primary) hypertension: Secondary | ICD-10-CM

## 2022-02-03 DIAGNOSIS — E7800 Pure hypercholesterolemia, unspecified: Secondary | ICD-10-CM

## 2022-02-03 DIAGNOSIS — I82502 Chronic embolism and thrombosis of unspecified deep veins of left lower extremity: Secondary | ICD-10-CM

## 2022-02-03 DIAGNOSIS — I480 Paroxysmal atrial fibrillation: Secondary | ICD-10-CM

## 2022-02-03 DIAGNOSIS — M48061 Spinal stenosis, lumbar region without neurogenic claudication: Secondary | ICD-10-CM | POA: Diagnosis not present

## 2022-02-03 DIAGNOSIS — M545 Low back pain, unspecified: Secondary | ICD-10-CM | POA: Diagnosis not present

## 2022-02-03 NOTE — Patient Instructions (Addendum)
Medication Instructions:  No changes  Call if you continue to experience dizziness when standing We might need to cut the lisinopril dose down  If you need a refill on your cardiac medications before your next appointment, please call your pharmacy.   Lab work: No new labs needed  Testing/Procedures: No new testing needed  Follow-Up: At [facility], you and your health needs are our priority.  As part of our continuing mission to provide you with exceptional heart care, we have created designated Provider Care Teams.  These Care Teams include your primary Cardiologist (physician) and Advanced Practice Providers (APPs -  Physician Assistants and Nurse Practitioners) who all work together to provide you with the care you need, when you need it.  You will need a follow up appointment in 12 months  Providers on your designated Care Team:   Murray Hodgkins, NP Christell Faith, PA-C Cadence Kathlen Mody, [place]  COVID-19 Vaccine Information can be found at: [email or web address].uk For questions related to vaccine distribution or appointments, please email [email or web address] or call [phone].

## 2022-02-17 DIAGNOSIS — M5459 Other low back pain: Secondary | ICD-10-CM | POA: Diagnosis not present

## 2022-02-17 DIAGNOSIS — R262 Difficulty in walking, not elsewhere classified: Secondary | ICD-10-CM | POA: Diagnosis not present

## 2022-02-20 ENCOUNTER — Telehealth: Payer: Self-pay | Admitting: Neurology

## 2022-02-20 NOTE — Telephone Encounter (Signed)
Pt's wife called in frustrated due to [facility] is not getting back with them about getting shots for his pain after they got his MRI results back. She wants to see if Dr. Carles Collet might could speed the process up since she referred the pt to Dr. Reatha Armour.

## 2022-02-21 DIAGNOSIS — R262 Difficulty in walking, not elsewhere classified: Secondary | ICD-10-CM | POA: Diagnosis not present

## 2022-02-21 DIAGNOSIS — M5459 Other low back pain: Secondary | ICD-10-CM | POA: Diagnosis not present

## 2022-02-21 NOTE — Telephone Encounter (Signed)
Called patients wife and informed her that Dr. Carles Collet is unable to assist with her issues at [facility]. Informed patients wife that she could call and ask to speak to their Environmental education officer and inform them that she has not received any calls back or help with patient and she is concerned about this. Patients wife verbalized understanding and thanked me for the call.

## 2022-02-24 DIAGNOSIS — R262 Difficulty in walking, not elsewhere classified: Secondary | ICD-10-CM | POA: Diagnosis not present

## 2022-02-24 DIAGNOSIS — M5459 Other low back pain: Secondary | ICD-10-CM | POA: Diagnosis not present

## 2022-02-28 DIAGNOSIS — M5459 Other low back pain: Secondary | ICD-10-CM | POA: Diagnosis not present

## 2022-02-28 DIAGNOSIS — R262 Difficulty in walking, not elsewhere classified: Secondary | ICD-10-CM | POA: Diagnosis not present

## 2022-03-03 ENCOUNTER — Telehealth: Payer: Self-pay | Admitting: Neurology

## 2022-03-03 ENCOUNTER — Other Ambulatory Visit: Payer: Self-pay | Admitting: Family Medicine

## 2022-03-03 ENCOUNTER — Other Ambulatory Visit: Payer: Self-pay

## 2022-03-03 DIAGNOSIS — M5459 Other low back pain: Secondary | ICD-10-CM | POA: Diagnosis not present

## 2022-03-03 DIAGNOSIS — R262 Difficulty in walking, not elsewhere classified: Secondary | ICD-10-CM | POA: Diagnosis not present

## 2022-03-03 MED ORDER — AMANTADINE HCL 100 MG PO CAPS
ORAL_CAPSULE | ORAL | 0 refills | Status: DC
Start: 2022-03-03 — End: 2022-06-02

## 2022-03-03 NOTE — Telephone Encounter (Signed)
Prescription sent patients wife called

## 2022-03-03 NOTE — Telephone Encounter (Signed)
Patients wife stated Thomas [name] needs a refill on his Amantadine '100mg'$  sent to [facility] in [place], not [place]. Their phone number is [phone]

## 2022-03-03 NOTE — Telephone Encounter (Signed)
Medication Refill - Medication: traZODone (DESYREL) 50 MG tablet   Has the patient contacted their pharmacy? No.  Preferred Pharmacy (with phone number or street name):  [name] [id number], [place] Phone: [phone]  Fax: [phone]     Has the patient been seen for an appointment in the last year OR does the patient have an upcoming appointment? Yes.    Agent: Please be advised that RX refills may take up to 3 business days. We ask that you follow-up with your pharmacy.

## 2022-03-04 DIAGNOSIS — M47816 Spondylosis without myelopathy or radiculopathy, lumbar region: Secondary | ICD-10-CM | POA: Diagnosis not present

## 2022-03-04 DIAGNOSIS — M5136 Other intervertebral disc degeneration, lumbar region: Secondary | ICD-10-CM | POA: Diagnosis not present

## 2022-03-04 DIAGNOSIS — M48062 Spinal stenosis, lumbar region with neurogenic claudication: Secondary | ICD-10-CM | POA: Diagnosis not present

## 2022-03-04 NOTE — Telephone Encounter (Signed)
Refilled 01/22/2022 #90 1 rf - same pharmacy. Requested Prescriptions  Pending Prescriptions Disp Refills   traZODone (DESYREL) 50 MG tablet 90 tablet 1    Sig: Take 1 tablet (50 mg total) by mouth at bedtime.     Psychiatry: Antidepressants - Serotonin Modulator Passed - 03/03/2022  1:33 PM      Passed - Completed PHQ-2 or PHQ-9 in the last 360 days      Passed - Valid encounter within last 6 months    Recent Outpatient Visits           1 month ago Need for influenza vaccination   [place], Megan P, DO   6 months ago Upper respiratory tract infection, unspecified type   [place], Megan P, DO   7 months ago Routine general medical examination at a health care facility   [place], [place], DO   1 year ago Inability to walk   [facility], [place], DO   1 year ago Primary hypertension   [facility] [name], DO       Future Appointments             In 2 months Ralene Bathe, MD [place]   In 4 months [name], Barb Merino, DO [facility]

## 2022-03-07 DIAGNOSIS — R262 Difficulty in walking, not elsewhere classified: Secondary | ICD-10-CM | POA: Diagnosis not present

## 2022-03-07 DIAGNOSIS — M5459 Other low back pain: Secondary | ICD-10-CM | POA: Diagnosis not present

## 2022-03-10 ENCOUNTER — Ambulatory Visit: Payer: Self-pay | Admitting: *Deleted

## 2022-03-10 DIAGNOSIS — M5459 Other low back pain: Secondary | ICD-10-CM | POA: Diagnosis not present

## 2022-03-10 DIAGNOSIS — R262 Difficulty in walking, not elsewhere classified: Secondary | ICD-10-CM | POA: Diagnosis not present

## 2022-03-10 NOTE — Telephone Encounter (Signed)
  Chief Complaint: Having jaw pain that started when he bit into a piece of chicken.   Wife, [name] wanted to know if his PCP or dentist would be best option for finding out what is wrong.   She thinks he could have TMJ, just a thought not sure. Symptoms: Left side of jaw hurts ever since biting into a piece of chicken. Frequency: Hurts even when he is not chewing Pertinent Negatives: Patient denies history of TMJ or a bad tooth on that side. Disposition: '[]'$ ED /'[]'$ Urgent Care (no appt availability in office) / '[]'$ Appointment(In office/virtual)/ '[]'$  [place] [facility]/ '[x]'$ Home Care/ '[]'$ Refused Recommended Disposition /'[]'$ [place] [facility]/ '[]'$  Follow-up with PCP Additional Notes: Wife [name] has decided to call his dentist first.

## 2022-03-10 NOTE — Telephone Encounter (Signed)
Message from Oneta Rack sent at 03/10/2022 11:57 AM EST  Summary: ? TMJ   Caller states patient is experiencing jaw pain, he bit down on a piece of chicken and his jaw started to hurt. Caller states she thinks its ?  (TMJ) temporomandibular joints and declined to schedule appointment at this time. Unsure if patient should call dentist or see PCP.          Call History   Type Contact Phone/Fax User  03/10/2022 11:54 AM EST Phone (Incoming) [name],Thomas (Emergency Contact) [phone] Oneta Rack   Reason for Disposition  [1] MILD-MODERATE mouth pain AND [2] present > 3 days    Recommended to Thomas [name] his wife to call his dentist that they have the proper x ray equipment necessary to make x rays of his teeth and the jaw.   Also the dentist can r/o a broken/chipped tooth.  Answer Assessment - Initial Assessment Questions 1. ONSET: "When did the mouth start hurting?" (e.g., hours or days ago)      Bit into some chicken and his jaw started to hurt.  Wife on line.   Didn't know if dentist should see him or PCP.   2. SEVERITY: "How bad is the pain?" (Scale 1-10; mild, moderate or severe)   - MILD (1-3):  doesn't interfere with eating or normal activities   - MODERATE (4-7): interferes with eating some solids and normal activities   - SEVERE (8-10):  excruciating pain, interferes with most normal activities   - SEVERE DYSPHAGIA: can't swallow liquids, drooling     Left side of jaw  Having pain even when not chewing since biting down on a piece of chicken.   She wondered if it was TMJ.   She wanted to know who would be better set up to do the needed x-rays and/or treatment needed for this type of pain.   I recommended his dentist since they are set up for taking x rays of the oral cavity and dental x rays which PCP does not have.  They can do a panoramic x ray of his whole jaw and teeth.   The dentist could also make sure he doesn't have a cracked tooth.   Wife, Thomas [name] was agreeable to this  plan and thanked me for my help. 3. SORES: "Are there any sores or ulcers in the mouth?" If Yes, ask: "What part of the mouth are the sores in?"     No   Just pain in his jaw since biting into some chicken. 4. FEVER: "Do you have a fever?" If Yes, ask: "What is your temperature, how was it measured, and when did it start?"     Not asked 5. CAUSE: "What do you think is causing the mouth pain?"     He bit into a piece of chicken and his jaw started to hurt and is still hurting all the time even when he is not chewing.   He has had so many therapies and all I just want to be able to go to one place and get this problem diagnosed and taken care of so I didn't know if it was the PCP or dentist.   She thanked me for my help.    6. OTHER SYMPTOMS: "Do you have any other symptoms?" (e.g., difficulty breathing)     No  Protocols used: Mouth Pain-A-AH

## 2022-03-18 ENCOUNTER — Other Ambulatory Visit: Payer: Self-pay | Admitting: Family Medicine

## 2022-03-18 DIAGNOSIS — M5459 Other low back pain: Secondary | ICD-10-CM | POA: Diagnosis not present

## 2022-03-18 DIAGNOSIS — R262 Difficulty in walking, not elsewhere classified: Secondary | ICD-10-CM | POA: Diagnosis not present

## 2022-03-18 NOTE — Telephone Encounter (Signed)
PT's wife brought in an envelope to give to provider regarding Xarelto refills.  I gave it directly to provider, she opened and seen what it was for told me to give it to clincal.  Put in provider's folder.

## 2022-03-25 MED ORDER — RIVAROXABAN 20 MG PO TABS: 20.0000 mg | ORAL_TABLET | Freq: Every day | ORAL | 1 refills | Status: DC

## 2022-03-25 NOTE — Telephone Encounter (Signed)
Spoke with patient wife to notify her Dr.Johnson has sent over prescription to requested pharmacy. Verbalized understanding.

## 2022-03-25 NOTE — Telephone Encounter (Signed)
The spouse called in making sure the prescription for rivaroxaban (XARELTO) 20 MG TABS tablet  is only called into [facility] as listed on the letter she brought in to give to the provider on 12/05.  This is the only script to be called in there. All of his other medicines will go to [facility]. Please assist patient further.

## 2022-03-25 NOTE — Telephone Encounter (Signed)
Please advise and call spouse back, she wants to make sure this is resolved today.   Best contact: [phone] (please call her cell)

## 2022-04-02 DIAGNOSIS — M5416 Radiculopathy, lumbar region: Secondary | ICD-10-CM | POA: Diagnosis not present

## 2022-04-02 DIAGNOSIS — M48062 Spinal stenosis, lumbar region with neurogenic claudication: Secondary | ICD-10-CM | POA: Diagnosis not present

## 2022-04-02 DIAGNOSIS — M47816 Spondylosis without myelopathy or radiculopathy, lumbar region: Secondary | ICD-10-CM | POA: Diagnosis not present

## 2022-04-02 NOTE — Progress Notes (Signed)
Assessment/Plan:    1.  Parkinsons Disease             -Continue ropinirole, 3 mg 3 times per day.  Discussed with him that we will watch closely memory, especially as we do neurocognitive testing.  We may have to decrease that in the future.  We had increased it, and they thought it helped his motor symptoms.             -Continue carbidopa/levodopa 25/100, 2 tablets at 6 AM/10 AM/2 PM/6 PM             -Continue carbidopa/levodopa 50/200 CR at bedtime  -He is following regularly with dermatology.   -pt doing well with inbrija.  Samples given and RX sent.     2.  Parkinson's dyskinesia             -Continue amantadine, 100 mg 3 times per day.    -Did discuss that amantadine can affect memory and we will need to watch that closely.  At this point in time, I am not convinced he has PDD.   3.  RBD             -Discussed safety.  Declines medication right now.   4.  MCI             -Neurocognitive test in August, 2020 without evidence of dementia.    -Repeat neurocognitive testing is scheduled for March, 2024    5.  Neurogenic Orthostatic Hypotension  -Doing well right now.             -likely due to BP meds in combination with Parkinsons Disease.    His PCP told him to only take amlodipine if his blood pressure is running greater than 160.  He is on hydralazine, lisinopril, metoprolol and tamsulosin which all are driving down blood pressure as well.    They do tell me they are watching it closely at home and he has been asymptomatic.   6.    Lumbar spinal stenosis and neural foraminal stenosis             -status post surgical intervention with microdiscectomy at L3-L4 and L4-L5 on October 11, 2020 with Dr. Vertell Limber.  He is currently following with PMR at the [facility] and receiving injections  7.  Sleep disturbance  -On trazodone, prescribed by primary care  -On melatonin, 5 mg nightly  Subjective:   Thomas [name] was seen today in follow up for Parkinsons disease.  My  previous records were reviewed prior to todays visit as well as outside records available to me.  Patient given Inbrija samples to try last visit and reports that its helping and they request a RX for it.  He uses it twice every week or every other week.  No falls.  He is exercising with rsb.  He got a shot wed for his back and he feels pretty good with it until evening time.    Current prescribed movement disorder medications:   Ropinirole, 3 mg 3 times per day Carbidopa/levodopa 25/100, 2 tablets at 6 AM/2 tablets at 10 AM/2 tablets at 2 PM/2 tablets at 6 PM Amantadine 100 mg 3 times per day carbidopa/levodopa 50/200 CR q hs Inbrija (started last visit)   ALLERGIES:   Allergies  Allergen Reactions   Codeine Other (See Comments)    Hallucinations   Other Nausea Only    General anesthesia     CURRENT MEDICATIONS:  Outpatient Encounter  Medications as of 04/04/2022  Medication Sig   acetaminophen (TYLENOL) 500 MG tablet Take 1,000 mg by mouth daily.   amantadine (SYMMETREL) 100 MG capsule TAKE 1 CAPSULE THREE TIMES DAILY   amLODipine (NORVASC) 2.5 MG tablet TAKE 1 TABLET (2.5 MG TOTAL) BY MOUTH DAILY AS NEEDED (BLOOD PRESSURE GREATER THAN 160/90). TAKE IN THE MORNING   calcium carbonate (TUMS - DOSED IN MG ELEMENTAL CALCIUM) 500 MG chewable tablet Chew 1 tablet by mouth daily as needed for indigestion or heartburn.   carbidopa-levodopa (SINEMET CR) 50-200 MG tablet TAKE ONE TABLET BY MOUTH AT BEDTIME   carbidopa-levodopa (SINEMET IR) 25-100 MG tablet TAKE 2 TABLETS FOUR TIMES DAILY - AT 6AM, 10AM, 2PM, AND 6PM   cetirizine (ZYRTEC) 10 MG tablet Take 10 mg by mouth at bedtime.   cholecalciferol (VITAMIN D3) 25 MCG (1000 UNIT) tablet Take 1,000 Units by mouth daily.   diclofenac Sodium (VOLTAREN) 1 % GEL APPLY TOPICALLY 4 GRAMS FOUR TIMES DAILY (Patient taking differently: Apply 4 g topically 4 (four) times daily as needed (pain).)   hydrALAZINE (APRESOLINE) 25 MG tablet Take 1 tablet  (25 mg total) by mouth 3 (three) times daily as needed (for SBP >160). Takes in the evening   HYDROcodone-acetaminophen (NORCO/VICODIN) 5-325 MG tablet Take by mouth.   Insulin Pen Needle (PEN NEEDLES) 30G X 5 MM MISC 1 each by Does not apply route daily.   Levodopa (INBRIJA) 42 MG CAPS Samples of this drug were given to the patient, quantity 1 box of 32 capsules, Lot Number [id number] Exp: 03/2023  Patient has been educated on dosage and uses of medication.   lisinopril (ZESTRIL) 30 MG tablet Take 1 tablet (30 mg total) by mouth daily.   melatonin 5 MG TABS Take 5 mg by mouth at bedtime.   methocarbamol (ROBAXIN) 500 MG tablet Take 1 tablet (500 mg total) by mouth every 6 (six) hours as needed for muscle spasms.   metoprolol succinate (TOPROL-XL) 25 MG 24 hr tablet Take 1 tablet (25 mg total) by mouth at bedtime.   Multiple Vitamin (MULTIVITAMIN) tablet Take 1 tablet by mouth daily at 6 PM.   polyethylene glycol powder (GLYCOLAX/MIRALAX) 17 GM/SCOOP powder Take 17 g by mouth 2 (two) times daily as needed. (Patient taking differently: Take 17 g by mouth at bedtime.)   PREVIDENT 5000 BOOSTER PLUS 1.1 % PSTE Place 1 application onto teeth 2 (two) times daily.   rivaroxaban (XARELTO) 20 MG TABS tablet Take 1 tablet (20 mg total) by mouth daily.   rOPINIRole (REQUIP) 3 MG tablet TAKE ONE TABLET BY MOUTH THREE TIMES A DAY   simvastatin (ZOCOR) 40 MG tablet Take 1 tablet (40 mg total) by mouth daily.   tamsulosin (FLOMAX) 0.4 MG CAPS capsule Take 1 capsule (0.4 mg total) by mouth daily.   traZODone (DESYREL) 50 MG tablet Take 1 tablet (50 mg total) by mouth at bedtime.   diazepam (VALIUM) 5 MG tablet Take 5 mg by mouth 2 (two) times daily as needed. (Patient not taking: Reported on 04/04/2022)   [DISCONTINUED] traMADol (ULTRAM) 50 MG tablet Take 50 mg by mouth at bedtime. (Patient not taking: Reported on 02/03/2022)   No facility-administered encounter medications on file as of 04/04/2022.     Objective:   PHYSICAL EXAMINATION:    VITALS:   Vitals:   04/04/22 1055  BP: 118/78  Pulse: 62  SpO2: 96%  Height: '5\' 9"'$  (1.753 m)       GEN:  The patient appears  stated age and is in NAD. HEENT:  Normocephalic, atraumatic.  The mucous membranes are moist. The superficial temporal arteries are without ropiness or tenderness. CV:  brady Lungs:  CTAB Neck/HEME:  There are no carotid bruits bilaterally.   Neurological examination:  Orientation: The patient is alert and oriented x3.  He is able to provide his own history and ask appropriate questions. Cranial nerves: There is good facial symmetry with facial hypomimia. The speech is fluent and clear. Soft palate rises symmetrically and there is no tongue deviation. Hearing is intact to conversational tone. Sensation: Sensation is intact to light touch throughout Motor: Strength is at least antigravity x4.  Movement examination: Tone: There is normal tone in the UE/LE Abnormal movements: he has some mild to mod axial dyskinesia Coordination:  There is good rapid alternating movements today. Gait and Station: The patient pushes off to arise.  The patient's stride length has slightly decreased stride length and decreased arm swing.  He is flexed at waist  I have reviewed and interpreted the following labs independently    Chemistry      Component Value Date/Time   NA 139 01/22/2022 1030   K 4.4 01/22/2022 1030   CL 102 01/22/2022 1030   CO2 22 01/22/2022 1030   BUN 17 01/22/2022 1030   CREATININE 1.10 01/22/2022 1030      Component Value Date/Time   CALCIUM 9.2 01/22/2022 1030   ALKPHOS 101 01/22/2022 1030   AST 9 01/22/2022 1030   ALT 5 01/22/2022 1030   BILITOT 0.6 01/22/2022 1030       Lab Results  Component Value Date   WBC 5.6 01/22/2022   HGB 14.2 01/22/2022   HCT 43.7 01/22/2022   MCV 91 01/22/2022   PLT 156 01/22/2022    Lab Results  Component Value Date   TSH 1.130 07/18/2021      Cc:   [name], DO

## 2022-04-04 ENCOUNTER — Encounter: Payer: Self-pay | Admitting: Neurology

## 2022-04-04 ENCOUNTER — Ambulatory Visit: Payer: Medicare HMO | Admitting: Neurology

## 2022-04-04 VITALS — BP 118/78 | HR 62 | Ht 69.0 in | Wt 228.0 lb

## 2022-04-04 DIAGNOSIS — G20A1 Parkinson's disease without dyskinesia, without mention of fluctuations: Secondary | ICD-10-CM

## 2022-04-04 DIAGNOSIS — G20B2 Parkinson's disease with dyskinesia, with fluctuations: Secondary | ICD-10-CM

## 2022-04-04 MED ORDER — INBRIJA 42 MG IN CAPS: ORAL_CAPSULE | RESPIRATORY_TRACT | 2 refills | Status: DC

## 2022-04-04 MED ORDER — INBRIJA 42 MG IN CAPS: ORAL_CAPSULE | RESPIRATORY_TRACT | 0 refills | Status: DC

## 2022-04-04 NOTE — Patient Instructions (Signed)
Local and Online Resources for Power over Parkinson's Group  December 2023    LOCAL [place] PARKINSON'S GROUPS   Power over Parkinson's Group:    Power Over Parkinson's Patient Education Group will be Wednesday, December 13th-*Hybrid meting*- in person at [facility] location and via WEBEX, 2:00-3:00 pm.   Starting in November, Power over Parkinson's and Care Partner Groups will meet together, with plans for separate break out session for caregivers (*this will be evolving over the next few months) Upcoming Power over Parkinson's Meetings/Care Partner Support:  2nd Wednesdays of the month at 2 pm:   December 13th, January 10th  Contact Amy Marriott at [email or web address] if interested in participating in this group    LOCAL EVENTS AND NEW OFFERINGS  Parkinson's Holiday Party!  Wednesday, December 6th, 4:00-5:00 pm.  [employer].  RSVP to Karen Simmers at [phone] or [email or web address] New PWR! Moves Community Fitness Instructor-Led Classes offering at [employer]!  TUESDAYS and Wednesdays 1-2 pm.   Contact Christy Weaver at  [email or web address]  or [phone] (Tuesday classes are modified for chair and standing only) Dance for Parkinson 's classes will be on Tuesdays 9:30am-10:30am starting October 3-December 12 with a break the week of November 21st. Located in the [facility], in the first floor of the [facility] ([address] To register:  [email or web address] or [phone]  Drumming for Parkinson's will be held on 2nd and 4th Mondays at 11:00 am.   Located at the [employer] ([address] [place]  Contact Jane Maydian at [email or web address] or [phone]  Through support from the Parkinson's Foundation, the Dance and Drumming for Parkinson's classes are free for both patients and caregivers.    [employer] Parkinson's Tai Chi Class, Mondays at  11 am.  Call [phone] for details   ONLINE EDUCATION AND SUPPORT  Parkinson Foundation:  www.parkinson.org  PD Health at Home continues:  Mindfulness Mondays, Wellness Wednesdays, Fitness Fridays   Upcoming Education:    Eating and Feeling Well through the Holidays. Wednesday, Dec. 6th,  1-2 pm  Hospital Safety.  Wednesday, Dec. 13th, 1-2 pm Register for expert briefings (webinars) at [email or web address]-support/[email or web address]/expert-[email or web address]-[email or web address]  Please check out their website to sign up for emails and see their full online offerings      [employer] Foundation:  www.michaeljfox.org   Third Thursday Webinars:  On the third Thursday of every month at 12 p.m. ET, join our free live webinars to learn about various aspects of living with Parkinson's disease and our work to speed medical breakthroughs.  Upcoming Webinar:  Tools for Diagnosing and Visualizing Parkinson's Disease.  Thursday, December 21st at 12 noon. Check out additional information on their website to see their full online offerings    Davis Phinney Foundation:  www.davisphinneyfoundation.org  Upcoming Webinar:   Stay tuned  Webinar Series:  Living with Parkinson's Meetup.   Third Thursdays each month, 3 pm  Care Partner Monthly Meetup.  With Connie Carpenter Phinney.  First Tuesday of each month, 2 pm  Check out additional information to Live Well Today on their website    Parkinson and Movement Disorders (PMD) Alliance:  www.pmdalliance.org  [employer]:  Online Education Events  Sign up for emails, which are sent weekly to give you updates on programming and online offerings    Parkinson's Association of the [place]:  www.parkinsonassociation.org  Information on online support groups, education events, and online exercises including Yoga, Parkinson's exercises and more-LOTS of information on   links to PD resources and online events  Virtual Support Group through Parkinson's Association  of the [place]; next one is scheduled for Wednesday, December 6th  at 2 pm.  (These are typically scheduled for the 1st Wednesday of the month at 2 pm).  Visit website for details.   MOVEMENT AND EXERCISE OPPORTUNITIES  PWR! Moves Classes at [place].  Wednesdays 10 and 11 am.   Contact Amy Marriott, PT [email or web address] if interested.  NEW PWR! Moves Class offerings at [employer].  *TUESDAYS* and Wednesdays 1-2 pm.    Contact Vonna Kotyk at  [email or web address]    Parkinson's Wellness Recovery (PWR! Moves)  www.[email or web address]  Info on the PWR! Virtual Experience:  You will have access to our expertise?through self-assessment, guided plans that start with the PD-specific fundamentals, educational content, tips, Q&A with an expert, and a growing Art therapist of PD-specific pre-recorded and live exercise classes of varying types and intensity - both physical and cognitive! If that is not enough, we offer 1:1 wellness consultations (in-person or virtual) to personalize your PWR! Research scientist (medical).   [place] Fridays:   As part of the PD Health @ Home program, this free video series focuses each week on one aspect of fitness designed to support people living with Parkinson's.? These weekly videos highlight the [place] fitness guidelines for people with Parkinson's disease.  ModemGamers.[email or web address]   Dance for PD website is offering free, live-stream classes throughout the week, as well as links to [employer] of classes:  [email or web address]  Virtual dance and Pilates for Parkinson's classes: Click on the Community Tab> Parkinson's Movement Initiative Tab.  To register for classes and for more information, visit [email or web address] and click the "community" tab.   [employer] Parkinson's Cycling Classes   [employer]:  Thursdays @ Noon-Live classes at [employer] ([employer] [facility] [email or web address]?or [phone])  [facility]: Virtual Classes Mondays and Thursdays Jeanette Caprice classes Tuesday, Wednesday and Thursday (contact [place] at [email or web address] ?or [phone])  [place]  Varied levels of classes are offered Tuesdays and Thursdays at [employer].   Stretching with Verdis Frederickson weekly class is also offered for people with Parkinson's  To observe a class or for more information, call [phone] or email Hezzie Bump at [email or web address]   ADDITIONAL SUPPORT AND RESOURCES  [employer]:Online Caregiver Education Opportunities:  www.well-springsolutions.org/caregiver-education/caregiver-support-group.  You may also contact Vickki Muff at [email or web address] or [phone].     Well[employer]Spring Navigator:  Just1Navigator program, a?free service to help individuals and families through the journey of determining care for older adults.  The "Navigator" is a Education officer, museum, Arnell Asal, who will speak with a prospective client and/or loved ones to provide an assessment of the situation and a set of recommendations for a personalized care plan -- all free of charge, and whether?[employer] offers the needed service or not. If the need is not a service we provide, we are well-connected with reputable programs in town that we can refer you to.  www.well-springsolutions.org or to speak with the Navigator, call [phone].

## 2022-04-09 ENCOUNTER — Encounter: Payer: Self-pay | Admitting: Neurology

## 2022-04-14 ENCOUNTER — Other Ambulatory Visit: Payer: Self-pay | Admitting: Neurology

## 2022-04-14 DIAGNOSIS — G20A1 Parkinson's disease without dyskinesia, without mention of fluctuations: Secondary | ICD-10-CM

## 2022-04-19 ENCOUNTER — Other Ambulatory Visit: Payer: Self-pay | Admitting: Neurology

## 2022-04-19 DIAGNOSIS — G20B1 Parkinson's disease with dyskinesia, without mention of fluctuations: Secondary | ICD-10-CM

## 2022-04-19 DIAGNOSIS — G20A1 Parkinson's disease without dyskinesia, without mention of fluctuations: Secondary | ICD-10-CM

## 2022-04-21 DIAGNOSIS — M47816 Spondylosis without myelopathy or radiculopathy, lumbar region: Secondary | ICD-10-CM | POA: Diagnosis not present

## 2022-04-21 DIAGNOSIS — M48062 Spinal stenosis, lumbar region with neurogenic claudication: Secondary | ICD-10-CM | POA: Diagnosis not present

## 2022-04-21 DIAGNOSIS — M5416 Radiculopathy, lumbar region: Secondary | ICD-10-CM | POA: Diagnosis not present

## 2022-04-21 DIAGNOSIS — Z79899 Other long term (current) drug therapy: Secondary | ICD-10-CM | POA: Diagnosis not present

## 2022-04-21 DIAGNOSIS — M5136 Other intervertebral disc degeneration, lumbar region: Secondary | ICD-10-CM | POA: Diagnosis not present

## 2022-04-30 ENCOUNTER — Telehealth: Payer: Self-pay | Admitting: Gastroenterology

## 2022-04-30 NOTE — Telephone Encounter (Signed)
Patients wife called and stated that the patient is due for his colonoscopy this year but he would like to put it off until 2025. Putting in a recall for patient for 2025 colonoscopy.

## 2022-05-13 ENCOUNTER — Ambulatory Visit: Payer: Medicare HMO | Admitting: Dermatology

## 2022-05-14 ENCOUNTER — Ambulatory Visit: Payer: Medicare HMO | Admitting: Dermatology

## 2022-05-14 VITALS — BP 172/89 | HR 55

## 2022-05-14 DIAGNOSIS — D229 Melanocytic nevi, unspecified: Secondary | ICD-10-CM

## 2022-05-14 DIAGNOSIS — Z1283 Encounter for screening for malignant neoplasm of skin: Secondary | ICD-10-CM

## 2022-05-14 DIAGNOSIS — L814 Other melanin hyperpigmentation: Secondary | ICD-10-CM | POA: Diagnosis not present

## 2022-05-14 DIAGNOSIS — I872 Venous insufficiency (chronic) (peripheral): Secondary | ICD-10-CM | POA: Diagnosis not present

## 2022-05-14 DIAGNOSIS — S51812A Laceration without foreign body of left forearm, initial encounter: Secondary | ICD-10-CM | POA: Diagnosis not present

## 2022-05-14 DIAGNOSIS — D692 Other nonthrombocytopenic purpura: Secondary | ICD-10-CM

## 2022-05-14 DIAGNOSIS — Z86018 Personal history of other benign neoplasm: Secondary | ICD-10-CM | POA: Diagnosis not present

## 2022-05-14 DIAGNOSIS — L821 Other seborrheic keratosis: Secondary | ICD-10-CM

## 2022-05-14 DIAGNOSIS — L578 Other skin changes due to chronic exposure to nonionizing radiation: Secondary | ICD-10-CM

## 2022-05-14 NOTE — Progress Notes (Signed)
Follow-Up Visit   Subjective  Thomas [name] is a 72 y.o. male who presents for the following: Annual Exam (Hx dysplastic nevus). The patient presents for Total-Body Skin Exam (TBSE) for skin cancer screening and mole check.  The patient has spots, moles and lesions to be evaluated, some may be new or changing and the patient has concerns that these could be cancer.  The following portions of the chart were reviewed this encounter and updated as appropriate:   Tobacco  Allergies  Meds  Problems  Med Hx  Surg Hx  Fam Hx     Review of Systems:  No other skin or systemic complaints except as noted in HPI or Assessment and Plan.  Objective  Well appearing patient in no apparent distress; mood and affect are within normal limits.  A full examination was performed including scalp, head, eyes, ears, nose, lips, neck, chest, axillae, abdomen, back, buttocks, bilateral upper extremities, bilateral lower extremities, hands, feet, fingers, toes, fingernails, and toenails. All findings within normal limits unless otherwise noted below.  L forearm Skin tear.   B/L leg Erythematous, scaly patches involving the ankle and distal lower leg with associated lower leg edema.    Assessment & Plan  Skin tear of forearm without complication, left, initial encounter L forearm Recommend cleaning with warm water and soap, patting dry, then applying a thin coat of antibiotic ointment and covering. Do this daily until healed.   Stasis dermatitis of both legs B/L leg With Schamberg's purpura -  Stasis in the legs causes chronic leg swelling, which may result in itchy or painful rashes, skin discoloration, skin texture changes, and sometimes ulceration.  Recommend daily graduated compression hose/stockings- easiest to put on first thing in morning, remove at bedtime.  Elevate legs as much as possible. Avoid salt/sodium rich foods.   Lentigines - Scattered tan macules - Due to sun exposure -  Benign-appearing, observe - Recommend daily broad spectrum sunscreen SPF 30+ to sun-exposed areas, reapply every 2 hours as needed. - Call for any changes  Seborrheic Keratoses - Stuck-on, waxy, tan-brown papules and/or plaques  - Benign-appearing - Discussed benign etiology and prognosis. - Observe - Call for any changes  Melanocytic Nevi - Tan-brown and/or pink-flesh-colored symmetric macules and papules - Benign appearing on exam today - Observation - Call clinic for new or changing moles - Recommend daily use of broad spectrum spf 30+ sunscreen to sun-exposed areas.   Hemangiomas - Red papules - Discussed benign nature - Observe - Call for any changes  Actinic Damage - Chronic condition, secondary to cumulative UV/sun exposure - diffuse scaly erythematous macules with underlying dyspigmentation - Recommend daily broad spectrum sunscreen SPF 30+ to sun-exposed areas, reapply every 2 hours as needed.  - Staying in the shade or wearing long sleeves, sun glasses (UVA+UVB protection) and wide brim hats (4-inch brim around the entire circumference of the hat) are also recommended for sun protection.  - Call for new or changing lesions.  History of Dysplastic Nevus - No evidence of recurrence today - Recommend regular full body skin exams - Recommend daily broad spectrum sunscreen SPF 30+ to sun-exposed areas, reapply every 2 hours as needed.  - Call if any new or changing lesions are noted between office visits  Purpura - Chronic; persistent and recurrent.  Treatable, but not curable. - Violaceous macules and patches - Benign - Related to trauma, age, sun damage and/or use of blood thinners, chronic use of topical and/or oral steroids - Observe -  Can use OTC arnica containing moisturizer such as Dermend Bruise Formula if desired - Call for worsening or other concerns  Skin cancer screening performed today.  Return in about 1 year (around 05/15/2023) for TBSE.  Luther Redo, CMA, am acting as scribe for Sarina Ser, MD . Documentation: I have reviewed the above documentation for accuracy and completeness, and I agree with the above.  Sarina Ser, MD

## 2022-05-14 NOTE — Patient Instructions (Signed)
Due to recent changes in healthcare laws, you may see results of your pathology and/or laboratory studies on MyChart before the doctors have had a chance to review them. We understand that in some cases there may be results that are confusing or concerning to you. Please understand that not all results are received at the same time and often the doctors may need to interpret multiple results in order to provide you with the best plan of care or course of treatment. Therefore, we ask that you please give us 2 business days to thoroughly review all your results before contacting the office for clarification. Should we see a critical lab result, you will be contacted sooner.   If You Need Anything After Your Visit  If you have any questions or concerns for your doctor, please call our main line at [phone] and press option 4 to reach your doctor's medical assistant. If no one answers, please leave a voicemail as directed and we will return your call as soon as possible. Messages left after 4 pm will be answered the following business day.   You may also send us a message via MyChart. We typically respond to MyChart messages within 1-2 business days.  For prescription refills, please ask your pharmacy to contact our office. Our fax number is [phone].  If you have an urgent issue when the clinic is closed that cannot wait until the next business day, you can page your doctor at the number below.    Please note that while we do our best to be available for urgent issues outside of office hours, we are not available 24/7.   If you have an urgent issue and are unable to reach us, you may choose to seek medical care at your doctor's office, retail clinic, urgent care center, or emergency room.  If you have a medical emergency, please immediately call 911 or go to the emergency department.  Pager Numbers  - Dr. Kowalski: [phone]  - Dr. Moye: [phone]  - Dr. Stewart:  [phone]  In the event of inclement weather, please call our main line at [phone] for an update on the status of any delays or closures.  Dermatology Medication Tips: Please keep the boxes that topical medications come in in order to help keep track of the instructions about where and how to use these. Pharmacies typically print the medication instructions only on the boxes and not directly on the medication tubes.   If your medication is too expensive, please contact our office at [phone] option 4 or send us a message through MyChart.   We are unable to tell what your co-pay for medications will be in advance as this is different depending on your insurance coverage. However, we may be able to find a substitute medication at lower cost or fill out paperwork to get insurance to cover a needed medication.   If a prior authorization is required to get your medication covered by your insurance company, please allow us 1-2 business days to complete this process.  Drug prices often vary depending on where the prescription is filled and some pharmacies may offer cheaper prices.  The website www.goodrx.com contains coupons for medications through different pharmacies. The prices here do not account for what the cost may be with help from insurance (it may be cheaper with your insurance), but the website can give you the price if you did not use any insurance.  - You can print the associated coupon and take it with   your prescription to the pharmacy.  - You may also stop by our office during regular business hours and pick up a GoodRx coupon card.  - If you need your prescription sent electronically to a different pharmacy, notify our office through [employer] MyChart or by phone at [phone] option 4.     Si Usted Necesita Algo Despus de Su Visita  Tambin puede enviarnos un mensaje a travs de MyChart. Por lo general respondemos a los mensajes de MyChart en el transcurso de 1 a 2  das hbiles.  Para renovar recetas, por favor pida a su farmacia que se ponga en contacto con nuestra oficina. Nuestro nmero de fax es el [phone].  Si tiene un asunto urgente cuando la clnica est cerrada y que no puede esperar hasta el siguiente da hbil, puede llamar/localizar a su doctor(a) al nmero que aparece a continuacin.   Por favor, tenga en cuenta que aunque hacemos todo lo posible para estar disponibles para asuntos urgentes fuera del horario de oficina, no estamos disponibles las 24 horas del da, los 7 das de la semana.   Si tiene un problema urgente y no puede comunicarse con nosotros, puede optar por buscar atencin mdica  en el consultorio de su doctor(a), en una clnica privada, en un centro de atencin urgente o en una sala de emergencias.  Si tiene una emergencia mdica, por favor llame inmediatamente al 911 o vaya a la sala de emergencias.  Nmeros de bper  - Dr. Kowalski: [phone]  - Dra. Moye: [phone]  - Dra. Stewart: [phone]  En caso de inclemencias del tiempo, por favor llame a nuestra lnea principal al [phone] para una actualizacin sobre el estado de cualquier retraso o cierre.  Consejos para la medicacin en dermatologa: Por favor, guarde las cajas en las que vienen los medicamentos de uso tpico para ayudarle a seguir las instrucciones sobre dnde y cmo usarlos. Las farmacias generalmente imprimen las instrucciones del medicamento slo en las cajas y no directamente en los tubos del medicamento.   Si su medicamento es muy caro, por favor, pngase en contacto con nuestra oficina llamando al [phone] y presione la opcin 4 o envenos un mensaje a travs de MyChart.   No podemos decirle cul ser su copago por los medicamentos por adelantado ya que esto es diferente dependiendo de la cobertura de su seguro. Sin embargo, es posible que podamos encontrar un medicamento sustituto a menor costo o llenar un formulario para que el  seguro cubra el medicamento que se considera necesario.   Si se requiere una autorizacin previa para que su compaa de seguros cubra su medicamento, por favor permtanos de 1 a 2 das hbiles para completar este proceso.  Los precios de los medicamentos varan con frecuencia dependiendo del lugar de dnde se surte la receta y alguna farmacias pueden ofrecer precios ms baratos.  El sitio web www.goodrx.com tiene cupones para medicamentos de diferentes farmacias. Los precios aqu no tienen en cuenta lo que podra costar con la ayuda del seguro (puede ser ms barato con su seguro), pero el sitio web puede darle el precio si no utiliz ningn seguro.  - Puede imprimir el cupn correspondiente y llevarlo con su receta a la farmacia.  - Tambin puede pasar por nuestra oficina durante el horario de atencin regular y recoger una tarjeta de cupones de GoodRx.  - Si necesita que su receta se enve electrnicamente a una farmacia diferente, informe a nuestra oficina a travs de MyChart de [place]   o por telfono llamando al [phone] y presione la opcin 4.  

## 2022-05-17 ENCOUNTER — Encounter: Payer: Self-pay | Admitting: Dermatology

## 2022-06-01 ENCOUNTER — Other Ambulatory Visit: Payer: Self-pay | Admitting: Neurology

## 2022-06-02 DIAGNOSIS — H26493 Other secondary cataract, bilateral: Secondary | ICD-10-CM | POA: Diagnosis not present

## 2022-06-02 DIAGNOSIS — H35372 Puckering of macula, left eye: Secondary | ICD-10-CM | POA: Diagnosis not present

## 2022-06-02 DIAGNOSIS — H43813 Vitreous degeneration, bilateral: Secondary | ICD-10-CM | POA: Diagnosis not present

## 2022-06-02 DIAGNOSIS — Z0100 Encounter for examination of eyes and vision without abnormal findings: Secondary | ICD-10-CM | POA: Diagnosis not present

## 2022-06-06 ENCOUNTER — Telehealth: Payer: Self-pay | Admitting: Neurology

## 2022-06-06 NOTE — Telephone Encounter (Signed)
Pt's wife called in and left a message. She is wanting an update on getting the Inbrija?

## 2022-06-09 NOTE — Telephone Encounter (Signed)
Patient's wife is calling in, states the medication is running low and due to it needing it be mailed out they are running out of time.

## 2022-06-10 NOTE — Telephone Encounter (Signed)
Called [employer] at [phone] and left a message for a call back.

## 2022-06-10 NOTE — Telephone Encounter (Signed)
Thomas [name] called back and informed me that he is the rep for [place] and not [place].

## 2022-06-10 NOTE — Telephone Encounter (Signed)
Received paperwork from [facility]. Needing dx code, asking if patient takes Carbidopa Levodopa, taken MAOII in the last two weeks, and if patient has any lung disease. Will fill out and fax back for patients inbrija.   Information has been filled out and fax has been sent for clarification.

## 2022-06-16 ENCOUNTER — Encounter: Payer: Self-pay | Admitting: Psychology

## 2022-06-16 DIAGNOSIS — M5430 Sciatica, unspecified side: Secondary | ICD-10-CM | POA: Insufficient documentation

## 2022-06-16 DIAGNOSIS — G459 Transient cerebral ischemic attack, unspecified: Secondary | ICD-10-CM | POA: Insufficient documentation

## 2022-06-16 DIAGNOSIS — M1990 Unspecified osteoarthritis, unspecified site: Secondary | ICD-10-CM | POA: Insufficient documentation

## 2022-06-17 ENCOUNTER — Encounter: Payer: Self-pay | Admitting: Psychology

## 2022-06-17 ENCOUNTER — Ambulatory Visit: Payer: Medicare HMO | Admitting: Psychology

## 2022-06-17 DIAGNOSIS — G20A1 Parkinson's disease without dyskinesia, without mention of fluctuations: Secondary | ICD-10-CM

## 2022-06-17 DIAGNOSIS — R4189 Other symptoms and signs involving cognitive functions and awareness: Secondary | ICD-10-CM

## 2022-06-17 DIAGNOSIS — F0670 Mild neurocognitive disorder due to known physiological condition without behavioral disturbance: Secondary | ICD-10-CM

## 2022-06-17 DIAGNOSIS — M47816 Spondylosis without myelopathy or radiculopathy, lumbar region: Secondary | ICD-10-CM | POA: Insufficient documentation

## 2022-06-17 NOTE — Progress Notes (Signed)
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 attention was average. More complex attention (e.g., working memory) was well below average. Executive functioning was generally exceptionally low. He did perform in the average range across a task assessing problem solving and reasoning abilities.  While not directly assessed, receptive language abilities were believed to be intact. Likewise, Mr. Frieders did not exhibit any difficulties comprehending task instructions and answered all questions asked of him appropriately during interview. Assessed expressive language (e.g., verbal fluency and confrontation naming) was average to above average.     Assessed visuospatial/visuoconstructional abilities were variable, ranging from the exceptionally low to average normative ranges. Across his drawing of a clock he included the number 3 twice, exhibited fairly notable spatial abnormalities in numerical placement, and incorrectly placed the clock hands. Points were lost on his copy of a complex figure generally due to mild visual distortions, as well as one internal aspect being omitted entirely.    Learning (i.e., encoding) of novel verbal and visual  information was exceptionally low to below average. Spontaneous delayed recall (i.e., retrieval) of previously learned information was exceptionally low. Retention rates were 8% across a story learning task, 20% (spontaneous) to 60% (cued) across a list learning task, and 67% across a shape learning task. Performance across recognition tasks was below expectation, suggesting limited evidence for information consolidation.   Results of emotional screening instruments suggested that recent symptoms of generalized anxiety were in the mild range, while symptoms of depression were within normal limits. A screening instrument assessing recent sleep quality suggested the presence of minimal sleep dysfunction.  Tables of Scores:   Note: This summary of test scores accompanies the interpretive report and should not be considered in isolation without reference to the appropriate sections in the text. Descriptors are based on appropriate normative data and may be adjusted based on clinical judgment. Terms such as "Within Normal Limits" and "Outside Normal Limits" are used when a more specific description of the test score cannot be determined. Descriptors refer to the current evaluation only.         Percentile - Normative Descriptor > 98 - Exceptionally High 91-97 - Well Above Average 75-90 - Above Average 25-74 - Average 9-24 - Below Average  2-8 - Well Below Average < 2 - Exceptionally Low         August 2020 Current    Orientation:       Raw Score Raw Score Percentile   NAB Orientation, Form 1 24/29 25/29 --- ---        Cognitive Screening:       Raw Score Raw Score Percentile   SLUMS: --- 18/30 --- ---        Intellectual Functioning:       Standard Score Standard Score Percentile   Test of Premorbid Functioning: 85 88 21 Below Average        Memory:      Wechsler Memory Scale (WMS-IV):                       Raw Score (Scaled Score) Raw Score (Scaled Score) Percentile     Logical Memory I  21/53 (6) 20/53 (6) 9 Below Average    Logical Memory II 15/39 (8) 1/39 (2) <1 Exceptionally Low    Logical Memory Recognition 16/23 15/23 10-16 Below Average        California Verbal Learning Test (CVLT-III) Brief Form: Raw Score (Scaled/Standard Score) Raw Score (Scaled/Standard Score) Percentile     Total Trials 1-4 21/36 (81) 16/36 (63) 1 Exceptionally Low    Short-Delay Free Recall 4/9 (3) 2/9 (1) <1 Exceptionally Low    Long-Delay Free Recall 4/9 (6) 1/9 (2) <1 Exceptionally Low    Long-Delay Cued Recall 4/9 (4) 3/9 (3) 1 Exceptionally Low      Recognition Hits 8/9 (9) 7/9 (7) 16 Below Average      False Positive Errors 2 (7) 6 (1) <1 Exceptionally Low        Brief Visuospatial Memory Test (BVMT-R), Form 1: Raw Score (T Score) Raw Score (T Score) Percentile     Total Trials 1-3 16/36 (41) 7/36 (26) 1 Exceptionally Low    Delayed Recall 5/12 (37) 2/12 (25) 1 Exceptionally Low    Recognition Discrimination Index 3 3 --- Well Below Average      Recognition Hits 3/6 3/6 --- Well Below Average      False Positive Errors 0 0 --- Within Normal Limits         Attention/Executive Function:      Trail Making Test (TMT): Raw Score (T Score) Raw Score (T Score) Percentile     Part A 67 secs.,  0 errors (29) 61 secs.,  0 errors (35) 7 Well Below Average    Part B 193 secs.,  3 errors (32) Discontinued --- Impaired          Scaled Score Scaled Score Percentile   WAIS-IV Coding: 6 7 16  Below Average        NAB Attention Module, Form 1: T Score T Score Percentile     Digits Forward 56 53 62 Average    Digits Backwards 36 30 2 Well Below Average        D-KEFS Color-Word Interference Test: Raw Score (Scaled Score) Raw Score (Scaled Score) Percentile     Color Naming 44 secs. (5) 46 secs. (5) 5 Well Below Average    Word Reading 30 secs. (7) 31 secs. (7) 16 Below Average    Inhibition 96 secs. (5) 75 secs. (10) 50 Average      Total Errors 1 error (11) 24 errors (1) <1  Exceptionally Low    Inhibition/Switching 132 secs. (2) Discontinued --- Impaired  Total Errors 10 errors (3) --- --- ---        D-KEFS 20 Questions Test: Scaled Score Scaled Score Percentile     Total Weighted Achievement Score 6 11 63 Average    Initial Abstraction Score 10 10 50 Average        Language:      Verbal Fluency Test: Raw Score (T Score) Raw Score (T Score) Percentile     Phonemic Fluency (FAS) 28 (39) 36 (47) 38 Average    Animal Fluency 16 (44) 15 (45) 31 Average         NAB Language Module, Form 1: T Score T Score Percentile     Naming 56 30/31 (57) 75 Above Average        Visuospatial/Visuoconstruction:       Raw Score Raw Score Percentile   Clock Drawing: 8/10 5/10 --- Impaired        NAB Spatial Module, Form 1: T Score T Score Percentile     Figure Drawing Copy 58 37 9 Below Average         Scaled Score Scaled Score Percentile   WAIS-IV Block Design: 10 11 63 Average  WAIS-IV Matrix Reasoning: 9 8 25  Average        Mood and Personality:       Raw Score Raw Score Percentile   Geriatric Depression Scale: 5 7 --- Within Normal Limits  Geriatric Anxiety Scale: 0 14 --- Mild    Somatic 0 7 --- Mild    Cognitive 0 3 --- Mild    Affective 0 4 --- Mild        Additional Questionnaires:       Raw Score Raw Score Percentile   PROMIS Sleep Disturbance Questionnaire: --- 21 --- None to Slight   Informed Consent and Coding/Compliance:   The current evaluation represents a clinical evaluation for the purposes previously outlined by the referral source and is in no way reflective of a forensic evaluation.   Mr. Potthoff was provided with a verbal description of the nature and purpose of the present neuropsychological evaluation. Also reviewed were the foreseeable risks and/or discomforts and benefits of the procedure, limits of confidentiality, and mandatory reporting requirements of this provider. The patient was given the opportunity to ask questions and receive  answers about the evaluation. Oral consent to participate was provided by the patient.   This evaluation was conducted by Newman Nickels, Ph.D., ABPP-CN, board certified clinical neuropsychologist. Mr. Quebedeaux completed a clinical interview with Dr. Milbert Coulter, billed as one unit [phone], and 115 minutes of cognitive testing and scoring, billed as one unit [phone] and three additional units [id number]. Psychometrist Wallace Keller, B.S., assisted Dr. Milbert Coulter with test administration and scoring procedures. As a separate and discrete service, Dr. Milbert Coulter spent a total of 160 minutes in interpretation and report writing billed as one unit [phone] and two units 96133.

## 2022-06-17 NOTE — Progress Notes (Signed)
   Psychometrician Note   Cognitive testing was administered to Thomas [name] by Milana Kidney, B.S. (psychometrist) under the supervision of Dr. Christia Reading, Ph.D., licensed psychologist on 06/17/2022. Thomas [name] did not appear overtly distressed by the testing session per behavioral observation or responses across self-report questionnaires. Rest breaks were offered.    The battery of tests administered was selected by Dr. Christia Reading, Ph.D. with consideration to Thomas [name] current level of functioning, the nature of his symptoms, emotional and behavioral responses during interview, level of literacy, observed level of motivation/effort, and the nature of the referral question. This battery was communicated to the psychometrist. Communication between Dr. Christia Reading, Ph.D. and the psychometrist was ongoing throughout the evaluation and Dr. Christia Reading, Ph.D. was immediately accessible at all times. Dr. Christia Reading, Ph.D. provided supervision to the psychometrist on the date of this service to the extent necessary to assure the quality of all services provided.    Thomas [name] will return within approximately 1-2 weeks for an interactive feedback session with Dr. Melvyn Novas at which time his test performances, clinical impressions, and treatment recommendations will be reviewed in detail. Thomas [name] understands he can contact our office should he require our assistance before this time.  A total of 115 minutes of billable time were spent face-to-face with Thomas [name] by the psychometrist. This includes both test administration and scoring time. Billing for these services is reflected in the clinical report generated by Dr. Christia Reading, Ph.D.  This note reflects time spent with the psychometrician and does not include test scores or any clinical interpretations made by Dr. Melvyn Novas. The full report will follow in a separate note.

## 2022-06-20 ENCOUNTER — Telehealth: Payer: Self-pay | Admitting: Family Medicine

## 2022-06-20 NOTE — Telephone Encounter (Signed)
Contacted [name] to schedule their annual wellness visit. Appointment made for 07/21/2022.  Sherol Dade; Care Guide [facility] Direct Dial: [phone]

## 2022-06-24 ENCOUNTER — Ambulatory Visit: Payer: Medicare HMO | Admitting: Psychology

## 2022-06-24 DIAGNOSIS — G20A1 Parkinson's disease without dyskinesia, without mention of fluctuations: Secondary | ICD-10-CM

## 2022-06-24 DIAGNOSIS — F0670 Mild neurocognitive disorder due to known physiological condition without behavioral disturbance: Secondary | ICD-10-CM | POA: Diagnosis not present

## 2022-06-24 NOTE — Progress Notes (Signed)
   Neuropsychology Feedback Session [name]. [employer] [facility] Neurology  Reason for Referral:   Thomas [name] is a 72 y.o. right-handed Caucasian male referred by Alonza Bogus, D.O., to characterize his current cognitive functioning and assist with diagnostic clarity and treatment planning in the context of Parkinson's disease and a prior mild neurocognitive disorder diagnosis.   Feedback:   [name] completed a comprehensive neuropsychological evaluation on 06/17/2022. Please refer to that encounter for the full report and recommendations. Briefly, results suggested primary impairments surrounding processing speed, complex attention, executive functioning (namely cognitive flexibility and response inhibition), and both encoding (i.e., learning) and retrieval aspects of memory. He also exhibited poor performance when attempting to draw a clock, as well as some variability across recognition aspects of memory. Relative to his previous evaluation in August 2020, cognitive decline was exhibited. This was most prominent across executive functioning and essentially all aspects of learning and memory. Decline was also exhibited across several visuo-constructional tasks (i.e., clock drawing, figure copy); however, other visuospatial performances were stable. Regarding etiology, his pattern of impairment continues to be very consistent with expectation given his history of Parkinson's disease. This continues to represent the most likely cause for ongoing dysfunction and mildly progressive decline. Other factors that could exacerbate impairment due to Parkinson's disease would include chronic pain, sleep dysfunction, and mild psychiatric distress or other psychosocial stressors. I do not see compelling evidence for Lewy body disease or another more rare parkinsonian presentation presently.    [name] was accompanied by his wife during the current feedback session. Content of the  current session focused on the results of his neuropsychological evaluation. [name] was given the opportunity to ask questions and his questions were answered. He was encouraged to reach out should additional questions arise. A copy of his report was provided at the conclusion of the visit.      Greater than 31 minutes were spent preparing for, conducting, and documenting the current feedback session with [name], billed as one unit [phone].

## 2022-06-26 ENCOUNTER — Telehealth: Payer: Self-pay | Admitting: Family Medicine

## 2022-06-26 DIAGNOSIS — R32 Unspecified urinary incontinence: Secondary | ICD-10-CM

## 2022-06-26 NOTE — Telephone Encounter (Signed)
Copied from [place] [id number]. Topic: Referral - Request for Referral >> Jun 26, 2022  9:18 AM Ludger Nutting wrote: Has patient seen PCP for this complaint? Yes.   *If NO, is insurance requiring patient see PCP for this issue before PCP can refer them? Referral for which specialty: Urology Preferred provider/office:  Reason for referral: Bladder Control Issue

## 2022-07-04 DIAGNOSIS — M5416 Radiculopathy, lumbar region: Secondary | ICD-10-CM | POA: Diagnosis not present

## 2022-07-04 DIAGNOSIS — M48062 Spinal stenosis, lumbar region with neurogenic claudication: Secondary | ICD-10-CM | POA: Diagnosis not present

## 2022-07-10 ENCOUNTER — Other Ambulatory Visit: Payer: Self-pay | Admitting: Neurology

## 2022-07-10 DIAGNOSIS — G20A1 Parkinson's disease without dyskinesia, without mention of fluctuations: Secondary | ICD-10-CM

## 2022-07-19 ENCOUNTER — Other Ambulatory Visit: Payer: Self-pay | Admitting: Neurology

## 2022-07-19 DIAGNOSIS — G20A1 Parkinson's disease without dyskinesia, without mention of fluctuations: Secondary | ICD-10-CM

## 2022-07-19 DIAGNOSIS — G20B1 Parkinson's disease with dyskinesia, without mention of fluctuations: Secondary | ICD-10-CM

## 2022-07-21 ENCOUNTER — Ambulatory Visit (INDEPENDENT_AMBULATORY_CARE_PROVIDER_SITE_OTHER): Payer: Medicare HMO

## 2022-07-21 VITALS — Ht 69.0 in | Wt 228.0 lb

## 2022-07-21 DIAGNOSIS — Z0000 Encounter for general adult medical examination without abnormal findings: Secondary | ICD-10-CM | POA: Diagnosis not present

## 2022-07-21 NOTE — Progress Notes (Signed)
I connected with  [name] on 07/21/22 by a audio enabled telemedicine application and verified that I am speaking with the correct person using two identifiers.  Patient Location: Home  Provider Location: Office/Clinic  I discussed the limitations of evaluation and management by telemedicine. The patient expressed understanding and agreed to proceed.  Subjective:   Matt Delpizzo is a 72 y.o. male who presents for Medicare Annual/Subsequent preventive examination.  Review of Systems     Cardiac Risk Factors include: advanced age (>48men, >25 women);hypertension;male gender;obesity (BMI >30kg/m2)     Objective:    Today's Vitals   07/21/22 1102  PainSc: 4    There is no height or weight on file to calculate BMI.     07/21/2022   11:06 AM 04/04/2022   11:03 AM 09/16/2021    2:06 PM 07/15/2021   11:30 AM 04/02/2021    8:44 AM 10/08/2020   11:11 AM 07/13/2020    1:02 PM  Advanced Directives  Does Patient Have a Medical Advance Directive? Yes Yes Yes Yes Yes Yes Yes  Type of Estate agent of [place];Living will Living will Healthcare Power of [place];Living will;Out of facility DNR (pink MOST or yellow form) Healthcare Power of [place];Living will Living will Healthcare Power of [place];Living will Healthcare Power of [place];Living will  Does patient want to make changes to medical advance directive? No - Patient declined        Copy of Healthcare Power of Attorney in Chart? Yes - validated most recent copy scanned in chart (See row information)   Yes - validated most recent copy scanned in chart (See row information)   Yes - validated most recent copy scanned in chart (See row information)    Current Medications (verified) Outpatient Encounter Medications as of 07/21/2022  Medication Sig   acetaminophen (TYLENOL) 500 MG tablet Take 1,000 mg by mouth daily.   amantadine (SYMMETREL) 100 MG capsule TAKE ONE CAPSULE BY MOUTH THREE TIMES A DAY   amLODipine  (NORVASC) 2.5 MG tablet TAKE 1 TABLET (2.5 MG TOTAL) BY MOUTH DAILY AS NEEDED (BLOOD PRESSURE GREATER THAN 160/90). TAKE IN THE MORNING   calcium carbonate (TUMS - DOSED IN MG ELEMENTAL CALCIUM) 500 MG chewable tablet Chew 1 tablet by mouth daily as needed for indigestion or heartburn.   carbidopa-levodopa (SINEMET CR) 50-200 MG tablet TAKE 1 TABLET BY MOUTH AT BEDTIME   carbidopa-levodopa (SINEMET IR) 25-100 MG tablet TAKE TWO TABLETS BY MOUTH FOUR TIMES A DAY AT 6AM, 10AM, 2:00PM AND 6PM   cetirizine (ZYRTEC) 10 MG tablet Take 10 mg by mouth at bedtime.   cholecalciferol (VITAMIN D3) 25 MCG (1000 UNIT) tablet Take 1,000 Units by mouth daily.   diazepam (VALIUM) 5 MG tablet Take 5 mg by mouth 2 (two) times daily as needed.   diclofenac Sodium (VOLTAREN) 1 % GEL APPLY TOPICALLY 4 GRAMS FOUR TIMES DAILY (Patient taking differently: Apply 4 g topically 4 (four) times daily as needed (pain).)   hydrALAZINE (APRESOLINE) 25 MG tablet Take 1 tablet (25 mg total) by mouth 3 (three) times daily as needed (for SBP >160). Takes in the evening   HYDROcodone-acetaminophen (NORCO/VICODIN) 5-325 MG tablet Take by mouth.   Insulin Pen Needle (PEN NEEDLES) 30G X 5 MM MISC 1 each by Does not apply route daily.   Levodopa (INBRIJA) 42 MG CAPS Samples of this drug were given to the patient, quantity 1 box of 32 capsules, Lot Number [id number] Exp: 03/2023  Patient has been educated on  dosage and uses of medication.   Levodopa (INBRIJA) 42 MG CAPS We are going to start inbrija.  Remember that TWO capsules is ONE dosage (never inhale just one capsule).  You can inhale the capsules as needed up to 5 times per day, separated by 2 hour intervals.  Many patients use this right when the wake up to help with first morning "on" and then as needed during the day.  You should take a sip of water prior to using the inhaler to avoid side effects.  It may generate some cough right when you use it and that is normal.  There are nurse  educators available to help you with this device.  You can call [phone] and they will set you up with a nurse educator to assist you for free of charge.  They are available 8am-8pm Monday-Friday.   Levodopa (INBRIJA) 42 MG CAPS Samples of this drug were given to the patient, quantity 1, Lot Number [id number] Exp 12-24   lisinopril (ZESTRIL) 30 MG tablet Take 1 tablet (30 mg total) by mouth daily.   melatonin 5 MG TABS Take 5 mg by mouth at bedtime.   methocarbamol (ROBAXIN) 500 MG tablet Take 1 tablet (500 mg total) by mouth every 6 (six) hours as needed for muscle spasms.   metoprolol succinate (TOPROL-XL) 25 MG 24 hr tablet Take 1 tablet (25 mg total) by mouth at bedtime.   Multiple Vitamin (MULTIVITAMIN) tablet Take 1 tablet by mouth daily at 6 PM.   polyethylene glycol powder (GLYCOLAX/MIRALAX) 17 GM/SCOOP powder Take 17 g by mouth 2 (two) times daily as needed. (Patient taking differently: Take 17 g by mouth at bedtime.)   PREVIDENT 5000 BOOSTER PLUS 1.1 % PSTE Place 1 application onto teeth 2 (two) times daily.   rivaroxaban (XARELTO) 20 MG TABS tablet Take 1 tablet (20 mg total) by mouth daily.   rOPINIRole (REQUIP) 3 MG tablet TAKE ONE TABLET BY MOUTH THREE TIMES A DAY   simvastatin (ZOCOR) 40 MG tablet Take 1 tablet (40 mg total) by mouth daily.   tamsulosin (FLOMAX) 0.4 MG CAPS capsule Take 1 capsule (0.4 mg total) by mouth daily.   traZODone (DESYREL) 50 MG tablet Take 1 tablet (50 mg total) by mouth at bedtime.   No facility-administered encounter medications on file as of 07/21/2022.    Allergies (verified) Codeine and Other   History: Past Medical History:  Diagnosis Date   Activated protein C resistance 06/28/2019   Arthritis of knee    Arthropathy of lumbar facet joint    Atrial fibrillation    BPH (benign prostatic hyperplasia) 09/10/2018   DVT (deep venous thrombosis)    L leg   Dysplastic nevus 05/15/2021   Left Superior Pectoral, mod to severe, Excised  07/16/21   Erectile dysfunction 03/01/2015   Herniated lumbar disc without myelopathy 10/11/2020   Hyperchloremia    Hypercholesterolemia    Hypertension    Inability to walk 02/22/2021   Insomnia due to medical condition 09/30/2018   Major depressive disorder 01/22/2022   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   Nephrolithiasis    Osteoarthritis    Parkinson's disease    Plantar wart    Pneumonia    Post-operative nausea and vomiting 09/20/2020   Pulmonary embolism    Sciatica    Seasonal allergies    TIA (transient ischemic attack)    Past Surgical History:  Procedure Laterality Date   APPENDECTOMY     CATARACT EXTRACTION Bilateral  10/2019 11/2019   COLONOSCOPY WITH PROPOFOL N/A 07/27/2019   Procedure: COLONOSCOPY WITH PROPOFOL;  Surgeon: Toney Reil, MD;  Location: [facility] ENDOSCOPY;  Service: Endoscopy;  Laterality: N/A;   EYE SURGERY     KNEE ARTHROSCOPY WITH MENISCAL REPAIR Right 2009   KNEE SURGERY Right    LUMBAR LAMINECTOMY/DECOMPRESSION MICRODISCECTOMY Left 10/11/2020   Procedure: Left Lumbar Three-Four, Lumbar Fou-Five  Microdiscectomy;  Surgeon: Maeola Harman, MD;  Location: [facility];  Service: Neurosurgery;  Laterality: Left;   melanoma removal Left 07/16/2021   chest   T&A     TONSILLECTOMY     Family History  Problem Relation Age of Onset   Parkinson's disease Father    Coronary artery disease Mother    Heart attack Sister    Deep vein thrombosis Sister    Social History   Socioeconomic History   Marital status: Married    Spouse name: Wilkie Aye   Number of children: 0   Years of education: 14   Highest education level: Some college, no degree  Occupational History   Occupation: Retired    Comment: Therapist, sports  Tobacco Use   Smoking status: Never   Smokeless tobacco: Never  Vaping Use   Vaping Use: Never used  Substance and Sexual Activity   Alcohol use: No    Alcohol/week: 0.0 standard drinks of alcohol   Drug use:  No   Sexual activity: Not Currently  Other Topics Concern   Not on file  Social History Narrative   ** Merged History Encounter **    right handed   Two story home    Live with spouse   Social Determinants of Health   Financial Resource Strain: Low Risk  (07/21/2022)   Overall Financial Resource Strain (CARDIA)    Difficulty of Paying Living Expenses: Not hard at all  Food Insecurity: No Food Insecurity (07/21/2022)   Hunger Vital Sign    Worried About Running Out of Food in the Last Year: Never true    Ran Out of Food in the Last Year: Never true  Transportation Needs: No Transportation Needs (07/21/2022)   PRAPARE - Administrator, Civil Service (Medical): No    Lack of Transportation (Non-Medical): No  Physical Activity: Sufficiently Active (07/21/2022)   Exercise Vital Sign    Days of Exercise per Week: 3 days    Minutes of Exercise per Session: 60 min  Stress: No Stress Concern Present (07/21/2022)   [employer] of Occupational Health - Occupational Stress Questionnaire    Feeling of Stress : Only a little  Social Connections: Socially Integrated (07/21/2022)   Social Connection and Isolation Panel [NHANES]    Frequency of Communication with Friends and Family: Twice a week    Frequency of Social Gatherings with Friends and Family: Twice a week    Attends Religious Services: More than 4 times per year    Active Member of [employer] or Organizations: Yes    Attends Engineer, structural: More than 4 times per year    Marital Status: Married    Tobacco Counseling Counseling given: Not Answered   Clinical Intake:  Pre-visit preparation completed: Yes  Pain : 0-10 Pain Score: 4  Pain Type: Chronic pain Pain Location: Back Pain Orientation: Lower     Diabetes: No  How often do you need to have someone help you when you read instructions, pamphlets, or other written materials from your doctor or pharmacy?: 1 - Never  Diabetic?no  Interpreter  Needed?:  No  Information entered by :: Kennedy Bucker, LPN   Activities of Daily Living    07/21/2022   11:07 AM  In your present state of health, do you have any difficulty performing the following activities:  Hearing? 0  Vision? 0  Difficulty concentrating or making decisions? 0  Walking or climbing stairs? 1  Comment legs are stiff  Dressing or bathing? 0  Doing errands, shopping? 0  Preparing Food and eating ? N  Using the Toilet? N  In the past six months, have you accidently leaked urine? N  Do you have problems with loss of bowel control? N  Managing your Medications? N  Managing your Finances? N  Housekeeping or managing your Housekeeping? Y    Patient Care Team: Dorcas Carrow, DO as PCP - General (Family Medicine) Tat, Octaviano Batty, DO as Consulting Physician (Neurology) Marlene Bast [facility]) Mariah Milling Tollie Pizza, MD as Consulting Physician (Cardiology) Gwyneth Revels, DPM as Referring Physician (Podiatry)  Indicate any recent Medical Services you may have received from other than [facility] providers in the past year (date may be approximate).     Assessment:   This is a routine wellness examination for Joandri.  Hearing/Vision screen Hearing Screening - Comments:: No aids Vision Screening - Comments:: Wears glasses- Dr.Woodard  Dietary issues and exercise activities discussed: Current Exercise Habits: Home exercise routine, Type of exercise: walking, Time (Minutes): 60, Frequency (Times/Week): 3, Weekly Exercise (Minutes/Week): 180, Intensity: Mild   Goals Addressed             This Visit's Progress    DIET - EAT MORE FRUITS AND VEGETABLES         Depression Screen    07/21/2022   11:05 AM 01/22/2022   10:05 AM 08/15/2021   10:55 AM 07/18/2021    2:14 PM 07/15/2021   11:33 AM 07/13/2020    1:03 PM 07/10/2020    3:30 PM  PHQ 2/9 Scores  PHQ - 2 Score 0 1 0 1 1 1 1   PHQ- 9 Score 0 6 4 9 6       Fall Risk    07/21/2022   11:06 AM 04/04/2022   11:03  AM 01/22/2022   10:06 AM 09/16/2021    2:06 PM 07/18/2021    2:14 PM  Fall Risk   Falls in the past year? 0 0 0 0 0  Number falls in past yr: 0 0 0 0 1  Injury with Fall? 0 0 0 0 0  Risk for fall due to : No Fall Risks  Impaired balance/gait  History of fall(s);Impaired balance/gait  Follow up Falls prevention discussed;Falls evaluation completed  Falls evaluation completed  Falls evaluation completed    FALL RISK PREVENTION PERTAINING TO THE HOME:  Any stairs in or around the home? Yes  If so, are there any without handrails? No  Home free of loose throw rugs in walkways, pet beds, electrical cords, etc? Yes  Adequate lighting in your home to reduce risk of falls? Yes   ASSISTIVE DEVICES UTILIZED TO PREVENT FALLS:  Life alert? No  Use of a cane, walker or w/c? Yes - walker occasionally Grab bars in the bathroom? Yes  Shower chair or bench in shower? Yes  Elevated toilet seat or a handicapped toilet? Yes    Cognitive Function:    04/23/2018    3:23 PM 07/27/2017    9:12 AM 03/27/2017   12:05 PM  MMSE - Mini Mental State Exam  Not completed:  Unable to complete Unable to complete Unable to complete        07/21/2022   11:11 AM 07/13/2020    1:05 PM 06/03/2018    9:19 AM 05/01/2017   10:27 AM 03/28/2016    9:57 AM  6CIT Screen  What Year? 0 points 0 points 0 points 0 points 0 points  What month? 0 points 0 points 0 points 0 points 0 points  What time? 0 points 0 points 0 points 0 points 0 points  Count back from 20 0 points 2 points 0 points 0 points 0 points  Months in reverse 0 points 0 points 0 points 0 points 0 points  Repeat phrase 2 points 2 points 0 points 0 points 2 points  Total Score 2 points 4 points 0 points 0 points 2 points    Immunizations Immunization History  Administered Date(s) Administered   Fluad Quad(high Dose 65+) 01/10/2019, 12/27/2019, 01/10/2021, 01/22/2022   Influenza, High Dose Seasonal PF 02/13/2017, 12/16/2017   Influenza,inj,Quad PF,6+ Mos  02/05/2015   Influenza-Unspecified 02/05/2015, 01/27/2016   PFIZER(Purple Top)SARS-COV-2 Vaccination 06/06/2019, 06/27/2019   Pneumococcal Conjugate-13 06/18/2015   Pneumococcal Polysaccharide-23 05/01/2017   Tdap 02/16/2014   Zoster, Live 12/16/2014    TDAP status: Up to date  Flu Vaccine status: Up to date  Pneumococcal vaccine status: Up to date  Covid-19 vaccine status: Completed vaccines  Qualifies for Shingles Vaccine? Yes   Zostavax completed Yes   Shingrix Completed?: No.    Education has been provided regarding the importance of this vaccine. Patient has been advised to call insurance company to determine out of pocket expense if they have not yet received this vaccine. Advised may also receive vaccine at local pharmacy or Health Dept. Verbalized acceptance and understanding.  Screening Tests Health Maintenance  Topic Date Due   Zoster Vaccines- Shingrix (1 of 2) Never done   COVID-19 Vaccine (3 - Pfizer risk series) 07/25/2019   COLONOSCOPY (Pts [id number] Insurance coverage will need to be confirmed)  07/27/2022   INFLUENZA VACCINE  11/13/2022   Medicare Annual Wellness (AWV)  07/21/2023   DTaP/Tdap/Td (2 - Td or Tdap) 02/17/2024   Pneumonia Vaccine 765+ Years old  Completed   Hepatitis C Screening  Completed   HPV VACCINES  Aged Out   Fecal DNA (Cologuard)  Discontinued    Health Maintenance  Health Maintenance Due  Topic Date Due   Zoster Vaccines- Shingrix (1 of 2) Never done   COVID-19 Vaccine (3 - Pfizer risk series) 07/25/2019    Colorectal cancer screening: Type of screening: Colonoscopy. Completed 07/27/19. Repeat every 3 years- MD says not sure whether he should have another one or not per pt  Lung Cancer Screening: (Low Dose CT Chest recommended if Age 26-80 years, 30 pack-year currently smoking OR have quit w/in 15years.) does not qualify.   Additional Screening:  Hepatitis C Screening: does qualify; Completed 08/23/15  Vision Screening:  Recommended annual ophthalmology exams for early detection of glaucoma and other disorders of the eye. Is the patient up to date with their annual eye exam?  Yes  Who is the provider or what is the name of the office in which the patient attends annual eye exams? Dr.Woodard If pt is not established with a provider, would they like to be referred to a provider to establish care? No .   Dental Screening: Recommended annual dental exams for proper oral hygiene  Community Resource Referral / Chronic Care Management: CRR required this visit?  No  CCM required this visit?  No      Plan:     I have personally reviewed and noted the following in the patient's chart:   Medical and social history Use of alcohol, tobacco or illicit drugs  Current medications and supplements including opioid prescriptions. Patient is not currently taking opioid prescriptions. Functional ability and status Nutritional status Physical activity Advanced directives List of other physicians Hospitalizations, surgeries, and ER visits in previous 12 months Vitals Screenings to include cognitive, depression, and falls Referrals and appointments  In addition, I have reviewed and discussed with patient certain preventive protocols, quality metrics, and best practice recommendations. A written personalized care plan for preventive services as well as general preventive health recommendations were provided to patient.     Hal Hope, LPN   06/12/5398   Nurse Notes: none

## 2022-07-21 NOTE — Patient Instructions (Signed)
[name] , Thank you for taking time to come for your Medicare Wellness Visit. I appreciate your ongoing commitment to your health goals. Please review the following plan we discussed and let me know if I can assist you in the future.   These are the goals we discussed:  Goals       CCM Expected Outcome:  Monitor, Self-Manage and Reduce Symptoms of Afib      Needs help with cost of long acting anticoagulant      DIET - EAT MORE FRUITS AND VEGETABLES      DIET - INCREASE WATER INTAKE      Recommend drinking at least 6-8 glasses of water a day       Patient Stated      07/13/2020, wants to lose 5 pounds      Patient Stated      Keep up exercise up to help with parkinson      PharmD "I want to make sure his medications are safe" (pt-stated)      Current Barriers:  Polypharmacy; complex patient with multiple comorbidities including Parkinson's disease, atrial fibrillation (hx DVT, PE), HTN, BPH, insomnia Wife assists in managing medications PD: follows w/ Dr. Arbutus [name]. Carbidopa/levodopa 25/100, 2 tab at 6 am, 10 am, 2 pm, 6 pm; carbidopa/levodopa 50/200 QPM; amantadine 100 mg TID, ropinirole 2 mg TID. Had been prescribed SL apomorphine, however, d/t improvement w/ amantadine and cost of SL apomorphine, declined this medication. Notes he received the hospital bed rail and it has been immensely helpful  Afib, HTN: Xarelto 20 mg QPM; metoprolol succinate 25 mg daily, lisinopril 40 mg daily (10 mg + 30 mg tab), amlodipine 2.5 mg PRN (has not needed) ASCVD risk reduction: simvastatin 40 mg daily; LDL well controlled <70 BPH: tamsulosin 0.4 mg daily; sildenafil 20 mg PRN Insomnia: Trazodone 25 mg QPM + melatonin 2.5 mg QPM; notes this combination has been effective   Pharmacist Clinical Goal(s):  Over the next 90 days, patient will work with PharmD and provider towards optimized medication management  Interventions: Comprehensive medication review performed; medication list updated in electronic  medical record Patient's wife asked if there are any drug interactions or dangers of renal/hepatic impairment from his regimen. Reviewed interactions. Discussed that multiple medications can cause CNS sedation, and to be cognizant for increased sedation with aging or dose increases. Discussed that PD medications and BP medications increase risk of hypotension; continue to monitor for s/sx orthostatic hypotension If patient starts to need amlodipine regularly, recommend reducing simvasatin to 20 mg daily or changing to alternative statin, d/t DDI w/ amlodipine and simvastatin that increases simvastatin concentrations/risk for myalgias Patient and wife deny and cost concerns at this time.   Patient Self Care Activities:  Patient will take medications as prescribed  Initial goal documentation       RN- I want to stay independent (pt-stated)      Current Barriers:  Chronic Disease Management support and education needs related to Parkinson's Disease  Nurse Case Manager Clinical Goal(s):  Over the next 120 days, patient will work with Thomas [name] IncRNCM  to address needs related to remaining independent   Interventions:  Discussed plans with patient for ongoing care management follow up and provided patient with direct contact information for care management team Evaluated the use of the bed rail.  The patient states this is very helpful for him and he is glad he has this to use- completed  Assessed new safety concerns with the patient.  The  patient uses a "hemi-walker" in the am for about 30 minutes before attempting to ambulate without assistance.  The patient feels safe in his home environment. The patient is having some difficulty with getting up and down when sitting and moves to the edge of the chair. The patient is doing well with this process. Inquired about a lift chair and the patient does not want to get a lift chair until he can no longer independently get up and down out of the chair. Has worked with  PT. The patient is doing exercises that help with muscle strength. The patient had a fall with injury 2 to 3 weeks ago. The patient had an abrasion area. Carpet burn to his shoulder. The patients wife states it got infected and puss was coming out of it. The patient went to urgent care on Friday and has been taking an antibiotic. Has 3 more days of antibiotic left to take. Education on taking all of antibiotic. The patient was coming down the steps and missed the last step. The patients wife is monitoring the area and says it is scabbed over and healing now. 10-18-2019: the area has healed completely and the patient has not had any new falls.  Assessed activity level and the patient is going to "Thomas [name] and "Thomas [name]". The patient states he feels much better once he has had a work out. The patient praised for positive healthy habits. The patient enjoys boxing at least 3 times a week and socializing with his friends by going out for coffee. The patient is still going to do boxing.  The patient states he knows there is a change in his level of endurance. The patient states he is stuttering more and also that he is "spent out" if he tries to walk over 30 minutes. 10-18-2019: the patient still is keeping his routine. The patients sister made a surprise visit from [facility] [name] and was with the patient and his wife from 10-12-2019 to 10/16/2019.  This made him "go down hill" a little bit but he is doing better since back to his full routine. The patient is still driving and going to work out. They had a trip to [place] which was good for both the patient and his wife.  The patient has consultation appointment on 10-27-2019 with Thomas doctor. Will have right Thomas cataract removal on 11-08-2019. Will have his other Thomas done on 12-06-2019. The wife and the patient are happy that he will not have to go off of his medications to have the surgery.  Evaluation of upcoming appointments: Sees Thomas [name] on 12-27-2019  Patient Self Care  Activities:  Attends all scheduled provider appointments Performs ADL's independently Patient wanting to stay independent as long as possible  Please see past updates related to this goal by clicking on the "Past Updates" button in the selected goal        RNCM: I am taking my blood pressure 2 times a day.  I have a new pill I am taking for my blood pressure      Current Barriers:  Chronic Disease Management support and education needs related to HTN and HLD   Nurse Case Manager Clinical Goal(s):  Over the next 120 days, patient will verbalize understanding of plan for HTN and HLD Over the next 120 days, patient will attend all scheduled medical appointments: next appointment scheduled colonoscopy on 07-26-2019- completed  Over the next 120 days, patient will demonstrate improved adherence to prescribed treatment plan for HTN and  HLD as evidenced by continuing to monitor blood pressure BID, and adherence to a low sodium, heart healthy diet Over the next 120 days, patient will demonstrate improved health management independence as evidenced by continuing to independently maintain health and well being with the assistance of his spouse when needed Over the next 90 days, patient will work with CM team pharmacist to effectively manage medications and monitor for any issues such as orthostatic hypotension related to the patient taking amlodipine 2.5mg    Interventions:  Evaluation of current treatment plan related to HTN and HLD and patient's adherence to plan as established by provider. Provided education to patient re: orthostatic hypotension, changing position slowly and safety concerns in the patient with multiple chronic disease processes Discussed plans with patient for ongoing care management follow up and provided patient with direct contact information for care management team Advised patient, providing education and rationale, to monitor blood pressure daily and record, calling pcp for  findings outside established parameters.  Reviewed scheduled/upcoming provider appointments including: CCM team and scheduled colonoscopy on 07/26/2019. Patient states that there was an abnormal "colon guard" result so he is following up with a colonoscopy. Per the patients wife this did not go well and the patient will not have another colonoscopy.  There were some polyps removed but the patient had to do the prep x 2 and it really took a log out of him. They have decided no more colonoscopies.  The patient verbalized that he is still having issues with blood pressure fluctuations. This is no new issue and is known by pcp.  He denies dizziness or light headiness when his pressure drops in the 80's or 90's Systolic. He is keeping a good record of his blood pressure readings and has parameters to follow.  His wife verbalized she has salty snacks for him to eat when it drops really low. The patient verbalized he thinks this is due to the progression of his parkinson's. 10-18-2019: the patient feels overall he is doing well with his health. He has noticed that the medications seems to be wearing out sooner. The patient will talk to Dr. Leta Jungling concerning this.  Pharmacy referral for medication management and follow up for any new concerns related to addition of amlodipine to the current treatment regimen. Ongoing support and help with medication management  Assessed patients activity level. He is going to [employer] and [facility] [name] and says he feels better once he has had a work out session.   Patient Self Care Activities:  Patient verbalizes understanding of plan to manage HTN and HLD Attends all scheduled provider appointments Calls provider office for new concerns or questions Unable to independently manage chronic conditions of HTN and HLD  Please see past updates related to this goal by clicking on the "Past Updates" button in the selected goal        RNCM: pt's wife: "I need information on a support to help  with Greg's head tilting." (pt-stated)      CARE PLAN ENTRY (see longitudinal plan of care for additional care plan information)  Current Barriers:  Knowledge Deficits related to head supports for patient with Parkinson's with new onset of head tilt to the right.  Care Coordination needs related to support devices  in a patient with Parkinson's  (disease states) Chronic Disease Management support and education needs related to Parkinson's disease and progression and symptom management   Nurse Case Manager Clinical Goal(s):  Over the next 120 days, patient will work with  RNCM, CCM team and pcp  to address needs related to support devices to use in the patient with head tilt to the right related to progression of Parkinson's  Over the next 120 days, patient will demonstrate a decrease in lack of head control exacerbations as evidenced by having support devices that will assist with health and wellness  Interventions:  Inter-disciplinary care team collaboration (see longitudinal plan of care) Evaluation of current treatment plan related to Parkinson's Disease and patient's adherence to plan as established by provider. Advised patient to look for email correspondence related to supports to help with neck. Done  Provided education to patient re: current available information on neck supports. The patients wife verbalized the support needs to be flat in the back. A travel pillow does not work well for the patient.  The wife is wanting help the patient find something that will support his neck and shoulders. Education and support gathered and sent to the patients wife by email at [email or web address].  Also sent information by the [name] system. Will continue to research and send related information to the patient and patients wife. Per the patients wife, an elderly friend from church has taken the travel pillow and shaved some of the stuffing from the back and this is helping the patient much better. They  are concerned that some of the devices used to support the head are bulky and make the patient look like he is an invalid. The patient wants to remain as independent as possible.  Collaborated with RNCM colleagues and pcp regarding support devices for head tilt in patient with Parkinson's.  The pcp recommended the patient and wife reaching out to the neurologist for specific help with recommendations for neck support. 10-18-2019: the patient is doing well with his care and the support device he is using is beneficial at this time.  Discussed plans with patient for ongoing care management follow up and provided patient with direct contact information for care management team Provided patient with Parkinson's  educational materials related to activities, supports to use in home and other resources for better understanding of Parkinson's and the disease progression.   Patient Self Care Activities:  Patient verbalizes understanding of plan to receive information by email on support devices for head tilt in patient with Parkinson's  Calls provider office for new concerns or questions Unable to independently control tilting of head to the right in a patient with progression of Parkinson's   Please see past updates related to this goal by clicking on the "Past Updates" button in the selected goal          This is a list of the screening recommended for you and due dates:  Health Maintenance  Topic Date Due   Zoster (Shingles) Vaccine (1 of 2) Never done   COVID-19 Vaccine (3 - Pfizer risk series) 07/25/2019   Colon Cancer Screening  07/27/2022   Flu Shot  11/13/2022   Medicare Annual Wellness Visit  07/21/2023   DTaP/Tdap/Td vaccine (2 - Td or Tdap) 02/17/2024   Pneumonia Vaccine  Completed   Hepatitis C Screening: USPSTF Recommendation to screen - Ages 52-79 yo.  Completed   HPV Vaccine  Aged Out   Cologuard (Stool DNA test)  Discontinued    Advanced directives: no  Conditions/risks  identified: none  Next appointment: Follow up in one year for your annual wellness visit. 07/27/23 @ 10:45 am by phone  Preventive Care 65 Years and Older, Male  Preventive care refers to lifestyle  choices and visits with your health care provider that can promote health and wellness. What does preventive care include? A yearly physical exam. This is also called an annual well check. Dental exams once or twice a year. Routine Thomas exams. Ask your health care provider how often you should have your eyes checked. Personal lifestyle choices, including: Daily care of your teeth and gums. Regular physical activity. Eating a healthy diet. Avoiding tobacco and drug use. Limiting alcohol use. Practicing safe sex. Taking low doses of aspirin every day. Taking vitamin and mineral supplements as recommended by your health care provider. What happens during an annual well check? The services and screenings done by your health care provider during your annual well check will depend on your age, overall health, lifestyle risk factors, and family history of disease. Counseling  Your health care provider may ask you questions about your: Alcohol use. Tobacco use. Drug use. Emotional well-being. Home and relationship well-being. Sexual activity. Eating habits. History of falls. Memory and ability to understand (cognition). Work and work Astronomer. Screening  You may have the following tests or measurements: Height, weight, and BMI. Blood pressure. Lipid and cholesterol levels. These may be checked every 5 years, or more frequently if you are over 16 years old. Skin check. Lung cancer screening. You may have this screening every year starting at age 44 if you have a 30-pack-year history of smoking and currently smoke or have quit within the past 15 years. Fecal occult blood test (FOBT) of the stool. You may have this test every year starting at age 45. Flexible sigmoidoscopy or colonoscopy.  You may have a sigmoidoscopy every 5 years or a colonoscopy every 10 years starting at age 42. Prostate cancer screening. Recommendations will vary depending on your family history and other risks. Hepatitis C blood test. Hepatitis B blood test. Sexually transmitted disease (STD) testing. Diabetes screening. This is done by checking your blood sugar (glucose) after you have not eaten for a while (fasting). You may have this done every 1-3 years. Abdominal aortic aneurysm (AAA) screening. You may need this if you are a current or former smoker. Osteoporosis. You may be screened starting at age 25 if you are at high risk. Talk with your health care provider about your test results, treatment options, and if necessary, the need for more tests. Vaccines  Your health care provider may recommend certain vaccines, such as: Influenza vaccine. This is recommended every year. Tetanus, diphtheria, and acellular pertussis (Tdap, Td) vaccine. You may need a Td booster every 10 years. Zoster vaccine. You may need this after age 44. Pneumococcal 13-valent conjugate (PCV13) vaccine. One dose is recommended after age 50. Pneumococcal polysaccharide (PPSV23) vaccine. One dose is recommended after age 65. Talk to your health care provider about which screenings and vaccines you need and how often you need them. This information is not intended to replace advice given to you by your health care provider. Make sure you discuss any questions you have with your health care provider. Document Released: 04/27/2015 Document Revised: 12/19/2015 Document Reviewed: 01/30/2015 Elsevier Interactive Patient Education  2017 [employer].  Fall Prevention in the Home Falls can cause injuries. They can happen to people of all ages. There are many things you can do to make your home safe and to help prevent falls. What can I do on the outside of my home? Regularly fix the edges of walkways and driveways and fix any  cracks. Remove anything that might make you trip as  you walk through a door, such as a raised step or threshold. Trim any bushes or trees on the path to your home. Use bright outdoor lighting. Clear any walking paths of anything that might make someone trip, such as rocks or tools. Regularly check to see if handrails are loose or broken. Make sure that both sides of any steps have handrails. Any raised decks and porches should have guardrails on the edges. Have any leaves, snow, or ice cleared regularly. Use sand or salt on walking paths during winter. Clean up any spills in your garage right away. This includes oil or grease spills. What can I do in the bathroom? Use night lights. Install grab bars by the toilet and in the tub and shower. Do not use towel bars as grab bars. Use non-skid mats or decals in the tub or shower. If you need to sit down in the shower, use a plastic, non-slip stool. Keep the floor dry. Clean up any water that spills on the floor as soon as it happens. Remove soap buildup in the tub or shower regularly. Attach bath mats securely with double-sided non-slip rug tape. Do not have throw rugs and other things on the floor that can make you trip. What can I do in the bedroom? Use night lights. Make sure that you have a light by your bed that is easy to reach. Do not use any sheets or blankets that are too big for your bed. They should not hang down onto the floor. Have a firm chair that has side arms. You can use this for support while you get dressed. Do not have throw rugs and other things on the floor that can make you trip. What can I do in the kitchen? Clean up any spills right away. Avoid walking on wet floors. Keep items that you use a lot in easy-to-reach places. If you need to reach something above you, use a strong step stool that has a grab bar. Keep electrical cords out of the way. Do not use floor polish or wax that makes floors slippery. If you must  use wax, use non-skid floor wax. Do not have throw rugs and other things on the floor that can make you trip. What can I do with my stairs? Do not leave any items on the stairs. Make sure that there are handrails on both sides of the stairs and use them. Fix handrails that are broken or loose. Make sure that handrails are as long as the stairways. Check any carpeting to make sure that it is firmly attached to the stairs. Fix any carpet that is loose or worn. Avoid having throw rugs at the top or bottom of the stairs. If you do have throw rugs, attach them to the floor with carpet tape. Make sure that you have a light switch at the top of the stairs and the bottom of the stairs. If you do not have them, ask someone to add them for you. What else can I do to help prevent falls? Wear shoes that: Do not have high heels. Have rubber bottoms. Are comfortable and fit you well. Are closed at the toe. Do not wear sandals. If you use a stepladder: Make sure that it is fully opened. Do not climb a closed stepladder. Make sure that both sides of the stepladder are locked into place. Ask someone to hold it for you, if possible. Clearly mark and make sure that you can see: Any grab bars or handrails. First  and last steps. Where the edge of each step is. Use tools that help you move around (mobility aids) if they are needed. These include: Canes. Walkers. Scooters. Crutches. Turn on the lights when you go into a dark area. Replace any light bulbs as soon as they burn out. Set up your furniture so you have a clear path. Avoid moving your furniture around. If any of your floors are uneven, fix them. If there are any pets around you, be aware of where they are. Review your medicines with your doctor. Some medicines can make you feel dizzy. This can increase your chance of falling. Ask your doctor what other things that you can do to help prevent falls. This information is not intended to replace  advice given to you by your health care provider. Make sure you discuss any questions you have with your health care provider. Document Released: 01/25/2009 Document Revised: 09/06/2015 Document Reviewed: 05/05/2014 Elsevier Interactive Patient Education  2017 [employer].

## 2022-07-25 DIAGNOSIS — M5136 Other intervertebral disc degeneration, lumbar region: Secondary | ICD-10-CM | POA: Diagnosis not present

## 2022-07-25 DIAGNOSIS — Z79899 Other long term (current) drug therapy: Secondary | ICD-10-CM | POA: Diagnosis not present

## 2022-07-25 DIAGNOSIS — M5126 Other intervertebral disc displacement, lumbar region: Secondary | ICD-10-CM | POA: Diagnosis not present

## 2022-07-25 DIAGNOSIS — M48062 Spinal stenosis, lumbar region with neurogenic claudication: Secondary | ICD-10-CM | POA: Diagnosis not present

## 2022-07-25 DIAGNOSIS — M5416 Radiculopathy, lumbar region: Secondary | ICD-10-CM | POA: Diagnosis not present

## 2022-07-28 ENCOUNTER — Encounter: Payer: Self-pay | Admitting: Family Medicine

## 2022-07-28 ENCOUNTER — Ambulatory Visit (INDEPENDENT_AMBULATORY_CARE_PROVIDER_SITE_OTHER): Payer: Medicare HMO | Admitting: Family Medicine

## 2022-07-28 VITALS — BP 141/75 | HR 55 | Temp 97.6°F | Ht 69.0 in | Wt 230.1 lb

## 2022-07-28 DIAGNOSIS — D6851 Activated protein C resistance: Secondary | ICD-10-CM | POA: Diagnosis not present

## 2022-07-28 DIAGNOSIS — I480 Paroxysmal atrial fibrillation: Secondary | ICD-10-CM

## 2022-07-28 DIAGNOSIS — G20A1 Parkinson's disease without dyskinesia, without mention of fluctuations: Secondary | ICD-10-CM | POA: Diagnosis not present

## 2022-07-28 DIAGNOSIS — Z0000 Encounter for general adult medical examination without abnormal findings: Secondary | ICD-10-CM | POA: Diagnosis not present

## 2022-07-28 DIAGNOSIS — F3341 Major depressive disorder, recurrent, in partial remission: Secondary | ICD-10-CM | POA: Diagnosis not present

## 2022-07-28 DIAGNOSIS — R350 Frequency of micturition: Secondary | ICD-10-CM

## 2022-07-28 DIAGNOSIS — I82502 Chronic embolism and thrombosis of unspecified deep veins of left lower extremity: Secondary | ICD-10-CM | POA: Diagnosis not present

## 2022-07-28 DIAGNOSIS — N401 Enlarged prostate with lower urinary tract symptoms: Secondary | ICD-10-CM | POA: Diagnosis not present

## 2022-07-28 DIAGNOSIS — I10 Essential (primary) hypertension: Secondary | ICD-10-CM

## 2022-07-28 DIAGNOSIS — E7800 Pure hypercholesterolemia, unspecified: Secondary | ICD-10-CM | POA: Diagnosis not present

## 2022-07-28 DIAGNOSIS — F0670 Mild neurocognitive disorder due to known physiological condition without behavioral disturbance: Secondary | ICD-10-CM

## 2022-07-28 LAB — URINALYSIS, ROUTINE W REFLEX MICROSCOPIC
Bilirubin, UA: NEGATIVE
Glucose, UA: NEGATIVE
Ketones, UA: NEGATIVE
Leukocytes,UA: NEGATIVE
Nitrite, UA: NEGATIVE
Protein,UA: NEGATIVE
RBC, UA: NEGATIVE
Specific Gravity, UA: <1.005 — ABNORMAL LOW (ref 1.005–1.030)
Urobilinogen, Ur: 0.2 mg/dL (ref 0.2–1.0)
pH, UA: 6 (ref 5.0–7.5)

## 2022-07-28 LAB — MICROALBUMIN, URINE WAIVED
Creatinine, Urine Waived: 10 mg/dL (ref 10–300)
Microalb, Ur Waived: 10 mg/L (ref 0–19)

## 2022-07-28 MED ORDER — TAMSULOSIN HCL 0.4 MG PO CAPS: 0.8000 mg | ORAL_CAPSULE | Freq: Every day | ORAL | 1 refills | Status: DC

## 2022-07-28 MED ORDER — AMLODIPINE BESYLATE 2.5 MG PO TABS: ORAL_TABLET | ORAL | 1 refills | Status: DC

## 2022-07-28 MED ORDER — RIVAROXABAN 20 MG PO TABS: 20.0000 mg | ORAL_TABLET | Freq: Every day | ORAL | 1 refills | Status: DC

## 2022-07-28 MED ORDER — METOPROLOL SUCCINATE ER 25 MG PO TB24: 25.0000 mg | ORAL_TABLET | Freq: Every day | ORAL | 1 refills | Status: DC

## 2022-07-28 MED ORDER — LISINOPRIL 30 MG PO TABS: 30.0000 mg | ORAL_TABLET | Freq: Every day | ORAL | 1 refills | Status: DC

## 2022-07-28 MED ORDER — TRAZODONE HCL 50 MG PO TABS: 50.0000 mg | ORAL_TABLET | Freq: Every day | ORAL | 1 refills | Status: DC

## 2022-07-28 MED ORDER — SIMVASTATIN 40 MG PO TABS: 40.0000 mg | ORAL_TABLET | Freq: Every day | ORAL | 2 refills | Status: DC

## 2022-07-28 NOTE — Progress Notes (Signed)
BP (!) 141/75   Pulse (!) 55   Temp 97.6 F (36.4 C) (Oral)   Ht 5\' 9"  (1.753 m)   Wt 230 lb 1.6 oz (104.4 kg)   SpO2 97%   BMI 33.98 kg/m    Subjective:    Patient ID: Thomas [name], male    DOB: 1951/02/12, 72 y.o.   MRN: [id number]  HPI: Thomas [name] is a 72 y.o. male presenting on 07/28/2022 for comprehensive medical examination. Current medical complaints include:  HYPERTENSION / HYPERLIPIDEMIA Satisfied with current treatment? yes Duration of hypertension: chronic BP monitoring frequency: not checking BP medication side effects: no Past BP meds: lisinopril, amlodipine, hydralazine, metoprolol Duration of hyperlipidemia: chronic Cholesterol medication side effects: no Cholesterol supplements: none Past cholesterol medications: simvastatin Medication compliance: good compliance Aspirin: no Recent stressors: no Recurrent headaches: no Visual changes: no Palpitations: no Dyspnea: no Chest pain: no Lower extremity edema: no Dizzy/lightheaded: no  DEPRESSION Mood status: stable Satisfied with current treatment?: yes Symptom severity: mild  Duration of current treatment : N/A Depressed mood: yes Anxious mood: no Anhedonia: no Significant weight loss or gain: no Insomnia: yes  Fatigue: yes Feelings of worthlessness or guilt: no Impaired concentration/indecisiveness: no Suicidal ideations: no Hopelessness: no Crying spells: no    07/28/2022   10:46 AM 07/21/2022   11:05 AM 01/22/2022   10:05 AM 08/15/2021   10:55 AM 07/18/2021    2:14 PM  Depression screen PHQ 2/9  Decreased Interest 1 0 1 0 1  Down, Depressed, Hopeless 1 0 0 0 0  PHQ - 2 Score 2 0 1 0 1  Altered sleeping 0 0 1 0 2  Tired, decreased energy 1 0 1  1  Change in appetite 0 0 0 0 0  Feeling bad or failure about yourself  0 0 0 0 0  Trouble concentrating 3 0 1 2 2   Moving slowly or fidgety/restless 3 0 2 2 3   Suicidal thoughts 0 0 0 0 0  PHQ-9 Score 9 0 6 4 9   Difficult doing work/chores  Somewhat difficult Not difficult at all Somewhat difficult Somewhat difficult    BPH BPH status: uncontrolled Satisfied with current treatment?: no Medication side effects: no Medication compliance: excellent compliance Duration: chronic Nocturia: 3-4x per night Urinary frequency:yes Incomplete voiding: no Urgency: yes Weak urinary stream: no Straining to start stream: no Dysuria: no Onset: gradual Severity: moderate  He currently lives with: wife Interim Problems from his last visit: no  Depression Screen done today and results listed below:     07/28/2022   10:46 AM 07/21/2022   11:05 AM 01/22/2022   10:05 AM 08/15/2021   10:55 AM 07/18/2021    2:14 PM  Depression screen PHQ 2/9  Decreased Interest 1 0 1 0 1  Down, Depressed, Hopeless 1 0 0 0 0  PHQ - 2 Score 2 0 1 0 1  Altered sleeping 0 0 1 0 2  Tired, decreased energy 1 0 1  1  Change in appetite 0 0 0 0 0  Feeling bad or failure about yourself  0 0 0 0 0  Trouble concentrating 3 0 1 2 2   Moving slowly or fidgety/restless 3 0 2 2 3   Suicidal thoughts 0 0 0 0 0  PHQ-9 Score 9 0 6 4 9   Difficult doing work/chores Somewhat difficult Not difficult at all Somewhat difficult Somewhat difficult     Past Medical History:  Past Medical History:  Diagnosis Date   Activated  protein C resistance 06/28/2019   Arthritis of knee    Arthropathy of lumbar facet joint    Atrial fibrillation    BPH (benign prostatic hyperplasia) 09/10/2018   DVT (deep venous thrombosis)    L leg   Dysplastic nevus 05/15/2021   Left Superior Pectoral, mod to severe, Excised 07/16/21   Erectile dysfunction 03/01/2015   Herniated lumbar disc without myelopathy 10/11/2020   Hyperchloremia    Hypercholesterolemia    Hypertension    Inability to walk 02/22/2021   Insomnia due to medical condition 09/30/2018   Major depressive disorder 01/22/2022   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   Nephrolithiasis     Osteoarthritis    Parkinson's disease    Plantar wart    Pneumonia    Post-operative nausea and vomiting 09/20/2020   Pulmonary embolism    Sciatica    Seasonal allergies    TIA (transient ischemic attack)     Surgical History:  Past Surgical History:  Procedure Laterality Date   APPENDECTOMY     CATARACT EXTRACTION Bilateral 10/2019 11/2019   COLONOSCOPY WITH PROPOFOL N/A 07/27/2019   Procedure: COLONOSCOPY WITH PROPOFOL;  Surgeon: Toney Reil, MD;  Location: [facility] ENDOSCOPY;  Service: Endoscopy;  Laterality: N/A;   EYE SURGERY     KNEE ARTHROSCOPY WITH MENISCAL REPAIR Right 2009   KNEE SURGERY Right    LUMBAR LAMINECTOMY/DECOMPRESSION MICRODISCECTOMY Left 10/11/2020   Procedure: Left Lumbar Three-Four, Lumbar Fou-Five  Microdiscectomy;  Surgeon: Maeola Harman, MD;  Location: [facility] OR;  Service: Neurosurgery;  Laterality: Left;   melanoma removal Left 07/16/2021   chest   T&A     TONSILLECTOMY      Medications:  Current Outpatient Medications on File Prior to Visit  Medication Sig   acetaminophen (TYLENOL) 500 MG tablet Take 1,000 mg by mouth daily.   amantadine (SYMMETREL) 100 MG capsule TAKE ONE CAPSULE BY MOUTH THREE TIMES A DAY   calcium carbonate (TUMS - DOSED IN MG ELEMENTAL CALCIUM) 500 MG chewable tablet Chew 1 tablet by mouth daily as needed for indigestion or heartburn.   carbidopa-levodopa (SINEMET CR) 50-200 MG tablet TAKE 1 TABLET BY MOUTH AT BEDTIME   carbidopa-levodopa (SINEMET IR) 25-100 MG tablet TAKE TWO TABLETS BY MOUTH FOUR TIMES A DAY AT 6AM, 10AM, 2:00PM AND 6PM   cetirizine (ZYRTEC) 10 MG tablet Take 10 mg by mouth at bedtime.   cholecalciferol (VITAMIN D3) 25 MCG (1000 UNIT) tablet Take 1,000 Units by mouth daily.   diazepam (VALIUM) 5 MG tablet Take 5 mg by mouth 2 (two) times daily as needed.   diclofenac Sodium (VOLTAREN) 1 % GEL APPLY TOPICALLY 4 GRAMS FOUR TIMES DAILY (Patient taking differently: Apply 4 g topically 4 (four) times daily as  needed (pain).)   hydrALAZINE (APRESOLINE) 25 MG tablet Take 1 tablet (25 mg total) by mouth 3 (three) times daily as needed (for SBP >160). Takes in the evening   HYDROcodone-acetaminophen (NORCO/VICODIN) 5-325 MG tablet Take by mouth.   Insulin Pen Needle (PEN NEEDLES) 30G X 5 MM MISC 1 each by Does not apply route daily.   Levodopa (INBRIJA) 42 MG CAPS Samples of this drug were given to the patient, quantity 1 box of 32 capsules, Lot Number [id number] Exp: 03/2023  Patient has been educated on dosage and uses of medication.   Levodopa (INBRIJA) 42 MG CAPS We are going to start inbrija.  Remember that TWO capsules is ONE dosage (never inhale just one capsule).  You  can inhale the capsules as needed up to 5 times per day, separated by 2 hour intervals.  Many patients use this right when the wake up to help with first morning "on" and then as needed during the day.  You should take a sip of water prior to using the inhaler to avoid side effects.  It may generate some cough right when you use it and that is normal.  There are nurse educators available to help you with this device.  You can call [phone] and they will set you up with a nurse educator to assist you for free of charge.  They are available 8am-8pm Monday-Friday.   Levodopa (INBRIJA) 42 MG CAPS Samples of this drug were given to the patient, quantity 1, Lot Number [id number] Exp 12-24   melatonin 5 MG TABS Take 5 mg by mouth at bedtime.   methocarbamol (ROBAXIN) 500 MG tablet Take 1 tablet (500 mg total) by mouth every 6 (six) hours as needed for muscle spasms.   Multiple Vitamin (MULTIVITAMIN) tablet Take 1 tablet by mouth daily at 6 PM.   polyethylene glycol powder (GLYCOLAX/MIRALAX) 17 GM/SCOOP powder Take 17 g by mouth 2 (two) times daily as needed. (Patient taking differently: Take 17 g by mouth at bedtime.)   PREVIDENT 5000 BOOSTER PLUS 1.1 % PSTE Place 1 application onto teeth 2 (two) times daily.   rOPINIRole (REQUIP) 3 MG  tablet TAKE ONE TABLET BY MOUTH THREE TIMES A DAY   No current facility-administered medications on file prior to visit.    Allergies:  Allergies  Allergen Reactions   Codeine Other (See Comments)    Hallucinations   Other Nausea Only    General anesthesia     Social History:  Social History   Socioeconomic History   Marital status: Married    Spouse name: Wilkie Aye   Number of children: 0   Years of education: 14   Highest education level: Some college, no degree  Occupational History   Occupation: Retired    Comment: Therapist, sports  Tobacco Use   Smoking status: Never   Smokeless tobacco: Never  Vaping Use   Vaping Use: Never used  Substance and Sexual Activity   Alcohol use: No    Alcohol/week: 0.0 standard drinks of alcohol   Drug use: No   Sexual activity: Not Currently  Other Topics Concern   Not on file  Social History Narrative   ** Merged History Encounter **    right handed   Two story home    Live with spouse   Social Determinants of Health   Financial Resource Strain: Low Risk  (07/21/2022)   Overall Financial Resource Strain (CARDIA)    Difficulty of Paying Living Expenses: Not hard at all  Food Insecurity: No Food Insecurity (07/21/2022)   Hunger Vital Sign    Worried About Running Out of Food in the Last Year: Never true    Ran Out of Food in the Last Year: Never true  Transportation Needs: No Transportation Needs (07/21/2022)   PRAPARE - Administrator, Civil Service (Medical): No    Lack of Transportation (Non-Medical): No  Physical Activity: Sufficiently Active (07/21/2022)   Exercise Vital Sign    Days of Exercise per Week: 3 days    Minutes of Exercise per Session: 60 min  Stress: No Stress Concern Present (07/21/2022)   [employer] of Occupational Health - Occupational Stress Questionnaire    Feeling of Stress : Only a little  Social  Connections: Socially Integrated (07/21/2022)   Social Connection and Isolation Panel [NHANES]     Frequency of Communication with Friends and Family: Twice a week    Frequency of Social Gatherings with Friends and Family: Twice a week    Attends Religious Services: More than 4 times per year    Active Member of [employer] or Organizations: Yes    Attends Engineer, structural: More than 4 times per year    Marital Status: Married  Catering manager Violence: Not At Risk (07/21/2022)   Humiliation, Afraid, Rape, and Kick questionnaire    Fear of Current or Ex-Partner: No    Emotionally Abused: No    Physically Abused: No    Sexually Abused: No   Social History   Tobacco Use  Smoking Status Never  Smokeless Tobacco Never   Social History   Substance and Sexual Activity  Alcohol Use No   Alcohol/week: 0.0 standard drinks of alcohol    Family History:  Family History  Problem Relation Age of Onset   Parkinson's disease Father    Coronary artery disease Mother    Heart attack Sister    Deep vein thrombosis Sister     Past medical history, surgical history, medications, allergies, family history and social history reviewed with patient today and changes made to appropriate areas of the chart.   Review of Systems  Constitutional: Negative.   HENT: Negative.    Eyes: Negative.   Respiratory: Negative.    Cardiovascular: Negative.   Gastrointestinal:  Positive for blood in stool and constipation. Negative for abdominal pain, diarrhea, heartburn, melena, nausea and vomiting.  Genitourinary:  Positive for urgency. Negative for dysuria, flank pain, frequency and hematuria.       + nocturia x3-4  Musculoskeletal:  Positive for back pain and myalgias. Negative for falls, joint pain and neck pain.  Skin: Negative.   Neurological: Negative.   Endo/Heme/Allergies:  Negative for environmental allergies and polydipsia. Bruises/bleeds easily.  Psychiatric/Behavioral: Negative.     All other ROS negative except what is listed above and in the HPI.      Objective:    BP (!)  141/75   Pulse (!) 55   Temp 97.6 F (36.4 C) (Oral)   Ht 5\' 9"  (1.753 m)   Wt 230 lb 1.6 oz (104.4 kg)   SpO2 97%   BMI 33.98 kg/m   Wt Readings from Last 3 Encounters:  07/28/22 230 lb 1.6 oz (104.4 kg)  07/21/22 228 lb (103.4 kg)  04/04/22 228 lb (103.4 kg)    Physical Exam Vitals and nursing note reviewed.  Constitutional:      General: He is not in acute distress.    Appearance: Normal appearance. He is obese. He is not ill-appearing, toxic-appearing or diaphoretic.  HENT:     Head: Normocephalic and atraumatic.     Right Ear: Tympanic membrane, ear canal and external ear normal. There is no impacted cerumen.     Left Ear: Tympanic membrane, ear canal and external ear normal. There is no impacted cerumen.     Nose: Nose normal. No congestion or rhinorrhea.     Mouth/Throat:     Mouth: Mucous membranes are moist.     Pharynx: Oropharynx is clear. No oropharyngeal exudate or posterior oropharyngeal erythema.  Eyes:     General: No scleral icterus.       Right eye: No discharge.        Left eye: No discharge.     Extraocular  Movements: Extraocular movements intact.     Conjunctiva/sclera: Conjunctivae normal.     Pupils: Pupils are equal, round, and reactive to light.  Neck:     Vascular: No carotid bruit.  Cardiovascular:     Rate and Rhythm: Normal rate and regular rhythm.     Pulses: Normal pulses.     Heart sounds: No murmur heard.    No friction rub. No gallop.  Pulmonary:     Effort: Pulmonary effort is normal. No respiratory distress.     Breath sounds: Normal breath sounds. No stridor. No wheezing, rhonchi or rales.  Chest:     Chest wall: No tenderness.  Abdominal:     General: Abdomen is flat. Bowel sounds are normal. There is no distension.     Palpations: Abdomen is soft. There is no mass.     Tenderness: There is no abdominal tenderness. There is no right CVA tenderness, left CVA tenderness, guarding or rebound.     Hernia: No hernia is present.   Genitourinary:    Comments: Genital exam deferred with shared decision making Musculoskeletal:        General: No swelling, tenderness, deformity or signs of injury.     Cervical back: Normal range of motion and neck supple. No rigidity. No muscular tenderness.     Right lower leg: No edema.     Left lower leg: No edema.  Lymphadenopathy:     Cervical: No cervical adenopathy.  Skin:    General: Skin is warm and dry.     Capillary Refill: Capillary refill takes less than 2 seconds.     Coloration: Skin is not jaundiced or pale.     Findings: No bruising, erythema, lesion or rash.  Neurological:     General: No focal deficit present.     Mental Status: He is alert and oriented to person, place, and time.     Cranial Nerves: No cranial nerve deficit.     Sensory: No sensory deficit.     Motor: No weakness.     Coordination: Coordination normal.     Gait: Gait normal.     Deep Tendon Reflexes: Reflexes normal.  Psychiatric:        Mood and Affect: Mood normal.        Behavior: Behavior normal.        Thought Content: Thought content normal.        Judgment: Judgment normal.     Results for orders placed or performed in visit on 07/28/22  Microalbumin, Urine Waived  Result Value Ref Range   Microalb, Ur Waived 10 0 - 19 mg/L   Creatinine, Urine Waived 10 10 - 300 mg/dL   Microalb/Creat Ratio 30-300 (H) <30 mg/g  Urinalysis, Routine w reflex microscopic  Result Value Ref Range   Specific Gravity, UA <1.005 (L) 1.005 - 1.030   pH, UA 6.0 5.0 - 7.5   Color, UA Yellow Yellow   Appearance Ur Clear Clear   Leukocytes,UA Negative Negative   Protein,UA Negative Negative/Trace   Glucose, UA Negative Negative   Ketones, UA Negative Negative   RBC, UA Negative Negative   Bilirubin, UA Negative Negative   Urobilinogen, Ur 0.2 0.2 - 1.0 mg/dL   Nitrite, UA Negative Negative   Microscopic Examination Comment       Assessment & Plan:   Problem List Items Addressed This Visit        Cardiovascular and Mediastinum   Hypertension    Running a little high.  Will increase his flomax. Continue to monitor. Call with any concerns. Labs drawn today.      Relevant Medications   amLODipine (NORVASC) 2.5 MG tablet   lisinopril (ZESTRIL) 30 MG tablet   metoprolol succinate (TOPROL-XL) 25 MG 24 hr tablet   rivaroxaban (XARELTO) 20 MG TABS tablet   simvastatin (ZOCOR) 40 MG tablet   Other Relevant Orders   Microalbumin, Urine Waived (Completed)   Comprehensive metabolic panel   CBC with Differential/Platelet   TSH   Urinalysis, Routine w reflex microscopic (Completed)   DVT (deep venous thrombosis)    Continue eliquis. Continue to monitor. Call with any concerns. Labs drawn today.      Relevant Medications   amLODipine (NORVASC) 2.5 MG tablet   lisinopril (ZESTRIL) 30 MG tablet   metoprolol succinate (TOPROL-XL) 25 MG 24 hr tablet   rivaroxaban (XARELTO) 20 MG TABS tablet   simvastatin (ZOCOR) 40 MG tablet   Atrial fibrillation    Continue eliquis. Continue to monitor. Call with any concerns. Labs drawn today.      Relevant Medications   amLODipine (NORVASC) 2.5 MG tablet   lisinopril (ZESTRIL) 30 MG tablet   metoprolol succinate (TOPROL-XL) 25 MG 24 hr tablet   rivaroxaban (XARELTO) 20 MG TABS tablet   simvastatin (ZOCOR) 40 MG tablet   Other Relevant Orders   Comprehensive metabolic panel   CBC with Differential/Platelet     Nervous and Auditory   Parkinson's disease    Stable. Continue to follow with neurology. Continue to monitor. Call with any concerns.       Relevant Orders   Comprehensive metabolic panel   CBC with Differential/Platelet   Mild neurocognitive disorder due to Parkinson's disease    Stable. Continue to follow with neurology. Continue to monitor. Call with any concerns.         Genitourinary   BPH (benign prostatic hyperplasia)    Not under good control. To see urology next month. Will increase his flomax to 0.8mg  and follow  up with them. Continue to monitor. Call with any concerns.       Relevant Medications   tamsulosin (FLOMAX) 0.4 MG CAPS capsule   Other Relevant Orders   Comprehensive metabolic panel   CBC with Differential/Platelet   PSA   Urinalysis, Routine w reflex microscopic (Completed)     Hematopoietic and Hemostatic   Activated protein C resistance    Continue eliquis. Continue to monitor. Call with any concerns. Labs drawn today.        Other   Hypercholesterolemia    Under good control on current regimen. Continue current regimen. Continue to monitor. Call with any concerns. Refills given. Labs drawn today.        Relevant Medications   amLODipine (NORVASC) 2.5 MG tablet   lisinopril (ZESTRIL) 30 MG tablet   metoprolol succinate (TOPROL-XL) 25 MG 24 hr tablet   rivaroxaban (XARELTO) 20 MG TABS tablet   simvastatin (ZOCOR) 40 MG tablet   Other Relevant Orders   Comprehensive metabolic panel   CBC with Differential/Platelet   Lipid Panel w/o Chol/HDL Ratio   Major depressive disorder    Stable. Continue to monitor. Call with any concerns.       Relevant Medications   traZODone (DESYREL) 50 MG tablet   Other Relevant Orders   Comprehensive metabolic panel   CBC with Differential/Platelet   Other Visit Diagnoses     Routine general medical examination at a health care facility    -  Primary  Vaccines up to date. Screening labs checked today. Colonoscopy due- but unsure if he wants to do it. Continue diet and exercise. Call with any concerns.        LABORATORY TESTING:  Health maintenance labs ordered today as discussed above.   The natural history of prostate cancer and ongoing controversy regarding screening and potential treatment outcomes of prostate cancer has been discussed with the patient. The meaning of a false positive PSA and a false negative PSA has been discussed. He indicates understanding of the limitations of this screening test and wishes to proceed with  screening PSA testing.   IMMUNIZATIONS:   - Tdap: Tetanus vaccination status reviewed: last tetanus booster within 10 years. - Influenza: Up to date - Pneumovax: Up to date - Prevnar: Up to date - COVID: Up to date - HPV: Not applicable - Shingrix vaccine: Refused  SCREENING: - Colonoscopy:  Will think about   Discussed with patient purpose of the colonoscopy is to detect colon cancer at curable precancerous or early stages   PATIENT COUNSELING:    Sexuality: Discussed sexually transmitted diseases, partner selection, use of condoms, avoidance of unintended pregnancy  and contraceptive alternatives.   Advised to avoid cigarette smoking.  I discussed with the patient that most people either abstain from alcohol or drink within safe limits (<=14/week and <=4 drinks/occasion for males, <=7/weeks and <= 3 drinks/occasion for females) and that the risk for alcohol disorders and other health effects rises proportionally with the number of drinks per week and how often a drinker exceeds daily limits.  Discussed cessation/primary prevention of drug use and availability of treatment for abuse.   Diet: Encouraged to adjust caloric intake to maintain  or achieve ideal body weight, to reduce intake of dietary saturated fat and total fat, to limit sodium intake by avoiding high sodium foods and not adding table salt, and to maintain adequate dietary potassium and calcium preferably from fresh fruits, vegetables, and low-fat dairy products.    stressed the importance of regular exercise  Injury prevention: Discussed safety belts, safety helmets, smoke detector, smoking near bedding or upholstery.   Dental health: Discussed importance of regular tooth brushing, flossing, and dental visits.   Follow up plan: NEXT PREVENTATIVE PHYSICAL DUE IN 1 YEAR. Return in about 6 months (around 01/27/2023).

## 2022-07-28 NOTE — Assessment & Plan Note (Signed)
Continue eliquis. Continue to monitor. Call with any concerns. Labs drawn today. 

## 2022-07-28 NOTE — Assessment & Plan Note (Signed)
Stable. Continue to monitor. Call with any concerns.  ?

## 2022-07-28 NOTE — Assessment & Plan Note (Signed)
Continue eliquis. Continue to monitor. Call with any concerns. Labs drawn today.

## 2022-07-28 NOTE — Assessment & Plan Note (Signed)
Stable. Continue to follow with neurology. Continue to monitor. Call with any concerns.  

## 2022-07-28 NOTE — Assessment & Plan Note (Signed)
Running a little high. Will increase his flomax. Continue to monitor. Call with any concerns. Labs drawn today.

## 2022-07-28 NOTE — Assessment & Plan Note (Signed)
Under good control on current regimen. Continue current regimen. Continue to monitor. Call with any concerns. Refills given. Labs drawn today.   

## 2022-07-28 NOTE — Assessment & Plan Note (Signed)
Not under good control. To see urology next month. Will increase his flomax to 0.8mg  and follow up with them. Continue to monitor. Call with any concerns.

## 2022-07-29 DIAGNOSIS — H43813 Vitreous degeneration, bilateral: Secondary | ICD-10-CM | POA: Diagnosis not present

## 2022-07-29 DIAGNOSIS — H26493 Other secondary cataract, bilateral: Secondary | ICD-10-CM | POA: Diagnosis not present

## 2022-07-29 DIAGNOSIS — H35372 Puckering of macula, left eye: Secondary | ICD-10-CM | POA: Diagnosis not present

## 2022-07-29 LAB — CBC WITH DIFFERENTIAL/PLATELET
Basophils Absolute: 0 10*3/uL (ref 0.0–0.2)
Basos: 1 %
EOS (ABSOLUTE): 0.1 10*3/uL (ref 0.0–0.4)
Eos: 2 %
Hematocrit: 42.6 % (ref 37.5–51.0)
Hemoglobin: 13.9 g/dL (ref 13.0–17.7)
Immature Grans (Abs): 0 10*3/uL (ref 0.0–0.1)
Immature Granulocytes: 0 %
Lymphocytes Absolute: 1 10*3/uL (ref 0.7–3.1)
Lymphs: 22 %
MCH: 31 pg (ref 26.6–33.0)
MCHC: 32.6 g/dL (ref 31.5–35.7)
MCV: 95 fL (ref 79–97)
Monocytes Absolute: 0.3 10*3/uL (ref 0.1–0.9)
Monocytes: 7 %
Neutrophils Absolute: 3.1 10*3/uL (ref 1.4–7.0)
Neutrophils: 68 %
Platelets: 157 10*3/uL (ref 150–450)
RBC: 4.49 x10E6/uL (ref 4.14–5.80)
RDW: 12.3 % (ref 11.6–15.4)
WBC: 4.6 10*3/uL (ref 3.4–10.8)

## 2022-07-29 LAB — COMPREHENSIVE METABOLIC PANEL
ALT: 6 IU/L (ref 0–44)
AST: 10 IU/L (ref 0–40)
Albumin/Globulin Ratio: 1.6 (ref 1.2–2.2)
Albumin: 4.2 g/dL (ref 3.8–4.8)
Alkaline Phosphatase: 96 IU/L (ref 44–121)
BUN/Creatinine Ratio: 16 (ref 10–24)
BUN: 17 mg/dL (ref 8–27)
Bilirubin Total: 0.6 mg/dL (ref 0.0–1.2)
CO2: 22 mmol/L (ref 20–29)
Calcium: 9 mg/dL (ref 8.6–10.2)
Chloride: 103 mmol/L (ref 96–106)
Creatinine, Ser: 1.07 mg/dL (ref 0.76–1.27)
Globulin, Total: 2.6 g/dL (ref 1.5–4.5)
Glucose: 97 mg/dL (ref 70–99)
Potassium: 4.3 mmol/L (ref 3.5–5.2)
Sodium: 138 mmol/L (ref 134–144)
Total Protein: 6.8 g/dL (ref 6.0–8.5)
eGFR: 73 mL/min/{1.73_m2} (ref >59)

## 2022-07-29 LAB — LIPID PANEL W/O CHOL/HDL RATIO
Cholesterol, Total: 131 mg/dL (ref 100–199)
HDL: 58 mg/dL (ref >39)
LDL Chol Calc (NIH): 62 mg/dL (ref 0–99)
Triglycerides: 46 mg/dL (ref 0–149)
VLDL Cholesterol Cal: 11 mg/dL (ref 5–40)

## 2022-07-29 LAB — TSH: TSH: 0.894 u[IU]/mL (ref 0.450–4.500)

## 2022-07-29 LAB — PSA: Prostate Specific Ag, Serum: 1.9 ng/mL (ref 0.0–4.0)

## 2022-08-28 ENCOUNTER — Telehealth: Payer: Self-pay | Admitting: Anesthesiology

## 2022-08-28 NOTE — Telephone Encounter (Signed)
Pt's wife called stating she would like to ask Dr Tat about a Dr Aileen Fass, he is going to have a conference in [place] on 09/17/22, where he will talk about pain management. States is this something that pt can benefit from.

## 2022-08-29 NOTE — Telephone Encounter (Signed)
Called and gave Dr. Tats recommendations  

## 2022-09-01 ENCOUNTER — Ambulatory Visit: Payer: Medicare HMO | Admitting: Urology

## 2022-09-01 ENCOUNTER — Encounter: Payer: Self-pay | Admitting: Urology

## 2022-09-01 VITALS — BP 112/68 | HR 61 | Ht 69.0 in | Wt 230.0 lb

## 2022-09-01 DIAGNOSIS — N3941 Urge incontinence: Secondary | ICD-10-CM | POA: Diagnosis not present

## 2022-09-01 DIAGNOSIS — R350 Frequency of micturition: Secondary | ICD-10-CM | POA: Diagnosis not present

## 2022-09-01 DIAGNOSIS — G20C Parkinsonism, unspecified: Secondary | ICD-10-CM

## 2022-09-01 DIAGNOSIS — R32 Unspecified urinary incontinence: Secondary | ICD-10-CM | POA: Diagnosis not present

## 2022-09-01 LAB — URINALYSIS, COMPLETE
Bilirubin, UA: NEGATIVE
Glucose, UA: NEGATIVE
Leukocytes,UA: NEGATIVE
Nitrite, UA: NEGATIVE
Protein,UA: NEGATIVE
RBC, UA: NEGATIVE
Specific Gravity, UA: >1.03 — ABNORMAL HIGH (ref 1.005–1.030)
Urobilinogen, Ur: 0.2 mg/dL (ref 0.2–1.0)
pH, UA: 6 (ref 5.0–7.5)

## 2022-09-01 LAB — MICROSCOPIC EXAMINATION

## 2022-09-01 MED ORDER — MIRABEGRON ER 25 MG PO TB24: 25.0000 mg | ORAL_TABLET | Freq: Every day | ORAL | 0 refills | Status: DC

## 2022-09-01 NOTE — Progress Notes (Signed)
09/01/2022 10:06 AM   Thomas [name] [birth year] [id number]  Referring provider: Dorcas Carrow, DO [address] [place],  [place] [address]  Chief Complaint  Patient presents with   Follow-up    HPI: I was consulted to assess the patient's urgency incontinence.  He had Parkinson's for approximately 13 years.  He voids every 60 to 90 minutes and gets up least 3 times a night and does not have ankle edema.  He has urge incontinence but no stress incontinence or bedwetting and does not wear a pad.  His flow is good  A few years ago he had low back surgery and 15 years ago he had a stroke.  He is prone to constipation.  He is on Flomax  He has had 1 kidney stone.  No urinary tract infections.   PMH: Past Medical History:  Diagnosis Date   Activated protein C resistance 06/28/2019   Arthritis of knee    Arthropathy of lumbar facet joint    Atrial fibrillation    BPH (benign prostatic hyperplasia) 09/10/2018   DVT (deep venous thrombosis)    L leg   Dysplastic nevus 05/15/2021   Left Superior Pectoral, mod to severe, Excised 07/16/21   Erectile dysfunction 03/01/2015   Herniated lumbar disc without myelopathy 10/11/2020   Hyperchloremia    Hypercholesterolemia    Hypertension    Inability to walk 02/22/2021   Insomnia due to medical condition 09/30/2018   Major depressive disorder 01/22/2022   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   Nephrolithiasis    Osteoarthritis    Parkinson's disease    Plantar wart    Pneumonia    Post-operative nausea and vomiting 09/20/2020   Pulmonary embolism    Sciatica    Seasonal allergies    TIA (transient ischemic attack)     Surgical History: Past Surgical History:  Procedure Laterality Date   APPENDECTOMY     CATARACT EXTRACTION Bilateral 10/2019 11/2019   COLONOSCOPY WITH PROPOFOL N/A 07/27/2019   Procedure: COLONOSCOPY WITH PROPOFOL;  Surgeon: Toney Reil, MD;  Location: [facility] ENDOSCOPY;  Service:  Endoscopy;  Laterality: N/A;   EYE SURGERY     KNEE ARTHROSCOPY WITH MENISCAL REPAIR Right 2009   KNEE SURGERY Right    LUMBAR LAMINECTOMY/DECOMPRESSION MICRODISCECTOMY Left 10/11/2020   Procedure: Left Lumbar Three-Four, Lumbar Fou-Five  Microdiscectomy;  Surgeon: Maeola Harman, MD;  Location: [facility] OR;  Service: Neurosurgery;  Laterality: Left;   melanoma removal Left 07/16/2021   chest   T&A     TONSILLECTOMY      Home Medications:  Allergies as of 09/01/2022       Reactions   Codeine Other (See Comments)   Hallucinations   Other Nausea Only   General anesthesia         Medication List        Accurate as of Sep 01, 2022 10:06 AM. If you have any questions, ask your nurse or doctor.          acetaminophen 500 MG tablet Commonly known as: TYLENOL Take 1,000 mg by mouth daily.   amantadine 100 MG capsule Commonly known as: SYMMETREL TAKE ONE CAPSULE BY MOUTH THREE TIMES A DAY   amLODipine 2.5 MG tablet Commonly known as: NORVASC TAKE 1 TABLET (2.5 MG TOTAL) BY MOUTH DAILY AS NEEDED (BLOOD PRESSURE GREATER THAN 160/90). TAKE IN THE MORNING   calcium carbonate 500 MG chewable tablet Commonly known as: TUMS - dosed in mg elemental calcium  Chew 1 tablet by mouth daily as needed for indigestion or heartburn.   carbidopa-levodopa 25-100 MG tablet Commonly known as: SINEMET IR TAKE TWO TABLETS BY MOUTH FOUR TIMES A DAY AT 6AM, 10AM, 2:00PM AND 6PM   carbidopa-levodopa 50-200 MG tablet Commonly known as: SINEMET CR TAKE 1 TABLET BY MOUTH AT BEDTIME   cetirizine 10 MG tablet Commonly known as: ZYRTEC Take 10 mg by mouth at bedtime.   cholecalciferol 25 MCG (1000 UNIT) tablet Commonly known as: VITAMIN D3 Take 1,000 Units by mouth daily.   diazepam 5 MG tablet Commonly known as: VALIUM Take 5 mg by mouth 2 (two) times daily as needed.   diclofenac Sodium 1 % Gel Commonly known as: VOLTAREN APPLY TOPICALLY 4 GRAMS FOUR TIMES DAILY What changed: See the new  instructions.   hydrALAZINE 25 MG tablet Commonly known as: APRESOLINE Take 1 tablet (25 mg total) by mouth 3 (three) times daily as needed (for SBP >160). Takes in the evening   HYDROcodone-acetaminophen 5-325 MG tablet Commonly known as: NORCO/VICODIN Take by mouth.   Inbrija 42 MG Caps Generic drug: Levodopa Samples of this drug were given to the patient, quantity 1 box of 32 capsules, Lot Number [id number] Exp: 03/2023  Patient has been educated on dosage and uses of medication.   Inbrija 42 MG Caps Generic drug: Levodopa We are going to start inbrija.  Remember that TWO capsules is ONE dosage (never inhale just one capsule).  You can inhale the capsules as needed up to 5 times per day, separated by 2 hour intervals.  Many patients use this right when the wake up to help with first morning "on" and then as needed during the day.  You should take a sip of water prior to using the inhaler to avoid side effects.  It may generate some cough right when you use it and that is normal.  There are nurse educators available to help you with this device.  You can call [phone] and they will set you up with a nurse educator to assist you for free of charge.  They are available 8am-8pm Monday-Friday.   Inbrija 42 MG Caps Generic drug: Levodopa Samples of this drug were given to the patient, quantity 1, Lot Number [id number] Exp 12-24   lisinopril 30 MG tablet Commonly known as: ZESTRIL Take 1 tablet (30 mg total) by mouth daily.   melatonin 5 MG Tabs Take 5 mg by mouth at bedtime.   methocarbamol 500 MG tablet Commonly known as: ROBAXIN Take 1 tablet (500 mg total) by mouth every 6 (six) hours as needed for muscle spasms.   metoprolol succinate 25 MG 24 hr tablet Commonly known as: TOPROL-XL Take 1 tablet (25 mg total) by mouth at bedtime.   multivitamin tablet Take 1 tablet by mouth daily at 6 PM.   Pen Needles 30G X 5 MM Misc 1 each by Does not apply route daily.    polyethylene glycol powder 17 GM/SCOOP powder Commonly known as: GLYCOLAX/MIRALAX Take 17 g by mouth 2 (two) times daily as needed. What changed: when to take this   PreviDent 5000 Booster Plus 1.1 % Pste Generic drug: Sodium Fluoride Place 1 application onto teeth 2 (two) times daily.   rivaroxaban 20 MG Tabs tablet Commonly known as: Xarelto Take 1 tablet (20 mg total) by mouth daily.   rOPINIRole 3 MG tablet Commonly known as: REQUIP TAKE ONE TABLET BY MOUTH THREE TIMES A DAY   simvastatin 40 MG tablet Commonly known  as: ZOCOR Take 1 tablet (40 mg total) by mouth daily.   tamsulosin 0.4 MG Caps capsule Commonly known as: FLOMAX Take 2 capsules (0.8 mg total) by mouth daily.   traZODone 50 MG tablet Commonly known as: DESYREL Take 1 tablet (50 mg total) by mouth at bedtime.        Allergies:  Allergies  Allergen Reactions   Codeine Other (See Comments)    Hallucinations   Other Nausea Only    General anesthesia     Family History: Family History  Problem Relation Age of Onset   Parkinson's disease Father    Coronary artery disease Mother    Heart attack Sister    Deep vein thrombosis Sister     Social History:  reports that he has never smoked. He has never used smokeless tobacco. He reports that he does not drink alcohol and does not use drugs.  ROS:                                        Physical Exam: BP 112/68   Pulse 61   Ht 5\' 9"  (1.753 m)   Wt 104.3 kg   BMI 33.97 kg/m   Constitutional:  Alert and oriented, No acute distress. HEENT: [place] AT, moist mucus membranes.  Trachea midline, no masses. Cardiovascular: No clubbing, cyanosis, or edema. Respiratory: Normal respiratory effort, no increased work of breathing. GI: Abdomen is soft, nontender, nondistended, no abdominal masses GU: Male genitalia normal.  50 g prostate Skin: No rashes, bruises or suspicious lesions. Lymph: No cervical or inguinal  adenopathy. Neurologic: Grossly intact, no focal deficits, moving all 4 extremities. Psychiatric: Normal mood and affect.  Laboratory Data: Lab Results  Component Value Date   WBC 4.6 07/28/2022   HGB 13.9 07/28/2022   HCT 42.6 07/28/2022   MCV 95 07/28/2022   PLT 157 07/28/2022    Lab Results  Component Value Date   CREATININE 1.07 07/28/2022    No results found for: "PSA"  No results found for: "TESTOSTERONE"  Lab Results  Component Value Date   HGBA1C 5.4 09/18/2020    Urinalysis    Component Value Date/Time   APPEARANCEUR Clear 07/28/2022 1047   GLUCOSEU Negative 07/28/2022 1047   BILIRUBINUR Negative 07/28/2022 1047   PROTEINUR Negative 07/28/2022 1047   NITRITE Negative 07/28/2022 1047   LEUKOCYTESUR Negative 07/28/2022 1047    Pertinent Imaging: Urine reviewed and sent for culture.  Postvoid residual 0 mL  Assessment & Plan: Patient has Parkinson's with frequency and urge incontinence.  I do not think he needs cystoscopy at this stage or urodynamics but may need 1 or both in the future.  I will see him back on Flomax in combination with Myrbetriq 25 mg samples and a prescription and keep an eye on his urine volume.  Rare risk of retention discussed.  I did mention that further testing may be needed in the future depending on how he does with medical therapy  1. Urinary incontinence, unspecified type  - Urinalysis, Complete   No follow-ups on file.  Martina Sinner, MD  [facility] [address], [address] [place], [place] [address] [phone]

## 2022-09-06 ENCOUNTER — Other Ambulatory Visit: Payer: Self-pay | Admitting: Neurology

## 2022-09-06 DIAGNOSIS — G20A1 Parkinson's disease without dyskinesia, without mention of fluctuations: Secondary | ICD-10-CM

## 2022-09-09 ENCOUNTER — Ambulatory Visit: Payer: Medicare HMO | Admitting: Neurology

## 2022-09-15 ENCOUNTER — Telehealth: Payer: Self-pay | Admitting: *Deleted

## 2022-09-15 NOTE — Telephone Encounter (Signed)
Wife and patient calling stating that the Thomas [name] is not working. I explained he has only been taking this for only 2 weeks (started 09/01/22) and it takes at least 4 weeks to see any changes. Wife and pt asking for something quicker. Please advise

## 2022-09-19 ENCOUNTER — Ambulatory Visit: Payer: Medicare HMO | Admitting: Neurology

## 2022-09-22 NOTE — Telephone Encounter (Signed)
Patient wife called in today and states the medication Myrbetriq is not working . They would like something else called in to help.

## 2022-09-24 ENCOUNTER — Telehealth: Payer: Self-pay

## 2022-09-24 MED ORDER — OXYBUTYNIN CHLORIDE ER 10 MG PO TB24: 10.0000 mg | ORAL_TABLET | Freq: Every day | ORAL | 11 refills | Status: DC

## 2022-09-24 NOTE — Addendum Note (Signed)
Addended by: Marchelle Folks on: 09/24/2022 11:21 AM   Modules accepted: Orders

## 2022-09-24 NOTE — Telephone Encounter (Signed)
Called left on triage line- 10:20 am  Pt's- wife Neysa Bonito) is requesting an update on her last message.   Pt aware per Macdarmid Oxybutynin 10 mg can be erxed.   Confirmed Public house manager.   Med erxed.

## 2022-09-29 NOTE — Telephone Encounter (Signed)
Patients wife is reaching out regarding physical therapy notes from Dr. Arbutus Leas to be sent to Dr. Ollen Barges and would to be notified when it is done. / KB    [phone]

## 2022-09-29 NOTE — Telephone Encounter (Signed)
Called patients wife and let her know that any PT notes will need to be sent by the PT office it was performed at

## 2022-10-01 ENCOUNTER — Other Ambulatory Visit: Payer: Self-pay | Admitting: Family Medicine

## 2022-10-01 NOTE — Telephone Encounter (Signed)
Requested Prescriptions  Pending Prescriptions Disp Refills   XARELTO 20 MG TABS tablet [Pharmacy Med Name: XARELTO 20 MG TABLET] 90 tablet 1    Sig: TAKE 1 TABLET BY MOUTH EVERY DAY     Hematology: Anticoagulants - rivaroxaban Passed - 10/01/2022  4:11 AM      Passed - ALT in normal range and within 360 days    ALT  Date Value Ref Range Status  07/28/2022 6 0 - 44 IU/L Final         Passed - AST in normal range and within 360 days    AST  Date Value Ref Range Status  07/28/2022 10 0 - 40 IU/L Final         Passed - Cr in normal range and within 360 days    Creatinine, Ser  Date Value Ref Range Status  07/28/2022 1.07 0.76 - 1.27 mg/dL Final         Passed - HCT in normal range and within 360 days    Hematocrit  Date Value Ref Range Status  07/28/2022 42.6 37.5 - 51.0 % Final         Passed - HGB in normal range and within 360 days    Hemoglobin  Date Value Ref Range Status  07/28/2022 13.9 13.0 - 17.7 g/dL Final         Passed - PLT in normal range and within 360 days    Platelets  Date Value Ref Range Status  07/28/2022 157 150 - 450 x10E3/uL Final         Passed - eGFR is 15 or above and within 360 days    GFR calc Af Amer  Date Value Ref Range Status  12/27/2019 91 >59 mL/min/1.73 Final    Comment:    **[facility] currently reports eGFR in compliance with the current**   recommendations of the [employer]. [facility] will   update reporting as new guidelines are published from the NKF-ASN   Task force.    GFR calc non Af Amer  Date Value Ref Range Status  12/27/2019 79 >59 mL/min/1.73 Final   eGFR  Date Value Ref Range Status  07/28/2022 73 >59 mL/min/1.73 Final         Passed - Patient is not pregnant      Passed - Valid encounter within last 12 months    Recent Outpatient Visits           2 months ago Routine general medical examination at a health care facility   [facility], Megan P, DO   8 months  ago Need for influenza vaccination   [place] [facility] [place], [place] P, DO   1 year ago Upper respiratory tract infection, unspecified type   [place] [facility] [place], [place] P, DO   1 year ago Routine general medical examination at a health care facility   [facility] [place], [place] P, DO   1 year ago Inability to walk   [facility] Dorcas Carrow, DO       Future Appointments             In 2 weeks MacDiarmid, Lorin Picket, MD [facility]   In 4 months Dorcas Carrow, DO [facility]   In 7 months Deirdre Evener, MD [facility]

## 2022-10-10 DIAGNOSIS — M5416 Radiculopathy, lumbar region: Secondary | ICD-10-CM | POA: Diagnosis not present

## 2022-10-10 DIAGNOSIS — M5126 Other intervertebral disc displacement, lumbar region: Secondary | ICD-10-CM | POA: Diagnosis not present

## 2022-10-10 DIAGNOSIS — M48062 Spinal stenosis, lumbar region with neurogenic claudication: Secondary | ICD-10-CM | POA: Diagnosis not present

## 2022-10-11 ENCOUNTER — Other Ambulatory Visit: Payer: Self-pay | Admitting: Neurology

## 2022-10-11 DIAGNOSIS — G20A1 Parkinson's disease without dyskinesia, without mention of fluctuations: Secondary | ICD-10-CM

## 2022-10-15 ENCOUNTER — Ambulatory Visit: Payer: Medicare HMO | Admitting: Neurology

## 2022-10-15 ENCOUNTER — Other Ambulatory Visit: Payer: Self-pay

## 2022-10-15 ENCOUNTER — Inpatient Hospital Stay: Payer: Medicare HMO

## 2022-10-15 ENCOUNTER — Encounter
Admission: EM | Disposition: A | Discharge status: Home Health | Payer: Self-pay | Source: Home / Self Care | Attending: Internal Medicine

## 2022-10-15 ENCOUNTER — Ambulatory Visit: Payer: Self-pay

## 2022-10-15 ENCOUNTER — Inpatient Hospital Stay: Payer: Medicare HMO | Admitting: Anesthesiology

## 2022-10-15 ENCOUNTER — Encounter: Payer: Self-pay | Admitting: Internal Medicine

## 2022-10-15 ENCOUNTER — Emergency Department: Payer: Medicare HMO

## 2022-10-15 ENCOUNTER — Inpatient Hospital Stay
Admission: EM | Admit: 2022-10-15 | Discharge: 2022-10-21 | DRG: 330 | Disposition: A | Discharge status: Home Health | Payer: Medicare HMO | Attending: Internal Medicine | Admitting: Internal Medicine

## 2022-10-15 DIAGNOSIS — F0670 Mild neurocognitive disorder due to known physiological condition without behavioral disturbance: Secondary | ICD-10-CM | POA: Diagnosis present

## 2022-10-15 DIAGNOSIS — J302 Other seasonal allergic rhinitis: Secondary | ICD-10-CM | POA: Diagnosis present

## 2022-10-15 DIAGNOSIS — R001 Bradycardia, unspecified: Secondary | ICD-10-CM | POA: Diagnosis present

## 2022-10-15 DIAGNOSIS — E669 Obesity, unspecified: Secondary | ICD-10-CM | POA: Diagnosis not present

## 2022-10-15 DIAGNOSIS — I10 Essential (primary) hypertension: Secondary | ICD-10-CM

## 2022-10-15 DIAGNOSIS — I82502 Chronic embolism and thrombosis of unspecified deep veins of left lower extremity: Secondary | ICD-10-CM | POA: Diagnosis not present

## 2022-10-15 DIAGNOSIS — I480 Paroxysmal atrial fibrillation: Secondary | ICD-10-CM

## 2022-10-15 DIAGNOSIS — Z7901 Long term (current) use of anticoagulants: Secondary | ICD-10-CM

## 2022-10-15 DIAGNOSIS — Z79891 Long term (current) use of opiate analgesic: Secondary | ICD-10-CM

## 2022-10-15 DIAGNOSIS — K562 Volvulus: Secondary | ICD-10-CM | POA: Diagnosis not present

## 2022-10-15 DIAGNOSIS — E7800 Pure hypercholesterolemia, unspecified: Secondary | ICD-10-CM | POA: Diagnosis present

## 2022-10-15 DIAGNOSIS — E876 Hypokalemia: Secondary | ICD-10-CM | POA: Diagnosis present

## 2022-10-15 DIAGNOSIS — G20A1 Parkinson's disease without dyskinesia, without mention of fluctuations: Secondary | ICD-10-CM

## 2022-10-15 DIAGNOSIS — Z86711 Personal history of pulmonary embolism: Secondary | ICD-10-CM | POA: Diagnosis not present

## 2022-10-15 DIAGNOSIS — Q438 Other specified congenital malformations of intestine: Secondary | ICD-10-CM

## 2022-10-15 DIAGNOSIS — M549 Dorsalgia, unspecified: Secondary | ICD-10-CM | POA: Diagnosis present

## 2022-10-15 DIAGNOSIS — I4891 Unspecified atrial fibrillation: Secondary | ICD-10-CM | POA: Diagnosis present

## 2022-10-15 DIAGNOSIS — Z6833 Body mass index (BMI) 33.0-33.9, adult: Secondary | ICD-10-CM

## 2022-10-15 DIAGNOSIS — Z86718 Personal history of other venous thrombosis and embolism: Secondary | ICD-10-CM | POA: Diagnosis not present

## 2022-10-15 DIAGNOSIS — Z820 Family history of epilepsy and other diseases of the nervous system: Secondary | ICD-10-CM

## 2022-10-15 DIAGNOSIS — D6851 Activated protein C resistance: Secondary | ICD-10-CM | POA: Diagnosis present

## 2022-10-15 DIAGNOSIS — I82409 Acute embolism and thrombosis of unspecified deep veins of unspecified lower extremity: Secondary | ICD-10-CM | POA: Diagnosis present

## 2022-10-15 DIAGNOSIS — Z8249 Family history of ischemic heart disease and other diseases of the circulatory system: Secondary | ICD-10-CM | POA: Diagnosis not present

## 2022-10-15 DIAGNOSIS — Z885 Allergy status to narcotic agent status: Secondary | ICD-10-CM

## 2022-10-15 DIAGNOSIS — R5381 Other malaise: Secondary | ICD-10-CM | POA: Diagnosis present

## 2022-10-15 DIAGNOSIS — Z8673 Personal history of transient ischemic attack (TIA), and cerebral infarction without residual deficits: Secondary | ICD-10-CM

## 2022-10-15 DIAGNOSIS — Z79899 Other long term (current) drug therapy: Secondary | ICD-10-CM

## 2022-10-15 DIAGNOSIS — R140 Abdominal distension (gaseous): Secondary | ICD-10-CM | POA: Diagnosis not present

## 2022-10-15 DIAGNOSIS — R1032 Left lower quadrant pain: Secondary | ICD-10-CM | POA: Diagnosis not present

## 2022-10-15 DIAGNOSIS — Z85820 Personal history of malignant melanoma of skin: Secondary | ICD-10-CM | POA: Diagnosis not present

## 2022-10-15 DIAGNOSIS — N281 Cyst of kidney, acquired: Secondary | ICD-10-CM | POA: Diagnosis not present

## 2022-10-15 DIAGNOSIS — K6389 Other specified diseases of intestine: Secondary | ICD-10-CM | POA: Diagnosis not present

## 2022-10-15 DIAGNOSIS — N400 Enlarged prostate without lower urinary tract symptoms: Secondary | ICD-10-CM | POA: Diagnosis present

## 2022-10-15 DIAGNOSIS — R109 Unspecified abdominal pain: Secondary | ICD-10-CM | POA: Diagnosis not present

## 2022-10-15 DIAGNOSIS — Z888 Allergy status to other drugs, medicaments and biological substances status: Secondary | ICD-10-CM

## 2022-10-15 DIAGNOSIS — R197 Diarrhea, unspecified: Secondary | ICD-10-CM | POA: Diagnosis not present

## 2022-10-15 HISTORY — PX: FLEXIBLE SIGMOIDOSCOPY: SHX5431

## 2022-10-15 HISTORY — PX: BOWEL DECOMPRESSION: SHX5532

## 2022-10-15 LAB — COMPREHENSIVE METABOLIC PANEL
ALT: 6 U/L (ref 0–44)
AST: 14 U/L — ABNORMAL LOW (ref 15–41)
Albumin: 4.2 g/dL (ref 3.5–5.0)
Alkaline Phosphatase: 79 U/L (ref 38–126)
Anion gap: 10 (ref 5–15)
BUN: 23 mg/dL (ref 8–23)
CO2: 25 mmol/L (ref 22–32)
Calcium: 9 mg/dL (ref 8.9–10.3)
Chloride: 105 mmol/L (ref 98–111)
Creatinine, Ser: 1.09 mg/dL (ref 0.61–1.24)
GFR, Estimated: >60 mL/min (ref >60)
Glucose, Bld: 111 mg/dL — ABNORMAL HIGH (ref 70–99)
Potassium: 3.8 mmol/L (ref 3.5–5.1)
Sodium: 140 mmol/L (ref 135–145)
Total Bilirubin: 1 mg/dL (ref 0.3–1.2)
Total Protein: 7 g/dL (ref 6.5–8.1)

## 2022-10-15 LAB — LACTIC ACID, PLASMA
Lactic Acid, Venous: 0.9 mmol/L (ref 0.5–1.9)
Lactic Acid, Venous: 1.2 mmol/L (ref 0.5–1.9)

## 2022-10-15 LAB — URINALYSIS, ROUTINE W REFLEX MICROSCOPIC
Bilirubin Urine: NEGATIVE
Glucose, UA: NEGATIVE mg/dL
Hgb urine dipstick: NEGATIVE
Ketones, ur: NEGATIVE mg/dL
Leukocytes,Ua: NEGATIVE
Nitrite: NEGATIVE
Protein, ur: NEGATIVE mg/dL
Specific Gravity, Urine: 1.009 (ref 1.005–1.030)
pH: 7 (ref 5.0–8.0)

## 2022-10-15 LAB — CBC
HCT: 43.4 % (ref 39.0–52.0)
Hemoglobin: 14.2 g/dL (ref 13.0–17.0)
MCH: 30.3 pg (ref 26.0–34.0)
MCHC: 32.7 g/dL (ref 30.0–36.0)
MCV: 92.5 fL (ref 80.0–100.0)
Platelets: 175 10*3/uL (ref 150–400)
RBC: 4.69 MIL/uL (ref 4.22–5.81)
RDW: 12 % (ref 11.5–15.5)
WBC: 7.2 10*3/uL (ref 4.0–10.5)
nRBC: 0 % (ref 0.0–0.2)

## 2022-10-15 LAB — PROTIME-INR
INR: 1.4 — ABNORMAL HIGH (ref 0.8–1.2)
Prothrombin Time: 17 seconds — ABNORMAL HIGH (ref 11.4–15.2)

## 2022-10-15 LAB — HEPARIN LEVEL (UNFRACTIONATED): Heparin Unfractionated: >1.1 IU/mL — ABNORMAL HIGH (ref 0.30–0.70)

## 2022-10-15 LAB — LIPASE, BLOOD: Lipase: 43 U/L (ref 11–51)

## 2022-10-15 LAB — APTT: aPTT: 27 seconds (ref 24–36)

## 2022-10-15 LAB — HM COLONOSCOPY

## 2022-10-15 SURGERY — SIGMOIDOSCOPY, FLEXIBLE
Anesthesia: General

## 2022-10-15 MED ORDER — HYDRALAZINE HCL 50 MG PO TABS: 25.0000 mg | ORAL_TABLET | Freq: Three times a day (TID) | ORAL | Status: DC | PRN

## 2022-10-15 MED ORDER — SIMVASTATIN 20 MG PO TABS
40.0000 mg | ORAL_TABLET | Freq: Every day | ORAL | Status: DC
  Administered 2022-10-16 – 2022-10-20 (×5): 40 mg via ORAL
  Filled 2022-10-15 (×5): qty 2

## 2022-10-15 MED ORDER — METHOCARBAMOL 500 MG PO TABS
1000.0000 mg | ORAL_TABLET | Freq: Two times a day (BID) | ORAL | Status: DC
  Administered 2022-10-15 – 2022-10-19 (×8): 1000 mg via ORAL
  Filled 2022-10-15 (×8): qty 2

## 2022-10-15 MED ORDER — ONDANSETRON HCL 4 MG/2ML IJ SOLN
4.0000 mg | Freq: Four times a day (QID) | INTRAMUSCULAR | Status: DC | PRN
  Administered 2022-10-17 – 2022-10-21 (×2): 4 mg via INTRAVENOUS
  Filled 2022-10-15: qty 2

## 2022-10-15 MED ORDER — TAMSULOSIN HCL 0.4 MG PO CAPS
0.8000 mg | ORAL_CAPSULE | Freq: Every day | ORAL | Status: DC
  Administered 2022-10-15 – 2022-10-21 (×7): 0.8 mg via ORAL
  Filled 2022-10-15 (×7): qty 2

## 2022-10-15 MED ORDER — PROPOFOL 500 MG/50ML IV EMUL
INTRAVENOUS | Status: DC | PRN
  Administered 2022-10-15: 140 ug/kg/min via INTRAVENOUS

## 2022-10-15 MED ORDER — HYDRALAZINE HCL 20 MG/ML IJ SOLN
5.0000 mg | Freq: Four times a day (QID) | INTRAMUSCULAR | Status: DC | PRN
  Administered 2022-10-15: 5 mg via INTRAVENOUS
  Filled 2022-10-15: qty 1

## 2022-10-15 MED ORDER — SODIUM CHLORIDE 0.9 % IV SOLN: INTRAVENOUS | Status: DC

## 2022-10-15 MED ORDER — BISACODYL 5 MG PO TBEC
20.0000 mg | DELAYED_RELEASE_TABLET | Freq: Once | ORAL | Status: AC
  Administered 2022-10-16: 20 mg via ORAL
  Filled 2022-10-15: qty 4

## 2022-10-15 MED ORDER — AMANTADINE HCL 100 MG PO CAPS
100.0000 mg | ORAL_CAPSULE | Freq: Three times a day (TID) | ORAL | Status: DC
  Administered 2022-10-15 – 2022-10-21 (×19): 100 mg via ORAL
  Filled 2022-10-15 (×20): qty 1

## 2022-10-15 MED ORDER — PROPOFOL 10 MG/ML IV BOLUS
INTRAVENOUS | Status: AC
  Filled 2022-10-15: qty 20

## 2022-10-15 MED ORDER — GLYCOPYRROLATE 0.2 MG/ML IJ SOLN
INTRAMUSCULAR | Status: DC | PRN
  Administered 2022-10-15: .2 mg via INTRAVENOUS

## 2022-10-15 MED ORDER — ROPINIROLE HCL 1 MG PO TABS
3.0000 mg | ORAL_TABLET | Freq: Three times a day (TID) | ORAL | Status: DC
  Administered 2022-10-16 – 2022-10-21 (×17): 3 mg via ORAL
  Filled 2022-10-15 (×18): qty 3

## 2022-10-15 MED ORDER — SODIUM CHLORIDE 0.9 % IV BOLUS
500.0000 mL | Freq: Once | INTRAVENOUS | Status: AC
  Administered 2022-10-15: 500 mL via INTRAVENOUS

## 2022-10-15 MED ORDER — AMLODIPINE BESYLATE 5 MG PO TABS
2.5000 mg | ORAL_TABLET | Freq: Every day | ORAL | Status: DC
  Administered 2022-10-15 – 2022-10-20 (×6): 2.5 mg via ORAL
  Filled 2022-10-15 (×6): qty 1

## 2022-10-15 MED ORDER — METRONIDAZOLE 500 MG PO TABS
1000.0000 mg | ORAL_TABLET | Freq: Three times a day (TID) | ORAL | Status: AC
  Administered 2022-10-16 (×3): 1000 mg via ORAL
  Filled 2022-10-15 (×3): qty 2

## 2022-10-15 MED ORDER — PROPOFOL 10 MG/ML IV BOLUS
INTRAVENOUS | Status: AC
  Filled 2022-10-15: qty 40

## 2022-10-15 MED ORDER — GLYCOPYRROLATE 0.2 MG/ML IJ SOLN
INTRAMUSCULAR | Status: AC
  Filled 2022-10-15: qty 1

## 2022-10-15 MED ORDER — OXYBUTYNIN CHLORIDE ER 10 MG PO TB24
10.0000 mg | ORAL_TABLET | Freq: Every day | ORAL | Status: DC
  Administered 2022-10-16 – 2022-10-21 (×6): 10 mg via ORAL
  Filled 2022-10-15 (×6): qty 1

## 2022-10-15 MED ORDER — PROPOFOL 10 MG/ML IV BOLUS
INTRAVENOUS | Status: DC | PRN
  Administered 2022-10-15: 60 mg via INTRAVENOUS
  Administered 2022-10-15 (×2): 20 mg via INTRAVENOUS

## 2022-10-15 MED ORDER — HEPARIN BOLUS VIA INFUSION
4000.0000 [IU] | Freq: Once | INTRAVENOUS | Status: AC
  Administered 2022-10-15: 4000 [IU] via INTRAVENOUS
  Filled 2022-10-15: qty 4000

## 2022-10-15 MED ORDER — NEOMYCIN SULFATE 500 MG PO TABS
1000.0000 mg | ORAL_TABLET | Freq: Three times a day (TID) | ORAL | Status: AC
  Administered 2022-10-16 (×3): 1000 mg via ORAL
  Filled 2022-10-15 (×3): qty 2

## 2022-10-15 MED ORDER — CARBIDOPA-LEVODOPA ER 50-200 MG PO TBCR
1.0000 | EXTENDED_RELEASE_TABLET | Freq: Every day | ORAL | Status: DC
  Administered 2022-10-15 – 2022-10-20 (×6): 1 via ORAL
  Filled 2022-10-15 (×6): qty 1

## 2022-10-15 MED ORDER — IOHEXOL 300 MG/ML  SOLN
100.0000 mL | Freq: Once | INTRAMUSCULAR | Status: AC | PRN
  Administered 2022-10-15: 100 mL via INTRAVENOUS

## 2022-10-15 MED ORDER — CARBIDOPA-LEVODOPA 25-100 MG PO TABS
2.0000 | ORAL_TABLET | Freq: Four times a day (QID) | ORAL | Status: DC
  Administered 2022-10-15 – 2022-10-21 (×21): 2 via ORAL
  Filled 2022-10-15 (×21): qty 2

## 2022-10-15 MED ORDER — SODIUM CHLORIDE 0.9 % IV SOLN
2.0000 g | INTRAVENOUS | Status: AC
  Administered 2022-10-17: 2 g via INTRAVENOUS

## 2022-10-15 MED ORDER — ACETAMINOPHEN 325 MG PO TABS
650.0000 mg | ORAL_TABLET | Freq: Four times a day (QID) | ORAL | Status: DC | PRN
  Administered 2022-10-18 – 2022-10-19 (×3): 650 mg via ORAL
  Filled 2022-10-15 (×3): qty 2

## 2022-10-15 MED ORDER — TRAZODONE HCL 50 MG PO TABS
50.0000 mg | ORAL_TABLET | Freq: Every day | ORAL | Status: DC
  Administered 2022-10-15 – 2022-10-20 (×6): 50 mg via ORAL
  Filled 2022-10-15 (×6): qty 1

## 2022-10-15 MED ORDER — LACTATED RINGERS IV SOLN: INTRAVENOUS | Status: AC

## 2022-10-15 MED ORDER — HEPARIN (PORCINE) 25000 UT/250ML-% IV SOLN
1250.0000 [IU]/h | INTRAVENOUS | Status: AC
  Administered 2022-10-15 – 2022-10-16 (×2): 1250 [IU]/h via INTRAVENOUS
  Filled 2022-10-15 (×4): qty 250

## 2022-10-15 MED ORDER — POLYETHYLENE GLYCOL 3350 17 GM/SCOOP PO POWD
1.0000 | Freq: Once | ORAL | Status: AC
  Administered 2022-10-16: 255 g via ORAL
  Filled 2022-10-15: qty 255

## 2022-10-15 MED ORDER — LIDOCAINE HCL (CARDIAC) PF 100 MG/5ML IV SOSY
PREFILLED_SYRINGE | INTRAVENOUS | Status: DC | PRN
  Administered 2022-10-15: 100 mg via INTRAVENOUS

## 2022-10-15 NOTE — Assessment & Plan Note (Signed)
-   Continue home Sinemet and amantadine

## 2022-10-15 NOTE — ED Notes (Signed)
ED TO INPATIENT HANDOFF REPORT  ED Nurse Name and Phone #: Kyla Balzarine [phone]  S Name/Age/Gender Thomas [name] 72 y.o. male Room/Bed: ED30A/ED30A  Code Status   Code Status: Full Code  Home/SNF/Other Home Patient oriented to: self, place, situation, and time Is this baseline? Yes   Triage Complete: Triage complete  Chief Complaint Sigmoid volvulus (HCC) [K56.2]  Triage Note Pt comes with c/o diarrhea and abdominal pain for 3 days. Pt states lower belly pain. Pt states diarrhea is clear.    Allergies Allergies  Allergen Reactions   Codeine Other (See Comments)    Hallucinations   Other Nausea Only    General anesthesia     Level of Care/Admitting Diagnosis ED Disposition     ED Disposition  Admit   Condition  --   Comment  Hospital Area: [facility] [[id number]]  Level of Care: Progressive [102]  Admit to Progressive based on following criteria: MULTISYSTEM THREATS such as stable sepsis, metabolic/electrolyte imbalance with or without encephalopathy that is responding to early treatment.  Covid Evaluation: Asymptomatic - no recent exposure (last 10 days) testing not required  Diagnosis: Sigmoid volvulus [facility]) [[id number]]  Admitting Physician: Verdene Lennert [[id number]]  Attending Physician: Verdene Lennert [phone]  Certification:: I certify this patient will need inpatient services for at least 2 midnights  Estimated Length of Stay: 3          B Medical/Surgery History Past Medical History:  Diagnosis Date   Activated protein C resistance 06/28/2019   Arthritis of knee    Arthropathy of lumbar facet joint    Atrial fibrillation    BPH (benign prostatic hyperplasia) 09/10/2018   DVT (deep venous thrombosis)    L leg   Dysplastic nevus 05/15/2021   Left Superior Pectoral, mod to severe, Excised 07/16/21   Erectile dysfunction 03/01/2015   Herniated lumbar disc without myelopathy 10/11/2020   Hyperchloremia    Hypercholesterolemia     Hypertension    Inability to walk 02/22/2021   Insomnia due to medical condition 09/30/2018   Major depressive disorder 01/22/2022   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   Nephrolithiasis    Osteoarthritis    Parkinson's disease    Plantar wart    Pneumonia    Post-operative nausea and vomiting 09/20/2020   Pulmonary embolism    Sciatica    Seasonal allergies    TIA (transient ischemic attack)    Past Surgical History:  Procedure Laterality Date   APPENDECTOMY     CATARACT EXTRACTION Bilateral 10/2019 11/2019   COLONOSCOPY WITH PROPOFOL N/A 07/27/2019   Procedure: COLONOSCOPY WITH PROPOFOL;  Surgeon: Toney Reil, MD;  Location: [facility] ENDOSCOPY;  Service: Endoscopy;  Laterality: N/A;   EYE SURGERY     KNEE ARTHROSCOPY WITH MENISCAL REPAIR Right 2009   KNEE SURGERY Right    LUMBAR LAMINECTOMY/DECOMPRESSION MICRODISCECTOMY Left 10/11/2020   Procedure: Left Lumbar Three-Four, Lumbar Fou-Five  Microdiscectomy;  Surgeon: Maeola Harman, MD;  [facility] OR;  Service: Neurosurgery;  Laterality: Left;   melanoma removal Left 07/16/2021   chest   T&A     TONSILLECTOMY       A IV Location/Drains/Wounds Patient Lines/Drains/Airways Status     Active Line/Drains/Airways     Name Placement date Placement time Site Days   Peripheral IV 10/15/22 20 G Anterior;Proximal;Right Forearm 10/15/22  0940  Forearm  less than 1            Intake/Output Last 24 hours  Intake/Output  Summary (Last 24 hours) at 10/15/2022 1718 Last data filed at 10/15/2022 1440 Gross per 24 hour  Intake 700 ml  Output --  Net 700 ml    Labs/Imaging Results for orders placed or performed during the hospital encounter of 10/15/22 (from the past 48 hour(s))  Lipase, blood     Status: None   Collection Time: 10/15/22  9:31 AM  Result Value Ref Range   Lipase 43 11 - 51 U/L    Comment: Performed at [facility], [address] [place], [place] [address]   Comprehensive metabolic panel     Status: Abnormal   Collection Time: 10/15/22  9:31 AM  Result Value Ref Range   Sodium 140 135 - 145 mmol/L   Potassium 3.8 3.5 - 5.1 mmol/L   Chloride 105 98 - 111 mmol/L   CO2 25 22 - 32 mmol/L   Glucose, Bld 111 (H) 70 - 99 mg/dL    Comment: Glucose reference range applies only to samples taken after fasting for at least 8 hours.   BUN 23 8 - 23 mg/dL   Creatinine, Ser 6.04 0.61 - 1.24 mg/dL   Calcium 9.0 8.9 - 54.0 mg/dL   Total Protein 7.0 6.5 - 8.1 g/dL   Albumin 4.2 3.5 - 5.0 g/dL   AST 14 (L) 15 - 41 U/L   ALT 6 0 - 44 U/L   Alkaline Phosphatase 79 38 - 126 U/L   Total Bilirubin 1.0 0.3 - 1.2 mg/dL   GFR, Estimated >98 >11 mL/min    Comment: (NOTE) Calculated using the CKD-EPI Creatinine Equation (2021)    Anion gap 10 5 - 15    Comment: Performed at [facility], [address] [place], [place] [address]  CBC     Status: None   Collection Time: 10/15/22  9:31 AM  Result Value Ref Range   WBC 7.2 4.0 - 10.5 K/uL   RBC 4.69 4.22 - 5.81 MIL/uL   Hemoglobin 14.2 13.0 - 17.0 g/dL   HCT 29.5 62.1 - 30.8 %   MCV 92.5 80.0 - 100.0 fL   MCH 30.3 26.0 - 34.0 pg   MCHC 32.7 30.0 - 36.0 g/dL   RDW 65.7 84.6 - 96.2 %   Platelets 175 150 - 400 K/uL   nRBC 0.0 0.0 - 0.2 %    Comment: Performed at [facility], [address] [place], [place] [address]  Urinalysis, Routine w reflex microscopic -Urine, Random     Status: Abnormal   Collection Time: 10/15/22 11:07 AM  Result Value Ref Range   Color, Urine YELLOW (A) YELLOW   APPearance CLEAR (A) CLEAR   Specific Gravity, Urine 1.009 1.005 - 1.030   pH 7.0 5.0 - 8.0   Glucose, UA NEGATIVE NEGATIVE mg/dL   Hgb urine dipstick NEGATIVE NEGATIVE   Bilirubin Urine NEGATIVE NEGATIVE   Ketones, ur NEGATIVE NEGATIVE mg/dL   Protein, ur NEGATIVE NEGATIVE mg/dL   Nitrite NEGATIVE NEGATIVE   Leukocytes,Ua NEGATIVE NEGATIVE    Comment: Performed at [facility], [address] [place], [place] [address]  Lactic acid, plasma     Status: None   Collection Time: 10/15/22 12:38 PM  Result Value Ref Range   Lactic Acid, Venous 0.9 0.5 - 1.9 mmol/L    Comment: Performed at [facility], [address] [place], [place] [address]   DG Abd 1 View  Result Date: 10/15/2022 CLINICAL DATA:  Sigmoid volvulus.  Rectal tube  placement EXAM: ABDOMEN - 1 VIEW COMPARISON:  CT earlier 10/15/2018 FINDINGS: Presumed rectal tube in place with the tip extending into the epigastric region in the coursing to the left midabdomen. There continues to be some dilated colon with a large amount of right-sided colonic stool. Level of dilatation of colonic loops appears similar to the prior CT. Small bowel is nondilated. No definite free air on these portable supine radiographs. Curvature and degenerative changes of the spine. Osteopenia. There is some contrast in the renal collecting systems and bladder from prior CT scan IMPRESSION: Placement of rectal tube. Continued distention of colon with significant right-sided stool Electronically Signed   By: Karen Kays M.D.   On: 10/15/2022 16:24   CT ABDOMEN PELVIS W CONTRAST  Result Date: 10/15/2022 CLINICAL DATA:  Left lower quadrant pain EXAM: CT ABDOMEN AND PELVIS WITH CONTRAST TECHNIQUE: Multidetector CT imaging of the abdomen and pelvis was performed using the standard protocol following bolus administration of intravenous contrast. RADIATION DOSE REDUCTION: This exam was performed according to the departmental dose-optimization program which includes automated exposure control, adjustment of the mA and/or kV according to patient size and/or use of iterative reconstruction technique. CONTRAST:  OMNIPAQUE IOHEXOL 300 MG/ML  SOLN COMPARISON:  None Available. FINDINGS: Lower chest: There is some linear opacity lung bases likely scar or atelectasis. No pleural effusion. Coronary artery calcifications are seen. Patulous lower esophagus.  Hepatobiliary: No focal liver abnormality is seen. No gallstones, gallbladder wall thickening, or biliary dilatation. Small appearing left hepatic lobe. Pancreas: Unremarkable. No pancreatic ductal dilatation or surrounding inflammatory changes. Spleen: Normal in size without focal abnormality. Adrenals/Urinary Tract: Adrenal glands are preserved. No enhancing renal mass or collecting system dilatation. There is parapelvic left-sided renal cyst. The ureters have normal course and caliber extending down to the bladder. Preserved contours of the urinary bladder. Stomach/Bowel: Subtle wall thickening along the rectum. Just proximal this there is a twisting of the mesentery as best appreciated on the coronal data set with swirling of the distal sigmoid colon the sigmoid colon extends into the right upper quadrant, up to the level of the diaphragm and is distended with a diameter approaching 6.5 cm, mildly dilated. This could be a sigmoid volvulus with only partial obstruction. Focal caliber change identified at the swirling, twisting location in the upper central pelvis as seen on axial series 2, image 61. The more proximal portion of the bowel to this with a large amount of stool but no dilatation. Appendix is not well seen. The stomach and small bowel are nondilated. Vascular/Lymphatic: Aortic atherosclerosis. No enlarged abdominal or pelvic lymph nodes. Reproductive: Prostate is unremarkable. Other: No free air or free fluid. No portal venous gas or pneumatosis. Small fat containing inguinal hernias. Musculoskeletal: Moderate degenerative changes of the spine and pelvis. There is grade 2 anterolisthesis with pars defects at L4-5. There is fusion across the disc space. Critical Value/emergent results were called by telephone at the time of interpretation on 10/15/2022 at 9:21 am to provider [facility] , who verbally acknowledged these results. IMPRESSION: Twisting appearance to the mesentery in the upper central  pelvis and along the distal sigmoid colon with a caliber change at the level of the twist and a mildly dilated appearance to the sigmoid colon extending up to the diaphragm. Based on the appearance this could represent early changes of sigmoid volvulus with a partial obstruction. No inflammatory changes, pneumatosis or more significant bowel dilatation proximal. Please correlate with clinical presentation. Of note proximal this there  is a large amount of stool in the colon. There is also some slight wall thickening along the rectum with some stranding. Please correlate for proctitis. Electronically Signed   By: Karen Kays M.D.   On: 10/15/2022 12:25    Pending Labs Unresulted Labs (From admission, onward)     Start     Ordered   10/16/22 0500  CBC with Differential/Platelet  Tomorrow morning,   R        10/15/22 1607   10/16/22 0500  Basic metabolic panel  Tomorrow morning,   R        10/15/22 1607   10/16/22 0500  Heparin level (unfractionated)  Tomorrow morning,   R        10/15/22 1646   10/16/22 0100  APTT  Once-Timed,   TIMED        10/15/22 1646   10/15/22 1647  Heparin level (unfractionated)  Add-on,   AD        10/15/22 1646   10/15/22 1347  Protime-INR  Add-on,   AD        10/15/22 1346   10/15/22 1347  APTT  Add-on,   AD        10/15/22 1346   10/15/22 1235  Lactic acid, plasma  Now then every 2 hours,   STAT      10/15/22 1234            Vitals/Pain Today's Vitals   10/15/22 1524 10/15/22 1642 10/15/22 1651 10/15/22 1700  BP:   (!) 197/94 (!) 177/92  Pulse: 64  67   Resp: 17  18   Temp:      TempSrc:      SpO2: 97%  97%   Weight:  99.8 kg    Height:  5\' 9"  (1.753 m)    PainSc:   0-No pain     Isolation Precautions No active isolations  Medications Medications  amantadine (SYMMETREL) capsule 100 mg (0 mg Oral Hold 10/15/22 1708)  carbidopa-levodopa (SINEMET CR) 50-200 MG per tablet controlled release 1 tablet (has no administration in time range)   carbidopa-levodopa (SINEMET IR) 25-100 MG per tablet immediate release 2 tablet (0 tablets Oral Hold 10/15/22 1709)  methocarbamol (ROBAXIN) tablet 1,000 mg (has no administration in time range)  rOPINIRole (REQUIP) tablet 3 mg (0 mg Oral Hold 10/15/22 1709)  tamsulosin (FLOMAX) capsule 0.8 mg (0 mg Oral Hold 10/15/22 1709)  traZODone (DESYREL) tablet 50 mg (has no administration in time range)  simvastatin (ZOCOR) tablet 40 mg (has no administration in time range)  oxybutynin (DITROPAN-XL) 24 hr tablet 10 mg (has no administration in time range)  heparin ADULT infusion 100 units/mL (25000 units/229mL) (0 Units/hr Intravenous Hold 10/15/22 1702)  hydrALAZINE (APRESOLINE) injection 5 mg (5 mg Intravenous Given 10/15/22 1702)  amLODipine (NORVASC) tablet 2.5 mg (has no administration in time range)  sodium chloride 0.9 % bolus 500 mL (0 mLs Intravenous Stopped 10/15/22 1129)  iohexol (OMNIPAQUE) 300 MG/ML solution 100 mL (100 mLs Intravenous Contrast Given 10/15/22 1147)    Mobility walks with person assist     Focused Assessments Endo procedure    R Recommendations: See Admitting Provider Note  Report given to:   Additional Notes: Strict NPO until MD Basaraba hears from surgery team

## 2022-10-15 NOTE — Consult Note (Signed)
[facility] SURGICAL CONSULTATION NOTE (initial) - cpt: 16109   HISTORY OF PRESENT ILLNESS (HPI):  72 y.o. male presented to [facility] ED today for evaluation of abdominal pain. Patient reports a long standing history of chronic constipation secondary to his history of Parkinson's Disease. He does try at home regimen with Miralax, and more recently enemas as well. He is also on chronic narcotic pain medications for degenerative disc and spinal stenosis. He presents today with increasing lower abdominal pain. This happens intermittently but tends to resolve on its own. Also now with watery diarrhea. No fever, chills, cough, CP< SOB, nausea, emesis, or blood in his stools. He is passing flatus. Only previous abdominal surgery is appendectomy ~60 years ago. Of note, he is also on Xarelto secondary to history of DVT/PE. Work up in the ED revealed a normal WBC to 7.2K, Hgb to 14.2, PLT 175K, renal function normal with sCr - 1.09, venous lactate normal at 0.9. He did have a CT Abdomen/Pelvis concerning for redundant sigmoid colon with changes concerning for volvulus. No free air nor pneumatosis. He was admitted to the medicine service. GI consulted as well with plans for flexible sigmoidoscopy this afternoon.    Surgery is consulted by emergency medicine physician Dr. Dionne Bucy, MD in this context for evaluation and management of possible volvulus of the sigmoid colon.  PAST MEDICAL HISTORY (PMH):  Past Medical History:  Diagnosis Date   Activated protein C resistance 06/28/2019   Arthritis of knee    Arthropathy of lumbar facet joint    Atrial fibrillation    BPH (benign prostatic hyperplasia) 09/10/2018   DVT (deep venous thrombosis)    L leg   Dysplastic nevus 05/15/2021   Left Superior Pectoral, mod to severe, Excised 07/16/21   Erectile dysfunction 03/01/2015   Herniated lumbar disc without myelopathy 10/11/2020   Hyperchloremia    Hypercholesterolemia    Hypertension     Inability to walk 02/22/2021   Insomnia due to medical condition 09/30/2018   Major depressive disorder 01/22/2022   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   Nephrolithiasis    Osteoarthritis    Parkinson's disease    Plantar wart    Pneumonia    Post-operative nausea and vomiting 09/20/2020   Pulmonary embolism    Sciatica    Seasonal allergies    TIA (transient ischemic attack)      PAST SURGICAL HISTORY (PSH):  Past Surgical History:  Procedure Laterality Date   APPENDECTOMY     CATARACT EXTRACTION Bilateral 10/2019 11/2019   COLONOSCOPY WITH PROPOFOL N/A 07/27/2019   Procedure: COLONOSCOPY WITH PROPOFOL;  Surgeon: Toney Reil, MD;  Location: [facility] ENDOSCOPY;  Service: Endoscopy;  Laterality: N/A;   EYE SURGERY     KNEE ARTHROSCOPY WITH MENISCAL REPAIR Right 2009   KNEE SURGERY Right    LUMBAR LAMINECTOMY/DECOMPRESSION MICRODISCECTOMY Left 10/11/2020   Procedure: Left Lumbar Three-Four, Lumbar Fou-Five  Microdiscectomy;  Surgeon: Maeola Harman, MD;  Location: [facility] OR;  Service: Neurosurgery;  Laterality: Left;   melanoma removal Left 07/16/2021   chest   T&A     TONSILLECTOMY       MEDICATIONS:  Prior to Admission medications   Medication Sig Start Date End Date Taking? Authorizing Provider  acetaminophen (TYLENOL) 500 MG tablet Take 1,000 mg by mouth daily.    [provider]  amantadine (SYMMETREL) 100 MG capsule TAKE ONE CAPSULE BY MOUTH THREE TIMES A DAY 09/09/22   Tat, Octaviano Batty, DO  amLODipine (  NORVASC) 2.5 MG tablet TAKE 1 TABLET (2.5 MG TOTAL) BY MOUTH DAILY AS NEEDED (BLOOD PRESSURE GREATER THAN 160/90). TAKE IN THE MORNING 07/28/22   Olevia Perches P, DO  calcium carbonate (TUMS - DOSED IN MG ELEMENTAL CALCIUM) 500 MG chewable tablet Chew 1 tablet by mouth daily as needed for indigestion or heartburn.    [provider]  carbidopa-levodopa (SINEMET CR) 50-200 MG tablet TAKE 1 TABLET BY MOUTH AT BEDTIME 07/21/22   Tat,  Rebecca S, DO  carbidopa-levodopa (SINEMET IR) 25-100 MG tablet TAKE TWO TABLETS BY MOUTH FOUR TIMES A DAY AT 6AM, 10AM, 2PM. AND 6PM. 10/13/22   Tat, Octaviano Batty, DO  cetirizine (ZYRTEC) 10 MG tablet Take 10 mg by mouth at bedtime.    [provider]  cholecalciferol (VITAMIN D3) 25 MCG (1000 UNIT) tablet Take 1,000 Units by mouth daily.    [provider]  diclofenac Sodium (VOLTAREN) 1 % GEL APPLY TOPICALLY 4 GRAMS FOUR TIMES DAILY Patient taking differently: Apply 4 g topically 4 (four) times daily as needed (pain). 01/24/20   Johnson, Megan P, DO  hydrALAZINE (APRESOLINE) 25 MG tablet Take 1 tablet (25 mg total) by mouth 3 (three) times daily as needed (for SBP >160). Takes in the evening 02/08/21   Antonieta Iba, MD  HYDROcodone-acetaminophen (NORCO/VICODIN) 5-325 MG tablet Take by mouth. 04/24/21   [provider]  Levodopa (INBRIJA) 42 MG CAPS Samples of this drug were given to the patient, quantity 1 box of 32 capsules, Lot Number [id number] Exp: 03/2023  Patient has been educated on dosage and uses of medication. 09/16/21   Tat, Octaviano Batty, DO  Levodopa (INBRIJA) 42 MG CAPS Samples of this drug were given to the patient, quantity 1, Lot Number [id number] Exp 12-24 04/04/22   Tat, Octaviano Batty, DO  lisinopril (ZESTRIL) 30 MG tablet Take 1 tablet (30 mg total) by mouth daily. 07/28/22   Johnson, Megan P, DO  melatonin 5 MG TABS Take 5 mg by mouth at bedtime.    [provider]  methocarbamol (ROBAXIN) 500 MG tablet Take 1 tablet (500 mg total) by mouth every 6 (six) hours as needed for muscle spasms. 10/12/20   Council Mechanic, NP  metoprolol succinate (TOPROL-XL) 25 MG 24 hr tablet Take 1 tablet (25 mg total) by mouth at bedtime. 07/28/22   Johnson, Megan P, DO  mirabegron ER (MYRBETRIQ) 25 MG TB24 tablet Take 1 tablet (25 mg total) by mouth daily. 09/01/22   Alfredo Martinez, MD  Multiple Vitamin (MULTIVITAMIN) tablet Take 1 tablet by mouth daily at 6 PM.     [provider]  oxybutynin (DITROPAN-XL) 10 MG 24 hr tablet Take 1 tablet (10 mg total) by mouth daily. 09/24/22   MacDiarmid, Lorin Picket, MD  polyethylene glycol powder (GLYCOLAX/MIRALAX) 17 GM/SCOOP powder Take 17 g by mouth 2 (two) times daily as needed. Patient taking differently: Take 17 g by mouth at bedtime. 02/21/20   Johnson, Megan P, DO  PREVIDENT 5000 BOOSTER PLUS 1.1 % PSTE Place 1 application onto teeth 2 (two) times daily. 02/26/18   [provider]  rivaroxaban (XARELTO) 20 MG TABS tablet Take 1 tablet (20 mg total) by mouth daily. 07/28/22   Johnson, Megan P, DO  rOPINIRole (REQUIP) 3 MG tablet TAKE ONE TABLET BY MOUTH THREE TIMES A DAY 07/21/22   Tat, Octaviano Batty, DO  simvastatin (ZOCOR) 40 MG tablet Take 1 tablet (40 mg total) by mouth daily. 07/28/22   Dorcas Carrow, DO  tamsulosin (FLOMAX) 0.4 MG CAPS capsule Take 2 capsules (0.8 mg total) by mouth daily. 07/28/22   Johnson, Megan P, DO  traZODone (DESYREL) 50 MG tablet Take 1 tablet (50 mg total) by mouth at bedtime. 07/28/22   Olevia Perches P, DO     ALLERGIES:  Allergies  Allergen Reactions   Codeine Other (See Comments)    Hallucinations   Other Nausea Only    General anesthesia      SOCIAL HISTORY:  Social History   Socioeconomic History   Marital status: Married    Spouse name: Wilkie Aye   Number of children: 0   Years of education: 14   Highest education level: Some college, no degree  Occupational History   Occupation: Retired    Comment: Therapist, sports  Tobacco Use   Smoking status: Never   Smokeless tobacco: Never  Vaping Use   Vaping Use: Never used  Substance and Sexual Activity   Alcohol use: No    Alcohol/week: 0.0 standard drinks of alcohol   Drug use: No   Sexual activity: Not Currently  Other Topics Concern   Not on file  Social History Narrative   ** Merged History Encounter **    right handed   Two story home    Live with spouse   Social Determinants of Health   Financial  Resource Strain: Low Risk  (07/21/2022)   Overall Financial Resource Strain (CARDIA)    Difficulty of Paying Living Expenses: Not hard at all  Food Insecurity: No Food Insecurity (07/21/2022)   Hunger Vital Sign    Worried About Running Out of Food in the Last Year: Never true    Ran Out of Food in the Last Year: Never true  Transportation Needs: No Transportation Needs (07/21/2022)   PRAPARE - Administrator, Civil Service (Medical): No    Lack of Transportation (Non-Medical): No  Physical Activity: Sufficiently Active (07/21/2022)   Exercise Vital Sign    Days of Exercise per Week: 3 days    Minutes of Exercise per Session: 60 min  Stress: No Stress Concern Present (07/21/2022)   [employer] of Occupational Health - Occupational Stress Questionnaire    Feeling of Stress : Only a little  Social Connections: Socially Integrated (07/21/2022)   Social Connection and Isolation Panel [NHANES]    Frequency of Communication with Friends and Family: Twice a week    Frequency of Social Gatherings with Friends and Family: Twice a week    Attends Religious Services: More than 4 times per year    Active Member of [employer] or Organizations: Yes    Attends Engineer, structural: More than 4 times per year    Marital Status: Married  Catering manager Violence: Not At Risk (07/21/2022)   Humiliation, Afraid, Rape, and Kick questionnaire    Fear of Current or Ex-Partner: No    Emotionally Abused: No    Physically Abused: No    Sexually Abused: No     FAMILY HISTORY:  Family History  Problem Relation Age of Onset   Parkinson's disease Father    Coronary artery disease Mother    Heart attack Sister    Deep vein thrombosis Sister       REVIEW OF SYSTEMS:  Review of Systems  Constitutional:  Negative for chills and fever.  Respiratory:  Negative for cough and shortness of breath.   Cardiovascular:  Negative for chest pain and palpitations.  Gastrointestinal:  Positive for  abdominal pain and diarrhea.  Negative for blood in stool, constipation, melena, nausea and vomiting.  Genitourinary:  Negative for dysuria and urgency.  All other systems reviewed and are negative.   VITAL SIGNS:  Temp:  [97.4 F (36.3 C)] 97.4 F (36.3 C) (07/03 0925) Pulse Rate:  [51-60] 53 (07/03 1230) Resp:  [17] 17 (07/03 1230) BP: (144-183)/(78-95) 183/83 (07/03 1230) SpO2:  [100 %] 100 % (07/03 1230)             INTAKE/OUTPUT:  No intake/output data recorded.  PHYSICAL EXAM:  Physical Exam Vitals and nursing note reviewed. Exam conducted with a chaperone present.  Constitutional:      General: He is not in acute distress.    Appearance: He is well-developed. He is not ill-appearing.     Comments: Resting in bed; NAD; wife at bedside   HENT:     Head: Normocephalic and atraumatic.  Eyes:     General: No scleral icterus.    Extraocular Movements: Extraocular movements intact.  Cardiovascular:     Rate and Rhythm: Normal rate.     Heart sounds: Normal heart sounds. No murmur heard. Pulmonary:     Effort: Pulmonary effort is normal. No respiratory distress.  Abdominal:     General: A surgical scar is present. There is no distension.     Palpations: Abdomen is soft.     Tenderness: There is no abdominal tenderness. There is no guarding or rebound.     Comments: Abdomen is soft, no overt tenderness, mild tympany centrally, no rebound/guarding. He is certainly without peritonitis. Previous open appendectomy scar noted in RLQ.   Genitourinary:    Comments: Deferred Skin:    Coloration: Skin is not pale.     Findings: No erythema.  Neurological:     General: No focal deficit present.     Mental Status: He is alert and oriented to person, place, and time.  Psychiatric:        Mood and Affect: Mood normal.        Behavior: Behavior normal.      Labs:     Latest Ref Rng & Units 10/15/2022    9:31 AM 07/28/2022   10:49 AM 01/22/2022   10:30 AM  CBC  WBC 4.0 - 10.5  K/uL 7.2  4.6  5.6   Hemoglobin 13.0 - 17.0 g/dL 16.1  09.6  04.5   Hematocrit 39.0 - 52.0 % 43.4  42.6  43.7   Platelets 150 - 400 K/uL 175  157  156       Latest Ref Rng & Units 10/15/2022    9:31 AM 07/28/2022   10:49 AM 01/22/2022   10:30 AM  CMP  Glucose 70 - 99 mg/dL 409  97  98   BUN 8 - 23 mg/dL 23  17  17    Creatinine 0.61 - 1.24 mg/dL 8.11  9.14  7.82   Sodium 135 - 145 mmol/L 140  138  139   Potassium 3.5 - 5.1 mmol/L 3.8  4.3  4.4   Chloride 98 - 111 mmol/L 105  103  102   CO2 22 - 32 mmol/L 25  22  22    Calcium 8.9 - 10.3 mg/dL 9.0  9.0  9.2   Total Protein 6.5 - 8.1 g/dL 7.0  6.8  6.8   Total Bilirubin 0.3 - 1.2 mg/dL 1.0  0.6  0.6   Alkaline Phos 38 - 126 U/L 79  96  101   AST 15 - 41 U/L  14  10  9    ALT 0 - 44 U/L 6  6  5       Imaging studies:   CT Abdomen/Pelvis (10/15/2022) personally reviewed showing redundant sigmoid colon with changes concerning for sigmoid volvulus, no free air, no pneumatosis, and radiologist report reviewed below:  IMPRESSION: Twisting appearance to the mesentery in the upper central pelvis and along the distal sigmoid colon with a caliber change at the level of the twist and a mildly dilated appearance to the sigmoid colon extending up to the diaphragm. Based on the appearance this could represent early changes of sigmoid volvulus with a partial obstruction. No inflammatory changes, pneumatosis or more significant bowel dilatation proximal. Please correlate with clinical presentation.   Of note proximal this there is a large amount of stool in the colon.   There is also some slight wall thickening along the rectum with some stranding. Please correlate for proctitis.   Assessment/Plan: (ICD-10's: K35.2) 72 y.o. male with sigmoid volvulus secondary to redundancy without evidence of perforation, pneumatosis, or complete obstruction, complicated by pertinent comorbidities including Parkinson's disease, Chronic Constipation, need for  anticoagulation .   - Appreciate medicine admission - Appreciate GI assistance; plan for sigmoidoscopy and decompression this afternoon    - At some point this admission, he will benefit from sigmoid colectomy to remove this area of redundancy to prevent recurrence. If sigmoidoscopy is successful, we will plan for this on Friday (07/05) with Dr Leland Johns to allow for gentle bowel prep. If at any time in the interim he recurs and deteriorates, or sigmoidoscopy is unsuccessful, we may need to proceed sooner than anticipated.   - NPO for sigmoidoscopy. Should be okay for CLD after procedure if no issues  - Will benefit from gentle bowel prep for surgical planning, pending sigmoidoscopy   - Monitor abdominal examination; on-going bowel function - Further management per primary service; we will follow along    All of the above findings and recommendations were discussed with the patient and his family, and all of their questions were answered to their expressed satisfaction.  Thank you for the opportunity to participate in this patient's care.   -- Lynden Oxford, PA-C [facility] 10/15/2022, 12:58 PM M-F: 7am - 4pm

## 2022-10-15 NOTE — Interval H&P Note (Signed)
History and Physical Interval Note: Preprocedure H&P from 10/15/22  was reviewed and there was no interval change after seeing and examining the patient.  Written consent was obtained from the patient after discussion of risks, benefits, and alternatives. Patient has consented to proceed with Colonoscopy with possible intervention   10/15/2022 1:56 PM  Thomas [name]  has presented today for surgery, with the diagnosis of sigmoid volvulus.  The various methods of treatment have been discussed with the patient and family. After consideration of risks, benefits and other options for treatment, the patient has consented to  Procedure(s): FLEXIBLE SIGMOIDOSCOPY (N/A) as a surgical intervention.  The patient's history has been reviewed, patient examined, no change in status, stable for surgery.  I have reviewed the patient's chart and labs.  Questions were answered to the patient's satisfaction.     Jaynie Collins

## 2022-10-15 NOTE — Assessment & Plan Note (Signed)
-   Hold home Xarelto in the setting of possible surgery - Heparin infusion per pharmacy dosing - Hold home metoprolol in the setting of bradycardia

## 2022-10-15 NOTE — Consult Note (Signed)
ANTICOAGULATION CONSULT NOTE - Initial Consult  Pharmacy Consult for Heparin Indication:  History of Afib and multiple DVT/PE (2007, 2016, 2020) on Xarelto. need to hold due to need for surgery  Allergies  Allergen Reactions   Codeine Other (See Comments)    Hallucinations   Other Nausea Only    General anesthesia     Patient Measurements: Height: 5\' 9"  (175.3 cm) Weight: 102.4 kg (225 lb 12 oz) IBW/kg (Calculated) : 70.7 Heparin Dosing Weight: 91.8 kg  Vital Signs: Temp: 98 F (36.7 C) (07/03 1938) Temp Source: Temporal (07/03 1444) BP: 157/73 (07/03 1938) Pulse Rate: 59 (07/03 1938)  Labs: Recent Labs    10/15/22 0931 10/15/22 1643  HGB 14.2  --   HCT 43.4  --   PLT 175  --   APTT  --  27  LABPROT  --  17.0*  INR  --  1.4*  HEPARINUNFRC  --  >1.10*  CREATININE 1.09  --      Estimated Creatinine Clearance: 71.2 mL/min (by C-G formula based on SCr of 1.09 mg/dL).   Medical History: Past Medical History:  Diagnosis Date   Activated protein C resistance 06/28/2019   Arthritis of knee    Arthropathy of lumbar facet joint    Atrial fibrillation    BPH (benign prostatic hyperplasia) 09/10/2018   DVT (deep venous thrombosis)    L leg   Dysplastic nevus 05/15/2021   Left Superior Pectoral, mod to severe, Excised 07/16/21   Erectile dysfunction 03/01/2015   Herniated lumbar disc without myelopathy 10/11/2020   Hyperchloremia    Hypercholesterolemia    Hypertension    Inability to walk 02/22/2021   Insomnia due to medical condition 09/30/2018   Major depressive disorder 01/22/2022   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   Nephrolithiasis    Osteoarthritis    Parkinson's disease    Plantar wart    Pneumonia    Post-operative nausea and vomiting 09/20/2020   Pulmonary embolism    Sciatica    Seasonal allergies    TIA (transient ischemic attack)     Medications:  Medications Prior to Admission  Medication Sig Dispense  Refill Last Dose   acetaminophen (TYLENOL) 500 MG tablet Take 1,000 mg by mouth daily.   unknown at prn   amantadine (SYMMETREL) 100 MG capsule TAKE ONE CAPSULE BY MOUTH THREE TIMES A DAY (Patient taking differently: Take 100 mg by mouth 3 (three) times daily.) 270 capsule 0 10/15/2022 at 0600   amLODipine (NORVASC) 2.5 MG tablet TAKE 1 TABLET (2.5 MG TOTAL) BY MOUTH DAILY AS NEEDED (BLOOD PRESSURE GREATER THAN 160/90). TAKE IN THE MORNING (Patient taking differently: Take 2.5 mg by mouth at bedtime. TAKE 1 TABLET (2.5 MG TOTAL) BY MOUTH DAILY AS NEEDED (BLOOD PRESSURE GREATER THAN 160/90). TAKE IN THE MORNING) 90 tablet 1 10/14/2022 at 2200   calcium carbonate (TUMS - DOSED IN MG ELEMENTAL CALCIUM) 500 MG chewable tablet Chew 1 tablet by mouth daily as needed for indigestion or heartburn.   unknown at prn   carbidopa-levodopa (SINEMET CR) 50-200 MG tablet TAKE 1 TABLET BY MOUTH AT BEDTIME 90 tablet 0 10/14/2022 at 2200   carbidopa-levodopa (SINEMET IR) 25-100 MG tablet TAKE TWO TABLETS BY MOUTH FOUR TIMES A DAY AT 6AM, 10AM, 2PM. AND 6PM. (Patient taking differently: 2 tablets 4 (four) times daily. TAKE TWO TABLETS BY MOUTH FOUR TIMES A DAY AT 6AM, 10AM, 2PM. AND 6PM.) 720 tablet 0 10/15/2022 at 0600   cetirizine (  ZYRTEC) 10 MG tablet Take 10 mg by mouth at bedtime.   10/14/2022 at 2200   cholecalciferol (VITAMIN D3) 25 MCG (1000 UNIT) tablet Take 1,000 Units by mouth daily.   10/15/2022 at 0600   diclofenac Sodium (VOLTAREN) 1 % GEL APPLY TOPICALLY 4 GRAMS FOUR TIMES DAILY (Patient taking differently: Apply 4 g topically 4 (four) times daily as needed (pain).) 400 g 1 unknown at prn   hydrALAZINE (APRESOLINE) 25 MG tablet Take 1 tablet (25 mg total) by mouth 3 (three) times daily as needed (for SBP >160). Takes in the evening 90 tablet 3 unknown at prn   HYDROcodone-acetaminophen (NORCO/VICODIN) 5-325 MG tablet Take 1 tablet by mouth 2 (two) times daily.   10/14/2022   Levodopa (INBRIJA) 42 MG CAPS Samples of this  drug were given to the patient, quantity 1 box of 32 capsules, Lot Number [id number] Exp: 03/2023  Patient has been educated on dosage and uses of medication. 32 capsule 0 unknown at prn   Levodopa (INBRIJA) 42 MG CAPS Samples of this drug were given to the patient, quantity 1, Lot Number [id number] Exp 12-24 60 capsule 0 unknown at prn   lisinopril (ZESTRIL) 30 MG tablet Take 1 tablet (30 mg total) by mouth daily. 90 tablet 1 10/14/2022 at 2200   melatonin 5 MG TABS Take 5 mg by mouth at bedtime.   10/14/2022 at 2200   methocarbamol (ROBAXIN) 500 MG tablet Take 1 tablet (500 mg total) by mouth every 6 (six) hours as needed for muscle spasms. 90 tablet 0 10/15/2022 at 0600   metoprolol succinate (TOPROL-XL) 25 MG 24 hr tablet Take 1 tablet (25 mg total) by mouth at bedtime. 90 tablet 1 10/14/2022 at 2200   Multiple Vitamin (MULTIVITAMIN) tablet Take 1 tablet by mouth daily at 6 PM.   10/14/2022 at 1800   oxybutynin (DITROPAN-XL) 10 MG 24 hr tablet Take 1 tablet (10 mg total) by mouth daily. 30 tablet 11 10/15/2022 at 0600   Polyethyl Glycol-Propyl Glycol (SYSTANE ULTRA) 0.4-0.3 % SOLN Place 1 drop into both eyes daily as needed (DRY EYES).   unknown at prn   polyethylene glycol powder (GLYCOLAX/MIRALAX) 17 GM/SCOOP powder Take 17 g by mouth 2 (two) times daily as needed. (Patient taking differently: Take 17 g by mouth at bedtime.) 3350 g 1 10/14/2022 at 1900   PREVIDENT 5000 BOOSTER PLUS 1.1 % PSTE Place 1 application onto teeth 2 (two) times daily.   10/15/2022 at 0600   rivaroxaban (XARELTO) 20 MG TABS tablet Take 1 tablet (20 mg total) by mouth daily. 90 tablet 1 10/14/2022 at 1600   rOPINIRole (REQUIP) 3 MG tablet TAKE ONE TABLET BY MOUTH THREE TIMES A DAY (Patient taking differently: Take 3 mg by mouth in the morning, at noon, and at bedtime.) 270 tablet 0 10/15/2022 at 0600   simvastatin (ZOCOR) 40 MG tablet Take 1 tablet (40 mg total) by mouth daily. 90 tablet 2 10/14/2022 at 2200   tamsulosin (FLOMAX) 0.4 MG CAPS  capsule Take 2 capsules (0.8 mg total) by mouth daily. 180 capsule 1 10/14/2022 at 1400   traZODone (DESYREL) 50 MG tablet Take 1 tablet (50 mg total) by mouth at bedtime. 90 tablet 1 10/14/2022 at 2200   mirabegron ER (MYRBETRIQ) 25 MG TB24 tablet Take 1 tablet (25 mg total) by mouth daily. (Patient not taking: Reported on 10/15/2022) 30 tablet 0 Not Taking   Scheduled:   amantadine  100 mg Oral TID   amLODipine  2.5 mg  Oral QHS   [START ON 10/16/2022] bisacodyl  20 mg Oral Once   carbidopa-levodopa  1 tablet Oral QHS   carbidopa-levodopa  2 tablet Oral QID   heparin  4,000 Units Intravenous Once   methocarbamol  1,000 mg Oral BID   [START ON 10/16/2022] metroNIDAZOLE  1,000 mg Oral Q8H   [START ON 10/16/2022] neomycin  1,000 mg Oral Q8H   [START ON 10/16/2022] oxybutynin  10 mg Oral Daily   [START ON 10/16/2022] polyethylene glycol powder  1 Container Oral Once   rOPINIRole  3 mg Oral TID   [START ON 10/16/2022] simvastatin  40 mg Oral q1800   tamsulosin  0.8 mg Oral Daily   traZODone  50 mg Oral QHS   Infusions:   [START ON 10/17/2022] cefoTEtan (CEFOTAN) IV     heparin Stopped (10/15/22 1702)   lactated ringers     PRN: acetaminophen, hydrALAZINE, ondansetron (ZOFRAN) IV Anti-infectives (From admission, onward)    Start     Dose/Rate Route Frequency Ordered Stop   10/17/22 0600  cefoTEtan (CEFOTAN) 2 g in sodium chloride 0.9 % 100 mL IVPB        2 g 200 mL/hr over 30 Minutes Intravenous On call to O.R. 10/15/22 1819 10/18/22 0559   10/16/22 0600  neomycin (MYCIFRADIN) tablet 1,000 mg        1,000 mg Oral Every 8 hours 10/15/22 1819 10/17/22 0559   10/16/22 0600  metroNIDAZOLE (FLAGYL) tablet 1,000 mg        1,000 mg Oral Every 8 hours 10/15/22 1819 10/17/22 0559       Assessment: 72 y.o. male with medical history significant of Parkinson's disease with mild neurocognitive disorder, hypertension, atrial fibrillation on Xarelto, hyperlipidemia, previous DVT, BPH, TIA, who presents to the ED  due to abdominal pain. Last dose of rivaroxaban was 7/2. S/p sigmoidoscopy. Possible colon resection sigmoid 7/5. Will use aPTT for heparin monitoring. CBC stable.   - NP called to state heparin gtt not started.  Sigmoidoscopy rescheduled for 7/8 Monday per NP.  Will reorder heparin 4000 units IV X 1 bolus (Not given as of yet) and resume heparin drip as originally ordered on 7/3 @ 2200.  Will retime aPTT and HL accordingly.    Goal of Therapy:  aPTT 66-102 seconds Monitor platelets by anticoagulation protocol: Yes   Plan:  No bolus due to recent procedure. Start heparin infusion at 1250 units/hr Check anti-Xa level in 8 hours and daily while on heparin Continue to monitor H&H and platelets  Younes Degeorge D, PharmD, BCPS 10/15/2022,10:08 PM

## 2022-10-15 NOTE — H&P (Signed)
History and Physical    Patient: Thomas [name] XBJ:[id number] DOB: 04/19/1950 DOA: 10/15/2022 DOS: the patient was seen and examined on 10/15/2022 PCP: Dorcas Carrow, DO  Patient coming from: Home  Chief Complaint:  Chief Complaint  Patient presents with   Abdominal Pain   HPI: Thomas [name] is a 72 y.o. male with medical history significant of Parkinson's disease with mild neurocognitive disorder, hypertension, atrial fibrillation on Xarelto, hyperlipidemia, previous DVT, BPH, TIA, who presents to the ED due to abdominal pain.  Thomas [name] states that over the last 1 week, he has been having constipation and lower quadrant abdominal pain.  He tried to take an enema and afterwards, he developed watery diarrhea that has lasted for approximately 5 days.  He has experienced approximately 2-3 episodes per day.  Then today, his blood pressure was on the lower end of normal with systolics in the 90s with associated lightheadedness.  Due to this, his wife brought him to the ER.  At this time, no reported fever, chills chest pain, shortness of breath, nausea, vomiting.  No hematochezia or melena.  ED course: On arrival to the ED, patient was hypertensive at 144/95 with heart rate of 56.  He was saturating at 100% on room air.  He was afebrile at 97.4. Initial workup demonstrated normal CBC and CMP with only abnormality of glucose of 111.  Creatinine 1.09 with GFR above 60.  Urinalysis with no abnormal findings.  CT of the abdomen was obtained that demonstrated twisting appearance of the mesentery along the distal sigmoid concerning for volvulus.  In addition, there was slight wall thickening along the rectum.  GI and general surgery were consulted with plans for colonoscopy.  [facility] contacted for admission.  Review of Systems: As mentioned in the history of present illness. All other systems reviewed and are negative.  Past Medical History:  Diagnosis Date   Activated protein C resistance 06/28/2019    Arthritis of knee    Arthropathy of lumbar facet joint    Atrial fibrillation    BPH (benign prostatic hyperplasia) 09/10/2018   DVT (deep venous thrombosis)    L leg   Dysplastic nevus 05/15/2021   Left Superior Pectoral, mod to severe, Excised 07/16/21   Erectile dysfunction 03/01/2015   Herniated lumbar disc without myelopathy 10/11/2020   Hyperchloremia    Hypercholesterolemia    Hypertension    Inability to walk 02/22/2021   Insomnia due to medical condition 09/30/2018   Major depressive disorder 01/22/2022   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   Nephrolithiasis    Osteoarthritis    Parkinson's disease    Plantar wart    Pneumonia    Post-operative nausea and vomiting 09/20/2020   Pulmonary embolism    Sciatica    Seasonal allergies    TIA (transient ischemic attack)    Past Surgical History:  Procedure Laterality Date   APPENDECTOMY     CATARACT EXTRACTION Bilateral 10/2019 11/2019   COLONOSCOPY WITH PROPOFOL N/A 07/27/2019   Procedure: COLONOSCOPY WITH PROPOFOL;  Surgeon: Toney Reil, MD;  Location: [facility] ENDOSCOPY;  Service: Endoscopy;  Laterality: N/A;   EYE SURGERY     KNEE ARTHROSCOPY WITH MENISCAL REPAIR Right 2009   KNEE SURGERY Right    LUMBAR LAMINECTOMY/DECOMPRESSION MICRODISCECTOMY Left 10/11/2020   Procedure: Left Lumbar Three-Four, Lumbar Fou-Five  Microdiscectomy;  Surgeon: Maeola Harman, MD;  Location: [facility] OR;  Service: Neurosurgery;  Laterality: Left;   melanoma removal Left 07/16/2021   chest  T&A     TONSILLECTOMY     Social History:  reports that he has never smoked. He has never used smokeless tobacco. He reports that he does not drink alcohol and does not use drugs.  Allergies  Allergen Reactions   Codeine Other (See Comments)    Hallucinations   Other Nausea Only    General anesthesia     Family History  Problem Relation Age of Onset   Parkinson's disease Father    Coronary artery disease Mother     Heart attack Sister    Deep vein thrombosis Sister     Prior to Admission medications   Medication Sig Start Date End Date Taking? Authorizing Provider  acetaminophen (TYLENOL) 500 MG tablet Take 1,000 mg by mouth daily.    [provider]  amantadine (SYMMETREL) 100 MG capsule TAKE ONE CAPSULE BY MOUTH THREE TIMES A DAY 09/09/22   Tat, Octaviano Batty, DO  amLODipine (NORVASC) 2.5 MG tablet TAKE 1 TABLET (2.5 MG TOTAL) BY MOUTH DAILY AS NEEDED (BLOOD PRESSURE GREATER THAN 160/90). TAKE IN THE MORNING 07/28/22   Olevia Perches P, DO  calcium carbonate (TUMS - DOSED IN MG ELEMENTAL CALCIUM) 500 MG chewable tablet Chew 1 tablet by mouth daily as needed for indigestion or heartburn.    [provider]  carbidopa-levodopa (SINEMET CR) 50-200 MG tablet TAKE 1 TABLET BY MOUTH AT BEDTIME 07/21/22   Tat, Rebecca S, DO  carbidopa-levodopa (SINEMET IR) 25-100 MG tablet TAKE TWO TABLETS BY MOUTH FOUR TIMES A DAY AT 6AM, 10AM, 2PM. AND 6PM. 10/13/22   Tat, Octaviano Batty, DO  cetirizine (ZYRTEC) 10 MG tablet Take 10 mg by mouth at bedtime.    [provider]  cholecalciferol (VITAMIN D3) 25 MCG (1000 UNIT) tablet Take 1,000 Units by mouth daily.    [provider]  diclofenac Sodium (VOLTAREN) 1 % GEL APPLY TOPICALLY 4 GRAMS FOUR TIMES DAILY Patient taking differently: Apply 4 g topically 4 (four) times daily as needed (pain). 01/24/20   Johnson, Megan P, DO  hydrALAZINE (APRESOLINE) 25 MG tablet Take 1 tablet (25 mg total) by mouth 3 (three) times daily as needed (for SBP >160). Takes in the evening 02/08/21   Antonieta Iba, MD  HYDROcodone-acetaminophen (NORCO/VICODIN) 5-325 MG tablet Take by mouth. 04/24/21   [provider]  Levodopa (INBRIJA) 42 MG CAPS Samples of this drug were given to the patient, quantity 1 box of 32 capsules, Lot Number [id number] Exp: 03/2023  Patient has been educated on dosage and uses of medication. 09/16/21   Tat, Octaviano Batty, DO  Levodopa  (INBRIJA) 42 MG CAPS Samples of this drug were given to the patient, quantity 1, Lot Number [id number] Exp 12-24 04/04/22   Tat, Octaviano Batty, DO  lisinopril (ZESTRIL) 30 MG tablet Take 1 tablet (30 mg total) by mouth daily. 07/28/22   Johnson, Megan P, DO  melatonin 5 MG TABS Take 5 mg by mouth at bedtime.    [provider]  methocarbamol (ROBAXIN) 500 MG tablet Take 1 tablet (500 mg total) by mouth every 6 (six) hours as needed for muscle spasms. 10/12/20   Council Mechanic, NP  metoprolol succinate (TOPROL-XL) 25 MG 24 hr tablet Take 1 tablet (25 mg total) by mouth at bedtime. 07/28/22   Johnson, Megan P, DO  mirabegron ER (MYRBETRIQ) 25 MG TB24 tablet Take 1 tablet (25 mg total) by mouth daily. 09/01/22   Alfredo Martinez, MD  Multiple Vitamin (MULTIVITAMIN) tablet Take 1 tablet  by mouth daily at 6 PM.    [provider]  oxybutynin (DITROPAN-XL) 10 MG 24 hr tablet Take 1 tablet (10 mg total) by mouth daily. 09/24/22   MacDiarmid, Lorin Picket, MD  polyethylene glycol powder (GLYCOLAX/MIRALAX) 17 GM/SCOOP powder Take 17 g by mouth 2 (two) times daily as needed. Patient taking differently: Take 17 g by mouth at bedtime. 02/21/20   Johnson, Megan P, DO  PREVIDENT 5000 BOOSTER PLUS 1.1 % PSTE Place 1 application onto teeth 2 (two) times daily. 02/26/18   [provider]  rivaroxaban (XARELTO) 20 MG TABS tablet Take 1 tablet (20 mg total) by mouth daily. 07/28/22   Johnson, Megan P, DO  rOPINIRole (REQUIP) 3 MG tablet TAKE ONE TABLET BY MOUTH THREE TIMES A DAY 07/21/22   Tat, Octaviano Batty, DO  simvastatin (ZOCOR) 40 MG tablet Take 1 tablet (40 mg total) by mouth daily. 07/28/22   Johnson, Megan P, DO  tamsulosin (FLOMAX) 0.4 MG CAPS capsule Take 2 capsules (0.8 mg total) by mouth daily. 07/28/22   Johnson, Megan P, DO  traZODone (DESYREL) 50 MG tablet Take 1 tablet (50 mg total) by mouth at bedtime. 07/28/22   Dorcas Carrow, DO    Physical Exam: Vitals:   10/15/22 1100 10/15/22 1130  10/15/22 1200 10/15/22 1230  BP: (!) 164/82 (!) 177/78 (!) 171/78 (!) 183/83  Pulse: (!) 54 (!) 51 60 (!) 53  Resp:    17  Temp:      TempSrc:      SpO2: 100% 100% 100% 100%   Physical Exam Vitals and nursing note reviewed.  Constitutional:      Appearance: He is normal weight.  HENT:     Head: Normocephalic and atraumatic.  Cardiovascular:     Rate and Rhythm: Regular rhythm. Bradycardia present.     Heart sounds: No murmur heard.    Comments: Trace bilateral pitting edema Pulmonary:     Effort: Pulmonary effort is normal. No respiratory distress.     Breath sounds: Normal breath sounds. No wheezing or rales.  Abdominal:     General: Bowel sounds are absent. There is no distension.     Palpations: Abdomen is soft.     Tenderness: There is abdominal tenderness in the right lower quadrant and left lower quadrant. There is no guarding.     Hernia: No hernia is present.  Skin:    General: Skin is warm and dry.  Neurological:     General: No focal deficit present.     Mental Status: He is alert and oriented to person, place, and time.     Comments: Mild resting tremor noted  Psychiatric:        Mood and Affect: Mood normal.        Behavior: Behavior normal.    Data Reviewed: CBC with WBC 7.2, hemoglobin 14.2, MCV of 92, platelets 175 CMP with sodium of 140, potassium 3.8, bicarb 25, glucose 111, BUN 23, creatinine 1.09, AST 14, ALT 6 and GFR above 60 Urinalysis with no glucosuria, proteinuria, hematuria, leukocytes or nitrites  CT ABDOMEN PELVIS W CONTRAST  Result Date: 10/15/2022 CLINICAL DATA:  Left lower quadrant pain EXAM: CT ABDOMEN AND PELVIS WITH CONTRAST TECHNIQUE: Multidetector CT imaging of the abdomen and pelvis was performed using the standard protocol following bolus administration of intravenous contrast. RADIATION DOSE REDUCTION: This exam was performed according to the departmental dose-optimization program which includes automated exposure control, adjustment  of the mA and/or kV according to patient  size and/or use of iterative reconstruction technique. CONTRAST:  OMNIPAQUE IOHEXOL 300 MG/ML  SOLN COMPARISON:  None Available. FINDINGS: Lower chest: There is some linear opacity lung bases likely scar or atelectasis. No pleural effusion. Coronary artery calcifications are seen. Patulous lower esophagus. Hepatobiliary: No focal liver abnormality is seen. No gallstones, gallbladder wall thickening, or biliary dilatation. Small appearing left hepatic lobe. Pancreas: Unremarkable. No pancreatic ductal dilatation or surrounding inflammatory changes. Spleen: Normal in size without focal abnormality. Adrenals/Urinary Tract: Adrenal glands are preserved. No enhancing renal mass or collecting system dilatation. There is parapelvic left-sided renal cyst. The ureters have normal course and caliber extending down to the bladder. Preserved contours of the urinary bladder. Stomach/Bowel: Subtle wall thickening along the rectum. Just proximal this there is a twisting of the mesentery as best appreciated on the coronal data set with swirling of the distal sigmoid colon the sigmoid colon extends into the right upper quadrant, up to the level of the diaphragm and is distended with a diameter approaching 6.5 cm, mildly dilated. This could be a sigmoid volvulus with only partial obstruction. Focal caliber change identified at the swirling, twisting location in the upper central pelvis as seen on axial series 2, image 61. The more proximal portion of the bowel to this with a large amount of stool but no dilatation. Appendix is not well seen. The stomach and small bowel are nondilated. Vascular/Lymphatic: Aortic atherosclerosis. No enlarged abdominal or pelvic lymph nodes. Reproductive: Prostate is unremarkable. Other: No free air or free fluid. No portal venous gas or pneumatosis. Small fat containing inguinal hernias. Musculoskeletal: Moderate degenerative changes of the spine and  pelvis. There is grade 2 anterolisthesis with pars defects at L4-5. There is fusion across the disc space. Critical Value/emergent results were called by telephone at the time of interpretation on 10/15/2022 at 9:21 am to provider [facility] , who verbally acknowledged these results. IMPRESSION: Twisting appearance to the mesentery in the upper central pelvis and along the distal sigmoid colon with a caliber change at the level of the twist and a mildly dilated appearance to the sigmoid colon extending up to the diaphragm. Based on the appearance this could represent early changes of sigmoid volvulus with a partial obstruction. No inflammatory changes, pneumatosis or more significant bowel dilatation proximal. Please correlate with clinical presentation. Of note proximal this there is a large amount of stool in the colon. There is also some slight wall thickening along the rectum with some stranding. Please correlate for proctitis. Electronically Signed   By: Karen Kays M.D.   On: 10/15/2022 12:25    Results are pending, will review when available.  Assessment and Plan:  * Sigmoid volvulus (HCC) CT imaging consistent with sigmoid volvulus with no evidence of complication at this time.  - GI and general surgery consulted; appreciate their recommendations - N.p.o. - Plan is for colonoscopy today - Need for eventual colectomy per general surgery - Maintenance IV fluids  DVT (deep venous thrombosis) (HCC) Per chart review, patient has history of recurrent DVT, initially diagnosed in 2016 and again in 2020.  Due to recurrent nature and high risk for DVT/PE given possible need for surgery, will need to initiate heparin infusion.  - Heparin infusion per pharmacy dosing  Atrial fibrillation (HCC) - Hold home Xarelto in the setting of possible surgery - Heparin infusion per pharmacy dosing - Hold home metoprolol in the setting of bradycardia  Hypertension Patient's family reports hypotension  at home.  Blood pressure since  arriving in the ED has been high likely due to stress.  Will hold any further antihypertensives today and resume tomorrow if blood pressure remains stable  - Consider restarting home antihypertensives tomorrow  Parkinson's disease - Continue home Sinemet and amantadine  Advance Care Planning:   Code Status: Full Code verified by patient with wife at bedside  Consults: GI, general surgery  Family Communication: Updated patient's wife at bedside.   Severity of Illness: The appropriate patient status for this patient is INPATIENT. Inpatient status is judged to be reasonable and necessary in order to provide the required intensity of service to ensure the patient's safety. The patient's presenting symptoms, physical exam findings, and initial radiographic and laboratory data in the context of their chronic comorbidities is felt to place them at high risk for further clinical deterioration. Furthermore, it is not anticipated that the patient will be medically stable for discharge from the hospital within 2 midnights of admission.   * I certify that at the point of admission it is my clinical judgment that the patient will require inpatient hospital care spanning beyond 2 midnights from the point of admission due to high intensity of service, high risk for further deterioration and high frequency of surveillance required.*  Author: Verdene Lennert, MD 10/15/2022 1:40 PM  For on call review [email or web address].

## 2022-10-15 NOTE — Assessment & Plan Note (Addendum)
CT imaging consistent with sigmoid volvulus with no evidence of complication at this time.  - GI and general surgery consulted; appreciate their recommendations - N.p.o. - Plan is for colonoscopy today - Need for eventual colectomy per general surgery - Maintenance IV fluids

## 2022-10-15 NOTE — Telephone Encounter (Signed)
The patient's BP was 94/60 when last checked by their wife at 10 PM 10/14/22   The patient's wife is also concerned that the patient may be experiencing internal discomfort    Chief Complaint: Low BP 94/61 yesterday and today. Having watery diarrhea x 2-3 days. Abdominal cramping. Wife concerned because of his Parkinson's disease. Symptoms: Above, not drinking well per wife. Frequency: 2-3 days Pertinent Negatives: Patient denies  Disposition: [] ED /[] Urgent Care (no appt availability in office) / [] Appointment(In office/virtual)/ []  [facility]/ [] Home Care/ [x] Refused Recommended Disposition /[] [facility]/ []  Follow-up with PCP Additional Notes: Wife states "I'll take him to UC first because of our insurance."  Reason for Disposition  Patient sounds very sick or weak to the triager  Answer Assessment - Initial Assessment Questions 1. BLOOD PRESSURE: "What is the blood pressure?" "Did you take at least two measurements 5 minutes apart?"     94/61 2. ONSET: "When did you take your blood pressure?"     Yesterday 3. HOW: "How did you obtain the blood pressure?" (e.g., visiting nurse, automatic home BP monitor)     Home cuff 4. HISTORY: "Do you have a history of low blood pressure?" "What is your blood pressure normally?"     No 5. MEDICINES: "Are you taking any medications for blood pressure?" If Yes, ask: "Have they been changed recently?"     Yes 6. PULSE RATE: "Do you know what your pulse rate is?"      59 7. OTHER SYMPTOMS: "Have you been sick recently?" "Have you had a recent injury?"     Yes - diarrhea 8. PREGNANCY: "Is there any chance you are pregnant?" "When was your last menstrual period?"     N/a  Protocols used: Blood Pressure - Low-A-AH

## 2022-10-15 NOTE — Assessment & Plan Note (Signed)
Per chart review, patient has history of recurrent DVT, initially diagnosed in 2016 and again in 2020.  Due to recurrent nature and high risk for DVT/PE given possible need for surgery, will need to initiate heparin infusion.  - Heparin infusion per pharmacy dosing

## 2022-10-15 NOTE — Consult Note (Signed)
  Inpatient Consultation   Patient ID: Thomas [name] is a 72 y.o. male.  Requesting Provider: Sebastian Siadecki, MD (EM)  Date of Admission: 10/15/2022  Date of Consult: 10/15/22   Reason for Consultation: sigmoid volvulus   Patient's Chief Complaint:   Chief Complaint  Patient presents with   Abdominal Pain    72-year-old Caucasian male with history of Parkinson's disease, history of DVT and PE on Xarelto, A-fib, constipation, chronic opioid use, history of TIA who presents to the hospital with 5 days of lower abdominal pain and diarrhea.  GI is consulted for sigmoid volvulus.  His wife is at bedside who helps provide history.  Patient presented after 5 days of the above symptoms.  He denies any melena or hematochezia.  No nausea or vomiting.  His symptoms are becoming worse over the last few days which prompted him to come to the emergency department. His wife notes some loose liquid stools that have been clear in nature.  He frequently deals with constipation at home due to risk Parkinson's and opioid use.  He uses MiraLAX at home for this.  CT revealed sigmoid volvulus.  General surgery also at bedside at time of evaluation.  No other upper GI symptoms.  Last dose of Xarelto last night.  Denies NSAIDs, Anti-plt agents Denies family history of gastrointestinal disease and malignancy Previous Endoscopies:  07/27/2019- Colonoscopy- difficult, poor prep. Polyps present.     Past Medical History:  Diagnosis Date   Activated protein C resistance 06/28/2019   Arthritis of knee    Arthropathy of lumbar facet joint    Atrial fibrillation    BPH (benign prostatic hyperplasia) 09/10/2018   DVT (deep venous thrombosis)    L leg   Dysplastic nevus 05/15/2021   Left Superior Pectoral, mod to severe, Excised 07/16/21   Erectile dysfunction 03/01/2015   Herniated lumbar disc without myelopathy 10/11/2020   Hyperchloremia    Hypercholesterolemia    Hypertension    Inability  to walk 02/22/2021   Insomnia due to medical condition 09/30/2018   Major depressive disorder 01/22/2022   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   Nephrolithiasis    Osteoarthritis    Parkinson's disease    Plantar wart    Pneumonia    Post-operative nausea and vomiting 09/20/2020   Pulmonary embolism    Sciatica    Seasonal allergies    TIA (transient ischemic attack)     Past Surgical History:  Procedure Laterality Date   APPENDECTOMY     CATARACT EXTRACTION Bilateral 10/2019 11/2019   COLONOSCOPY WITH PROPOFOL N/A 07/27/2019   Procedure: COLONOSCOPY WITH PROPOFOL;  Surgeon: Vanga, Rohini Reddy, MD;  Location: [facility] ENDOSCOPY;  Service: Endoscopy;  Laterality: N/A;   EYE SURGERY     KNEE ARTHROSCOPY WITH MENISCAL REPAIR Right 2009   KNEE SURGERY Right    LUMBAR LAMINECTOMY/DECOMPRESSION MICRODISCECTOMY Left 10/11/2020   Procedure: Left Lumbar Three-Four, Lumbar Fou-Five  Microdiscectomy;  Surgeon: Stern, Joseph, MD;  Location: [facility] OR;  Service: Neurosurgery;  Laterality: Left;   melanoma removal Left 07/16/2021   chest   T&A     TONSILLECTOMY      Allergies  Allergen Reactions   Codeine Other (See Comments)    Hallucinations   Other Nausea Only    General anesthesia     Family History  Problem Relation Age of Onset   Parkinson's disease Father    Coronary artery disease Mother    Heart attack Sister      Deep vein thrombosis Sister     Social History   Tobacco Use   Smoking status: Never   Smokeless tobacco: Never  Vaping Use   Vaping Use: Never used  Substance Use Topics   Alcohol use: No    Alcohol/week: 0.0 standard drinks of alcohol   Drug use: No     Pertinent GI related history and allergies were reviewed with the patient  Review of Systems  Constitutional:  Negative for activity change, appetite change, chills, diaphoresis, fatigue, fever and unexpected weight change.  HENT:  Negative for trouble swallowing and voice  change.   Respiratory:  Negative for shortness of breath and wheezing.   Cardiovascular:  Negative for chest pain, palpitations and leg swelling.  Gastrointestinal:  Positive for abdominal pain and diarrhea. Negative for abdominal distention, anal bleeding, blood in stool, constipation, nausea and vomiting.  Musculoskeletal:  Negative for arthralgias and myalgias.  Skin:  Negative for color change and pallor.  Neurological:  Negative for dizziness, syncope and weakness.  Psychiatric/Behavioral:  Negative for confusion. The patient is not nervous/anxious.   All other systems reviewed and are negative.    Medications Home Medications No current facility-administered medications on file prior to encounter.   Current Outpatient Medications on File Prior to Encounter  Medication Sig Dispense Refill   acetaminophen (TYLENOL) 500 MG tablet Take 1,000 mg by mouth daily.     amantadine (SYMMETREL) 100 MG capsule TAKE ONE CAPSULE BY MOUTH THREE TIMES A DAY 270 capsule 0   amLODipine (NORVASC) 2.5 MG tablet TAKE 1 TABLET (2.5 MG TOTAL) BY MOUTH DAILY AS NEEDED (BLOOD PRESSURE GREATER THAN 160/90). TAKE IN THE MORNING 90 tablet 1   calcium carbonate (TUMS - DOSED IN MG ELEMENTAL CALCIUM) 500 MG chewable tablet Chew 1 tablet by mouth daily as needed for indigestion or heartburn.     carbidopa-levodopa (SINEMET CR) 50-200 MG tablet TAKE 1 TABLET BY MOUTH AT BEDTIME 90 tablet 0   carbidopa-levodopa (SINEMET IR) 25-100 MG tablet TAKE TWO TABLETS BY MOUTH FOUR TIMES A DAY AT 6AM, 10AM, 2PM. AND 6PM. 720 tablet 0   cetirizine (ZYRTEC) 10 MG tablet Take 10 mg by mouth at bedtime.     cholecalciferol (VITAMIN D3) 25 MCG (1000 UNIT) tablet Take 1,000 Units by mouth daily.     diclofenac Sodium (VOLTAREN) 1 % GEL APPLY TOPICALLY 4 GRAMS FOUR TIMES DAILY (Patient taking differently: Apply 4 g topically 4 (four) times daily as needed (pain).) 400 g 1   hydrALAZINE (APRESOLINE) 25 MG tablet Take 1 tablet (25 mg  total) by mouth 3 (three) times daily as needed (for SBP >160). Takes in the evening 90 tablet 3   HYDROcodone-acetaminophen (NORCO/VICODIN) 5-325 MG tablet Take by mouth.     Levodopa (INBRIJA) 42 MG CAPS Samples of this drug were given to the patient, quantity 1 box of 32 capsules, Lot Number [id number] Exp: 03/2023  Patient has been educated on dosage and uses of medication. 32 capsule 0   Levodopa (INBRIJA) 42 MG CAPS Samples of this drug were given to the patient, quantity 1, Lot Number [id number] Exp 12-24 60 capsule 0   lisinopril (ZESTRIL) 30 MG tablet Take 1 tablet (30 mg total) by mouth daily. 90 tablet 1   melatonin 5 MG TABS Take 5 mg by mouth at bedtime.     methocarbamol (ROBAXIN) 500 MG tablet Take 1 tablet (500 mg total) by mouth every 6 (six) hours as needed for muscle spasms. 90 tablet 0     metoprolol succinate (TOPROL-XL) 25 MG 24 hr tablet Take 1 tablet (25 mg total) by mouth at bedtime. 90 tablet 1   mirabegron ER (MYRBETRIQ) 25 MG TB24 tablet Take 1 tablet (25 mg total) by mouth daily. 30 tablet 0   Multiple Vitamin (MULTIVITAMIN) tablet Take 1 tablet by mouth daily at 6 PM.     oxybutynin (DITROPAN-XL) 10 MG 24 hr tablet Take 1 tablet (10 mg total) by mouth daily. 30 tablet 11   polyethylene glycol powder (GLYCOLAX/MIRALAX) 17 GM/SCOOP powder Take 17 g by mouth 2 (two) times daily as needed. (Patient taking differently: Take 17 g by mouth at bedtime.) 3350 g 1   PREVIDENT 5000 BOOSTER PLUS 1.1 % PSTE Place 1 application onto teeth 2 (two) times daily.     rivaroxaban (XARELTO) 20 MG TABS tablet Take 1 tablet (20 mg total) by mouth daily. 90 tablet 1   rOPINIRole (REQUIP) 3 MG tablet TAKE ONE TABLET BY MOUTH THREE TIMES A DAY 270 tablet 0   simvastatin (ZOCOR) 40 MG tablet Take 1 tablet (40 mg total) by mouth daily. 90 tablet 2   tamsulosin (FLOMAX) 0.4 MG CAPS capsule Take 2 capsules (0.8 mg total) by mouth daily. 180 capsule 1   traZODone (DESYREL) 50 MG tablet Take 1 tablet  (50 mg total) by mouth at bedtime. 90 tablet 1   Pertinent GI related medications were reviewed with the patient  Inpatient Medications No current facility-administered medications for this encounter.  Current Outpatient Medications:    acetaminophen (TYLENOL) 500 MG tablet, Take 1,000 mg by mouth daily., Disp: , Rfl:    amantadine (SYMMETREL) 100 MG capsule, TAKE ONE CAPSULE BY MOUTH THREE TIMES A DAY, Disp: 270 capsule, Rfl: 0   amLODipine (NORVASC) 2.5 MG tablet, TAKE 1 TABLET (2.5 MG TOTAL) BY MOUTH DAILY AS NEEDED (BLOOD PRESSURE GREATER THAN 160/90). TAKE IN THE MORNING, Disp: 90 tablet, Rfl: 1   calcium carbonate (TUMS - DOSED IN MG ELEMENTAL CALCIUM) 500 MG chewable tablet, Chew 1 tablet by mouth daily as needed for indigestion or heartburn., Disp: , Rfl:    carbidopa-levodopa (SINEMET CR) 50-200 MG tablet, TAKE 1 TABLET BY MOUTH AT BEDTIME, Disp: 90 tablet, Rfl: 0   carbidopa-levodopa (SINEMET IR) 25-100 MG tablet, TAKE TWO TABLETS BY MOUTH FOUR TIMES A DAY AT 6AM, 10AM, 2PM. AND 6PM., Disp: 720 tablet, Rfl: 0   cetirizine (ZYRTEC) 10 MG tablet, Take 10 mg by mouth at bedtime., Disp: , Rfl:    cholecalciferol (VITAMIN D3) 25 MCG (1000 UNIT) tablet, Take 1,000 Units by mouth daily., Disp: , Rfl:    diclofenac Sodium (VOLTAREN) 1 % GEL, APPLY TOPICALLY 4 GRAMS FOUR TIMES DAILY (Patient taking differently: Apply 4 g topically 4 (four) times daily as needed (pain).), Disp: 400 g, Rfl: 1   hydrALAZINE (APRESOLINE) 25 MG tablet, Take 1 tablet (25 mg total) by mouth 3 (three) times daily as needed (for SBP >160). Takes in the evening, Disp: 90 tablet, Rfl: 3   HYDROcodone-acetaminophen (NORCO/VICODIN) 5-325 MG tablet, Take by mouth., Disp: , Rfl:    Levodopa (INBRIJA) 42 MG CAPS, Samples of this drug were given to the patient, quantity 1 box of 32 capsules, Lot Number [id number] Exp: 03/2023  Patient has been educated on dosage and uses of medication., Disp: 32 capsule, Rfl: 0   Levodopa  (INBRIJA) 42 MG CAPS, Samples of this drug were given to the patient, quantity 1, Lot Number [id number] Exp 12-24, Disp: 60 capsule, Rfl: 0     lisinopril (ZESTRIL) 30 MG tablet, Take 1 tablet (30 mg total) by mouth daily., Disp: 90 tablet, Rfl: 1   melatonin 5 MG TABS, Take 5 mg by mouth at bedtime., Disp: , Rfl:    methocarbamol (ROBAXIN) 500 MG tablet, Take 1 tablet (500 mg total) by mouth every 6 (six) hours as needed for muscle spasms., Disp: 90 tablet, Rfl: 0   metoprolol succinate (TOPROL-XL) 25 MG 24 hr tablet, Take 1 tablet (25 mg total) by mouth at bedtime., Disp: 90 tablet, Rfl: 1   mirabegron ER (MYRBETRIQ) 25 MG TB24 tablet, Take 1 tablet (25 mg total) by mouth daily., Disp: 30 tablet, Rfl: 0   Multiple Vitamin (MULTIVITAMIN) tablet, Take 1 tablet by mouth daily at 6 PM., Disp: , Rfl:    oxybutynin (DITROPAN-XL) 10 MG 24 hr tablet, Take 1 tablet (10 mg total) by mouth daily., Disp: 30 tablet, Rfl: 11   polyethylene glycol powder (GLYCOLAX/MIRALAX) 17 GM/SCOOP powder, Take 17 g by mouth 2 (two) times daily as needed. (Patient taking differently: Take 17 g by mouth at bedtime.), Disp: 3350 g, Rfl: 1   PREVIDENT 5000 BOOSTER PLUS 1.1 % PSTE, Place 1 application onto teeth 2 (two) times daily., Disp: , Rfl:    rivaroxaban (XARELTO) 20 MG TABS tablet, Take 1 tablet (20 mg total) by mouth daily., Disp: 90 tablet, Rfl: 1   rOPINIRole (REQUIP) 3 MG tablet, TAKE ONE TABLET BY MOUTH THREE TIMES A DAY, Disp: 270 tablet, Rfl: 0   simvastatin (ZOCOR) 40 MG tablet, Take 1 tablet (40 mg total) by mouth daily., Disp: 90 tablet, Rfl: 2   tamsulosin (FLOMAX) 0.4 MG CAPS capsule, Take 2 capsules (0.8 mg total) by mouth daily., Disp: 180 capsule, Rfl: 1   traZODone (DESYREL) 50 MG tablet, Take 1 tablet (50 mg total) by mouth at bedtime., Disp: 90 tablet, Rfl: 1      Objective   Vitals:   10/15/22 0925  BP: (!) 144/95  Pulse: (!) 56  Resp: 17  Temp: (!) 97.4 F (36.3 C)  TempSrc: Oral  SpO2: 100%      Physical Exam Vitals and nursing note reviewed.  Constitutional:      General: He is not in acute distress.    Appearance: He is not ill-appearing, toxic-appearing or diaphoretic.  HENT:     Head: Normocephalic and atraumatic.     Nose: Nose normal.     Mouth/Throat:     Mouth: Mucous membranes are moist.     Pharynx: Oropharynx is clear.  Eyes:     General: No scleral icterus.    Extraocular Movements: Extraocular movements intact.  Cardiovascular:     Rate and Rhythm: Regular rhythm. Bradycardia present.  Pulmonary:     Effort: Pulmonary effort is normal. No respiratory distress.     Breath sounds: Normal breath sounds. No wheezing, rhonchi or rales.  Abdominal:     General: Bowel sounds are normal. There is distension.     Palpations: Abdomen is soft.     Tenderness: There is no abdominal tenderness. There is no guarding or rebound.     Comments: Tympanic.  Non peritoneal. No rigidity  Musculoskeletal:     Cervical back: Neck supple.     Right lower leg: No edema.     Left lower leg: No edema.  Skin:    General: Skin is warm and dry.     Coloration: Skin is not jaundiced or pale.  Neurological:     Mental Status: He   is alert and oriented to person, place, and time. Mental status is at baseline.     Laboratory Data Recent Labs  Lab 10/15/22 0931  WBC 7.2  HGB 14.2  HCT 43.4  PLT 175   Recent Labs  Lab 10/15/22 0931  NA 140  K 3.8  CL 105  CO2 25  BUN 23  CALCIUM 9.0  PROT 7.0  BILITOT 1.0  ALKPHOS 79  ALT 6  AST 14*  GLUCOSE 111*   No results for input(s): "INR" in the last 168 hours.  Recent Labs    10/15/22 0931  LIPASE 43        Imaging Studies: CT ABDOMEN PELVIS W CONTRAST  Result Date: 10/15/2022 CLINICAL DATA:  Left lower quadrant pain EXAM: CT ABDOMEN AND PELVIS WITH CONTRAST TECHNIQUE: Multidetector CT imaging of the abdomen and pelvis was performed using the standard protocol following bolus administration of intravenous  contrast. RADIATION DOSE REDUCTION: This exam was performed according to the departmental dose-optimization program which includes automated exposure control, adjustment of the mA and/or kV according to patient size and/or use of iterative reconstruction technique. CONTRAST:  100mL OMNIPAQUE IOHEXOL 300 MG/ML  SOLN COMPARISON:  None Available. FINDINGS: Lower chest: There is some linear opacity lung bases likely scar or atelectasis. No pleural effusion. Coronary artery calcifications are seen. Patulous lower esophagus. Hepatobiliary: No focal liver abnormality is seen. No gallstones, gallbladder wall thickening, or biliary dilatation. Small appearing left hepatic lobe. Pancreas: Unremarkable. No pancreatic ductal dilatation or surrounding inflammatory changes. Spleen: Normal in size without focal abnormality. Adrenals/Urinary Tract: Adrenal glands are preserved. No enhancing renal mass or collecting system dilatation. There is parapelvic left-sided renal cyst. The ureters have normal course and caliber extending down to the bladder. Preserved contours of the urinary bladder. Stomach/Bowel: Subtle wall thickening along the rectum. Just proximal this there is a twisting of the mesentery as best appreciated on the coronal data set with swirling of the distal sigmoid colon the sigmoid colon extends into the right upper quadrant, up to the level of the diaphragm and is distended with a diameter approaching 6.5 cm, mildly dilated. This could be a sigmoid volvulus with only partial obstruction. Focal caliber change identified at the swirling, twisting location in the upper central pelvis as seen on axial series 2, image 61. The more proximal portion of the bowel to this with a large amount of stool but no dilatation. Appendix is not well seen. The stomach and small bowel are nondilated. Vascular/Lymphatic: Aortic atherosclerosis. No enlarged abdominal or pelvic lymph nodes. Reproductive: Prostate is unremarkable. Other: No  free air or free fluid. No portal venous gas or pneumatosis. Small fat containing inguinal hernias. Musculoskeletal: Moderate degenerative changes of the spine and pelvis. There is grade 2 anterolisthesis with pars defects at L4-5. There is fusion across the disc space. Critical Value/emergent results were called by telephone at the time of interpretation on 10/15/2022 at 9:21 am to provider SEBASTIAN SIADECKI , who verbally acknowledged these results. IMPRESSION: Twisting appearance to the mesentery in the upper central pelvis and along the distal sigmoid colon with a caliber change at the level of the twist and a mildly dilated appearance to the sigmoid colon extending up to the diaphragm. Based on the appearance this could represent early changes of sigmoid volvulus with a partial obstruction. No inflammatory changes, pneumatosis or more significant bowel dilatation proximal. Please correlate with clinical presentation. Of note proximal this there is a large amount of stool in the colon. There   is also some slight wall thickening along the rectum with some stranding. Please correlate for proctitis. Electronically Signed   By: Ashok  Gupta M.D.   On: 10/15/2022 12:25    Assessment:    # Sigmoid Volvulus -Patient with Parkinson's opioid use and chronic constipation  # abdominal pain and diarrhea 2/2 above  # Personal history of DVT and PE -On Xarelto last dose last night  # Parkinson's disease  #A-fib  # HTN  #Generalized debility  # chronic opioid use for back pain  # TIA  Plan:  Flexible sigmoidoscopy planned for today for detorsion and decompression -If successful will allow for Xarelto washout prior to partial colon resection -If unsuccessful, will need sooner surgery -Plan to leave decompression tube if successfully able to traverse the torsion  -Patient consumed milk and yogurt at approximately 6 AM.  Will likely need intubation for procedure  - Continue IV fluid resuscitation  and correction of electrolytes as needed  -Continue to hold Xarelto -avoid gut motility slowing agents -Wife present at bedside also consents to procedure  - Appreciate surgical assistance  Further recommendations pending endoscopy.  See op report for further details  Colonoscopy/Flexible sigmoidoscopy with possible biopsy, control of bleeding, polypectomy, and interventions as necessary has been discussed with the patient/patient representative. Informed consent was obtained from the patient/patient representative after explaining the indication, nature, and risks of the procedure including but not limited to death, bleeding, perforation, missed neoplasm/lesions, cardiorespiratory compromise, and reaction to medications. Opportunity for questions was given and appropriate answers were provided. Patient/patient representative has verbalized understanding is amenable to undergoing the procedure.   I personally performed the service.  Management of other medical comorbidities as per primary team  Thank you for allowing us to participate in this patient's care. Please don't hesitate to call if any questions or concerns arise.   Xzaviar Maloof Michael Murle Otting, DO [facility]  Portions of the record may have been created with voice recognition software. Occasional wrong-word or 'sound-a-like' substitutions may have occurred due to the inherent limitations of voice recognition software.  Read the chart carefully and recognize, using context, where substitutions may have occurred.  

## 2022-10-15 NOTE — Anesthesia Preprocedure Evaluation (Addendum)
Anesthesia Evaluation  Patient identified by MRN, date of birth, ID band Patient awake    Reviewed: Allergy & Precautions, NPO status , Patient's Chart, lab work & pertinent test results  History of Anesthesia Complications (+) PONV and history of anesthetic complications  Airway Mallampati: III   Neck ROM: Full    Dental  (+) Missing   Pulmonary neg pulmonary ROS   Pulmonary exam normal breath sounds clear to auscultation       Cardiovascular hypertension, Normal cardiovascular exam+ dysrhythmias (a fib)  Rhythm:Regular Rate:Normal     Neuro/Psych  PSYCHIATRIC DISORDERS  Depression    Parkinson disease TIA (2007)   GI/Hepatic negative GI ROS,,,  Endo/Other  Obesity   Renal/GU Renal disease (nephrolithiasis)   BPH    Musculoskeletal  (+) Arthritis ,    Abdominal   Peds  Hematology APC resistance with hx LLE DVT on Eliquis   Anesthesia Other Findings   Reproductive/Obstetrics                             Anesthesia Physical Anesthesia Plan  ASA: 3  Anesthesia Plan: General   Post-op Pain Management:    Induction: Intravenous  PONV Risk Score and Plan: 3 and Propofol infusion, TIVA and Treatment may vary due to age or medical condition  Airway Management Planned: Natural Airway  Additional Equipment:   Intra-op Plan:   Post-operative Plan:   Informed Consent: I have reviewed the patients History and Physical, chart, labs and discussed the procedure including the risks, benefits and alternatives for the proposed anesthesia with the patient or authorized representative who has indicated his/her understanding and acceptance.       Plan Discussed with: CRNA  Anesthesia Plan Comments: (LMA/GETA backup discussed.  Patient consented for risks of anesthesia including but not limited to:  - adverse reactions to medications - damage to eyes, teeth, lips or other oral mucosa -  nerve damage due to positioning  - sore throat or hoarseness - damage to heart, brain, nerves, lungs, other parts of body or loss of life  Informed patient about role of CRNA in peri- and intra-operative care.  Patient voiced understanding.)       Anesthesia Quick Evaluation

## 2022-10-15 NOTE — Op Note (Signed)
[facility] Patient Name: Thomas [name] Procedure Date: 10/15/2022 12:49 PM MRN: [id number] Account #: [id number] Date of Birth: December 12, 1950 Admit Type: Inpatient Age: 72 Room: [facility] ENDO ROOM 1 Gender: Male Note Status: Finalized Instrument Name: Colonoscope [id number] Procedure:             Flexible Sigmoidoscopy Indications:           For therapy of volvulus Providers:             Jaynie Collins DO, DO Referring MD:          Dorcas Carrow (Referring MD) Medicines:             Monitored Anesthesia Care Complications:         No immediate complications. Estimated blood loss: None. Procedure:             Pre-Anesthesia Assessment:                        - Prior to the procedure, a History and Physical was                         performed, and patient medications and allergies were                         reviewed. The patient is competent. The risks and                         benefits of the procedure and the sedation options and                         risks were discussed with the patient. All questions                         were answered and informed consent was obtained.                         Patient identification and proposed procedure were                         verified by the physician, the nurse, the anesthetist                         and the technician in the endoscopy suite. Mental                         Status Examination: alert and oriented. Airway                         Examination: normal oropharyngeal airway and neck                         mobility. Respiratory Examination: clear to                         auscultation. CV Examination: RRR, no murmurs, no S3                         or S4. Prophylactic Antibiotics: The patient does not  require prophylactic antibiotics. Prior                         Anticoagulants: The patient has taken Xarelto                         (rivaroxaban), last dose was  1 day prior to procedure.                         ASA Grade Assessment: III - A patient with severe                         systemic disease. After reviewing the risks and                         benefits, the patient was deemed in satisfactory                         condition to undergo the procedure. The anesthesia                         plan was to use monitored anesthesia care (MAC).                         Immediately prior to administration of medications,                         the patient was re-assessed for adequacy to receive                         sedatives. The heart rate, respiratory rate, oxygen                         saturations, blood pressure, adequacy of pulmonary                         ventilation, and response to care were monitored                         throughout the procedure. The physical status of the                         patient was re-assessed after the procedure.                        After obtaining informed consent, the scope was passed                         under direct vision. The Colonoscope was introduced                         through the anus and advanced to the the descending                         colon. The flexible sigmoidoscopy was accomplished                         without difficulty. The patient tolerated the  procedure well. No bowel preparation was given prior                         to the procedure. The quality of visualization was                         poor. Findings:      The perianal and digital rectal examinations were normal. Pertinent       negatives include normal sphincter tone.      Extensive amounts of stool was found in the rectum, in the recto-sigmoid       colon, in the sigmoid colon and in the descending colon, precluding       visualization. Estimated blood loss: none.      Scope was advanced with minimal insuffulation and large amounts of       lavage the scope was advanced in side by  side manner with the NGT which       was tied with a clip (clip was within the scope at time of advancement).       Once the transition point and point of dilation and stool was reached,       the tube was placed and secured with the clip.      The scope was then withdrawn. Tube is to gravity with foley bag.       Estimated blood loss: none.      Abdomen is soft after procedure      Retroflexion not performed to prevent dislodgment of tube/clip. Impression:            - Stool in the rectum, in the recto-sigmoid colon, in                         the sigmoid colon and in the descending colon.                        - No specimens collected. Recommendation:        - Return patient to hospital ward for ongoing care.                        - NPO.                        - Surgery following.                        Will perform KUB to assess for decompression and tube                         placement.                        Tube may become dislodged and come out on it's own                         prior to surgery.                        - Surgery following with plan for partial colon                         resection after Xarelto washout  if decompression                         remains.                        - The findings and recommendations were discussed with                         the patient's family.                        - The findings and recommendations were discussed with                         the patient. Procedure Code(s):     --- Professional ---                        [phone], Sigmoidoscopy, flexible; diagnostic, including                         collection of specimen(s) by brushing or washing, when                         performed (separate procedure) Diagnosis Code(s):     --- Professional ---                        J47.8, Volvulus CPT copyright 2022 [employer]. All rights reserved. The codes documented in this report are preliminary and upon coder  review may  be revised to meet current compliance requirements. Attending Participation:      I personally performed the entire procedure. Elfredia Nevins, DO Jaynie Collins DO, DO 10/15/2022 2:59:39 PM This report has been signed electronically. Number of Addenda: 0 Note Initiated On: 10/15/2022 12:49 PM Total Procedure Duration: 0 hours 27 minutes 48 seconds  Estimated Blood Loss:  Estimated blood loss: none.      [facility]

## 2022-10-15 NOTE — Progress Notes (Signed)
10/15/2022  Subjective: Reassessed patient after flexible sigmoidscopy for sigmoid volvulus. He denies any pain or discomfort, and only reports some drowsiness.    Vital signs: Temp:  [97.4 F (36.3 C)-97.9 F (36.6 C)] 97.9 F (36.6 C) (07/03 1444) Pulse Rate:  [51-67] 67 (07/03 1651) Resp:  [12-19] 18 (07/03 1651) BP: (121-197)/(72-101) 188/97 (07/03 1730) SpO2:  [94 %-100 %] 97 % (07/03 1651) Weight:  [99.8 kg] 99.8 kg (07/03 1642)   Intake/Output: No intake/output data recorded.    Physical Exam: Constitutional:  No acute distress Abdomen:  soft, non-distended, non-tender to palpation.  Certainly without any peritonitis.  Rectal tube in place with liquid stool in bag.  Labs:  Recent Labs    10/15/22 0931  WBC 7.2  HGB 14.2  HCT 43.4  PLT 175   Recent Labs    10/15/22 0931  NA 140  K 3.8  CL 105  CO2 25  GLUCOSE 111*  BUN 23  CREATININE 1.09  CALCIUM 9.0   Recent Labs    10/15/22 1643  LABPROT 17.0*  INR 1.4*    Imaging: DG Abd 1 View  Result Date: 10/15/2022 CLINICAL DATA:  Sigmoid volvulus.  Rectal tube placement EXAM: ABDOMEN - 1 VIEW COMPARISON:  CT earlier 10/15/2018 FINDINGS: Presumed rectal tube in place with the tip extending into the epigastric region in the coursing to the left midabdomen. There continues to be some dilated colon with a large amount of right-sided colonic stool. Level of dilatation of colonic loops appears similar to the prior CT. Small bowel is nondilated. No definite free air on these portable supine radiographs. Curvature and degenerative changes of the spine. Osteopenia. There is some contrast in the renal collecting systems and bladder from prior CT scan IMPRESSION: Placement of rectal tube. Continued distention of colon with significant right-sided stool Electronically Signed   By: Karen Kays M.D.   On: 10/15/2022 16:24   CT ABDOMEN PELVIS W CONTRAST  Result Date: 10/15/2022 CLINICAL DATA:  Left lower quadrant pain EXAM: CT  ABDOMEN AND PELVIS WITH CONTRAST TECHNIQUE: Multidetector CT imaging of the abdomen and pelvis was performed using the standard protocol following bolus administration of intravenous contrast. RADIATION DOSE REDUCTION: This exam was performed according to the departmental dose-optimization program which includes automated exposure control, adjustment of the mA and/or kV according to patient size and/or use of iterative reconstruction technique. CONTRAST:  OMNIPAQUE IOHEXOL 300 MG/ML  SOLN COMPARISON:  None Available. FINDINGS: Lower chest: There is some linear opacity lung bases likely scar or atelectasis. No pleural effusion. Coronary artery calcifications are seen. Patulous lower esophagus. Hepatobiliary: No focal liver abnormality is seen. No gallstones, gallbladder wall thickening, or biliary dilatation. Small appearing left hepatic lobe. Pancreas: Unremarkable. No pancreatic ductal dilatation or surrounding inflammatory changes. Spleen: Normal in size without focal abnormality. Adrenals/Urinary Tract: Adrenal glands are preserved. No enhancing renal mass or collecting system dilatation. There is parapelvic left-sided renal cyst. The ureters have normal course and caliber extending down to the bladder. Preserved contours of the urinary bladder. Stomach/Bowel: Subtle wall thickening along the rectum. Just proximal this there is a twisting of the mesentery as best appreciated on the coronal data set with swirling of the distal sigmoid colon the sigmoid colon extends into the right upper quadrant, up to the level of the diaphragm and is distended with a diameter approaching 6.5 cm, mildly dilated. This could be a sigmoid volvulus with only partial obstruction. Focal caliber change identified at the swirling, twisting location  in the upper central pelvis as seen on axial series 2, image 61. The more proximal portion of the bowel to this with a large amount of stool but no dilatation. Appendix is not well seen.  The stomach and small bowel are nondilated. Vascular/Lymphatic: Aortic atherosclerosis. No enlarged abdominal or pelvic lymph nodes. Reproductive: Prostate is unremarkable. Other: No free air or free fluid. No portal venous gas or pneumatosis. Small fat containing inguinal hernias. Musculoskeletal: Moderate degenerative changes of the spine and pelvis. There is grade 2 anterolisthesis with pars defects at L4-5. There is fusion across the disc space. Critical Value/emergent results were called by telephone at the time of interpretation on 10/15/2022 at 9:21 am to provider [facility] , who verbally acknowledged these results. IMPRESSION: Twisting appearance to the mesentery in the upper central pelvis and along the distal sigmoid colon with a caliber change at the level of the twist and a mildly dilated appearance to the sigmoid colon extending up to the diaphragm. Based on the appearance this could represent early changes of sigmoid volvulus with a partial obstruction. No inflammatory changes, pneumatosis or more significant bowel dilatation proximal. Please correlate with clinical presentation. Of note proximal this there is a large amount of stool in the colon. There is also some slight wall thickening along the rectum with some stranding. Please correlate for proctitis. Electronically Signed   By: Karen Kays M.D.   On: 10/15/2022 12:25    Assessment/Plan: This is a 72 y.o. male with sigmoid volvulus, s/p flex sig decompression  --His abdomen remains soft.  KUB shows rectal tube following path of sigmoid colon.  On his CT scan, this portion was very distended, but on KUB, this area seems decompressed.  For now, discussed that we can proceed as planned with sigmoidectomy on 10/17/22, allowing his Xarelto to wash out and allowing a bowel prep to increase the possibility of a successful anastomosis instead of committing to an ostomy.  Discussed with him that if there are any changes or any worsening in  clinical condition, may need to address surgery sooner.  Discussed that we'd repeat KUB tomorrow and start bowel prep, which would include oral antibiotics and miralax. --Plan for 10/17/22 would be for an open sigmoidectomy with possible end colostomy vs anastomosis.  Discussed the risks of bleeding, infection, injury, hospital stay, post-operative activity restrictions, mobility, and he's willing to proceed. --Patient understands this plan and all of his questions have been answered.   I spent 35 minutes dedicated to the care of this patient on the date of this encounter to include pre-visit review of records, face-to-face time with the patient discussing diagnosis and management, and any post-visit coordination of care.  Howie Ill, MD [facility]

## 2022-10-15 NOTE — H&P (View-Only) (Signed)
Inpatient Consultation   Patient ID: Thomas [name] is a 72 y.o. male.  Requesting Provider: Dionne Bucy, MD (EM)  Date of Admission: 10/15/2022  Date of Consult: 10/15/22   Reason for Consultation: sigmoid volvulus   Patient's Chief Complaint:   Chief Complaint  Patient presents with   Abdominal Pain    72 year old Caucasian male with history of Parkinson's disease, history of DVT and PE on Xarelto, A-fib, constipation, chronic opioid use, history of TIA who presents to the hospital with 5 days of lower abdominal pain and diarrhea.  GI is consulted for sigmoid volvulus.  His wife is at bedside who helps provide history.  Patient presented after 5 days of the above symptoms.  He denies any melena or hematochezia.  No nausea or vomiting.  His symptoms are becoming worse over the last few days which prompted him to come to the emergency department. His wife notes some loose liquid stools that have been clear in nature.  He frequently deals with constipation at home due to risk Parkinson's and opioid use.  He uses MiraLAX at home for this.  CT revealed sigmoid volvulus.  General surgery also at bedside at time of evaluation.  No other upper GI symptoms.  Last dose of Xarelto last night.  Denies NSAIDs, Anti-plt agents Denies family history of gastrointestinal disease and malignancy Previous Endoscopies:  07/27/2019- Colonoscopy- difficult, poor prep. Polyps present.     Past Medical History:  Diagnosis Date   Activated protein C resistance 06/28/2019   Arthritis of knee    Arthropathy of lumbar facet joint    Atrial fibrillation    BPH (benign prostatic hyperplasia) 09/10/2018   DVT (deep venous thrombosis)    L leg   Dysplastic nevus 05/15/2021   Left Superior Pectoral, mod to severe, Excised 07/16/21   Erectile dysfunction 03/01/2015   Herniated lumbar disc without myelopathy 10/11/2020   Hyperchloremia    Hypercholesterolemia    Hypertension    Inability  to walk 02/22/2021   Insomnia due to medical condition 09/30/2018   Major depressive disorder 01/22/2022   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   Nephrolithiasis    Osteoarthritis    Parkinson's disease    Plantar wart    Pneumonia    Post-operative nausea and vomiting 09/20/2020   Pulmonary embolism    Sciatica    Seasonal allergies    TIA (transient ischemic attack)     Past Surgical History:  Procedure Laterality Date   APPENDECTOMY     CATARACT EXTRACTION Bilateral 10/2019 11/2019   COLONOSCOPY WITH PROPOFOL N/A 07/27/2019   Procedure: COLONOSCOPY WITH PROPOFOL;  Surgeon: Toney Reil, MD;  Location: [facility];  Service: Endoscopy;  Laterality: N/A;   EYE SURGERY     KNEE ARTHROSCOPY WITH MENISCAL REPAIR Right 2009   KNEE SURGERY Right    LUMBAR LAMINECTOMY/DECOMPRESSION MICRODISCECTOMY Left 10/11/2020   Procedure: Left Lumbar Three-Four, Lumbar Fou-Five  Microdiscectomy;  Surgeon: Maeola Harman, MD;  Location: [facility] OR;  Service: Neurosurgery;  Laterality: Left;   melanoma removal Left 07/16/2021   chest   T&A     TONSILLECTOMY      Allergies  Allergen Reactions   Codeine Other (See Comments)    Hallucinations   Other Nausea Only    General anesthesia     Family History  Problem Relation Age of Onset   Parkinson's disease Father    Coronary artery disease Mother    Heart attack Sister  Deep vein thrombosis Sister     Social History   Tobacco Use   Smoking status: Never   Smokeless tobacco: Never  Vaping Use   Vaping Use: Never used  Substance Use Topics   Alcohol use: No    Alcohol/week: 0.0 standard drinks of alcohol   Drug use: No     Pertinent GI related history and allergies were reviewed with the patient  Review of Systems  Constitutional:  Negative for activity change, appetite change, chills, diaphoresis, fatigue, fever and unexpected weight change.  HENT:  Negative for trouble swallowing and voice  change.   Respiratory:  Negative for shortness of breath and wheezing.   Cardiovascular:  Negative for chest pain, palpitations and leg swelling.  Gastrointestinal:  Positive for abdominal pain and diarrhea. Negative for abdominal distention, anal bleeding, blood in stool, constipation, nausea and vomiting.  Musculoskeletal:  Negative for arthralgias and myalgias.  Skin:  Negative for color change and pallor.  Neurological:  Negative for dizziness, syncope and weakness.  Psychiatric/Behavioral:  Negative for confusion. The patient is not nervous/anxious.   All other systems reviewed and are negative.    Medications Home Medications No current facility-administered medications on file prior to encounter.   Current Outpatient Medications on File Prior to Encounter  Medication Sig Dispense Refill   acetaminophen (TYLENOL) 500 MG tablet Take 1,000 mg by mouth daily.     amantadine (SYMMETREL) 100 MG capsule TAKE ONE CAPSULE BY MOUTH THREE TIMES A DAY 270 capsule 0   amLODipine (NORVASC) 2.5 MG tablet TAKE 1 TABLET (2.5 MG TOTAL) BY MOUTH DAILY AS NEEDED (BLOOD PRESSURE GREATER THAN 160/90). TAKE IN THE MORNING 90 tablet 1   calcium carbonate (TUMS - DOSED IN MG ELEMENTAL CALCIUM) 500 MG chewable tablet Chew 1 tablet by mouth daily as needed for indigestion or heartburn.     carbidopa-levodopa (SINEMET CR) 50-200 MG tablet TAKE 1 TABLET BY MOUTH AT BEDTIME 90 tablet 0   carbidopa-levodopa (SINEMET IR) 25-100 MG tablet TAKE TWO TABLETS BY MOUTH FOUR TIMES A DAY AT 6AM, 10AM, 2PM. AND 6PM. 720 tablet 0   cetirizine (ZYRTEC) 10 MG tablet Take 10 mg by mouth at bedtime.     cholecalciferol (VITAMIN D3) 25 MCG (1000 UNIT) tablet Take 1,000 Units by mouth daily.     diclofenac Sodium (VOLTAREN) 1 % GEL APPLY TOPICALLY 4 GRAMS FOUR TIMES DAILY (Patient taking differently: Apply 4 g topically 4 (four) times daily as needed (pain).) 400 g 1   hydrALAZINE (APRESOLINE) 25 MG tablet Take 1 tablet (25 mg  total) by mouth 3 (three) times daily as needed (for SBP >160). Takes in the evening 90 tablet 3   HYDROcodone-acetaminophen (NORCO/VICODIN) 5-325 MG tablet Take by mouth.     Levodopa (INBRIJA) 42 MG CAPS Samples of this drug were given to the patient, quantity 1 box of 32 capsules, Lot Number [id number] Exp: 03/2023  Patient has been educated on dosage and uses of medication. 32 capsule 0   Levodopa (INBRIJA) 42 MG CAPS Samples of this drug were given to the patient, quantity 1, Lot Number [id number] Exp 12-24 60 capsule 0   lisinopril (ZESTRIL) 30 MG tablet Take 1 tablet (30 mg total) by mouth daily. 90 tablet 1   melatonin 5 MG TABS Take 5 mg by mouth at bedtime.     methocarbamol (ROBAXIN) 500 MG tablet Take 1 tablet (500 mg total) by mouth every 6 (six) hours as needed for muscle spasms. 90 tablet 0  metoprolol succinate (TOPROL-XL) 25 MG 24 hr tablet Take 1 tablet (25 mg total) by mouth at bedtime. 90 tablet 1   mirabegron ER (MYRBETRIQ) 25 MG TB24 tablet Take 1 tablet (25 mg total) by mouth daily. 30 tablet 0   Multiple Vitamin (MULTIVITAMIN) tablet Take 1 tablet by mouth daily at 6 PM.     oxybutynin (DITROPAN-XL) 10 MG 24 hr tablet Take 1 tablet (10 mg total) by mouth daily. 30 tablet 11   polyethylene glycol powder (GLYCOLAX/MIRALAX) 17 GM/SCOOP powder Take 17 g by mouth 2 (two) times daily as needed. (Patient taking differently: Take 17 g by mouth at bedtime.) 3350 g 1   PREVIDENT 5000 BOOSTER PLUS 1.1 % PSTE Place 1 application onto teeth 2 (two) times daily.     rivaroxaban (XARELTO) 20 MG TABS tablet Take 1 tablet (20 mg total) by mouth daily. 90 tablet 1   rOPINIRole (REQUIP) 3 MG tablet TAKE ONE TABLET BY MOUTH THREE TIMES A DAY 270 tablet 0   simvastatin (ZOCOR) 40 MG tablet Take 1 tablet (40 mg total) by mouth daily. 90 tablet 2   tamsulosin (FLOMAX) 0.4 MG CAPS capsule Take 2 capsules (0.8 mg total) by mouth daily. 180 capsule 1   traZODone (DESYREL) 50 MG tablet Take 1 tablet  (50 mg total) by mouth at bedtime. 90 tablet 1   Pertinent GI related medications were reviewed with the patient  Inpatient Medications No current facility-administered medications for this encounter.  Current Outpatient Medications:    acetaminophen (TYLENOL) 500 MG tablet, Take 1,000 mg by mouth daily., Disp: , Rfl:    amantadine (SYMMETREL) 100 MG capsule, TAKE ONE CAPSULE BY MOUTH THREE TIMES A DAY, Disp: 270 capsule, Rfl: 0   amLODipine (NORVASC) 2.5 MG tablet, TAKE 1 TABLET (2.5 MG TOTAL) BY MOUTH DAILY AS NEEDED (BLOOD PRESSURE GREATER THAN 160/90). TAKE IN THE MORNING, Disp: 90 tablet, Rfl: 1   calcium carbonate (TUMS - DOSED IN MG ELEMENTAL CALCIUM) 500 MG chewable tablet, Chew 1 tablet by mouth daily as needed for indigestion or heartburn., Disp: , Rfl:    carbidopa-levodopa (SINEMET CR) 50-200 MG tablet, TAKE 1 TABLET BY MOUTH AT BEDTIME, Disp: 90 tablet, Rfl: 0   carbidopa-levodopa (SINEMET IR) 25-100 MG tablet, TAKE TWO TABLETS BY MOUTH FOUR TIMES A DAY AT 6AM, 10AM, 2PM. AND 6PM., Disp: 720 tablet, Rfl: 0   cetirizine (ZYRTEC) 10 MG tablet, Take 10 mg by mouth at bedtime., Disp: , Rfl:    cholecalciferol (VITAMIN D3) 25 MCG (1000 UNIT) tablet, Take 1,000 Units by mouth daily., Disp: , Rfl:    diclofenac Sodium (VOLTAREN) 1 % GEL, APPLY TOPICALLY 4 GRAMS FOUR TIMES DAILY (Patient taking differently: Apply 4 g topically 4 (four) times daily as needed (pain).), Disp: 400 g, Rfl: 1   hydrALAZINE (APRESOLINE) 25 MG tablet, Take 1 tablet (25 mg total) by mouth 3 (three) times daily as needed (for SBP >160). Takes in the evening, Disp: 90 tablet, Rfl: 3   HYDROcodone-acetaminophen (NORCO/VICODIN) 5-325 MG tablet, Take by mouth., Disp: , Rfl:    Levodopa (INBRIJA) 42 MG CAPS, Samples of this drug were given to the patient, quantity 1 box of 32 capsules, Lot Number [id number] Exp: 03/2023  Patient has been educated on dosage and uses of medication., Disp: 32 capsule, Rfl: 0   Levodopa  (INBRIJA) 42 MG CAPS, Samples of this drug were given to the patient, quantity 1, Lot Number [id number] Exp 12-24, Disp: 60 capsule, Rfl: 0  lisinopril (ZESTRIL) 30 MG tablet, Take 1 tablet (30 mg total) by mouth daily., Disp: 90 tablet, Rfl: 1   melatonin 5 MG TABS, Take 5 mg by mouth at bedtime., Disp: , Rfl:    methocarbamol (ROBAXIN) 500 MG tablet, Take 1 tablet (500 mg total) by mouth every 6 (six) hours as needed for muscle spasms., Disp: 90 tablet, Rfl: 0   metoprolol succinate (TOPROL-XL) 25 MG 24 hr tablet, Take 1 tablet (25 mg total) by mouth at bedtime., Disp: 90 tablet, Rfl: 1   mirabegron ER (MYRBETRIQ) 25 MG TB24 tablet, Take 1 tablet (25 mg total) by mouth daily., Disp: 30 tablet, Rfl: 0   Multiple Vitamin (MULTIVITAMIN) tablet, Take 1 tablet by mouth daily at 6 PM., Disp: , Rfl:    oxybutynin (DITROPAN-XL) 10 MG 24 hr tablet, Take 1 tablet (10 mg total) by mouth daily., Disp: 30 tablet, Rfl: 11   polyethylene glycol powder (GLYCOLAX/MIRALAX) 17 GM/SCOOP powder, Take 17 g by mouth 2 (two) times daily as needed. (Patient taking differently: Take 17 g by mouth at bedtime.), Disp: 3350 g, Rfl: 1   PREVIDENT 5000 BOOSTER PLUS 1.1 % PSTE, Place 1 application onto teeth 2 (two) times daily., Disp: , Rfl:    rivaroxaban (XARELTO) 20 MG TABS tablet, Take 1 tablet (20 mg total) by mouth daily., Disp: 90 tablet, Rfl: 1   rOPINIRole (REQUIP) 3 MG tablet, TAKE ONE TABLET BY MOUTH THREE TIMES A DAY, Disp: 270 tablet, Rfl: 0   simvastatin (ZOCOR) 40 MG tablet, Take 1 tablet (40 mg total) by mouth daily., Disp: 90 tablet, Rfl: 2   tamsulosin (FLOMAX) 0.4 MG CAPS capsule, Take 2 capsules (0.8 mg total) by mouth daily., Disp: 180 capsule, Rfl: 1   traZODone (DESYREL) 50 MG tablet, Take 1 tablet (50 mg total) by mouth at bedtime., Disp: 90 tablet, Rfl: 1      Objective   Vitals:   10/15/22 0925  BP: (!) 144/95  Pulse: (!) 56  Resp: 17  Temp: (!) 97.4 F (36.3 C)  TempSrc: Oral  SpO2: 100%      Physical Exam Vitals and nursing note reviewed.  Constitutional:      General: He is not in acute distress.    Appearance: He is not ill-appearing, toxic-appearing or diaphoretic.  HENT:     Head: Normocephalic and atraumatic.     Nose: Nose normal.     Mouth/Throat:     Mouth: Mucous membranes are moist.     Pharynx: Oropharynx is clear.  Eyes:     General: No scleral icterus.    Extraocular Movements: Extraocular movements intact.  Cardiovascular:     Rate and Rhythm: Regular rhythm. Bradycardia present.  Pulmonary:     Effort: Pulmonary effort is normal. No respiratory distress.     Breath sounds: Normal breath sounds. No wheezing, rhonchi or rales.  Abdominal:     General: Bowel sounds are normal. There is distension.     Palpations: Abdomen is soft.     Tenderness: There is no abdominal tenderness. There is no guarding or rebound.     Comments: Tympanic.  Non peritoneal. No rigidity  Musculoskeletal:     Cervical back: Neck supple.     Right lower leg: No edema.     Left lower leg: No edema.  Skin:    General: Skin is warm and dry.     Coloration: Skin is not jaundiced or pale.  Neurological:     Mental Status: He  is alert and oriented to person, place, and time. Mental status is at baseline.     Laboratory Data Recent Labs  Lab 10/15/22 0931  WBC 7.2  HGB 14.2  HCT 43.4  PLT 175   Recent Labs  Lab 10/15/22 0931  NA 140  K 3.8  CL 105  CO2 25  BUN 23  CALCIUM 9.0  PROT 7.0  BILITOT 1.0  ALKPHOS 79  ALT 6  AST 14*  GLUCOSE 111*   No results for input(s): "INR" in the last 168 hours.  Recent Labs    10/15/22 0931  LIPASE 43        Imaging Studies: CT ABDOMEN PELVIS W CONTRAST  Result Date: 10/15/2022 CLINICAL DATA:  Left lower quadrant pain EXAM: CT ABDOMEN AND PELVIS WITH CONTRAST TECHNIQUE: Multidetector CT imaging of the abdomen and pelvis was performed using the standard protocol following bolus administration of intravenous  contrast. RADIATION DOSE REDUCTION: This exam was performed according to the departmental dose-optimization program which includes automated exposure control, adjustment of the mA and/or kV according to patient size and/or use of iterative reconstruction technique. CONTRAST:  OMNIPAQUE IOHEXOL 300 MG/ML  SOLN COMPARISON:  None Available. FINDINGS: Lower chest: There is some linear opacity lung bases likely scar or atelectasis. No pleural effusion. Coronary artery calcifications are seen. Patulous lower esophagus. Hepatobiliary: No focal liver abnormality is seen. No gallstones, gallbladder wall thickening, or biliary dilatation. Small appearing left hepatic lobe. Pancreas: Unremarkable. No pancreatic ductal dilatation or surrounding inflammatory changes. Spleen: Normal in size without focal abnormality. Adrenals/Urinary Tract: Adrenal glands are preserved. No enhancing renal mass or collecting system dilatation. There is parapelvic left-sided renal cyst. The ureters have normal course and caliber extending down to the bladder. Preserved contours of the urinary bladder. Stomach/Bowel: Subtle wall thickening along the rectum. Just proximal this there is a twisting of the mesentery as best appreciated on the coronal data set with swirling of the distal sigmoid colon the sigmoid colon extends into the right upper quadrant, up to the level of the diaphragm and is distended with a diameter approaching 6.5 cm, mildly dilated. This could be a sigmoid volvulus with only partial obstruction. Focal caliber change identified at the swirling, twisting location in the upper central pelvis as seen on axial series 2, image 61. The more proximal portion of the bowel to this with a large amount of stool but no dilatation. Appendix is not well seen. The stomach and small bowel are nondilated. Vascular/Lymphatic: Aortic atherosclerosis. No enlarged abdominal or pelvic lymph nodes. Reproductive: Prostate is unremarkable. Other: No  free air or free fluid. No portal venous gas or pneumatosis. Small fat containing inguinal hernias. Musculoskeletal: Moderate degenerative changes of the spine and pelvis. There is grade 2 anterolisthesis with pars defects at L4-5. There is fusion across the disc space. Critical Value/emergent results were called by telephone at the time of interpretation on 10/15/2022 at 9:21 am to provider [facility] , who verbally acknowledged these results. IMPRESSION: Twisting appearance to the mesentery in the upper central pelvis and along the distal sigmoid colon with a caliber change at the level of the twist and a mildly dilated appearance to the sigmoid colon extending up to the diaphragm. Based on the appearance this could represent early changes of sigmoid volvulus with a partial obstruction. No inflammatory changes, pneumatosis or more significant bowel dilatation proximal. Please correlate with clinical presentation. Of note proximal this there is a large amount of stool in the colon. There  is also some slight wall thickening along the rectum with some stranding. Please correlate for proctitis. Electronically Signed   By: Karen Kays M.D.   On: 10/15/2022 12:25    Assessment:    # Sigmoid Volvulus -Patient with Parkinson's opioid use and chronic constipation  # abdominal pain and diarrhea 2/2 above  # Personal history of DVT and PE -On Xarelto last dose last night  # Parkinson's disease  #A-fib  # HTN  #Generalized debility  # chronic opioid use for back pain  # TIA  Plan:  Flexible sigmoidoscopy planned for today for detorsion and decompression -If successful will allow for Xarelto washout prior to partial colon resection -If unsuccessful, will need sooner surgery -Plan to leave decompression tube if successfully able to traverse the torsion  -Patient consumed milk and yogurt at approximately 6 AM.  Will likely need intubation for procedure  - Continue IV fluid resuscitation  and correction of electrolytes as needed  -Continue to hold Xarelto -avoid gut motility slowing agents -Wife present at bedside also consents to procedure  - Appreciate surgical assistance  Further recommendations pending endoscopy.  See op report for further details  Colonoscopy/Flexible sigmoidoscopy with possible biopsy, control of bleeding, polypectomy, and interventions as necessary has been discussed with the patient/patient representative. Informed consent was obtained from the patient/patient representative after explaining the indication, nature, and risks of the procedure including but not limited to death, bleeding, perforation, missed neoplasm/lesions, cardiorespiratory compromise, and reaction to medications. Opportunity for questions was given and appropriate answers were provided. Patient/patient representative has verbalized understanding is amenable to undergoing the procedure.   I personally performed the service.  Management of other medical comorbidities as per primary team  Thank you for allowing [place] to participate in this patient's care. Please don't hesitate to call if any questions or concerns arise.   Jaynie Collins, DO [facility]  Portions of the record may have been created with voice recognition software. Occasional wrong-word or 'sound-a-like' substitutions may have occurred due to the inherent limitations of voice recognition software.  Read the chart carefully and recognize, using context, where substitutions may have occurred.

## 2022-10-15 NOTE — ED Notes (Signed)
Gi nurses at bedside, report given, pt to GI suite.

## 2022-10-15 NOTE — Transfer of Care (Signed)
Immediate Anesthesia Transfer of Care Note  Patient: Thomas [name]  Procedure(s) Performed: FLEXIBLE SIGMOIDOSCOPY  Patient Location: Endoscopy Unit  Anesthesia Type:General  Level of Consciousness: drowsy  Airway & Oxygen Therapy: Patient Spontanous Breathing  Post-op Assessment: Report given to RN and Post -op Vital signs reviewed and stable  Post vital signs: Reviewed and stable  Last Vitals:  Vitals Value Taken Time  BP 121/72 10/15/22 1444  Temp 36.6 C 10/15/22 1444  Pulse 57 10/15/22 1444  Resp 15 10/15/22 1444  SpO2 97 % 10/15/22 1444  Vitals shown include unvalidated device data.  Last Pain:  Vitals:   10/15/22 1444  TempSrc: Temporal  PainSc: Asleep         Complications: No notable events documented.

## 2022-10-15 NOTE — ED Triage Notes (Signed)
Pt comes with c/o diarrhea and abdominal pain for 3 days. Pt states lower belly pain. Pt states diarrhea is clear.

## 2022-10-15 NOTE — ED Provider Notes (Signed)
[facility] Provider Note    Event Date/Time   First MD Initiated Contact with Patient 10/15/22 [phone]     (approximate)   History   Abdominal Pain   HPI  Thomas [name] is a 72 y.o. male with a history of hypertension, hypercholesterolemia, atrial fibrillation, DVT, TIA, BPH, and Parkinson's who presents with lower abdominal pain and diarrhea.  The patient states that he frequently has constipation associated with his Parkinson's disease.  He was constipated last week and then used an enema and was taking some laxatives.  He subsequently has had persistent watery diarrhea for the last 5 days.  He denies any blood in the stool.  He has had some lightheadedness and had a low blood pressure reading with a systolic of 90 this morning.  He denies any nausea or vomiting.  He does have lower abdominal pain across the lower abdomen.  He has no fever or chills.  He denies any acute urinary symptoms.  I reviewed the past medical records.  The patient's most recent outpatient encounter was on 5/20 with Dr. Sherron Monday from urology for evaluation of urgency incontinence.   Physical Exam   Triage Vital Signs: ED Triage Vitals  Enc Vitals Group     BP 10/15/22 0925 (!) 144/95     Pulse Rate 10/15/22 0925 (!) 56     Resp 10/15/22 0925 17     Temp 10/15/22 0925 (!) 97.4 F (36.3 C)     Temp Source 10/15/22 0925 Oral     SpO2 10/15/22 0925 100 %     Weight --      Height --      Head Circumference --      Peak Flow --      Pain Score 10/15/22 0915 8     Pain Loc --      Pain Edu? --      Excl. in GC? --     Most recent vital signs: Vitals:   10/15/22 1514 10/15/22 1524  BP: (!) 180/90   Pulse: 63 64  Resp: 13 17  Temp:    SpO2: 99% 97%     General: Alert, comfortable appearing, no distress. CV:  Good peripheral perfusion.  Resp:  Normal effort.  Abd:  Soft with mild bilateral lower quadrant tenderness.  No distention.  Other:  Dry mucous  membranes.   ED Results / Procedures / Treatments   Labs (all labs ordered are listed, but only abnormal results are displayed) Labs Reviewed  COMPREHENSIVE METABOLIC PANEL - Abnormal; Notable for the following components:      Result Value   Glucose, Bld 111 (*)    AST 14 (*)    All other components within normal limits  URINALYSIS, ROUTINE W REFLEX MICROSCOPIC - Abnormal; Notable for the following components:   Color, Urine YELLOW (*)    APPearance CLEAR (*)    All other components within normal limits  LIPASE, BLOOD  CBC  LACTIC ACID, PLASMA  LACTIC ACID, PLASMA  PROTIME-INR  APTT     EKG     RADIOLOGY  CT abdomen/pelvis: I independently viewed and interpreted the images; there is a dilated colonic loop with no free air or free fluid.  Radiology report indicates findings concerning for early sigmoid volvulus.   PROCEDURES:  Critical Care performed: Yes  .Critical Care  Performed by: Dionne Bucy, MD Authorized by: Dionne Bucy, MD   Critical care provider statement:    Critical care time (minutes):  30   Critical care time was exclusive of:  Separately billable procedures and treating other patients   Critical care was necessary to treat or prevent imminent or life-threatening deterioration of the following conditions: Sigmoid volvulus with concern for bowel obstruction/ischemia.   Critical care was time spent personally by me on the following activities:  Development of treatment plan with patient or surrogate, discussions with consultants, evaluation of patient's response to treatment, examination of patient, ordering and review of laboratory studies, ordering and review of radiographic studies, ordering and performing treatments and interventions, pulse oximetry, re-evaluation of patient's condition and review of old charts   Care discussed with: admitting provider      MEDICATIONS ORDERED IN ED: Medications  0.9 %  sodium chloride infusion (  Intravenous Anesthesia Volume Adjustment 10/15/22 1440)  sodium chloride 0.9 % bolus 500 mL (0 mLs Intravenous Stopped 10/15/22 1129)  iohexol (OMNIPAQUE) 300 MG/ML solution 100 mL (100 mLs Intravenous Contrast Given 10/15/22 1147)     IMPRESSION / MDM / ASSESSMENT AND PLAN / ED COURSE  I reviewed the triage vital signs and the nursing notes.  72 year old male with PMH as noted above presents with diarrhea for the last 5 days, lower abdominal pain, as well as some lightheadedness and a low blood pressure reading today.  On exam the patient is overall well-appearing.  His vital signs are currently normal.  The abdomen is soft with mild bilateral lower quadrant discomfort.  Differential diagnosis includes, but is not limited to, diarrhea related to the laxatives, diverticulitis, colitis, enteritis, volvulus.  We will obtain lab workup, CT abdomen/pelvis, and give IV fluids.  Patient's presentation is most consistent with acute complicated illness / injury requiring diagnostic workup.  ----------------------------------------- 1:01 PM on 10/15/2022 -----------------------------------------  CT shows findings concerning for sigmoid volvulus.  The patient does acknowledge some distention in the abdomen over the last several days.  Lab workup is overall reassuring.  Electrolytes are normal.  There is no leukocytosis or anemia.  Urinalysis is negative.  I have added on a lactate.  I consulted and discussed the case with Dr. Aleen Campi from general surgery as well as with Dr. Timothy Lasso from gastroenterology who agrees to evaluate the patient for sigmoidoscopy this afternoon.  I then consulted Dr. Huel Cote from the hospitalist service; based on her discussion she agrees to evaluate the patient for admission.  FINAL CLINICAL IMPRESSION(S) / ED DIAGNOSES   Final diagnoses:  Sigmoid volvulus (HCC)     Rx / DC Orders   ED Discharge Orders     None        Note:  This document was prepared using Dragon  voice recognition software and may include unintentional dictation errors.    Dionne Bucy, MD 10/15/22 [phone]

## 2022-10-15 NOTE — Consult Note (Signed)
ANTICOAGULATION CONSULT NOTE - Initial Consult  Pharmacy Consult for Heparin Indication:  History of Afib and multiple DVT/PE (2007, 2016, 2020) on Xarelto. need to hold due to need for surgery  Allergies  Allergen Reactions   Codeine Other (See Comments)    Hallucinations   Other Nausea Only    General anesthesia     Patient Measurements:   Heparin Dosing Weight: 91.8 kg  Vital Signs: Temp: 97.9 F (36.6 C) (07/03 1444) Temp Source: Temporal (07/03 1444) BP: 180/90 (07/03 1514) Pulse Rate: 64 (07/03 1524)  Labs: Recent Labs    10/15/22 0931  HGB 14.2  HCT 43.4  PLT 175  CREATININE 1.09    CrCl cannot be calculated (Unknown ideal weight.).   Medical History: Past Medical History:  Diagnosis Date   Activated protein C resistance 06/28/2019   Arthritis of knee    Arthropathy of lumbar facet joint    Atrial fibrillation    BPH (benign prostatic hyperplasia) 09/10/2018   DVT (deep venous thrombosis)    L leg   Dysplastic nevus 05/15/2021   Left Superior Pectoral, mod to severe, Excised 07/16/21   Erectile dysfunction 03/01/2015   Herniated lumbar disc without myelopathy 10/11/2020   Hyperchloremia    Hypercholesterolemia    Hypertension    Inability to walk 02/22/2021   Insomnia due to medical condition 09/30/2018   Major depressive disorder 01/22/2022   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   Nephrolithiasis    Osteoarthritis    Parkinson's disease    Plantar wart    Pneumonia    Post-operative nausea and vomiting 09/20/2020   Pulmonary embolism    Sciatica    Seasonal allergies    TIA (transient ischemic attack)     Medications:  (Not in a hospital admission)  Scheduled:   amantadine  100 mg Oral TID   carbidopa-levodopa  1 tablet Oral QHS   carbidopa-levodopa  2 tablet Oral QID   methocarbamol  1,000 mg Oral BID   [START ON 10/16/2022] oxybutynin  10 mg Oral Daily   rOPINIRole  3 mg Oral TID   [START ON 10/16/2022]  simvastatin  40 mg Oral q1800   tamsulosin  0.8 mg Oral Daily   traZODone  50 mg Oral QHS   Infusions:  PRN: hydrALAZINE Anti-infectives (From admission, onward)    None       Assessment: 72 y.o. male with medical history significant of Parkinson's disease with mild neurocognitive disorder, hypertension, atrial fibrillation on Xarelto, hyperlipidemia, previous DVT, BPH, TIA, who presents to the ED due to abdominal pain. Last dose of rivaroxaban was 7/2. S/p sigmoidoscopy. Possible colon resection sigmoid 7/5. Will use aPTT for heparin monitoring. CBC stable.    Goal of Therapy:  aPTT 66-102 seconds Monitor platelets by anticoagulation protocol: Yes   Plan:  No bolus due to recent procedure. Start heparin infusion at 1250 units/hr Check anti-Xa level in 8 hours and daily while on heparin Continue to monitor H&H and platelets  Ronnald Ramp, PharmD, BCPS 10/15/2022,4:16 PM

## 2022-10-15 NOTE — Assessment & Plan Note (Addendum)
Patient's family reports hypotension at home.  Blood pressure since arriving in the ED has been high likely due to stress.  Will hold any further antihypertensives today and resume tomorrow if blood pressure remains stable  - Consider restarting home antihypertensives tomorrow

## 2022-10-16 ENCOUNTER — Inpatient Hospital Stay: Payer: Medicare HMO

## 2022-10-16 DIAGNOSIS — K562 Volvulus: Secondary | ICD-10-CM | POA: Diagnosis not present

## 2022-10-16 LAB — BASIC METABOLIC PANEL
Anion gap: 8 (ref 5–15)
BUN: 13 mg/dL (ref 8–23)
CO2: 22 mmol/L (ref 22–32)
Calcium: 8.6 mg/dL — ABNORMAL LOW (ref 8.9–10.3)
Chloride: 106 mmol/L (ref 98–111)
Creatinine, Ser: 0.85 mg/dL (ref 0.61–1.24)
GFR, Estimated: >60 mL/min (ref >60)
Glucose, Bld: 100 mg/dL — ABNORMAL HIGH (ref 70–99)
Potassium: 3.3 mmol/L — ABNORMAL LOW (ref 3.5–5.1)
Sodium: 136 mmol/L (ref 135–145)

## 2022-10-16 LAB — CBC WITH DIFFERENTIAL/PLATELET
Abs Immature Granulocytes: 0.02 10*3/uL (ref 0.00–0.07)
Basophils Absolute: 0 10*3/uL (ref 0.0–0.1)
Basophils Relative: 1 %
Eosinophils Absolute: 0.1 10*3/uL (ref 0.0–0.5)
Eosinophils Relative: 2 %
HCT: 41.4 % (ref 39.0–52.0)
Hemoglobin: 14.1 g/dL (ref 13.0–17.0)
Immature Granulocytes: 0 %
Lymphocytes Relative: 15 %
Lymphs Abs: 0.9 10*3/uL (ref 0.7–4.0)
MCH: 30.5 pg (ref 26.0–34.0)
MCHC: 34.1 g/dL (ref 30.0–36.0)
MCV: 89.6 fL (ref 80.0–100.0)
Monocytes Absolute: 0.5 10*3/uL (ref 0.1–1.0)
Monocytes Relative: 9 %
Neutro Abs: 4.2 10*3/uL (ref 1.7–7.7)
Neutrophils Relative %: 73 %
Platelets: 148 10*3/uL — ABNORMAL LOW (ref 150–400)
RBC: 4.62 MIL/uL (ref 4.22–5.81)
RDW: 11.8 % (ref 11.5–15.5)
WBC: 5.7 10*3/uL (ref 4.0–10.5)
nRBC: 0 % (ref 0.0–0.2)

## 2022-10-16 LAB — APTT
aPTT: 77 seconds — ABNORMAL HIGH (ref 24–36)
aPTT: 74 seconds — ABNORMAL HIGH (ref 24–36)

## 2022-10-16 LAB — HEPARIN LEVEL (UNFRACTIONATED): Heparin Unfractionated: >1.1 IU/mL — ABNORMAL HIGH (ref 0.30–0.70)

## 2022-10-16 LAB — ABO/RH: ABO/RH(D): AB POS

## 2022-10-16 MED ORDER — LACTATED RINGERS IV SOLN: INTRAVENOUS | Status: DC

## 2022-10-16 MED ORDER — KCL-LACTATED RINGERS 20 MEQ/L IV SOLN: INTRAVENOUS | Status: DC

## 2022-10-16 MED ORDER — POTASSIUM CHLORIDE 2 MEQ/ML IV SOLN
INTRAVENOUS | Status: DC
  Filled 2022-10-16 (×3): qty 1000

## 2022-10-16 NOTE — Progress Notes (Signed)
Patient had a large bowel movement despite of rectal tube, MD, gastro enterolgy and surgery made aware of, got orders to remove the tube, rectal tube was removed.

## 2022-10-16 NOTE — Progress Notes (Signed)
Subjective:  CC: Thomas [name] is a 72 y.o. male  Hospital stay day 1, 1 Day Post-Op decompression sigmoidoscopy with rectal tube placement for sigmoid volvulus  HPI: No acute issues reported overnight.  Patient states he remains comfortable but still feels slightly distended.  ROS:  General: Denies weight loss, weight gain, fatigue, fevers, chills, and night sweats. Heart: Denies chest pain, palpitations, racing heart, irregular heartbeat, leg pain or swelling, and decreased activity tolerance. Respiratory: Denies breathing difficulty, shortness of breath, wheezing, cough, and sputum. GI: Denies change in appetite, heartburn, nausea, vomiting, constipation, diarrhea, and blood in stool. GU: Denies difficulty urinating, pain with urinating, urgency, frequency, blood in urine.   Objective:   Temp:  [97.7 F (36.5 C)-98 F (36.7 C)] 97.8 F (36.6 C) (07/04 0851) Pulse Rate:  [51-67] 63 (07/04 0851) Resp:  [12-20] 20 (07/04 0851) BP: (121-197)/(72-101) 138/82 (07/04 0851) SpO2:  [94 %-100 %] 96 % (07/04 0851) Weight:  [99.8 kg-102.4 kg] 102.4 kg (07/03 1842)     Height: 5\' 9"  (175.3 cm) Weight: 102.4 kg BMI (Calculated): 33.32   Intake/Output this shift:   Intake/Output Summary (Last 24 hours) at 10/16/2022 1015 Last data filed at 10/16/2022 0501 Gross per 24 hour  Intake 1345.08 ml  Output 1525 ml  Net -179.92 ml    Constitutional :  alert, cooperative, appears stated age, and no distress  Respiratory:  clear to auscultation bilaterally  Cardiovascular:  regular rate and rhythm  Gastrointestinal: Soft, no guarding, slight distention noted on exam .   Skin: Cool and moist.   Psychiatric: Normal affect, non-agitated, not confused       LABS:     Latest Ref Rng & Units 10/16/2022    5:59 AM 10/15/2022    9:31 AM 07/28/2022   10:49 AM  CMP  Glucose 70 - 99 mg/dL 161  096  97   BUN 8 - 23 mg/dL 13  23  17    Creatinine 0.61 - 1.24 mg/dL 0.45  4.09  8.11   Sodium 135 - 145 mmol/L  136  140  138   Potassium 3.5 - 5.1 mmol/L 3.3  3.8  4.3   Chloride 98 - 111 mmol/L 106  105  103   CO2 22 - 32 mmol/L 22  25  22    Calcium 8.9 - 10.3 mg/dL 8.6  9.0  9.0   Total Protein 6.5 - 8.1 g/dL  7.0  6.8   Total Bilirubin 0.3 - 1.2 mg/dL  1.0  0.6   Alkaline Phos 38 - 126 U/L  79  96   AST 15 - 41 U/L  14  10   ALT 0 - 44 U/L  6  6       Latest Ref Rng & Units 10/16/2022    5:59 AM 10/15/2022    9:31 AM 07/28/2022   10:49 AM  CBC  WBC 4.0 - 10.5 K/uL 5.7  7.2  4.6   Hemoglobin 13.0 - 17.0 g/dL 91.4  78.2  95.6   Hematocrit 39.0 - 52.0 % 41.4  43.4  42.6   Platelets 150 - 400 K/uL 148  175  157     RADS: CLINICAL DATA:  Sigmoid volvulus.  Follow-up exam.   EXAM: ABDOMEN - 2 VIEW   COMPARISON:  10/15/2022   FINDINGS: Unchanged position of the rectal tube which terminates over the left upper quadrant of the abdomen in the expected location of the mid sigmoid colon. Gaseous distension of the sigmoid colon  has improved when compared with the original CT from 10/15/22. This currently measures approximately 7.4 cm in diameter compared with 9.1 cm previously. Persistent moderate stool burden within the right colon. Paucity of bowel gas noted within the pelvis beyond the level of the previously noted transition point at the distal sigmoid colon.   IMPRESSION: 1. Unchanged position of the rectal tube which terminates over the left upper quadrant of the abdomen in the expected location of the mid sigmoid colon. 2. Gaseous distension of the sigmoid colon has improved when compared with the original CT from 10/15/22. 3. Continued paucity of bowel gas within the rectum.     Electronically Signed   By: Signa Kell M.D.   On: 10/16/2022 07:01 Assessment:   Sigmoid volvulus status post decompressive sigmoidoscopy with rectal tube placement.  Patient remained stable with no clinical sign of recurrence and deterioration.  Okay to continue to proceed with gentle bowel prep  in preparation for sigmoidectomy tomorrow.    labs/images/medications/previous chart entries reviewed personally and relevant changes/updates noted above.

## 2022-10-16 NOTE — Progress Notes (Addendum)
Progress Note    Logic Hoock  UJW:[id number] DOB: 1950-11-08  DOA: 10/15/2022 PCP: Dorcas Carrow, DO      Brief Narrative:    Medical records reviewed and are as summarized below:  Thomas [name] is a 72 y.o. male with medical history significant of Parkinson's disease with mild neurocognitive disorder, hypertension, atrial fibrillation on Xarelto, hyperlipidemia, previous DVT, BPH, TIA, who presented to the hospital with abdominal pain and diarrhea.  He previously had constipation and took an enema for this.  Subsequently, he developed watery diarrhea that lasted approximately 5 days.      Assessment/Plan:   Principal Problem:   Sigmoid volvulus (HCC) Active Problems:   DVT (deep venous thrombosis) (HCC)   Atrial fibrillation (HCC)   Hypertension   Parkinson's disease   Body mass index is 33.34 kg/m.  (Obesity)   Sigmoid volvulus: S/p decompressive sigmoidoscopy with rectal tube placement on 10/15/2022.  He is NPO.  Restart IV fluids for hydration.  Plan for sigmoidectomy tomorrow.  Follow-up with general surgeon.   Atrial fibrillation, history of TIA: Xarelto has been held.  Continue IV heparin drip and monitor heparin level per protocol.  Plan to discontinue heparin infusion on 10/17/2022 at 3:30 AM.   History of recurrent DVT and PE in 2016 and 2020: He is on IV heparin.   Hypokalemia: Replete potassium intravenously   Parkinson's disease: Continue Sinemet, ropinirole and amantadine   Other comorbidities include hypertension, hypercholesterolemia, depression, BPH, arthritis, activated protein C resistance  Diet Order             Diet NPO time specified Except for: Sips with Meds  Diet effective now                            Consultants: Surveyor, minerals  Procedures: Decompressive sigmoidoscopy    Medications:    amantadine  100 mg Oral TID   amLODipine  2.5 mg Oral QHS   carbidopa-levodopa  1 tablet Oral  QHS   carbidopa-levodopa  2 tablet Oral QID   methocarbamol  1,000 mg Oral BID   metroNIDAZOLE  1,000 mg Oral Q8H   neomycin  1,000 mg Oral Q8H   oxybutynin  10 mg Oral Daily   rOPINIRole  3 mg Oral TID   simvastatin  40 mg Oral q1800   tamsulosin  0.8 mg Oral Daily   traZODone  50 mg Oral QHS   Continuous Infusions:  [START ON 10/17/2022] cefoTEtan (CEFOTAN) IV     heparin 1,250 Units/hr (10/16/22 1325)     Anti-infectives (From admission, onward)    Start     Dose/Rate Route Frequency Ordered Stop   10/17/22 0600  cefoTEtan (CEFOTAN) 2 g in sodium chloride 0.9 % 100 mL IVPB        2 g 200 mL/hr over 30 Minutes Intravenous On call to O.R. 10/15/22 1819 10/18/22 0559   10/16/22 0600  neomycin (MYCIFRADIN) tablet 1,000 mg        1,000 mg Oral Every 8 hours 10/15/22 1819 10/17/22 0559   10/16/22 0600  metroNIDAZOLE (FLAGYL) tablet 1,000 mg        1,000 mg Oral Every 8 hours 10/15/22 1819 10/17/22 0559              Family Communication/Anticipated D/C date and plan/Code Status   DVT prophylaxis:      Code Status: Full Code  Family Communication: None Disposition Plan: Plan to  discharge home in 4 to 5 days   Status is: Inpatient Remains inpatient appropriate because: Needs sigmoidectomy       Subjective:   Interval events noted.  No abdominal pain, nausea or vomiting  Objective:    Vitals:   10/16/22 0028 10/16/22 0348 10/16/22 0851 10/16/22 1314  BP: (!) 177/92 (!) 170/84 138/82 121/73  Pulse: 67 64 63 (!) 59  Resp: 17 16 20 20   Temp: 97.8 F (36.6 C) 97.7 F (36.5 C) 97.8 F (36.6 C) (!) 97.5 F (36.4 C)  TempSrc: Oral  Oral Oral  SpO2: 98% 97% 96% 98%  Weight:      Height:       No data found.   Intake/Output Summary (Last 24 hours) at 10/16/2022 1424 Last data filed at 10/16/2022 0501 Gross per 24 hour  Intake 1345.08 ml  Output 1525 ml  Net -179.92 ml   Filed Weights   10/15/22 1642 10/15/22 1842  Weight: 99.8 kg 102.4 kg     Exam:  GEN: NAD SKIN: Warm and dry EYES: EOMI ENT: MMM CV: RRR PULM: CTA B ABD: soft, ND, NT, +BS, + rectal tube with dark fluid CNS: AAO x 3, non focal EXT: No edema or tenderness        Data Reviewed:   I have personally reviewed following labs and imaging studies:  Labs: Labs show the following:   Basic Metabolic Panel: Recent Labs  Lab 10/15/22 0931 10/16/22 0559  NA 140 136  K 3.8 3.3*  CL 105 106  CO2 25 22  GLUCOSE 111* 100*  BUN 23 13  CREATININE 1.09 0.85  CALCIUM 9.0 8.6*   GFR Estimated Creatinine Clearance: 91.3 mL/min (by C-G formula based on SCr of 0.85 mg/dL). Liver Function Tests: Recent Labs  Lab 10/15/22 0931  AST 14*  ALT 6  ALKPHOS 79  BILITOT 1.0  PROT 7.0  ALBUMIN 4.2   Recent Labs  Lab 10/15/22 0931  LIPASE 43   No results for input(s): "AMMONIA" in the last 168 hours. Coagulation profile Recent Labs  Lab 10/15/22 1643  INR 1.4*    CBC: Recent Labs  Lab 10/15/22 0931 10/16/22 0559  WBC 7.2 5.7  NEUTROABS  --  4.2  HGB 14.2 14.1  HCT 43.4 41.4  MCV 92.5 89.6  PLT 175 148*   Cardiac Enzymes: No results for input(s): "CKTOTAL", "CKMB", "CKMBINDEX", "TROPONINI" in the last 168 hours. BNP (last 3 results) No results for input(s): "PROBNP" in the last 8760 hours. CBG: No results for input(s): "GLUCAP" in the last 168 hours. D-Dimer: No results for input(s): "DDIMER" in the last 72 hours. Hgb A1c: No results for input(s): "HGBA1C" in the last 72 hours. Lipid Profile: No results for input(s): "CHOL", "HDL", "LDLCALC", "TRIG", "CHOLHDL", "LDLDIRECT" in the last 72 hours. Thyroid function studies: No results for input(s): "TSH", "T4TOTAL", "T3FREE", "THYROIDAB" in the last 72 hours.  Invalid input(s): "FREET3" Anemia work up: No results for input(s): "VITAMINB12", "FOLATE", "FERRITIN", "TIBC", "IRON", "RETICCTPCT" in the last 72 hours. Sepsis Labs: Recent Labs  Lab 10/15/22 0931 10/15/22 1238  10/15/22 1834 10/16/22 0559  WBC 7.2  --   --  5.7  LATICACIDVEN  --  0.9 1.2  --     Microbiology No results found for this or any previous visit (from the past 240 hour(s)).  Procedures and diagnostic studies:  DG Abd 2 Views  Result Date: 10/16/2022 CLINICAL DATA:  Sigmoid volvulus.  Follow-up exam. EXAM: ABDOMEN - 2 VIEW  COMPARISON:  10/15/2022 FINDINGS: Unchanged position of the rectal tube which terminates over the left upper quadrant of the abdomen in the expected location of the mid sigmoid colon. Gaseous distension of the sigmoid colon has improved when compared with the original CT from 10/15/22. This currently measures approximately 7.4 cm in diameter compared with 9.1 cm previously. Persistent moderate stool burden within the right colon. Paucity of bowel gas noted within the pelvis beyond the level of the previously noted transition point at the distal sigmoid colon. IMPRESSION: 1. Unchanged position of the rectal tube which terminates over the left upper quadrant of the abdomen in the expected location of the mid sigmoid colon. 2. Gaseous distension of the sigmoid colon has improved when compared with the original CT from 10/15/22. 3. Continued paucity of bowel gas within the rectum. Electronically Signed   By: Signa Kell M.D.   On: 10/16/2022 07:01   DG Abd 1 View  Result Date: 10/15/2022 CLINICAL DATA:  Sigmoid volvulus.  Rectal tube placement EXAM: ABDOMEN - 1 VIEW COMPARISON:  CT earlier 10/15/2018 FINDINGS: Presumed rectal tube in place with the tip extending into the epigastric region in the coursing to the left midabdomen. There continues to be some dilated colon with a large amount of right-sided colonic stool. Level of dilatation of colonic loops appears similar to the prior CT. Small bowel is nondilated. No definite free air on these portable supine radiographs. Curvature and degenerative changes of the spine. Osteopenia. There is some contrast in the renal collecting  systems and bladder from prior CT scan IMPRESSION: Placement of rectal tube. Continued distention of colon with significant right-sided stool Electronically Signed   By: Karen Kays M.D.   On: 10/15/2022 16:24   CT ABDOMEN PELVIS W CONTRAST  Result Date: 10/15/2022 CLINICAL DATA:  Left lower quadrant pain EXAM: CT ABDOMEN AND PELVIS WITH CONTRAST TECHNIQUE: Multidetector CT imaging of the abdomen and pelvis was performed using the standard protocol following bolus administration of intravenous contrast. RADIATION DOSE REDUCTION: This exam was performed according to the departmental dose-optimization program which includes automated exposure control, adjustment of the mA and/or kV according to patient size and/or use of iterative reconstruction technique. CONTRAST:  OMNIPAQUE IOHEXOL 300 MG/ML  SOLN COMPARISON:  None Available. FINDINGS: Lower chest: There is some linear opacity lung bases likely scar or atelectasis. No pleural effusion. Coronary artery calcifications are seen. Patulous lower esophagus. Hepatobiliary: No focal liver abnormality is seen. No gallstones, gallbladder wall thickening, or biliary dilatation. Small appearing left hepatic lobe. Pancreas: Unremarkable. No pancreatic ductal dilatation or surrounding inflammatory changes. Spleen: Normal in size without focal abnormality. Adrenals/Urinary Tract: Adrenal glands are preserved. No enhancing renal mass or collecting system dilatation. There is parapelvic left-sided renal cyst. The ureters have normal course and caliber extending down to the bladder. Preserved contours of the urinary bladder. Stomach/Bowel: Subtle wall thickening along the rectum. Just proximal this there is a twisting of the mesentery as best appreciated on the coronal data set with swirling of the distal sigmoid colon the sigmoid colon extends into the right upper quadrant, up to the level of the diaphragm and is distended with a diameter approaching 6.5 cm, mildly  dilated. This could be a sigmoid volvulus with only partial obstruction. Focal caliber change identified at the swirling, twisting location in the upper central pelvis as seen on axial series 2, image 61. The more proximal portion of the bowel to this with a large amount of stool but no dilatation. Appendix  is not well seen. The stomach and small bowel are nondilated. Vascular/Lymphatic: Aortic atherosclerosis. No enlarged abdominal or pelvic lymph nodes. Reproductive: Prostate is unremarkable. Other: No free air or free fluid. No portal venous gas or pneumatosis. Small fat containing inguinal hernias. Musculoskeletal: Moderate degenerative changes of the spine and pelvis. There is grade 2 anterolisthesis with pars defects at L4-5. There is fusion across the disc space. Critical Value/emergent results were called by telephone at the time of interpretation on 10/15/2022 at 9:21 am to provider [facility] , who verbally acknowledged these results. IMPRESSION: Twisting appearance to the mesentery in the upper central pelvis and along the distal sigmoid colon with a caliber change at the level of the twist and a mildly dilated appearance to the sigmoid colon extending up to the diaphragm. Based on the appearance this could represent early changes of sigmoid volvulus with a partial obstruction. No inflammatory changes, pneumatosis or more significant bowel dilatation proximal. Please correlate with clinical presentation. Of note proximal this there is a large amount of stool in the colon. There is also some slight wall thickening along the rectum with some stranding. Please correlate for proctitis. Electronically Signed   By: Karen Kays M.D.   On: 10/15/2022 12:25               LOS: 1 day   Precious Segall  [facility]   Pager on [email or web address]. If 7PM-7AM, please contact night-coverage at [email or web address]     10/16/2022, 2:24 PM

## 2022-10-16 NOTE — Progress Notes (Signed)
Transition of Care [facility]) - Inpatient Brief Assessment   Patient Details  Name: Raziel Camire MRN: [id number] Date of Birth: [birth year]  Transition of Care [facility] CM/SW Contact:    Truddie Hidden, RN Phone Number: 10/16/2022, 3:19 PM   Clinical Narrative: TOC assessing for ongoing needs and discharge planning.   Transition of Care Asessment: Insurance and Status: Insurance coverage has been reviewed Patient has primary care physician: Yes Home environment has been reviewed: Return to previous environment Prior level of function:: Independent Prior/Current Home Services: No current home services Social Determinants of Health Reivew: SDOH reviewed no interventions necessary Readmission risk has been reviewed: Yes Transition of care needs: no transition of care needs at this time

## 2022-10-16 NOTE — Consult Note (Signed)
ANTICOAGULATION CONSULT NOTE - Initial Consult  Pharmacy Consult for Heparin Indication:  History of Afib and multiple DVT/PE (2007, 2016, 2020) on Xarelto. need to hold due to need for surgery  Allergies  Allergen Reactions   Codeine Other (See Comments)    Hallucinations   Other Nausea Only    General anesthesia     Patient Measurements: Height: 5\' 9"  (175.3 cm) Weight: 102.4 kg (225 lb 12 oz) IBW/kg (Calculated) : 70.7 Heparin Dosing Weight: 91.8 kg  Vital Signs: Temp: 97.7 F (36.5 C) (07/04 0348) Temp Source: Oral (07/04 0028) BP: 170/84 (07/04 0348) Pulse Rate: 64 (07/04 0348)  Labs: Recent Labs    10/15/22 0931 10/15/22 1643 10/16/22 0559  HGB 14.2  --  14.1  HCT 43.4  --  41.4  PLT 175  --  148*  APTT  --  27 77*  LABPROT  --  17.0*  --   INR  --  1.4*  --   HEPARINUNFRC  --  >1.10* >1.10*  CREATININE 1.09  --   --      Estimated Creatinine Clearance: 71.2 mL/min (by C-G formula based on SCr of 1.09 mg/dL).   Medical History: Past Medical History:  Diagnosis Date   Activated protein C resistance 06/28/2019   Arthritis of knee    Arthropathy of lumbar facet joint    Atrial fibrillation    BPH (benign prostatic hyperplasia) 09/10/2018   DVT (deep venous thrombosis)    L leg   Dysplastic nevus 05/15/2021   Left Superior Pectoral, mod to severe, Excised 07/16/21   Erectile dysfunction 03/01/2015   Herniated lumbar disc without myelopathy 10/11/2020   Hyperchloremia    Hypercholesterolemia    Hypertension    Inability to walk 02/22/2021   Insomnia due to medical condition 09/30/2018   Major depressive disorder 01/22/2022   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   Nephrolithiasis    Osteoarthritis    Parkinson's disease    Plantar wart    Pneumonia    Post-operative nausea and vomiting 09/20/2020   Pulmonary embolism    Sciatica    Seasonal allergies    TIA (transient ischemic attack)     Medications:   Medications Prior to Admission  Medication Sig Dispense Refill Last Dose   acetaminophen (TYLENOL) 500 MG tablet Take 1,000 mg by mouth daily.   unknown at prn   amantadine (SYMMETREL) 100 MG capsule TAKE ONE CAPSULE BY MOUTH THREE TIMES A DAY (Patient taking differently: Take 100 mg by mouth 3 (three) times daily.) 270 capsule 0 10/15/2022 at 0600   amLODipine (NORVASC) 2.5 MG tablet TAKE 1 TABLET (2.5 MG TOTAL) BY MOUTH DAILY AS NEEDED (BLOOD PRESSURE GREATER THAN 160/90). TAKE IN THE MORNING (Patient taking differently: Take 2.5 mg by mouth at bedtime. TAKE 1 TABLET (2.5 MG TOTAL) BY MOUTH DAILY AS NEEDED (BLOOD PRESSURE GREATER THAN 160/90). TAKE IN THE MORNING) 90 tablet 1 10/14/2022 at 2200   calcium carbonate (TUMS - DOSED IN MG ELEMENTAL CALCIUM) 500 MG chewable tablet Chew 1 tablet by mouth daily as needed for indigestion or heartburn.   unknown at prn   carbidopa-levodopa (SINEMET CR) 50-200 MG tablet TAKE 1 TABLET BY MOUTH AT BEDTIME 90 tablet 0 10/14/2022 at 2200   carbidopa-levodopa (SINEMET IR) 25-100 MG tablet TAKE TWO TABLETS BY MOUTH FOUR TIMES A DAY AT 6AM, 10AM, 2PM. AND 6PM. (Patient taking differently: 2 tablets 4 (four) times daily. TAKE TWO TABLETS BY MOUTH FOUR TIMES A  DAY AT 6AM, 10AM, 2PM. AND 6PM.) 720 tablet 0 10/15/2022 at 0600   cetirizine (ZYRTEC) 10 MG tablet Take 10 mg by mouth at bedtime.   10/14/2022 at 2200   cholecalciferol (VITAMIN D3) 25 MCG (1000 UNIT) tablet Take 1,000 Units by mouth daily.   10/15/2022 at 0600   diclofenac Sodium (VOLTAREN) 1 % GEL APPLY TOPICALLY 4 GRAMS FOUR TIMES DAILY (Patient taking differently: Apply 4 g topically 4 (four) times daily as needed (pain).) 400 g 1 unknown at prn   hydrALAZINE (APRESOLINE) 25 MG tablet Take 1 tablet (25 mg total) by mouth 3 (three) times daily as needed (for SBP >160). Takes in the evening 90 tablet 3 unknown at prn   HYDROcodone-acetaminophen (NORCO/VICODIN) 5-325 MG tablet Take 1 tablet by mouth 2 (two) times daily.    10/14/2022   Levodopa (INBRIJA) 42 MG CAPS Samples of this drug were given to the patient, quantity 1 box of 32 capsules, Lot Number [id number] Exp: 03/2023  Patient has been educated on dosage and uses of medication. 32 capsule 0 unknown at prn   Levodopa (INBRIJA) 42 MG CAPS Samples of this drug were given to the patient, quantity 1, Lot Number [id number] Exp 12-24 60 capsule 0 unknown at prn   lisinopril (ZESTRIL) 30 MG tablet Take 1 tablet (30 mg total) by mouth daily. 90 tablet 1 10/14/2022 at 2200   melatonin 5 MG TABS Take 5 mg by mouth at bedtime.   10/14/2022 at 2200   methocarbamol (ROBAXIN) 500 MG tablet Take 1 tablet (500 mg total) by mouth every 6 (six) hours as needed for muscle spasms. 90 tablet 0 10/15/2022 at 0600   metoprolol succinate (TOPROL-XL) 25 MG 24 hr tablet Take 1 tablet (25 mg total) by mouth at bedtime. 90 tablet 1 10/14/2022 at 2200   Multiple Vitamin (MULTIVITAMIN) tablet Take 1 tablet by mouth daily at 6 PM.   10/14/2022 at 1800   oxybutynin (DITROPAN-XL) 10 MG 24 hr tablet Take 1 tablet (10 mg total) by mouth daily. 30 tablet 11 10/15/2022 at 0600   Polyethyl Glycol-Propyl Glycol (SYSTANE ULTRA) 0.4-0.3 % SOLN Place 1 drop into both eyes daily as needed (DRY EYES).   unknown at prn   polyethylene glycol powder (GLYCOLAX/MIRALAX) 17 GM/SCOOP powder Take 17 g by mouth 2 (two) times daily as needed. (Patient taking differently: Take 17 g by mouth at bedtime.) 3350 g 1 10/14/2022 at 1900   PREVIDENT 5000 BOOSTER PLUS 1.1 % PSTE Place 1 application onto teeth 2 (two) times daily.   10/15/2022 at 0600   rivaroxaban (XARELTO) 20 MG TABS tablet Take 1 tablet (20 mg total) by mouth daily. 90 tablet 1 10/14/2022 at 1600   rOPINIRole (REQUIP) 3 MG tablet TAKE ONE TABLET BY MOUTH THREE TIMES A DAY (Patient taking differently: Take 3 mg by mouth in the morning, at noon, and at bedtime.) 270 tablet 0 10/15/2022 at 0600   simvastatin (ZOCOR) 40 MG tablet Take 1 tablet (40 mg total) by mouth daily. 90  tablet 2 10/14/2022 at 2200   tamsulosin (FLOMAX) 0.4 MG CAPS capsule Take 2 capsules (0.8 mg total) by mouth daily. 180 capsule 1 10/14/2022 at 1400   traZODone (DESYREL) 50 MG tablet Take 1 tablet (50 mg total) by mouth at bedtime. 90 tablet 1 10/14/2022 at 2200   mirabegron ER (MYRBETRIQ) 25 MG TB24 tablet Take 1 tablet (25 mg total) by mouth daily. (Patient not taking: Reported on 10/15/2022) 30 tablet 0 Not Taking  Scheduled:   amantadine  100 mg Oral TID   amLODipine  2.5 mg Oral QHS   bisacodyl  20 mg Oral Once   carbidopa-levodopa  1 tablet Oral QHS   carbidopa-levodopa  2 tablet Oral QID   methocarbamol  1,000 mg Oral BID   metroNIDAZOLE  1,000 mg Oral Q8H   neomycin  1,000 mg Oral Q8H   oxybutynin  10 mg Oral Daily   polyethylene glycol powder  1 Container Oral Once   rOPINIRole  3 mg Oral TID   simvastatin  40 mg Oral q1800   tamsulosin  0.8 mg Oral Daily   traZODone  50 mg Oral QHS   Infusions:   [START ON 10/17/2022] cefoTEtan (CEFOTAN) IV     heparin 1,250 Units/hr (10/16/22 0501)   lactated ringers 75 mL/hr at 10/16/22 0501   PRN: acetaminophen, hydrALAZINE, ondansetron (ZOFRAN) IV Anti-infectives (From admission, onward)    Start     Dose/Rate Route Frequency Ordered Stop   10/17/22 0600  cefoTEtan (CEFOTAN) 2 g in sodium chloride 0.9 % 100 mL IVPB        2 g 200 mL/hr over 30 Minutes Intravenous On call to O.R. 10/15/22 1819 10/18/22 0559   10/16/22 0600  neomycin (MYCIFRADIN) tablet 1,000 mg        1,000 mg Oral Every 8 hours 10/15/22 1819 10/17/22 0559   10/16/22 0600  metroNIDAZOLE (FLAGYL) tablet 1,000 mg        1,000 mg Oral Every 8 hours 10/15/22 1819 10/17/22 0559       Assessment: 72 y.o. male with medical history significant of Parkinson's disease with mild neurocognitive disorder, hypertension, atrial fibrillation on Xarelto, hyperlipidemia, previous DVT, BPH, TIA, who presents to the ED due to abdominal pain. Last dose of rivaroxaban was 7/2. S/p  sigmoidoscopy. Possible colon resection sigmoid 7/5. Will use aPTT for heparin monitoring. CBC stable.   - NP called to state heparin gtt not started.  Sigmoidoscopy rescheduled for 7/8 Monday per NP.  Will reorder heparin 4000 units IV X 1 bolus (Not given as of yet) and resume heparin drip as originally ordered on 7/3 @ 2200.  Will retime aPTT and HL accordingly.    Goal of Therapy:  aPTT 66-102 seconds Monitor platelets by anticoagulation protocol: Yes   Plan:  7/4 @ 0559: aPTT = 77,  HL = >1.10 - aPTT therapeutic X 1, HL still elevated from Xarelto PTA - Will continue pt on current rate and recheck aPTT in 8 hrs on 7/4 @ 1400. - Will recheck HL on 7/5 with AM labs.   Sheldon Sem D, PharmD 10/16/2022,7:01 AM

## 2022-10-16 NOTE — Consult Note (Signed)
ANTICOAGULATION CONSULT NOTE - Initial Consult  Pharmacy Consult for Heparin Indication:  History of Afib and multiple DVT/PE (2007, 2016, 2020) on Xarelto. need to hold due to need for surgery  Allergies  Allergen Reactions   Codeine Other (See Comments)    Hallucinations   Other Nausea Only    General anesthesia     Patient Measurements: Height: 5\' 9"  (175.3 cm) Weight: 102.4 kg (225 lb 12 oz) IBW/kg (Calculated) : 70.7 Heparin Dosing Weight: 91.8 kg  Vital Signs: Temp: 97.5 F (36.4 C) (07/04 1314) Temp Source: Oral (07/04 1314) BP: 121/73 (07/04 1314) Pulse Rate: 59 (07/04 1314)  Labs: Recent Labs    10/15/22 0931 10/15/22 1643 10/16/22 0559 10/16/22 1421  HGB 14.2  --  14.1  --   HCT 43.4  --  41.4  --   PLT 175  --  148*  --   APTT  --  27 77* 74*  LABPROT  --  17.0*  --   --   INR  --  1.4*  --   --   HEPARINUNFRC  --  >1.10* >1.10*  --   CREATININE 1.09  --  0.85  --      Estimated Creatinine Clearance: 91.3 mL/min (by C-G formula based on SCr of 0.85 mg/dL).   Medical History: Past Medical History:  Diagnosis Date   Activated protein C resistance 06/28/2019   Arthritis of knee    Arthropathy of lumbar facet joint    Atrial fibrillation    BPH (benign prostatic hyperplasia) 09/10/2018   DVT (deep venous thrombosis)    L leg   Dysplastic nevus 05/15/2021   Left Superior Pectoral, mod to severe, Excised 07/16/21   Erectile dysfunction 03/01/2015   Herniated lumbar disc without myelopathy 10/11/2020   Hyperchloremia    Hypercholesterolemia    Hypertension    Inability to walk 02/22/2021   Insomnia due to medical condition 09/30/2018   Major depressive disorder 01/22/2022   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   Nephrolithiasis    Osteoarthritis    Parkinson's disease    Plantar wart    Pneumonia    Post-operative nausea and vomiting 09/20/2020   Pulmonary embolism    Sciatica    Seasonal allergies    TIA  (transient ischemic attack)     Medications:  Medications Prior to Admission  Medication Sig Dispense Refill Last Dose   acetaminophen (TYLENOL) 500 MG tablet Take 1,000 mg by mouth daily.   unknown at prn   amantadine (SYMMETREL) 100 MG capsule TAKE ONE CAPSULE BY MOUTH THREE TIMES A DAY (Patient taking differently: Take 100 mg by mouth 3 (three) times daily.) 270 capsule 0 10/15/2022 at 0600   amLODipine (NORVASC) 2.5 MG tablet TAKE 1 TABLET (2.5 MG TOTAL) BY MOUTH DAILY AS NEEDED (BLOOD PRESSURE GREATER THAN 160/90). TAKE IN THE MORNING (Patient taking differently: Take 2.5 mg by mouth at bedtime. TAKE 1 TABLET (2.5 MG TOTAL) BY MOUTH DAILY AS NEEDED (BLOOD PRESSURE GREATER THAN 160/90). TAKE IN THE MORNING) 90 tablet 1 10/14/2022 at 2200   calcium carbonate (TUMS - DOSED IN MG ELEMENTAL CALCIUM) 500 MG chewable tablet Chew 1 tablet by mouth daily as needed for indigestion or heartburn.   unknown at prn   carbidopa-levodopa (SINEMET CR) 50-200 MG tablet TAKE 1 TABLET BY MOUTH AT BEDTIME 90 tablet 0 10/14/2022 at 2200   carbidopa-levodopa (SINEMET IR) 25-100 MG tablet TAKE TWO TABLETS BY MOUTH FOUR TIMES A DAY AT  6AM, 10AM, 2PM. AND 6PM. (Patient taking differently: 2 tablets 4 (four) times daily. TAKE TWO TABLETS BY MOUTH FOUR TIMES A DAY AT 6AM, 10AM, 2PM. AND 6PM.) 720 tablet 0 10/15/2022 at 0600   cetirizine (ZYRTEC) 10 MG tablet Take 10 mg by mouth at bedtime.   10/14/2022 at 2200   cholecalciferol (VITAMIN D3) 25 MCG (1000 UNIT) tablet Take 1,000 Units by mouth daily.   10/15/2022 at 0600   diclofenac Sodium (VOLTAREN) 1 % GEL APPLY TOPICALLY 4 GRAMS FOUR TIMES DAILY (Patient taking differently: Apply 4 g topically 4 (four) times daily as needed (pain).) 400 g 1 unknown at prn   hydrALAZINE (APRESOLINE) 25 MG tablet Take 1 tablet (25 mg total) by mouth 3 (three) times daily as needed (for SBP >160). Takes in the evening 90 tablet 3 unknown at prn   HYDROcodone-acetaminophen (NORCO/VICODIN) 5-325 MG  tablet Take 1 tablet by mouth 2 (two) times daily.   10/14/2022   Levodopa (INBRIJA) 42 MG CAPS Samples of this drug were given to the patient, quantity 1 box of 32 capsules, Lot Number [id number] Exp: 03/2023  Patient has been educated on dosage and uses of medication. 32 capsule 0 unknown at prn   Levodopa (INBRIJA) 42 MG CAPS Samples of this drug were given to the patient, quantity 1, Lot Number [id number] Exp 12-24 60 capsule 0 unknown at prn   lisinopril (ZESTRIL) 30 MG tablet Take 1 tablet (30 mg total) by mouth daily. 90 tablet 1 10/14/2022 at 2200   melatonin 5 MG TABS Take 5 mg by mouth at bedtime.   10/14/2022 at 2200   methocarbamol (ROBAXIN) 500 MG tablet Take 1 tablet (500 mg total) by mouth every 6 (six) hours as needed for muscle spasms. 90 tablet 0 10/15/2022 at 0600   metoprolol succinate (TOPROL-XL) 25 MG 24 hr tablet Take 1 tablet (25 mg total) by mouth at bedtime. 90 tablet 1 10/14/2022 at 2200   Multiple Vitamin (MULTIVITAMIN) tablet Take 1 tablet by mouth daily at 6 PM.   10/14/2022 at 1800   oxybutynin (DITROPAN-XL) 10 MG 24 hr tablet Take 1 tablet (10 mg total) by mouth daily. 30 tablet 11 10/15/2022 at 0600   Polyethyl Glycol-Propyl Glycol (SYSTANE ULTRA) 0.4-0.3 % SOLN Place 1 drop into both eyes daily as needed (DRY EYES).   unknown at prn   polyethylene glycol powder (GLYCOLAX/MIRALAX) 17 GM/SCOOP powder Take 17 g by mouth 2 (two) times daily as needed. (Patient taking differently: Take 17 g by mouth at bedtime.) 3350 g 1 10/14/2022 at 1900   PREVIDENT 5000 BOOSTER PLUS 1.1 % PSTE Place 1 application onto teeth 2 (two) times daily.   10/15/2022 at 0600   rivaroxaban (XARELTO) 20 MG TABS tablet Take 1 tablet (20 mg total) by mouth daily. 90 tablet 1 10/14/2022 at 1600   rOPINIRole (REQUIP) 3 MG tablet TAKE ONE TABLET BY MOUTH THREE TIMES A DAY (Patient taking differently: Take 3 mg by mouth in the morning, at noon, and at bedtime.) 270 tablet 0 10/15/2022 at 0600   simvastatin (ZOCOR) 40 MG  tablet Take 1 tablet (40 mg total) by mouth daily. 90 tablet 2 10/14/2022 at 2200   tamsulosin (FLOMAX) 0.4 MG CAPS capsule Take 2 capsules (0.8 mg total) by mouth daily. 180 capsule 1 10/14/2022 at 1400   traZODone (DESYREL) 50 MG tablet Take 1 tablet (50 mg total) by mouth at bedtime. 90 tablet 1 10/14/2022 at 2200   mirabegron ER (MYRBETRIQ) 25 MG TB24  tablet Take 1 tablet (25 mg total) by mouth daily. (Patient not taking: Reported on 10/15/2022) 30 tablet 0 Not Taking   Scheduled:   amantadine  100 mg Oral TID   amLODipine  2.5 mg Oral QHS   carbidopa-levodopa  1 tablet Oral QHS   carbidopa-levodopa  2 tablet Oral QID   methocarbamol  1,000 mg Oral BID   metroNIDAZOLE  1,000 mg Oral Q8H   neomycin  1,000 mg Oral Q8H   oxybutynin  10 mg Oral Daily   rOPINIRole  3 mg Oral TID   simvastatin  40 mg Oral q1800   tamsulosin  0.8 mg Oral Daily   traZODone  50 mg Oral QHS   Infusions:   [START ON 10/17/2022] cefoTEtan (CEFOTAN) IV     heparin 1,250 Units/hr (10/16/22 1325)   lactated ringers 1,000 mL with potassium chloride 20 mEq infusion     PRN: acetaminophen, hydrALAZINE, ondansetron (ZOFRAN) IV Anti-infectives (From admission, onward)    Start     Dose/Rate Route Frequency Ordered Stop   10/17/22 0600  cefoTEtan (CEFOTAN) 2 g in sodium chloride 0.9 % 100 mL IVPB        2 g 200 mL/hr over 30 Minutes Intravenous On call to O.R. 10/15/22 1819 10/18/22 0559   10/16/22 0600  neomycin (MYCIFRADIN) tablet 1,000 mg        1,000 mg Oral Every 8 hours 10/15/22 1819 10/17/22 0559   10/16/22 0600  metroNIDAZOLE (FLAGYL) tablet 1,000 mg        1,000 mg Oral Every 8 hours 10/15/22 1819 10/17/22 0559       Assessment: 72 y.o. male with medical history significant of Parkinson's disease with mild neurocognitive disorder, hypertension, atrial fibrillation on Xarelto, hyperlipidemia, previous DVT, BPH, TIA, who presents to the ED due to abdominal pain. Last dose of rivaroxaban was 7/2. S/p  sigmoidoscopy. Possible colon resection sigmoid 7/5. Will use aPTT for heparin monitoring. CBC stable.   - NP called to state heparin gtt not started.  Sigmoidoscopy rescheduled for 7/8 Monday per NP.  Will reorder heparin 4000 units IV X 1 bolus (Not given as of yet) and resume heparin drip as originally ordered on 7/3 @ 2200.  Will retime aPTT and HL accordingly.   7/4 0559 aPTT=77,  HL>1.10  Therapeutic x1 7/4 1421 aPTT=74   Therapeutic x2      Goal of Therapy:  aPTT 66-102 seconds Monitor platelets by anticoagulation protocol: Yes   Plan:   -7/4 1421 aPTT=74 Therapeutic x2 - Will continue pt on current rate of 1250 units/hr -Drip set to stop @ 0330 on 7/5 for procedure -f/u when resumed for further labs  Alyana Kreiter A, PharmD 10/16/2022,3:26 PM

## 2022-10-16 NOTE — Progress Notes (Addendum)
GI Inpatient Follow-up Note  Subjective:  Patient seen in follow-up for sigmoid volvulus s/p flexible sigmoidoscopy yesterday for detorsion and decompression and placement of rectal tube. No acute events overnight. He denies any abdominal pain. Rectal tube still in place. Repeat KUB this morning showed improvement. No new complaints.   Scheduled Inpatient Medications:   amantadine  100 mg Oral TID   amLODipine  2.5 mg Oral QHS   carbidopa-levodopa  1 tablet Oral QHS   carbidopa-levodopa  2 tablet Oral QID   methocarbamol  1,000 mg Oral BID   metroNIDAZOLE  1,000 mg Oral Q8H   neomycin  1,000 mg Oral Q8H   oxybutynin  10 mg Oral Daily   rOPINIRole  3 mg Oral TID   simvastatin  40 mg Oral q1800   tamsulosin  0.8 mg Oral Daily   traZODone  50 mg Oral QHS    Continuous Inpatient Infusions:    [START ON 10/17/2022] cefoTEtan (CEFOTAN) IV     heparin 1,250 Units/hr (10/16/22 0501)    PRN Inpatient Medications:  acetaminophen, hydrALAZINE, ondansetron (ZOFRAN) IV  Review of Systems: Constitutional: Weight is stable.  Eyes: No changes in vision. ENT: No oral lesions, sore throat.  GI: see HPI.  Heme/Lymph: No easy bruising.  CV: No chest pain.  GU: No hematuria.  Integumentary: No rashes.  Neuro: No headaches.  Psych: No depression/anxiety.  Endocrine: No heat/cold intolerance.  Allergic/Immunologic: No urticaria.  Resp: No cough, SOB.  Musculoskeletal: No joint swelling.    Physical Examination: BP 138/82 (BP Location: Left Arm)   Pulse 63   Temp 97.8 F (36.6 C) (Oral)   Resp 20   Ht 5\' 9"  (1.753 m)   Wt 102.4 kg   SpO2 96%   BMI 33.34 kg/m  Gen: NAD, alert and oriented x 4 HEENT: PEERLA, EOMI, Neck: supple, no JVD or thyromegaly Chest: CTA bilaterally, no wheezes, crackles, or other adventitious sounds CV: RRR, no m/g/c/r Abd: soft, NT, ND, +BS in all four quadrants; no HSM, guarding, ridigity, or rebound tenderness Ext: no edema, well perfused with 2+  pulses, Skin: no rash or lesions noted Lymph: no LAD  Data: Lab Results  Component Value Date   WBC 5.7 10/16/2022   HGB 14.1 10/16/2022   HCT 41.4 10/16/2022   MCV 89.6 10/16/2022   PLT 148 (L) 10/16/2022   Recent Labs  Lab 10/15/22 0931 10/16/22 0559  HGB 14.2 14.1   Lab Results  Component Value Date   NA 136 10/16/2022   K 3.3 (L) 10/16/2022   CL 106 10/16/2022   CO2 22 10/16/2022   BUN 13 10/16/2022   CREATININE 0.85 10/16/2022   Lab Results  Component Value Date   ALT 6 10/15/2022   AST 14 (L) 10/15/2022   ALKPHOS 79 10/15/2022   BILITOT 1.0 10/15/2022   Recent Labs  Lab 10/15/22 1643 10/16/22 0559  APTT 27 77*  INR 1.4*  --    KUB 10/16/2022: IMPRESSION: 1. Unchanged position of the rectal tube which terminates over the left upper quadrant of the abdomen in the expected location of the mid sigmoid colon. 2. Gaseous distension of the sigmoid colon has improved when compared with the original CT from 10/15/22. 3. Continued paucity of bowel gas within the rectum.   Assessment/Plan:  72 y/o Caucasian male with a PMH of Parkinson's disease, hx of DVT/PE on Xarelto, atrial fibrillation, chronic opioid use, and hx of TIA presented to the [facility] ED 7/3 for chief complaint  of 5-day history of lower abdominal pain found to have sigmoid volvulus s/p flexible sigmoidoscopy for decompression and placement of rectal tube.   Sigmoid volvulus s/p flex sig with decompression  Abdominal pain and diarrhea 2/2 #1  Hx of DVT/PE on Xarelto  Parkinson's disease  Atrial fibrillation  Generalized debility  Chronic opioid use  Hx of TIA  Recommendations:  - Clinically, patient doing very well s/p sigmoidoscopy with decompression. No signs of recurrence of volvulus.  - No further GI interventions needed at this time - Continue management of comorbidities per primary team - Plan for surgery tomorrow with Dr. Aleen Campi for sigmoidectomy  - Continue with gentle bowel  prep as ordered by surgery  - Try to avoid opioid therapy at this time. Avoid anticholinergics.  - NPO - GI will sign off at this time. If there are questions or concerns, please call Dr. Norma Fredrickson.   Please call with questions or concerns.  Jacob Moores, PA-C [facility] [phone]

## 2022-10-17 ENCOUNTER — Other Ambulatory Visit: Payer: Self-pay

## 2022-10-17 ENCOUNTER — Inpatient Hospital Stay: Payer: Medicare HMO | Admitting: Certified Registered"

## 2022-10-17 ENCOUNTER — Encounter: Payer: Self-pay | Admitting: Internal Medicine

## 2022-10-17 ENCOUNTER — Encounter
Admission: EM | Disposition: A | Discharge status: Home Health | Payer: Self-pay | Source: Home / Self Care | Attending: Internal Medicine

## 2022-10-17 DIAGNOSIS — K562 Volvulus: Secondary | ICD-10-CM | POA: Diagnosis not present

## 2022-10-17 HISTORY — PX: COLON RESECTION SIGMOID: SHX6737

## 2022-10-17 LAB — CBC
HCT: 41.8 % (ref 39.0–52.0)
Hemoglobin: 14.1 g/dL (ref 13.0–17.0)
MCH: 30.5 pg (ref 26.0–34.0)
MCHC: 33.7 g/dL (ref 30.0–36.0)
MCV: 90.3 fL (ref 80.0–100.0)
Platelets: 147 10*3/uL — ABNORMAL LOW (ref 150–400)
RBC: 4.63 MIL/uL (ref 4.22–5.81)
RDW: 11.8 % (ref 11.5–15.5)
WBC: 4.9 10*3/uL (ref 4.0–10.5)
nRBC: 0 % (ref 0.0–0.2)

## 2022-10-17 LAB — BASIC METABOLIC PANEL
Anion gap: 7 (ref 5–15)
BUN: 11 mg/dL (ref 8–23)
CO2: 22 mmol/L (ref 22–32)
Calcium: 8.8 mg/dL — ABNORMAL LOW (ref 8.9–10.3)
Chloride: 110 mmol/L (ref 98–111)
Creatinine, Ser: 0.83 mg/dL (ref 0.61–1.24)
GFR, Estimated: >60 mL/min (ref >60)
Glucose, Bld: 114 mg/dL — ABNORMAL HIGH (ref 70–99)
Potassium: 3.4 mmol/L — ABNORMAL LOW (ref 3.5–5.1)
Sodium: 139 mmol/L (ref 135–145)

## 2022-10-17 LAB — TYPE AND SCREEN
ABO/RH(D): AB POS
Antibody Screen: NEGATIVE

## 2022-10-17 SURGERY — COLECTOMY, SIGMOID, OPEN
Anesthesia: General | Site: Abdomen

## 2022-10-17 MED ORDER — OXYCODONE HCL 5 MG PO TABS
5.0000 mg | ORAL_TABLET | ORAL | Status: DC | PRN
  Administered 2022-10-17 (×3): 10 mg via ORAL
  Administered 2022-10-18: 5 mg via ORAL
  Administered 2022-10-18: 10 mg via ORAL
  Administered 2022-10-19 – 2022-10-20 (×4): 5 mg via ORAL
  Filled 2022-10-17 (×2): qty 2
  Filled 2022-10-17 (×4): qty 1
  Filled 2022-10-17: qty 2
  Filled 2022-10-17: qty 1
  Filled 2022-10-17: qty 2

## 2022-10-17 MED ORDER — METOPROLOL TARTRATE 5 MG/5ML IV SOLN
INTRAVENOUS | Status: DC | PRN
  Administered 2022-10-17: 1 mg via INTRAVENOUS
  Administered 2022-10-17: 2 mg via INTRAVENOUS

## 2022-10-17 MED ORDER — FENTANYL CITRATE (PF) 100 MCG/2ML IJ SOLN: 25.0000 ug | INTRAMUSCULAR | Status: DC | PRN

## 2022-10-17 MED ORDER — SODIUM CHLORIDE FLUSH 0.9 % IV SOLN
INTRAVENOUS | Status: AC
  Filled 2022-10-17: qty 30

## 2022-10-17 MED ORDER — BUPIVACAINE LIPOSOME 1.3 % IJ SUSP
INTRAMUSCULAR | Status: AC
  Filled 2022-10-17: qty 20

## 2022-10-17 MED ORDER — HEPARIN (PORCINE) 25000 UT/250ML-% IV SOLN
1400.0000 [IU]/h | INTRAVENOUS | Status: DC
  Administered 2022-10-17: 1250 [IU]/h via INTRAVENOUS
  Administered 2022-10-18 – 2022-10-19 (×2): 1400 [IU]/h via INTRAVENOUS
  Filled 2022-10-17 (×4): qty 250

## 2022-10-17 MED ORDER — OXYCODONE HCL 5 MG/5ML PO SOLN: 5.0000 mg | Freq: Once | ORAL | Status: DC | PRN

## 2022-10-17 MED ORDER — ONDANSETRON HCL 4 MG/2ML IJ SOLN: 4.0000 mg | Freq: Once | INTRAMUSCULAR | Status: DC | PRN

## 2022-10-17 MED ORDER — HYDROMORPHONE HCL 1 MG/ML IJ SOLN
0.5000 mg | INTRAMUSCULAR | Status: DC | PRN
  Administered 2022-10-17: 0.5 mg via INTRAVENOUS
  Filled 2022-10-17: qty 0.5

## 2022-10-17 MED ORDER — KETAMINE HCL 50 MG/5ML IJ SOSY
PREFILLED_SYRINGE | INTRAMUSCULAR | Status: AC
  Filled 2022-10-17: qty 5

## 2022-10-17 MED ORDER — LIDOCAINE HCL (CARDIAC) PF 100 MG/5ML IV SOSY
PREFILLED_SYRINGE | INTRAVENOUS | Status: DC | PRN
  Administered 2022-10-17: 100 mg via INTRAVENOUS

## 2022-10-17 MED ORDER — LACTATED RINGERS IV SOLN: INTRAVENOUS | Status: DC | PRN

## 2022-10-17 MED ORDER — POTASSIUM CHLORIDE 2 MEQ/ML IV SOLN
INTRAVENOUS | Status: DC
  Filled 2022-10-17 (×8): qty 1000

## 2022-10-17 MED ORDER — SODIUM CHLORIDE 0.9 % IV SOLN
2.0000 g | Freq: Three times a day (TID) | INTRAVENOUS | Status: AC
  Administered 2022-10-17 – 2022-10-18 (×3): 2 g via INTRAVENOUS
  Filled 2022-10-17 (×3): qty 2

## 2022-10-17 MED ORDER — CALCIUM CHLORIDE 10 % IV SOLN
INTRAVENOUS | Status: DC | PRN
  Administered 2022-10-17: 100 mg via INTRAVENOUS
  Administered 2022-10-17: 200 mg via INTRAVENOUS
  Administered 2022-10-17: 100 mg via INTRAVENOUS
  Administered 2022-10-17: 200 mg via INTRAVENOUS

## 2022-10-17 MED ORDER — KETAMINE HCL 10 MG/ML IJ SOLN
INTRAMUSCULAR | Status: DC | PRN
  Administered 2022-10-17: 30 mg via INTRAVENOUS
  Administered 2022-10-17: 20 mg via INTRAVENOUS

## 2022-10-17 MED ORDER — PROPOFOL 10 MG/ML IV BOLUS
INTRAVENOUS | Status: DC | PRN
  Administered 2022-10-17: 150 ug/kg/min via INTRAVENOUS
  Administered 2022-10-17: 70 mg via INTRAVENOUS

## 2022-10-17 MED ORDER — ROCURONIUM BROMIDE 100 MG/10ML IV SOLN
INTRAVENOUS | Status: DC | PRN
  Administered 2022-10-17: 60 mg via INTRAVENOUS
  Administered 2022-10-17 (×2): 20 mg via INTRAVENOUS

## 2022-10-17 MED ORDER — SODIUM CHLORIDE 0.9 % IV SOLN
INTRAVENOUS | Status: AC
  Filled 2022-10-17: qty 2

## 2022-10-17 MED ORDER — HEMOSTATIC AGENTS (NO CHARGE) OPTIME
TOPICAL | Status: DC | PRN
  Administered 2022-10-17: 1 via TOPICAL

## 2022-10-17 MED ORDER — 0.9 % SODIUM CHLORIDE (POUR BTL) OPTIME
TOPICAL | Status: DC | PRN
  Administered 2022-10-17: 1800 mL

## 2022-10-17 MED ORDER — OXYCODONE HCL 5 MG PO TABS: 5.0000 mg | ORAL_TABLET | Freq: Once | ORAL | Status: DC | PRN

## 2022-10-17 MED ORDER — SUGAMMADEX SODIUM 200 MG/2ML IV SOLN
INTRAVENOUS | Status: DC | PRN
  Administered 2022-10-17: 200 mg via INTRAVENOUS

## 2022-10-17 MED ORDER — FENTANYL CITRATE (PF) 100 MCG/2ML IJ SOLN
INTRAMUSCULAR | Status: DC | PRN
  Administered 2022-10-17 (×2): 25 ug via INTRAVENOUS
  Administered 2022-10-17: 50 ug via INTRAVENOUS

## 2022-10-17 MED ORDER — FENTANYL CITRATE (PF) 100 MCG/2ML IJ SOLN
INTRAMUSCULAR | Status: AC
  Filled 2022-10-17: qty 2

## 2022-10-17 MED ORDER — PHENYLEPHRINE HCL-NACL 20-0.9 MG/250ML-% IV SOLN
INTRAVENOUS | Status: AC
  Filled 2022-10-17: qty 250

## 2022-10-17 MED ORDER — PROPOFOL 10 MG/ML IV BOLUS
INTRAVENOUS | Status: AC
  Filled 2022-10-17: qty 40

## 2022-10-17 MED ORDER — ACETAMINOPHEN 10 MG/ML IV SOLN: 1000.0000 mg | Freq: Once | INTRAVENOUS | Status: DC | PRN

## 2022-10-17 MED ORDER — SODIUM CHLORIDE (PF) 0.9 % IJ SOLN
INTRAMUSCULAR | Status: DC | PRN
  Administered 2022-10-17: 80 mL

## 2022-10-17 MED ORDER — PROPOFOL 1000 MG/100ML IV EMUL
INTRAVENOUS | Status: AC
  Filled 2022-10-17: qty 100

## 2022-10-17 MED ORDER — PHENYLEPHRINE 80 MCG/ML (10ML) SYRINGE FOR IV PUSH (FOR BLOOD PRESSURE SUPPORT)
PREFILLED_SYRINGE | INTRAVENOUS | Status: DC | PRN
  Administered 2022-10-17: 160 ug via INTRAVENOUS

## 2022-10-17 MED ORDER — BUPIVACAINE-EPINEPHRINE (PF) 0.25% -1:200000 IJ SOLN
INTRAMUSCULAR | Status: AC
  Filled 2022-10-17: qty 30

## 2022-10-17 MED ORDER — DEXAMETHASONE SODIUM PHOSPHATE 10 MG/ML IJ SOLN
INTRAMUSCULAR | Status: DC | PRN
  Administered 2022-10-17: 10 mg via INTRAVENOUS

## 2022-10-17 SURGICAL SUPPLY — 44 items
APL PRP STRL LF DISP 70% ISPRP (MISCELLANEOUS) ×1
CATH ROBINSON RED A/P 18FR (CATHETERS) IMPLANT
CHLORAPREP W/TINT 26 (MISCELLANEOUS) ×1 IMPLANT
DRAPE LAPAROTOMY 100X77 ABD (DRAPES) ×1 IMPLANT
DRAPE UNDER BUTTOCK W/FLU (DRAPES) ×1 IMPLANT
ELECT CAUTERY BLADE TIP 2.5 (TIP) ×1
ELECT REM PT RETURN 9FT ADLT (ELECTROSURGICAL) ×1
ELECTRODE CAUTERY BLDE TIP 2.5 (TIP) ×1 IMPLANT
ELECTRODE REM PT RTRN 9FT ADLT (ELECTROSURGICAL) ×1 IMPLANT
GAUZE SPONGE 4X4 12PLY STRL (GAUZE/BANDAGES/DRESSINGS) ×1 IMPLANT
GLOVE SURG SYN 7.0 (GLOVE) ×2 IMPLANT
GLOVE SURG SYN 7.0 PF PI (GLOVE) ×2 IMPLANT
GLOVE SURG SYN 7.5 E (GLOVE) ×2 IMPLANT
GLOVE SURG SYN 7.5 PF PI (GLOVE) ×2 IMPLANT
GOWN STRL REUS W/ TWL LRG LVL3 (GOWN DISPOSABLE) ×4 IMPLANT
GOWN STRL REUS W/TWL LRG LVL3 (GOWN DISPOSABLE) ×4
KIT TURNOVER KIT A (KITS) ×1 IMPLANT
MANIFOLD NEPTUNE II (INSTRUMENTS) ×1 IMPLANT
NDL HYPO 22X1.5 SAFETY MO (MISCELLANEOUS) ×1 IMPLANT
NEEDLE HYPO 22X1.5 SAFETY MO (MISCELLANEOUS) ×1 IMPLANT
NS IRRIG 1000ML POUR BTL (IV SOLUTION) ×1 IMPLANT
NS IRRIG 500ML POUR BTL (IV SOLUTION) IMPLANT
PACK BASIN MAJOR ARMC (MISCELLANEOUS) ×1 IMPLANT
PACK COLON CLEAN CLOSURE (MISCELLANEOUS) ×1 IMPLANT
PENCIL SMOKE EVACUATOR (MISCELLANEOUS) IMPLANT
RELOAD GRN CONTOUR (ENDOMECHANICALS) ×1 IMPLANT
RELOAD STAPLE 40 GRN THCK (ENDOMECHANICALS) IMPLANT
SPONGE T-LAP 18X18 ~~LOC~~+RFID (SPONGE) ×4 IMPLANT
STAPLER CIRCULAR MANUAL XL 33 (STAPLE) IMPLANT
STAPLER CVD CUT GN 40 RELOAD (ENDOMECHANICALS) ×1 IMPLANT
STAPLER CVD CUT GRN 40 RELOAD (ENDOMECHANICALS) IMPLANT
STAPLER SKIN PROX 35W (STAPLE) ×1 IMPLANT
SUT PDS AB 1 CT1 27 (SUTURE) ×2 IMPLANT
SUT PROLENE 2 0 SH DA (SUTURE) IMPLANT
SUT VIC AB 3-0 SH 27 (SUTURE) ×2
SUT VIC AB 3-0 SH 27X BRD (SUTURE) IMPLANT
SYR 10ML LL (SYRINGE) ×1 IMPLANT
SYR 20ML LL LF (SYRINGE) IMPLANT
SYR BULB IRRIG 60ML STRL (SYRINGE) IMPLANT
TRAP FLUID SMOKE EVACUATOR (MISCELLANEOUS) ×1 IMPLANT
TRAY FOLEY MTR SLVR 16FR STAT (SET/KITS/TRAYS/PACK) ×1 IMPLANT
TUBING CONNECTING 10 (TUBING) IMPLANT
WATER STERILE IRR 1000ML POUR (IV SOLUTION) IMPLANT
YANKAUER SUCT BULB TIP NO VENT (SUCTIONS) IMPLANT

## 2022-10-17 NOTE — Progress Notes (Signed)
10/17/2022  Subjective: No acute events.  Patient has been having multiple bowel movements yesterday and overnight with the bowel prep.  Denies any abdominal pain or nausea.  Heparin drip stopped at 3:30 AM in preparation for surgery.  Vital signs: Temp:  [97.5 F (36.4 C)-98.6 F (37 C)] 97.8 F (36.6 C) (07/05 0359) Pulse Rate:  [57-63] 63 (07/05 0359) Resp:  [17-20] 17 (07/05 0359) BP: (121-169)/(73-90) 145/77 (07/05 0359) SpO2:  [96 %-98 %] 97 % (07/05 0359)   Intake/Output: 07/04 0701 - 07/05 0700 In: 160.8 [I.V.:160.8] Out: 1060 [Urine:1060] Last BM Date : 10/16/22  Physical Exam: Constitutional:  No acute distress Abdomen:  soft, non-distended, non-tender.  Labs:  Recent Labs    10/16/22 0559 10/17/22 0454  WBC 5.7 4.9  HGB 14.1 14.1  HCT 41.4 41.8  PLT 148* 147*   Recent Labs    10/16/22 0559 10/17/22 0454  NA 136 139  K 3.3* 3.4*  CL 106 110  CO2 22 22  GLUCOSE 100* 114*  BUN 13 11  CREATININE 0.85 0.83  CALCIUM 8.6* 8.8*   Recent Labs    10/15/22 1643  LABPROT 17.0*  INR 1.4*    Imaging: No results found.  Assessment/Plan: This is a 72 y.o. male with sigmoid volvulus.  --Patient has been doing well after flex sig and decompression.  Has tolerated bowel prep well and is ready for surgery this morning.  Discussed again plan for open sigmoidectomy with the possibility of needing an ostomy.  He's willing to proceed. --Taking him to OR this morning.  I spent 35 minutes dedicated to the care of this patient on the date of this encounter to include pre-visit review of records, face-to-face time with the patient discussing diagnosis and management, and any post-visit coordination of care.  Howie Ill, MD [facility]

## 2022-10-17 NOTE — Anesthesia Preprocedure Evaluation (Signed)
Anesthesia Evaluation  Patient identified by MRN, date of birth, ID band Patient awake    Reviewed: Allergy & Precautions, NPO status , Patient's Chart, lab work & pertinent test results  History of Anesthesia Complications (+) PONV and history of anesthetic complications  Airway Mallampati: IV  TM Distance: >3 FB Neck ROM: Limited    Dental no notable dental hx. (+) Teeth Intact   Pulmonary neg pulmonary ROS, neg sleep apnea, neg COPD, Patient abstained from smoking.Not current smoker   Pulmonary exam normal breath sounds clear to auscultation       Cardiovascular Exercise Tolerance: Poor METShypertension, Pt. on medications (-) CAD and (-) Past MI (-) dysrhythmias  Rhythm:Regular Rate:Normal - Systolic murmurs    Neuro/Psych  PSYCHIATRIC DISORDERS  Depression   Dementia ParkinsonTIA   GI/Hepatic ,neg GERD  ,,(+)     (-) substance abuse    Endo/Other  neg diabetes    Renal/GU negative Renal ROS     Musculoskeletal   Abdominal   Peds  Hematology   Anesthesia Other Findings Past Medical History: 06/28/2019: Activated protein C resistance No date: Arthritis of knee No date: Arthropathy of lumbar facet joint No date: Atrial fibrillation 09/10/2018: BPH (benign prostatic hyperplasia) No date: DVT (deep venous thrombosis)     Comment:  L leg 05/15/2021: Dysplastic nevus     Comment:  Left Superior Pectoral, mod to severe, Excised 07/16/21 03/01/2015: Erectile dysfunction 10/11/2020: Herniated lumbar disc without myelopathy No date: Hyperchloremia No date: Hypercholesterolemia No date: Hypertension 02/22/2021: Inability to walk 09/30/2018: Insomnia due to medical condition 01/22/2022: Major depressive disorder 11/29/2018: Mild neurocognitive disorder due to Parkinson's disease No date: Nephrolithiasis No date: Osteoarthritis No date: Parkinson's disease No date: Plantar wart No date:  Pneumonia 09/20/2020: Post-operative nausea and vomiting No date: Pulmonary embolism No date: Sciatica No date: Seasonal allergies No date: TIA (transient ischemic attack)  Reproductive/Obstetrics                             Anesthesia Physical Anesthesia Plan  ASA: 3  Anesthesia Plan: General   Post-op Pain Management: Ofirmev IV (intra-op)*   Induction: Intravenous  PONV Risk Score and Plan: 4 or greater and Ondansetron, Dexamethasone, Treatment may vary due to age or medical condition, TIVA and Propofol infusion  Airway Management Planned: Oral ETT  Additional Equipment: None  Intra-op Plan:   Post-operative Plan: Extubation in OR  Informed Consent: I have reviewed the patients History and Physical, chart, labs and discussed the procedure including the risks, benefits and alternatives for the proposed anesthesia with the patient or authorized representative who has indicated his/her understanding and acceptance.     Dental advisory given  Plan Discussed with: CRNA and Surgeon  Anesthesia Plan Comments: (Discussed risks of anesthesia with patient, including PONV, sore throat, lip/dental/eye damage. Rare risks discussed as well, such as cardiorespiratory and neurological sequelae, and allergic reactions. Discussed the role of CRNA in patient's perioperative care. Patient understands.  No nausea vomiting, patient has been having Bms, rectal tube is out. No concern for obstruction)       Anesthesia Quick Evaluation

## 2022-10-17 NOTE — Anesthesia Postprocedure Evaluation (Signed)
Anesthesia Post Note  Patient: Thomas [name]  Procedure(s) Performed: FLEXIBLE SIGMOIDOSCOPY BOWEL DECOMPRESSION  Patient location during evaluation: PACU Anesthesia Type: General Level of consciousness: awake and alert, oriented and patient cooperative Pain management: pain level controlled Vital Signs Assessment: post-procedure vital signs reviewed and stable Respiratory status: spontaneous breathing, nonlabored ventilation and respiratory function stable Cardiovascular status: blood pressure returned to baseline and stable Postop Assessment: adequate PO intake Anesthetic complications: no   No notable events documented.   Last Vitals:  Vitals:   10/17/22 1217 10/17/22 1605  BP: (!) 140/74 (!) 169/86  Pulse: 61 72  Resp: 16 19  Temp: 36.4 C 37.2 C  SpO2: 92% 95%    Last Pain:  Vitals:   10/17/22 1605  TempSrc: Oral  PainSc:                  [name]

## 2022-10-17 NOTE — Progress Notes (Signed)
Patient arrived back from OR.  He is alert x1 (self only at this time); very groggy.  Patient denies pain at this time and has a foley in place.  Per MD- will continue foley until tomorrow.   Patient is allowed to get ice chips and sips with medication today and diet will be advanced tomorrow.

## 2022-10-17 NOTE — Progress Notes (Signed)
Progress Note    Thomas [name]  ZOX:[id number] DOB: 1950/04/16  DOA: 10/15/2022 PCP: Dorcas Carrow, DO      Brief Narrative:    Medical records reviewed and are as summarized below:  Thomas [name] is a 72 y.o. male with medical history significant of Parkinson's disease with mild neurocognitive disorder, hypertension, atrial fibrillation on Xarelto, hyperlipidemia, previous DVT, BPH, TIA, who presented to the hospital with abdominal pain and diarrhea.  He previously had constipation and took an enema for this.  Subsequently, he developed watery diarrhea that lasted approximately 5 days.      Assessment/Plan:   Principal Problem:   Sigmoid volvulus (HCC) Active Problems:   DVT (deep venous thrombosis) (HCC)   Atrial fibrillation (HCC)   Hypertension   Parkinson's disease   Body mass index is 33.34 kg/m.  (Obesity)   Sigmoid volvulus: S/p decompressive sigmoidoscopy with rectal tube placement on 10/15/2022.  S/p open sigmoidectomy with colorectal anastomosis on 10/17/2022.  He is in NPO.  Continue IV fluids for hydration.  Analgesics as needed for pain.  Follow-up with general surgeon..   Atrial fibrillation, history of TIA: Xarelto has been held.  Per Dr. Aleen Campi, general surgeon, okay to resume IV heparin drip at 6 PM today.     History of recurrent DVT and PE in 2016 and 2020: Plan to resume IV heparin later this evening   Hypokalemia: Continue potassium repletion intravenously.   Parkinson's disease: Continue Sinemet, ropinirole and amantadine   Other comorbidities include hypertension, hypercholesterolemia, depression, BPH, arthritis, activated protein C resistance   Plan discussed with his wife at the bedside.  Diet Order             Diet NPO time specified Except for: Sips with Meds, Ice Chips  Diet effective now                            Consultants: Surveyor, minerals  Procedures: Decompressive sigmoidoscopy on  10/15/2022 Open sigmoidectomy with colorectal anastomosis on 10/17/2022    Medications:    amantadine  100 mg Oral TID   amLODipine  2.5 mg Oral QHS   carbidopa-levodopa  1 tablet Oral QHS   carbidopa-levodopa  2 tablet Oral QID   methocarbamol  1,000 mg Oral BID   oxybutynin  10 mg Oral Daily   rOPINIRole  3 mg Oral TID   simvastatin  40 mg Oral q1800   tamsulosin  0.8 mg Oral Daily   traZODone  50 mg Oral QHS   Continuous Infusions:  cefoTEtan (CEFOTAN) IV 2 g (10/17/22 1631)   heparin     lactated ringers 1,000 mL with potassium chloride 20 mEq infusion 75 mL/hr at 10/17/22 1456     Anti-infectives (From admission, onward)    Start     Dose/Rate Route Frequency Ordered Stop   10/17/22 1600  cefoTEtan (CEFOTAN) 2 g in sodium chloride 0.9 % 100 mL IVPB        2 g 200 mL/hr over 30 Minutes Intravenous Every 8 hours 10/17/22 1218 10/18/22 1559   10/17/22 0600  cefoTEtan (CEFOTAN) 2 g in sodium chloride 0.9 % 100 mL IVPB        2 g 200 mL/hr over 30 Minutes Intravenous On call to O.R. 10/15/22 1819 10/17/22 0818   10/16/22 0600  neomycin (MYCIFRADIN) tablet 1,000 mg        1,000 mg Oral Every 8 hours 10/15/22 1819 10/16/22  2153   10/16/22 0600  metroNIDAZOLE (FLAGYL) tablet 1,000 mg        1,000 mg Oral Every 8 hours 10/15/22 1819 10/16/22 2153              Family Communication/Anticipated D/C date and plan/Code Status   DVT prophylaxis:      Code Status: Full Code  Family Communication: None Disposition Plan: Plan to discharge home in 4 to 5 days   Status is: Inpatient Remains inpatient appropriate because: S/p sigmoidectomy       Subjective:   Interval events noted.  He had abdominal surgery this morning.  He complains of abdominal pain and neck pain.  Neck pain suspected to be from poor positioning in bed.  His wife was at the bedside.  Victorino Dike, RN and CNA were also at the bedside.  Objective:    Vitals:   10/17/22 1115 10/17/22 1125 10/17/22  1217 10/17/22 1605  BP: 134/69 137/72 (!) 140/74 (!) 169/86  Pulse: 60 60 61 72  Resp: 19 17 16 19   Temp: 97.9 F (36.6 C) (!) 97.3 F (36.3 C) 97.6 F (36.4 C) 99 F (37.2 C)  TempSrc:   Oral Oral  SpO2: 100% 98% 92% 95%  Weight:      Height:       No data found.   Intake/Output Summary (Last 24 hours) at 10/17/2022 1632 Last data filed at 10/17/2022 1048 Gross per 24 hour  Intake 918.33 ml  Output 660 ml  Net 258.33 ml   Filed Weights   10/15/22 1642 10/15/22 1842 10/17/22 0717  Weight: 99.8 kg 102.4 kg 102.4 kg    Exam:  GEN: NAD SKIN: Warm and dry EYES: No pallor or icterus ENT: MMM CV: RRR PULM: CTA B ABD: soft, ND, NT, no bowel sounds heard, + midline incisional wound with blood stains on the dressing. CNS: AAO x 3, non focal EXT: No edema or tenderness       Data Reviewed:   I have personally reviewed following labs and imaging studies:  Labs: Labs show the following:   Basic Metabolic Panel: Recent Labs  Lab 10/15/22 0931 10/16/22 0559 10/17/22 0454  NA 140 136 139  K 3.8 3.3* 3.4*  CL 105 106 110  CO2 25 22 22   GLUCOSE 111* 100* 114*  BUN 23 13 11   CREATININE 1.09 0.85 0.83  CALCIUM 9.0 8.6* 8.8*   GFR Estimated Creatinine Clearance: 93.5 mL/min (by C-G formula based on SCr of 0.83 mg/dL). Liver Function Tests: Recent Labs  Lab 10/15/22 0931  AST 14*  ALT 6  ALKPHOS 79  BILITOT 1.0  PROT 7.0  ALBUMIN 4.2   Recent Labs  Lab 10/15/22 0931  LIPASE 43   No results for input(s): "AMMONIA" in the last 168 hours. Coagulation profile Recent Labs  Lab 10/15/22 1643  INR 1.4*    CBC: Recent Labs  Lab 10/15/22 0931 10/16/22 0559 10/17/22 0454  WBC 7.2 5.7 4.9  NEUTROABS  --  4.2  --   HGB 14.2 14.1 14.1  HCT 43.4 41.4 41.8  MCV 92.5 89.6 90.3  PLT 175 148* 147*   Cardiac Enzymes: No results for input(s): "CKTOTAL", "CKMB", "CKMBINDEX", "TROPONINI" in the last 168 hours. BNP (last 3 results) No results for input(s):  "PROBNP" in the last 8760 hours. CBG: No results for input(s): "GLUCAP" in the last 168 hours. D-Dimer: No results for input(s): "DDIMER" in the last 72 hours. Hgb A1c: No results for input(s): "HGBA1C" in  the last 72 hours. Lipid Profile: No results for input(s): "CHOL", "HDL", "LDLCALC", "TRIG", "CHOLHDL", "LDLDIRECT" in the last 72 hours. Thyroid function studies: No results for input(s): "TSH", "T4TOTAL", "T3FREE", "THYROIDAB" in the last 72 hours.  Invalid input(s): "FREET3" Anemia work up: No results for input(s): "VITAMINB12", "FOLATE", "FERRITIN", "TIBC", "IRON", "RETICCTPCT" in the last 72 hours. Sepsis Labs: Recent Labs  Lab 10/15/22 0931 10/15/22 1238 10/15/22 1834 10/16/22 0559 10/17/22 0454  WBC 7.2  --   --  5.7 4.9  LATICACIDVEN  --  0.9 1.2  --   --     Microbiology No results found for this or any previous visit (from the past 240 hour(s)).  Procedures and diagnostic studies:  DG Abd 2 Views  Result Date: 10/16/2022 CLINICAL DATA:  Sigmoid volvulus.  Follow-up exam. EXAM: ABDOMEN - 2 VIEW COMPARISON:  10/15/2022 FINDINGS: Unchanged position of the rectal tube which terminates over the left upper quadrant of the abdomen in the expected location of the mid sigmoid colon. Gaseous distension of the sigmoid colon has improved when compared with the original CT from 10/15/22. This currently measures approximately 7.4 cm in diameter compared with 9.1 cm previously. Persistent moderate stool burden within the right colon. Paucity of bowel gas noted within the pelvis beyond the level of the previously noted transition point at the distal sigmoid colon. IMPRESSION: 1. Unchanged position of the rectal tube which terminates over the left upper quadrant of the abdomen in the expected location of the mid sigmoid colon. 2. Gaseous distension of the sigmoid colon has improved when compared with the original CT from 10/15/22. 3. Continued paucity of bowel gas within the rectum.  Electronically Signed   By: Signa Kell M.D.   On: 10/16/2022 07:01               LOS: 2 days   Nikalas Bramel  [facility]   Pager on [email or web address]. If 7PM-7AM, please contact night-coverage at [email or web address]     10/17/2022, 4:32 PM

## 2022-10-17 NOTE — Consult Note (Addendum)
ANTICOAGULATION CONSULT NOTE - Initial Consult  Pharmacy Consult for Heparin Indication:  History of Afib and multiple DVT/PE (2007, 2016, 2020) on Xarelto. need to hold due to need for surgery  Allergies  Allergen Reactions   Codeine Other (See Comments)    Hallucinations   Other Nausea Only    General anesthesia     Patient Measurements: Height: 5\' 9"  (175.3 cm) Weight: 102.4 kg (225 lb 12 oz) IBW/kg (Calculated) : 70.7 Heparin Dosing Weight: 91.8 kg  Vital Signs: Temp: 97.3 F (36.3 C) (07/05 1125) Temp Source: Temporal (07/05 0717) BP: 137/72 (07/05 1125) Pulse Rate: 60 (07/05 1125)  Labs: Recent Labs    10/15/22 0931 10/15/22 1643 10/16/22 0559 10/16/22 1421 10/17/22 0454  HGB 14.2  --  14.1  --  14.1  HCT 43.4  --  41.4  --  41.8  PLT 175  --  148*  --  147*  APTT  --  27 77* 74*  --   LABPROT  --  17.0*  --   --   --   INR  --  1.4*  --   --   --   HEPARINUNFRC  --  >1.10* >1.10*  --   --   CREATININE 1.09  --  0.85  --  0.83     Estimated Creatinine Clearance: 93.5 mL/min (by C-G formula based on SCr of 0.83 mg/dL).   Medical History: Past Medical History:  Diagnosis Date   Activated protein C resistance 06/28/2019   Arthritis of knee    Arthropathy of lumbar facet joint    Atrial fibrillation    BPH (benign prostatic hyperplasia) 09/10/2018   DVT (deep venous thrombosis)    L leg   Dysplastic nevus 05/15/2021   Left Superior Pectoral, mod to severe, Excised 07/16/21   Erectile dysfunction 03/01/2015   Herniated lumbar disc without myelopathy 10/11/2020   Hyperchloremia    Hypercholesterolemia    Hypertension    Inability to walk 02/22/2021   Insomnia due to medical condition 09/30/2018   Major depressive disorder 01/22/2022   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   Nephrolithiasis    Osteoarthritis    Parkinson's disease    Plantar wart    Pneumonia    Post-operative nausea and vomiting 09/20/2020    Pulmonary embolism    Sciatica    Seasonal allergies    TIA (transient ischemic attack)     Medications:  Medications Prior to Admission  Medication Sig Dispense Refill Last Dose   acetaminophen (TYLENOL) 500 MG tablet Take 1,000 mg by mouth daily.   unknown at prn   amantadine (SYMMETREL) 100 MG capsule TAKE ONE CAPSULE BY MOUTH THREE TIMES A DAY (Patient taking differently: Take 100 mg by mouth 3 (three) times daily.) 270 capsule 0 10/15/2022 at 0600   amLODipine (NORVASC) 2.5 MG tablet TAKE 1 TABLET (2.5 MG TOTAL) BY MOUTH DAILY AS NEEDED (BLOOD PRESSURE GREATER THAN 160/90). TAKE IN THE MORNING (Patient taking differently: Take 2.5 mg by mouth at bedtime. TAKE 1 TABLET (2.5 MG TOTAL) BY MOUTH DAILY AS NEEDED (BLOOD PRESSURE GREATER THAN 160/90). TAKE IN THE MORNING) 90 tablet 1 10/14/2022 at 2200   calcium carbonate (TUMS - DOSED IN MG ELEMENTAL CALCIUM) 500 MG chewable tablet Chew 1 tablet by mouth daily as needed for indigestion or heartburn.   unknown at prn   carbidopa-levodopa (SINEMET CR) 50-200 MG tablet TAKE 1 TABLET BY MOUTH AT BEDTIME 90 tablet 0 10/14/2022 at 2200  carbidopa-levodopa (SINEMET IR) 25-100 MG tablet TAKE TWO TABLETS BY MOUTH FOUR TIMES A DAY AT 6AM, 10AM, 2PM. AND 6PM. (Patient taking differently: 2 tablets 4 (four) times daily. TAKE TWO TABLETS BY MOUTH FOUR TIMES A DAY AT 6AM, 10AM, 2PM. AND 6PM.) 720 tablet 0 10/15/2022 at 0600   cetirizine (ZYRTEC) 10 MG tablet Take 10 mg by mouth at bedtime.   10/14/2022 at 2200   cholecalciferol (VITAMIN D3) 25 MCG (1000 UNIT) tablet Take 1,000 Units by mouth daily.   10/15/2022 at 0600   diclofenac Sodium (VOLTAREN) 1 % GEL APPLY TOPICALLY 4 GRAMS FOUR TIMES DAILY (Patient taking differently: Apply 4 g topically 4 (four) times daily as needed (pain).) 400 g 1 unknown at prn   hydrALAZINE (APRESOLINE) 25 MG tablet Take 1 tablet (25 mg total) by mouth 3 (three) times daily as needed (for SBP >160). Takes in the evening 90 tablet 3 unknown at  prn   HYDROcodone-acetaminophen (NORCO/VICODIN) 5-325 MG tablet Take 1 tablet by mouth 2 (two) times daily.   10/14/2022   Levodopa (INBRIJA) 42 MG CAPS Samples of this drug were given to the patient, quantity 1 box of 32 capsules, Lot Number [id number] Exp: 03/2023  Patient has been educated on dosage and uses of medication. 32 capsule 0 unknown at prn   Levodopa (INBRIJA) 42 MG CAPS Samples of this drug were given to the patient, quantity 1, Lot Number [id number] Exp 12-24 60 capsule 0 unknown at prn   lisinopril (ZESTRIL) 30 MG tablet Take 1 tablet (30 mg total) by mouth daily. 90 tablet 1 10/14/2022 at 2200   melatonin 5 MG TABS Take 5 mg by mouth at bedtime.   10/14/2022 at 2200   methocarbamol (ROBAXIN) 500 MG tablet Take 1 tablet (500 mg total) by mouth every 6 (six) hours as needed for muscle spasms. 90 tablet 0 10/15/2022 at 0600   metoprolol succinate (TOPROL-XL) 25 MG 24 hr tablet Take 1 tablet (25 mg total) by mouth at bedtime. 90 tablet 1 10/14/2022 at 2200   Multiple Vitamin (MULTIVITAMIN) tablet Take 1 tablet by mouth daily at 6 PM.   10/14/2022 at 1800   oxybutynin (DITROPAN-XL) 10 MG 24 hr tablet Take 1 tablet (10 mg total) by mouth daily. 30 tablet 11 10/15/2022 at 0600   Polyethyl Glycol-Propyl Glycol (SYSTANE ULTRA) 0.4-0.3 % SOLN Place 1 drop into both eyes daily as needed (DRY EYES).   unknown at prn   polyethylene glycol powder (GLYCOLAX/MIRALAX) 17 GM/SCOOP powder Take 17 g by mouth 2 (two) times daily as needed. (Patient taking differently: Take 17 g by mouth at bedtime.) 3350 g 1 10/14/2022 at 1900   PREVIDENT 5000 BOOSTER PLUS 1.1 % PSTE Place 1 application onto teeth 2 (two) times daily.   10/15/2022 at 0600   rivaroxaban (XARELTO) 20 MG TABS tablet Take 1 tablet (20 mg total) by mouth daily. 90 tablet 1 10/14/2022 at 1600   rOPINIRole (REQUIP) 3 MG tablet TAKE ONE TABLET BY MOUTH THREE TIMES A DAY (Patient taking differently: Take 3 mg by mouth in the morning, at noon, and at bedtime.) 270  tablet 0 10/15/2022 at 0600   simvastatin (ZOCOR) 40 MG tablet Take 1 tablet (40 mg total) by mouth daily. 90 tablet 2 10/14/2022 at 2200   tamsulosin (FLOMAX) 0.4 MG CAPS capsule Take 2 capsules (0.8 mg total) by mouth daily. 180 capsule 1 10/14/2022 at 1400   traZODone (DESYREL) 50 MG tablet Take 1 tablet (50 mg total) by mouth  at bedtime. 90 tablet 1 10/14/2022 at 2200   mirabegron ER (MYRBETRIQ) 25 MG TB24 tablet Take 1 tablet (25 mg total) by mouth daily. (Patient not taking: Reported on 10/15/2022) 30 tablet 0 Not Taking   Scheduled:   amantadine  100 mg Oral TID   amLODipine  2.5 mg Oral QHS   carbidopa-levodopa  1 tablet Oral QHS   carbidopa-levodopa  2 tablet Oral QID   methocarbamol  1,000 mg Oral BID   oxybutynin  10 mg Oral Daily   rOPINIRole  3 mg Oral TID   simvastatin  40 mg Oral q1800   tamsulosin  0.8 mg Oral Daily   traZODone  50 mg Oral QHS   Infusions:   heparin     lactated ringers 1,000 mL with potassium chloride 20 mEq infusion 75 mL/hr at 10/17/22 0332   PRN: acetaminophen, hydrALAZINE, HYDROmorphone (DILAUDID) injection, ondansetron (ZOFRAN) IV, oxyCODONE Anti-infectives (From admission, onward)    Start     Dose/Rate Route Frequency Ordered Stop   10/17/22 0600  cefoTEtan (CEFOTAN) 2 g in sodium chloride 0.9 % 100 mL IVPB        2 g 200 mL/hr over 30 Minutes Intravenous On call to O.R. 10/15/22 1819 10/17/22 0818   10/16/22 0600  neomycin (MYCIFRADIN) tablet 1,000 mg        1,000 mg Oral Every 8 hours 10/15/22 1819 10/16/22 2153   10/16/22 0600  metroNIDAZOLE (FLAGYL) tablet 1,000 mg        1,000 mg Oral Every 8 hours 10/15/22 1819 10/16/22 2153       Assessment: 72 y.o. male with medical history significant of Parkinson's disease with mild neurocognitive disorder, hypertension, atrial fibrillation on Xarelto, hyperlipidemia, previous DVT, BPH, TIA, who presents to the ED due to abdominal pain. Last dose of rivaroxaban was 7/2. S/p sigmoidoscopy. Possible colon  resection sigmoid 7/5. Will use aPTT for heparin monitoring. CBC stable.   - NP called to state heparin gtt not started.  Sigmoidoscopy rescheduled for 7/8 Monday per NP.  Will reorder heparin 4000 units IV X 1 bolus (Not given as of yet) and resume heparin drip as originally ordered on 7/3 @ 2200.  Will retime aPTT and HL accordingly.   Goal of Therapy:  aPTT 66-102 seconds Monitor platelets by anticoagulation protocol: Yes   Results: 7/4 0559 aPTT=77,  HL>1.10  Therapeutic x1 7/4 1421 aPTT=74   Therapeutic x2  7/5 0330 - heparin infusion HELD for surgery - 7/5 1215 - resume heparin at 6pm per MD orders-  Plan:  - Will resume heparin infusion at 1250 units/hr (previously therapeutic rate). Start at 1800 per MD orders. No bolus due to recent surgery. - Next HL 8hrs after heparin infusion resumed  Niomi Valent Rodriguez-Guzman PharmD, BCPS 10/17/2022 12:13 PM

## 2022-10-17 NOTE — Anesthesia Postprocedure Evaluation (Signed)
Anesthesia Post Note  Patient: Tafari Chung  Procedure(s) Performed: COLON RESECTION SIGMOID (Abdomen)  Patient location during evaluation: PACU Anesthesia Type: General Level of consciousness: awake and alert Pain management: pain level controlled Vital Signs Assessment: post-procedure vital signs reviewed and stable Respiratory status: spontaneous breathing, nonlabored ventilation, respiratory function stable and patient connected to nasal cannula oxygen Cardiovascular status: blood pressure returned to baseline and stable Postop Assessment: no apparent nausea or vomiting Anesthetic complications: no   There were no known notable events for this encounter.   Last Vitals:  Vitals:   10/17/22 1125 10/17/22 1217  BP: 137/72 (!) 140/74  Pulse: 60 61  Resp: 17 16  Temp: (!) 36.3 C 36.4 C  SpO2: 98% 92%    Last Pain:  Vitals:   10/17/22 1217  TempSrc: Oral  PainSc:                  Corinda Gubler

## 2022-10-17 NOTE — Op Note (Signed)
Procedure Date:  10/17/2022  Pre-operative Diagnosis:  Sigmoid volvulus  Post-operative Diagnosis: Sigmoid volvulus  Procedure:  Open sigmoidectomy with colorectal anastomosis  Surgeon:  Howie Ill, MD  Assistant:  Lynden Oxford, PA-C  Anesthesia:  General endotracheal  Estimated Blood Loss:  50 ml  Specimens:  Sigmoid colon  Complications:  None  Indications for Procedure:  This is a 72 y.o. male who presents with sigmoid volvulus, s/p flex sig with decompression.  He now presents for sigmoidectomy.  The risks of bleeding, abscess or infection, injury to surrounding structures, and need for further procedures were all discussed with the patient and was willing to proceed.  Description of Procedure: The patient was correctly identified in the preoperative area and brought into the operating room.  The patient was placed supine with VTE prophylaxis in place.  Appropriate time-outs were performed.  Anesthesia was induced and the patient was intubated.  Foley catheter was placed.  Appropriate antibiotics were infused.  The abdomen was prepped and draped in a sterile fashion.  A midline incision was made and electrocautery was used to dissect down the subcutaneous tissue to the fascia.  The fascia was incised and extended superiorly and inferiorly.  The sigmoid colon was very redundant and with evidence of mild volvulus, but without any bowel compromise and without any further distention.  The colon was placed back in anatomical orientation.  There were no other issues in his abdomen.  The point of proximal and distal transection were identified.  Distally, a window in the mesentery was created of the rectosigmoid junction and a contour 60 stapler was used to transect the bowel.  However, only part of the bowel was stapled and part of it was open, possibly a misfire, but there was some small amount of stool spillage.  The bowel was clamped proximal to this to prevent further spillage and  the area was cleaned.  We decided to move to the proximal portion of transection.  Another window was created in the mesentery of the distal descending colon and a blue load 75 mm stapler was used to transect across.  LigaSure was used to take down the mesentery of the sigmoid, which was well separated away from the left ureter and gonadal vessels.  This was taken distally beyond the point of misfire at the rectosigmoid junction.  A new contour load was used to transect beyond this point, now working well with a clean staple line in the rectum.  The staple line at the colon end was cut and sizers were used and the 33 mm sizer worked well.  The 33 mm stapler was opened and anvil placed inside the colon end.  A purse string suture was created with 2-0 prolene to secure the anvil in place.  Then, the epiploic fat and mesentery around the anvil site were cleaned.  Mr. Manus Rudd proceeded to the rectum and sizers again used and the 33 mm sizer fit well.  The stapler was inserted, spike deployed appropriately, and the anvil connected.  The stapler was fired and the anastomosis was created.  The pelvis was filled with saline and insufflation test through the rectum did not reveal any air leakage/bubbles.  The abdomen was irrigated thoroughly with 2 L of saline.  3 gm of arista powder was sprayed over the raw surfaces of the mesentery and pelvis.    We then proceeded with our clean closure.  Exparel solution mixed with 0.5% bupivacaine with epi and saline was infiltrated over the peritoneum,  fascia, and subcutaneous tissue.  The fascia was then closed using #1 PDS sutures.  The midline wound was irrigated and closed using 3-0 Vicryl and staples.  The wound was cleaned and dressed with Honeycomb dressing.  The patient was emerged from anesthesia and extubated and brought to the recovery room for further management.  The patient tolerated the procedure well and all counts were correct at the end of the case.   Howie Ill, MD

## 2022-10-17 NOTE — Anesthesia Procedure Notes (Signed)
Procedure Name: Intubation Date/Time: 10/17/2022 7:53 AM  Performed by: Maryla Morrow., CRNAPre-anesthesia Checklist: Patient identified, Patient being monitored, Timeout performed, Emergency Drugs available and Suction available Patient Re-evaluated:Patient Re-evaluated prior to induction Oxygen Delivery Method: Circle system utilized Preoxygenation: Pre-oxygenation with 100% oxygen Induction Type: IV induction Ventilation: Mask ventilation without difficulty Laryngoscope Size: McGraph and 4 Grade View: Grade I Tube type: Oral Tube size: 7.5 mm Number of attempts: 1 Airway Equipment and Method: Stylet Placement Confirmation: ETT inserted through vocal cords under direct vision, positive ETCO2 and breath sounds checked- equal and bilateral Secured at: 22 cm Tube secured with: Tape Dental Injury: Teeth and Oropharynx as per pre-operative assessment

## 2022-10-17 NOTE — Transfer of Care (Signed)
Immediate Anesthesia Transfer of Care Note  Patient: Thomas [name]  Procedure(s) Performed: COLON RESECTION SIGMOID (Abdomen)  Patient Location: PACU  Anesthesia Type:General  Level of Consciousness: drowsy  Airway & Oxygen Therapy: Patient Spontanous Breathing  Post-op Assessment: Report given to RN and Post -op Vital signs reviewed and stable  Post vital signs: stable  Last Vitals:  Vitals Value Taken Time  BP 126/61 10/17/22 1050  Temp    Pulse 65 10/17/22 1054  Resp 21 10/17/22 1054  SpO2 98 % 10/17/22 1054  Vitals shown include unvalidated device data.  Last Pain:  Vitals:   10/17/22 0717  TempSrc: Temporal  PainSc: 0-No pain         Complications: No notable events documented.

## 2022-10-18 ENCOUNTER — Other Ambulatory Visit: Payer: Self-pay | Admitting: Neurology

## 2022-10-18 ENCOUNTER — Encounter: Payer: Self-pay | Admitting: Surgery

## 2022-10-18 DIAGNOSIS — G20A1 Parkinson's disease without dyskinesia, without mention of fluctuations: Secondary | ICD-10-CM

## 2022-10-18 DIAGNOSIS — K562 Volvulus: Secondary | ICD-10-CM | POA: Diagnosis not present

## 2022-10-18 DIAGNOSIS — G20B1 Parkinson's disease with dyskinesia, without mention of fluctuations: Secondary | ICD-10-CM

## 2022-10-18 LAB — CBC
HCT: 40.3 % (ref 39.0–52.0)
Hemoglobin: 13.7 g/dL (ref 13.0–17.0)
MCH: 30.6 pg (ref 26.0–34.0)
MCHC: 34 g/dL (ref 30.0–36.0)
MCV: 90 fL (ref 80.0–100.0)
Platelets: 145 10*3/uL — ABNORMAL LOW (ref 150–400)
RBC: 4.48 MIL/uL (ref 4.22–5.81)
RDW: 11.8 % (ref 11.5–15.5)
WBC: 14.8 10*3/uL — ABNORMAL HIGH (ref 4.0–10.5)
nRBC: 0 % (ref 0.0–0.2)

## 2022-10-18 LAB — HEPARIN LEVEL (UNFRACTIONATED)
Heparin Unfractionated: 0.36 IU/mL (ref 0.30–0.70)
Heparin Unfractionated: 0.29 IU/mL — ABNORMAL LOW (ref 0.30–0.70)
Heparin Unfractionated: 0.46 IU/mL (ref 0.30–0.70)

## 2022-10-18 LAB — BASIC METABOLIC PANEL
Anion gap: 9 (ref 5–15)
BUN: 13 mg/dL (ref 8–23)
CO2: 20 mmol/L — ABNORMAL LOW (ref 22–32)
Calcium: 8.7 mg/dL — ABNORMAL LOW (ref 8.9–10.3)
Chloride: 108 mmol/L (ref 98–111)
Creatinine, Ser: 0.9 mg/dL (ref 0.61–1.24)
GFR, Estimated: >60 mL/min (ref >60)
Glucose, Bld: 137 mg/dL — ABNORMAL HIGH (ref 70–99)
Potassium: 3.6 mmol/L (ref 3.5–5.1)
Sodium: 137 mmol/L (ref 135–145)

## 2022-10-18 LAB — MAGNESIUM: Magnesium: 2 mg/dL (ref 1.7–2.4)

## 2022-10-18 MED ORDER — HEPARIN BOLUS VIA INFUSION
1300.0000 [IU] | Freq: Once | INTRAVENOUS | Status: AC
  Administered 2022-10-18: 1300 [IU] via INTRAVENOUS
  Filled 2022-10-18: qty 1300

## 2022-10-18 NOTE — Consult Note (Signed)
ANTICOAGULATION CONSULT NOTE   Pharmacy Consult for Heparin Indication:  History of Afib and multiple DVT/PE (2007, 2016, 2020) on Xarelto. need to hold due to need for surgery  Allergies  Allergen Reactions   Codeine Other (See Comments)    Hallucinations   Other Nausea Only    General anesthesia     Patient Measurements: Height: 5\' 9"  (175.3 cm) Weight: 102.4 kg (225 lb 12 oz) IBW/kg (Calculated) : 70.7 Heparin Dosing Weight: 91.8 kg  Vital Signs: Temp: 97.6 F (36.4 C) (07/06 0803) Temp Source: Oral (07/06 0803) BP: 146/81 (07/06 0803) Pulse Rate: 68 (07/06 0803)  Labs: Recent Labs    10/15/22 1643 10/15/22 1643 10/16/22 0559 10/16/22 1421 10/17/22 0454 10/18/22 0219 10/18/22 0946  HGB  --    < > 14.1  --  14.1 13.7  --   HCT  --   --  41.4  --  41.8 40.3  --   PLT  --   --  148*  --  147* 145*  --   APTT 27  --  77* 74*  --   --   --   LABPROT 17.0*  --   --   --   --   --   --   INR 1.4*  --   --   --   --   --   --   HEPARINUNFRC >1.10*  --  >1.10*  --   --  0.36 0.29*  CREATININE  --   --  0.85  --  0.83 0.90  --    < > = values in this interval not displayed.     Estimated Creatinine Clearance: 86.2 mL/min (by C-G formula based on SCr of 0.9 mg/dL).   Medical History: Past Medical History:  Diagnosis Date   Activated protein C resistance 06/28/2019   Arthritis of knee    Arthropathy of lumbar facet joint    Atrial fibrillation    BPH (benign prostatic hyperplasia) 09/10/2018   DVT (deep venous thrombosis)    L leg   Dysplastic nevus 05/15/2021   Left Superior Pectoral, mod to severe, Excised 07/16/21   Erectile dysfunction 03/01/2015   Herniated lumbar disc without myelopathy 10/11/2020   Hyperchloremia    Hypercholesterolemia    Hypertension    Inability to walk 02/22/2021   Insomnia due to medical condition 09/30/2018   Major depressive disorder 01/22/2022   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    Nephrolithiasis    Osteoarthritis    Parkinson's disease    Plantar wart    Pneumonia    Post-operative nausea and vomiting 09/20/2020   Pulmonary embolism    Sciatica    Seasonal allergies    TIA (transient ischemic attack)     Medications:  Medications Prior to Admission  Medication Sig Dispense Refill Last Dose   acetaminophen (TYLENOL) 500 MG tablet Take 1,000 mg by mouth daily.   unknown at prn   amantadine (SYMMETREL) 100 MG capsule TAKE ONE CAPSULE BY MOUTH THREE TIMES A DAY (Patient taking differently: Take 100 mg by mouth 3 (three) times daily.) 270 capsule 0 10/15/2022 at 0600   amLODipine (NORVASC) 2.5 MG tablet TAKE 1 TABLET (2.5 MG TOTAL) BY MOUTH DAILY AS NEEDED (BLOOD PRESSURE GREATER THAN 160/90). TAKE IN THE MORNING (Patient taking differently: Take 2.5 mg by mouth at bedtime. TAKE 1 TABLET (2.5 MG TOTAL) BY MOUTH DAILY AS NEEDED (BLOOD PRESSURE GREATER THAN 160/90). TAKE IN THE MORNING) 90  tablet 1 10/14/2022 at 2200   calcium carbonate (TUMS - DOSED IN MG ELEMENTAL CALCIUM) 500 MG chewable tablet Chew 1 tablet by mouth daily as needed for indigestion or heartburn.   unknown at prn   carbidopa-levodopa (SINEMET CR) 50-200 MG tablet TAKE 1 TABLET BY MOUTH AT BEDTIME 90 tablet 0 10/14/2022 at 2200   carbidopa-levodopa (SINEMET IR) 25-100 MG tablet TAKE TWO TABLETS BY MOUTH FOUR TIMES A DAY AT 6AM, 10AM, 2PM. AND 6PM. (Patient taking differently: 2 tablets 4 (four) times daily. TAKE TWO TABLETS BY MOUTH FOUR TIMES A DAY AT 6AM, 10AM, 2PM. AND 6PM.) 720 tablet 0 10/15/2022 at 0600   cetirizine (ZYRTEC) 10 MG tablet Take 10 mg by mouth at bedtime.   10/14/2022 at 2200   cholecalciferol (VITAMIN D3) 25 MCG (1000 UNIT) tablet Take 1,000 Units by mouth daily.   10/15/2022 at 0600   diclofenac Sodium (VOLTAREN) 1 % GEL APPLY TOPICALLY 4 GRAMS FOUR TIMES DAILY (Patient taking differently: Apply 4 g topically 4 (four) times daily as needed (pain).) 400 g 1 unknown at prn   hydrALAZINE (APRESOLINE)  25 MG tablet Take 1 tablet (25 mg total) by mouth 3 (three) times daily as needed (for SBP >160). Takes in the evening 90 tablet 3 unknown at prn   HYDROcodone-acetaminophen (NORCO/VICODIN) 5-325 MG tablet Take 1 tablet by mouth 2 (two) times daily.   10/14/2022   Levodopa (INBRIJA) 42 MG CAPS Samples of this drug were given to the patient, quantity 1 box of 32 capsules, Lot Number [id number] Exp: 03/2023  Patient has been educated on dosage and uses of medication. 32 capsule 0 unknown at prn   Levodopa (INBRIJA) 42 MG CAPS Samples of this drug were given to the patient, quantity 1, Lot Number [id number] Exp 12-24 60 capsule 0 unknown at prn   lisinopril (ZESTRIL) 30 MG tablet Take 1 tablet (30 mg total) by mouth daily. 90 tablet 1 10/14/2022 at 2200   melatonin 5 MG TABS Take 5 mg by mouth at bedtime.   10/14/2022 at 2200   methocarbamol (ROBAXIN) 500 MG tablet Take 1 tablet (500 mg total) by mouth every 6 (six) hours as needed for muscle spasms. 90 tablet 0 10/15/2022 at 0600   metoprolol succinate (TOPROL-XL) 25 MG 24 hr tablet Take 1 tablet (25 mg total) by mouth at bedtime. 90 tablet 1 10/14/2022 at 2200   Multiple Vitamin (MULTIVITAMIN) tablet Take 1 tablet by mouth daily at 6 PM.   10/14/2022 at 1800   oxybutynin (DITROPAN-XL) 10 MG 24 hr tablet Take 1 tablet (10 mg total) by mouth daily. 30 tablet 11 10/15/2022 at 0600   Polyethyl Glycol-Propyl Glycol (SYSTANE ULTRA) 0.4-0.3 % SOLN Place 1 drop into both eyes daily as needed (DRY EYES).   unknown at prn   polyethylene glycol powder (GLYCOLAX/MIRALAX) 17 GM/SCOOP powder Take 17 g by mouth 2 (two) times daily as needed. (Patient taking differently: Take 17 g by mouth at bedtime.) 3350 g 1 10/14/2022 at 1900   PREVIDENT 5000 BOOSTER PLUS 1.1 % PSTE Place 1 application onto teeth 2 (two) times daily.   10/15/2022 at 0600   rivaroxaban (XARELTO) 20 MG TABS tablet Take 1 tablet (20 mg total) by mouth daily. 90 tablet 1 10/14/2022 at 1600   rOPINIRole (REQUIP) 3 MG  tablet TAKE ONE TABLET BY MOUTH THREE TIMES A DAY (Patient taking differently: Take 3 mg by mouth in the morning, at noon, and at bedtime.) 270 tablet 0 10/15/2022 at 0600  simvastatin (ZOCOR) 40 MG tablet Take 1 tablet (40 mg total) by mouth daily. 90 tablet 2 10/14/2022 at 2200   tamsulosin (FLOMAX) 0.4 MG CAPS capsule Take 2 capsules (0.8 mg total) by mouth daily. 180 capsule 1 10/14/2022 at 1400   traZODone (DESYREL) 50 MG tablet Take 1 tablet (50 mg total) by mouth at bedtime. 90 tablet 1 10/14/2022 at 2200   mirabegron ER (MYRBETRIQ) 25 MG TB24 tablet Take 1 tablet (25 mg total) by mouth daily. (Patient not taking: Reported on 10/15/2022) 30 tablet 0 Not Taking   Scheduled:   amantadine  100 mg Oral TID   amLODipine  2.5 mg Oral QHS   carbidopa-levodopa  1 tablet Oral QHS   carbidopa-levodopa  2 tablet Oral QID   methocarbamol  1,000 mg Oral BID   oxybutynin  10 mg Oral Daily   rOPINIRole  3 mg Oral TID   simvastatin  40 mg Oral q1800   tamsulosin  0.8 mg Oral Daily   traZODone  50 mg Oral QHS   Infusions:   heparin 1,250 Units/hr (10/17/22 1844)   lactated ringers 1,000 mL with potassium chloride 20 mEq infusion 75 mL/hr at 10/18/22 0735   PRN: acetaminophen, hydrALAZINE, HYDROmorphone (DILAUDID) injection, ondansetron (ZOFRAN) IV, oxyCODONE Anti-infectives (From admission, onward)    Start     Dose/Rate Route Frequency Ordered Stop   10/17/22 1600  cefoTEtan (CEFOTAN) 2 g in sodium chloride 0.9 % 100 mL IVPB        2 g 200 mL/hr over 30 Minutes Intravenous Every 8 hours 10/17/22 1218 10/18/22 0940   10/17/22 0600  cefoTEtan (CEFOTAN) 2 g in sodium chloride 0.9 % 100 mL IVPB        2 g 200 mL/hr over 30 Minutes Intravenous On call to O.R. 10/15/22 1819 10/17/22 0818   10/16/22 0600  neomycin (MYCIFRADIN) tablet 1,000 mg        1,000 mg Oral Every 8 hours 10/15/22 1819 10/16/22 2153   10/16/22 0600  metroNIDAZOLE (FLAGYL) tablet 1,000 mg        1,000 mg Oral Every 8 hours 10/15/22  1819 10/16/22 2153       Assessment: 72 y.o. male with medical history significant of Parkinson's disease with mild neurocognitive disorder, hypertension, atrial fibrillation on Xarelto, hyperlipidemia, previous DVT, BPH, TIA, who presents to the ED due to abdominal pain. Last dose of rivaroxaban was 7/2. S/p sigmoidoscopy. S/p colon resection sigmoid 7/5   Goal of Therapy:  HL 0.3 - 0.7 Monitor platelets by anticoagulation protocol: Yes    7/6 0219   HL = 0.36,  therapeutic X 1  7/6 0946 HL = 0.29  Plan:  Heparin level is slightly subtherapeutic. Will give heparin bolus of 1300 x 1 and increase heparin to 1400 units/hr. Recheck heparin level in 8 hours. CBC daily while on heparin.    Ronnald Ramp, PharmD, BCPS 10/18/2022 10:26 AM

## 2022-10-18 NOTE — Consult Note (Signed)
ANTICOAGULATION CONSULT NOTE   Pharmacy Consult for Heparin Indication:  History of Afib and multiple DVT/PE (2007, 2016, 2020) on Xarelto. need to hold due to need for surgery  Allergies  Allergen Reactions   Codeine Other (See Comments)    Hallucinations   Other Nausea Only    General anesthesia     Patient Measurements: Height: 5\' 9"  (175.3 cm) Weight: 102.4 kg (225 lb 12 oz) IBW/kg (Calculated) : 70.7 Heparin Dosing Weight: 91.8 kg  Vital Signs: Temp: 97.6 F (36.4 C) (07/06 1621) Temp Source: Oral (07/06 1621) BP: 122/75 (07/06 1621) Pulse Rate: 67 (07/06 1621)  Labs: Recent Labs    10/16/22 0559 10/16/22 1421 10/17/22 0454 10/18/22 0219 10/18/22 0946 10/18/22 1828  HGB 14.1  --  14.1 13.7  --   --   HCT 41.4  --  41.8 40.3  --   --   PLT 148*  --  147* 145*  --   --   APTT 77* 74*  --   --   --   --   HEPARINUNFRC >1.10*  --   --  0.36 0.29* 0.46  CREATININE 0.85  --  0.83 0.90  --   --      Estimated Creatinine Clearance: 86.2 mL/min (by C-G formula based on SCr of 0.9 mg/dL).   Medical History: Past Medical History:  Diagnosis Date   Activated protein C resistance 06/28/2019   Arthritis of knee    Arthropathy of lumbar facet joint    Atrial fibrillation    BPH (benign prostatic hyperplasia) 09/10/2018   DVT (deep venous thrombosis)    L leg   Dysplastic nevus 05/15/2021   Left Superior Pectoral, mod to severe, Excised 07/16/21   Erectile dysfunction 03/01/2015   Herniated lumbar disc without myelopathy 10/11/2020   Hyperchloremia    Hypercholesterolemia    Hypertension    Inability to walk 02/22/2021   Insomnia due to medical condition 09/30/2018   Major depressive disorder 01/22/2022   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   Nephrolithiasis    Osteoarthritis    Parkinson's disease    Plantar wart    Pneumonia    Post-operative nausea and vomiting 09/20/2020   Pulmonary embolism    Sciatica    Seasonal  allergies    TIA (transient ischemic attack)     Medications:  Medications Prior to Admission  Medication Sig Dispense Refill Last Dose   acetaminophen (TYLENOL) 500 MG tablet Take 1,000 mg by mouth daily.   unknown at prn   amantadine (SYMMETREL) 100 MG capsule TAKE ONE CAPSULE BY MOUTH THREE TIMES A DAY (Patient taking differently: Take 100 mg by mouth 3 (three) times daily.) 270 capsule 0 10/15/2022 at 0600   amLODipine (NORVASC) 2.5 MG tablet TAKE 1 TABLET (2.5 MG TOTAL) BY MOUTH DAILY AS NEEDED (BLOOD PRESSURE GREATER THAN 160/90). TAKE IN THE MORNING (Patient taking differently: Take 2.5 mg by mouth at bedtime. TAKE 1 TABLET (2.5 MG TOTAL) BY MOUTH DAILY AS NEEDED (BLOOD PRESSURE GREATER THAN 160/90). TAKE IN THE MORNING) 90 tablet 1 10/14/2022 at 2200   calcium carbonate (TUMS - DOSED IN MG ELEMENTAL CALCIUM) 500 MG chewable tablet Chew 1 tablet by mouth daily as needed for indigestion or heartburn.   unknown at prn   carbidopa-levodopa (SINEMET CR) 50-200 MG tablet TAKE 1 TABLET BY MOUTH AT BEDTIME 90 tablet 0 10/14/2022 at 2200   carbidopa-levodopa (SINEMET IR) 25-100 MG tablet TAKE TWO TABLETS BY MOUTH FOUR  TIMES A DAY AT 6AM, 10AM, 2PM. AND 6PM. (Patient taking differently: 2 tablets 4 (four) times daily. TAKE TWO TABLETS BY MOUTH FOUR TIMES A DAY AT 6AM, 10AM, 2PM. AND 6PM.) 720 tablet 0 10/15/2022 at 0600   cetirizine (ZYRTEC) 10 MG tablet Take 10 mg by mouth at bedtime.   10/14/2022 at 2200   cholecalciferol (VITAMIN D3) 25 MCG (1000 UNIT) tablet Take 1,000 Units by mouth daily.   10/15/2022 at 0600   diclofenac Sodium (VOLTAREN) 1 % GEL APPLY TOPICALLY 4 GRAMS FOUR TIMES DAILY (Patient taking differently: Apply 4 g topically 4 (four) times daily as needed (pain).) 400 g 1 unknown at prn   hydrALAZINE (APRESOLINE) 25 MG tablet Take 1 tablet (25 mg total) by mouth 3 (three) times daily as needed (for SBP >160). Takes in the evening 90 tablet 3 unknown at prn   HYDROcodone-acetaminophen  (NORCO/VICODIN) 5-325 MG tablet Take 1 tablet by mouth 2 (two) times daily.   10/14/2022   Levodopa (INBRIJA) 42 MG CAPS Samples of this drug were given to the patient, quantity 1 box of 32 capsules, Lot Number [id number] Exp: 03/2023  Patient has been educated on dosage and uses of medication. 32 capsule 0 unknown at prn   Levodopa (INBRIJA) 42 MG CAPS Samples of this drug were given to the patient, quantity 1, Lot Number [id number] Exp 12-24 60 capsule 0 unknown at prn   lisinopril (ZESTRIL) 30 MG tablet Take 1 tablet (30 mg total) by mouth daily. 90 tablet 1 10/14/2022 at 2200   melatonin 5 MG TABS Take 5 mg by mouth at bedtime.   10/14/2022 at 2200   methocarbamol (ROBAXIN) 500 MG tablet Take 1 tablet (500 mg total) by mouth every 6 (six) hours as needed for muscle spasms. 90 tablet 0 10/15/2022 at 0600   metoprolol succinate (TOPROL-XL) 25 MG 24 hr tablet Take 1 tablet (25 mg total) by mouth at bedtime. 90 tablet 1 10/14/2022 at 2200   Multiple Vitamin (MULTIVITAMIN) tablet Take 1 tablet by mouth daily at 6 PM.   10/14/2022 at 1800   oxybutynin (DITROPAN-XL) 10 MG 24 hr tablet Take 1 tablet (10 mg total) by mouth daily. 30 tablet 11 10/15/2022 at 0600   Polyethyl Glycol-Propyl Glycol (SYSTANE ULTRA) 0.4-0.3 % SOLN Place 1 drop into both eyes daily as needed (DRY EYES).   unknown at prn   polyethylene glycol powder (GLYCOLAX/MIRALAX) 17 GM/SCOOP powder Take 17 g by mouth 2 (two) times daily as needed. (Patient taking differently: Take 17 g by mouth at bedtime.) 3350 g 1 10/14/2022 at 1900   PREVIDENT 5000 BOOSTER PLUS 1.1 % PSTE Place 1 application onto teeth 2 (two) times daily.   10/15/2022 at 0600   rivaroxaban (XARELTO) 20 MG TABS tablet Take 1 tablet (20 mg total) by mouth daily. 90 tablet 1 10/14/2022 at 1600   rOPINIRole (REQUIP) 3 MG tablet TAKE ONE TABLET BY MOUTH THREE TIMES A DAY (Patient taking differently: Take 3 mg by mouth in the morning, at noon, and at bedtime.) 270 tablet 0 10/15/2022 at 0600    simvastatin (ZOCOR) 40 MG tablet Take 1 tablet (40 mg total) by mouth daily. 90 tablet 2 10/14/2022 at 2200   tamsulosin (FLOMAX) 0.4 MG CAPS capsule Take 2 capsules (0.8 mg total) by mouth daily. 180 capsule 1 10/14/2022 at 1400   traZODone (DESYREL) 50 MG tablet Take 1 tablet (50 mg total) by mouth at bedtime. 90 tablet 1 10/14/2022 at 2200   mirabegron ER (  MYRBETRIQ) 25 MG TB24 tablet Take 1 tablet (25 mg total) by mouth daily. (Patient not taking: Reported on 10/15/2022) 30 tablet 0 Not Taking   Scheduled:   amantadine  100 mg Oral TID   amLODipine  2.5 mg Oral QHS   carbidopa-levodopa  1 tablet Oral QHS   carbidopa-levodopa  2 tablet Oral QID   methocarbamol  1,000 mg Oral BID   oxybutynin  10 mg Oral Daily   rOPINIRole  3 mg Oral TID   simvastatin  40 mg Oral q1800   tamsulosin  0.8 mg Oral Daily   traZODone  50 mg Oral QHS   Infusions:   heparin 1,400 Units/hr (10/18/22 1322)   lactated ringers 1,000 mL with potassium chloride 20 mEq infusion 75 mL/hr at 10/18/22 0735   PRN: acetaminophen, hydrALAZINE, HYDROmorphone (DILAUDID) injection, ondansetron (ZOFRAN) IV, oxyCODONE Anti-infectives (From admission, onward)    Start     Dose/Rate Route Frequency Ordered Stop   10/17/22 1600  cefoTEtan (CEFOTAN) 2 g in sodium chloride 0.9 % 100 mL IVPB        2 g 200 mL/hr over 30 Minutes Intravenous Every 8 hours 10/17/22 1218 10/18/22 0940   10/17/22 0600  cefoTEtan (CEFOTAN) 2 g in sodium chloride 0.9 % 100 mL IVPB        2 g 200 mL/hr over 30 Minutes Intravenous On call to O.R. 10/15/22 1819 10/17/22 0818   10/16/22 0600  neomycin (MYCIFRADIN) tablet 1,000 mg        1,000 mg Oral Every 8 hours 10/15/22 1819 10/16/22 2153   10/16/22 0600  metroNIDAZOLE (FLAGYL) tablet 1,000 mg        1,000 mg Oral Every 8 hours 10/15/22 1819 10/16/22 2153       Assessment: 72 y.o. male with medical history significant of Parkinson's disease with mild neurocognitive disorder, hypertension, atrial  fibrillation on Xarelto, hyperlipidemia, previous DVT, BPH, TIA, who presents to the ED due to abdominal pain. Last dose of rivaroxaban was 7/2. S/p sigmoidoscopy. S/p colon resection sigmoid 7/5   Goal of Therapy:  HL 0.3 - 0.7 Monitor platelets by anticoagulation protocol: Yes    7/6 0219   HL = 0.36,  therapeutic X 1  7/6 0946 HL = 0.29 7/6 1828 HL = 0.46  Plan:  Heparin level is therapeutic x 1. Continue heparin 1400 units/hr. Recheck heparin level in 8 hours. CBC daily while on heparin.    Elliot Gurney, PharmD, BCPS Clinical Pharmacist  10/18/2022 7:10 PM

## 2022-10-18 NOTE — Progress Notes (Signed)
Patient ID: Thomas [name], male   DOB: [birth year], 72 y.o.   MRN: [id number]     SURGICAL PROGRESS NOTE   Hospital Day(s): 3.   Interval History: Patient seen and examined, no acute events or new complaints overnight. Patient reports he still feels groggy from the anesthesia but otherwise he denies any worsening abdominal pain.  Currently pain controlled with current pain medication.  No nausea or vomiting.  Vital signs in last 24 hours: [min-max] current  Temp:  [97.3 F (36.3 C)-99.3 F (37.4 C)] 97.6 F (36.4 C) (07/06 0803) Pulse Rate:  [60-80] 68 (07/06 0803) Resp:  [9-19] 18 (07/06 0803) BP: (126-169)/(61-86) 146/81 (07/06 0803) SpO2:  [92 %-100 %] 95 % (07/06 0803)     Height: 5\' 9"  (175.3 cm) Weight: 102.4 kg BMI (Calculated): 33.32   Physical Exam:  Constitutional: alert, cooperative and no distress  Respiratory: breathing non-labored at rest  Cardiovascular: regular rate and sinus rhythm  Gastrointestinal: soft, non-tender, and non-distended.  The wound is dry and clean  Labs:     Latest Ref Rng & Units 10/18/2022    2:19 AM 10/17/2022    4:54 AM 10/16/2022    5:59 AM  CBC  WBC 4.0 - 10.5 K/uL 14.8  4.9  5.7   Hemoglobin 13.0 - 17.0 g/dL 40.9  81.1  91.4   Hematocrit 39.0 - 52.0 % 40.3  41.8  41.4   Platelets 150 - 400 K/uL 145  147  148       Latest Ref Rng & Units 10/18/2022    2:19 AM 10/17/2022    4:54 AM 10/16/2022    5:59 AM  CMP  Glucose 70 - 99 mg/dL 782  956  213   BUN 8 - 23 mg/dL 13  11  13    Creatinine 0.61 - 1.24 mg/dL 0.86  5.78  4.69   Sodium 135 - 145 mmol/L 137  139  136   Potassium 3.5 - 5.1 mmol/L 3.6  3.4  3.3   Chloride 98 - 111 mmol/L 108  110  106   CO2 22 - 32 mmol/L 20  22  22    Calcium 8.9 - 10.3 mg/dL 8.7  8.8  8.6     Imaging studies: No new pertinent imaging studies   Assessment/Plan:  72 y.o. male with sigmoid volvulus 1 Day Post-Op s/p sigmoid colectomy, complicated by pertinent comorbidities including Parkinson's disease, hypertension,  A-fib on Xarelto, hyperlipidemia.  -Doing well on postoperative day #1 -Pain is controlled -Adequate vital signs -Will start clear liquid diet today -Encouraged the patient to ambulate.  Consider PT/OT evaluation  Gae Gallop, MD

## 2022-10-18 NOTE — Progress Notes (Signed)
[facility]  - [place] [facility]   PATIENT NAME: Thomas [name]    MR#:  [id number]  DATE OF BIRTH:  03-Feb-1951  SUBJECTIVE:  patient seen earlier. No family at bedside. Underwent sigmoid volvulus surgery yesterday. Some abdominal pain from the surgery. No nausea vomiting.    VITALS:  Blood pressure (!) 148/79, pulse 65, temperature (!) 97.4 F (36.3 C), resp. rate 18, height 5\' 9"  (1.753 m), weight 102.4 kg, SpO2 98 %.  PHYSICAL EXAMINATION:   GENERAL:  72 y.o.-year-old patient with no acute distress.  LUNGS: Normal breath sounds bilaterally, no wheezing CARDIOVASCULAR: S1, S2 normal. No murmur   ABDOMEN: surgical dressing present.  EXTREMITIES: No  edema b/l.    NEUROLOGIC: nonfocal  patient is alert and awake SKIN: No obvious rash, lesion, or ulcer.   LABORATORY PANEL:  CBC Recent Labs  Lab 10/18/22 0219  WBC 14.8*  HGB 13.7  HCT 40.3  PLT 145*    Chemistries  Recent Labs  Lab 10/15/22 0931 10/16/22 0559 10/18/22 0219  NA 140   < > 137  K 3.8   < > 3.6  CL 105   < > 108  CO2 25   < > 20*  GLUCOSE 111*   < > 137*  BUN 23   < > 13  CREATININE 1.09   < > 0.90  CALCIUM 9.0   < > 8.7*  MG  --   --  2.0  AST 14*  --   --   ALT 6  --   --   ALKPHOS 79  --   --   BILITOT 1.0  --   --    < > = values in this interval not displayed.    Assessment and Plan  Thomas [name] is a 72 y.o. male with medical history significant of Parkinson's disease with mild neurocognitive disorder, hypertension, atrial fibrillation on Xarelto, hyperlipidemia, previous DVT, BPH, TIA, who presented to the hospital with abdominal pain and diarrhea.  He previously had constipation and took an enema for this.  Subsequently, he developed watery diarrhea that lasted approximately 5 days.   Sigmoid volvulus: S/p decompressive sigmoidoscopy with rectal tube placement on 10/15/2022.   --7/5--S/p open sigmoidectomy with colorectal anastomosis  -- 7/6--started CLD  Continue  IV fluids for hydration.  Analgesics as needed for pain.     Atrial fibrillation, history of TIA: Xarelto has been held.   --Per Dr. Aleen Campi, general surgeon, okay to resume IV heparin drip post op--will cont for now    History of recurrent DVT and PE in 2016 and 2020:  H/o Activated Protein C resistance --cont IV heparin   Hypokalemia: Continue potassium repletion     Parkinson's disease: Continue Sinemet, ropinirole and amantadine       Procedures:Open sigmoidectomy with colorectal anastomosis  Family communication :none today Consults : general surgery CODE STATUS: full DVT Prophylaxis : heparin Level of care: Progressive Status is: Inpatient Remains inpatient appropriate because: postop day one sigmoidectomy    TOTAL TIME TAKING CARE OF THIS PATIENT: 35 minutes.  >50% time spent on counselling and coordination of care  Note: This dictation was prepared with Dragon dictation along with smaller phrase technology. Any transcriptional errors that result from this process are unintentional.  Enedina Finner M.D    [facility]   CC: Primary care physician; Dorcas Carrow, DO

## 2022-10-18 NOTE — Consult Note (Signed)
ANTICOAGULATION CONSULT NOTE - Initial Consult  Pharmacy Consult for Heparin Indication:  History of Afib and multiple DVT/PE (2007, 2016, 2020) on Xarelto. need to hold due to need for surgery  Allergies  Allergen Reactions   Codeine Other (See Comments)    Hallucinations   Other Nausea Only    General anesthesia     Patient Measurements: Height: 5\' 9"  (175.3 cm) Weight: 102.4 kg (225 lb 12 oz) IBW/kg (Calculated) : 70.7 Heparin Dosing Weight: 91.8 kg  Vital Signs: Temp: 99.3 F (37.4 C) (07/05 2358) Temp Source: Oral (07/05 2358) BP: 151/83 (07/05 2358) Pulse Rate: 77 (07/05 2358)  Labs: Recent Labs    10/15/22 1643 10/16/22 0559 10/16/22 1421 10/17/22 0454 10/18/22 0219  HGB  --  14.1  --  14.1 13.7  HCT  --  41.4  --  41.8 40.3  PLT  --  148*  --  147* 145*  APTT 27 77* 74*  --   --   LABPROT 17.0*  --   --   --   --   INR 1.4*  --   --   --   --   HEPARINUNFRC >1.10* >1.10*  --   --  0.36  CREATININE  --  0.85  --  0.83 0.90     Estimated Creatinine Clearance: 86.2 mL/min (by C-G formula based on SCr of 0.9 mg/dL).   Medical History: Past Medical History:  Diagnosis Date   Activated protein C resistance 06/28/2019   Arthritis of knee    Arthropathy of lumbar facet joint    Atrial fibrillation    BPH (benign prostatic hyperplasia) 09/10/2018   DVT (deep venous thrombosis)    L leg   Dysplastic nevus 05/15/2021   Left Superior Pectoral, mod to severe, Excised 07/16/21   Erectile dysfunction 03/01/2015   Herniated lumbar disc without myelopathy 10/11/2020   Hyperchloremia    Hypercholesterolemia    Hypertension    Inability to walk 02/22/2021   Insomnia due to medical condition 09/30/2018   Major depressive disorder 01/22/2022   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   Nephrolithiasis    Osteoarthritis    Parkinson's disease    Plantar wart    Pneumonia    Post-operative nausea and vomiting 09/20/2020   Pulmonary  embolism    Sciatica    Seasonal allergies    TIA (transient ischemic attack)     Medications:  Medications Prior to Admission  Medication Sig Dispense Refill Last Dose   acetaminophen (TYLENOL) 500 MG tablet Take 1,000 mg by mouth daily.   unknown at prn   amantadine (SYMMETREL) 100 MG capsule TAKE ONE CAPSULE BY MOUTH THREE TIMES A DAY (Patient taking differently: Take 100 mg by mouth 3 (three) times daily.) 270 capsule 0 10/15/2022 at 0600   amLODipine (NORVASC) 2.5 MG tablet TAKE 1 TABLET (2.5 MG TOTAL) BY MOUTH DAILY AS NEEDED (BLOOD PRESSURE GREATER THAN 160/90). TAKE IN THE MORNING (Patient taking differently: Take 2.5 mg by mouth at bedtime. TAKE 1 TABLET (2.5 MG TOTAL) BY MOUTH DAILY AS NEEDED (BLOOD PRESSURE GREATER THAN 160/90). TAKE IN THE MORNING) 90 tablet 1 10/14/2022 at 2200   calcium carbonate (TUMS - DOSED IN MG ELEMENTAL CALCIUM) 500 MG chewable tablet Chew 1 tablet by mouth daily as needed for indigestion or heartburn.   unknown at prn   carbidopa-levodopa (SINEMET CR) 50-200 MG tablet TAKE 1 TABLET BY MOUTH AT BEDTIME 90 tablet 0 10/14/2022 at 2200  carbidopa-levodopa (SINEMET IR) 25-100 MG tablet TAKE TWO TABLETS BY MOUTH FOUR TIMES A DAY AT 6AM, 10AM, 2PM. AND 6PM. (Patient taking differently: 2 tablets 4 (four) times daily. TAKE TWO TABLETS BY MOUTH FOUR TIMES A DAY AT 6AM, 10AM, 2PM. AND 6PM.) 720 tablet 0 10/15/2022 at 0600   cetirizine (ZYRTEC) 10 MG tablet Take 10 mg by mouth at bedtime.   10/14/2022 at 2200   cholecalciferol (VITAMIN D3) 25 MCG (1000 UNIT) tablet Take 1,000 Units by mouth daily.   10/15/2022 at 0600   diclofenac Sodium (VOLTAREN) 1 % GEL APPLY TOPICALLY 4 GRAMS FOUR TIMES DAILY (Patient taking differently: Apply 4 g topically 4 (four) times daily as needed (pain).) 400 g 1 unknown at prn   hydrALAZINE (APRESOLINE) 25 MG tablet Take 1 tablet (25 mg total) by mouth 3 (three) times daily as needed (for SBP >160). Takes in the evening 90 tablet 3 unknown at prn    HYDROcodone-acetaminophen (NORCO/VICODIN) 5-325 MG tablet Take 1 tablet by mouth 2 (two) times daily.   10/14/2022   Levodopa (INBRIJA) 42 MG CAPS Samples of this drug were given to the patient, quantity 1 box of 32 capsules, Lot Number [id number] Exp: 03/2023  Patient has been educated on dosage and uses of medication. 32 capsule 0 unknown at prn   Levodopa (INBRIJA) 42 MG CAPS Samples of this drug were given to the patient, quantity 1, Lot Number [id number] Exp 12-24 60 capsule 0 unknown at prn   lisinopril (ZESTRIL) 30 MG tablet Take 1 tablet (30 mg total) by mouth daily. 90 tablet 1 10/14/2022 at 2200   melatonin 5 MG TABS Take 5 mg by mouth at bedtime.   10/14/2022 at 2200   methocarbamol (ROBAXIN) 500 MG tablet Take 1 tablet (500 mg total) by mouth every 6 (six) hours as needed for muscle spasms. 90 tablet 0 10/15/2022 at 0600   metoprolol succinate (TOPROL-XL) 25 MG 24 hr tablet Take 1 tablet (25 mg total) by mouth at bedtime. 90 tablet 1 10/14/2022 at 2200   Multiple Vitamin (MULTIVITAMIN) tablet Take 1 tablet by mouth daily at 6 PM.   10/14/2022 at 1800   oxybutynin (DITROPAN-XL) 10 MG 24 hr tablet Take 1 tablet (10 mg total) by mouth daily. 30 tablet 11 10/15/2022 at 0600   Polyethyl Glycol-Propyl Glycol (SYSTANE ULTRA) 0.4-0.3 % SOLN Place 1 drop into both eyes daily as needed (DRY EYES).   unknown at prn   polyethylene glycol powder (GLYCOLAX/MIRALAX) 17 GM/SCOOP powder Take 17 g by mouth 2 (two) times daily as needed. (Patient taking differently: Take 17 g by mouth at bedtime.) 3350 g 1 10/14/2022 at 1900   PREVIDENT 5000 BOOSTER PLUS 1.1 % PSTE Place 1 application onto teeth 2 (two) times daily.   10/15/2022 at 0600   rivaroxaban (XARELTO) 20 MG TABS tablet Take 1 tablet (20 mg total) by mouth daily. 90 tablet 1 10/14/2022 at 1600   rOPINIRole (REQUIP) 3 MG tablet TAKE ONE TABLET BY MOUTH THREE TIMES A DAY (Patient taking differently: Take 3 mg by mouth in the morning, at noon, and at bedtime.) 270 tablet 0  10/15/2022 at 0600   simvastatin (ZOCOR) 40 MG tablet Take 1 tablet (40 mg total) by mouth daily. 90 tablet 2 10/14/2022 at 2200   tamsulosin (FLOMAX) 0.4 MG CAPS capsule Take 2 capsules (0.8 mg total) by mouth daily. 180 capsule 1 10/14/2022 at 1400   traZODone (DESYREL) 50 MG tablet Take 1 tablet (50 mg total) by mouth  at bedtime. 90 tablet 1 10/14/2022 at 2200   mirabegron ER (MYRBETRIQ) 25 MG TB24 tablet Take 1 tablet (25 mg total) by mouth daily. (Patient not taking: Reported on 10/15/2022) 30 tablet 0 Not Taking   Scheduled:   amantadine  100 mg Oral TID   amLODipine  2.5 mg Oral QHS   carbidopa-levodopa  1 tablet Oral QHS   carbidopa-levodopa  2 tablet Oral QID   methocarbamol  1,000 mg Oral BID   oxybutynin  10 mg Oral Daily   rOPINIRole  3 mg Oral TID   simvastatin  40 mg Oral q1800   tamsulosin  0.8 mg Oral Daily   traZODone  50 mg Oral QHS   Infusions:   cefoTEtan (CEFOTAN) IV 2 g (10/18/22 0000)   heparin 1,250 Units/hr (10/17/22 1844)   lactated ringers 1,000 mL with potassium chloride 20 mEq infusion 75 mL/hr at 10/17/22 1755   PRN: acetaminophen, hydrALAZINE, HYDROmorphone (DILAUDID) injection, ondansetron (ZOFRAN) IV, oxyCODONE Anti-infectives (From admission, onward)    Start     Dose/Rate Route Frequency Ordered Stop   10/17/22 1600  cefoTEtan (CEFOTAN) 2 g in sodium chloride 0.9 % 100 mL IVPB        2 g 200 mL/hr over 30 Minutes Intravenous Every 8 hours 10/17/22 1218 10/18/22 1559   10/17/22 0600  cefoTEtan (CEFOTAN) 2 g in sodium chloride 0.9 % 100 mL IVPB        2 g 200 mL/hr over 30 Minutes Intravenous On call to O.R. 10/15/22 1819 10/17/22 0818   10/16/22 0600  neomycin (MYCIFRADIN) tablet 1,000 mg        1,000 mg Oral Every 8 hours 10/15/22 1819 10/16/22 2153   10/16/22 0600  metroNIDAZOLE (FLAGYL) tablet 1,000 mg        1,000 mg Oral Every 8 hours 10/15/22 1819 10/16/22 2153       Assessment: 72 y.o. male with medical history significant of Parkinson's  disease with mild neurocognitive disorder, hypertension, atrial fibrillation on Xarelto, hyperlipidemia, previous DVT, BPH, TIA, who presents to the ED due to abdominal pain. Last dose of rivaroxaban was 7/2. S/p sigmoidoscopy. Possible colon resection sigmoid 7/5. Will use aPTT for heparin monitoring. CBC stable.   - NP called to state heparin gtt not started.  Sigmoidoscopy rescheduled for 7/8 Monday per NP.  Will reorder heparin 4000 units IV X 1 bolus (Not given as of yet) and resume heparin drip as originally ordered on 7/3 @ 2200.  Will retime aPTT and HL accordingly.   Goal of Therapy:  HL 0.3 - 0.7 aPTT 66-102 seconds Monitor platelets by anticoagulation protocol: Yes   Results: 7/4 0559 aPTT=77,  HL>1.10  Therapeutic x1 7/4 1421 aPTT=74   Therapeutic x2  7/5 0330 - heparin infusion HELD for surgery - 7/5 1215 - resume heparin at 6pm per MD orders-  7/6 0219   HL = 0.36,  therapeutic X 1   Plan:  7/6:  HL @ 0219 = 0.36, therapeutic X 1 - Will continue pt on current rate and recheck HL in 8 hrs on 7/6 @ 1000.    Chaysen Tillman D 10/18/2022 3:04 AM

## 2022-10-19 DIAGNOSIS — K562 Volvulus: Secondary | ICD-10-CM | POA: Diagnosis not present

## 2022-10-19 LAB — CBC
HCT: 38.5 % — ABNORMAL LOW (ref 39.0–52.0)
Hemoglobin: 13 g/dL (ref 13.0–17.0)
MCH: 30.7 pg (ref 26.0–34.0)
MCHC: 33.8 g/dL (ref 30.0–36.0)
MCV: 90.8 fL (ref 80.0–100.0)
Platelets: 124 10*3/uL — ABNORMAL LOW (ref 150–400)
RBC: 4.24 MIL/uL (ref 4.22–5.81)
RDW: 12.2 % (ref 11.5–15.5)
WBC: 9.8 10*3/uL (ref 4.0–10.5)
nRBC: 0 % (ref 0.0–0.2)

## 2022-10-19 LAB — HEPARIN LEVEL (UNFRACTIONATED): Heparin Unfractionated: 0.41 IU/mL (ref 0.30–0.70)

## 2022-10-19 MED ORDER — LISINOPRIL 20 MG PO TABS
30.0000 mg | ORAL_TABLET | Freq: Every day | ORAL | Status: DC
  Administered 2022-10-19 – 2022-10-21 (×3): 30 mg via ORAL
  Filled 2022-10-19 (×3): qty 1

## 2022-10-19 MED ORDER — DICLOFENAC SODIUM 1 % EX GEL: 4.0000 g | Freq: Four times a day (QID) | CUTANEOUS | Status: DC | PRN

## 2022-10-19 MED ORDER — METOPROLOL SUCCINATE ER 25 MG PO TB24
25.0000 mg | ORAL_TABLET | Freq: Every day | ORAL | Status: DC
  Administered 2022-10-19 – 2022-10-20 (×2): 25 mg via ORAL
  Filled 2022-10-19 (×2): qty 1

## 2022-10-19 MED ORDER — VITAMIN D 25 MCG (1000 UNIT) PO TABS
1000.0000 [IU] | ORAL_TABLET | Freq: Every day | ORAL | Status: DC
  Administered 2022-10-19 – 2022-10-21 (×3): 1000 [IU] via ORAL
  Filled 2022-10-19 (×3): qty 1

## 2022-10-19 MED ORDER — METHOCARBAMOL 500 MG PO TABS
1000.0000 mg | ORAL_TABLET | Freq: Two times a day (BID) | ORAL | Status: DC
  Administered 2022-10-19 – 2022-10-21 (×5): 1000 mg via ORAL
  Filled 2022-10-19 (×5): qty 2

## 2022-10-19 MED ORDER — RIVAROXABAN 20 MG PO TABS
20.0000 mg | ORAL_TABLET | Freq: Every day | ORAL | Status: DC
  Administered 2022-10-19 – 2022-10-20 (×2): 20 mg via ORAL
  Filled 2022-10-19 (×3): qty 1

## 2022-10-19 NOTE — Progress Notes (Signed)
Patient ID: Garson Degnan, male   DOB: 08-24-1950, 72 y.o.   MRN: [id number]     SURGICAL PROGRESS NOTE   Hospital Day(s): 4.   Interval History: Patient seen and examined, no acute events or new complaints overnight. Patient reports feeling okay.  He endorses continued passing gas.  He had a small bowel movement this morning.  Denies any nausea or vomiting.  Tolerated a clear liquid diet.  Vital signs in last 24 hours: [min-max] current  Temp:  [97.4 F (36.3 C)-98.8 F (37.1 C)] 97.8 F (36.6 C) (07/07 0822) Pulse Rate:  [65-76] 71 (07/07 0822) Resp:  [18-20] 20 (07/07 0822) BP: (122-169)/(73-79) 151/79 (07/07 0822) SpO2:  [95 %-98 %] 96 % (07/07 0822)     Height: 5\' 9"  (175.3 cm) Weight: 102.4 kg BMI (Calculated): 33.32   Physical Exam:  Constitutional: alert, cooperative and no distress  Respiratory: breathing non-labored at rest  Cardiovascular: regular rate and sinus rhythm  Gastrointestinal: soft, non-tender, and non-distended  Labs:     Latest Ref Rng & Units 10/19/2022    3:32 AM 10/18/2022    2:19 AM 10/17/2022    4:54 AM  CBC  WBC 4.0 - 10.5 K/uL 9.8  14.8  4.9   Hemoglobin 13.0 - 17.0 g/dL 95.6  38.7  56.4   Hematocrit 39.0 - 52.0 % 38.5  40.3  41.8   Platelets 150 - 400 K/uL 124  145  147       Latest Ref Rng & Units 10/18/2022    2:19 AM 10/17/2022    4:54 AM 10/16/2022    5:59 AM  CMP  Glucose 70 - 99 mg/dL 332  951  884   BUN 8 - 23 mg/dL 13  11  13    Creatinine 0.61 - 1.24 mg/dL 1.66  0.63  0.16   Sodium 135 - 145 mmol/L 137  139  136   Potassium 3.5 - 5.1 mmol/L 3.6  3.4  3.3   Chloride 98 - 111 mmol/L 108  110  106   CO2 22 - 32 mmol/L 20  22  22    Calcium 8.9 - 10.3 mg/dL 8.7  8.8  8.6     Imaging studies: No new pertinent imaging studies   Assessment/Plan:  72 y.o. male with sigmoid volvulus 2 Day Post-Op s/p sigmoid colectomy, complicated by pertinent comorbidities including Parkinson's disease, hypertension, A-fib on Xarelto, hyperlipidemia.    -Continue adequate progress -No fever, no tachycardia -Patient had a small bowel movement.  He tolerated clear liquid diet.  Will advance to full liquid diet. -May restart oral anticoagulation therapy -No contraindication for PT/OT therapies  Gae Gallop, MD

## 2022-10-19 NOTE — Progress Notes (Signed)
[facility]  - [place] [facility]   PATIENT NAME: Thomas [name]    MR#:  [id number]  DATE OF BIRTH:  1950/12/31  SUBJECTIVE:   No family at bedside. POD #2 Some abdominal pain from the surgery. No nausea vomiting.Tolerating CLD. Cannot tell if passing flatus. Remains in NSR    VITALS:  Blood pressure (!) 151/79, pulse 71, temperature 97.8 F (36.6 C), resp. rate 20, height 5\' 9"  (1.753 m), weight 102.4 kg, SpO2 96 %.  PHYSICAL EXAMINATION:   GENERAL:  72 y.o.-year-old patient with no acute distress.  LUNGS: Normal breath sounds bilaterally, no wheezing CARDIOVASCULAR: S1, S2 normal. No murmur   ABDOMEN: surgical dressing present.  EXTREMITIES: No  edema b/l.    NEUROLOGIC: nonfocal  patient is alert and awake  LABORATORY PANEL:  CBC Recent Labs  Lab 10/19/22 0332  WBC 9.8  HGB 13.0  HCT 38.5*  PLT 124*     Chemistries  Recent Labs  Lab 10/15/22 0931 10/16/22 0559 10/18/22 0219  NA 140   < > 137  K 3.8   < > 3.6  CL 105   < > 108  CO2 25   < > 20*  GLUCOSE 111*   < > 137*  BUN 23   < > 13  CREATININE 1.09   < > 0.90  CALCIUM 9.0   < > 8.7*  MG  --   --  2.0  AST 14*  --   --   ALT 6  --   --   ALKPHOS 79  --   --   BILITOT 1.0  --   --    < > = values in this interval not displayed.     Assessment and Plan  Thomas [name] is a 72 y.o. male with medical history significant of Parkinson's disease with mild neurocognitive disorder, hypertension, atrial fibrillation on Xarelto, hyperlipidemia, previous DVT, BPH, TIA, who presented to the hospital with abdominal pain and diarrhea.  He previously had constipation and took an enema for this.  Subsequently, he developed watery diarrhea that lasted approximately 5 days.   Sigmoid volvulus: S/p decompressive sigmoidoscopy with rectal tube placement on 10/15/2022.   --7/5--S/p open sigmoidectomy with colorectal anastomosis  -- 7/6--started CLD  Continue IV fluids for hydration.  Analgesics as  needed for pain.  --7/7--overall doing ok. Some abd pain. Tolerating CLD. OK to resume Xarelto per Dr Hazle Quant.  Will start PT   Atrial fibrillation, history of TIA: -- Xarelto hasdbeen held.   --cont BB, remains in SR.  --switch to Xarelto     History of recurrent DVT and PE in 2016 and 2020:  H/o Activated Protein C resistance --now on Xarelto   Hypokalemia: Continue potassium repletion     Parkinson's disease: Continue Sinemet, ropinirole and amantadine  Overall progressing well. PT/OT to see pt      Procedures:Open sigmoidectomy with colorectal anastomosis  Family communication :none today Consults : general surgery CODE STATUS: full DVT Prophylaxis : heparin Level of care: Med-Surg Status is: Inpatient Remains inpatient appropriate because: postop day 2 sigmoidectomy    TOTAL TIME TAKING CARE OF THIS PATIENT: 35 minutes.  >50% time spent on counselling and coordination of care  Note: This dictation was prepared with Dragon dictation along with smaller phrase technology. Any transcriptional errors that result from this process are unintentional.  Enedina Finner M.D    [facility]   CC: Primary care physician; Dorcas Carrow, DO

## 2022-10-19 NOTE — Consult Note (Signed)
ANTICOAGULATION CONSULT NOTE   Pharmacy Consult for Heparin Indication:  History of Afib and multiple DVT/PE (2007, 2016, 2020) on Xarelto. need to hold due to need for surgery  Allergies  Allergen Reactions   Codeine Other (See Comments)    Hallucinations   Other Nausea Only    General anesthesia     Patient Measurements: Height: 5\' 9"  (175.3 cm) Weight: 102.4 kg (225 lb 12 oz) IBW/kg (Calculated) : 70.7 Heparin Dosing Weight: 91.8 kg  Vital Signs: Temp: 98.5 F (36.9 C) (07/06 2355) Temp Source: Oral (07/06 2355) BP: 169/77 (07/06 2355) Pulse Rate: 71 (07/06 2355)  Labs: Recent Labs    10/16/22 0559 10/16/22 1421 10/17/22 0454 10/18/22 0219 10/18/22 0946 10/18/22 1828 10/19/22 0332  HGB 14.1  --  14.1 13.7  --   --  13.0  HCT 41.4  --  41.8 40.3  --   --  38.5*  PLT 148*  --  147* 145*  --   --  124*  APTT 77* 74*  --   --   --   --   --   HEPARINUNFRC >1.10*  --   --  0.36 0.29* 0.46 0.41  CREATININE 0.85  --  0.83 0.90  --   --   --      Estimated Creatinine Clearance: 86.2 mL/min (by C-G formula based on SCr of 0.9 mg/dL).   Medical History: Past Medical History:  Diagnosis Date   Activated protein C resistance 06/28/2019   Arthritis of knee    Arthropathy of lumbar facet joint    Atrial fibrillation    BPH (benign prostatic hyperplasia) 09/10/2018   DVT (deep venous thrombosis)    L leg   Dysplastic nevus 05/15/2021   Left Superior Pectoral, mod to severe, Excised 07/16/21   Erectile dysfunction 03/01/2015   Herniated lumbar disc without myelopathy 10/11/2020   Hyperchloremia    Hypercholesterolemia    Hypertension    Inability to walk 02/22/2021   Insomnia due to medical condition 09/30/2018   Major depressive disorder 01/22/2022   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   Nephrolithiasis    Osteoarthritis    Parkinson's disease    Plantar wart    Pneumonia    Post-operative nausea and vomiting 09/20/2020    Pulmonary embolism    Sciatica    Seasonal allergies    TIA (transient ischemic attack)     Medications:  Medications Prior to Admission  Medication Sig Dispense Refill Last Dose   acetaminophen (TYLENOL) 500 MG tablet Take 1,000 mg by mouth daily.   unknown at prn   amantadine (SYMMETREL) 100 MG capsule TAKE ONE CAPSULE BY MOUTH THREE TIMES A DAY (Patient taking differently: Take 100 mg by mouth 3 (three) times daily.) 270 capsule 0 10/15/2022 at 0600   amLODipine (NORVASC) 2.5 MG tablet TAKE 1 TABLET (2.5 MG TOTAL) BY MOUTH DAILY AS NEEDED (BLOOD PRESSURE GREATER THAN 160/90). TAKE IN THE MORNING (Patient taking differently: Take 2.5 mg by mouth at bedtime. TAKE 1 TABLET (2.5 MG TOTAL) BY MOUTH DAILY AS NEEDED (BLOOD PRESSURE GREATER THAN 160/90). TAKE IN THE MORNING) 90 tablet 1 10/14/2022 at 2200   calcium carbonate (TUMS - DOSED IN MG ELEMENTAL CALCIUM) 500 MG chewable tablet Chew 1 tablet by mouth daily as needed for indigestion or heartburn.   unknown at prn   carbidopa-levodopa (SINEMET CR) 50-200 MG tablet TAKE 1 TABLET BY MOUTH AT BEDTIME 90 tablet 0 10/14/2022 at 2200  carbidopa-levodopa (SINEMET IR) 25-100 MG tablet TAKE TWO TABLETS BY MOUTH FOUR TIMES A DAY AT 6AM, 10AM, 2PM. AND 6PM. (Patient taking differently: 2 tablets 4 (four) times daily. TAKE TWO TABLETS BY MOUTH FOUR TIMES A DAY AT 6AM, 10AM, 2PM. AND 6PM.) 720 tablet 0 10/15/2022 at 0600   cetirizine (ZYRTEC) 10 MG tablet Take 10 mg by mouth at bedtime.   10/14/2022 at 2200   cholecalciferol (VITAMIN D3) 25 MCG (1000 UNIT) tablet Take 1,000 Units by mouth daily.   10/15/2022 at 0600   diclofenac Sodium (VOLTAREN) 1 % GEL APPLY TOPICALLY 4 GRAMS FOUR TIMES DAILY (Patient taking differently: Apply 4 g topically 4 (four) times daily as needed (pain).) 400 g 1 unknown at prn   hydrALAZINE (APRESOLINE) 25 MG tablet Take 1 tablet (25 mg total) by mouth 3 (three) times daily as needed (for SBP >160). Takes in the evening 90 tablet 3 unknown at  prn   HYDROcodone-acetaminophen (NORCO/VICODIN) 5-325 MG tablet Take 1 tablet by mouth 2 (two) times daily.   10/14/2022   Levodopa (INBRIJA) 42 MG CAPS Samples of this drug were given to the patient, quantity 1 box of 32 capsules, Lot Number [id number] Exp: 03/2023  Patient has been educated on dosage and uses of medication. 32 capsule 0 unknown at prn   Levodopa (INBRIJA) 42 MG CAPS Samples of this drug were given to the patient, quantity 1, Lot Number [id number] Exp 12-24 60 capsule 0 unknown at prn   lisinopril (ZESTRIL) 30 MG tablet Take 1 tablet (30 mg total) by mouth daily. 90 tablet 1 10/14/2022 at 2200   melatonin 5 MG TABS Take 5 mg by mouth at bedtime.   10/14/2022 at 2200   methocarbamol (ROBAXIN) 500 MG tablet Take 1 tablet (500 mg total) by mouth every 6 (six) hours as needed for muscle spasms. 90 tablet 0 10/15/2022 at 0600   metoprolol succinate (TOPROL-XL) 25 MG 24 hr tablet Take 1 tablet (25 mg total) by mouth at bedtime. 90 tablet 1 10/14/2022 at 2200   Multiple Vitamin (MULTIVITAMIN) tablet Take 1 tablet by mouth daily at 6 PM.   10/14/2022 at 1800   oxybutynin (DITROPAN-XL) 10 MG 24 hr tablet Take 1 tablet (10 mg total) by mouth daily. 30 tablet 11 10/15/2022 at 0600   Polyethyl Glycol-Propyl Glycol (SYSTANE ULTRA) 0.4-0.3 % SOLN Place 1 drop into both eyes daily as needed (DRY EYES).   unknown at prn   polyethylene glycol powder (GLYCOLAX/MIRALAX) 17 GM/SCOOP powder Take 17 g by mouth 2 (two) times daily as needed. (Patient taking differently: Take 17 g by mouth at bedtime.) 3350 g 1 10/14/2022 at 1900   PREVIDENT 5000 BOOSTER PLUS 1.1 % PSTE Place 1 application onto teeth 2 (two) times daily.   10/15/2022 at 0600   rivaroxaban (XARELTO) 20 MG TABS tablet Take 1 tablet (20 mg total) by mouth daily. 90 tablet 1 10/14/2022 at 1600   rOPINIRole (REQUIP) 3 MG tablet TAKE ONE TABLET BY MOUTH THREE TIMES A DAY (Patient taking differently: Take 3 mg by mouth in the morning, at noon, and at bedtime.) 270  tablet 0 10/15/2022 at 0600   simvastatin (ZOCOR) 40 MG tablet Take 1 tablet (40 mg total) by mouth daily. 90 tablet 2 10/14/2022 at 2200   tamsulosin (FLOMAX) 0.4 MG CAPS capsule Take 2 capsules (0.8 mg total) by mouth daily. 180 capsule 1 10/14/2022 at 1400   traZODone (DESYREL) 50 MG tablet Take 1 tablet (50 mg total) by mouth  at bedtime. 90 tablet 1 10/14/2022 at 2200   mirabegron ER (MYRBETRIQ) 25 MG TB24 tablet Take 1 tablet (25 mg total) by mouth daily. (Patient not taking: Reported on 10/15/2022) 30 tablet 0 Not Taking   Scheduled:   amantadine  100 mg Oral TID   amLODipine  2.5 mg Oral QHS   carbidopa-levodopa  1 tablet Oral QHS   carbidopa-levodopa  2 tablet Oral QID   methocarbamol  1,000 mg Oral BID   oxybutynin  10 mg Oral Daily   rOPINIRole  3 mg Oral TID   simvastatin  40 mg Oral q1800   tamsulosin  0.8 mg Oral Daily   traZODone  50 mg Oral QHS   Infusions:   heparin 1,400 Units/hr (10/18/22 1322)   lactated ringers 1,000 mL with potassium chloride 20 mEq infusion 75 mL/hr at 10/18/22 2303   PRN: acetaminophen, hydrALAZINE, HYDROmorphone (DILAUDID) injection, ondansetron (ZOFRAN) IV, oxyCODONE Anti-infectives (From admission, onward)    Start     Dose/Rate Route Frequency Ordered Stop   10/17/22 1600  cefoTEtan (CEFOTAN) 2 g in sodium chloride 0.9 % 100 mL IVPB        2 g 200 mL/hr over 30 Minutes Intravenous Every 8 hours 10/17/22 1218 10/18/22 0940   10/17/22 0600  cefoTEtan (CEFOTAN) 2 g in sodium chloride 0.9 % 100 mL IVPB        2 g 200 mL/hr over 30 Minutes Intravenous On call to O.R. 10/15/22 1819 10/17/22 0818   10/16/22 0600  neomycin (MYCIFRADIN) tablet 1,000 mg        1,000 mg Oral Every 8 hours 10/15/22 1819 10/16/22 2153   10/16/22 0600  metroNIDAZOLE (FLAGYL) tablet 1,000 mg        1,000 mg Oral Every 8 hours 10/15/22 1819 10/16/22 2153       Assessment: 72 y.o. male with medical history significant of Parkinson's disease with mild neurocognitive disorder,  hypertension, atrial fibrillation on Xarelto, hyperlipidemia, previous DVT, BPH, TIA, who presents to the ED due to abdominal pain. Last dose of rivaroxaban was 7/2. S/p sigmoidoscopy. S/p colon resection sigmoid 7/5   Goal of Therapy:  HL 0.3 - 0.7 Monitor platelets by anticoagulation protocol: Yes    7/6 0219   HL = 0.36,  therapeutic X 1  7/6 0946 HL = 0.29 7/6 1828 HL = 0.46 7/7 0332 HL = 0.41, therapeutic X 2   Plan:  Heparin level is therapeutic x 2.  Continue heparin 1400 units/hr.  Recheck HL on 7/8 with AM labs.  BC daily while on heparin.   Thomas [name] D Clinical Pharmacist  10/19/2022 4:08 AM

## 2022-10-19 NOTE — Evaluation (Signed)
Physical Therapy Evaluation Patient Details Name: Thomas [name] MRN: [id number] DOB: August 05, 1950 Today's Date: 10/19/2022  History of Present Illness  Thomas [name] is a 72 y.o. male with a history of hypertension, hypercholesterolemia, atrial fibrillation, DVT, TIA, BPH, and Parkinson's who presents with lower abdominal pain and diarrhea. s/p Open sigmoidectomy with colorectal anastomosis on 7/5.  Clinical Impression  Pt is a pleasant 72 year old male who was s/p open sigmoidectomy with colorectal anastamosis. Pt performs bed mobility mod A, transfers min A, and ambulation min guard with RW. Pt using hemiwalker at baseline, education patient on benefits of using RW w/ parkinson's diagnosis. Ambulated to bathroom w/ RW, min A for sit<>stand, and max A for hygiene. Continued ambulation for 156ft w/ RW, multiple freezing episodes. VC needed for energy conservation education during episodes. Pt fatigued quickly and returned to room. Mod A for sit<>supine in bed, assist needed for legs due to pain from abdominal incision. Pt demonstrates deficits with balance, mobility, pain, strength, ROM, safe DME use, and activity tolerance. Pt left in bed with call bell, alarm set and wife in room. Pt will continue to receive skilled PT services while admitted and will defer to TOC/care team for updates regarding disposition planning.         Assistance Recommended at Discharge Intermittent Supervision/Assistance  If plan is discharge home, recommend the following:  Can travel by private vehicle  A little help with walking and/or transfers;A little help with bathing/dressing/bathroom;Assistance with cooking/housework;Assist for transportation;Help with stairs or ramp for entrance        Equipment Recommendations Rolling walker (2 wheels)  Recommendations for Other Services  OT consult    Functional Status Assessment Patient has had a recent decline in their functional status and demonstrates the ability to make  significant improvements in function in a reasonable and predictable amount of time.     Precautions / Restrictions Precautions Precautions: Fall Restrictions Weight Bearing Restrictions: No      Mobility  Bed Mobility Overal bed mobility: Needs Assistance Bed Mobility: Sit to Supine       Sit to supine: Mod assist   General bed mobility comments: mod A for moving legs into bed. Wife said that his normal and may be due to fatigue after walking.    Transfers Overall transfer level: Needs assistance Equipment used: Rolling walker (2 wheels) Transfers: Sit to/from Stand Sit to Stand: Min assist           General transfer comment: min A from recliner and toilet with RW.    Ambulation/Gait Ambulation/Gait assistance: Min guard Gait Distance (Feet): 100 Feet Assistive device: Rolling walker (2 wheels) Gait Pattern/deviations: Shuffle, Step-through pattern, Step-to pattern, Decreased step length - left, Decreased step length - right, Narrow base of support, Trunk flexed       General Gait Details: Parkinsonian gait pattern, Freezing episodes during gait. Responded well to education on resting during episodes then reinitiating gait for energy conservation.  Stairs            Wheelchair Mobility     Tilt Bed    Modified Rankin (Stroke Patients Only)       Balance Overall balance assessment: Needs assistance Sitting-balance support: Bilateral upper extremity supported, Feet supported Sitting balance-Leahy Scale: Good     Standing balance support: Reliant on assistive device for balance, During functional activity, Bilateral upper extremity supported Standing balance-Leahy Scale: Fair  Pertinent Vitals/Pain Pain Assessment Pain Assessment: 0-10 Pain Score: 4  Pain Location: R shoulder Pain Descriptors / Indicators: Discomfort, Dull Pain Intervention(s): Limited activity within patient's tolerance, Monitored  during session, Repositioned    Home Living Family/patient expects to be discharged to:: Private residence Living Arrangements: Spouse/significant other Available Help at Discharge: Family;Available 24 hours/day Type of Home: House Home Access: Level entry     Alternate Level Stairs-Number of Steps: Flight Home Layout: Two level;Able to live on main level with bedroom/bathroom Home Equipment: Grab bars - toilet;Other (comment);Shower seat;Shower seat - built Charity fundraiser (2 wheels) (hemi-walker) Additional Comments: Pts wife unsure if they have a standard or rolling walker. Plans to notify PT next session after checking. If not RW one needs to be ordered for the patient.    Prior Function Prior Level of Function : Needs assist       Physical Assist : Mobility (physical);ADLs (physical) Mobility (physical): Bed mobility ADLs (physical): IADLs (wife does majority of the driving but he does some.) Mobility Comments: uses a hemiwalker at baseline that wife reports was causing a lot of freezing.       Hand Dominance        Extremity/Trunk Assessment   Upper Extremity Assessment Upper Extremity Assessment: Generalized weakness    Lower Extremity Assessment Lower Extremity Assessment: Overall WFL for tasks assessed       Communication   Communication: No difficulties  Cognition Arousal/Alertness: Awake/alert Behavior During Therapy: WFL for tasks assessed/performed Overall Cognitive Status: Within Functional Limits for tasks assessed                                          General Comments      Exercises Other Exercises Other Exercises: sit<>stand to toilet with min A. Max for hygiene.   Assessment/Plan    PT Assessment Patient needs continued PT services  PT Problem List Decreased strength;Decreased range of motion;Decreased activity tolerance;Decreased balance;Decreased mobility;Decreased coordination;Decreased knowledge of use of  DME;Pain       PT Treatment Interventions DME instruction;Stair training;Gait training;Functional mobility training;Therapeutic activities;Therapeutic exercise;Balance training;Neuromuscular re-education;Patient/family education    PT Goals (Current goals can be found in the Care Plan section)  Acute Rehab PT Goals Patient Stated Goal: To go home Time For Goal Achievement: 11/02/22 Potential to Achieve Goals: Good    Frequency Min 1X/week     Co-evaluation               AM-PAC PT "6 Clicks" Mobility  Outcome Measure Help needed turning from your back to your side while in a flat bed without using bedrails?: A Lot Help needed moving from lying on your back to sitting on the side of a flat bed without using bedrails?: A Lot Help needed moving to and from a bed to a chair (including a wheelchair)?: A Little Help needed standing up from a chair using your arms (e.g., wheelchair or bedside chair)?: A Little Help needed to walk in hospital room?: A Little Help needed climbing 3-5 steps with a railing? : A Little 6 Click Score: 16    End of Session Equipment Utilized During Treatment: Gait belt Activity Tolerance: Patient limited by fatigue Patient left: in bed;with call bell/phone within reach;with bed alarm set Nurse Communication: Mobility status PT Visit Diagnosis: Unsteadiness on feet (R26.81);Other abnormalities of gait and mobility (R26.89);Muscle weakness (generalized) (M62.81);Difficulty in walking, not elsewhere classified (  R26.2);Pain Pain - Right/Left: Right (stomach) Pain - part of body: Shoulder (incision)    Time: 7829-5621 PT Time Calculation (min) (ACUTE ONLY): 37 min   Charges:   PT Evaluation $PT Eval Moderate Complexity: 1 Mod PT Treatments $Gait Training: 8-22 mins PT General Charges $$ ACUTE PT VISIT: 1 Visit         Malachi Carl, SPT   Malachi Carl 10/19/2022, 3:49 PM

## 2022-10-20 ENCOUNTER — Ambulatory Visit: Payer: Medicare HMO | Admitting: Urology

## 2022-10-20 ENCOUNTER — Ambulatory Visit: Payer: Medicare HMO | Admitting: Neurology

## 2022-10-20 ENCOUNTER — Encounter: Payer: Self-pay | Admitting: Gastroenterology

## 2022-10-20 DIAGNOSIS — K562 Volvulus: Secondary | ICD-10-CM | POA: Diagnosis not present

## 2022-10-20 NOTE — Progress Notes (Signed)
Progress Note    Thomas [name]  ZOX:[id number] DOB: 1950/09/28  DOA: 10/15/2022 PCP: Dorcas Carrow, DO      Brief Narrative:    Medical records reviewed and are as summarized below:  Thomas [name] is a 72 y.o. male with medical history significant of Parkinson's disease with mild neurocognitive disorder, hypertension, atrial fibrillation on Xarelto, hyperlipidemia, previous DVT, BPH, TIA, who presented to the hospital with abdominal pain and diarrhea.  He previously had constipation and took an enema for this.  Subsequently, he developed watery diarrhea that lasted approximately 5 days.      Assessment/Plan:   Principal Problem:   Sigmoid volvulus (HCC) Active Problems:   DVT (deep venous thrombosis) (HCC)   Atrial fibrillation (HCC)   Hypertension   Parkinson's disease   Body mass index is 33.34 kg/m.  (Obesity)   Sigmoid volvulus: S/p decompressive sigmoidoscopy with rectal tube placement on 10/15/2022.  S/p open sigmoidectomy with colorectal anastomosis on 10/17/2022.  He is tolerating a soft diet.  Discontinue IV fluids.  Analgesics as needed for pain.    Atrial fibrillation, history of TIA: Continue Xarelto   History of recurrent DVT and PE in 2016 and 2020: Continue Xarelto   Hypokalemia: Improved   Parkinson's disease: Continue Sinemet, ropinirole and amantadine   Other comorbidities include hypertension, hypercholesterolemia, depression, BPH, arthritis, activated protein C resistance   Debility: His wife said he cannot care for him at home.  She requested discharge to SNF.  Consulted TOC to assist with disposition.     Diet Order             DIET SOFT Fluid consistency: Thin  Diet effective now                            Consultants: Gastroenterologist General surgeon  Procedures: Decompressive sigmoidoscopy on 10/15/2022 Open sigmoidectomy with colorectal anastomosis on 10/17/2022    Medications:    amantadine  100 mg  Oral TID   amLODipine  2.5 mg Oral QHS   carbidopa-levodopa  1 tablet Oral QHS   carbidopa-levodopa  2 tablet Oral QID   cholecalciferol  1,000 Units Oral Daily   lisinopril  30 mg Oral Daily   methocarbamol  1,000 mg Oral BID   metoprolol succinate  25 mg Oral QHS   oxybutynin  10 mg Oral Daily   rivaroxaban  20 mg Oral Q supper   rOPINIRole  3 mg Oral TID   simvastatin  40 mg Oral q1800   tamsulosin  0.8 mg Oral Daily   traZODone  50 mg Oral QHS   Continuous Infusions:     Anti-infectives (From admission, onward)    Start     Dose/Rate Route Frequency Ordered Stop   10/17/22 1600  cefoTEtan (CEFOTAN) 2 g in sodium chloride 0.9 % 100 mL IVPB        2 g 200 mL/hr over 30 Minutes Intravenous Every 8 hours 10/17/22 1218 10/18/22 0940   10/17/22 0600  cefoTEtan (CEFOTAN) 2 g in sodium chloride 0.9 % 100 mL IVPB        2 g 200 mL/hr over 30 Minutes Intravenous On call to O.R. 10/15/22 1819 10/17/22 0818   10/16/22 0600  neomycin (MYCIFRADIN) tablet 1,000 mg        1,000 mg Oral Every 8 hours 10/15/22 1819 10/16/22 2153   10/16/22 0600  metroNIDAZOLE (FLAGYL) tablet 1,000 mg  1,000 mg Oral Every 8 hours 10/15/22 1819 10/16/22 2153              Family Communication/Anticipated D/C date and plan/Code Status   DVT prophylaxis:  rivaroxaban (XARELTO) tablet 20 mg     Code Status: Full Code  Family Communication: None Disposition Plan: Plan to discharge home in 4 to 5 days   Status is: Inpatient Remains inpatient appropriate because: S/p sigmoidectomy       Subjective:   Interval events noted.  He complains of abdominal pain.  His wife is at the bedside and is concerned that she will not be able to care for him at home..  Objective:    Vitals:   10/20/22 0459 10/20/22 0645 10/20/22 0807 10/20/22 1030  BP: (!) 170/87 (!) 149/70 (!) 163/82 107/65  Pulse: 64  66   Resp: 18  18   Temp: (!) 97.3 F (36.3 C)  98.5 F (36.9 C)   TempSrc:      SpO2:  97%  96%   Weight:      Height:       No data found.   Intake/Output Summary (Last 24 hours) at 10/20/2022 1415 Last data filed at 10/20/2022 1052 Gross per 24 hour  Intake 3347.93 ml  Output 800 ml  Net 2547.93 ml   Filed Weights   10/15/22 1642 10/15/22 1842 10/17/22 0717  Weight: 99.8 kg 102.4 kg 102.4 kg    Exam:    GEN: NAD SKIN: No rash EYES: EOMI ENT: MMM CV: RRR PULM: CTA B ABD: soft, ND, NT, +BS, + midline surgical incision with intact staples looks clean, dry and intact CNS: AAO x 3, non focal EXT: No edema or tenderness      Data Reviewed:   I have personally reviewed following labs and imaging studies:  Labs: Labs show the following:   Basic Metabolic Panel: Recent Labs  Lab 10/15/22 0931 10/16/22 0559 10/17/22 0454 10/18/22 0219  NA 140 136 139 137  K 3.8 3.3* 3.4* 3.6  CL 105 106 110 108  CO2 25 22 22  20*  GLUCOSE 111* 100* 114* 137*  BUN 23 13 11 13   CREATININE 1.09 0.85 0.83 0.90  CALCIUM 9.0 8.6* 8.8* 8.7*  MG  --   --   --  2.0   GFR Estimated Creatinine Clearance: 86.2 mL/min (by C-G formula based on SCr of 0.9 mg/dL). Liver Function Tests: Recent Labs  Lab 10/15/22 0931  AST 14*  ALT 6  ALKPHOS 79  BILITOT 1.0  PROT 7.0  ALBUMIN 4.2   Recent Labs  Lab 10/15/22 0931  LIPASE 43   No results for input(s): "AMMONIA" in the last 168 hours. Coagulation profile Recent Labs  Lab 10/15/22 1643  INR 1.4*    CBC: Recent Labs  Lab 10/15/22 0931 10/16/22 0559 10/17/22 0454 10/18/22 0219 10/19/22 0332  WBC 7.2 5.7 4.9 14.8* 9.8  NEUTROABS  --  4.2  --   --   --   HGB 14.2 14.1 14.1 13.7 13.0  HCT 43.4 41.4 41.8 40.3 38.5*  MCV 92.5 89.6 90.3 90.0 90.8  PLT 175 148* 147* 145* 124*   Cardiac Enzymes: No results for input(s): "CKTOTAL", "CKMB", "CKMBINDEX", "TROPONINI" in the last 168 hours. BNP (last 3 results) No results for input(s): "PROBNP" in the last 8760 hours. CBG: No results for input(s): "GLUCAP" in  the last 168 hours. D-Dimer: No results for input(s): "DDIMER" in the last 72 hours. Hgb A1c: No results  for input(s): "HGBA1C" in the last 72 hours. Lipid Profile: No results for input(s): "CHOL", "HDL", "LDLCALC", "TRIG", "CHOLHDL", "LDLDIRECT" in the last 72 hours. Thyroid function studies: No results for input(s): "TSH", "T4TOTAL", "T3FREE", "THYROIDAB" in the last 72 hours.  Invalid input(s): "FREET3" Anemia work up: No results for input(s): "VITAMINB12", "FOLATE", "FERRITIN", "TIBC", "IRON", "RETICCTPCT" in the last 72 hours. Sepsis Labs: Recent Labs  Lab 10/15/22 1238 10/15/22 1834 10/16/22 0559 10/17/22 0454 10/18/22 0219 10/19/22 0332  WBC  --   --  5.7 4.9 14.8* 9.8  LATICACIDVEN 0.9 1.2  --   --   --   --     Microbiology No results found for this or any previous visit (from the past 240 hour(s)).  Procedures and diagnostic studies:  No results found.             LOS: 5 days   Blakelyn Dinges  [facility]   Pager on [email or web address]. If 7PM-7AM, please contact night-coverage at [email or web address]     10/20/2022, 2:15 PM

## 2022-10-20 NOTE — TOC PASRR Note (Signed)
RE:  Thomas [name] Date of Birth: 03/30/1951 Date: 10/20/22      To Whom It May Concern:   Please be advised that the above-named patient will require a short-term nursing home stay - anticipated 30 days or less for rehabilitation and strengthening.  The plan is for return home

## 2022-10-20 NOTE — Care Management Important Message (Signed)
Important Message  Patient Details  Name: Thomas [name] MRN: [id number] Date of Birth: [birth year]   Medicare Important Message Given:  N/A - LOS <3 / Initial given by admissions     Johnell Comings 10/20/2022, 8:29 AM

## 2022-10-20 NOTE — Discharge Instructions (Signed)
In addition to included general post-operative instructions,  Diet: Resume home diet.   Activity: No heavy lifting >20 pounds (children, pets, laundry, garbage) or strenuous activity for 6 weeks from date of surgery, but light activity and walking are encouraged. Do not drive or drink alcohol if taking narcotic pain medications or having pain that might distract from driving. Okay to work with physical therapy.   Wound care: You may shower/get incision wet with soapy water and pat dry (do not rub incisions), but no baths or submerging incision underwater until follow-up. We will remove staples at follow up appointment on 07/17  Medications: Resume all home medications. For mild to moderate pain: acetaminophen (Tylenol) or ibuprofen/naproxen (if no kidney disease). Combining Tylenol with alcohol can substantially increase your risk of causing liver disease. Narcotic pain medications, if prescribed, can be used for severe pain, though may cause nausea, constipation, and drowsiness. Do not combine Tylenol and Percocet (or similar) within a 6 hour period as Percocet (and similar) contain(s) Tylenol. If you do not need the narcotic pain medication, you do not need to fill the prescription.  Call office [phone] / [phone]) at any time if any questions, worsening pain, fevers/chills, bleeding, drainage from incision site, or other concerns.

## 2022-10-20 NOTE — TOC Initial Note (Addendum)
Transition of Care [facility]) - Initial/Assessment Note    Patient Details  Name: Thomas [name] MRN: [id number] Date of Birth: [birth year]  [facility] CM/SW Contact:    Chapman Fitch, RN Phone Number: 10/20/2022, 3:34 PM  Clinical Narrative:                    Admitted for: POD 2 colectomy Admitted from: Home with wife PCP: Laural Benes, wife drive to appointments Current home health/prior home health/DME: Rw and hemiwalker  Met with patient and wife Thomas [name] at bedside.  Patient and wifes wishes are for patient to for to SNF PT currently recommending home health, and OT recommending SNF  Patient and wife are aware that PT will reassess  and if they are in agreement that SNF is indicated patient is in agreement to bed search.  PASRR pending.  Fl2 and 30 day note sent to MD to sign Fl2 sent for signature Bed search initiated      Update :  patient declined PT x2 today  Patient Goals and CMS Choice            Expected Discharge Plan and Services                                              Prior Living Arrangements/Services                       Activities of Daily Living Home Assistive Devices/Equipment: Eyeglasses, Dan Humphreys (specify type) ADL Screening (condition at time of admission) Patient's cognitive ability adequate to safely complete daily activities?: Yes Is the patient deaf or have difficulty hearing?: No Does the patient have difficulty seeing, even when wearing glasses/contacts?: No Does the patient have difficulty concentrating, remembering, or making decisions?: No Patient able to express need for assistance with ADLs?: Yes Does the patient have difficulty dressing or bathing?: No Independently performs ADLs?: Yes (appropriate for developmental age) Does the patient have difficulty walking or climbing stairs?: No Weakness of Legs: None Weakness of Arms/Hands: None  Permission Sought/Granted                  Emotional  Assessment              Admission diagnosis:  Sigmoid volvulus (HCC) [K56.2] Patient Active Problem List   Diagnosis Date Noted   Sigmoid volvulus (HCC) 10/15/2022   Arthropathy of lumbar facet joint    Osteoarthritis 06/16/2022   TIA (transient ischemic attack) 06/16/2022   Sciatica 06/16/2022   Major depressive disorder 01/22/2022   Inability to walk 02/22/2021   Herniated lumbar disc without myelopathy 10/11/2020   Post-operative nausea and vomiting 09/20/2020   Activated protein C resistance (HCC) 06/28/2019   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   Insomnia due to medical condition 09/30/2018   BPH (benign prostatic hyperplasia) 09/10/2018   Atrial fibrillation (HCC) 08/23/2015   Erectile dysfunction 03/01/2015   Hypercholesterolemia    Hypertension    DVT (deep venous thrombosis) (HCC)    Parkinson's disease    PCP:  Dorcas Carrow, DO Pharmacy:   [id number] [place], [place] - [address] [place] [address] Phone: [phone] Fax: [phone]  [facility]: [facility] [address], [place] - [address] [place]  [place] [address] Phone: [phone] Fax: [phone]  [facility] [place], [place] - [phone] [place] [address] Phone: [phone] Fax: [phone]     Social Determinants of Health (SDOH) Social History: SDOH Screenings   Food Insecurity: No Food Insecurity (10/15/2022)  Housing: Low Risk  (10/15/2022)  Transportation Needs: No Transportation Needs (10/15/2022)  Utilities: Not At Risk (10/15/2022)  Alcohol Screen: Low Risk  (07/21/2022)  Depression (PHQ2-9): Medium Risk (07/28/2022)  Financial Resource Strain: Low Risk  (07/21/2022)  Physical Activity: Sufficiently Active (07/21/2022)  Social Connections: Socially Integrated (07/21/2022)  Stress: No Stress Concern Present (07/21/2022)   Tobacco Use: Low Risk  (10/20/2022)   SDOH Interventions:     Readmission Risk Interventions     No data to display

## 2022-10-20 NOTE — Progress Notes (Signed)
PT Cancellation Note  Patient Details Name: Thomas [name] MRN: [id number] DOB: [birth year]   Cancelled Treatment:    Reason Eval/Treat Not Completed: Patient declined, no reason specified Pt reported that they were too tired from their OT session this morning and need more rest. Will re attempt this afternoon if time permits.  Malachi Carl, SPT   Malachi Carl 10/20/2022, 12:27 PM

## 2022-10-20 NOTE — Progress Notes (Signed)
PT Cancellation Note  Patient Details Name: Thomas [name] MRN: [id number] DOB: 06-19-1950   Cancelled Treatment:    Reason Eval/Treat Not Completed: Patient declined, no reason specified Pt and Wife requesting that PT come back tomorrow morning at 9:30am so we can see his decrease in performance during the morning. Placed request in PT stick note, will re attempt tomorrow at 9:30.   Malachi Carl, SPT   Malachi Carl 10/20/2022, 1:46 PM

## 2022-10-20 NOTE — Progress Notes (Signed)
[facility] SURGICAL PROGRESS NOTE  Hospital Day(s): 5.   Post op day(s): 3 Days Post-Op.   Interval History:  Patient seen and examined No acute events or new complaints overnight.  Patient reports he is doing well No fever, chills, nausea, emesis No new labs this morning He is advanced to soft diet; tolerating He is having bowel function   Vital signs in last 24 hours: [min-max] current  Temp:  [97.3 F (36.3 C)-98.6 F (37 C)] 97.3 F (36.3 C) (07/08 0459) Pulse Rate:  [64-72] 64 (07/08 0459) Resp:  [17-20] 18 (07/08 0459) BP: (130-170)/(60-87) 149/70 (07/08 0645) SpO2:  [95 %-100 %] 97 % (07/08 0459)     Height: 5\' 9"  (175.3 cm) Weight: 102.4 kg BMI (Calculated): 33.32   Intake/Output last 2 shifts:  07/07 0701 - 07/08 0700 In: 3587.9 [P.O.:480; I.V.:3107.9] Out: 700 [Urine:700]   Physical Exam:  Constitutional: alert, cooperative and no distress  Respiratory: breathing non-labored at rest  Cardiovascular: regular rate and sinus rhythm  Gastrointestinal: soft, non-tender, and non-distended. No rebound/guarding Integumentary: Laparotomy is CDI with staples; no erythema   Labs:     Latest Ref Rng & Units 10/19/2022    3:32 AM 10/18/2022    2:19 AM 10/17/2022    4:54 AM  CBC  WBC 4.0 - 10.5 K/uL 9.8  14.8  4.9   Hemoglobin 13.0 - 17.0 g/dL 11.9  14.7  82.9   Hematocrit 39.0 - 52.0 % 38.5  40.3  41.8   Platelets 150 - 400 K/uL 124  145  147       Latest Ref Rng & Units 10/18/2022    2:19 AM 10/17/2022    4:54 AM 10/16/2022    5:59 AM  CMP  Glucose 70 - 99 mg/dL 562  130  865   BUN 8 - 23 mg/dL 13  11  13    Creatinine 0.61 - 1.24 mg/dL 7.84  6.96  2.95   Sodium 135 - 145 mmol/L 137  139  136   Potassium 3.5 - 5.1 mmol/L 3.6  3.4  3.3   Chloride 98 - 111 mmol/L 108  110  106   CO2 22 - 32 mmol/L 20  22  22    Calcium 8.9 - 10.3 mg/dL 8.7  8.8  8.6      Imaging studies: No new pertinent imaging studies   Assessment/Plan:  72 y.o. male 3 Days  Post-Op s/p sigmoid colectomy for sigmoid volvulus secondary to redundancy of sigmoid colon   - Okay to continue soft / regular diet  - Monitor abdominal examination; on-going bowel function  - Pain control prn; antiemetics prn   - Mobilize; therapies on board   - Further management per primary service   - Discharge Planning: Doing well from surgical perspective, nothing further. We will follow peripherally while in house. Will arrange follow up in ~10 days for staple removal. Updated post-operative instructions as well.    All of the above findings and recommendations were discussed with the patient, patient's family (wife at bedside), and the medical team, and all of patient's and family's questions were answered to their expressed satisfaction.  -- Lynden Oxford, PA-C [facility] 10/20/2022, 7:58 AM M-F: 7am - 4pm

## 2022-10-20 NOTE — NC FL2 (Signed)
[place] MEDICAID [id number] LEVEL OF CARE FORM     IDENTIFICATION  Patient Name: Thomas [name] Birthdate: 01/28/1951 Sex: male Admission Date (Current Location): 10/15/2022  [facility] and [place] Number:  Chiropodist and Address:         Provider Number: [id number]  Attending Physician Name and Address:  Lurene Shadow, MD  Relative Name and Phone Number:       Current Level of Care: Hospital Recommended Level of Care: Skilled Nursing Facility Prior Approval Number:    Date Approved/Denied:   PASRR Number: pending  Discharge Plan: SNF    Current Diagnoses: Patient Active Problem List   Diagnosis Date Noted   Sigmoid volvulus (HCC) 10/15/2022   Arthropathy of lumbar facet joint    Osteoarthritis 06/16/2022   TIA (transient ischemic attack) 06/16/2022   Sciatica 06/16/2022   Major depressive disorder 01/22/2022   Inability to walk 02/22/2021   Herniated lumbar disc without myelopathy 10/11/2020   Post-operative nausea and vomiting 09/20/2020   Activated protein C resistance (HCC) 06/28/2019   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   Insomnia due to medical condition 09/30/2018   BPH (benign prostatic hyperplasia) 09/10/2018   Atrial fibrillation (HCC) 08/23/2015   Erectile dysfunction 03/01/2015   Hypercholesterolemia    Hypertension    DVT (deep venous thrombosis) (HCC)    Parkinson's disease     Orientation RESPIRATION BLADDER Height & Weight     Self, Time, Situation, Place  Normal Continent Weight: 102.4 kg Height:  5\' 9"  (175.3 cm)  BEHAVIORAL SYMPTOMS/MOOD NEUROLOGICAL BOWEL NUTRITION STATUS      Incontinent Diet (soft)  AMBULATORY STATUS COMMUNICATION OF NEEDS Skin   Limited Assist Verbally Surgical wounds                       Personal Care Assistance Level of Assistance              Functional Limitations Info             SPECIAL CARE FACTORS FREQUENCY  PT (By licensed PT), OT (By licensed OT)                     Contractures Contractures Info: Not present    Additional Factors Info  Code Status, Allergies Code Status Info: Full Allergies Info: codeine, general anesthesia           Current Medications (10/20/2022):  This is the current hospital active medication list Current Facility-Administered Medications  Medication Dose Route Frequency Provider Last Rate Last Admin   acetaminophen (TYLENOL) tablet 650 mg  650 mg Oral Q6H PRN Piscoya, Jose, MD   650 mg at 10/19/22 2112   amantadine (SYMMETREL) capsule 100 mg  100 mg Oral TID Piscoya, Elita Quick, MD   100 mg at 10/20/22 1319   amLODipine (NORVASC) tablet 2.5 mg  2.5 mg Oral QHS Piscoya, Jose, MD   2.5 mg at 10/19/22 2111   carbidopa-levodopa (SINEMET CR) 50-200 MG per tablet controlled release 1 tablet  1 tablet Oral QHS Piscoya, Jose, MD   1 tablet at 10/19/22 2111   carbidopa-levodopa (SINEMET IR) 25-100 MG per tablet immediate release 2 tablet  2 tablet Oral QID Henrene Dodge, MD   2 tablet at 10/20/22 1319   cholecalciferol (VITAMIN D3) 25 MCG (1000 UNIT) tablet 1,000 Units  1,000 Units Oral Daily Enedina Finner, MD   1,000 Units at 10/20/22 4540   diclofenac Sodium (VOLTAREN) 1 % topical  gel 4 g  4 g Topical QID PRN Enedina Finner, MD       hydrALAZINE (APRESOLINE) injection 5 mg  5 mg Intravenous Q6H PRN Piscoya, Jose, MD   5 mg at 10/15/22 1702   HYDROmorphone (DILAUDID) injection 0.5 mg  0.5 mg Intravenous Q4H PRN Piscoya, Jose, MD   0.5 mg at 10/17/22 1444   lisinopril (ZESTRIL) tablet 30 mg  30 mg Oral Daily Enedina Finner, MD   30 mg at 10/20/22 4401   methocarbamol (ROBAXIN) tablet 1,000 mg  1,000 mg Oral BID Enedina Finner, MD   1,000 mg at 10/20/22 0448   metoprolol succinate (TOPROL-XL) 24 hr tablet 25 mg  25 mg Oral QHS Enedina Finner, MD   25 mg at 10/19/22 2112   ondansetron (ZOFRAN) injection 4 mg  4 mg Intravenous Q6H PRN Piscoya, Jose, MD   4 mg at 10/17/22 0730   oxybutynin (DITROPAN-XL) 24 hr tablet 10 mg  10 mg Oral  Daily Piscoya, Jose, MD   10 mg at 10/20/22 0272   oxyCODONE (Oxy IR/ROXICODONE) immediate release tablet 5-10 mg  5-10 mg Oral Q4H PRN Henrene Dodge, MD   5 mg at 10/20/22 5366   rivaroxaban (XARELTO) tablet 20 mg  20 mg Oral Q supper Enedina Finner, MD   20 mg at 10/19/22 1731   rOPINIRole (REQUIP) tablet 3 mg  3 mg Oral TID Henrene Dodge, MD   3 mg at 10/20/22 1323   simvastatin (ZOCOR) tablet 40 mg  40 mg Oral q1800 Piscoya, Jose, MD   40 mg at 10/19/22 1729   tamsulosin (FLOMAX) capsule 0.8 mg  0.8 mg Oral Daily Piscoya, Jose, MD   0.8 mg at 10/20/22 4403   traZODone (DESYREL) tablet 50 mg  50 mg Oral QHS Henrene Dodge, MD   50 mg at 10/19/22 2112     Discharge Medications: Please see discharge summary for a list of discharge medications.  Relevant Imaging Results:  Relevant Lab Results:   Additional Information ss [id number]  Chapman Fitch, RN

## 2022-10-20 NOTE — Evaluation (Signed)
Occupational Therapy Evaluation Patient Details Name: Thomas [name] MRN: [id number] DOB: 1951-04-03 Today's Date: 10/20/2022   History of Present Illness Thomas [name] is a 72 y.o. male with a history of hypertension, hypercholesterolemia, atrial fibrillation, DVT, TIA, BPH, and Parkinson's who presents with lower abdominal pain and diarrhea. s/p Open sigmoidectomy with colorectal anastomosis on 7/5.   Clinical Impression   Patient presenting with generalized weakness, decreased activity tolerance, impaired strength, functional mobility and decline in ADL status.  Patient was performing ADLs MOD I with occ LB dressing assist PTA, using a hemiwalker/4WW at home while attending boxing/lifting weights several times weekly. Spouse present for OT eval, anxious about her ability to care for pt at home d/t personal health concerns, and receptive to all OT recommendations/edu throughout eval. Performs bed mobility with modA, initial STS transfer with minA+2 d/t multiple episodes of freezing, but then min guard from toilet after void. Pericare with supervision. Pt reports feeling "lightheaded", SpO2 97% on RA, BP 107/65 (78), RN aware. Functional mobility overall minA for RW mgmt and multimodal cuing for safety, sequencing, and mgmt for IV. Multiple freezing episodes during mobility, unsafe RW mgmt and pt quickly fatiguing. Patient currently functioning below baseline. Patient will benefit from acute OT to increase overall independence in the areas of ADLs, functional mobility, prevent falls, and reduce the risk of rehospitalization in order to safely discharge.  Would benefit from skilled OT services during LOS to address above mentioned deficits and to assist with reducing caregiver burden.      Recommendations for follow up therapy are one component of a multi-disciplinary discharge planning process, led by the attending physician.  Recommendations may be updated based on patient status, additional functional  criteria and insurance authorization.   Assistance Recommended at Discharge Intermittent Supervision/Assistance  Patient can return home with the following A little help with walking and/or transfers;A little help with bathing/dressing/bathroom;Assistance with cooking/housework;Direct supervision/assist for medications management;Direct supervision/assist for financial management;Assist for transportation;Help with stairs or ramp for entrance    Functional Status Assessment  Patient has had a recent decline in their functional status and demonstrates the ability to make significant improvements in function in a reasonable and predictable amount of time.  Equipment Recommendations  BSC/3in1       Precautions / Restrictions Precautions Precautions: Fall Precaution Comments: PD - episodes of freezing during mobility Restrictions Weight Bearing Restrictions: No      Mobility Bed Mobility Overal bed mobility: Needs Assistance Bed Mobility: Sit to Supine       Sit to supine: Mod assist   General bed mobility comments: Difficulties performing bed mobility without HOB elevated (standard bed at home), pt requires MODA for bed mobility using multimodal cuing    Transfers Overall transfer level: Needs assistance Equipment used: Rolling walker (2 wheels) Transfers: Sit to/from Stand Sit to Stand: +2 physical assistance, Min assist, From elevated surface           General transfer comment: Attempted 3x with +1, pt with episodes of freezing and difficulties standing from elevated surface. Called for +2 assist, pt stands with minA +2 with mod multimodal cuing for hand placement and motor planning      Balance Overall balance assessment: Needs assistance Sitting-balance support: Bilateral upper extremity supported, Feet supported Sitting balance-Leahy Scale: Good     Standing balance support: Reliant on assistive device for balance, During functional activity, Bilateral upper  extremity supported Standing balance-Leahy Scale: Fair  ADL either performed or assessed with clinical judgement   ADL Overall ADL's : Needs assistance/impaired Eating/Feeding: Set up                       Toilet Transfer: Minimal assistance Toilet Transfer Details (indicate cue type and reason): Cuing for RW mgmt, and for pauses d/t episodes of freezing Toileting- Clothing Manipulation and Hygiene: Supervision/safety       Functional mobility during ADLs: Minimal assistance;+2 for physical assistance;Cueing for sequencing;Cueing for safety;Rolling walker (2 wheels) General ADL Comments: Pt limited by pain in BUE (shoulders) and discomfort in abdominal incision area. Anticipate minA for UB dressing, modA for LB dressing, overall minA - CGA for mobility t/f bathroom     Vision Baseline Vision/History: 1 Wears glasses              Pertinent Vitals/Pain Pain Assessment Pain Assessment: 0-10 Pain Score: 4  Pain Location: R shoulder Pain Descriptors / Indicators: Discomfort, Dull Pain Intervention(s): Limited activity within patient's tolerance        Extremity/Trunk Assessment Upper Extremity Assessment Upper Extremity Assessment: Generalized weakness   Lower Extremity Assessment Lower Extremity Assessment: Generalized weakness   Cervical / Trunk Assessment Cervical / Trunk Assessment:  (Forward flexed posture during mobility)   Communication Communication Communication: No difficulties   Cognition Arousal/Alertness: Awake/alert, Suspect due to medications (Pt closing eyes at times, had just recieved pain meds per spouse) Behavior During Therapy: WFL for tasks assessed/performed Overall Cognitive Status: Within Functional Limits for tasks assessed                                 General Comments: Cog deficits at baseline     General Comments       Exercises Exercises: Other exercises Other  Exercises Other Exercises: Extensive education provided to pt and spouse regarding AE for LB dressing including sock aid, shoe funnel, LH sponge, reacher to assist with ADL performance. Discussed [facility] programs for outpatient OT/PT when pt has recovered to PLOF for mgmt of Parkinsons. Educated on home safety equiptment, and DME for bathroom   Shoulder Instructions      Home Living Family/patient expects to be discharged to:: Private residence Living Arrangements: Spouse/significant other Available Help at Discharge: Family;Available 24 hours/day Type of Home: House Home Access: Level entry     Home Layout: Two level;Able to live on main level with bedroom/bathroom Alternate Level Stairs-Number of Steps: Flight   Bathroom Shower/Tub: Producer, television/film/video: Standard Bathroom Accessibility: Yes How Accessible: Accessible via walker Home Equipment: Grab bars - toilet;Other (comment);Shower seat;Shower seat - built Charity fundraiser (2 wheels)   Additional Comments: Has 4WW at home. Will need RW at d/c      Prior Functioning/Environment Prior Level of Function : Needs assist       Physical Assist : Mobility (physical);ADLs (physical) Mobility (physical): Bed mobility ADLs (physical): IADLs Mobility Comments: uses a hemiwalker at baseline that wife reports was causing a lot of freezing. ADLs Comments: Pt occ requires assist with bed mobility (uses railing PRN), sometimes LB dressing, but IND 90% of time.        OT Problem List: Decreased strength;Decreased range of motion;Decreased activity tolerance;Impaired balance (sitting and/or standing);Decreased coordination;Decreased cognition;Decreased safety awareness;Decreased knowledge of use of DME or AE;Pain      OT Treatment/Interventions: Self-care/ADL training;Therapeutic exercise;Neuromuscular education;Energy conservation;DME and/or AE instruction;Therapeutic activities;Patient/family education;Balance training  OT Goals(Current goals can be found in the care plan section) Acute Rehab OT Goals Patient Stated Goal: to return to PLOF OT Goal Formulation: With patient/family Time For Goal Achievement: 11/03/22 Potential to Achieve Goals: Good  OT Frequency: Min 1X/week    Co-evaluation              AM-PAC OT "6 Clicks" Daily Activity     Outcome Measure Help from another person eating meals?: None Help from another person taking care of personal grooming?: A Little Help from another person toileting, which includes using toliet, bedpan, or urinal?: A Little Help from another person bathing (including washing, rinsing, drying)?: A Little Help from another person to put on and taking off regular upper body clothing?: A Little Help from another person to put on and taking off regular lower body clothing?: A Little 6 Click Score: 19   End of Session Equipment Utilized During Treatment: Gait belt;Rolling walker (2 wheels) Nurse Communication: Mobility status;Other (comment) (BP 107/65 (78), 76 pulse, SpO2% 97 on RA)  Activity Tolerance: Patient limited by fatigue Patient left: in bed;with call bell/phone within reach;with bed alarm set;with family/visitor present  OT Visit Diagnosis: Unsteadiness on feet (R26.81);Muscle weakness (generalized) (M62.81);Other symptoms and signs involving the nervous system (R29.898)                Time: 1610-9604 OT Time Calculation (min): 60 min Charges:  OT General Charges $OT Visit: 1 Visit OT Evaluation $OT Eval Low Complexity: 1 Low OT Treatments $Self Care/Home Management : 23-37 mins  Stiles Maxcy L. Sophy Mesler, OTR/L  10/20/22, 12:13 PM

## 2022-10-20 NOTE — TOC Progression Note (Signed)
Transition of Care [facility]) - Progression Note    Patient Details  Name: Thomas [name] MRN: [id number] Date of Birth: [birth year]  [facility] CM/SW Contact  Chapman Fitch, RN Phone Number: 10/20/2022, 3:51 PM  Clinical Narrative:      Clinical uploaded to [place] must      Expected Discharge Plan and Services                                               Social Determinants of Health (SDOH) Interventions SDOH Screenings   Food Insecurity: No Food Insecurity (10/15/2022)  Housing: Low Risk  (10/15/2022)  Transportation Needs: No Transportation Needs (10/15/2022)  Utilities: Not At Risk (10/15/2022)  Alcohol Screen: Low Risk  (07/21/2022)  Depression (PHQ2-9): Medium Risk (07/28/2022)  Financial Resource Strain: Low Risk  (07/21/2022)  Physical Activity: Sufficiently Active (07/21/2022)  Social Connections: Socially Integrated (07/21/2022)  Stress: No Stress Concern Present (07/21/2022)  Tobacco Use: Low Risk  (10/20/2022)    Readmission Risk Interventions     No data to display

## 2022-10-21 DIAGNOSIS — K562 Volvulus: Secondary | ICD-10-CM | POA: Diagnosis not present

## 2022-10-21 MED ORDER — POLYETHYLENE GLYCOL 3350 17 GM/SCOOP PO POWD: 17.0000 g | Freq: Every day | ORAL | Status: AC

## 2022-10-21 MED ORDER — ONDANSETRON HCL 4 MG PO TABS: 4.0000 mg | ORAL_TABLET | Freq: Three times a day (TID) | ORAL | 0 refills | Status: DC | PRN

## 2022-10-21 MED ORDER — ONDANSETRON HCL 4 MG PO TABS: 4.0000 mg | ORAL_TABLET | Freq: Three times a day (TID) | ORAL | Status: DC | PRN

## 2022-10-21 NOTE — Progress Notes (Signed)
Patient is not able to walk the distance required to go the bathroom, or he/she is unable to safely negotiate stairs required to access the bathroom.  A 3in1 BSC will alleviate this problem  

## 2022-10-21 NOTE — TOC Transition Note (Signed)
Transition of Care [facility]) - CM/SW Discharge Note   Patient Details  Name: Thomas [name] MRN: [id number] Date of Birth: [birth year]  Transition of Care [facility]) CM/SW Contact:  Chapman Fitch, RN Phone Number: 10/21/2022, 2:43 PM   Clinical Narrative:        Met with patient and wife at bedside Both PT and OT recommending Home health today Patient and wife are not interested in pursing private pay SNF  They selected [facility] for home health. Referral made and accepted by Kandee Keen with [facility]  [facility] delivered to the room by Cletis Athens with adapt  Wife to transport at discharge     Patient Goals and CMS Choice      Discharge Placement                         Discharge Plan and Services Additional resources added to the After Visit Summary for                                       Social Determinants of Health (SDOH) Interventions SDOH Screenings   Food Insecurity: No Food Insecurity (10/15/2022)  Housing: Low Risk  (10/15/2022)  Transportation Needs: No Transportation Needs (10/15/2022)  Utilities: Not At Risk (10/15/2022)  Alcohol Screen: Low Risk  (07/21/2022)  Depression (PHQ2-9): Medium Risk (07/28/2022)  Financial Resource Strain: Low Risk  (07/21/2022)  Physical Activity: Sufficiently Active (07/21/2022)  Social Connections: Socially Integrated (07/21/2022)  Stress: No Stress Concern Present (07/21/2022)  Tobacco Use: Low Risk  (10/20/2022)     Readmission Risk Interventions     No data to display

## 2022-10-21 NOTE — Discharge Summary (Signed)
Physician Discharge Summary   Patient: Thomas [name] MRN: [id number] DOB: 1950/09/10  Admit date:     10/15/2022  Discharge date: 10/21/22  Discharge Physician: Lurene Shadow   PCP: Dorcas Carrow, DO   Recommendations at discharge:   Follow-up with PCP in 1 week Follow-up with Dr. Aleen Campi, general surgeon in 10 days (office will call to schedule appointment)  Discharge Diagnoses: Principal Problem:   Sigmoid volvulus (HCC) Active Problems:   DVT (deep venous thrombosis) (HCC)   Atrial fibrillation (HCC)   Hypertension   Parkinson's disease  Resolved Problems:   * No resolved hospital problems. *  Hospital Course:   Thomas [name] is a 72 y.o. male with medical history significant of Parkinson's disease with mild neurocognitive disorder, hypertension, atrial fibrillation on Xarelto, hyperlipidemia, previous DVT, BPH, TIA, who presented to the hospital with abdominal pain and diarrhea.  He previously had constipation and took an enema for this.  Subsequently, he developed watery diarrhea that lasted approximately 5 days.    Assessment and Plan:   Sigmoid volvulus: S/p decompressive sigmoidoscopy with rectal tube placement on 10/15/2022.  S/p open sigmoidectomy with colorectal anastomosis on 10/17/2022.  He is tolerating a soft diet.  He is okay for discharge from general surgical standpoint.    Atrial fibrillation, history of TIA: Continue Xarelto     History of recurrent DVT and PE in 2016 and 2020: Continue Xarelto     Hypokalemia: Improved     Parkinson's disease: Continue Sinemet, ropinirole and amantadine     Other comorbidities include hypertension, hypercholesterolemia, depression, BPH, arthritis, activated protein C resistance     Debility: He did well with PT and OT today. Home health therapy was recommended.   His condition has improved and he is deemed stable for discharge to home today.  Discharge plan was discussed with the patient and his wife at the  bedside.       Consultants: General surgeon Procedures performed: Open sigmoidectomy Disposition: Home health Diet recommendation:  Discharge Diet Orders (From admission, onward)     Start     Ordered   10/21/22 0000  Diet - low sodium heart healthy        10/21/22 1301           Cardiac diet DISCHARGE MEDICATION: Allergies as of 10/21/2022       Reactions   Codeine Other (See Comments)   Hallucinations   Other Nausea Only   General anesthesia         Medication List     TAKE these medications    acetaminophen 500 MG tablet Commonly known as: TYLENOL Take 1,000 mg by mouth daily.   amantadine 100 MG capsule Commonly known as: SYMMETREL TAKE ONE CAPSULE BY MOUTH THREE TIMES A DAY   amLODipine 2.5 MG tablet Commonly known as: NORVASC TAKE 1 TABLET (2.5 MG TOTAL) BY MOUTH DAILY AS NEEDED (BLOOD PRESSURE GREATER THAN 160/90). TAKE IN THE MORNING What changed:  how much to take how to take this when to take this   calcium carbonate 500 MG chewable tablet Commonly known as: TUMS - dosed in mg elemental calcium Chew 1 tablet by mouth daily as needed for indigestion or heartburn.   carbidopa-levodopa 25-100 MG tablet Commonly known as: SINEMET IR TAKE TWO TABLETS BY MOUTH FOUR TIMES A DAY AT 6AM, 10AM, 2PM. AND 6PM. What changed:  how much to take when to take this   carbidopa-levodopa 50-200 MG tablet Commonly known as: SINEMET CR TAKE 1  TABLET BY MOUTH AT BEDTIME What changed: Another medication with the same name was changed. Make sure you understand how and when to take each.   cetirizine 10 MG tablet Commonly known as: ZYRTEC Take 10 mg by mouth at bedtime.   cholecalciferol 25 MCG (1000 UNIT) tablet Commonly known as: VITAMIN D3 Take 1,000 Units by mouth daily.   diclofenac Sodium 1 % Gel Commonly known as: VOLTAREN APPLY TOPICALLY 4 GRAMS FOUR TIMES DAILY What changed: See the new instructions.   hydrALAZINE 25 MG tablet Commonly known  as: APRESOLINE Take 1 tablet (25 mg total) by mouth 3 (three) times daily as needed (for SBP >160). Takes in the evening   HYDROcodone-acetaminophen 5-325 MG tablet Commonly known as: NORCO/VICODIN Take 1 tablet by mouth 2 (two) times daily.   Inbrija 42 MG Caps Generic drug: Levodopa Samples of this drug were given to the patient, quantity 1 box of 32 capsules, Lot Number [id number] Exp: 03/2023  Patient has been educated on dosage and uses of medication.   Inbrija 42 MG Caps Generic drug: Levodopa Samples of this drug were given to the patient, quantity 1, Lot Number [id number] Exp 12-24   lisinopril 30 MG tablet Commonly known as: ZESTRIL Take 1 tablet (30 mg total) by mouth daily.   melatonin 5 MG Tabs Take 5 mg by mouth at bedtime.   methocarbamol 500 MG tablet Commonly known as: ROBAXIN Take 1 tablet (500 mg total) by mouth every 6 (six) hours as needed for muscle spasms.   metoprolol succinate 25 MG 24 hr tablet Commonly known as: TOPROL-XL Take 1 tablet (25 mg total) by mouth at bedtime.   multivitamin tablet Take 1 tablet by mouth daily at 6 PM.   ondansetron 4 MG tablet Commonly known as: ZOFRAN Take 1 tablet (4 mg total) by mouth every 8 (eight) hours as needed for nausea or vomiting.   oxybutynin 10 MG 24 hr tablet Commonly known as: DITROPAN-XL Take 1 tablet (10 mg total) by mouth daily.   polyethylene glycol powder 17 GM/SCOOP powder Commonly known as: GLYCOLAX/MIRALAX Take 17 g by mouth at bedtime.   PreviDent 5000 Booster Plus 1.1 % Pste Generic drug: Sodium Fluoride Place 1 application onto teeth 2 (two) times daily.   rivaroxaban 20 MG Tabs tablet Commonly known as: Xarelto Take 1 tablet (20 mg total) by mouth daily.   rOPINIRole 3 MG tablet Commonly known as: REQUIP TAKE 1 TABLET BY MOUTH 3 TIMES A DAY What changed:  how much to take how to take this when to take this additional instructions   simvastatin 40 MG tablet Commonly known  as: ZOCOR Take 1 tablet (40 mg total) by mouth daily.   Systane Ultra 0.4-0.3 % Soln Generic drug: Polyethyl Glycol-Propyl Glycol Place 1 drop into both eyes daily as needed (DRY EYES).   tamsulosin 0.4 MG Caps capsule Commonly known as: FLOMAX Take 2 capsules (0.8 mg total) by mouth daily.   traZODone 50 MG tablet Commonly known as: DESYREL Take 1 tablet (50 mg total) by mouth at bedtime.               Durable Medical Equipment  (From admission, onward)           Start     Ordered   10/21/22 1105  For home use only DME Bedside commode  Once       Question:  Patient needs a bedside commode to treat with the following condition  Answer:  Weakness  10/21/22 1111            Follow-up Information     Henrene Dodge, MD. Go on 10/29/2022.   Specialty: General Surgery Why: Go to appointment on 07/17 at 215 PM Contact information: [address] [place] [address] [phone]                Discharge Exam: Ceasar Mons Weights   10/15/22 1642 10/15/22 1842 10/17/22 0717  Weight: 99.8 kg 102.4 kg 102.4 kg   GEN: NAD SKIN: Warm and dry EYES: No pallor or icterus ENT: MMM CV: RRR PULM: CTA B ABD: soft, ND, NT, +BS, + midline incisional surgical wound is clean, dry and intact.  Staples are intact. CNS: AAO x 3, non focal EXT: No edema or tenderness   Condition at discharge: good  The results of significant diagnostics from this hospitalization (including imaging, microbiology, ancillary and laboratory) are listed below for reference.   Imaging Studies: DG Abd 2 Views  Result Date: 10/16/2022 CLINICAL DATA:  Sigmoid volvulus.  Follow-up exam. EXAM: ABDOMEN - 2 VIEW COMPARISON:  10/15/2022 FINDINGS: Unchanged position of the rectal tube which terminates over the left upper quadrant of the abdomen in the expected location of the mid sigmoid colon. Gaseous distension of the sigmoid colon has improved when compared with the original CT from  10/15/22. This currently measures approximately 7.4 cm in diameter compared with 9.1 cm previously. Persistent moderate stool burden within the right colon. Paucity of bowel gas noted within the pelvis beyond the level of the previously noted transition point at the distal sigmoid colon. IMPRESSION: 1. Unchanged position of the rectal tube which terminates over the left upper quadrant of the abdomen in the expected location of the mid sigmoid colon. 2. Gaseous distension of the sigmoid colon has improved when compared with the original CT from 10/15/22. 3. Continued paucity of bowel gas within the rectum. Electronically Signed   By: Signa Kell M.D.   On: 10/16/2022 07:01   DG Abd 1 View  Result Date: 10/15/2022 CLINICAL DATA:  Sigmoid volvulus.  Rectal tube placement EXAM: ABDOMEN - 1 VIEW COMPARISON:  CT earlier 10/15/2018 FINDINGS: Presumed rectal tube in place with the tip extending into the epigastric region in the coursing to the left midabdomen. There continues to be some dilated colon with a large amount of right-sided colonic stool. Level of dilatation of colonic loops appears similar to the prior CT. Small bowel is nondilated. No definite free air on these portable supine radiographs. Curvature and degenerative changes of the spine. Osteopenia. There is some contrast in the renal collecting systems and bladder from prior CT scan IMPRESSION: Placement of rectal tube. Continued distention of colon with significant right-sided stool Electronically Signed   By: Karen Kays M.D.   On: 10/15/2022 16:24   CT ABDOMEN PELVIS W CONTRAST  Result Date: 10/15/2022 CLINICAL DATA:  Left lower quadrant pain EXAM: CT ABDOMEN AND PELVIS WITH CONTRAST TECHNIQUE: Multidetector CT imaging of the abdomen and pelvis was performed using the standard protocol following bolus administration of intravenous contrast. RADIATION DOSE REDUCTION: This exam was performed according to the departmental dose-optimization program  which includes automated exposure control, adjustment of the mA and/or kV according to patient size and/or use of iterative reconstruction technique. CONTRAST:  OMNIPAQUE IOHEXOL 300 MG/ML  SOLN COMPARISON:  None Available. FINDINGS: Lower chest: There is some linear opacity lung bases likely scar or atelectasis. No pleural effusion. Coronary artery calcifications are seen. Patulous lower  esophagus. Hepatobiliary: No focal liver abnormality is seen. No gallstones, gallbladder wall thickening, or biliary dilatation. Small appearing left hepatic lobe. Pancreas: Unremarkable. No pancreatic ductal dilatation or surrounding inflammatory changes. Spleen: Normal in size without focal abnormality. Adrenals/Urinary Tract: Adrenal glands are preserved. No enhancing renal mass or collecting system dilatation. There is parapelvic left-sided renal cyst. The ureters have normal course and caliber extending down to the bladder. Preserved contours of the urinary bladder. Stomach/Bowel: Subtle wall thickening along the rectum. Just proximal this there is a twisting of the mesentery as best appreciated on the coronal data set with swirling of the distal sigmoid colon the sigmoid colon extends into the right upper quadrant, up to the level of the diaphragm and is distended with a diameter approaching 6.5 cm, mildly dilated. This could be a sigmoid volvulus with only partial obstruction. Focal caliber change identified at the swirling, twisting location in the upper central pelvis as seen on axial series 2, image 61. The more proximal portion of the bowel to this with a large amount of stool but no dilatation. Appendix is not well seen. The stomach and small bowel are nondilated. Vascular/Lymphatic: Aortic atherosclerosis. No enlarged abdominal or pelvic lymph nodes. Reproductive: Prostate is unremarkable. Other: No free air or free fluid. No portal venous gas or pneumatosis. Small fat containing inguinal hernias.  Musculoskeletal: Moderate degenerative changes of the spine and pelvis. There is grade 2 anterolisthesis with pars defects at L4-5. There is fusion across the disc space. Critical Value/emergent results were called by telephone at the time of interpretation on 10/15/2022 at 9:21 am to provider [facility] , who verbally acknowledged these results. IMPRESSION: Twisting appearance to the mesentery in the upper central pelvis and along the distal sigmoid colon with a caliber change at the level of the twist and a mildly dilated appearance to the sigmoid colon extending up to the diaphragm. Based on the appearance this could represent early changes of sigmoid volvulus with a partial obstruction. No inflammatory changes, pneumatosis or more significant bowel dilatation proximal. Please correlate with clinical presentation. Of note proximal this there is a large amount of stool in the colon. There is also some slight wall thickening along the rectum with some stranding. Please correlate for proctitis. Electronically Signed   By: Karen Kays M.D.   On: 10/15/2022 12:25    Microbiology: Results for orders placed or performed in visit on 09/01/22  Microscopic Examination     Status: Abnormal   Collection Time: 09/01/22 10:02 AM   Urine  Result Value Ref Range Status   WBC, UA 0-5 0 - 5 /hpf Final   RBC, Urine 0-2 0 - 2 /hpf Final   Epithelial Cells (non renal) 0-10 0 - 10 /hpf Final   Mucus, UA Present (A) Not Estab. Final   Bacteria, UA Moderate (A) None seen/Few Final    Labs: CBC: Recent Labs  Lab 10/15/22 0931 10/16/22 0559 10/17/22 0454 10/18/22 0219 10/19/22 0332  WBC 7.2 5.7 4.9 14.8* 9.8  NEUTROABS  --  4.2  --   --   --   HGB 14.2 14.1 14.1 13.7 13.0  HCT 43.4 41.4 41.8 40.3 38.5*  MCV 92.5 89.6 90.3 90.0 90.8  PLT 175 148* 147* 145* 124*   Basic Metabolic Panel: Recent Labs  Lab 10/15/22 0931 10/16/22 0559 10/17/22 0454 10/18/22 0219  NA 140 136 139 137  K 3.8 3.3* 3.4*  3.6  CL 105 106 110 108  CO2 25 22 22  20*  GLUCOSE 111* 100* 114* 137*  BUN 23 13 11 13   CREATININE 1.09 0.85 0.83 0.90  CALCIUM 9.0 8.6* 8.8* 8.7*  MG  --   --   --  2.0   Liver Function Tests: Recent Labs  Lab 10/15/22 0931  AST 14*  ALT 6  ALKPHOS 79  BILITOT 1.0  PROT 7.0  ALBUMIN 4.2   CBG: No results for input(s): "GLUCAP" in the last 168 hours.  Discharge time spent: greater than 30 minutes.  Signed: Lurene Shadow, MD [facility] 10/21/2022

## 2022-10-21 NOTE — Progress Notes (Addendum)
Mobility Specialist - Progress Note   10/21/22 1427  Mobility  Activity Ambulated with assistance in room  Level of Assistance Standby assist, set-up cues, supervision of patient - no hands on  Assistive Device Front wheel walker  Distance Ambulated (ft) 40 ft  Activity Response Tolerated well  $Mobility charge 1 Mobility  Mobility Specialist Start Time (ACUTE ONLY) 1410  Mobility Specialist Stop Time (ACUTE ONLY) 1426  Mobility Specialist Time Calculation (min) (ACUTE ONLY) 16 min   Pt sitting in the recliner upon entry, utilizing RA. Pt expresses feeling nauseous, opting to amb within then the room. Pt STS from recliner to RW SBA-- wide BOS and looking down at feet upon standing. Pt amb to the door within the room x2 SBA-supervision, fairly fast gait. Pt left seated in the recliner with alarm set and needs within reach.   Zetta Bills Mobility Specialist 10/21/22 2:33 PM

## 2022-10-21 NOTE — Progress Notes (Signed)
I have reviewed and concur with this student's documentation. CI, RN was present during med administration.   Leodis Sias, RN 10/21/2022 1:53 PM

## 2022-10-21 NOTE — Progress Notes (Signed)
Occupational Therapy Treatment Patient Details Name: Thomas [name] MRN: [id number] DOB: September 30, 1950 Today's Date: 10/21/2022   History of present illness Thomas [name] is a 72 y.o. male with a history of hypertension, hypercholesterolemia, atrial fibrillation, DVT, TIA, BPH, and Parkinson's who presents with lower abdominal pain and diarrhea. s/p Open sigmoidectomy with colorectal anastomosis on 7/5.   OT comments  Pt received in bed, reporting d/c today. Pt wanting to update wife, slightly anxious. OT assists with 3x attempts to call spouse per pt request as he is unable to remember her phone number (line busy). See flowsheets for additional details of ADL performance this date. Performs transfers, functional mobility, toileting tasks, UB/LB spongebath + dressing including donning socks + shoes with overall setup/supervision. Pt taking rest breaks as needed. Pt transfers from EOB to recliner, reports feeling slightly nauseated. Discussed energy conservation strategies. Pt left in recliner, lunch tray nearby with all needs within reach and chair alarm activated. RN aware of pt status. Pt denies further questions regarding safety and fall prevention recommendations.     Recommendations for follow up therapy are one component of a multi-disciplinary discharge planning process, led by the attending physician.  Recommendations may be updated based on patient status, additional functional criteria and insurance authorization.    Assistance Recommended at Discharge Intermittent Supervision/Assistance  Patient can return home with the following  A little help with walking and/or transfers;A little help with bathing/dressing/bathroom;Assistance with cooking/housework;Direct supervision/assist for medications management;Direct supervision/assist for financial management;Assist for transportation;Help with stairs or ramp for entrance   Equipment Recommendations  BSC/3in1       Precautions / Restrictions  Precautions Precautions: Fall Precaution Comments: PD - no freezing noted today Restrictions Weight Bearing Restrictions: No       Mobility Bed Mobility Overal bed mobility: Needs Assistance Bed Mobility: Supine to Sit, Sit to Supine     Supine to sit: Supervision Sit to supine: Supervision   General bed mobility comments: rail used, bed flat and and height set to simulate bed at home    Transfers Overall transfer level: Needs assistance Equipment used: Rolling walker (2 wheels) Transfers: Sit to/from Stand Sit to Stand: Supervision           General transfer comment: cues for hand placements but no physical assist     Balance Overall balance assessment: Needs assistance Sitting-balance support: No upper extremity supported, Feet supported Sitting balance-Leahy Scale: Good     Standing balance support: No upper extremity supported (Stands with narrow BOS and reaches for item on counter, no LOB) Standing balance-Leahy Scale: Fair                             ADL either performed or assessed with clinical judgement   ADL Overall ADL's : Needs assistance/impaired     Grooming: Wash/dry hands;Wash/dry face;Set up;Standing;Sitting Grooming Details (indicate cue type and reason): Performs hand washing standing at sink with supervision, wash/dry face seated EOB setup Upper Body Bathing: Set up;Sitting Upper Body Bathing Details (indicate cue type and reason): Pt sponge bathes sitting EOB with min setup Lower Body Bathing: Set up;Sit to/from stand   Upper Body Dressing : Supervision/safety Upper Body Dressing Details (indicate cue type and reason): Dons shirt with supervision seated EOB Lower Body Dressing: Sit to/from stand;Set up Lower Body Dressing Details (indicate cue type and reason): Dons shorts seated EOB with figure four, setup Toilet Transfer: Supervision/safety;Ambulation;BSC/3in1   Toileting- Architect and Hygiene:  Supervision/safety  Functional mobility during ADLs: Supervision/safety General ADL Comments: Performs ADL routine with setup/supervision      Cognition Arousal/Alertness: Awake/alert Behavior During Therapy: WFL for tasks assessed/performed Overall Cognitive Status: Within Functional Limits for tasks assessed                                                     Pertinent Vitals/ Pain       Pain Assessment Pain Assessment: No/denies pain   Frequency  Min 1X/week        Progress Toward Goals  OT Goals(current goals can now be found in the care plan section)  Progress towards OT goals: Progressing toward goals  Acute Rehab OT Goals OT Goal Formulation: With patient/family Time For Goal Achievement: 11/03/22 Potential to Achieve Goals: Good  Plan Discharge plan needs to be updated       AM-PAC OT "6 Clicks" Daily Activity     Outcome Measure   Help from another person eating meals?: None Help from another person taking care of personal grooming?: None Help from another person toileting, which includes using toliet, bedpan, or urinal?: A Little Help from another person bathing (including washing, rinsing, drying)?: A Little Help from another person to put on and taking off regular upper body clothing?: A Little Help from another person to put on and taking off regular lower body clothing?: A Little 6 Click Score: 20    End of Session Equipment Utilized During Treatment: Gait belt;Rolling walker (2 wheels)  OT Visit Diagnosis: Unsteadiness on feet (R26.81);Muscle weakness (generalized) (M62.81);Other symptoms and signs involving the nervous system (R29.898)   Activity Tolerance Patient limited by fatigue   Patient Left with call bell/phone within reach;in chair;with chair alarm set   Nurse Communication Mobility status        Time: 1310-1346 OT Time Calculation (min): 36 min  Charges: OT General Charges $OT Visit: 1 Visit OT  Treatments $Self Care/Home Management : 23-37 mins  Thomas [name], OTR/L  10/21/22, 1:48 PM

## 2022-10-21 NOTE — Progress Notes (Signed)
Physical Therapy Treatment Patient Details Name: Thomas [name] MRN: [id number] DOB: 03-Feb-1951 Today's Date: 10/21/2022   History of Present Illness Thomas [name] is a 72 y.o. male with a history of hypertension, hypercholesterolemia, atrial fibrillation, DVT, TIA, BPH, and Parkinson's who presents with lower abdominal pain and diarrhea. s/p Open sigmoidectomy with colorectal anastomosis on 7/5.    PT Comments  Pt in bed, ready for session.  Wife and friends in room.  Simulated bed set up for home.  He is able to get in/out of bed with supervision.  Completes x 1 lap on unit with RW and contact guard/supervision.  He has generally good gait speed as expected for parkinsons diagnosis and no freezing noted as in prior session.  He does have some decreased step length and height and narrow BOS as would be expected for Parkinson's but does not have any LOB or buckling or incidents needing outside intervention.  Returned to room to supine with supervision when he stated he needed to use bathroom.  Again in/out of bed with supervision, walks to bathroom with RW where is is able to attend to his own clothing and care needs without assist, and washes his hands at sink without UE support.    Discussed at length with wife and friends in room.  Wife voices general anxiety about caring for him at home and his mobility.  Overall he has demonstrated significant improvement during his recovery here and is returning to baseline mobility.  While he is at increased risk for falls due to Parkinson's, he has demonstrated safe household mobility.  She voices concerns over a rug that she wants removed and a shower rail that she is encouraged to address today if that is a priority for her before discharge.  A bedside commode is recommended for ease at home.  Discussed at length with team.  Will see if mobility team can see pt again this PM to increase activity on unit prior to discharge.    Assistance Recommended at Discharge     If plan is discharge home, recommend the following:  Can travel by private vehicle    A little help with walking and/or transfers;A little help with bathing/dressing/bathroom;Assistance with cooking/housework;Assist for transportation;Help with stairs or ramp for entrance      Equipment Recommendations  Rolling walker (2 wheels)    Recommendations for Other Services       Precautions / Restrictions Precautions Precautions: Fall Precaution Comments: PD - no freezing noted today Restrictions Weight Bearing Restrictions: No     Mobility  Bed Mobility Overal bed mobility: Needs Assistance Bed Mobility: Supine to Sit, Sit to Supine     Supine to sit: Supervision Sit to supine: Supervision   General bed mobility comments: rail used, bed flat and and height set to simulate bed at home Patient Response: Cooperative  Transfers Overall transfer level: Needs assistance Equipment used: Rolling walker (2 wheels)   Sit to Stand: Supervision           General transfer comment: cues for hand placements but no physical assist    Ambulation/Gait Ambulation/Gait assistance: Supervision Gait Distance (Feet): 180 Feet Assistive device: Rolling walker (2 wheels) Gait Pattern/deviations: Shuffle, Step-through pattern, Step-to pattern, Decreased step length - left, Decreased step length - right, Narrow base of support, Trunk flexed Gait velocity: slightly decreased but no freezing as noted last session     General Gait Details: completed x 1 lap on unit and to/from bathroom with RW and supervision.   Stairs  Wheelchair Mobility     Tilt Bed Tilt Bed Patient Response: Cooperative  Modified Rankin (Stroke Patients Only)       Balance Overall balance assessment: Needs assistance Sitting-balance support: Bilateral upper extremity supported, Feet supported Sitting balance-Leahy Scale: Good     Standing balance support: Reliant on assistive device for  balance, During functional activity, Bilateral upper extremity supported Standing balance-Leahy Scale: Fair                              Cognition Arousal/Alertness: Awake/alert Behavior During Therapy: WFL for tasks assessed/performed Overall Cognitive Status: Within Functional Limits for tasks assessed                                          Exercises Other Exercises Other Exercises: to bathroo to void and small BM.  attended to his own care needs.    General Comments        Pertinent Vitals/Pain Pain Assessment Pain Assessment: 0-10 Pain Score: 2  Pain Location: abdomen with mobility Pain Descriptors / Indicators: Discomfort, Dull Pain Intervention(s): Limited activity within patient's tolerance, Monitored during session, Repositioned    Home Living                          Prior Function            PT Goals (current goals can now be found in the care plan section) Progress towards PT goals: Progressing toward goals    Frequency    Min 1X/week      PT Plan Current plan remains appropriate    Co-evaluation              AM-PAC PT "6 Clicks" Mobility   Outcome Measure  Help needed turning from your back to your side while in a flat bed without using bedrails?: None Help needed moving from lying on your back to sitting on the side of a flat bed without using bedrails?: A Little Help needed moving to and from a bed to a chair (including a wheelchair)?: A Little Help needed standing up from a chair using your arms (e.g., wheelchair or bedside chair)?: A Little Help needed to walk in hospital room?: A Little Help needed climbing 3-5 steps with a railing? : A Little 6 Click Score: 19    End of Session Equipment Utilized During Treatment: Gait belt Activity Tolerance: Patient tolerated treatment well Patient left: in bed;with call bell/phone within reach;with bed alarm set;with family/visitor present;with  nursing/sitter in room Nurse Communication: Mobility status PT Visit Diagnosis: Unsteadiness on feet (R26.81);Other abnormalities of gait and mobility (R26.89);Muscle weakness (generalized) (M62.81);Difficulty in walking, not elsewhere classified (R26.2);Pain     Time: 1610-9604 PT Time Calculation (min) (ACUTE ONLY): 39 min  Charges:    $Gait Training: 23-37 mins $Therapeutic Activity: 8-22 mins PT General Charges $$ ACUTE PT VISIT: 1 Visit                   Danielle Dess, PTA 10/21/22, 11:25 AM

## 2022-10-22 ENCOUNTER — Telehealth: Payer: Self-pay | Admitting: *Deleted

## 2022-10-22 ENCOUNTER — Encounter: Payer: Self-pay | Admitting: *Deleted

## 2022-10-22 NOTE — Transitions of Care (Post Inpatient/ED Visit) (Signed)
10/22/2022  Name: Thomas [name] MRN: [id number] DOB: [birth year]  Today's TOC FU Call Status: Today's TOC FU Call Status:: Successful TOC FU Call Competed TOC FU Call Complete Date: 10/22/22  Transition Care Management Follow-up Telephone Call Date of Discharge: 10/21/22 Discharge Facility: [facility]) Type of Discharge: Inpatient Admission Primary Inpatient Discharge Diagnosis:: abdominal pain; diarrhea-- sigmoid volvulus with surgical resection How have you been since you were released from the hospital?: Better (per spouse: "He is doing okay, still having loose stools, we are monitoring everything around his stools; he is eating better gradually.  Have not heard from home health yet; his blood pressure was low this morning at 98/55.  I am watching him closely") Any questions or concerns?: Yes Patient Questions/Concerns:: Low BP's post-hospital discharge/ surgery; have not yet heard from home health- no contact infomation included on AVS discharge instructions for home health agency [facility]) Patient Questions/Concerns Addressed: Other: (advised to continue monitoring BP's; confirmed no signs/ symptoms low blood pressure- just lower values-- takes some BP meds only prn at baseline; care coordination outreach to [facility]- confirmed [place] office covers [place] area- awaiting call back)  12: 57 pm:  Received call back confirmation from [facility] [place] that referral for RN, PT, OT has been received and is in progress- they will call to schedule with patient by tomorrow-- contacted patient's spouse and updated her accordingly  Items Reviewed: Did you receive and understand the discharge instructions provided?: Yes (thoroughly reviewed with patient who verbalizes good understanding of same) Medications obtained,verified, and reconciled?: Yes (Medications Reviewed) (Full medication reconciliation/ review completed; no concerns or discrepancies identified; confirmed patient  obtained/ is taking all newly Rx'd medications as instructed; self-manages medications and denies questions/ concerns around medications today) Any new allergies since your discharge?: No Dietary orders reviewed?: Yes Type of Diet Ordered:: "Bland; progressing slowly, eating small amounts at a time" Do you have support at home?: Yes People in Home: spouse Name of Support/Comfort Primary Source: Resides with spouse; reports essentially independent in self-care activities; supportive spouse assists as/ if needed/ indicated  Medications Reviewed Today: Medications Reviewed Today     Reviewed by Michaela Corner, RN (Registered Nurse) on 10/22/22 at 1221  Med List Status: <None>   Medication Order Taking? Sig Documenting Provider Last Dose Status Informant  acetaminophen (TYLENOL) 500 MG tablet [id number] Yes Take 1,000 mg by mouth daily. [provider] Taking Active Spouse/Significant Other  amantadine (SYMMETREL) 100 MG capsule [id number] Yes TAKE ONE CAPSULE BY MOUTH THREE TIMES A DAY  Patient taking differently: Take 100 mg by mouth 3 (three) times daily.   Vladimir Faster, DO Taking Active Spouse/Significant Other           Med Note Sharia Reeve   Wed Oct 15, 2022  1:42 PM) Patient's doses at 0600, 1400, 2200  amLODipine (NORVASC) 2.5 MG tablet [id number] Yes TAKE 1 TABLET (2.5 MG TOTAL) BY MOUTH DAILY AS NEEDED (BLOOD PRESSURE GREATER THAN 160/90). TAKE IN THE MORNING  Patient taking differently: Take 2.5 mg by mouth at bedtime. TAKE 1 TABLET (2.5 MG TOTAL) BY MOUTH DAILY AS NEEDED (BLOOD PRESSURE GREATER THAN 160/90). TAKE IN THE MORNING   Johnson, Megan P, DO Taking Active Spouse/Significant Other  calcium carbonate (TUMS - DOSED IN MG ELEMENTAL CALCIUM) 500 MG chewable tablet [id number] Yes Chew 1 tablet by mouth daily as needed for indigestion or heartburn. [provider] Taking Active Spouse/Significant Other  carbidopa-levodopa (SINEMET CR) 50-200 MG tablet  [id number]  Yes TAKE 1 TABLET BY MOUTH AT BEDTIME Tat, Octaviano Batty, DO Taking Active   carbidopa-levodopa (SINEMET IR) 25-100 MG tablet [id number] Yes TAKE TWO TABLETS BY MOUTH FOUR TIMES A DAY AT 6AM, 10AM, 2PM. AND 6PM.  Patient taking differently: 2 tablets 4 (four) times daily. TAKE TWO TABLETS BY MOUTH FOUR TIMES A DAY AT 6AM, 10AM, 2PM. AND 6PM.   Tat, Octaviano Batty, DO Taking Active Spouse/Significant Other  cetirizine (ZYRTEC) 10 MG tablet [id number] Yes Take 10 mg by mouth at bedtime. [provider] Taking Active Spouse/Significant Other  cholecalciferol (VITAMIN D3) 25 MCG (1000 UNIT) tablet [id number] Yes Take 1,000 Units by mouth daily. [provider] Taking Active Spouse/Significant Other  diclofenac Sodium (VOLTAREN) 1 % GEL [id number] Yes APPLY TOPICALLY 4 GRAMS FOUR TIMES DAILY  Patient taking differently: Apply 4 g topically 4 (four) times daily as needed (pain).   Dorcas Carrow, DO Taking Active Spouse/Significant Other  hydrALAZINE (APRESOLINE) 25 MG tablet [id number] Yes Take 1 tablet (25 mg total) by mouth 3 (three) times daily as needed (for SBP >160). Takes in the evening Gollan, Tollie Pizza, MD Taking Active Spouse/Significant Other           Med Note Otelia Limes Oct 22, 2022 11:50 AM) 10/22/22: reports during [facility] call has not needed post- hospital discharge on 10/21/22; monitoring BP's at home  HYDROcodone-acetaminophen (NORCO/VICODIN) 5-325 MG tablet [id number] Yes Take 1 tablet by mouth 2 (two) times daily. [provider] Taking Active Spouse/Significant Other  Levodopa (INBRIJA) 42 MG CAPS [id number] Yes Samples of this drug were given to the patient, quantity 1 box of 32 capsules, Lot Number [id number] Exp: 03/2023  Patient has been educated on dosage and uses of medication. Vladimir Faster, DO Taking Active Spouse/Significant Other  Levodopa (INBRIJA) 42 MG CAPS [id number] Yes Samples of this drug were given to the patient, quantity 1, Lot Number  [id number] Exp 12-24 Tat, Rebecca S, DO Taking Active Spouse/Significant Other  lisinopril (ZESTRIL) 30 MG tablet [id number] Yes Take 1 tablet (30 mg total) by mouth daily. Olevia Perches P, DO Taking Active Spouse/Significant Other  melatonin 5 MG TABS [id number] Yes Take 5 mg by mouth at bedtime. [provider] Taking Active Spouse/Significant Other  methocarbamol (ROBAXIN) 500 MG tablet [id number] Yes Take 1 tablet (500 mg total) by mouth every 6 (six) hours as needed for muscle spasms. Council Mechanic, NP Taking Active Spouse/Significant Other           Med Note Sharia Reeve   Wed Oct 15, 2022  1:53 PM) Per Spouse, Patient takes 1000 mg twice daily at 0600, 2200  metoprolol succinate (TOPROL-XL) 25 MG 24 hr tablet [id number] Yes Take 1 tablet (25 mg total) by mouth at bedtime. Dorcas Carrow, DO Taking Active Spouse/Significant Other  Multiple Vitamin (MULTIVITAMIN) tablet [id number] Yes Take 1 tablet by mouth daily at 6 PM. [provider] Taking Active Spouse/Significant Other  ondansetron (ZOFRAN) 4 MG tablet [id number] Yes Take 1 tablet (4 mg total) by mouth every 8 (eight) hours as needed for nausea or vomiting. Lurene Shadow, MD Taking Active   oxybutynin (DITROPAN-XL) 10 MG 24 hr tablet [id number] Yes Take 1 tablet (10 mg total) by mouth daily. Alfredo Martinez, MD Taking Active Spouse/Significant Other  Polyethyl Glycol-Propyl Glycol (SYSTANE ULTRA) 0.4-0.3 % SOLN [id number] Yes Place 1 drop into both eyes daily as needed (DRY EYES). [provider] Taking Active Spouse/Significant Other  Med Note Michaela Corner   Wed Oct 22, 2022 11:55 AM) 10/22/22: Reports during TOC call has not used lately  polyethylene glycol powder (GLYCOLAX/MIRALAX) 17 GM/SCOOP powder [id number] Yes Take 17 g by mouth at bedtime. Lurene Shadow, MD Taking Active            Med Note Otelia Limes Oct 22, 2022 11:48 AM) 10/22/22: Reports during TOC has not been taking  post- recent hospital discharge on 10/21/22  PREVIDENT 5000 BOOSTER PLUS 1.1 % PSTE [id number] Yes Place 1 application onto teeth 2 (two) times daily. [provider] Taking Active Spouse/Significant Other  rivaroxaban (XARELTO) 20 MG TABS tablet [id number] Yes Take 1 tablet (20 mg total) by mouth daily. Olevia Perches P, DO Taking Active Spouse/Significant Other  rOPINIRole (REQUIP) 3 MG tablet [id number] Yes TAKE 1 TABLET BY MOUTH 3 TIMES A DAY Tat, Octaviano Batty, DO Taking Active   simvastatin (ZOCOR) 40 MG tablet [id number] Yes Take 1 tablet (40 mg total) by mouth daily. Olevia Perches P, DO Taking Active Spouse/Significant Other  tamsulosin (FLOMAX) 0.4 MG CAPS capsule [id number] Yes Take 2 capsules (0.8 mg total) by mouth daily. Olevia Perches P, DO Taking Active Spouse/Significant Other  traZODone (DESYREL) 50 MG tablet [id number] Yes Take 1 tablet (50 mg total) by mouth at bedtime. Dorcas Carrow, DO Taking Active Spouse/Significant Other           Home Care and Equipment/Supplies: Were Home Health Services Ordered?: Yes Name of Home Health Agency:: RN- PT- [facility]: [phone]: confirmed with Marylene Land at [facility] that [place] office covers [place] area; she will call me back  to confirm status of referral Has Agency set up a time to come to your home?: No 12: 57 pm:  Received call back confirmation from [facility] [place] that referral for RN, PT, OT has been received and is in progress- they will call to schedule with patient by tomorrow-- contacted patient's spouse and updated her accordingly EMR reviewed for Home Health Orders: Orders present/patient has not received call (refer to CM for follow-up) Any new equipment or medical supplies ordered?: Yes [facility]) Name of Medical supply agency?: [employer] Were you able to get the equipment/medical supplies?: Yes (reports patient not having to use [facility] much post-hospital discharge: reports he has been able to ambulate to bathroom without  difficulty) Do you have any questions related to the use of the equipment/supplies?: No  Functional Questionnaire: Do you need assistance with bathing/showering or dressing?: No (wife assists/ supervises as indicated) Do you need assistance with meal preparation?: Yes (wife assists as indicated) Do you need assistance with eating?: No Do you have difficulty maintaining continence: No Do you need assistance with getting out of bed/getting out of a chair/moving?: No Do you have difficulty managing or taking your medications?: Yes (wife assists as indicated)  Follow up appointments reviewed: PCP Follow-up appointment confirmed?: Yes (care coordination outreach in real-time with scheduling care guide to successfully schedule hospital follow up PCP appointment 10/27/22) Date of PCP follow-up appointment?: 10/27/22 Follow-up Provider: PCP covering provider- [place] Specialist Hospital Follow-up appointment confirmed?: Yes Date of Specialist follow-up appointment?: 10/29/22 Follow-Up Specialty Provider:: surgeon- Dr. Aleen Campi Do you need transportation to your follow-up appointment?: No Do you understand care options if your condition(s) worsen?: Yes-patient verbalized understanding  SDOH Interventions Today    Flowsheet Row Most Recent Value  SDOH Interventions   Food Insecurity Interventions Intervention Not Indicated  Transportation Interventions Intervention Not Indicated  [spouse providing transportation  post-recent surgery]      TOC Interventions Today    Flowsheet Row Most Recent Value  TOC Interventions   TOC Interventions Discussed/Reviewed TOC Interventions Discussed, Arranged PCP follow up within 7 days/Care Guide scheduled, Contacted Home Health RN/OT/PT, Post op wound/incision care, S/S of infection  [provided my direct contact information should questions/ concerns/ needs arise post-TOC call, prior to RN CM telephone visit 11/03/22]      Interventions Today    Flowsheet Row  Most Recent Value  Chronic Disease   Chronic disease during today's visit Other  [sigmoid volvulus with surgical resection]  General Interventions   General Interventions Discussed/Reviewed General Interventions Discussed, Durable Medical Equipment (DME), Communication with, Referral to Nurse  Durable Medical Equipment (DME) Bed side commode, [place] [facility] uses DME only prn- wife reports has "not needed much" post-recent hospital discharge]  Communication with RN  [scheduled with RN CM Care Coordinator for follow up outreach on 11/03/22]  Exercise Interventions   Exercise Discussed/Reviewed Exercise Discussed  [role of home health services with importance of participation/ ongoing engagement]  Education Interventions   Education Provided Provided Education  Provided Verbal Education On When to see the doctor, Other  [ongoing need to continue monitoring BP's at home as per baseline,  reviewed recent blood pressures and current action plan for any low blood pressure values]  Nutrition Interventions   Nutrition Discussed/Reviewed Nutrition Discussed  Pharmacy Interventions   Pharmacy Dicussed/Reviewed Pharmacy Topics Discussed  [Full and EXTENSIVE medication review with updating medication list in EHR per patient's spouse report: 5 pages on AVS]  Safety Interventions   Safety Discussed/Reviewed Safety Discussed      Caryl Pina, RN, BSN, CCRN Alumnus RN CM Care Coordination/ Transition of Care- [facility] Care Management [phone]: direct office

## 2022-10-25 DIAGNOSIS — I10 Essential (primary) hypertension: Secondary | ICD-10-CM | POA: Diagnosis not present

## 2022-10-25 DIAGNOSIS — G20A1 Parkinson's disease without dyskinesia, without mention of fluctuations: Secondary | ICD-10-CM | POA: Diagnosis not present

## 2022-10-25 DIAGNOSIS — F32A Depression, unspecified: Secondary | ICD-10-CM | POA: Diagnosis not present

## 2022-10-25 DIAGNOSIS — G3184 Mild cognitive impairment, so stated: Secondary | ICD-10-CM | POA: Diagnosis not present

## 2022-10-25 DIAGNOSIS — E876 Hypokalemia: Secondary | ICD-10-CM | POA: Diagnosis not present

## 2022-10-25 DIAGNOSIS — D6851 Activated protein C resistance: Secondary | ICD-10-CM | POA: Diagnosis not present

## 2022-10-25 DIAGNOSIS — I4891 Unspecified atrial fibrillation: Secondary | ICD-10-CM | POA: Diagnosis not present

## 2022-10-25 DIAGNOSIS — Z48815 Encounter for surgical aftercare following surgery on the digestive system: Secondary | ICD-10-CM | POA: Diagnosis not present

## 2022-10-25 DIAGNOSIS — M1990 Unspecified osteoarthritis, unspecified site: Secondary | ICD-10-CM | POA: Diagnosis not present

## 2022-10-27 ENCOUNTER — Telehealth: Payer: Self-pay | Admitting: Family Medicine

## 2022-10-27 ENCOUNTER — Ambulatory Visit (INDEPENDENT_AMBULATORY_CARE_PROVIDER_SITE_OTHER): Payer: Medicare HMO | Admitting: Physician Assistant

## 2022-10-27 ENCOUNTER — Ambulatory Visit: Payer: Self-pay

## 2022-10-27 ENCOUNTER — Encounter: Payer: Self-pay | Admitting: Physician Assistant

## 2022-10-27 VITALS — BP 126/75 | HR 65 | Temp 97.8°F | Ht 69.0 in | Wt 220.6 lb

## 2022-10-27 DIAGNOSIS — K562 Volvulus: Secondary | ICD-10-CM | POA: Diagnosis not present

## 2022-10-27 DIAGNOSIS — Z09 Encounter for follow-up examination after completed treatment for conditions other than malignant neoplasm: Secondary | ICD-10-CM

## 2022-10-27 DIAGNOSIS — E876 Hypokalemia: Secondary | ICD-10-CM | POA: Diagnosis not present

## 2022-10-27 DIAGNOSIS — M1990 Unspecified osteoarthritis, unspecified site: Secondary | ICD-10-CM | POA: Diagnosis not present

## 2022-10-27 DIAGNOSIS — F32A Depression, unspecified: Secondary | ICD-10-CM | POA: Diagnosis not present

## 2022-10-27 DIAGNOSIS — D6851 Activated protein C resistance: Secondary | ICD-10-CM | POA: Diagnosis not present

## 2022-10-27 DIAGNOSIS — G3184 Mild cognitive impairment, so stated: Secondary | ICD-10-CM | POA: Diagnosis not present

## 2022-10-27 DIAGNOSIS — Z48815 Encounter for surgical aftercare following surgery on the digestive system: Secondary | ICD-10-CM | POA: Diagnosis not present

## 2022-10-27 DIAGNOSIS — I4891 Unspecified atrial fibrillation: Secondary | ICD-10-CM | POA: Diagnosis not present

## 2022-10-27 DIAGNOSIS — I10 Essential (primary) hypertension: Secondary | ICD-10-CM | POA: Diagnosis not present

## 2022-10-27 DIAGNOSIS — G20A1 Parkinson's disease without dyskinesia, without mention of fluctuations: Secondary | ICD-10-CM | POA: Diagnosis not present

## 2022-10-27 NOTE — Telephone Encounter (Signed)
Lawson Fiscal, nurse with [facility], called for when she had her start of care on Saturday she had to report patient had Level 1 Medication interaction with amLODipine (NORVASC) 2.5 MG tablet + simvastatin (ZOCOR) 40 MG tablet.      Chief Complaint: Lawson Fiscal with [facility] wants PCP aware of level 1 medication interaction with amlodipine and simvastatin. Symptoms: n/a Frequency: n/a Pertinent Negatives: Patient denies n/a Disposition: [] ED /[] Urgent Care (no appt availability in office) / [] Appointment(In office/virtual)/ []  [place] [facility]/ [] Home Care/ [] Refused Recommended Disposition /[] [place] [facility]/ [x]  Follow-up with PCP Additional Notes: FYI  Reason for Disposition  [1] Caller has NON-URGENT medicine question about med that PCP prescribed AND [2] triager unable to answer question  Answer Assessment - Initial Assessment Questions 1. NAME of MEDICINE: "What medicine(s) are you calling about?"     Amlodipine and simvastatin  2. QUESTION: "What is your question?" (e.g., double dose of medicine, side effect)     Level 1 medication interaction 3. PRESCRIBER: "Who prescribed the medicine?" Reason: if prescribed by specialist, call should be referred to that group.     Johnson 4. SYMPTOMS: "Do you have any symptoms?" If Yes, ask: "What symptoms are you having?"  "How bad are the symptoms (e.g., mild, moderate, severe)     N/a 5. PREGNANCY:  "Is there any chance that you are pregnant?" "When was your last menstrual period?"     N/a  Protocols used: Medication Question Call-A-AH

## 2022-10-27 NOTE — Telephone Encounter (Signed)
 OK for verbal orders?

## 2022-10-27 NOTE — Telephone Encounter (Signed)
Lori called & was requesting Verbal Orders to continue nursing home care for patient with [facility]---- 1 week 6   Lori's contact # [phone]

## 2022-10-27 NOTE — Telephone Encounter (Signed)
Called [facility] and gave verbal orders per provider.

## 2022-10-27 NOTE — Telephone Encounter (Signed)
Please see pt chart.

## 2022-10-27 NOTE — Progress Notes (Unsigned)
Acute Office Visit   Patient: Thomas [name]   DOB: Sep 05, 1950   72 y.o. Male  MRN: [id number] Visit Date: 10/27/2022  Today's healthcare provider: Oswaldo Conroy Noorah Giammona, PA-C  Introduced myself to the patient as a Secondary school teacher and provided education on APPs in clinical practice.    Chief Complaint  Patient presents with   Hospitalization Follow-up    Patient wife says they have noticed some oozing from the wound and would like for provider to take a look at the area at today's visit.    Subjective    HPI HPI     Hospitalization Follow-up    Additional comments: Patient wife says they have noticed some oozing from the wound and would like for provider to take a look at the area at today's visit.       Last edited by Malen Gauze, CMA on 10/27/2022  2:20 PM.        He is here with his wife  They are concerned for some "oozing" from his surgical site - reports the drainage is mostly red, denies purulent appearing drainage He denies pain or fevers  They are unsure of how much stool he should be making  He reports he going regularly every morning with sometimes a second bowel movement later in the day  He is eating small amounts but denies issues tolerating PO Intake, He states he does have some altered taste since coming out of the hospital   They are concerned for his medications and organizing them  His wife states they are having issues since he has to take several different medications and has multiple administration times which can be a bit exhaustive Discussed sending to pharmacies with pill pack capabilities- provided her with [facility] as potential resources for this process and cost.   Frances Furbish has come the house  PT has also come by OT is scheduled to come to the house tomorrow    Transition of Care Hospital Follow up.   Hospital/Facility: [facility] D/C Physician: Lurene Shadow, MD  D/C Date: 10/21/22  Records Requested:  Records Received:  Records  Reviewed:   Diagnoses on Discharge:  Principal Problem:   Sigmoid volvulus (HCC) Active Problems:   DVT (deep venous thrombosis) (HCC)   Atrial fibrillation (HCC)   Hypertension   Parkinson's disease  Date of interactive Contact within 48 hours of discharge: 10/22/22 Contact was through: phone  Date of 7 day or 14 day face-to-face visit:    within 7 days  Outpatient Encounter Medications as of 10/27/2022  Medication Sig Note   acetaminophen (TYLENOL) 500 MG tablet Take 1,000 mg by mouth daily.    amantadine (SYMMETREL) 100 MG capsule TAKE ONE CAPSULE BY MOUTH THREE TIMES A DAY (Patient taking differently: Take 100 mg by mouth 3 (three) times daily.) 10/15/2022: Patient's doses at 0600, 1400, 2200   amLODipine (NORVASC) 2.5 MG tablet TAKE 1 TABLET (2.5 MG TOTAL) BY MOUTH DAILY AS NEEDED (BLOOD PRESSURE GREATER THAN 160/90). TAKE IN THE MORNING (Patient taking differently: Take 2.5 mg by mouth at bedtime. TAKE 1 TABLET (2.5 MG TOTAL) BY MOUTH DAILY AS NEEDED (BLOOD PRESSURE GREATER THAN 160/90). TAKE IN THE MORNING)    calcium carbonate (TUMS - DOSED IN MG ELEMENTAL CALCIUM) 500 MG chewable tablet Chew 1 tablet by mouth daily as needed for indigestion or heartburn.    carbidopa-levodopa (SINEMET CR) 50-200 MG tablet TAKE 1 TABLET BY MOUTH AT BEDTIME  carbidopa-levodopa (SINEMET IR) 25-100 MG tablet TAKE TWO TABLETS BY MOUTH FOUR TIMES A DAY AT 6AM, 10AM, 2PM. AND 6PM. (Patient taking differently: 2 tablets 4 (four) times daily. TAKE TWO TABLETS BY MOUTH FOUR TIMES A DAY AT 6AM, 10AM, 2PM. AND 6PM.)    cetirizine (ZYRTEC) 10 MG tablet Take 10 mg by mouth at bedtime.    cholecalciferol (VITAMIN D3) 25 MCG (1000 UNIT) tablet Take 1,000 Units by mouth daily.    diclofenac Sodium (VOLTAREN) 1 % GEL APPLY TOPICALLY 4 GRAMS FOUR TIMES DAILY (Patient taking differently: Apply 4 g topically 4 (four) times daily as needed (pain).)    hydrALAZINE (APRESOLINE) 25 MG tablet Take 1 tablet (25 mg total) by  mouth 3 (three) times daily as needed (for SBP >160). Takes in the evening 10/22/2022: 10/22/22: reports during [facility] call has not needed post- hospital discharge on 10/21/22; monitoring BP's at home   HYDROcodone-acetaminophen (NORCO/VICODIN) 5-325 MG tablet Take 1 tablet by mouth 2 (two) times daily.    Levodopa (INBRIJA) 42 MG CAPS Samples of this drug were given to the patient, quantity 1 box of 32 capsules, Lot Number [id number] Exp: 03/2023  Patient has been educated on dosage and uses of medication.    Levodopa (INBRIJA) 42 MG CAPS Samples of this drug were given to the patient, quantity 1, Lot Number [id number] Exp 12-24    lisinopril (ZESTRIL) 30 MG tablet Take 1 tablet (30 mg total) by mouth daily.    melatonin 5 MG TABS Take 5 mg by mouth at bedtime.    methocarbamol (ROBAXIN) 500 MG tablet Take 1 tablet (500 mg total) by mouth every 6 (six) hours as needed for muscle spasms. 10/15/2022: Per Spouse, Patient takes 1000 mg twice daily at 0600, 2200   metoprolol succinate (TOPROL-XL) 25 MG 24 hr tablet Take 1 tablet (25 mg total) by mouth at bedtime.    Multiple Vitamin (MULTIVITAMIN) tablet Take 1 tablet by mouth daily at 6 PM.    oxybutynin (DITROPAN-XL) 10 MG 24 hr tablet Take 1 tablet (10 mg total) by mouth daily.    Polyethyl Glycol-Propyl Glycol (SYSTANE ULTRA) 0.4-0.3 % SOLN Place 1 drop into both eyes daily as needed (DRY EYES). 10/22/2022: 10/22/22: Reports during TOC call has not used lately   polyethylene glycol powder (GLYCOLAX/MIRALAX) 17 GM/SCOOP powder Take 17 g by mouth at bedtime. 10/22/2022: 10/22/22: Reports during TOC has not been taking post- recent hospital discharge on 10/21/22   PREVIDENT 5000 BOOSTER PLUS 1.1 % PSTE Place 1 application onto teeth 2 (two) times daily.    rivaroxaban (XARELTO) 20 MG TABS tablet Take 1 tablet (20 mg total) by mouth daily.    rOPINIRole (REQUIP) 3 MG tablet TAKE 1 TABLET BY MOUTH 3 TIMES A DAY    simvastatin (ZOCOR) 40 MG tablet Take 1 tablet (40 mg  total) by mouth daily.    tamsulosin (FLOMAX) 0.4 MG CAPS capsule Take 2 capsules (0.8 mg total) by mouth daily.    traZODone (DESYREL) 50 MG tablet Take 1 tablet (50 mg total) by mouth at bedtime.    [DISCONTINUED] ondansetron (ZOFRAN) 4 MG tablet Take 1 tablet (4 mg total) by mouth every 8 (eight) hours as needed for nausea or vomiting.    No facility-administered encounter medications on file as of 10/27/2022.    Diagnostic Tests Reviewed/Disposition:   Consults: general surgery   Discharge Instructions:   Disease/illness Education:  Home Health/Community Services Discussions/Referrals: home therapy recommended - verbal orders provided by PCP today  Establishment or re-establishment of referral orders for community resources:  Discussion with other health care providers: none   Assessment and Support of treatment regimen adherence:  Appointments Coordinated with: reviewed upcoming apt with general surgery   Education for self-management, independent living, and ADLs: Reviewed importance of using nursing home care from [facility] services     Medications: Outpatient Medications Prior to Visit  Medication Sig   acetaminophen (TYLENOL) 500 MG tablet Take 1,000 mg by mouth daily.   amantadine (SYMMETREL) 100 MG capsule TAKE ONE CAPSULE BY MOUTH THREE TIMES A DAY (Patient taking differently: Take 100 mg by mouth 3 (three) times daily.)   amLODipine (NORVASC) 2.5 MG tablet TAKE 1 TABLET (2.5 MG TOTAL) BY MOUTH DAILY AS NEEDED (BLOOD PRESSURE GREATER THAN 160/90). TAKE IN THE MORNING (Patient taking differently: Take 2.5 mg by mouth at bedtime. TAKE 1 TABLET (2.5 MG TOTAL) BY MOUTH DAILY AS NEEDED (BLOOD PRESSURE GREATER THAN 160/90). TAKE IN THE MORNING)   calcium carbonate (TUMS - DOSED IN MG ELEMENTAL CALCIUM) 500 MG chewable tablet Chew 1 tablet by mouth daily as needed for indigestion or heartburn.   carbidopa-levodopa (SINEMET CR) 50-200 MG tablet TAKE 1 TABLET BY MOUTH  AT BEDTIME   carbidopa-levodopa (SINEMET IR) 25-100 MG tablet TAKE TWO TABLETS BY MOUTH FOUR TIMES A DAY AT 6AM, 10AM, 2PM. AND 6PM. (Patient taking differently: 2 tablets 4 (four) times daily. TAKE TWO TABLETS BY MOUTH FOUR TIMES A DAY AT 6AM, 10AM, 2PM. AND 6PM.)   cetirizine (ZYRTEC) 10 MG tablet Take 10 mg by mouth at bedtime.   cholecalciferol (VITAMIN D3) 25 MCG (1000 UNIT) tablet Take 1,000 Units by mouth daily.   diclofenac Sodium (VOLTAREN) 1 % GEL APPLY TOPICALLY 4 GRAMS FOUR TIMES DAILY (Patient taking differently: Apply 4 g topically 4 (four) times daily as needed (pain).)   hydrALAZINE (APRESOLINE) 25 MG tablet Take 1 tablet (25 mg total) by mouth 3 (three) times daily as needed (for SBP >160). Takes in the evening   HYDROcodone-acetaminophen (NORCO/VICODIN) 5-325 MG tablet Take 1 tablet by mouth 2 (two) times daily.   Levodopa (INBRIJA) 42 MG CAPS Samples of this drug were given to the patient, quantity 1 box of 32 capsules, Lot Number [id number] Exp: 03/2023  Patient has been educated on dosage and uses of medication.   Levodopa (INBRIJA) 42 MG CAPS Samples of this drug were given to the patient, quantity 1, Lot Number [id number] Exp 12-24   lisinopril (ZESTRIL) 30 MG tablet Take 1 tablet (30 mg total) by mouth daily.   melatonin 5 MG TABS Take 5 mg by mouth at bedtime.   methocarbamol (ROBAXIN) 500 MG tablet Take 1 tablet (500 mg total) by mouth every 6 (six) hours as needed for muscle spasms.   metoprolol succinate (TOPROL-XL) 25 MG 24 hr tablet Take 1 tablet (25 mg total) by mouth at bedtime.   Multiple Vitamin (MULTIVITAMIN) tablet Take 1 tablet by mouth daily at 6 PM.   oxybutynin (DITROPAN-XL) 10 MG 24 hr tablet Take 1 tablet (10 mg total) by mouth daily.   Polyethyl Glycol-Propyl Glycol (SYSTANE ULTRA) 0.4-0.3 % SOLN Place 1 drop into both eyes daily as needed (DRY EYES).   polyethylene glycol powder (GLYCOLAX/MIRALAX) 17 GM/SCOOP powder Take 17 g by mouth at bedtime.    PREVIDENT 5000 BOOSTER PLUS 1.1 % PSTE Place 1 application onto teeth 2 (two) times daily.   rivaroxaban (XARELTO) 20 MG TABS tablet Take 1 tablet (20 mg total) by mouth  daily.   rOPINIRole (REQUIP) 3 MG tablet TAKE 1 TABLET BY MOUTH 3 TIMES A DAY   simvastatin (ZOCOR) 40 MG tablet Take 1 tablet (40 mg total) by mouth daily.   tamsulosin (FLOMAX) 0.4 MG CAPS capsule Take 2 capsules (0.8 mg total) by mouth daily.   traZODone (DESYREL) 50 MG tablet Take 1 tablet (50 mg total) by mouth at bedtime.   [DISCONTINUED] ondansetron (ZOFRAN) 4 MG tablet Take 1 tablet (4 mg total) by mouth every 8 (eight) hours as needed for nausea or vomiting.   No facility-administered medications prior to visit.    Review of Systems  Constitutional:  Negative for chills, fatigue and fever.  Respiratory:  Negative for shortness of breath and wheezing.   Gastrointestinal:  Negative for abdominal pain, blood in stool, constipation, diarrhea, nausea and vomiting.  Neurological:  Negative for dizziness, light-headedness and headaches.         Objective    BP 126/75   Pulse 65   Temp 97.8 F (36.6 C) (Oral)   Ht 5\' 9"  (1.753 m)   Wt 220 lb 9.6 oz (100.1 kg)   SpO2 98%   BMI 32.58 kg/m      Physical Exam Vitals reviewed.  Constitutional:      General: He is awake.     Appearance: Normal appearance. He is well-developed and well-groomed.  HENT:     Head: Normocephalic and atraumatic.  Cardiovascular:     Rate and Rhythm: Normal rate and regular rhythm.     Pulses: Normal pulses.          Radial pulses are 2+ on the right side and 2+ on the left side.     Heart sounds: Normal heart sounds.  Pulmonary:     Effort: Pulmonary effort is normal.     Breath sounds: No decreased air movement. No decreased breath sounds, wheezing, rhonchi or rales.  Abdominal:     Comments: Surgical site with staples - mild dried blood and some bloody drainage- no apparent purulent drainage or crusting, no swelling,  increased warmth or erythema noted   Musculoskeletal:     Right lower leg: No edema.     Left lower leg: No edema.  Neurological:     Mental Status: He is alert.  Psychiatric:        Behavior: Behavior is cooperative.       No results found for any visits on 10/27/22.  Assessment & Plan      No follow-ups on file.        Problem List Items Addressed This Visit       Digestive   Sigmoid volvulus [facility])   Other Visit Diagnoses     Hospital discharge follow-up    -  Primary Patient was dc from the hospital on 10/21/22 for issues with volvulus which was addressed with surgical intervention I reviewed the hospital dc note, imaging, lab results, and surgical notes for this visit and reviewed DC instructions and management plan with the patient today Inspected surgical site today during apt - appears well healing at this time - reviewed importance of keeping upcoming follow up with General Surgery  Continue with current management plan Reviewed ED and return precautions  Follow up as needed for persistent or progressing symptoms          No follow-ups on file.   I, Gladys Gutman E Solara Goodchild, PA-C, have reviewed all documentation for this visit. The documentation on 10/29/22 for the exam, diagnosis, procedures, and  orders are all accurate and complete.   Jacquelin Hawking, MHS, PA-C [facility]

## 2022-10-27 NOTE — Telephone Encounter (Signed)
Noted. I am not concerned and they should be continued

## 2022-10-28 DIAGNOSIS — F32A Depression, unspecified: Secondary | ICD-10-CM | POA: Diagnosis not present

## 2022-10-28 DIAGNOSIS — D6851 Activated protein C resistance: Secondary | ICD-10-CM | POA: Diagnosis not present

## 2022-10-28 DIAGNOSIS — G3184 Mild cognitive impairment, so stated: Secondary | ICD-10-CM | POA: Diagnosis not present

## 2022-10-28 DIAGNOSIS — G20A1 Parkinson's disease without dyskinesia, without mention of fluctuations: Secondary | ICD-10-CM | POA: Diagnosis not present

## 2022-10-28 DIAGNOSIS — E876 Hypokalemia: Secondary | ICD-10-CM | POA: Diagnosis not present

## 2022-10-28 DIAGNOSIS — I10 Essential (primary) hypertension: Secondary | ICD-10-CM | POA: Diagnosis not present

## 2022-10-28 DIAGNOSIS — I4891 Unspecified atrial fibrillation: Secondary | ICD-10-CM | POA: Diagnosis not present

## 2022-10-28 DIAGNOSIS — M1990 Unspecified osteoarthritis, unspecified site: Secondary | ICD-10-CM | POA: Diagnosis not present

## 2022-10-28 DIAGNOSIS — Z48815 Encounter for surgical aftercare following surgery on the digestive system: Secondary | ICD-10-CM | POA: Diagnosis not present

## 2022-10-29 ENCOUNTER — Encounter: Payer: Self-pay | Admitting: Surgery

## 2022-10-29 ENCOUNTER — Ambulatory Visit: Payer: Medicare HMO | Admitting: Surgery

## 2022-10-29 VITALS — BP 133/76 | HR 57 | Temp 98.0°F | Ht 69.0 in | Wt 220.0 lb

## 2022-10-29 DIAGNOSIS — K562 Volvulus: Secondary | ICD-10-CM

## 2022-10-29 DIAGNOSIS — Z09 Encounter for follow-up examination after completed treatment for conditions other than malignant neoplasm: Secondary | ICD-10-CM

## 2022-10-29 DIAGNOSIS — T8149XA Infection following a procedure, other surgical site, initial encounter: Secondary | ICD-10-CM

## 2022-10-29 MED ORDER — AMOXICILLIN-POT CLAVULANATE 875-125 MG PO TABS: 1.0000 | ORAL_TABLET | Freq: Two times a day (BID) | ORAL | 0 refills | Status: DC

## 2022-10-29 NOTE — Patient Instructions (Addendum)
We have sent in a prescription for antibiotics to your pharmacy. Please finish all of this prescription. We have placed steri strips, they will start to come off on their own in 1-2 weeks. Keep a dressing over the areas that are draining and change this once a day or more often as needed. Follow up next Friday.  Increase your fiber intake as well as your fluid intake.  You could use Biotene for dry mouth.  GENERAL POST-OPERATIVE PATIENT INSTRUCTIONS   WOUND CARE INSTRUCTIONS: Try to keep the wound dry and avoid ointments on the wound unless directed to do so.  If the wound becomes bright red and painful or starts to drain infected material that is not clear, please contact your physician immediately.  If the wound is mildly pink and has a thick firm ridge underneath it, this is normal, and is referred to as a healing ridge.  This will resolve over the next 4-6 weeks.  BATHING: You may shower if you have been informed of this by your surgeon. However, Please do not submerge in a tub, hot tub, or pool until incisions are completely sealed or have been told by your surgeon that you may do so.  DIET:  You may eat any foods that you can tolerate.  It is a good idea to eat a high fiber diet and take in plenty of fluids to prevent constipation.  If you do become constipated you may want to take a mild laxative or take ducolax tablets on a daily basis until your bowel habits are regular.  Constipation can be very uncomfortable, along with straining, after recent surgery.  ACTIVITY: You may want to hug a pillow when coughing and sneezing to add additional support to the surgical area, if you had abdominal or chest surgery, which will decrease pain during these times.  You are encouraged to walk and engage in light activity for the next two weeks.  You should not lift more than 20 pounds for 6 weeks total after surgery as it could put you at increased risk for complications.  Twenty pounds is roughly equivalent  to a plastic bag of groceries. At that time- Listen to your body when lifting, if you have pain when lifting, stop and then try again in a few days. Soreness after doing exercises or activities of daily living is normal as you get back in to your normal routine.  MEDICATIONS:  Try to take narcotic medications and anti-inflammatory medications, such as tylenol, ibuprofen, naprosyn, etc., with food.  This will minimize stomach upset from the medication.  Should you develop nausea and vomiting from the pain medication, or develop a rash, please discontinue the medication and contact your physician.  You should not drive, make important decisions, or operate machinery when taking narcotic pain medication.  SUNBLOCK Use sun block to incision area over the next year if this area will be exposed to sun. This helps decrease scarring and will allow you avoid a permanent darkened area over your incision.  QUESTIONS:  Please feel free to call our office if you have any questions, and we will be glad to assist you. [phone]

## 2022-10-29 NOTE — Progress Notes (Signed)
10/29/2022  HPI: Eshawn Coor is a 72 y.o. male s/p exploratory laparotomy with sigmoidectomy for sigmoid volvulus on 10/17/2022.  Patient presents today for postoperative follow-up.  He reports that he feels much better and denies any significant pain at the midline incision.  He reports his mobility is improving.  He is having bowel movements denies any constipation at this point.  His wife reports that there is been some drainage from the midline incision but has not been purulent.  Vital signs: BP 133/76   Pulse (!) 57   Temp 98 F (36.7 C)   Ht 5\' 9"  (1.753 m)   Wt 220 lb (99.8 kg)   SpO2 98%   BMI 32.49 kg/m    Physical Exam: Constitutional: No acute distress Abdomen: Soft, nondistended, appropriately sore to palpation.  Midline incision has an area of erythema at the inferior half which is a little bit blanching but without fluctuance.  The skin in this area is well sealed between staples.  The umbilical portion of the incision has some dehiscence which is draining serosanguineous fluid.  All staples were removed and overall there is 2 small open areas which are located at the umbilical portion of the incision and about 2 cm inferior to it.  However no purulence is seen.  Steri-Strips were applied over the rest of the incision and dry gauze dressing applied as well.  Assessment/Plan: This is a 72 y.o. male s/p sigmoidectomy for sigmoid volvulus.  - The patient may be trying to get a low-grade infection at the midline incision.  The portion where the erythema is located is well sealed and unable to open between the staples or after removing the staples.  As a precaution, we will start the patient on Augmentin.  Discussed with the patient and his wife about doing daily dressing changes and as needed to keep the area clean and dry.  If the drainage starts becoming purulent or the incision is becoming more tender, then he needs to see me sooner to reevaluate the wound.  Otherwise he can  follow-up with me in 10 days to reassess. -Patient may shower but do not submerge the wound.  Discussed with him that he can start his regular diet from home but he should increase his fiber intake.  Still continues having activity restrictions of no heavy lifting or pushing of no more than 15 pounds for total of 4 weeks from surgery.   Howie Ill, MD [facility]

## 2022-10-30 DIAGNOSIS — D6851 Activated protein C resistance: Secondary | ICD-10-CM | POA: Diagnosis not present

## 2022-10-30 DIAGNOSIS — M1990 Unspecified osteoarthritis, unspecified site: Secondary | ICD-10-CM | POA: Diagnosis not present

## 2022-10-30 DIAGNOSIS — G20A1 Parkinson's disease without dyskinesia, without mention of fluctuations: Secondary | ICD-10-CM | POA: Diagnosis not present

## 2022-10-30 DIAGNOSIS — F32A Depression, unspecified: Secondary | ICD-10-CM | POA: Diagnosis not present

## 2022-10-30 DIAGNOSIS — I4891 Unspecified atrial fibrillation: Secondary | ICD-10-CM | POA: Diagnosis not present

## 2022-10-30 DIAGNOSIS — G3184 Mild cognitive impairment, so stated: Secondary | ICD-10-CM | POA: Diagnosis not present

## 2022-10-30 DIAGNOSIS — I10 Essential (primary) hypertension: Secondary | ICD-10-CM | POA: Diagnosis not present

## 2022-10-30 DIAGNOSIS — Z48815 Encounter for surgical aftercare following surgery on the digestive system: Secondary | ICD-10-CM | POA: Diagnosis not present

## 2022-10-30 DIAGNOSIS — E876 Hypokalemia: Secondary | ICD-10-CM | POA: Diagnosis not present

## 2022-11-01 DIAGNOSIS — D6851 Activated protein C resistance: Secondary | ICD-10-CM | POA: Diagnosis not present

## 2022-11-01 DIAGNOSIS — G3184 Mild cognitive impairment, so stated: Secondary | ICD-10-CM | POA: Diagnosis not present

## 2022-11-01 DIAGNOSIS — E876 Hypokalemia: Secondary | ICD-10-CM | POA: Diagnosis not present

## 2022-11-01 DIAGNOSIS — M1990 Unspecified osteoarthritis, unspecified site: Secondary | ICD-10-CM | POA: Diagnosis not present

## 2022-11-01 DIAGNOSIS — F32A Depression, unspecified: Secondary | ICD-10-CM | POA: Diagnosis not present

## 2022-11-01 DIAGNOSIS — Z48815 Encounter for surgical aftercare following surgery on the digestive system: Secondary | ICD-10-CM | POA: Diagnosis not present

## 2022-11-01 DIAGNOSIS — I10 Essential (primary) hypertension: Secondary | ICD-10-CM | POA: Diagnosis not present

## 2022-11-01 DIAGNOSIS — G20A1 Parkinson's disease without dyskinesia, without mention of fluctuations: Secondary | ICD-10-CM | POA: Diagnosis not present

## 2022-11-01 DIAGNOSIS — I4891 Unspecified atrial fibrillation: Secondary | ICD-10-CM | POA: Diagnosis not present

## 2022-11-03 ENCOUNTER — Encounter: Payer: Self-pay | Admitting: *Deleted

## 2022-11-03 ENCOUNTER — Ambulatory Visit: Payer: Self-pay | Admitting: *Deleted

## 2022-11-03 DIAGNOSIS — G20A1 Parkinson's disease without dyskinesia, without mention of fluctuations: Secondary | ICD-10-CM | POA: Diagnosis not present

## 2022-11-03 DIAGNOSIS — M1990 Unspecified osteoarthritis, unspecified site: Secondary | ICD-10-CM | POA: Diagnosis not present

## 2022-11-03 DIAGNOSIS — G3184 Mild cognitive impairment, so stated: Secondary | ICD-10-CM | POA: Diagnosis not present

## 2022-11-03 DIAGNOSIS — I10 Essential (primary) hypertension: Secondary | ICD-10-CM | POA: Diagnosis not present

## 2022-11-03 DIAGNOSIS — F32A Depression, unspecified: Secondary | ICD-10-CM | POA: Diagnosis not present

## 2022-11-03 DIAGNOSIS — D6851 Activated protein C resistance: Secondary | ICD-10-CM | POA: Diagnosis not present

## 2022-11-03 DIAGNOSIS — Z48815 Encounter for surgical aftercare following surgery on the digestive system: Secondary | ICD-10-CM | POA: Diagnosis not present

## 2022-11-03 DIAGNOSIS — I4891 Unspecified atrial fibrillation: Secondary | ICD-10-CM | POA: Diagnosis not present

## 2022-11-03 DIAGNOSIS — E876 Hypokalemia: Secondary | ICD-10-CM | POA: Diagnosis not present

## 2022-11-03 NOTE — Patient Outreach (Signed)
  Care Coordination   Initial Visit Note   11/04/2022 Name: Thomas [name] MRN: [id number] DOB: 02-20-1951  Thomas [name] is a 72 y.o. year old male who sees Dorcas Carrow, DO for primary care. I spoke with  [name] and wife by phone today.  What matters to the patients health and wellness today?  Heal wound infection    Goals Addressed             This Visit's Progress    Recover from abdominal surgery       Interventions Today    Flowsheet Row Most Recent Value  Chronic Disease   Chronic disease during today's visit Other  General Interventions   General Interventions Discussed/Reviewed General Interventions Reviewed, Doctor Visits  Doctor Visits Discussed/Reviewed Doctor Visits Reviewed, PCP, Specialist  [Follow up again with surgeon on 7/26.  Has home health involved for OT/PT/RN.  OT signed off, PT will see today and RN tomorrow]  PCP/Specialist Visits Compliance with follow-up visit  [Seen by PCP on 7/15 and surgeon on 7/17]  Education Interventions   Education Provided Provided Education  Provided Verbal Education On Medication, When to see the doctor, Other  [Wound showing signs of infection, taking amoxicillin for 10 days.  Wife doing dressing changes daily and monitoring drainage.  Also taking Miralax for constipation. Denies any fever]              SDOH assessments and interventions completed:  Yes  SDOH Interventions Today    Flowsheet Row Most Recent Value  SDOH Interventions   Housing Interventions Intervention Not Indicated  Utilities Interventions Intervention Not Indicated        Care Coordination Interventions:  Yes, provided   Follow up plan: Follow up call scheduled for 8/5    Encounter Outcome:  Pt. Visit Completed   Kemper Durie, RN, MSN, [facility] Care Management Care Management Coordinator [phone]

## 2022-11-06 DIAGNOSIS — Z48815 Encounter for surgical aftercare following surgery on the digestive system: Secondary | ICD-10-CM | POA: Diagnosis not present

## 2022-11-06 DIAGNOSIS — E876 Hypokalemia: Secondary | ICD-10-CM | POA: Diagnosis not present

## 2022-11-06 DIAGNOSIS — F32A Depression, unspecified: Secondary | ICD-10-CM | POA: Diagnosis not present

## 2022-11-06 DIAGNOSIS — G3184 Mild cognitive impairment, so stated: Secondary | ICD-10-CM | POA: Diagnosis not present

## 2022-11-06 DIAGNOSIS — G20A1 Parkinson's disease without dyskinesia, without mention of fluctuations: Secondary | ICD-10-CM | POA: Diagnosis not present

## 2022-11-06 DIAGNOSIS — M1990 Unspecified osteoarthritis, unspecified site: Secondary | ICD-10-CM | POA: Diagnosis not present

## 2022-11-06 DIAGNOSIS — I4891 Unspecified atrial fibrillation: Secondary | ICD-10-CM | POA: Diagnosis not present

## 2022-11-06 DIAGNOSIS — I10 Essential (primary) hypertension: Secondary | ICD-10-CM | POA: Diagnosis not present

## 2022-11-06 DIAGNOSIS — D6851 Activated protein C resistance: Secondary | ICD-10-CM | POA: Diagnosis not present

## 2022-11-07 ENCOUNTER — Encounter: Payer: Self-pay | Admitting: Surgery

## 2022-11-07 ENCOUNTER — Ambulatory Visit (INDEPENDENT_AMBULATORY_CARE_PROVIDER_SITE_OTHER): Payer: Medicare HMO | Admitting: Surgery

## 2022-11-07 VITALS — BP 106/68 | HR 62 | Ht 69.5 in | Wt 214.2 lb

## 2022-11-07 DIAGNOSIS — T8149XA Infection following a procedure, other surgical site, initial encounter: Secondary | ICD-10-CM

## 2022-11-07 DIAGNOSIS — T8149XD Infection following a procedure, other surgical site, subsequent encounter: Secondary | ICD-10-CM | POA: Diagnosis not present

## 2022-11-07 DIAGNOSIS — Z09 Encounter for follow-up examination after completed treatment for conditions other than malignant neoplasm: Secondary | ICD-10-CM

## 2022-11-07 DIAGNOSIS — K562 Volvulus: Secondary | ICD-10-CM

## 2022-11-07 MED ORDER — AMOXICILLIN-POT CLAVULANATE 875-125 MG PO TABS: 1.0000 | ORAL_TABLET | Freq: Two times a day (BID) | ORAL | 0 refills | Status: AC

## 2022-11-07 NOTE — Patient Instructions (Signed)
If you have any concerns or questions, please feel free to call our office.    GENERAL POST-OPERATIVE PATIENT INSTRUCTIONS   WOUND CARE INSTRUCTIONS:  Keep a dry clean dressing on the wound if there is drainage. The initial bandage may be removed after 24 hours.  Once the wound has quit draining you may leave it open to air.  If clothing rubs against the wound or causes irritation and the wound is not draining you may cover it with a dry dressing during the daytime.  Try to keep the wound dry and avoid ointments on the wound unless directed to do so.  If the wound becomes bright red and painful or starts to drain infected material that is not clear, please contact your physician immediately.  If the wound is mildly pink and has a thick firm ridge underneath it, this is normal, and is referred to as a healing ridge.  This will resolve over the next 4-6 weeks.  BATHING: You may shower if you have been informed of this by your surgeon. However, Please do not submerge in a tub, hot tub, or pool until incisions are completely sealed or have been told by your surgeon that you may do so.  DIET:  You may eat any foods that you can tolerate.  It is a good idea to eat a high fiber diet and take in plenty of fluids to prevent constipation.  If you do become constipated you may want to take a mild laxative or take ducolax tablets on a daily basis until your bowel habits are regular.  Constipation can be very uncomfortable, along with straining, after recent surgery.  ACTIVITY:  You are encouraged to cough and deep breath or use your incentive spirometer if you were given one, every 15-30 minutes when awake.  This will help prevent respiratory complications and low grade fevers post-operatively if you had a general anesthetic.  You may want to hug a pillow when coughing and sneezing to add additional support to the surgical area, if you had abdominal or chest surgery, which will decrease pain during these times.   You are encouraged to walk and engage in light activity for the next two weeks.  You should not lift more than 20 pounds for 6 weeks total after surgery as it could put you at increased risk for complications.  Twenty pounds is roughly equivalent to a plastic bag of groceries. At that time- Listen to your body when lifting, if you have pain when lifting, stop and then try again in a few days. Soreness after doing exercises or activities of daily living is normal as you get back in to your normal routine.  MEDICATIONS:  Try to take narcotic medications and anti-inflammatory medications, such as tylenol, ibuprofen, naprosyn, etc., with food.  This will minimize stomach upset from the medication.  Should you develop nausea and vomiting from the pain medication, or develop a rash, please discontinue the medication and contact your physician.  You should not drive, make important decisions, or operate machinery when taking narcotic pain medication.  SUNBLOCK Use sun block to incision area over the next year if this area will be exposed to sun. This helps decrease scarring and will allow you avoid a permanent darkened area over your incision.  QUESTIONS:  Please feel free to call our office if you have any questions, and we will be glad to assist you. [phone]

## 2022-11-07 NOTE — Progress Notes (Unsigned)
11/07/2022  HPI: Thomas [name] is a 72 y.o. male s/p open sigmoidectomy for sigmoid volvulus on 10/17/22.  He was last seen on 10/29/22 at which time he was noted to have an area of erythema at the inferior portion of the wound without drainage, and two additional small open areas between staples.  Staples were removed and he was started on antibiotics.  The erythema subsided for the most part, but now there's an area of fluctuance in the inferior portion still present.  The two open wounds have been draining thick serosanguinous fluid.  Vital signs: BP 106/68   Pulse 62   Ht 5' 9.5" (1.765 m)   Wt 214 lb 3.2 oz (97.2 kg)   SpO2 98%   BMI 31.18 kg/m    Physical Exam: Constitutional: No acute distress Abdomen:  soft, non-distended, only sore to palpation.  The midline incision has one open area at the level of the umbilicus, with some fibrinous material at the skin edges.  This was debrided sharply with scissors.  Qtip probing showed the depth to be more shallow than before, and the cavity did not really extend superiorly or inferiorly like before.  Just inferior to that, prior open wound is almost healed.  Inferior to that is the area of fluctuance and erythema, with some peeled skin consistent with an abscess and infection.  Assessment/Plan: This is a 72 y.o. male s/p open sigmoidectomy for volvulus, with post-op wound infection and area of dehiscence.  --The superior portion at the umbilicus was debrided and is otherwise healing well.  Can continue dry gauze dressing changes there.   --The most inferior area has fluctuance and still erythema, tough the erythema is much improved compared to prior.  Still no drainage on its own, so discussed with patient the need for I&D of this to relieve the infection.  He's in agreement.   Procedure Date:  11/07/2022  Pre-operative Diagnosis:  Post-operative wound infection  Post-operative Diagnosis: Post-operative wound infection  Procedure:  Incision  and Drainage of post-operative wound infection  Surgeon:  Howie Ill, MD  Anesthesia:  4 ml of 1% lidocaine with epi  Estimated Blood Loss:  2 ml  Specimens:  culture swab  Complications:  None  Indications for Procedure:  This is a 72 y.o. male with diagnosis of a post-operative wound infection, requiring drainage procedure.  The risks of bleeding, abscess or infection, injury to surrounding structures, and need for further procedures were all discussed with the patient and was willing to proceed.  Description of Procedure: The patient was correctly identified at bedside.  Appropriate time-outs were performed prior to procedure.  The patient's lower portion of the midline wound was prepped and draped in usual sterile fashion.  Local anesthetic was infused intradermally.  An elliptical 1 cm incision was made over the abscess, revealing serosanguinous fluid.  This fluid was swabbed for culture and sent to micro.  Qtip and kelly forceps were used to dissect around the cavity tissue to open any remaining pockets of fluid.  After drainage was completed, the cavity was irrigated and cleaned.  The wound was dressed with 4x4 gauze and tape.  The patient tolerated the procedure well and all sharps were appropriately disposed of at the end of the case.  --Patient may continue tylenol or ibuprofen for pain control. --Will extend course of Augmentin as a precaution. --Instructed on dry gauze dressings --Follow up in one week.    Howie Ill, MD [facility]

## 2022-11-10 DIAGNOSIS — M1990 Unspecified osteoarthritis, unspecified site: Secondary | ICD-10-CM | POA: Diagnosis not present

## 2022-11-10 DIAGNOSIS — G3184 Mild cognitive impairment, so stated: Secondary | ICD-10-CM | POA: Diagnosis not present

## 2022-11-10 DIAGNOSIS — E876 Hypokalemia: Secondary | ICD-10-CM | POA: Diagnosis not present

## 2022-11-10 DIAGNOSIS — F32A Depression, unspecified: Secondary | ICD-10-CM | POA: Diagnosis not present

## 2022-11-10 DIAGNOSIS — Z48815 Encounter for surgical aftercare following surgery on the digestive system: Secondary | ICD-10-CM | POA: Diagnosis not present

## 2022-11-10 DIAGNOSIS — G20A1 Parkinson's disease without dyskinesia, without mention of fluctuations: Secondary | ICD-10-CM | POA: Diagnosis not present

## 2022-11-10 DIAGNOSIS — I10 Essential (primary) hypertension: Secondary | ICD-10-CM | POA: Diagnosis not present

## 2022-11-10 DIAGNOSIS — I4891 Unspecified atrial fibrillation: Secondary | ICD-10-CM | POA: Diagnosis not present

## 2022-11-10 DIAGNOSIS — D6851 Activated protein C resistance: Secondary | ICD-10-CM | POA: Diagnosis not present

## 2022-11-13 DIAGNOSIS — Z48815 Encounter for surgical aftercare following surgery on the digestive system: Secondary | ICD-10-CM | POA: Diagnosis not present

## 2022-11-13 DIAGNOSIS — E876 Hypokalemia: Secondary | ICD-10-CM | POA: Diagnosis not present

## 2022-11-13 DIAGNOSIS — D6851 Activated protein C resistance: Secondary | ICD-10-CM | POA: Diagnosis not present

## 2022-11-13 DIAGNOSIS — I10 Essential (primary) hypertension: Secondary | ICD-10-CM | POA: Diagnosis not present

## 2022-11-13 DIAGNOSIS — F32A Depression, unspecified: Secondary | ICD-10-CM | POA: Diagnosis not present

## 2022-11-13 DIAGNOSIS — G3184 Mild cognitive impairment, so stated: Secondary | ICD-10-CM | POA: Diagnosis not present

## 2022-11-13 DIAGNOSIS — M1990 Unspecified osteoarthritis, unspecified site: Secondary | ICD-10-CM | POA: Diagnosis not present

## 2022-11-13 DIAGNOSIS — G20A1 Parkinson's disease without dyskinesia, without mention of fluctuations: Secondary | ICD-10-CM | POA: Diagnosis not present

## 2022-11-13 DIAGNOSIS — I4891 Unspecified atrial fibrillation: Secondary | ICD-10-CM | POA: Diagnosis not present

## 2022-11-14 ENCOUNTER — Encounter: Payer: Self-pay | Admitting: Family Medicine

## 2022-11-14 ENCOUNTER — Ambulatory Visit (INDEPENDENT_AMBULATORY_CARE_PROVIDER_SITE_OTHER): Payer: Medicare HMO | Admitting: Surgery

## 2022-11-14 ENCOUNTER — Encounter: Payer: Self-pay | Admitting: Surgery

## 2022-11-14 ENCOUNTER — Ambulatory Visit: Payer: Self-pay | Admitting: *Deleted

## 2022-11-14 ENCOUNTER — Ambulatory Visit (INDEPENDENT_AMBULATORY_CARE_PROVIDER_SITE_OTHER): Payer: Medicare HMO | Admitting: Family Medicine

## 2022-11-14 ENCOUNTER — Encounter: Payer: Medicare HMO | Admitting: Surgery

## 2022-11-14 VITALS — BP 130/78 | HR 64 | Wt 217.4 lb

## 2022-11-14 VITALS — BP 81/49 | HR 71 | Temp 97.5°F | Wt 212.8 lb

## 2022-11-14 DIAGNOSIS — I950 Idiopathic hypotension: Secondary | ICD-10-CM

## 2022-11-14 DIAGNOSIS — T8149XA Infection following a procedure, other surgical site, initial encounter: Secondary | ICD-10-CM

## 2022-11-14 DIAGNOSIS — K562 Volvulus: Secondary | ICD-10-CM

## 2022-11-14 DIAGNOSIS — Z09 Encounter for follow-up examination after completed treatment for conditions other than malignant neoplasm: Secondary | ICD-10-CM

## 2022-11-14 NOTE — Patient Instructions (Addendum)
Please call your Primary Care today about your low blood pressure.   Minimally Invasive Partial Colectomy, Adult, Care After The following information offers guidance on how to care for yourself after your procedure. Your health care provider may also give you more specific instructions. If you have problems or questions, contact your health care provider. What can I expect after the surgery? After the procedure, it is common to have: Pain, bruising, and swelling. Bloating. Weakness and tiredness (fatigue). Changes to your bowel movements, especially having bowel movements more often. Follow these instructions at home: Medicines Take over-the-counter and prescription medicines only as told by your health care provider. If you were prescribed an antibiotic medicine, take it as told by your health care provider. Do not stop using the antibiotic even if you start to feel better. Ask your health care provider if the medicine prescribed to you: Requires you to avoid driving or using machinery. Can cause constipation. You may need to take these actions to prevent or treat constipation: Drink enough fluids to keep your urine pale yellow. Take over-the-counter or prescription medicines. Limit foods that are high in fat and processed sugars, such as fried or sweet foods. Eating and drinking Follow instructions from your health care provider about what you may eat and drink. Do not drink alcohol if your health care provider tells you not to drink. Eat a low-fiber diet for the first 4 weeks after surgery or as told by your health care provider.. Most people on a low-fiber eating plan should eat less than 10 grams (g) of fiber a day. Follow recommendations from your health care provider or dietitian about how much fiber you should have each day. Always check food labels to know the fiber content of packaged foods. In general, a low-fiber food will have fewer than 2 g of fiber per serving. In general,  try to avoid whole grains, raw fruits and vegetables, dried fruit, tough cuts of meat, nuts, and seeds. Incision care  Follow instructions from your health care provider about how to take care of your incisions. Make sure you: Wash your hands with soap and water for at least 20 seconds before and after you change your bandage (dressing). If soap and water are not available, use hand sanitizer. Change your dressing as told by your health care provider. Leave stitches (sutures), skin glue, or adhesive strips in place. These skin closures may need to stay in place for 2 weeks or longer. If adhesive strip edges start to loosen and curl up, you may trim the loose edges. Do not remove adhesive strips completely unless your health care provider tells you to do that. Check your incision area every day for signs of infection. Check for: More redness, swelling, or pain. Fluid or blood. Warmth. Pus or a bad smell. Activity Rest as told by your health care provider. Avoid sitting for a long time without moving. Get up to take short walks every 1-2 hours. This is important to improve blood flow and breathing. Ask for help if you feel weak or unsteady. You may have to avoid lifting. Ask your health care provider how much you can safely lift. Return to your normal activities as told by your health care provider. Ask your health care provider what activities are safe for you. General instructions Do not use any products that contain nicotine or tobacco. These products include cigarettes, chewing tobacco, and vaping devices, such as e-cigarettes. If you need help quitting, ask your health care provider. Do not  take baths, swim, or use a hot tub until your health care provider approves. Ask your health care provider if you may take showers. You may only be allowed to take sponge baths. Wear compression stockings as told by your health care provider. These stockings help to prevent blood clots and reduce swelling  in your legs. Keep all follow-up visits. This is important to monitor healing and check for any complications. Contact a health care provider if: Medicine is not controlling your pain. You have chills or fever. You have any signs of infection in your incision areas. You have a persistent cough. You have nausea or vomiting. You develop a rash. You have not had a bowel movement in 3 days. Get help right away if: You have severe pain. Your incisions break open after sutures or staples have been removed. You are bleeding from your rectum or have blood in your stool. You have a warm, tender swelling in your leg. You have chest pain or trouble breathing. You have increased swelling in the abdomen. You feel light-headed or you faint. These symptoms may be an emergency. Get help right away. Call 911. Do not wait to see if the symptoms will go away. Do not drive yourself to the hospital. Summary After surgery, it is common to have some pain, bruising, swelling, bloating, tiredness, weakness, or changes to your bowel movements. Follow instructions from your health care provider about what to eat and drink. Return to your normal activities as told by your health care provider. Check your incision area every day for signs of infection. Get help right away if you have chest pain or trouble breathing. This information is not intended to replace advice given to you by your health care provider. Make sure you discuss any questions you have with your health care provider. Document Revised: 07/17/2021 Document Reviewed: 07/17/2021 Elsevier Patient Education  2024 [employer].

## 2022-11-14 NOTE — Progress Notes (Signed)
11/14/2022  HPI: Thomas [name] is a 72 y.o. male s/p open sigmoidectomy on 10/17/2022.  He had complication of a postoperative wound abscess which required I&D on 11/07/2022.  Cultures from this grew Staph aureus which was sensitive to the Augmentin.  Patient reports that he has been doing well and the wound has been getting smaller and almost requires no dressing.  Denies any worsening pain.  Of note, his blood pressure today is a little bit low.  He did take all his blood pressure medications this morning.  Vital signs: BP (!) 81/49   Pulse 71   Temp (!) 97.5 F (36.4 C) (Oral)   Wt 212 lb 12.8 oz (96.5 kg)   SpO2 98%   BMI 30.97 kg/m    Physical Exam: Constitutional: No acute distress Abdomen: Soft, nondistended, nontender to palpation.  Midline incision is almost fully healed with a very small area of scab/fibrinous material in the vicinity of the umbilicus.  This was removed today without complications revealing almost healed skin underneath.  The I&D site is now fully healed without any further opening.  Dry gauze dressing applied as precaution.  Assessment/Plan: This is a 72 y.o. male s/p open sigmoidectomy followed by I&D of postoperative wound abscess.  - Patient continues to do well and his wounds are almost fully healed.  No further evidence of infection.  He has completed antibiotic course and no further antibiotics are needed at this point.  Instructed on dry gauze dressing until the wound is fully healed but otherwise no further issues at this point. -With regards to his blood pressure, the patient currently is asymptomatic but I encouraged the patient to call his PCPs office to discuss his blood pressure numbers.  It may be that he needs to hold some of his medications or see his PCP sooner to adjust dosages. - Patient will follow-up as needed.   Howie Ill, MD [facility]

## 2022-11-14 NOTE — Telephone Encounter (Signed)
  Chief Complaint: low BP fluctuating with legs elevated vs dangling on floor  Symptoms: denies sx . BP 104/59 HR 66 legs elevated. BP 85/50 HR 70 with legs dangling/ feet on floor sitting in recliner. Hx of low BP  Frequency: today  Pertinent Negatives: Patient denies chest pain no difficulty breathing reported no dizziness no lightheadedness. No pale in color no cool clammy skin .  Disposition: [] ED /[] Urgent Care (no appt availability in office) / [x] Appointment(In office/virtual)/ []  [facility]/ [] Home Care/ [] Refused Recommended Disposition /[] [facility]/ []  Follow-up with PCP Additional Notes:   Appt scheduled for to day with provider not PCP due to no available appt. Recommended if sx noted and BP less than 80 systolic go to ED / call 911. Patient does take amlodipine and lisinopril at night.     Reason for Disposition  [1] Systolic BP < 90 AND [2] NOT dizzy, lightheaded or weak  Answer Assessment - Initial Assessment Questions 1. BLOOD PRESSURE: "What is the blood pressure?" "Did you take at least two measurements 5 minutes apart?"     BP 104/59 heart rate 66 with legs elevated in recliner. BP 85/50 heart rate 70 with legs down and feet on floor in sitting position  2. ONSET: "When did you take your blood pressure?"     Now and was 81/49 3. HOW: "How did you obtain the blood pressure?" (e.g., visiting nurse, automatic home BP monitor)     Home automatic BP monitor  4. HISTORY: "Do you have a history of low blood pressure?" "What is your blood pressure normally?"     Yes  5. MEDICINES: "Are you taking any medications for blood pressure?" If Yes, ask: "Have they been changed recently?"     Lisinopril last night and amlodipine  6. PULSE RATE: "Do you know what your pulse rate is?"      71 during dr visit but now 66 7. OTHER SYMPTOMS: "Have you been sick recently?" "Have you had a recent injury?"     S/p surgery 10/17/22. 8. PREGNANCY: "Is there any chance you  are pregnant?" "When was your last menstrual period?"     na  Protocols used: Blood Pressure - Low-A-AH

## 2022-11-14 NOTE — Assessment & Plan Note (Signed)
Acute, stable. BP at general surgery 81/49, today in office 130/78. CBC done to rule out anemia, UA done today, normal specific gravity. Recommend increase water intake to 100 oz daily, eating x 3 balanced meals daily, ensure for protein. Recommend checking BP twice daily during different times of the day. Return in 2 weeks.

## 2022-11-14 NOTE — Patient Instructions (Addendum)
Check BP twice daily and record readings Record if patient starts feeling dizzy and lightheaded associated with low BP readings X3 balanced meals daily 100 oz of water daily.

## 2022-11-14 NOTE — Progress Notes (Signed)
BP 130/78   Pulse 64   Wt 217 lb 6.4 oz (98.6 kg)   SpO2 99%   BMI 31.64 kg/m    Subjective:    Patient ID: Thomas [name], male    DOB: November 21, 1950, 72 y.o.   MRN: [id number]  HPI: Thomas [name] is a 72 y.o. male  Chief Complaint  Patient presents with   Hypotension   HYPOTENSION  BP reading at general surgery office this morning 81/49, 85/50 with no associated symptoms. He is taking Amlodipine 2.5 mg,  Lisinopril 30 mg, and PRN Hydralazine 25mg  x3 if SBP >160. Denies feeling light headed or dizzy.  Satisfied with current treatment? yes BP monitoring frequency:  daily BP range: 100-130/50-76, 7/15: 100/70 7/16: 100/60 7/17: 133/76.  BP medication side effects:  no Medication compliance: excellent compliance Aspirin: no Recurrent headaches: no Visual changes: no Palpitations: no Dyspnea: no Chest pain: no Lower extremity edema: no Dizzy/lightheaded: no   Relevant past medical, surgical, family and social history reviewed and updated as indicated. Interim medical history since our last visit reviewed. Allergies and medications reviewed and updated.  Review of Systems  Constitutional:  Negative for fatigue.  Respiratory: Negative.    Cardiovascular: Negative.   Gastrointestinal:  Negative for constipation, diarrhea, nausea and vomiting.  Neurological:  Negative for dizziness, syncope, light-headedness and headaches.    Per HPI unless specifically indicated above     Objective:    BP 130/78   Pulse 64   Wt 217 lb 6.4 oz (98.6 kg)   SpO2 99%   BMI 31.64 kg/m   Wt Readings from Last 3 Encounters:  11/14/22 217 lb 6.4 oz (98.6 kg)  11/14/22 212 lb 12.8 oz (96.5 kg)  11/07/22 214 lb 3.2 oz (97.2 kg)    Physical Exam Vitals and nursing note reviewed.  Constitutional:      General: He is awake. He is not in acute distress.    Appearance: Normal appearance. He is well-developed and well-groomed. He is obese. He is not ill-appearing.  HENT:     Head:  Normocephalic and atraumatic.     Right Ear: Hearing and external ear normal. No drainage.     Left Ear: Hearing and external ear normal. No drainage.     Nose: Nose normal.  Eyes:     General: Lids are normal.        Right eye: No discharge.        Left eye: No discharge.     Conjunctiva/sclera: Conjunctivae normal.  Cardiovascular:     Rate and Rhythm: Normal rate and regular rhythm.     Pulses:          Radial pulses are 2+ on the right side and 2+ on the left side.       Posterior tibial pulses are 2+ on the right side and 2+ on the left side.     Heart sounds: Normal heart sounds, S1 normal and S2 normal. No murmur heard.    No gallop.  Pulmonary:     Effort: Pulmonary effort is normal. No accessory muscle usage or respiratory distress.     Breath sounds: Normal breath sounds.  Musculoskeletal:        General: Normal range of motion.     Cervical back: Full passive range of motion without pain and normal range of motion.     Right lower leg: No edema.     Left lower leg: No edema.  Skin:    General: Skin is  warm and dry.     Capillary Refill: Capillary refill takes less than 2 seconds.  Neurological:     Mental Status: He is alert and oriented to person, place, and time.  Psychiatric:        Attention and Perception: Attention normal.        Mood and Affect: Mood normal.        Speech: Speech normal.        Behavior: Behavior normal. Behavior is cooperative.        Thought Content: Thought content normal.     Results for orders placed or performed in visit on 11/07/22  Anaerobic and Aerobic Culture   Specimen: Abscess   Abscess Incisional abs  Result Value Ref Range   Anaerobic Culture Final report    Result 1 Comment    Aerobic Culture Final report (A)    Result 1 Staphylococcus aureus (A)    Antimicrobial Susceptibility Comment       Assessment & Plan:   Problem List Items Addressed This Visit     Idiopathic hypotension - Primary    Acute, stable. BP at  general surgery 81/49, today in office 130/78. CBC done to rule out anemia, UA done today, normal specific gravity. Recommend increase water intake to 100 oz daily, eating x 3 balanced meals daily, ensure for protein. Recommend checking BP twice daily during different times of the day. Return in 2 weeks.       Relevant Orders   CBC w/Diff   Urinalysis, Routine w reflex microscopic     Follow up plan: Return in about 2 weeks (around 11/28/2022) for BP Recheck, hypotension.

## 2022-11-15 DIAGNOSIS — Z48815 Encounter for surgical aftercare following surgery on the digestive system: Secondary | ICD-10-CM | POA: Diagnosis not present

## 2022-11-15 DIAGNOSIS — I10 Essential (primary) hypertension: Secondary | ICD-10-CM | POA: Diagnosis not present

## 2022-11-15 DIAGNOSIS — E876 Hypokalemia: Secondary | ICD-10-CM | POA: Diagnosis not present

## 2022-11-15 DIAGNOSIS — D6851 Activated protein C resistance: Secondary | ICD-10-CM | POA: Diagnosis not present

## 2022-11-15 DIAGNOSIS — G20A1 Parkinson's disease without dyskinesia, without mention of fluctuations: Secondary | ICD-10-CM | POA: Diagnosis not present

## 2022-11-15 DIAGNOSIS — I4891 Unspecified atrial fibrillation: Secondary | ICD-10-CM | POA: Diagnosis not present

## 2022-11-15 DIAGNOSIS — F32A Depression, unspecified: Secondary | ICD-10-CM | POA: Diagnosis not present

## 2022-11-15 DIAGNOSIS — M1990 Unspecified osteoarthritis, unspecified site: Secondary | ICD-10-CM | POA: Diagnosis not present

## 2022-11-15 DIAGNOSIS — G3184 Mild cognitive impairment, so stated: Secondary | ICD-10-CM | POA: Diagnosis not present

## 2022-11-17 ENCOUNTER — Ambulatory Visit: Payer: Self-pay | Admitting: *Deleted

## 2022-11-17 DIAGNOSIS — F32A Depression, unspecified: Secondary | ICD-10-CM | POA: Diagnosis not present

## 2022-11-17 DIAGNOSIS — I10 Essential (primary) hypertension: Secondary | ICD-10-CM | POA: Diagnosis not present

## 2022-11-17 DIAGNOSIS — E876 Hypokalemia: Secondary | ICD-10-CM | POA: Diagnosis not present

## 2022-11-17 DIAGNOSIS — D6851 Activated protein C resistance: Secondary | ICD-10-CM | POA: Diagnosis not present

## 2022-11-17 DIAGNOSIS — G20A1 Parkinson's disease without dyskinesia, without mention of fluctuations: Secondary | ICD-10-CM | POA: Diagnosis not present

## 2022-11-17 DIAGNOSIS — G3184 Mild cognitive impairment, so stated: Secondary | ICD-10-CM | POA: Diagnosis not present

## 2022-11-17 DIAGNOSIS — M1990 Unspecified osteoarthritis, unspecified site: Secondary | ICD-10-CM | POA: Diagnosis not present

## 2022-11-17 DIAGNOSIS — Z48815 Encounter for surgical aftercare following surgery on the digestive system: Secondary | ICD-10-CM | POA: Diagnosis not present

## 2022-11-17 DIAGNOSIS — I4891 Unspecified atrial fibrillation: Secondary | ICD-10-CM | POA: Diagnosis not present

## 2022-11-17 NOTE — Patient Outreach (Signed)
  Care Coordination   Follow Up Visit Note   11/17/2022 Name: Oreste Castiglioni MRN: [id number] DOB: 11-Jan-1951  Chancey Blauser is a 72 y.o. year old male who sees Dorcas Carrow, DO for primary care. I spoke with wife of Noaah Schuele by phone today.  What matters to the patients health and wellness today?  Wife report patient is much better.  Does not wish to have follow up calls at this time, will call this RNCM with questions/concerns in the future.     Goals Addressed             This Visit's Progress    COMPLETED: Recover from abdominal surgery   On track      Interventions Today    Flowsheet Row Most Recent Value  Chronic Disease   Chronic disease during today's visit Other  [abdominal surgery]  General Interventions   General Interventions Discussed/Reviewed General Interventions Reviewed, Doctor Visits, Durable Medical Equipment (DME)  [Neurology 8/9]  Doctor Visits Discussed/Reviewed Doctor Visits Reviewed, Specialist  [Abdominal wound healed, released from surgeon's care]  Durable Medical Equipment (DME) BP Cuff  [Wife report blood pressure has been labile, ranging 80s-170s systolic, patient is asymptomatic.  Wife will call PCP office for follow up appointment]  PCP/Specialist Visits Compliance with follow-up visit  [PCP and general surgeon both seen on 8/2]  Education Interventions   Education Provided Provided Education  Provided Verbal Education On Other, When to see the doctor  [Encouarged to continue monitoring for signs of recurrent infection]              SDOH assessments and interventions completed:  No     Care Coordination Interventions:  Yes, provided   Follow up plan: No further intervention required.   Encounter Outcome:  Pt. Visit Completed   Kemper Durie, RN, MSN, [facility] Care Management Care Management Coordinator [phone]

## 2022-11-19 NOTE — Progress Notes (Signed)
Assessment/Plan:    1.  Parkinsons Disease             -Continue ropinirole, 3 mg 3 times per day.  Discussed how medication influences memory but I don't feel need to take this one off right now             -Continue carbidopa/levodopa 25/100, 2 tablets at 6 AM/10 AM/2 PM/6 PM             -Continue carbidopa/levodopa 50/200 CR at bedtime  -He is following regularly with dermatology.   -invited to Parkinsons Disease symposium  -pt doing well with inbrija. Uses rarely  2.  Parkinson's dyskinesia             -Continue amantadine, 100 mg 3 times per day.    -Did discuss that amantadine can affect memory and we will need to watch that closely.  At this point in time, I am not convinced he has PDD.   3.  RBD             -Discussed safety.  Declines medication right now.   4.  MCI             -Neurocognitive test in August, 2020 and March, 2024 without evidence of dementia.  He did have some decline between the 2 tests, but diagnosis was MCI     5.  Neurogenic Orthostatic Hypotension  -BP and pulse low today             -likely due to BP meds in combination with Parkinsons Disease.   He is on hydralazine, lisinopril, metoprolol and tamsulosin which all are driving down blood pressure as well.   Discussed concept of permissive HTN.  I don't think he needs so much med.  He has appt with pcp to discuss.   6.    Lumbar spinal stenosis and neural foraminal stenosis             -status post surgical intervention with microdiscectomy at L3-L4 and L4-L5 on October 11, 2020 with Dr. Venetia Maxon.  He is currently following with PMR at the [facility] clinic and receiving injections  7.  Sleep disturbance  -On trazodone, prescribed by primary care  -On melatonin, 5 mg nightly  Subjective:   Thomas [name] was seen today in follow up for Parkinsons disease.  My previous records were reviewed prior to todays visit as well as outside records available to me.  Pt with wife who supplements hx.  Patient was in  the hospital in July for sigmoid volvulus, after presenting with abdominal pain and diarrhea.  He did have to have surgery, requiring open sigmoidectomy with colorectal anastomosis on July 5.  He was discharged to home on July 9.  Home therapies did come to the home.  He ended up having a postop wound abscess that required I&D on July 26.  Blood pressure at his surgeon's office on August 2 was only 81/49.  It was better when checked at the nurse practitioner office on that same day.  It is low again today and he states that he has had fluctuating BP's, in part b/c he was taking the zocor instead of his BP med.  He has that straighted out and the BP is running more on the lower side.  He isn't lightheaded or dizzy.  He has a f/u with PCP soon.   He is starting to move and exercise again.  No hallucinations.  He does have vivid dreams.  Since our last visit, the patient did have repeat neurocognitive testing.  This was done in March, 2024.  Compared to 2020.  He did have cognitive decline, but he did not meet criteria for dementia.  He continues to meet criteria for mild cognitive impairment.  Current prescribed movement disorder medications:   Ropinirole, 3 mg 3 times per day Carbidopa/levodopa 25/100, 2 tablets at 6 AM/2 tablets at 10 AM/2 tablets at 2 PM/2 tablets at 6 PM Amantadine 100 mg 3 times per day carbidopa/levodopa 50/200 CR q hs Inbrija (started last visit)   ALLERGIES:   Allergies  Allergen Reactions   Codeine Other (See Comments)    Hallucinations   Other Nausea Only    General anesthesia     CURRENT MEDICATIONS:  Outpatient Encounter Medications as of 11/21/2022  Medication Sig   acetaminophen (TYLENOL) 500 MG tablet Take 1,000 mg by mouth daily.   amantadine (SYMMETREL) 100 MG capsule TAKE ONE CAPSULE BY MOUTH THREE TIMES A DAY (Patient taking differently: Take 100 mg by mouth 3 (three) times daily.)   amLODipine (NORVASC) 2.5 MG tablet TAKE 1 TABLET (2.5 MG TOTAL) BY MOUTH  DAILY AS NEEDED (BLOOD PRESSURE GREATER THAN 160/90). TAKE IN THE MORNING (Patient taking differently: Take 2.5 mg by mouth at bedtime. TAKE 1 TABLET (2.5 MG TOTAL) BY MOUTH DAILY AS NEEDED (BLOOD PRESSURE GREATER THAN 160/90). TAKE IN THE MORNING)   calcium carbonate (TUMS - DOSED IN MG ELEMENTAL CALCIUM) 500 MG chewable tablet Chew 1 tablet by mouth daily as needed for indigestion or heartburn.   carbidopa-levodopa (SINEMET CR) 50-200 MG tablet TAKE 1 TABLET BY MOUTH AT BEDTIME   carbidopa-levodopa (SINEMET IR) 25-100 MG tablet TAKE TWO TABLETS BY MOUTH FOUR TIMES A DAY AT 6AM, 10AM, 2PM. AND 6PM. (Patient taking differently: 2 tablets 4 (four) times daily. TAKE TWO TABLETS BY MOUTH FOUR TIMES A DAY AT 6AM, 10AM, 2PM. AND 6PM.)   cetirizine (ZYRTEC) 10 MG tablet Take 10 mg by mouth at bedtime.   cholecalciferol (VITAMIN D3) 25 MCG (1000 UNIT) tablet Take 1,000 Units by mouth daily.   diclofenac Sodium (VOLTAREN) 1 % GEL APPLY TOPICALLY 4 GRAMS FOUR TIMES DAILY (Patient taking differently: Apply 4 g topically 4 (four) times daily as needed (pain).)   hydrALAZINE (APRESOLINE) 25 MG tablet Take 1 tablet (25 mg total) by mouth 3 (three) times daily as needed (for SBP >160). Takes in the evening   HYDROcodone-acetaminophen (NORCO/VICODIN) 5-325 MG tablet Take 1 tablet by mouth 2 (two) times daily.   Levodopa (INBRIJA) 42 MG CAPS Samples of this drug were given to the patient, quantity 1 box of 32 capsules, Lot Number [id number] Exp: 03/2023  Patient has been educated on dosage and uses of medication.   Levodopa (INBRIJA) 42 MG CAPS Samples of this drug were given to the patient, quantity 1, Lot Number [id number] Exp 12-24   lisinopril (ZESTRIL) 30 MG tablet Take 1 tablet (30 mg total) by mouth daily.   melatonin 5 MG TABS Take 5 mg by mouth at bedtime.   methocarbamol (ROBAXIN) 500 MG tablet Take 1 tablet (500 mg total) by mouth every 6 (six) hours as needed for muscle spasms.   metoprolol succinate  (TOPROL-XL) 25 MG 24 hr tablet Take 1 tablet (25 mg total) by mouth at bedtime.   Multiple Vitamin (MULTIVITAMIN) tablet Take 1 tablet by mouth daily at 6 PM.   oxybutynin (DITROPAN-XL) 10 MG 24 hr tablet Take 1 tablet (10 mg total) by mouth daily.  Polyethyl Glycol-Propyl Glycol (SYSTANE ULTRA) 0.4-0.3 % SOLN Place 1 drop into both eyes daily as needed (DRY EYES).   polyethylene glycol powder (GLYCOLAX/MIRALAX) 17 GM/SCOOP powder Take 17 g by mouth at bedtime.   PREVIDENT 5000 BOOSTER PLUS 1.1 % PSTE Place 1 application onto teeth 2 (two) times daily.   rivaroxaban (XARELTO) 20 MG TABS tablet Take 1 tablet (20 mg total) by mouth daily.   rOPINIRole (REQUIP) 3 MG tablet TAKE 1 TABLET BY MOUTH 3 TIMES A DAY   simvastatin (ZOCOR) 40 MG tablet Take 1 tablet (40 mg total) by mouth daily.   tamsulosin (FLOMAX) 0.4 MG CAPS capsule Take 2 capsules (0.8 mg total) by mouth daily.   traZODone (DESYREL) 50 MG tablet Take 1 tablet (50 mg total) by mouth at bedtime.   No facility-administered encounter medications on file as of 11/21/2022.    Objective:   PHYSICAL EXAMINATION:    VITALS:   Vitals:   11/21/22 1253  BP: (!) 104/58  Pulse: (!) 55  SpO2: 98%  Weight: 210 lb (95.3 kg)  Height: 5\' 9"  (1.753 m)   GEN:  The patient appears stated age and is in NAD. HEENT:  Normocephalic, atraumatic.  The mucous membranes are moist. The superficial temporal arteries are without ropiness or tenderness.  Neurological examination:  Orientation: The patient is alert and oriented x3.  He is able to provide his own history and ask appropriate questions. Cranial nerves: There is good facial symmetry with facial hypomimia. The speech is fluent and clear. Soft palate rises symmetrically and there is no tongue deviation. Hearing is intact to conversational tone. Sensation: Sensation is intact to light touch throughout Motor: Strength is at least antigravity x4.  Movement examination: Tone: There is normal tone  in the UE/LE Abnormal movements: he has some mild L leg dyskinesia Coordination:  There is good rapid alternating movements today with the exception of dec foot taps on the L Gait and Station: The patient pushes off to arise with the right hand.  The patient's stride length has slightly decreased stride length and decreased arm swing.  He is flexed at waist  I have reviewed and interpreted the following labs independently    Chemistry      Component Value Date/Time   NA 137 10/18/2022 0219   NA 138 07/28/2022 1049   K 3.6 10/18/2022 0219   CL 108 10/18/2022 0219   CO2 20 (L) 10/18/2022 0219   BUN 13 10/18/2022 0219   BUN 17 07/28/2022 1049   CREATININE 0.90 10/18/2022 0219      Component Value Date/Time   CALCIUM 8.7 (L) 10/18/2022 0219   ALKPHOS 79 10/15/2022 0931   AST 14 (L) 10/15/2022 0931   ALT 6 10/15/2022 0931   BILITOT 1.0 10/15/2022 0931   BILITOT 0.6 07/28/2022 1049       Lab Results  Component Value Date   WBC 5.7 11/14/2022   HGB 12.9 (L) 11/14/2022   HCT 38.6 11/14/2022   MCV 92 11/14/2022   PLT 197 11/14/2022    Lab Results  Component Value Date   TSH 0.894 07/28/2022    Total time spent on today's visit was 30 minutes, including both face-to-face time and nonface-to-face time.  Time included that spent on review of records (prior notes available to me/labs/imaging if pertinent), discussing treatment and goals, answering patient's questions and coordinating care.   Cc:  Olevia Perches P, DO

## 2022-11-20 DIAGNOSIS — Z48815 Encounter for surgical aftercare following surgery on the digestive system: Secondary | ICD-10-CM | POA: Diagnosis not present

## 2022-11-20 DIAGNOSIS — G3184 Mild cognitive impairment, so stated: Secondary | ICD-10-CM | POA: Diagnosis not present

## 2022-11-20 DIAGNOSIS — E876 Hypokalemia: Secondary | ICD-10-CM | POA: Diagnosis not present

## 2022-11-20 DIAGNOSIS — F32A Depression, unspecified: Secondary | ICD-10-CM | POA: Diagnosis not present

## 2022-11-20 DIAGNOSIS — I10 Essential (primary) hypertension: Secondary | ICD-10-CM | POA: Diagnosis not present

## 2022-11-20 DIAGNOSIS — M1990 Unspecified osteoarthritis, unspecified site: Secondary | ICD-10-CM | POA: Diagnosis not present

## 2022-11-20 DIAGNOSIS — G20A1 Parkinson's disease without dyskinesia, without mention of fluctuations: Secondary | ICD-10-CM | POA: Diagnosis not present

## 2022-11-20 DIAGNOSIS — D6851 Activated protein C resistance: Secondary | ICD-10-CM | POA: Diagnosis not present

## 2022-11-20 DIAGNOSIS — I4891 Unspecified atrial fibrillation: Secondary | ICD-10-CM | POA: Diagnosis not present

## 2022-11-21 ENCOUNTER — Ambulatory Visit: Payer: Medicare HMO | Admitting: Neurology

## 2022-11-21 ENCOUNTER — Encounter: Payer: Self-pay | Admitting: Neurology

## 2022-11-21 VITALS — BP 104/58 | HR 55 | Ht 69.0 in | Wt 210.0 lb

## 2022-11-21 DIAGNOSIS — G20B1 Parkinson's disease with dyskinesia, without mention of fluctuations: Secondary | ICD-10-CM

## 2022-11-21 NOTE — Patient Instructions (Addendum)
 SAVE THE DATE!  We are planning a Parkinsons Disease educational symposium at [facility] in [place] on October 11.  More details to come!  If you would like to be added to our email list to get further information, email [email or web address].  We hope to see you there!

## 2022-11-26 DIAGNOSIS — Z7901 Long term (current) use of anticoagulants: Secondary | ICD-10-CM | POA: Diagnosis not present

## 2022-11-26 DIAGNOSIS — G20A1 Parkinson's disease without dyskinesia, without mention of fluctuations: Secondary | ICD-10-CM | POA: Diagnosis not present

## 2022-11-26 DIAGNOSIS — Z9889 Other specified postprocedural states: Secondary | ICD-10-CM | POA: Diagnosis not present

## 2022-11-27 ENCOUNTER — Ambulatory Visit (INDEPENDENT_AMBULATORY_CARE_PROVIDER_SITE_OTHER): Payer: Medicare HMO | Admitting: Family Medicine

## 2022-11-27 ENCOUNTER — Encounter: Payer: Self-pay | Admitting: Family Medicine

## 2022-11-27 VITALS — BP 120/68 | HR 50 | Temp 97.4°F | Wt 218.6 lb

## 2022-11-27 DIAGNOSIS — I10 Essential (primary) hypertension: Secondary | ICD-10-CM | POA: Diagnosis not present

## 2022-11-27 NOTE — Progress Notes (Signed)
BP 120/68   Pulse (!) 50   Temp (!) 97.4 F (36.3 C) (Oral)   Wt 218 lb 9.6 oz (99.2 kg)   SpO2 98%   BMI 32.28 kg/m    Subjective:    Patient ID: Thomas [name], male    DOB: Jul 19, 1950, 72 y.o.   MRN: [id number]  HPI: Thomas [name] is a 72 y.o. male  Chief Complaint  Patient presents with   Hypertension   HYPERTENSION  Hypertension status: stable  Satisfied with current treatment? yes Duration of hypertension: chronic BP monitoring frequency:  a few times a week BP range: 120/70, 119/67, 104/50, 124/64, 140/73, 137/73 BP medication side effects:  no Medication compliance: excellent compliance Previous BP meds:lisinopril, amlodipine, hydralazine PRN Aspirin: no Recurrent headaches: no Visual changes: no Palpitations: no Dyspnea: no Chest pain: no Lower extremity edema: no Dizzy/lightheaded: no  Relevant past medical, surgical, family and social history reviewed and updated as indicated. Interim medical history since our last visit reviewed. Allergies and medications reviewed and updated.  Review of Systems  Constitutional: Negative.   Respiratory: Negative.    Cardiovascular: Negative.   Musculoskeletal:  Positive for back pain and myalgias. Negative for arthralgias, gait problem, joint swelling, neck pain and neck stiffness.  Skin: Negative.   Psychiatric/Behavioral: Negative.      Per HPI unless specifically indicated above     Objective:    BP 120/68   Pulse (!) 50   Temp (!) 97.4 F (36.3 C) (Oral)   Wt 218 lb 9.6 oz (99.2 kg)   SpO2 98%   BMI 32.28 kg/m   Wt Readings from Last 3 Encounters:  11/27/22 218 lb 9.6 oz (99.2 kg)  11/21/22 210 lb (95.3 kg)  11/14/22 217 lb 6.4 oz (98.6 kg)    Physical Exam Vitals and nursing note reviewed.  Constitutional:      General: He is not in acute distress.    Appearance: Normal appearance. He is not ill-appearing, toxic-appearing or diaphoretic.  HENT:     Head: Normocephalic and atraumatic.      Right Ear: External ear normal.     Left Ear: External ear normal.     Nose: Nose normal.     Mouth/Throat:     Mouth: Mucous membranes are moist.     Pharynx: Oropharynx is clear.  Eyes:     General: No scleral icterus.       Right eye: No discharge.        Left eye: No discharge.     Extraocular Movements: Extraocular movements intact.     Conjunctiva/sclera: Conjunctivae normal.     Pupils: Pupils are equal, round, and reactive to light.  Cardiovascular:     Rate and Rhythm: Normal rate and regular rhythm.     Pulses: Normal pulses.     Heart sounds: Normal heart sounds. No murmur heard.    No friction rub. No gallop.  Pulmonary:     Effort: Pulmonary effort is normal. No respiratory distress.     Breath sounds: Normal breath sounds. No stridor. No wheezing, rhonchi or rales.  Chest:     Chest wall: No tenderness.  Musculoskeletal:        General: Normal range of motion.     Cervical back: Normal range of motion and neck supple.  Skin:    General: Skin is warm and dry.     Capillary Refill: Capillary refill takes less than 2 seconds.     Coloration: Skin is not jaundiced  or pale.     Findings: No bruising, erythema, lesion or rash.  Neurological:     General: No focal deficit present.     Mental Status: He is alert and oriented to person, place, and time. Mental status is at baseline.  Psychiatric:        Mood and Affect: Mood normal.        Behavior: Behavior normal.        Thought Content: Thought content normal.        Judgment: Judgment normal.     Results for orders placed or performed in visit on 11/14/22  CBC w/Diff  Result Value Ref Range   WBC 5.7 3.4 - 10.8 x10E3/uL   RBC 4.22 4.14 - 5.80 x10E6/uL   Hemoglobin 12.9 (L) 13.0 - 17.7 g/dL   Hematocrit 81.1 91.4 - 51.0 %   MCV 92 79 - 97 fL   MCH 30.6 26.6 - 33.0 pg   MCHC 33.4 31.5 - 35.7 g/dL   RDW 78.2 95.6 - 21.3 %   Platelets 197 150 - 450 x10E3/uL   Neutrophils 61 Not Estab. %   Lymphs 23 Not  Estab. %   Monocytes 9 Not Estab. %   Eos 6 Not Estab. %   Basos 1 Not Estab. %   Neutrophils Absolute 3.5 1.4 - 7.0 x10E3/uL   Lymphocytes Absolute 1.3 0.7 - 3.1 x10E3/uL   Monocytes Absolute 0.5 0.1 - 0.9 x10E3/uL   EOS (ABSOLUTE) 0.3 0.0 - 0.4 x10E3/uL   Basophils Absolute 0.1 0.0 - 0.2 x10E3/uL   Immature Granulocytes 0 Not Estab. %   Immature Grans (Abs) 0.0 0.0 - 0.1 x10E3/uL  Urinalysis, Routine w reflex microscopic  Result Value Ref Range   Specific Gravity, UA 1.025 1.005 - 1.030   pH, UA 6.5 5.0 - 7.5   Color, UA Yellow Yellow   Appearance Ur Clear Clear   Leukocytes,UA Negative Negative   Protein,UA Trace (A) Negative/Trace   Glucose, UA Negative Negative   Ketones, UA 1+ (A) Negative   RBC, UA Negative Negative   Bilirubin, UA Negative Negative   Urobilinogen, Ur 0.2 0.2 - 1.0 mg/dL   Nitrite, UA Negative Negative   Microscopic Examination Comment       Assessment & Plan:   Problem List Items Addressed This Visit       Cardiovascular and Mediastinum   Hypertension - Primary    Under good control on current regimen. Continue current regimen. Continue to monitor. Call with any concerns. Refills up to date. Labs drawn today.       Relevant Orders   Basic metabolic panel     Follow up plan: Return as scheduled.

## 2022-11-27 NOTE — Assessment & Plan Note (Signed)
Under good control on current regimen. Continue current regimen. Continue to monitor. Call with any concerns. Refills up to date. Labs drawn today.  

## 2022-11-28 ENCOUNTER — Encounter: Payer: Self-pay | Admitting: Family Medicine

## 2022-11-28 LAB — BASIC METABOLIC PANEL
BUN/Creatinine Ratio: 19 (ref 10–24)
BUN: 20 mg/dL (ref 8–27)
CO2: 24 mmol/L (ref 20–29)
Calcium: 9.1 mg/dL (ref 8.6–10.2)
Chloride: 104 mmol/L (ref 96–106)
Creatinine, Ser: 1.03 mg/dL (ref 0.76–1.27)
Glucose: 92 mg/dL (ref 70–99)
Potassium: 4.4 mmol/L (ref 3.5–5.2)
Sodium: 141 mmol/L (ref 134–144)
eGFR: 77 mL/min/{1.73_m2} (ref >59)

## 2022-12-04 ENCOUNTER — Ambulatory Visit: Payer: Self-pay

## 2022-12-04 NOTE — Telephone Encounter (Signed)
He should only be taking the amlodipine as needed for BP >160. If he's taking all 3 every day, he should stop that one. If his fall was from being tangled up, there is no need to change his medicine. If his fall was because of his blood pressure and he's not taking his amlodipine every day, we should see him

## 2022-12-04 NOTE — Telephone Encounter (Signed)
Summary: Fall due to being tangled up, hypotension   The patients spouse called in stating her husband fell this morning. He landed on his behind so he is ok but she was concerned because he got tangled up. He uses a wheelchair sometimes due to the fact he has Parkinson's . She states his blood pressure was 105/60 and he is on 3 different bp medicines including amLODipine (NORVASC) 2.5 MG tablet, lisinopril (ZESTRIL) 30 MG tablet and metoprolol succinate (TOPROL-XL) 25 MG 24 hr tablet. She said he saw his provider last week and she wanted him to stay with the medicines he is on but she wants to know if that should be changed now due to this fall and his low blood pressure reading. Please assist patient further.     Chief Complaint: Wife reports pt. Fell this morning. Fell on buttocks, no injury. BP 105/60. Wife concerned about his BP medicines "are too much and BP being too low with his Parkinson's." Symptoms: Above Frequency: Today Pertinent Negatives: Patient denies  Disposition: [] ED /[] Urgent Care (no appt availability in office) / [] Appointment(In office/virtual)/ []  [facility]/ [] Home Care/ [] Refused Recommended Disposition /[] [facility]/ [x]  Follow-up with PCP Additional Notes: Please advise wife, declines OV.  Reason for Disposition  [1] Caller has URGENT question AND [2] triager unable to answer question  Answer Assessment - Initial Assessment Questions 1. MECHANISM: "How did the fall happen?"     Tripped 2. DOMESTIC VIOLENCE AND ELDER ABUSE SCREENING: "Did you fall because someone pushed you or tried to hurt you?" If Yes, ask: "Are you safe now?"     No 3. ONSET: "When did the fall happen?" (e.g., minutes, hours, or days ago)     Today 4. LOCATION: "What part of the body hit the ground?" (e.g., back, buttocks, head, hips, knees, hands, head, stomach)     Buttocks 5. INJURY: "Did you hurt (injure) yourself when you fell?" If Yes, ask: "What did you injure?  Tell me more about this?" (e.g., body area; type of injury; pain severity)"     No 6. PAIN: "Is there any pain?" If Yes, ask: "How bad is the pain?" (e.g., Scale 1-10; or mild,  moderate, severe)   - NONE (0): No pain   - MILD (1-3): Doesn't interfere with normal activities    - MODERATE (4-7): Interferes with normal activities or awakens from sleep    - SEVERE (8-10): Excruciating pain, unable to do any normal activities      No 7. SIZE: For cuts, bruises, or swelling, ask: "How large is it?" (e.g., inches or centimeters)      No 8. PREGNANCY: "Is there any chance you are pregnant?" "When was your last menstrual period?"     N/a 9. OTHER SYMPTOMS: "Do you have any other symptoms?" (e.g., dizziness, fever, weakness; new onset or worsening).      Low BP 105/60 10. CAUSE: "What do you think caused the fall (or falling)?" (e.g., tripped, dizzy spell)       Maybe low BP  Protocols used: Falls and [facility]

## 2022-12-04 NOTE — Telephone Encounter (Signed)
Called pt no answer left detailed message on vm of what provider stated asked for a return call to find out how patient is taking the medication.

## 2022-12-06 ENCOUNTER — Other Ambulatory Visit: Payer: Self-pay | Admitting: Neurology

## 2022-12-06 DIAGNOSIS — G20A1 Parkinson's disease without dyskinesia, without mention of fluctuations: Secondary | ICD-10-CM

## 2022-12-08 ENCOUNTER — Ambulatory Visit: Payer: Medicare HMO | Admitting: Urology

## 2022-12-08 VITALS — BP 128/82 | HR 58 | Ht 69.0 in | Wt 218.6 lb

## 2022-12-08 DIAGNOSIS — R32 Unspecified urinary incontinence: Secondary | ICD-10-CM

## 2022-12-08 LAB — URINALYSIS, COMPLETE
Bilirubin, UA: NEGATIVE
Glucose, UA: NEGATIVE
Leukocytes,UA: NEGATIVE
Nitrite, UA: NEGATIVE
Protein,UA: NEGATIVE
RBC, UA: NEGATIVE
Specific Gravity, UA: >1.03 — ABNORMAL HIGH (ref 1.005–1.030)
Urobilinogen, Ur: 0.2 mg/dL (ref 0.2–1.0)
pH, UA: 6 (ref 5.0–7.5)

## 2022-12-08 LAB — MICROSCOPIC EXAMINATION: Bacteria, UA: NONE SEEN

## 2022-12-08 LAB — BLADDER SCAN AMB NON-IMAGING: Scan Result: 0

## 2022-12-08 MED ORDER — SOLIFENACIN SUCCINATE 5 MG PO TABS: 5.0000 mg | ORAL_TABLET | Freq: Every day | ORAL | 11 refills | Status: DC

## 2022-12-08 NOTE — Progress Notes (Signed)
12/08/2022 3:22 PM   Thomas [name] 08/10/1950 [id number]  Referring provider: Dorcas Carrow, DO [address] [place],  [place] [address]  Chief Complaint  Patient presents with   Follow-up    HPI: I was consulted to assess the patient's urgency incontinence.  He had Parkinson's for approximately 13 years.  He voids every 60 to 90 minutes and gets up least 3 times a night and does not have ankle edema.  He has urge incontinence but no stress incontinence or bedwetting and does not wear a pad.  His flow is good   A few years ago he had low back surgery and 15 years ago he had a stroke.  He is prone to constipation.  He is on Flomax    Postvoid residual 0 mL  Patient has Parkinson's with frequency and urge incontinence.  I do not think he needs cystoscopy at this stage or urodynamics but may need 1 or both in the future.  I will see him back on Flomax in combination with Myrbetriq 25 mg samples and a prescription and keep an eye on his postvoid urine volume.  Rare risk of retention discussed.  I did mention that further testing may be needed in the future depending on how he does with medical therapy   Today Send on Flomax twice a day.  Oxybutynin helped well at first but now 50% improved.  Still leaks some.  Myrbetriq did not help.  He is prone to constipation.   PMH: Past Medical History:  Diagnosis Date   Activated protein C resistance 06/28/2019   Arthritis of knee    Arthropathy of lumbar facet joint    Atrial fibrillation    BPH (benign prostatic hyperplasia) 09/10/2018   DVT (deep venous thrombosis)    L leg   Dysplastic nevus 05/15/2021   Left Superior Pectoral, mod to severe, Excised 07/16/21   Erectile dysfunction 03/01/2015   Herniated lumbar disc without myelopathy 10/11/2020   Hyperchloremia    Hypercholesterolemia    Hypertension    Inability to walk 02/22/2021   Insomnia due to medical condition 09/30/2018   Major depressive disorder 01/22/2022   Mild  neurocognitive disorder due to Parkinson's disease 11/29/2018   Nephrolithiasis    Osteoarthritis    Parkinson's disease    Plantar wart    Pneumonia    Post-operative nausea and vomiting 09/20/2020   Pulmonary embolism    Sciatica    Seasonal allergies    TIA (transient ischemic attack)     Surgical History: Past Surgical History:  Procedure Laterality Date   APPENDECTOMY     BOWEL DECOMPRESSION  10/15/2022   Procedure: BOWEL DECOMPRESSION;  Surgeon: Jaynie Collins, DO;  Location: [facility] ENDOSCOPY;  Service: Gastroenterology;;   CATARACT EXTRACTION Bilateral 10/2019 11/2019   COLON RESECTION SIGMOID N/A 10/17/2022   Procedure: COLON RESECTION SIGMOID;  Surgeon: Henrene Dodge, MD;  Location: [facility] ORS;  Service: General;  Laterality: N/A;   COLONOSCOPY WITH PROPOFOL N/A 07/27/2019   Procedure: COLONOSCOPY WITH PROPOFOL;  Surgeon: Toney Reil, MD;  Location: [facility] ENDOSCOPY;  Service: Endoscopy;  Laterality: N/A;   EYE SURGERY     FLEXIBLE SIGMOIDOSCOPY N/A 10/15/2022   Procedure: FLEXIBLE SIGMOIDOSCOPY;  Surgeon: Jaynie Collins, DO;  Location[facility] ENDOSCOPY;  Service: Gastroenterology;  Laterality: N/A;   KNEE ARTHROSCOPY WITH MENISCAL REPAIR Right 2009   KNEE SURGERY Right    LUMBAR LAMINECTOMY/DECOMPRESSION MICRODISCECTOMY Left 10/11/2020   Procedure: Left Lumbar Three-Four, Lumbar Fou-Five  Microdiscectomy;  Surgeon: Maeola Harman, MD;  [facility];  Service: Neurosurgery;  Laterality: Left;   melanoma removal Left 07/16/2021   chest   T&A     TONSILLECTOMY      Home Medications:  Allergies as of 12/08/2022       Reactions   Codeine Other (See Comments)   Hallucinations   Other Nausea Only   General anesthesia         Medication List        Accurate as of December 08, 2022  3:22 PM. If you have any questions, ask your nurse or doctor.          acetaminophen 500 MG tablet Commonly known as: TYLENOL Take 1,000 mg by mouth daily.   amantadine  100 MG capsule Commonly known as: SYMMETREL TAKE 1 CAPSULE BY MOUTH 3 TIMES A DAY   amLODipine 2.5 MG tablet Commonly known as: NORVASC TAKE 1 TABLET (2.5 MG TOTAL) BY MOUTH DAILY AS NEEDED (BLOOD PRESSURE GREATER THAN 160/90). TAKE IN THE MORNING What changed:  how much to take how to take this when to take this   calcium carbonate 500 MG chewable tablet Commonly known as: TUMS - dosed in mg elemental calcium Chew 1 tablet by mouth daily as needed for indigestion or heartburn.   carbidopa-levodopa 25-100 MG tablet Commonly known as: SINEMET IR TAKE TWO TABLETS BY MOUTH FOUR TIMES A DAY AT 6AM, 10AM, 2PM. AND 6PM. What changed:  how much to take when to take this   carbidopa-levodopa 50-200 MG tablet Commonly known as: SINEMET CR TAKE 1 TABLET BY MOUTH AT BEDTIME What changed: Another medication with the same name was changed. Make sure you understand how and when to take each.   cetirizine 10 MG tablet Commonly known as: ZYRTEC Take 10 mg by mouth at bedtime.   cholecalciferol 25 MCG (1000 UNIT) tablet Commonly known as: VITAMIN D3 Take 1,000 Units by mouth daily.   diclofenac Sodium 1 % Gel Commonly known as: VOLTAREN APPLY TOPICALLY 4 GRAMS FOUR TIMES DAILY What changed: See the new instructions.   hydrALAZINE 25 MG tablet Commonly known as: APRESOLINE Take 1 tablet (25 mg total) by mouth 3 (three) times daily as needed (for SBP >160). Takes in the evening   HYDROcodone-acetaminophen 5-325 MG tablet Commonly known as: NORCO/VICODIN Take 1 tablet by mouth 2 (two) times daily.   Inbrija 42 MG Caps Generic drug: Levodopa Samples of this drug were given to the patient, quantity 1 box of 32 capsules, Lot Number [id number] Exp: 03/2023  Patient has been educated on dosage and uses of medication.   Inbrija 42 MG Caps Generic drug: Levodopa Samples of this drug were given to the patient, quantity 1, Lot Number [id number] Exp 12-24   lisinopril 30 MG  tablet Commonly known as: ZESTRIL Take 1 tablet (30 mg total) by mouth daily.   melatonin 5 MG Tabs Take 5 mg by mouth at bedtime.   methocarbamol 500 MG tablet Commonly known as: ROBAXIN Take 1 tablet (500 mg total) by mouth every 6 (six) hours as needed for muscle spasms.   metoprolol succinate 25 MG 24 hr tablet Commonly known as: TOPROL-XL Take 1 tablet (25 mg total) by mouth at bedtime.   multivitamin tablet Take 1 tablet by mouth daily at 6 PM.   oxybutynin 10 MG 24 hr tablet Commonly known as: DITROPAN-XL Take 1 tablet (10 mg total) by mouth daily.   polyethylene glycol powder 17 GM/SCOOP powder Commonly known as:  GLYCOLAX/MIRALAX Take 17 g by mouth at bedtime.   PreviDent 5000 Booster Plus 1.1 % Pste Generic drug: Sodium Fluoride Place 1 application onto teeth 2 (two) times daily.   rivaroxaban 20 MG Tabs tablet Commonly known as: Xarelto Take 1 tablet (20 mg total) by mouth daily.   rOPINIRole 3 MG tablet Commonly known as: REQUIP TAKE 1 TABLET BY MOUTH 3 TIMES A DAY   simvastatin 40 MG tablet Commonly known as: ZOCOR Take 1 tablet (40 mg total) by mouth daily.   Systane Ultra 0.4-0.3 % Soln Generic drug: Polyethyl Glycol-Propyl Glycol Place 1 drop into both eyes daily as needed (DRY EYES).   tamsulosin 0.4 MG Caps capsule Commonly known as: FLOMAX Take 2 capsules (0.8 mg total) by mouth daily.   traZODone 50 MG tablet Commonly known as: DESYREL Take 1 tablet (50 mg total) by mouth at bedtime.        Allergies:  Allergies  Allergen Reactions   Codeine Other (See Comments)    Hallucinations   Other Nausea Only    General anesthesia     Family History: Family History  Problem Relation Age of Onset   Parkinson's disease Father    Coronary artery disease Mother    Heart attack Sister    Deep vein thrombosis Sister     Social History:  reports that he has never smoked. He has never been exposed to tobacco smoke. He has never used  smokeless tobacco. He reports that he does not drink alcohol and does not use drugs.  ROS:                                        Physical Exam: BP 128/82   Pulse (!) 58   Ht 5\' 9"  (1.753 m)   Wt 99.2 kg   BMI 32.28 kg/m   Constitutional:  Alert and oriented, No acute distress. HEENT: [place] AT, moist mucus membranes.  Trachea midline, no masses.   Laboratory Data: Lab Results  Component Value Date   WBC 5.7 11/14/2022   HGB 12.9 (L) 11/14/2022   HCT 38.6 11/14/2022   MCV 92 11/14/2022   PLT 197 11/14/2022    Lab Results  Component Value Date   CREATININE 1.03 11/27/2022    No results found for: "PSA"  No results found for: "TESTOSTERONE"  Lab Results  Component Value Date   HGBA1C 5.4 09/18/2020    Urinalysis    Component Value Date/Time   COLORURINE YELLOW (A) 10/15/2022 1107   APPEARANCEUR Clear 11/14/2022 1630   LABSPEC 1.009 10/15/2022 1107   PHURINE 7.0 10/15/2022 1107   GLUCOSEU Negative 11/14/2022 1630   HGBUR NEGATIVE 10/15/2022 1107   BILIRUBINUR Negative 11/14/2022 1630   KETONESUR NEGATIVE 10/15/2022 1107   PROTEINUR Trace (A) 11/14/2022 1630   PROTEINUR NEGATIVE 10/15/2022 1107   NITRITE Negative 11/14/2022 1630   NITRITE NEGATIVE 10/15/2022 1107   LEUKOCYTESUR Negative 11/14/2022 1630   LEUKOCYTESUR NEGATIVE 10/15/2022 1107    Pertinent Imaging:   Assessment & Plan: Patient will try Vesicare.  Stay on Flomax.  Check residual next visit.  We can always go back on oxybutynin.  We talked about the pros and cons of an antimuscarinic and parkinsonism patients.  I will discuss percutaneous tibial nerve stimulation next visit.  Appears that he is never tried [place]  1. Urinary incontinence, unspecified type  - Urinalysis, Complete - BLADDER SCAN  AMB NON-IMAGING   No follow-ups on file.  Martina Sinner, MD  [facility] [address] [place], [place] [address] [phone]

## 2022-12-09 DIAGNOSIS — F4542 Pain disorder with related psychological factors: Secondary | ICD-10-CM | POA: Diagnosis not present

## 2022-12-22 ENCOUNTER — Other Ambulatory Visit: Payer: Self-pay

## 2022-12-22 ENCOUNTER — Telehealth: Payer: Self-pay

## 2022-12-22 MED ORDER — TROSPIUM CHLORIDE 20 MG PO TABS: 60.0000 mg | ORAL_TABLET | Freq: Two times a day (BID) | ORAL | 11 refills | Status: DC

## 2022-12-22 NOTE — Telephone Encounter (Addendum)
Pt's wife called triage, stating vesicare is not helping pt. Pt is back on oxybutynin. Pt's wife asked if there is anything stronger the pt can take. Please advise

## 2022-12-23 ENCOUNTER — Other Ambulatory Visit: Payer: Self-pay

## 2022-12-23 MED ORDER — TROSPIUM CHLORIDE ER 60 MG PO CP24: 60.0000 mg | ORAL_CAPSULE | Freq: Every day | ORAL | 11 refills | Status: DC

## 2023-01-01 ENCOUNTER — Telehealth: Payer: Self-pay | Admitting: Family Medicine

## 2023-01-01 NOTE — Telephone Encounter (Signed)
Pt's wife is calling back to follow up on the medical clearance and approval to stop the xarelto be approved. Pts wife states the Dr. Kathlene Cote office has faxed the paper work 4 times with no response. Pt's wife reports that the surgery is scheduled for next week. And does not know if the office is closed tomorrow on Friday. Requesting a response today.

## 2023-01-01 NOTE — Telephone Encounter (Signed)
Copied from CRM [id number]. Topic: General - Other >> Jan 01, 2023  8:50 AM Franchot Heidelberg wrote: Reason for CRM: Pt's wife called reporting that a form has been faxed to the office for medical clearance, patient is scheduled next week for a procedure. Pt's wife wants to relay pertinent information and is requesting a call back to confirm completion.   AttnMarena Chancy  Fax: [phone]  They need clearance form or wording that says Tammy Sours is released and can go off of his Xarelto for 3-5 days.  Pt's wife states that this form has been submitted to the office 4 times  Best contact: [phone] Enrique Sack at Dr. Lorrine Kin with [facility])

## 2023-01-02 NOTE — Telephone Encounter (Signed)
Lauren calling from Dr. Louellen Molder office is calling requesting paperwork before 12 noon today when their office closes.  [phone] Fax-[phone]  Procedure is scheduled for next week and if the paperwork is not received today the procedure would been to be rescheduled. Please advise

## 2023-01-02 NOTE — Telephone Encounter (Signed)
Paperwork has been faxed back

## 2023-01-02 NOTE — Telephone Encounter (Signed)
Paperwork will not be faxed before noon. I can hopefully get it to them by 1 but I am seeing patients until noon

## 2023-01-07 DIAGNOSIS — M5416 Radiculopathy, lumbar region: Secondary | ICD-10-CM | POA: Diagnosis not present

## 2023-01-07 DIAGNOSIS — Z9889 Other specified postprocedural states: Secondary | ICD-10-CM | POA: Diagnosis not present

## 2023-01-07 DIAGNOSIS — G894 Chronic pain syndrome: Secondary | ICD-10-CM | POA: Diagnosis not present

## 2023-01-10 ENCOUNTER — Other Ambulatory Visit: Payer: Self-pay | Admitting: Neurology

## 2023-01-10 DIAGNOSIS — G20A1 Parkinson's disease without dyskinesia, without mention of fluctuations: Secondary | ICD-10-CM

## 2023-01-15 ENCOUNTER — Other Ambulatory Visit: Payer: Self-pay | Admitting: Neurology

## 2023-01-15 DIAGNOSIS — G20A1 Parkinson's disease without dyskinesia, without mention of fluctuations: Secondary | ICD-10-CM

## 2023-01-15 DIAGNOSIS — G20B1 Parkinson's disease with dyskinesia, without mention of fluctuations: Secondary | ICD-10-CM

## 2023-01-23 ENCOUNTER — Other Ambulatory Visit: Payer: Self-pay | Admitting: Family Medicine

## 2023-01-23 NOTE — Telephone Encounter (Signed)
Requested Prescriptions  Pending Prescriptions Disp Refills   tamsulosin (FLOMAX) 0.4 MG CAPS capsule [Pharmacy Med Name: TAMSULOSIN HCL 0.4 MG CAPSULE] 180 capsule 0    Sig: TAKE 2 CAPSULES BY MOUTH DAILY     Urology: Alpha-Adrenergic Blocker Passed - 01/23/2023  6:23 AM      Passed - PSA in normal range and within 360 days    Prostate Specific Ag, Serum  Date Value Ref Range Status  07/28/2022 1.9 0.0 - 4.0 ng/mL Final    Comment:    Roche ECLIA methodology. According to the American Urological Association, Serum PSA should decrease and remain at undetectable levels after radical prostatectomy. The AUA defines biochemical recurrence as an initial PSA value 0.2 ng/mL or greater followed by a subsequent confirmatory PSA value 0.2 ng/mL or greater. Values obtained with different assay methods or kits cannot be used interchangeably. Results cannot be interpreted as absolute evidence of the presence or absence of malignant disease.          Passed - Last BP in normal range    BP Readings from Last 1 Encounters:  12/08/22 128/82         Passed - Valid encounter within last 12 months    Recent Outpatient Visits           1 month ago Primary hypertension   [place] [facility] [place], Megan P, DO   2 months ago Idiopathic hypotension   [place] [facility] Pearley, Sherran Needs, NP   2 months ago Hospital discharge follow-up   [place] [facility] Mecum, Oswaldo Conroy, PA-C   5 months ago Routine general medical examination at a health care facility   [facility] [place], [place] P, DO   1 year ago Need for influenza vaccination   [place] [facility] Dorcas Carrow, DO       Future Appointments             In 3 days MacDiarmid, Lorin Picket, MD [facility]   In 2 weeks Dorcas Carrow, DO [place] [facility]   In 3 months Deirdre Evener, MD [facility]             amLODipine (NORVASC) 2.5 MG tablet [Pharmacy Med Name: AMLODIPINE BESYLATE 2.5 MG TAB] 90 tablet 0    Sig: TAKE 1 TABLET BY MOUTH EVERY MORNING AS NEEDED  (FOR BLOOD PRESSURE GREATER THAN 160/90)     Cardiovascular: Calcium Channel Blockers 2 Passed - 01/23/2023  6:23 AM      Passed - Last BP in normal range    BP Readings from Last 1 Encounters:  12/08/22 128/82         Passed - Last Heart Rate in normal range    Pulse Readings from Last 1 Encounters:  12/08/22 (!) 58         Passed - Valid encounter within last 6 months    Recent Outpatient Visits           1 month ago Primary hypertension   [place] [facility] [place], [place], DO   2 months ago Idiopathic hypotension   [place] [facility] Pearley, Sherran Needs, NP   2 months ago Hospital discharge follow-up    [facility] Mecum, Oswaldo Conroy, PA-C   5 months ago Routine general medical examination at a health care facility  [facility] [place] P, DO   1 year ago Need for influenza vaccination   [facility] Dorcas Carrow, DO       Future Appointments             In 3 days MacDiarmid, Lorin Picket, MD [facility]   In 2 weeks Dorcas Carrow, DO [place] [facility]   In 3 months Deirdre Evener, MD [facility]             metoprolol succinate (TOPROL-XL) 25 MG 24 hr tablet [Pharmacy Med Name: METOPROLOL SUCC ER 25 MG TAB] 90 tablet 0    Sig: TAKE 1 TABLET BY MOUTH AT BEDTIME     Cardiovascular:  Beta Blockers Passed - 01/23/2023  6:23 AM      Passed - Last BP in normal range    BP Readings from Last 1 Encounters:  12/08/22 128/82         Passed - Last Heart Rate in normal range    Pulse Readings from Last 1 Encounters:  12/08/22 (!) 58         Passed - Valid encounter within last 6 months     Recent Outpatient Visits           1 month ago Primary hypertension   [facility] [place], [place], DO   2 months ago Idiopathic hypotension   [place] [facility] Pearley, Sherran Needs, NP   2 months ago Hospital discharge follow-up   [place] Mecum, Oswaldo Conroy, PA-C   5 months ago Routine general medical examination at a health care facility   [facility] [place], [place] P, DO   1 year ago Need for influenza vaccination   [place] [facility] Dorcas Carrow, DO       Future Appointments             In 3 days MacDiarmid, Lorin Picket, MD [facility] [place]   In 2 weeks Dorcas Carrow, DO [facility]   In 3 months Deirdre Evener, MD [facility]

## 2023-01-26 ENCOUNTER — Ambulatory Visit (INDEPENDENT_AMBULATORY_CARE_PROVIDER_SITE_OTHER): Payer: Medicare HMO

## 2023-01-26 ENCOUNTER — Ambulatory Visit: Payer: Medicare HMO | Admitting: Urology

## 2023-01-26 ENCOUNTER — Telehealth: Payer: Self-pay

## 2023-01-26 ENCOUNTER — Encounter: Payer: Self-pay | Admitting: Urology

## 2023-01-26 VITALS — BP 128/82 | HR 65 | Ht 69.5 in | Wt 219.0 lb

## 2023-01-26 DIAGNOSIS — N3941 Urge incontinence: Secondary | ICD-10-CM

## 2023-01-26 DIAGNOSIS — Z23 Encounter for immunization: Secondary | ICD-10-CM

## 2023-01-26 LAB — URINALYSIS, COMPLETE
Bilirubin, UA: NEGATIVE
Glucose, UA: NEGATIVE
Leukocytes,UA: NEGATIVE
Nitrite, UA: NEGATIVE
Protein,UA: NEGATIVE
RBC, UA: NEGATIVE
Specific Gravity, UA: >1.03 — ABNORMAL HIGH (ref 1.005–1.030)
Urobilinogen, Ur: 0.2 mg/dL (ref 0.2–1.0)
pH, UA: 6 (ref 5.0–7.5)

## 2023-01-26 LAB — MICROSCOPIC EXAMINATION

## 2023-01-26 MED ORDER — OXYBUTYNIN CHLORIDE ER 10 MG PO TB24: 10.0000 mg | ORAL_TABLET | Freq: Every day | ORAL | 11 refills | Status: DC

## 2023-01-26 NOTE — Patient Instructions (Signed)
Urgent PC office-based treatment for overactive bladder Take back control of your life! Urgent PC is a non-drug, non-surgical option for overactive bladder and associated symptoms of urinary urgency, urinary frequency and urge incontinence  Urgent-PC- Since 2003, healthcare professionals have used the Urgent PC Neuromodulation System as an effective office treatment for men and women suffering from overactive bladder, a condition commonly referred to as OAB. Urgent PC is up to 80% effective, even after conservative measures and OAB drugs have failed. Plus, Urgent PC is very low risk, making it a great choice for people unable or unwilling to have more invasive procedures.  How does Urgent PC work?  The Urgent PC system delivers a specific type of neuromodulation called percutaneous tibial nerve stimulation (PTNS). During treatment, a small, slim needle electrode is inserted near your ankle. The needle electrode is then connected to the battery-powered stimulator. During your 30-minute treatment, mild impulses from the stimulator travel through the needle electrode, along your leg and to the nerves in your pelvis that control bladder function. This process is also referred to as neuromodulation.  What will I feel with Urgent PC therapy?  Because patients may experience the sensation of the Urgent PC therapy in different ways, it's difficult to say what the treatment would feel like to you. Patients often describe the sensation as "tingling" or "pulsating." Treatment is typically well-tolerated by patients. Urgent PC offers many different levels of stimulation, so your clinician will be able to adjust treatment to suit you as well as address any discomfort that you might experience during treatment.  How often will I need Urgent PC treatments? You will receive an initial series of 12 treatments scheduled about a week apart. If you respond, you will likely need a treatment about once per month to  maintain your improvements.  How soon will I see results with Urgent PC? Because Urgent PC gently modifies the signals to achieve bladder control, it usually takes 5-7 weeks for symptoms to change. However, patients respond at different rates. In a review of about 100 patients who had success with Urgent PC, symptoms improved anywhere between 2-12 weeks. For about 20% of these patients, the symptoms of urgency and/or urge incontinence didn't improve until after 8 weeks.1  There is no way to anticipate who will respond earlier, later or not at all. That's why it is important to receive the 12 recommended treatments before you and your physician evaluate whether this therapy is an appropriate and effective choice for you.  How can I receive treatment with Urgent PC? Urgent PC is an option for patients with OAB. If you think you have OAB, talk to your doctor, a urologist or urogynecologist. If you have OAB, the doctor will work with you to determine your own personal treatment plan which usually starts with behavior and diet modifications plus medications. Urgent PC is an excellent option if these options don't work or provide sufficient improvements. Treatment with Urgent PC is typically performed at the office of a urologist, urogynecologist or gynecologist. So, if you run out of options with your normal doctor, consider visiting one of these specialists.  Does insurance cover Urgent PC? Urgent PC treatment is reimbursed by Medicare across the [place]. Private insurance coverage varies by state. To see if your insurance company covers Urgent PC, use our Coverage Finder or talk to your Healthcare Provider.  Are there patients who should not be treated with Urgent PC? Yes, these include: patients with pacemakers or implantable defibrillators, patients prone  to excessive bleeding, patients with nerve damage that could impact either percutaneous tibial nerve or pelvic floor function and patients who  are pregnant or planning to become pregnant during the duration of the treatment.  What are the risks associated with Urgent PC? The risks associated with Urgent PC therapy are low. Most common side-effects are temporary and include mild pain or skin inflammation at or near the stimulation site.

## 2023-01-26 NOTE — Telephone Encounter (Signed)
Dr. Sherron Monday would like patient to look into PTNS. P submitted via cohere website, pending decision, tracking #[id number]

## 2023-01-26 NOTE — Progress Notes (Signed)
01/26/2023 3:13 PM   [name] 1950-08-25 [id number]  Referring provider: Dorcas Carrow, DO [address] [place],  [place] [address]  Chief Complaint  Patient presents with   Follow-up   Urinary Incontinence    HPI: I was consulted to assess the patient's urgency incontinence.  He had Parkinson's for approximately 13 years.  He voids every 60 to 90 minutes and gets up least 3 times a night and does not have ankle edema.  He has urge incontinence but no stress incontinence or bedwetting and does not wear a pad.  His flow is good   A few years ago he had low back surgery and 15 years ago he had a stroke.  He is prone to constipation.  He is on Flomax     Postvoid residual 0 mL   Patient has Parkinson's with frequency and urge incontinence.  I do not think he needs cystoscopy at this stage or urodynamics but may need 1 or both in the future.  I will see him back on Flomax in combination with Myrbetriq 25 mg samples and a prescription and keep an eye on his postvoid urine volume.  Rare risk of retention discussed.  I did mention that further testing may be needed in the future depending on how he does with medical therapy    Send on Flomax twice a day.  Oxybutynin helped well at first but now 50% improved.  Still leaks some.  Myrbetriq did not help.  He is prone to constipation.    patient will try Vesicare.  Stay on Flomax.  Check residual next visit.  We can always go back on oxybutynin.  We talked about the pros and cons of an antimuscarinic and parkinsonism patients.  I will discuss percutaneous tibial nerve stimulation next visit.  Appears that he is never tried [place]   Today Patient actually tried trospium.  It did not help.  Still has urgency and frequency but is not incontinent according to him today.  I think from time to time he does have urge incontinence  clinically not infected   PMH: Past Medical History:  Diagnosis Date   Activated protein C resistance 06/28/2019    Arthritis of knee    Arthropathy of lumbar facet joint    Atrial fibrillation    BPH (benign prostatic hyperplasia) 09/10/2018   DVT (deep venous thrombosis)    L leg   Dysplastic nevus 05/15/2021   Left Superior Pectoral, mod to severe, Excised 07/16/21   Erectile dysfunction 03/01/2015   Herniated lumbar disc without myelopathy 10/11/2020   Hyperchloremia    Hypercholesterolemia    Hypertension    Inability to walk 02/22/2021   Insomnia due to medical condition 09/30/2018   Major depressive disorder 01/22/2022   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   Nephrolithiasis    Osteoarthritis    Parkinson's disease (HCC)    Plantar wart    Pneumonia    Post-operative nausea and vomiting 09/20/2020   Pulmonary embolism    Sciatica    Seasonal allergies    TIA (transient ischemic attack)     Surgical History: Past Surgical History:  Procedure Laterality Date   APPENDECTOMY     BOWEL DECOMPRESSION  10/15/2022   Procedure: BOWEL DECOMPRESSION;  Surgeon: Jaynie Collins, DO;  Location: [facility] ENDOSCOPY;  Service: Gastroenterology;;   CATARACT EXTRACTION Bilateral 10/2019 11/2019   COLON RESECTION SIGMOID N/A 10/17/2022   Procedure: COLON RESECTION SIGMOID;  Surgeon: Aleen Campi,  Elita Quick, MD;  Location: [facility] ORS;  Service: General;  Laterality: N/A;   COLONOSCOPY WITH PROPOFOL N/A 07/27/2019   Procedure: COLONOSCOPY WITH PROPOFOL;  Surgeon: Toney Reil, MD;  Location: [facility] ENDOSCOPY;  Service: Endoscopy;  Laterality: N/A;   EYE SURGERY     FLEXIBLE SIGMOIDOSCOPY N/A 10/15/2022   Procedure: FLEXIBLE SIGMOIDOSCOPY;  Surgeon: Jaynie Collins, DO;  Location: [facility] ENDOSCOPY;  Service: Gastroenterology;  Laterality: N/A;   KNEE ARTHROSCOPY WITH MENISCAL REPAIR Right 2009   KNEE SURGERY Right    LUMBAR LAMINECTOMY/DECOMPRESSION MICRODISCECTOMY Left 10/11/2020   Procedure: Left Lumbar Three-Four, Lumbar Fou-Five  Microdiscectomy;  Surgeon: Maeola Harman, MD;  Location: [facility] OR;  Service: Neurosurgery;  Laterality: Left;   melanoma removal Left 07/16/2021   chest   T&A     TONSILLECTOMY      Home Medications:  Allergies as of 01/26/2023       Reactions   Codeine Other (See Comments)   Hallucinations   Other Nausea Only   General anesthesia         Medication List        Accurate as of January 26, 2023  3:13 PM. If you have any questions, ask your nurse or doctor.          acetaminophen 500 MG tablet Commonly known as: TYLENOL Take 1,000 mg by mouth daily.   amantadine 100 MG capsule Commonly known as: SYMMETREL TAKE 1 CAPSULE BY MOUTH 3 TIMES A DAY   amLODipine 2.5 MG tablet Commonly known as: NORVASC TAKE 1 TABLET BY MOUTH EVERY MORNING AS NEEDED  (FOR BLOOD PRESSURE GREATER THAN 160/90)   calcium carbonate 500 MG chewable tablet Commonly known as: TUMS - dosed in mg elemental calcium Chew 1 tablet by mouth daily as needed for indigestion or heartburn.   carbidopa-levodopa 25-100 MG tablet Commonly known as: SINEMET IR TAKE TWO TABLETS BY MOUTH FOUR TIMES A DAY AT 6AM, 10AM, 2PM., AND 6PM   carbidopa-levodopa 50-200 MG tablet Commonly known as: SINEMET CR TAKE 1 TABLET BY MOUTH AT BEDTIME   cetirizine 10 MG tablet Commonly known as: ZYRTEC Take 10 mg by mouth at bedtime.   cholecalciferol 25 MCG (1000 UNIT) tablet Commonly known as: VITAMIN D3 Take 1,000 Units by mouth daily.   diclofenac Sodium 1 % Gel Commonly known as: VOLTAREN APPLY TOPICALLY 4 GRAMS FOUR TIMES DAILY   hydrALAZINE 25 MG tablet Commonly known as: APRESOLINE Take 1 tablet (25 mg total) by mouth 3 (three) times daily as needed (for SBP >160). Takes in the evening   HYDROcodone-acetaminophen 5-325 MG tablet Commonly known as: NORCO/VICODIN Take 1 tablet by mouth 2 (two) times daily.   Inbrija 42 MG Caps Generic drug: Levodopa Samples of this drug were given to the patient, quantity 1 box of 32 capsules, Lot Number  [id number] Exp: 03/2023  Patient has been educated on dosage and uses of medication.   Inbrija 42 MG Caps Generic drug: Levodopa Samples of this drug were given to the patient, quantity 1, Lot Number [id number] Exp 12-24   lisinopril 30 MG tablet Commonly known as: ZESTRIL Take 1 tablet (30 mg total) by mouth daily.   melatonin 5 MG Tabs Take 5 mg by mouth at bedtime.   methocarbamol 500 MG tablet Commonly known as: ROBAXIN Take 1 tablet (500 mg total) by mouth every 6 (six) hours as needed for muscle spasms.   metoprolol succinate 25 MG 24 hr tablet Commonly known as: TOPROL-XL TAKE 1 TABLET BY  MOUTH AT BEDTIME   multivitamin tablet Take 1 tablet by mouth daily at 6 PM.   polyethylene glycol powder 17 GM/SCOOP powder Commonly known as: GLYCOLAX/MIRALAX Take 17 g by mouth at bedtime.   PreviDent 5000 Booster Plus 1.1 % Pste Generic drug: Sodium Fluoride Place 1 application onto teeth 2 (two) times daily.   rivaroxaban 20 MG Tabs tablet Commonly known as: Xarelto Take 1 tablet (20 mg total) by mouth daily.   rOPINIRole 3 MG tablet Commonly known as: REQUIP TAKE 1 TABLET BY MOUTH 3 TIMES A DAY   simvastatin 40 MG tablet Commonly known as: ZOCOR Take 1 tablet (40 mg total) by mouth daily.   solifenacin 5 MG tablet Commonly known as: VESICARE Take 1 tablet (5 mg total) by mouth daily.   Systane Ultra 0.4-0.3 % Soln Generic drug: Polyethyl Glycol-Propyl Glycol Place 1 drop into both eyes daily as needed (DRY EYES).   tamsulosin 0.4 MG Caps capsule Commonly known as: FLOMAX TAKE 2 CAPSULES BY MOUTH DAILY   traZODone 50 MG tablet Commonly known as: DESYREL Take 1 tablet (50 mg total) by mouth at bedtime.   Trospium Chloride 60 MG Cp24 Take 1 capsule (60 mg total) by mouth daily at 12 noon.        Allergies:  Allergies  Allergen Reactions   Codeine Other (See Comments)    Hallucinations   Other Nausea Only    General anesthesia     Family  History: Family History  Problem Relation Age of Onset   Parkinson's disease Father    Coronary artery disease Mother    Heart attack Sister    Deep vein thrombosis Sister     Social History:  reports that he has never smoked. He has never been exposed to tobacco smoke. He has never used smokeless tobacco. He reports that he does not drink alcohol and does not use drugs.  ROS:                                        Physical Exam: There were no vitals taken for this visit.  Constitutional:  Alert and oriented, No acute distress. HEENT: [place] AT, moist mucus membranes.  Trachea midline, no masses.   Laboratory Data: Lab Results  Component Value Date   WBC 5.7 11/14/2022   HGB 12.9 (L) 11/14/2022   HCT 38.6 11/14/2022   MCV 92 11/14/2022   PLT 197 11/14/2022    Lab Results  Component Value Date   CREATININE 1.03 11/27/2022    No results found for: "PSA"  No results found for: "TESTOSTERONE"  Lab Results  Component Value Date   HGBA1C 5.4 09/18/2020    Urinalysis    Component Value Date/Time   COLORURINE YELLOW (A) 10/15/2022 1107   APPEARANCEUR Clear 12/08/2022 1447   LABSPEC 1.009 10/15/2022 1107   PHURINE 7.0 10/15/2022 1107   GLUCOSEU Negative 12/08/2022 1447   HGBUR NEGATIVE 10/15/2022 1107   BILIRUBINUR Negative 12/08/2022 1447   KETONESUR NEGATIVE 10/15/2022 1107   PROTEINUR Negative 12/08/2022 1447   PROTEINUR NEGATIVE 10/15/2022 1107   NITRITE Negative 12/08/2022 1447   NITRITE NEGATIVE 10/15/2022 1107   LEUKOCYTESUR Negative 12/08/2022 1447   LEUKOCYTESUR NEGATIVE 10/15/2022 1107    Pertinent Imaging:   Assessment & Plan: Discuss percutaneous tibial nerve stimulation full template.  Handout given.  Co-pay will be checked.  Urine sent for culture.  He  would like to go back on oxybutynin ER 10 mg 30 x 11 is a partial responder and this was sent  1. Urgency incontinence  - Urinalysis, Complete   No follow-ups on  file.  Martina Sinner, MD  [facility] [address] [place], [place] [address] [phone]

## 2023-01-27 DIAGNOSIS — M5416 Radiculopathy, lumbar region: Secondary | ICD-10-CM | POA: Diagnosis not present

## 2023-01-27 DIAGNOSIS — M5126 Other intervertebral disc displacement, lumbar region: Secondary | ICD-10-CM | POA: Diagnosis not present

## 2023-01-27 DIAGNOSIS — M5136 Other intervertebral disc degeneration, lumbar region with discogenic back pain only: Secondary | ICD-10-CM | POA: Diagnosis not present

## 2023-01-27 DIAGNOSIS — M48062 Spinal stenosis, lumbar region with neurogenic claudication: Secondary | ICD-10-CM | POA: Diagnosis not present

## 2023-01-28 ENCOUNTER — Telehealth: Payer: Self-pay | Admitting: Urology

## 2023-01-28 NOTE — Telephone Encounter (Signed)
PA approved 01/2023-04/14/2023

## 2023-01-28 NOTE — Telephone Encounter (Signed)
Wife asking is xarelto will be a problem?  We are still working on [employer]

## 2023-01-28 NOTE — Telephone Encounter (Signed)
Patient's wife, Neysa Bonito, called the office today to report that the patient is currently on Zarelto and is having a procedure next Friday, 10/25 to have a back pain stimulator placed in his back.  She wants to know if these things will prevent him from proceeding with the PTNS therapy.   Please call her at [phone].

## 2023-01-29 NOTE — Telephone Encounter (Signed)
Left message to call back to discuss. CO pay $10 each visit x 12

## 2023-01-30 NOTE — Telephone Encounter (Signed)
Thomas Martinez, MD  You21 minutes ago (11:13 AM)    Okay to do percutaneous tibial nerve stimulation with his back stimulator.  Do not have to stop blood thinners for percutaneous tibial nerve stimulation

## 2023-01-31 ENCOUNTER — Other Ambulatory Visit: Payer: Self-pay | Admitting: Family Medicine

## 2023-02-02 ENCOUNTER — Ambulatory Visit: Payer: Medicare HMO | Admitting: Family Medicine

## 2023-02-02 NOTE — Telephone Encounter (Signed)
Requested Prescriptions  Pending Prescriptions Disp Refills   XARELTO 20 MG TABS tablet [Pharmacy Med Name: XARELTO 20 MG TABLET] 90 tablet 1    Sig: TAKE 1 TABLET BY MOUTH DAILY     Hematology: Anticoagulants - rivaroxaban Failed - 01/31/2023  6:53 AM      Failed - AST in normal range and within 360 days    AST  Date Value Ref Range Status  10/15/2022 14 (L) 15 - 41 U/L Final         Failed - HGB in normal range and within 360 days    Hemoglobin  Date Value Ref Range Status  11/14/2022 12.9 (L) 13.0 - 17.7 g/dL Final         Passed - ALT in normal range and within 360 days    ALT  Date Value Ref Range Status  10/15/2022 6 0 - 44 U/L Final         Passed - Cr in normal range and within 360 days    Creatinine, Ser  Date Value Ref Range Status  11/27/2022 1.03 0.76 - 1.27 mg/dL Final         Passed - HCT in normal range and within 360 days    Hematocrit  Date Value Ref Range Status  11/14/2022 38.6 37.5 - 51.0 % Final         Passed - PLT in normal range and within 360 days    Platelets  Date Value Ref Range Status  11/14/2022 197 150 - 450 x10E3/uL Final         Passed - eGFR is 15 or above and within 360 days    GFR calc Af Amer  Date Value Ref Range Status  12/27/2019 91 >59 mL/min/1.73 Final    Comment:    **[facility] currently reports eGFR in compliance with the current**   recommendations of the [employer]. [facility] will   update reporting as new guidelines are published from the NKF-ASN   Task force.    GFR, Estimated  Date Value Ref Range Status  10/18/2022 >60 >60 mL/min Final    Comment:    (NOTE) Calculated using the CKD-EPI Creatinine Equation (2021)    eGFR  Date Value Ref Range Status  11/27/2022 77 >59 mL/min/1.73 Final         Passed - Patient is not pregnant      Passed - Valid encounter within last 12 months    Recent Outpatient Visits           2 months ago Primary hypertension   [facility] [place], Megan P, DO   2 months ago Idiopathic hypotension   [place] [facility] Pear[place], Sherran Needs, NP   3 months ago Hospital discharge follow-up   [facility] M[place], Oswaldo Conroy, PA-C   6 months ago Routine general medical examination at a health care facility   [facility] [place], [place] P, DO   1 year ago Need for influenza vaccination   [facility] Dorcas Carrow, DO       Future Appointments             In 1 week Dorcas Carrow, DO  [facility]   In 3 months Deirdre Evener, MD [facility]

## 2023-02-03 NOTE — Telephone Encounter (Signed)
Thomas [name] advised, they would like to proceed. She is in the meeting right now and would like to call back and schedule.

## 2023-02-06 ENCOUNTER — Ambulatory Visit: Payer: Medicare HMO | Admitting: Family Medicine

## 2023-02-06 DIAGNOSIS — M961 Postlaminectomy syndrome, not elsewhere classified: Secondary | ICD-10-CM | POA: Diagnosis not present

## 2023-02-06 DIAGNOSIS — G894 Chronic pain syndrome: Secondary | ICD-10-CM | POA: Diagnosis not present

## 2023-02-06 DIAGNOSIS — M5380 Other specified dorsopathies, site unspecified: Secondary | ICD-10-CM | POA: Diagnosis not present

## 2023-02-12 ENCOUNTER — Ambulatory Visit (INDEPENDENT_AMBULATORY_CARE_PROVIDER_SITE_OTHER): Payer: Medicare HMO | Admitting: Family Medicine

## 2023-02-12 ENCOUNTER — Encounter: Payer: Self-pay | Admitting: Family Medicine

## 2023-02-12 VITALS — BP 138/84 | HR 51 | Ht 69.5 in | Wt 221.4 lb

## 2023-02-12 DIAGNOSIS — E7800 Pure hypercholesterolemia, unspecified: Secondary | ICD-10-CM | POA: Diagnosis not present

## 2023-02-12 DIAGNOSIS — R350 Frequency of micturition: Secondary | ICD-10-CM

## 2023-02-12 DIAGNOSIS — F3341 Major depressive disorder, recurrent, in partial remission: Secondary | ICD-10-CM

## 2023-02-12 DIAGNOSIS — N401 Enlarged prostate with lower urinary tract symptoms: Secondary | ICD-10-CM | POA: Diagnosis not present

## 2023-02-12 DIAGNOSIS — I10 Essential (primary) hypertension: Secondary | ICD-10-CM

## 2023-02-12 MED ORDER — RIVAROXABAN 20 MG PO TABS: 20.0000 mg | ORAL_TABLET | Freq: Every day | ORAL | 3 refills | Status: DC

## 2023-02-12 MED ORDER — METOPROLOL SUCCINATE ER 25 MG PO TB24: 25.0000 mg | ORAL_TABLET | Freq: Every day | ORAL | 1 refills | Status: DC

## 2023-02-12 MED ORDER — SIMVASTATIN 40 MG PO TABS: 40.0000 mg | ORAL_TABLET | Freq: Every day | ORAL | 2 refills | Status: DC

## 2023-02-12 MED ORDER — TRAZODONE HCL 50 MG PO TABS: 50.0000 mg | ORAL_TABLET | Freq: Every day | ORAL | 1 refills | Status: DC

## 2023-02-12 MED ORDER — AMLODIPINE BESYLATE 2.5 MG PO TABS: ORAL_TABLET | ORAL | 1 refills | Status: DC

## 2023-02-12 MED ORDER — LISINOPRIL 30 MG PO TABS: 30.0000 mg | ORAL_TABLET | Freq: Every day | ORAL | 1 refills | Status: DC

## 2023-02-12 MED ORDER — TAMSULOSIN HCL 0.4 MG PO CAPS: 0.8000 mg | ORAL_CAPSULE | Freq: Every day | ORAL | 1 refills | Status: DC

## 2023-02-12 NOTE — Progress Notes (Signed)
BP 138/84   Pulse (!) 51   Ht 5' 9.5" (1.765 m)   Wt 221 lb 6.4 oz (100.4 kg)   SpO2 99%   BMI 32.23 kg/m    Subjective:    Patient ID: Thomas [name], male    DOB: [birth year], 72 y.o.   MRN: [id number]  HPI: Thomas [name] is a 72 y.o. male  Chief Complaint  Patient presents with   Hypertension   Had spinal stimulator placed about a week ago. Has been pretty sore. Feeling like it's starting to get better.   HYPERTENSION / HYPERLIPIDEMIA Satisfied with current treatment? yes Duration of hypertension: chronic BP monitoring frequency: not checking BP medication side effects: no Past BP meds: hydralazine, metoprolol, amlodipine, lisinopril Duration of hyperlipidemia: chronic Cholesterol medication side effects: no Cholesterol supplements: none Past cholesterol medications: simvastatin Medication compliance: excellent compliance Aspirin: no Recent stressors: yes Recurrent headaches: no Visual changes: no Palpitations: no Dyspnea: no Chest pain: no Lower extremity edema: no Dizzy/lightheaded: no  DEPRESSION Mood status: controlled Satisfied with current treatment?: yes Symptom severity: mild  Duration of current treatment : chronic Side effects: no Medication compliance: excellent compliance Psychotherapy/counseling: no  Previous psychiatric medications: none Depressed mood: no Anxious mood: no Anhedonia: no Significant weight loss or gain: no Insomnia: no  Fatigue: yes Feelings of worthlessness or guilt: no Impaired concentration/indecisiveness: no Suicidal ideations: no Hopelessness: no Crying spells: no    11/14/2022    5:02 PM 10/27/2022    2:22 PM 07/28/2022   10:46 AM 07/21/2022   11:05 AM 01/22/2022   10:05 AM  Depression screen PHQ 2/9  Decreased Interest 0 0 1 0 1  Down, Depressed, Hopeless 0 0 1 0 0  PHQ - 2 Score 0 0 2 0 1  Altered sleeping 0 0 0 0 1  Tired, decreased energy 0 0 1 0 1  Change in appetite 0 0 0 0 0  Feeling bad or failure  about yourself  0 2 0 0 0  Trouble concentrating 3 3 3  0 1  Moving slowly or fidgety/restless 2 2 3  0 2  Suicidal thoughts 0 0 0 0 0  PHQ-9 Score 5 7 9  0 6  Difficult doing work/chores Somewhat difficult Somewhat difficult Somewhat difficult Not difficult at all Somewhat difficult    Relevant past medical, surgical, family and social history reviewed and updated as indicated. Interim medical history since our last visit reviewed. Allergies and medications reviewed and updated.  Review of Systems  Constitutional: Negative.   Respiratory: Negative.    Cardiovascular: Negative.   Gastrointestinal: Negative.   Musculoskeletal: Negative.   Neurological:  Positive for tremors and weakness. Negative for dizziness, seizures, syncope, facial asymmetry, speech difficulty, light-headedness, numbness and headaches.  Psychiatric/Behavioral: Negative.      Per HPI unless specifically indicated above     Objective:    BP 138/84   Pulse (!) 51   Ht 5' 9.5" (1.765 m)   Wt 221 lb 6.4 oz (100.4 kg)   SpO2 99%   BMI 32.23 kg/m   Wt Readings from Last 3 Encounters:  02/12/23 221 lb 6.4 oz (100.4 kg)  01/26/23 219 lb (99.3 kg)  12/08/22 218 lb 9.6 oz (99.2 kg)    Physical Exam Vitals and nursing note reviewed.  Constitutional:      General: He is not in acute distress.    Appearance: Normal appearance. He is not ill-appearing, toxic-appearing or diaphoretic.  HENT:     Head: Normocephalic and atraumatic.  Right Ear: External ear normal.     Left Ear: External ear normal.     Nose: Nose normal.     Mouth/Throat:     Mouth: Mucous membranes are moist.     Pharynx: Oropharynx is clear.  Eyes:     General: No scleral icterus.       Right eye: No discharge.        Left eye: No discharge.     Extraocular Movements: Extraocular movements intact.     Conjunctiva/sclera: Conjunctivae normal.     Pupils: Pupils are equal, round, and reactive to light.  Cardiovascular:     Rate and  Rhythm: Normal rate and regular rhythm.     Pulses: Normal pulses.     Heart sounds: Normal heart sounds. No murmur heard.    No friction rub. No gallop.  Pulmonary:     Effort: Pulmonary effort is normal. No respiratory distress.     Breath sounds: Normal breath sounds. No stridor. No wheezing, rhonchi or rales.  Chest:     Chest wall: No tenderness.  Musculoskeletal:        General: Normal range of motion.     Cervical back: Normal range of motion and neck supple.  Skin:    General: Skin is warm and dry.     Capillary Refill: Capillary refill takes less than 2 seconds.     Coloration: Skin is not jaundiced or pale.     Findings: No bruising, erythema, lesion or rash.  Neurological:     General: No focal deficit present.     Mental Status: He is alert and oriented to person, place, and time. Mental status is at baseline.  Psychiatric:        Mood and Affect: Mood normal.        Behavior: Behavior normal.        Thought Content: Thought content normal.        Judgment: Judgment normal.     Results for orders placed or performed in visit on 02/12/23  CBC with Differential/Platelet  Result Value Ref Range   WBC 3.9 3.4 - 10.8 x10E3/uL   RBC 4.12 (L) 4.14 - 5.80 x10E6/uL   Hemoglobin 12.3 (L) 13.0 - 17.7 g/dL   Hematocrit 40.3 47.4 - 51.0 %   MCV 94 79 - 97 fL   MCH 29.9 26.6 - 33.0 pg   MCHC 31.9 31.5 - 35.7 g/dL   RDW 25.9 56.3 - 87.5 %   Platelets 168 150 - 450 x10E3/uL   Neutrophils 63 Not Estab. %   Lymphs 24 Not Estab. %   Monocytes 8 Not Estab. %   Eos 4 Not Estab. %   Basos 1 Not Estab. %   Neutrophils Absolute 2.5 1.4 - 7.0 x10E3/uL   Lymphocytes Absolute 0.9 0.7 - 3.1 x10E3/uL   Monocytes Absolute 0.3 0.1 - 0.9 x10E3/uL   EOS (ABSOLUTE) 0.2 0.0 - 0.4 x10E3/uL   Basophils Absolute 0.0 0.0 - 0.2 x10E3/uL   Immature Granulocytes 0 Not Estab. %   Immature Grans (Abs) 0.0 0.0 - 0.1 x10E3/uL  Comprehensive metabolic panel  Result Value Ref Range   Glucose 94 70  - 99 mg/dL   BUN 16 8 - 27 mg/dL   Creatinine, Ser 6.43 0.76 - 1.27 mg/dL   eGFR 92 >32 RJ/JOA/4.16   BUN/Creatinine Ratio 19 10 - 24   Sodium 140 134 - 144 mmol/L   Potassium 4.2 3.5 - 5.2 mmol/L   Chloride 103  96 - 106 mmol/L   CO2 19 (L) 20 - 29 mmol/L   Calcium 9.1 8.6 - 10.2 mg/dL   Total Protein 6.8 6.0 - 8.5 g/dL   Albumin 4.1 3.8 - 4.8 g/dL   Globulin, Total 2.7 1.5 - 4.5 g/dL   Bilirubin Total 0.5 0.0 - 1.2 mg/dL   Alkaline Phosphatase 118 44 - 121 IU/L   AST 13 0 - 40 IU/L   ALT 11 0 - 44 IU/L  Lipid Panel w/o Chol/HDL Ratio  Result Value Ref Range   Cholesterol, Total 126 100 - 199 mg/dL   Triglycerides 62 0 - 149 mg/dL   HDL 45 >16 mg/dL   VLDL Cholesterol Cal 13 5 - 40 mg/dL   LDL Chol Calc (NIH) 68 0 - 99 mg/dL  PSA  Result Value Ref Range   Prostate Specific Ag, Serum 2.0 0.0 - 4.0 ng/mL      Assessment & Plan:   Problem List Items Addressed This Visit       Cardiovascular and Mediastinum   Hypertension - Primary    Under good control on current regimen. Continue current regimen. Continue to monitor. Call with any concerns. Refills given. Labs drawn today.       Relevant Medications   amLODipine (NORVASC) 2.5 MG tablet   lisinopril (ZESTRIL) 30 MG tablet   metoprolol succinate (TOPROL-XL) 25 MG 24 hr tablet   simvastatin (ZOCOR) 40 MG tablet   rivaroxaban (XARELTO) 20 MG TABS tablet   Other Relevant Orders   CBC with Differential/Platelet (Completed)   Comprehensive metabolic panel (Completed)     Genitourinary   BPH (benign prostatic hyperplasia)    Under good control on current regimen. Continue current regimen. Continue to monitor. Call with any concerns. Refills given. Labs drawn today.        Relevant Medications   tamsulosin (FLOMAX) 0.4 MG CAPS capsule   Other Relevant Orders   CBC with Differential/Platelet (Completed)   PSA (Completed)     Other   Hypercholesterolemia    Under good control on current regimen. Continue current  regimen. Continue to monitor. Call with any concerns. Refills given. Labs drawn today.       Relevant Medications   amLODipine (NORVASC) 2.5 MG tablet   lisinopril (ZESTRIL) 30 MG tablet   metoprolol succinate (TOPROL-XL) 25 MG 24 hr tablet   simvastatin (ZOCOR) 40 MG tablet   rivaroxaban (XARELTO) 20 MG TABS tablet   Other Relevant Orders   CBC with Differential/Platelet (Completed)   Comprehensive metabolic panel (Completed)   Lipid Panel w/o Chol/HDL Ratio (Completed)   Major depressive disorder    Doing well not on medicine. Continue current regimen. Continue to monitor. Call with any concerns.       Relevant Medications   traZODone (DESYREL) 50 MG tablet   Other Relevant Orders   CBC with Differential/Platelet (Completed)     Follow up plan: Return in about 6 months (around 08/12/2023) for physical.

## 2023-02-13 ENCOUNTER — Encounter: Payer: Self-pay | Admitting: Family Medicine

## 2023-02-13 LAB — COMPREHENSIVE METABOLIC PANEL
ALT: 11 [IU]/L (ref 0–44)
AST: 13 [IU]/L (ref 0–40)
Albumin: 4.1 g/dL (ref 3.8–4.8)
Alkaline Phosphatase: 118 [IU]/L (ref 44–121)
BUN/Creatinine Ratio: 19 (ref 10–24)
BUN: 16 mg/dL (ref 8–27)
Bilirubin Total: 0.5 mg/dL (ref 0.0–1.2)
CO2: 19 mmol/L — ABNORMAL LOW (ref 20–29)
Calcium: 9.1 mg/dL (ref 8.6–10.2)
Chloride: 103 mmol/L (ref 96–106)
Creatinine, Ser: 0.85 mg/dL (ref 0.76–1.27)
Globulin, Total: 2.7 g/dL (ref 1.5–4.5)
Glucose: 94 mg/dL (ref 70–99)
Potassium: 4.2 mmol/L (ref 3.5–5.2)
Sodium: 140 mmol/L (ref 134–144)
Total Protein: 6.8 g/dL (ref 6.0–8.5)
eGFR: 92 mL/min/{1.73_m2} (ref >59)

## 2023-02-13 LAB — CBC WITH DIFFERENTIAL/PLATELET
Basophils Absolute: 0 10*3/uL (ref 0.0–0.2)
Basos: 1 %
EOS (ABSOLUTE): 0.2 10*3/uL (ref 0.0–0.4)
Eos: 4 %
Hematocrit: 38.6 % (ref 37.5–51.0)
Hemoglobin: 12.3 g/dL — ABNORMAL LOW (ref 13.0–17.7)
Immature Grans (Abs): 0 10*3/uL (ref 0.0–0.1)
Immature Granulocytes: 0 %
Lymphocytes Absolute: 0.9 10*3/uL (ref 0.7–3.1)
Lymphs: 24 %
MCH: 29.9 pg (ref 26.6–33.0)
MCHC: 31.9 g/dL (ref 31.5–35.7)
MCV: 94 fL (ref 79–97)
Monocytes Absolute: 0.3 10*3/uL (ref 0.1–0.9)
Monocytes: 8 %
Neutrophils Absolute: 2.5 10*3/uL (ref 1.4–7.0)
Neutrophils: 63 %
Platelets: 168 10*3/uL (ref 150–450)
RBC: 4.12 x10E6/uL — ABNORMAL LOW (ref 4.14–5.80)
RDW: 11.9 % (ref 11.6–15.4)
WBC: 3.9 10*3/uL (ref 3.4–10.8)

## 2023-02-13 LAB — LIPID PANEL W/O CHOL/HDL RATIO
Cholesterol, Total: 126 mg/dL (ref 100–199)
HDL: 45 mg/dL (ref >39)
LDL Chol Calc (NIH): 68 mg/dL (ref 0–99)
Triglycerides: 62 mg/dL (ref 0–149)
VLDL Cholesterol Cal: 13 mg/dL (ref 5–40)

## 2023-02-13 LAB — PSA: Prostate Specific Ag, Serum: 2 ng/mL (ref 0.0–4.0)

## 2023-02-22 NOTE — Assessment & Plan Note (Signed)
Under good control on current regimen. Continue current regimen. Continue to monitor. Call with any concerns. Refills given. Labs drawn today.   

## 2023-02-22 NOTE — Assessment & Plan Note (Signed)
Doing well not on medicine. Continue current regimen. Continue to monitor. Call with any concerns.

## 2023-02-23 ENCOUNTER — Telehealth: Payer: Self-pay | Admitting: Urology

## 2023-02-23 NOTE — Telephone Encounter (Signed)
Pt's wife called to get PTNS appointments scheduled.  Please call after 1:30 or 2 today.

## 2023-02-23 NOTE — Telephone Encounter (Signed)
Spoke with Mrs Trimarco, due not been able to get all 12 PTNS appointmnets in for this year and with possible changes of insurance benefits for 2025 scheduled patient for PTNS starting in January 2025 Will call insurance 04/15/23 to make sure PA is still approved for this. I will then call Mrs Weisenberg back to let her know either way.

## 2023-03-03 ENCOUNTER — Encounter: Payer: Self-pay | Admitting: Family Medicine

## 2023-03-06 ENCOUNTER — Other Ambulatory Visit: Payer: Self-pay | Admitting: Neurology

## 2023-03-06 DIAGNOSIS — G20A1 Parkinson's disease without dyskinesia, without mention of fluctuations: Secondary | ICD-10-CM

## 2023-03-18 DIAGNOSIS — G894 Chronic pain syndrome: Secondary | ICD-10-CM | POA: Diagnosis not present

## 2023-03-18 DIAGNOSIS — Z9689 Presence of other specified functional implants: Secondary | ICD-10-CM | POA: Diagnosis not present

## 2023-03-18 DIAGNOSIS — M5416 Radiculopathy, lumbar region: Secondary | ICD-10-CM | POA: Diagnosis not present

## 2023-03-20 ENCOUNTER — Encounter: Payer: Self-pay | Admitting: Family Medicine

## 2023-03-27 ENCOUNTER — Telehealth: Payer: Self-pay | Admitting: Family Medicine

## 2023-03-27 NOTE — Telephone Encounter (Signed)
FYI: Patient's wife dropped off a letter for Dr. Laural Benes review. I am placing the letter in the providers folder.

## 2023-04-11 ENCOUNTER — Other Ambulatory Visit: Payer: Self-pay | Admitting: Neurology

## 2023-04-11 DIAGNOSIS — G20B1 Parkinson's disease with dyskinesia, without mention of fluctuations: Secondary | ICD-10-CM

## 2023-04-11 DIAGNOSIS — G20A1 Parkinson's disease without dyskinesia, without mention of fluctuations: Secondary | ICD-10-CM

## 2023-04-14 ENCOUNTER — Other Ambulatory Visit: Payer: Self-pay

## 2023-04-14 ENCOUNTER — Telehealth: Payer: Self-pay

## 2023-04-14 DIAGNOSIS — G20B1 Parkinson's disease with dyskinesia, without mention of fluctuations: Secondary | ICD-10-CM

## 2023-04-14 DIAGNOSIS — G20A1 Parkinson's disease without dyskinesia, without mention of fluctuations: Secondary | ICD-10-CM

## 2023-04-14 MED ORDER — ROPINIROLE HCL 3 MG PO TABS: ORAL_TABLET | ORAL | 0 refills | Status: DC

## 2023-04-14 NOTE — Telephone Encounter (Signed)
Rx signed, close encounter

## 2023-04-16 NOTE — Telephone Encounter (Signed)
 PA re submitted via [employer] website and pending decision. Tracking #[id number]

## 2023-04-20 ENCOUNTER — Ambulatory Visit: Payer: Medicare HMO | Admitting: Physician Assistant

## 2023-04-20 DIAGNOSIS — N3941 Urge incontinence: Secondary | ICD-10-CM | POA: Diagnosis not present

## 2023-04-20 NOTE — Progress Notes (Signed)
 PTNS  Session # 1 of 45  Health & Social Factors: Back stimulator placed Caffeine: <1 Alcohol : 0 Daytime voids #per day: 3-4 Night-time voids #per night: 3-4 Urgency: strong Incontinence Episodes #per day: <1 Ankle used: right Treatment Setting: 7 Feeling/ Response: both Comments: Patient tolerated well. Consent signed.  Performed By: Alexya Mcdaris, PA-C   Follow Up: 1 week

## 2023-04-20 NOTE — Patient Instructions (Signed)

## 2023-04-20 NOTE — Telephone Encounter (Signed)
 Auth # [id number]

## 2023-04-20 NOTE — Telephone Encounter (Addendum)
 Spoke with [employer] and PA approved for PTNS 04/20/23-08/13/23. Thomas [name] advised. We should get a fax with this information also.

## 2023-04-27 ENCOUNTER — Ambulatory Visit: Payer: Medicare HMO | Admitting: Physician Assistant

## 2023-04-27 DIAGNOSIS — N3941 Urge incontinence: Secondary | ICD-10-CM

## 2023-04-27 NOTE — Patient Instructions (Signed)

## 2023-04-27 NOTE — Progress Notes (Signed)
 PTNS  Session # 2 of 45  Health & Social Factors: no change Caffeine: <1 Alcohol : 0 Daytime voids #per day: Q3-4 Night-time voids #per night: 2-3 Urgency: mild Incontinence Episodes #per day: 0 Ankle used: right Treatment Setting: 12 Feeling/ Response: sensory Comments: Patient tolerated well  Performed By: Lucie Hones, PA-C   Follow Up: 1 week

## 2023-05-04 ENCOUNTER — Ambulatory Visit: Payer: Medicare HMO | Admitting: Physician Assistant

## 2023-05-04 DIAGNOSIS — N3941 Urge incontinence: Secondary | ICD-10-CM

## 2023-05-04 NOTE — Patient Instructions (Signed)

## 2023-05-04 NOTE — Progress Notes (Signed)
PTNS  Session # 3  Health & Social Factors: no change-did have more stiffness yesterday and feels like its from parkinsons, they will update neurologist as needed. Caffeine: 1-2 Alcohol: 0 Daytime voids #per day: 8-9 Night-time voids #per night: 2 Urgency: some days mild and some days stronger Incontinence Episodes #per day: 1 Ankle used: right Treatment Setting: 3 Feeling/ Response: sensory Comments: f/u 1 week  Performed By: Alvina Chou  Follow Up: 1 week

## 2023-05-08 DIAGNOSIS — M48062 Spinal stenosis, lumbar region with neurogenic claudication: Secondary | ICD-10-CM | POA: Diagnosis not present

## 2023-05-08 DIAGNOSIS — M5126 Other intervertebral disc displacement, lumbar region: Secondary | ICD-10-CM | POA: Diagnosis not present

## 2023-05-08 DIAGNOSIS — M5416 Radiculopathy, lumbar region: Secondary | ICD-10-CM | POA: Diagnosis not present

## 2023-05-08 DIAGNOSIS — Z79899 Other long term (current) drug therapy: Secondary | ICD-10-CM | POA: Diagnosis not present

## 2023-05-11 ENCOUNTER — Ambulatory Visit: Payer: Medicare HMO | Admitting: Physician Assistant

## 2023-05-11 DIAGNOSIS — N3941 Urge incontinence: Secondary | ICD-10-CM | POA: Diagnosis not present

## 2023-05-11 NOTE — Progress Notes (Signed)
PTNS  Session # 4   Health & Social Factors: no change Caffeine: 1 to 2 drinks 1 to 2 times a week, not daily Alcohol: 0 Daytime voids #per day: 6 (about every 2 to 3 hours Night-time voids #per night: 2-3 Urgency: mild Incontinence Episodes #per day: none Ankle used: right Treatment Setting: 10 Feeling/ Response: sensory Comments: none  Performed By: Alvina Chou H  Follow Up: 1 week

## 2023-05-18 ENCOUNTER — Ambulatory Visit: Payer: Medicare HMO | Admitting: Physician Assistant

## 2023-05-18 DIAGNOSIS — N3941 Urge incontinence: Secondary | ICD-10-CM | POA: Diagnosis not present

## 2023-05-18 NOTE — Progress Notes (Signed)
PTNS  Session # 5  Health & Social Factors: no change Caffeine: 1 to 2 drinks a week Alcohol: none Daytime voids #per day: 4 Night-time voids #per night: 2 Urgency: mild Incontinence Episodes #per day: 2 Ankle used: right Treatment Setting: 3 Feeling/ Response: sensory Comments: none  Performed By: Jearld Pies RMA  Follow Up: 1 week

## 2023-05-18 NOTE — Patient Instructions (Signed)

## 2023-05-20 ENCOUNTER — Ambulatory Visit: Payer: Medicare HMO | Admitting: Dermatology

## 2023-05-25 ENCOUNTER — Ambulatory Visit: Payer: Medicare HMO | Admitting: Physician Assistant

## 2023-05-25 DIAGNOSIS — N3941 Urge incontinence: Secondary | ICD-10-CM | POA: Diagnosis not present

## 2023-05-25 NOTE — Patient Instructions (Signed)

## 2023-05-25 NOTE — Progress Notes (Signed)
 PTNS  Session # 6  Health & Social Factors: no change Caffeine: 1 to 2 drinks once or twice a week Alcohol : none Daytime voids #per day: 3 Night-time voids #per night: 2 Urgency: mild Incontinence Episodes #per day: 1 to 2 times a week Ankle used: right Treatment Setting: 7 Feeling/ Response: sensory Comments: none  Performed By: Delane Fear RMA  Follow Up: 1 week

## 2023-05-27 NOTE — Progress Notes (Deleted)
 Assessment/Plan:    1.  Parkinsons Disease             -Continue ropinirole, 3 mg 3 times per day.  Discussed how medication influences memory but I don't feel need to take this one off right now             -Continue carbidopa/levodopa 25/100, 2 tablets at 6 AM/10 AM/2 PM/6 PM             -Continue carbidopa/levodopa 50/200 CR at bedtime  -He is following regularly with dermatology.   -pt doing well with inbrija. Uses rarely  2.  Parkinson's dyskinesia             -Continue amantadine, 100 mg 3 times per day.    -Did discuss that amantadine can affect memory and we will need to watch that closely.  At this point in time, I am not convinced he has PDD.   3.  RBD             -Discussed safety.  Declines medication right now.   4.  MCI             -Neurocognitive test in August, 2020 and March, 2024 without evidence of dementia.  He did have some decline between the 2 tests, but diagnosis was MCI     5.  Neurogenic Orthostatic Hypotension  -BP and pulse low today             -likely due to BP meds in combination with Parkinsons Disease.   He is on hydralazine, lisinopril, metoprolol and tamsulosin which all are driving down blood pressure as well.   Discussed concept of permissive HTN.  I don't think he needs so much med.  He has appt with pcp to discuss.   6.    Lumbar spinal stenosis and neural foraminal stenosis             -status post surgical intervention with microdiscectomy at L3-L4 and L4-L5 on October 11, 2020 with Dr. Venetia Maxon.  He has followed with PM&R at the [facility] clinic and has followed with Dr. Lorrine Kin as well.  Patient does have spinal cord stimulator.  7.  Sleep disturbance  -On trazodone, prescribed by primary care  -On melatonin, 5 mg nightly  8.  Urinary incontinence  -Following with urology.    -has done PTNS  Subjective:   Thomas [name] was seen today in follow up for Parkinsons disease.  My previous records were reviewed prior to todays visit as well as  outside records available to me.  Pt with wife who supplements hx. last visit, the patient had low blood pressure and was bradycardic and was on multiple medications.  We talked about the concept of permissive hypertension in Parkinsons.  He had a follow-up not long thereafter with primary care.  At that visit, his blood pressure was well-controlled, but still was quite bradycardic (pulse was 50).  They decided to continue his medications.  He has followed up with Dr. Lorrine Kin recently regarding lumbar radiculopathy.  Current prescribed movement disorder medications:   Ropinirole, 3 mg 3 times per day Carbidopa/levodopa 25/100, 2 tablets at 6 AM/2 tablets at 10 AM/2 tablets at 2 PM/2 tablets at 6 PM Amantadine 100 mg 3 times per day carbidopa/levodopa 50/200 CR q hs Inbrija (started last visit)   ALLERGIES:   Allergies  Allergen Reactions   Codeine Other (See Comments)    Hallucinations   Other Nausea Only  General anesthesia     CURRENT MEDICATIONS:  Outpatient Encounter Medications as of 05/29/2023  Medication Sig   acetaminophen (TYLENOL) 500 MG tablet 1 to 2 tablets as needed   amantadine (SYMMETREL) 100 MG capsule TAKE 1 CAPSULE BY MOUTH 3 TIMES A DAY   amLODipine (NORVASC) 2.5 MG tablet Take 2.5 mg by mouth daily.   carbidopa-levodopa (SINEMET CR) 50-200 MG tablet TAKE 1 TABLET BY MOUTH AT BEDTIME   carbidopa-levodopa (SINEMET IR) 25-100 MG tablet TAKE TWO TABLETS BY MOUTH FOUR TIMES A DAY AT 6AM, 10AM, 2:00PM AND 6:00PM   cetirizine (ZYRTEC) 10 MG tablet Take 10 mg by mouth at bedtime.   cholecalciferol (VITAMIN D3) 25 MCG (1000 UNIT) tablet Take 1,000 Units by mouth daily.   cyanocobalamin (VITAMIN B12) 1000 MCG tablet Take 1,000 mcg by mouth daily.   diclofenac Sodium (VOLTAREN) 1 % GEL APPLY TOPICALLY 4 GRAMS FOUR TIMES DAILY   folic acid (FOLVITE) 800 MCG tablet Take 400 mcg by mouth daily.   hydrALAZINE (APRESOLINE) 25 MG tablet Take 1 tablet (25 mg total) by mouth 3  (three) times daily as needed (for SBP >160). Takes in the evening   HYDROcodone-acetaminophen (NORCO/VICODIN) 5-325 MG tablet Take 1 tablet by mouth 2 (two) times daily.   Levodopa (INBRIJA) 42 MG CAPS Samples of this drug were given to the patient, quantity 1 box of 32 capsules, Lot Number [id number] Exp: 03/2023  Patient has been educated on dosage and uses of medication.   Levodopa (INBRIJA) 42 MG CAPS Samples of this drug were given to the patient, quantity 1, Lot Number [id number] Exp 12-24   lisinopril (ZESTRIL) 30 MG tablet Take 1 tablet (30 mg total) by mouth daily.   melatonin 5 MG TABS Take 5 mg by mouth at bedtime.   methocarbamol (ROBAXIN) 500 MG tablet Take 1 tablet (500 mg total) by mouth every 6 (six) hours as needed for muscle spasms. (Patient taking differently: Take 500 mg by mouth 2 (two) times daily.)   metoprolol succinate (TOPROL-XL) 25 MG 24 hr tablet Take 1 tablet (25 mg total) by mouth at bedtime.   Multiple Vitamin (MULTIVITAMIN) tablet Take 1 tablet by mouth daily at 6 PM.   oxybutynin (DITROPAN-XL) 10 MG 24 hr tablet Take 10 mg by mouth at bedtime.   polyethylene glycol powder (GLYCOLAX/MIRALAX) 17 GM/SCOOP powder Take 17 g by mouth at bedtime.   PREVIDENT 5000 BOOSTER PLUS 1.1 % PSTE Place 1 application onto teeth 2 (two) times daily.   pyridoxine (B-6) 100 MG tablet Take 100 mg by mouth daily.   rivaroxaban (XARELTO) 20 MG TABS tablet Take 1 tablet (20 mg total) by mouth daily.   rOPINIRole (REQUIP) 3 MG tablet TAKE 1 TABLET BY MOUTH 3 TIMES A DAY   simvastatin (ZOCOR) 40 MG tablet Take 1 tablet (40 mg total) by mouth daily.   tamsulosin (FLOMAX) 0.4 MG CAPS capsule Take 2 capsules (0.8 mg total) by mouth daily.   traZODone (DESYREL) 50 MG tablet Take 1 tablet (50 mg total) by mouth at bedtime.   No facility-administered encounter medications on file as of 05/29/2023.    Objective:   PHYSICAL EXAMINATION:    VITALS:   There were no vitals filed for this  visit.  GEN:  The patient appears stated age and is in NAD. HEENT:  Normocephalic, atraumatic.  The mucous membranes are moist. The superficial temporal arteries are without ropiness or tenderness.  Neurological examination:  Orientation: The patient is alert and oriented x3.  He is able to provide his own history and ask appropriate questions. Cranial nerves: There is good facial symmetry with facial hypomimia. The speech is fluent and clear. Soft palate rises symmetrically and there is no tongue deviation. Hearing is intact to conversational tone. Sensation: Sensation is intact to light touch throughout Motor: Strength is at least antigravity x4.  Movement examination: Tone: There is normal tone in the UE/LE Abnormal movements: he has some mild L leg dyskinesia Coordination:  There is good rapid alternating movements today with the exception of dec foot taps on the L Gait and Station: The patient pushes off to arise with the right hand.  The patient's stride length has slightly decreased stride length and decreased arm swing.  He is flexed at waist  I have reviewed and interpreted the following labs independently    Chemistry      Component Value Date/Time   NA 140 02/12/2023 1455   K 4.2 02/12/2023 1455   CL 103 02/12/2023 1455   CO2 19 (L) 02/12/2023 1455   BUN 16 02/12/2023 1455   CREATININE 0.85 02/12/2023 1455      Component Value Date/Time   CALCIUM 9.1 02/12/2023 1455   ALKPHOS 118 02/12/2023 1455   AST 13 02/12/2023 1455   ALT 11 02/12/2023 1455   BILITOT 0.5 02/12/2023 1455       Lab Results  Component Value Date   WBC 3.9 02/12/2023   HGB 12.3 (L) 02/12/2023   HCT 38.6 02/12/2023   MCV 94 02/12/2023   PLT 168 02/12/2023    Lab Results  Component Value Date   TSH 0.894 07/28/2022    Total time spent on today's visit was *** minutes, including both face-to-face time and nonface-to-face time.  Time included that spent on review of records (prior notes  available to me/labs/imaging if pertinent), discussing treatment and goals, answering patient's questions and coordinating care.   Cc:  Olevia Perches P, DO

## 2023-05-29 ENCOUNTER — Ambulatory Visit: Payer: Medicare HMO | Admitting: Neurology

## 2023-06-01 ENCOUNTER — Ambulatory Visit (INDEPENDENT_AMBULATORY_CARE_PROVIDER_SITE_OTHER): Payer: Medicare HMO | Admitting: Physician Assistant

## 2023-06-01 DIAGNOSIS — N3941 Urge incontinence: Secondary | ICD-10-CM | POA: Diagnosis not present

## 2023-06-01 NOTE — Progress Notes (Signed)
 PTNS  Session # 7  Health & Social Factors: no change Caffeine: 1 to 2 drinks once or twice a week Alcohol: none Daytime voids #per day: 3-4 Night-time voids #per night: 2-3 Urgency: none Incontinence Episodes #per day: 1 times a week Ankle used: right Treatment Setting: 7 Feeling/ Response: sensory Comments: f/u 1 week  Performed By: Jearld Pies RMA  Follow Up: f/u 1 week

## 2023-06-01 NOTE — Patient Instructions (Signed)

## 2023-06-02 NOTE — Progress Notes (Unsigned)
 Virtual Visit Via Video       Consent was obtained for video visit:  {yes no:314532} Answered questions that patient had about telehealth interaction:  {yes no:314532} I discussed the limitations, risks, security and privacy concerns of performing an evaluation and management service by telemedicine. I also discussed with the patient that there may be a patient responsible charge related to this service. The patient expressed understanding and agreed to proceed.  Pt location: Home Physician Location: office Name of referring provider:  Dorcas Carrow, DO I connected with Thomas [name] at patients initiation/request on 06/04/2023 at  2:30 PM EST by video enabled telemedicine application and verified that I am speaking with the correct person using two identifiers. Pt MRN:  [id number] Pt DOB:  1950-10-25 Video Participants:  Thomas [name];  ***  Assessment/Plan:    1.  Parkinsons Disease             -Continue ropinirole, 3 mg 3 times per day.  Discussed how medication influences memory but I don't feel need to take this one off right now             -Continue carbidopa/levodopa 25/100, 2 tablets at 6 AM/10 AM/2 PM/6 PM             -Continue carbidopa/levodopa 50/200 CR at bedtime  -He is following regularly with dermatology.   -pt doing well with inbrija. Uses rarely  2.  Parkinson's dyskinesia             -Continue amantadine, 100 mg 3 times per day.    -Did discuss that amantadine can affect memory and we will need to watch that closely.  At this point in time, I am not convinced he has PDD.   3.  RBD             -Discussed safety.  Declines medication right now.   4.  MCI             -Neurocognitive test in August, 2020 and March, 2024 without evidence of dementia.  He did have some decline between the 2 tests, but diagnosis was MCI     5.  Neurogenic Orthostatic Hypotension  -BP and pulse low today             -likely due to BP meds in combination with Parkinsons Disease.    He is on hydralazine, lisinopril, metoprolol and tamsulosin which all are driving down blood pressure as well.   Discussed concept of permissive HTN.  I don't think he needs so much med.  He has appt with pcp to discuss.   6.    Lumbar spinal stenosis and neural foraminal stenosis             -status post surgical intervention with microdiscectomy at L3-L4 and L4-L5 on October 11, 2020 with Dr. Venetia Maxon.  He has followed with PM&R at the [facility] clinic and has followed with Dr. Lorrine Kin as well.  Patient does have spinal cord stimulator.  7.  Sleep disturbance  -On trazodone, prescribed by primary care  -On melatonin, 5 mg nightly  8.  Urinary incontinence  -Following with urology.    -has done PTNS  Subjective:   Thomas [name] was seen today in follow up for Parkinsons disease.  My previous records were reviewed prior to todays visit as well as outside records available to me.  Pt with wife who supplements hx. last visit, the patient had low blood pressure and was bradycardic and was  on multiple medications.  We talked about the concept of permissive hypertension in Parkinsons.  He had a follow-up not long thereafter with primary care.  At that visit, his blood pressure was well-controlled, but still was quite bradycardic (pulse was 50).  They decided to continue his medications.  He has followed up with Dr. Lorrine Kin recently regarding lumbar radiculopathy.  Current prescribed movement disorder medications:   Ropinirole, 3 mg 3 times per day Carbidopa/levodopa 25/100, 2 tablets at 6 AM/2 tablets at 10 AM/2 tablets at 2 PM/2 tablets at 6 PM Amantadine 100 mg 3 times per day carbidopa/levodopa 50/200 CR q hs Inbrija (started last visit)   ALLERGIES:   Allergies  Allergen Reactions   Codeine Other (See Comments)    Hallucinations   Other Nausea Only    General anesthesia     CURRENT MEDICATIONS:  Outpatient Encounter Medications as of 06/04/2023  Medication Sig   acetaminophen (TYLENOL)  500 MG tablet 1 to 2 tablets as needed   amantadine (SYMMETREL) 100 MG capsule TAKE 1 CAPSULE BY MOUTH 3 TIMES A DAY   amLODipine (NORVASC) 2.5 MG tablet Take 2.5 mg by mouth daily.   carbidopa-levodopa (SINEMET CR) 50-200 MG tablet TAKE 1 TABLET BY MOUTH AT BEDTIME   carbidopa-levodopa (SINEMET IR) 25-100 MG tablet TAKE TWO TABLETS BY MOUTH FOUR TIMES A DAY AT 6AM, 10AM, 2:00PM AND 6:00PM   cetirizine (ZYRTEC) 10 MG tablet Take 10 mg by mouth at bedtime.   cholecalciferol (VITAMIN D3) 25 MCG (1000 UNIT) tablet Take 1,000 Units by mouth daily.   cyanocobalamin (VITAMIN B12) 1000 MCG tablet Take 1,000 mcg by mouth daily.   diclofenac Sodium (VOLTAREN) 1 % GEL APPLY TOPICALLY 4 GRAMS FOUR TIMES DAILY   folic acid (FOLVITE) 800 MCG tablet Take 400 mcg by mouth daily.   hydrALAZINE (APRESOLINE) 25 MG tablet Take 1 tablet (25 mg total) by mouth 3 (three) times daily as needed (for SBP >160). Takes in the evening   HYDROcodone-acetaminophen (NORCO/VICODIN) 5-325 MG tablet Take 1 tablet by mouth 2 (two) times daily.   Levodopa (INBRIJA) 42 MG CAPS Samples of this drug were given to the patient, quantity 1 box of 32 capsules, Lot Number [id number] Exp: 03/2023  Patient has been educated on dosage and uses of medication.   Levodopa (INBRIJA) 42 MG CAPS Samples of this drug were given to the patient, quantity 1, Lot Number [id number] Exp 12-24   lisinopril (ZESTRIL) 30 MG tablet Take 1 tablet (30 mg total) by mouth daily.   melatonin 5 MG TABS Take 5 mg by mouth at bedtime.   methocarbamol (ROBAXIN) 500 MG tablet Take 1 tablet (500 mg total) by mouth every 6 (six) hours as needed for muscle spasms. (Patient taking differently: Take 500 mg by mouth 2 (two) times daily.)   metoprolol succinate (TOPROL-XL) 25 MG 24 hr tablet Take 1 tablet (25 mg total) by mouth at bedtime.   Multiple Vitamin (MULTIVITAMIN) tablet Take 1 tablet by mouth daily at 6 PM.   oxybutynin (DITROPAN-XL) 10 MG 24 hr tablet Take 10 mg  by mouth at bedtime.   polyethylene glycol powder (GLYCOLAX/MIRALAX) 17 GM/SCOOP powder Take 17 g by mouth at bedtime.   PREVIDENT 5000 BOOSTER PLUS 1.1 % PSTE Place 1 application onto teeth 2 (two) times daily.   pyridoxine (B-6) 100 MG tablet Take 100 mg by mouth daily.   rivaroxaban (XARELTO) 20 MG TABS tablet Take 1 tablet (20 mg total) by mouth daily.   rOPINIRole (REQUIP)  3 MG tablet TAKE 1 TABLET BY MOUTH 3 TIMES A DAY   simvastatin (ZOCOR) 40 MG tablet Take 1 tablet (40 mg total) by mouth daily.   tamsulosin (FLOMAX) 0.4 MG CAPS capsule Take 2 capsules (0.8 mg total) by mouth daily.   traZODone (DESYREL) 50 MG tablet Take 1 tablet (50 mg total) by mouth at bedtime.   No facility-administered encounter medications on file as of 06/04/2023.    Objective:   PHYSICAL EXAMINATION:    VITALS:   There were no vitals filed for this visit.  GEN:  The patient appears stated age and is in NAD. HEENT:  Normocephalic, atraumatic.  The mucous membranes are moist. The superficial temporal arteries are without ropiness or tenderness.  Neurological examination:  Orientation: The patient is alert and oriented x3.  He is able to provide his own history and ask appropriate questions. Cranial nerves: There is good facial symmetry with facial hypomimia. The speech is fluent and clear.  Hearing is intact to conversational tone. Sensation: Sensation is intact to light touch throughout Motor: Strength is at least antigravity x4.  Movement examination: Abnormal movements: he has some mild L leg dyskinesia Coordination:  There is good rapid alternating movements today with the exception of dec foot taps on the L Gait and Station: The patient pushes off to arise with the right hand.  The patient's stride length has slightly decreased stride length and decreased arm swing.  He is flexed at waist  I have reviewed and interpreted the following labs independently    Chemistry      Component Value  Date/Time   NA 140 02/12/2023 1455   K 4.2 02/12/2023 1455   CL 103 02/12/2023 1455   CO2 19 (L) 02/12/2023 1455   BUN 16 02/12/2023 1455   CREATININE 0.85 02/12/2023 1455      Component Value Date/Time   CALCIUM 9.1 02/12/2023 1455   ALKPHOS 118 02/12/2023 1455   AST 13 02/12/2023 1455   ALT 11 02/12/2023 1455   BILITOT 0.5 02/12/2023 1455       Lab Results  Component Value Date   WBC 3.9 02/12/2023   HGB 12.3 (L) 02/12/2023   HCT 38.6 02/12/2023   MCV 94 02/12/2023   PLT 168 02/12/2023    Lab Results  Component Value Date   TSH 0.894 07/28/2022   Follow up Instructions      -I discussed the assessment and treatment plan with the patient. The patient was provided an opportunity to ask questions and all were answered. The patient agreed with the plan and demonstrated an understanding of the instructions.   The patient was advised to call back or seek an in-person evaluation if the symptoms worsen or if the condition fails to improve as anticipated.    Total time spent on today's visit was ***minutes, including both face-to-face time and nonface-to-face time.  Time included that spent on review of records (prior notes available to me/labs/imaging if pertinent), discussing treatment and goals, answering patient's questions and coordinating care.   Kerin Salen, DO   Cc:  Johnson, Megan P, DO

## 2023-06-04 ENCOUNTER — Encounter: Payer: Self-pay | Admitting: Neurology

## 2023-06-04 ENCOUNTER — Telehealth (INDEPENDENT_AMBULATORY_CARE_PROVIDER_SITE_OTHER): Payer: Medicare HMO | Admitting: Neurology

## 2023-06-04 VITALS — BP 117/64 | HR 56 | Wt 215.0 lb

## 2023-06-04 DIAGNOSIS — R32 Unspecified urinary incontinence: Secondary | ICD-10-CM | POA: Diagnosis not present

## 2023-06-04 DIAGNOSIS — R413 Other amnesia: Secondary | ICD-10-CM

## 2023-06-04 DIAGNOSIS — G20B2 Parkinson's disease with dyskinesia, with fluctuations: Secondary | ICD-10-CM

## 2023-06-04 MED ORDER — ROPINIROLE HCL 2 MG PO TABS: 2.0000 mg | ORAL_TABLET | Freq: Three times a day (TID) | ORAL | 1 refills | Status: DC

## 2023-06-05 ENCOUNTER — Telehealth: Payer: Self-pay | Admitting: Physician Assistant

## 2023-06-05 NOTE — Telephone Encounter (Signed)
 PT called stating that [facility] stated that there PTNS is not approved and PT would like some clarification.

## 2023-06-07 ENCOUNTER — Other Ambulatory Visit: Payer: Self-pay | Admitting: Neurology

## 2023-06-07 DIAGNOSIS — G20A1 Parkinson's disease without dyskinesia, without mention of fluctuations: Secondary | ICD-10-CM

## 2023-06-08 ENCOUNTER — Ambulatory Visit: Payer: Medicare HMO | Admitting: Physician Assistant

## 2023-06-08 DIAGNOSIS — N3941 Urge incontinence: Secondary | ICD-10-CM | POA: Diagnosis not present

## 2023-06-08 NOTE — Patient Instructions (Signed)

## 2023-06-08 NOTE — Progress Notes (Signed)
 PTNS   Session # 8   Health & Social Factors: no change Caffeine: 1 to 2 drinks once or twice a week Alcohol: none Daytime voids #per day: 3 Night-time voids #per night: 2 Urgency: none Incontinence Episodes #per day: 1 times a week Ankle used: right Treatment Setting: 7 Feeling/ Response: sensory Comments: f/u 1 week   Performed By: Carman Ching, PA-C    Follow Up: f/u 1 week

## 2023-06-09 ENCOUNTER — Telehealth: Payer: Self-pay

## 2023-06-09 NOTE — Telephone Encounter (Signed)
 Per their website PA approved. Mrs Peddie verbalized understanding and states she spoke with Sam PA yesterday about this. Neurostimulators  Procedure code 16109  Submission date 04/16/2023 3:04 PM  Dates of service 04/20/2023 -- 08/13/2023  Approved Authorization #[id number]  Tracking #[id number]

## 2023-06-09 NOTE — Telephone Encounter (Signed)
 Forwarding

## 2023-06-09 NOTE — Telephone Encounter (Signed)
Open in error

## 2023-06-15 ENCOUNTER — Ambulatory Visit: Payer: Medicare HMO | Admitting: Physician Assistant

## 2023-06-15 DIAGNOSIS — N3941 Urge incontinence: Secondary | ICD-10-CM

## 2023-06-15 NOTE — Patient Instructions (Signed)

## 2023-06-15 NOTE — Progress Notes (Signed)
 PTNS  Session # 9  Health & Social Factors: no change Caffeine: <1 Alcohol: 0 Daytime voids #per day: 4 Night-time voids #per night: 3-4 Urgency: mild-strong Incontinence Episodes #per day: <1 Ankle used: right Treatment Setting: 5 Feeling/ Response: sensory Comments: Patient tolerated well  Performed By: Carman Ching, PA-C   Follow Up: 1 week

## 2023-06-19 ENCOUNTER — Telehealth: Payer: Self-pay

## 2023-06-19 NOTE — Telephone Encounter (Signed)
 I don't know what she's talking about- who is billing her? Who does she need a referral to?

## 2023-06-19 NOTE — Telephone Encounter (Signed)
 Routing to provider and Print production planner.    Copied from CRM [id number]. Topic: General - Billing Inquiry >> Jun 19, 2023  9:40 AM Epimenio Foot F wrote: Reason for CRM: Patient's wife is calling in because they received a bill and patient's wife said she needs for Dr. Laural Benes to send the referral information so they can stop getting billed. Wilkie Aye says Dr. Laural Benes has all the information and should know what she's talking about.

## 2023-06-22 ENCOUNTER — Ambulatory Visit: Payer: Medicare HMO | Admitting: Physician Assistant

## 2023-06-22 DIAGNOSIS — N3941 Urge incontinence: Secondary | ICD-10-CM

## 2023-06-22 NOTE — Telephone Encounter (Signed)
 Spoke further to the wife. She advises that starting in February his procedures through Urology are no longer being paid for. From what she is saying its hard to determine if this is a [facility] billing issue or something related to insurance. The letter she received did however come form [facility]. I asked that she also reach out to billing as well and that I would send a request of our office admin for assistance as well.

## 2023-06-22 NOTE — Progress Notes (Signed)
 PTNS  Session # 10  Health & Social Factors: no change Caffeine: 2 drinks a week Alcohol: none Daytime voids #per day: 5 Night-time voids #per night: 5 Urgency: mild Incontinence Episodes #per day: 2 times a week Ankle used: right Treatment Setting: 9 Feeling/ Response: sensory Comments: n/a  Performed By: Jearld Pies RMA  Follow Up: 1 week.

## 2023-06-22 NOTE — Patient Instructions (Signed)

## 2023-06-29 ENCOUNTER — Ambulatory Visit (INDEPENDENT_AMBULATORY_CARE_PROVIDER_SITE_OTHER): Payer: Medicare HMO | Admitting: Physician Assistant

## 2023-06-29 DIAGNOSIS — N3941 Urge incontinence: Secondary | ICD-10-CM

## 2023-06-29 NOTE — Patient Instructions (Signed)

## 2023-06-29 NOTE — Progress Notes (Signed)
 PTNS  Session # 11  Health & Social Factors: no change Caffeine: 2 Alcohol: 0 Daytime voids #per day: every 2.5 hrs Night-time voids #per night: every 3 hrs Urgency: mild Incontinence Episodes #per day: 0 Ankle used: right Treatment Setting: 7 Feeling/ Response: both Comments: Patient tolerated well.  Performed By: Carman Ching, PA-C   Follow Up: 1 week

## 2023-07-02 ENCOUNTER — Telehealth: Payer: Self-pay | Admitting: Neurology

## 2023-07-02 NOTE — Telephone Encounter (Signed)
 Called patients wife to verify the RX and let her know the patient does need an appointment in 4-5 months  He will decrease his ropinirole from 3 mg 3 times per day to 2 mg in the morning, 3 mg in the afternoon, 3 mg in the evening for 2 weeks and then take 2 mg in the morning, 2 mg in the afternoon and 3 mg in the evening for 2 weeks and then he will decrease this to 2 mg 3 times per day.

## 2023-07-02 NOTE — Telephone Encounter (Signed)
 1. Which medications need refilled? (List name and dosage, if known) Ropinirole  2. Which pharmacy/location is medication to be sent to? (include street and city if local pharmacy) [facility] [place]  Wife left message wanting prescription called in. Notes from last visit did not state if a follow up appointment was needed.

## 2023-07-06 ENCOUNTER — Ambulatory Visit (INDEPENDENT_AMBULATORY_CARE_PROVIDER_SITE_OTHER): Payer: Medicare HMO | Admitting: Physician Assistant

## 2023-07-06 ENCOUNTER — Other Ambulatory Visit: Payer: Self-pay

## 2023-07-06 DIAGNOSIS — N3941 Urge incontinence: Secondary | ICD-10-CM | POA: Diagnosis not present

## 2023-07-06 DIAGNOSIS — G20B2 Parkinson's disease with dyskinesia, with fluctuations: Secondary | ICD-10-CM

## 2023-07-06 MED ORDER — ROPINIROLE HCL 2 MG PO TABS: ORAL_TABLET | ORAL | 0 refills | Status: DC

## 2023-07-06 NOTE — Patient Instructions (Signed)

## 2023-07-06 NOTE — Progress Notes (Signed)
 PTNS   Session # 12   Health & Social Factors: no change Caffeine: 2 Alcohol: 0 Daytime voids #per day: every 2.5 hrs Night-time voids #per night: every 3 hrs Urgency: mild Incontinence Episodes #per day: 0 Ankle used: right Treatment Setting: 11 Feeling/ Response: sensory Comments: Patient tolerated well.   Performed By: Carman Ching, PA-C    Follow Up: 3 weeks with Dr. Sherron Monday

## 2023-07-09 ENCOUNTER — Other Ambulatory Visit: Payer: Self-pay

## 2023-07-09 DIAGNOSIS — G20B2 Parkinson's disease with dyskinesia, with fluctuations: Secondary | ICD-10-CM

## 2023-07-09 MED ORDER — ROPINIROLE HCL 2 MG PO TABS: ORAL_TABLET | ORAL | 0 refills | Status: DC

## 2023-07-09 NOTE — Telephone Encounter (Signed)
 Left message with the after hour service on 07-09-23 at 12:18 pm   Caller states that her husband was to have a RX sent in and it was not sent  did not leave the name of medication

## 2023-07-09 NOTE — Telephone Encounter (Signed)
 Called patients wife and let her know I sent it on Monday. I resent the RX and told patients wife to call me back today if they do not receive it

## 2023-07-13 ENCOUNTER — Other Ambulatory Visit: Payer: Self-pay | Admitting: Neurology

## 2023-07-13 DIAGNOSIS — G20B1 Parkinson's disease with dyskinesia, without mention of fluctuations: Secondary | ICD-10-CM

## 2023-07-13 DIAGNOSIS — G20A1 Parkinson's disease without dyskinesia, without mention of fluctuations: Secondary | ICD-10-CM

## 2023-07-27 ENCOUNTER — Telehealth: Payer: Self-pay | Admitting: Neurology

## 2023-07-27 ENCOUNTER — Other Ambulatory Visit: Payer: Self-pay

## 2023-07-27 DIAGNOSIS — M1990 Unspecified osteoarthritis, unspecified site: Secondary | ICD-10-CM

## 2023-07-27 DIAGNOSIS — M47816 Spondylosis without myelopathy or radiculopathy, lumbar region: Secondary | ICD-10-CM

## 2023-07-27 DIAGNOSIS — M5430 Sciatica, unspecified side: Secondary | ICD-10-CM

## 2023-07-27 DIAGNOSIS — M5126 Other intervertebral disc displacement, lumbar region: Secondary | ICD-10-CM

## 2023-07-27 NOTE — Telephone Encounter (Signed)
 Called patients wife and apologized that Dr. Winferd Hatter did not feel comfortable sending in that referral to [facility] that she needed to call PCP ot get that referral placed

## 2023-07-27 NOTE — Telephone Encounter (Signed)
 Pt.s wife called as Dr. Winferd Hatter had referred to is leaving the practice and they need new referral from  [facility]

## 2023-07-27 NOTE — Telephone Encounter (Signed)
 [place] and [place] are both gone from [employer]. Patient lives in [place] . I sent referral to [facility] neurosurgery to see if they maybe able to monitor patient

## 2023-07-27 NOTE — Addendum Note (Signed)
 Addended by: Ortencia Blamer C on: 07/27/2023 01:53 PM   Modules accepted: Orders

## 2023-07-29 ENCOUNTER — Telehealth: Payer: Self-pay | Admitting: Neurology

## 2023-07-29 NOTE — Telephone Encounter (Signed)
 Pt's wife called in stating the pt's pain is increasing. She states his pain management doctor wants to put him on prednisone. She wants to see what Dr. Winferd Hatter thinks about him being on prednisone? She thinks it would be continually.

## 2023-07-29 NOTE — Telephone Encounter (Signed)
 Gave patiens wife Dr. Mela Spinner advice and she did let me know they decided to come back to [facility] for Pain management just FYI

## 2023-08-03 ENCOUNTER — Encounter: Payer: Self-pay | Admitting: Urology

## 2023-08-03 ENCOUNTER — Ambulatory Visit (INDEPENDENT_AMBULATORY_CARE_PROVIDER_SITE_OTHER): Payer: Self-pay | Admitting: Urology

## 2023-08-03 VITALS — BP 97/59 | Ht 69.5 in | Wt 218.0 lb

## 2023-08-03 DIAGNOSIS — N3941 Urge incontinence: Secondary | ICD-10-CM | POA: Diagnosis not present

## 2023-08-03 LAB — URINALYSIS, COMPLETE
Bilirubin, UA: NEGATIVE
Glucose, UA: NEGATIVE
Leukocytes,UA: NEGATIVE
Nitrite, UA: NEGATIVE
RBC, UA: NEGATIVE
Specific Gravity, UA: 1.025 (ref 1.005–1.030)
Urobilinogen, Ur: 0.2 mg/dL (ref 0.2–1.0)
pH, UA: 6 (ref 5.0–7.5)

## 2023-08-03 LAB — MICROSCOPIC EXAMINATION

## 2023-08-03 MED ORDER — OXYBUTYNIN CHLORIDE ER 10 MG PO TB24: 10.0000 mg | ORAL_TABLET | Freq: Every day | ORAL | 3 refills | Status: DC

## 2023-08-03 NOTE — Progress Notes (Signed)
 08/03/2023 10:37 AM   [name] 09-Jan-1951 [id number]  Referring provider: Solomon Dupre, DO [address] [place],  [place] [address]  Chief Complaint  Patient presents with   Urinary Incontinence    HPI: I was consulted to assess the patient's urgency incontinence.  He had Parkinson's for approximately 13 years.  He voids every 60 to 90 minutes and gets up least 3 times a night and does not have ankle edema.  He has urge incontinence but no stress incontinence or bedwetting and does not wear a pad.  His flow is good   A few years ago he had low back surgery and 15 years ago he had a stroke.  He is prone to constipation.  He is on Flomax      Postvoid residual 0 mL   Patient has Parkinson's with frequency and urge incontinence.  I do not think he needs cystoscopy at this stage or urodynamics but may need 1 or both in the future.  I will see him back on Flomax  in combination with Myrbetriq  25 mg samples and a prescription and keep an eye on his postvoid urine volume.  Rare risk of retention discussed.  I did mention that further testing may be needed in the future depending on how he does with medical therapy    Send on Flomax  twice a day.  Oxybutynin  helped well at first but now 50% improved.  Still leaks some.  Myrbetriq  did not help.  He is prone to constipation.     patient will try Vesicare .  Stay on Flomax .  Check residual next visit.  We can always go back on oxybutynin .  We talked about the pros and cons of an antimuscarinic and parkinsonism patients.  I will discuss percutaneous tibial nerve stimulation next visit.  Appears that he is never tried [place]    Today Patient actually tried trospium .  It did not help.  Still has urgency and frequency but is not incontinent according to him today.  I think from time to time he does have urge incontinence  clinically not infected   Discuss percutaneous tibial nerve stimulation full template. Handout given. Co-pay will be checked. Urine  sent for culture. He would like to go back on oxybutynin  ER 10 mg 30 x 11 is a partial responder and this was sent    Today Patient on oxybutynin  patient treated with percutaneous tibial nerve stimulation. Urgency and frequency dramatically better.  Very pleased.  Clinically not infected.  No urgency incontinence    PMH: Past Medical History:  Diagnosis Date   Activated protein C resistance 06/28/2019   Arthritis of knee    Arthropathy of lumbar facet joint    Atrial fibrillation    BPH (benign prostatic hyperplasia) 09/10/2018   DVT (deep venous thrombosis)    L leg   Dysplastic nevus 05/15/2021   Left Superior Pectoral, mod to severe, Excised 07/16/21   Erectile dysfunction 03/01/2015   Herniated lumbar disc without myelopathy 10/11/2020   Hyperchloremia    Hypercholesterolemia    Hypertension    Inability to walk 02/22/2021   Insomnia due to medical condition 09/30/2018   Major depressive disorder 01/22/2022   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   Nephrolithiasis    Osteoarthritis    Parkinson's disease (HCC)    Plantar wart    Pneumonia    Post-operative nausea and vomiting 09/20/2020   Pulmonary embolism    Sciatica    Seasonal allergies  TIA (transient ischemic attack)     Surgical History: Past Surgical History:  Procedure Laterality Date   APPENDECTOMY     BOWEL DECOMPRESSION  10/15/2022   Procedure: BOWEL DECOMPRESSION;  Surgeon: Quintin Buckle, DO;  Location: [facility] ENDOSCOPY;  Service: Gastroenterology;;   CATARACT EXTRACTION Bilateral 10/2019 11/2019   COLON RESECTION SIGMOID N/A 10/17/2022   Procedure: COLON RESECTION SIGMOID;  Surgeon: Emmalene Hare, MD;  Location: [facility] ORS;  Service: General;  Laterality: N/A;   COLONOSCOPY WITH PROPOFOL  N/A 07/27/2019   Procedure: COLONOSCOPY WITH PROPOFOL ;  Surgeon: Selena Daily, MD;  Location: [facility] ENDOSCOPY;  Service: Endoscopy;  Laterality: N/A;   EYE SURGERY      FLEXIBLE SIGMOIDOSCOPY N/A 10/15/2022   Procedure: FLEXIBLE SIGMOIDOSCOPY;  Surgeon: Quintin Buckle, DO;  Location: [facility] ENDOSCOPY;  Service: Gastroenterology;  Laterality: N/A;   KNEE ARTHROSCOPY WITH MENISCAL REPAIR Right 2009   KNEE SURGERY Right    LUMBAR LAMINECTOMY/DECOMPRESSION MICRODISCECTOMY Left 10/11/2020   Procedure: Left Lumbar Three-Four, Lumbar Fou-Five  Microdiscectomy;  Surgeon: Manya Sells, MD;  [facility] OR;  Service: Neurosurgery;  Laterality: Left;   melanoma removal Left 07/16/2021   chest   PAIN PUMP IMPLANTATION     T&A     TONSILLECTOMY      Home Medications:  Allergies as of 08/03/2023       Reactions   Codeine Other (See Comments)   Hallucinations   Other Nausea Only   General anesthesia         Medication List        Accurate as of August 03, 2023 10:37 AM. If you have any questions, ask your nurse or doctor.          acetaminophen  500 MG tablet Commonly known as: TYLENOL  1 to 2 tablets as needed   amantadine  100 MG capsule Commonly known as: SYMMETREL  TAKE 1 CAPSULE BY MOUTH 3 TIMES A DAY   amLODipine  2.5 MG tablet Commonly known as: NORVASC  Take 2.5 mg by mouth daily.   carbidopa -levodopa  25-100 MG tablet Commonly known as: SINEMET  IR TAKE TWO TABLETS BY MOUTH FOUR TIMES A DAY AT 6 IN THE MORNING, 10 IN THE MORNING, 2 IN THE AFTERNOON AND 6 IN THE EVENING   carbidopa -levodopa  50-200 MG tablet Commonly known as: SINEMET  CR TAKE 1 TABLET BY MOUTH AT BEDTIME   cetirizine 10 MG tablet Commonly known as: ZYRTEC Take 10 mg by mouth at bedtime.   cholecalciferol  25 MCG (1000 UNIT) tablet Commonly known as: VITAMIN D3 Take 1,000 Units by mouth daily.   cyanocobalamin 1000 MCG tablet Commonly known as: VITAMIN B12 Take 1,000 mcg by mouth daily.   diclofenac  Sodium 1 % Gel Commonly known as: VOLTAREN  APPLY TOPICALLY 4 GRAMS FOUR TIMES DAILY   folic acid 800 MCG tablet Commonly known as: FOLVITE Take 400 mcg by  mouth daily.   hydrALAZINE  25 MG tablet Commonly known as: APRESOLINE  Take 1 tablet (25 mg total) by mouth 3 (three) times daily as needed (for SBP >160). Takes in the evening   HYDROcodone -acetaminophen  5-325 MG tablet Commonly known as: NORCO/VICODIN Take 1 tablet by mouth 2 (two) times daily.   Inbrija  42 MG Caps Generic drug: Levodopa  Samples of this drug were given to the patient, quantity 1 box of 32 capsules, Lot Number [id number] Exp: 03/2023  Patient has been educated on dosage and uses of medication.   Inbrija  42 MG Caps Generic drug: Levodopa  Samples of this drug were given to the patient, quantity 1,  Lot Number [id number] Exp 12-24   lisinopril  30 MG tablet Commonly known as: ZESTRIL  Take 1 tablet (30 mg total) by mouth daily.   melatonin 5 MG Tabs Take 5 mg by mouth at bedtime.   methocarbamol  500 MG tablet Commonly known as: ROBAXIN  Take 1 tablet (500 mg total) by mouth every 6 (six) hours as needed for muscle spasms. What changed: when to take this   metoprolol  succinate 25 MG 24 hr tablet Commonly known as: TOPROL -XL Take 1 tablet (25 mg total) by mouth at bedtime.   multivitamin tablet Take 1 tablet by mouth daily at 6 PM.   oxybutynin  10 MG 24 hr tablet Commonly known as: DITROPAN -XL Take 10 mg by mouth at bedtime.   polyethylene glycol powder 17 GM/SCOOP powder Commonly known as: GLYCOLAX /MIRALAX  Take 17 g by mouth at bedtime.   PreviDent 5000 Booster Plus 1.1 % Pste Generic drug: Sodium Fluoride  Place 1 application onto teeth 2 (two) times daily.   pyridoxine 100 MG tablet Commonly known as: B-6 Take 100 mg by mouth daily.   rivaroxaban  20 MG Tabs tablet Commonly known as: Xarelto  Take 1 tablet (20 mg total) by mouth daily.   rOPINIRole  2 MG tablet Commonly known as: REQUIP  take 2 mg in the morning, 2 mg in the afternoon and 3 mg in the evening for 2 weeks and then he will decrease this to 2 mg 3 times per day.   simvastatin  40 MG  tablet Commonly known as: ZOCOR  Take 1 tablet (40 mg total) by mouth daily.   tamsulosin  0.4 MG Caps capsule Commonly known as: FLOMAX  Take 2 capsules (0.8 mg total) by mouth daily.   traZODone  50 MG tablet Commonly known as: DESYREL  Take 1 tablet (50 mg total) by mouth at bedtime.        Allergies:  Allergies  Allergen Reactions   Codeine Other (See Comments)    Hallucinations   Other Nausea Only    General anesthesia     Family History: Family History  Problem Relation Age of Onset   Parkinson's disease Father    Coronary artery disease Mother    Heart attack Sister    Deep vein thrombosis Sister     Social History:  reports that he has never smoked. He has never been exposed to tobacco smoke. He has never used smokeless tobacco. He reports that he does not drink alcohol  and does not use drugs.  ROS:                                        Physical Exam: Ht 5' 9.5" (1.765 m)   BMI 31.29 kg/m   Constitutional:  Alert and oriented, No acute distress. HEENT: [place] AT, moist mucus membranes.  Trachea midline, no masses.   Laboratory Data: Lab Results  Component Value Date   WBC 3.9 02/12/2023   HGB 12.3 (L) 02/12/2023   HCT 38.6 02/12/2023   MCV 94 02/12/2023   PLT 168 02/12/2023    Lab Results  Component Value Date   CREATININE 0.85 02/12/2023    No results found for: "PSA"  No results found for: "TESTOSTERONE"  Lab Results  Component Value Date   HGBA1C 5.4 09/18/2020    Urinalysis    Component Value Date/Time   COLORURINE YELLOW (A) 10/15/2022 1107   APPEARANCEUR Clear 01/26/2023 1513   LABSPEC 1.009 10/15/2022 1107   PHURINE 7.0  10/15/2022 1107   GLUCOSEU Negative 01/26/2023 1513   HGBUR NEGATIVE 10/15/2022 1107   BILIRUBINUR Negative 01/26/2023 1513   KETONESUR NEGATIVE 10/15/2022 1107   PROTEINUR Negative 01/26/2023 1513   PROTEINUR NEGATIVE 10/15/2022 1107   NITRITE Negative 01/26/2023 1513   NITRITE  NEGATIVE 10/15/2022 1107   LEUKOCYTESUR Negative 01/26/2023 1513   LEUKOCYTESUR NEGATIVE 10/15/2022 1107    Pertinent Imaging:   Assessment & Plan: Renew oxybutynin  90 x 3.  Continue with maintenance PTNS.  See in 1 year  1. Urgency incontinence (Primary)  - Urinalysis, Complete   No follow-ups on file.  Devorah Fonder, MD  [facility] [address], [address] [place], [place] [address] [phone]

## 2023-08-11 ENCOUNTER — Telehealth: Payer: Self-pay | Admitting: Neurology

## 2023-08-11 ENCOUNTER — Other Ambulatory Visit: Payer: Self-pay

## 2023-08-11 MED ORDER — INBRIJA 42 MG IN CAPS: ORAL_CAPSULE | RESPIRATORY_TRACT | 2 refills | Status: DC

## 2023-08-11 NOTE — Telephone Encounter (Signed)
 Pt. Wife would like refill of Inbrija  and also discuss discounts of generic equivalents

## 2023-08-11 NOTE — Telephone Encounter (Signed)
 Sent to [facility] and patient is already enrolled in PAP with Inbrijia and PA is still good. You can ot use Good RX because of the insurance of this patient [facility] Medicare

## 2023-08-12 ENCOUNTER — Telehealth: Payer: Self-pay | Admitting: Neurology

## 2023-08-12 NOTE — Telephone Encounter (Signed)
 Pt.s wife does not want Rx Inbrija  called in to pharmacy because she found away to get it cheaper sent by mail order service with Inbrija  directly at a way lower cost, please wife bk

## 2023-08-12 NOTE — Telephone Encounter (Signed)
 Called patients wife and gave details of me research and that there is no generic and they wont let us  use coupons due to the medicare. Cost is based on deductible

## 2023-08-13 ENCOUNTER — Telehealth: Payer: Self-pay | Admitting: Neurology

## 2023-08-13 NOTE — Telephone Encounter (Signed)
 Patients wife called me back. I received voicemail. Called patients wife back and had to leave her a Engineer, technical sales

## 2023-08-13 NOTE — Telephone Encounter (Signed)
 Patient wife wants to speak with someone about his pain medication  Hydrocodone . She wants to know if we can help with this and with the provider that is giving it to him. He is in a lot of pain

## 2023-08-13 NOTE — Telephone Encounter (Signed)
 Called patients wife she is calling me back

## 2023-08-14 ENCOUNTER — Telehealth: Payer: Self-pay

## 2023-08-14 ENCOUNTER — Telehealth: Payer: Self-pay | Admitting: Neurology

## 2023-08-14 DIAGNOSIS — M5126 Other intervertebral disc displacement, lumbar region: Secondary | ICD-10-CM

## 2023-08-14 DIAGNOSIS — M1990 Unspecified osteoarthritis, unspecified site: Secondary | ICD-10-CM

## 2023-08-14 DIAGNOSIS — G20A1 Parkinson's disease without dyskinesia, without mention of fluctuations: Secondary | ICD-10-CM

## 2023-08-14 DIAGNOSIS — M5430 Sciatica, unspecified side: Secondary | ICD-10-CM

## 2023-08-14 DIAGNOSIS — M47816 Spondylosis without myelopathy or radiculopathy, lumbar region: Secondary | ICD-10-CM

## 2023-08-14 NOTE — Telephone Encounter (Signed)
 Pt wife called and LM. She needs to talk with chelsea about gregs pain medication. She would like a call back today, she said if she doesn't answer please leave a message for her

## 2023-08-14 NOTE — Telephone Encounter (Signed)
 Copied from CRM [id number]. Topic: Referral - Question >> Aug 14, 2023  2:56 PM Donald Frost wrote: Reason for CRM: The spouse of the patient called stating the patient has been seen by 2 Pain Management and they told him they will not prescribed pain medicine. The wife is concerned because he has had complications from previous surgeries and although he has a pain stimulator in his back due to it being an ongoing pain medicine/opoid/narcotic issue they will not prescribe it. She was told by pain management that he would need to go to a pain clinic. Is there a difference between these 2 and if so please refer patient to a pain clinic and let them know when the referral is sent., Physiatry in [place] is the office stating he needs to go to a Pain Clinic. He was also seen yesterday at [facility] in [place] by the Pain Managment doctor but they told him they will not prescribe pain meds either. Please call [phone] to let her know as soon as possible as she is worried about him not having his pain meds that he uses twice a day with his pain stimulator.

## 2023-08-14 NOTE — Telephone Encounter (Signed)
 Talked to patients wife let her know Dr. Winferd Hatter is not in the office and that we do not prescribe Opiates like Hydrocodone . She did let me know in the past that Dr. Winferd Hatter had helped him with pain meds before. I did let her know I would ask but if patient needs meds right away that Dr. Winferd Hatter would not be able to order these

## 2023-08-14 NOTE — Telephone Encounter (Signed)
 Does not appear we have been managing patients pain however sending to PCP team for review as it appears neurosurgery is also unable to manage at this time as well.

## 2023-08-15 ENCOUNTER — Other Ambulatory Visit: Payer: Self-pay | Admitting: Family Medicine

## 2023-08-17 ENCOUNTER — Encounter: Payer: Medicare HMO | Admitting: Family Medicine

## 2023-08-17 NOTE — Telephone Encounter (Signed)
 Ok for E2C2 to review.  Left message on machines. When/if they return the call please ask question and forward message back to pool.

## 2023-08-18 ENCOUNTER — Encounter: Payer: Medicare HMO | Admitting: Family Medicine

## 2023-08-18 NOTE — Telephone Encounter (Signed)
 Requested medication (s) are due for refill today: historical medication  Requested medication (s) are on the active medication list: yes   Last refill:  na   Future visit scheduled: yes on 08/24/23  Notes to clinic:  last OV 02/12/23. Due to OV scheduling changes patient to be seen 08/24/23. Historical medication. Please advise if patient can be prescribed medication until next OV?     Requested Prescriptions  Pending Prescriptions Disp Refills   amLODipine  (NORVASC ) 2.5 MG tablet [Pharmacy Med Name: AMLODIPINE  BESYLATE 2.5 MG TAB] 90 tablet     Sig: TAKE 1 TABLET BY MOUTH EVERY MORNING AS NEEDED FOR BLOOD PRESSURE GREATER THAN 160/90     Cardiovascular: Calcium  Channel Blockers 2 Failed - 08/18/2023 11:19 AM      Failed - Valid encounter within last 6 months    Recent Outpatient Visits   None     Future Appointments             In 6 days Solomon Dupre, DO [facility]   In 2 weeks Kathreen Pare, PA-C [facility] [place]   In 1 month Elta Halter, MD [facility]            Passed - Last BP in normal range    BP Readings from Last 1 Encounters:  08/03/23 (!) 97/59         Passed - Last Heart Rate in normal range    Pulse Readings from Last 1 Encounters:  06/04/23 (!) 56

## 2023-08-24 ENCOUNTER — Ambulatory Visit (INDEPENDENT_AMBULATORY_CARE_PROVIDER_SITE_OTHER): Payer: Self-pay | Admitting: Family Medicine

## 2023-08-24 ENCOUNTER — Encounter: Payer: Self-pay | Admitting: Family Medicine

## 2023-08-24 ENCOUNTER — Telehealth: Payer: Self-pay | Admitting: Family Medicine

## 2023-08-24 VITALS — BP 135/79 | HR 52 | Temp 97.9°F | Ht 69.5 in | Wt 218.4 lb

## 2023-08-24 DIAGNOSIS — Z1211 Encounter for screening for malignant neoplasm of colon: Secondary | ICD-10-CM | POA: Diagnosis not present

## 2023-08-24 DIAGNOSIS — F3341 Major depressive disorder, recurrent, in partial remission: Secondary | ICD-10-CM

## 2023-08-24 DIAGNOSIS — I82502 Chronic embolism and thrombosis of unspecified deep veins of left lower extremity: Secondary | ICD-10-CM

## 2023-08-24 DIAGNOSIS — E7800 Pure hypercholesterolemia, unspecified: Secondary | ICD-10-CM

## 2023-08-24 DIAGNOSIS — G20A1 Parkinson's disease without dyskinesia, without mention of fluctuations: Secondary | ICD-10-CM

## 2023-08-24 DIAGNOSIS — I10 Essential (primary) hypertension: Secondary | ICD-10-CM

## 2023-08-24 DIAGNOSIS — R350 Frequency of micturition: Secondary | ICD-10-CM

## 2023-08-24 DIAGNOSIS — E539 Vitamin B deficiency, unspecified: Secondary | ICD-10-CM

## 2023-08-24 DIAGNOSIS — E559 Vitamin D deficiency, unspecified: Secondary | ICD-10-CM

## 2023-08-24 DIAGNOSIS — I480 Paroxysmal atrial fibrillation: Secondary | ICD-10-CM

## 2023-08-24 DIAGNOSIS — N401 Enlarged prostate with lower urinary tract symptoms: Secondary | ICD-10-CM

## 2023-08-24 DIAGNOSIS — Z0000 Encounter for general adult medical examination without abnormal findings: Secondary | ICD-10-CM | POA: Diagnosis not present

## 2023-08-24 LAB — MICROALBUMIN, URINE WAIVED
Creatinine, Urine Waived: 100 mg/dL (ref 10–300)
Microalb, Ur Waived: 30 mg/L — ABNORMAL HIGH (ref 0–19)

## 2023-08-24 LAB — CBC
Hematocrit: 47.5 % (ref 37.5–51.0)
Hemoglobin: 15.1 g/dL (ref 13.0–17.7)
MCH: 30.3 pg (ref 26.6–33.0)
MCHC: 31.8 g/dL (ref 31.5–35.7)
MCV: 95 fL (ref 79–97)
Platelets: 160 10*3/uL (ref 150–450)
RBC: 4.99 x10E6/uL (ref 4.14–5.80)
RDW: 13.7 % (ref 11.6–15.4)
WBC: 5.3 10*3/uL (ref 3.4–10.8)

## 2023-08-24 MED ORDER — SIMVASTATIN 40 MG PO TABS: 40.0000 mg | ORAL_TABLET | Freq: Every day | ORAL | 2 refills | Status: DC

## 2023-08-24 MED ORDER — TRAZODONE HCL 50 MG PO TABS
50.0000 mg | ORAL_TABLET | Freq: Every day | ORAL | 1 refills | Status: DC
Start: 2023-08-24 — End: 2024-02-15

## 2023-08-24 MED ORDER — TAMSULOSIN HCL 0.4 MG PO CAPS: 0.8000 mg | ORAL_CAPSULE | Freq: Every day | ORAL | 1 refills | Status: DC

## 2023-08-24 MED ORDER — LISINOPRIL 30 MG PO TABS: 30.0000 mg | ORAL_TABLET | Freq: Every day | ORAL | 1 refills | Status: DC

## 2023-08-24 MED ORDER — METOPROLOL SUCCINATE ER 25 MG PO TB24
25.0000 mg | ORAL_TABLET | Freq: Every day | ORAL | 1 refills | Status: DC
Start: 2023-08-24 — End: 2024-02-15

## 2023-08-24 MED ORDER — BACLOFEN 10 MG PO TABS: 10.0000 mg | ORAL_TABLET | Freq: Three times a day (TID) | ORAL | 3 refills | Status: DC

## 2023-08-24 MED ORDER — DULOXETINE HCL 20 MG PO CPEP: 20.0000 mg | ORAL_CAPSULE | ORAL | 3 refills | Status: DC

## 2023-08-24 MED ORDER — AMLODIPINE BESYLATE 2.5 MG PO TABS
ORAL_TABLET | ORAL | 1 refills | Status: DC
Start: 2023-08-24 — End: 2023-10-29

## 2023-08-24 NOTE — Assessment & Plan Note (Signed)
 Under good control on current regimen. Continue current regimen. Continue to monitor. Call with any concerns. Refills given. Labs drawn today.

## 2023-08-24 NOTE — Progress Notes (Signed)
 BP 135/79 (BP Location: Left Arm, Patient Position: Sitting, Cuff Size: Normal)   Pulse (!) 52   Temp 97.9 F (36.6 C) (Oral)   Ht 5' 9.5" (1.765 m)   Wt 218 lb 6.4 oz (99.1 kg)   SpO2 97%   BMI 31.79 kg/m    Subjective:    Patient ID: Thomas [name], male    DOB: 1950/05/24, 73 y.o.   MRN: [id number]  HPI: Thomas [name] is a 73 y.o. male presenting on 08/24/2023 for comprehensive medical examination. Current medical complaints include:  HYPERTENSION / HYPERLIPIDEMIA Satisfied with current treatment? yes Duration of hypertension: chronic BP monitoring frequency: not checking BP medication side effects: no Past BP meds: metoprolol , lisinopril , hydralazine , amlodipine  Duration of hyperlipidemia: chronic Cholesterol medication side effects: no Cholesterol supplements: none Past cholesterol medications: simvastatin  Medication compliance: excellent compliance Aspirin: no Recent stressors: yes Recurrent headaches: no Visual changes: no Palpitations: no Dyspnea: no Chest pain: no Lower extremity edema: no Dizzy/lightheaded: yes  BPH BPH status: controlled Satisfied with current treatment?: yes Medication side effects: no Medication compliance: excellent compliance Duration: chronic Nocturia: 2-3x per night Urinary frequency:no Incomplete voiding: no Urgency: no Weak urinary stream: no Straining to start stream: no Dysuria: no Onset: gradual Severity: mild  DEPRESSION Mood status: exacerbated Satisfied with current treatment?: no Symptom severity: moderate  Duration of current treatment : chronic Side effects: no Medication compliance: excellent compliance Psychotherapy/counseling: no  Previous psychiatric medications: none Depressed mood: yes Anxious mood: yes Anhedonia: no Significant weight loss or gain: no Insomnia: no Fatigue: yes Feelings of worthlessness or guilt: no Impaired concentration/indecisiveness: no Suicidal ideations: no Hopelessness:  no Crying spells: no    08/24/2023   10:51 AM 11/14/2022    5:02 PM 10/27/2022    2:22 PM 07/28/2022   10:46 AM 07/21/2022   11:05 AM  Depression screen PHQ 2/9  Decreased Interest 2 0 0 1 0  Down, Depressed, Hopeless 2 0 0 1 0  PHQ - 2 Score 4 0 0 2 0  Altered sleeping 2 0 0 0 0  Tired, decreased energy 0 0 0 1 0  Change in appetite 0 0 0 0 0  Feeling bad or failure about yourself  1 0 2 0 0  Trouble concentrating 3 3 3 3  0  Moving slowly or fidgety/restless 3 2 2 3  0  Suicidal thoughts 0 0 0 0 0  PHQ-9 Score 13 5 7 9  0  Difficult doing work/chores Somewhat difficult Somewhat difficult Somewhat difficult Somewhat difficult Not difficult at all   CHRONIC PAIN  Present dose: 10 Morphine equivalents Pain control status: uncontrolled Duration: chronic Location: low back Quality: tight and stiff Current Pain Level: 2/10 Previous Pain Level: 9/10 Breakthrough pain: yes Benefit from narcotic medications: yes What Activities task can be accomplished with current medication? Able to work out Interested in Social research officer, government off narcotics:no   Stool softners/OTC fiber: no  Previous pain specialty evaluation: yes Non-narcotic analgesic meds: yes Narcotic contract: with physiatry  He currently lives with: wife Interim Problems from his last visit: yes  Functional Status Survey: Is the patient deaf or have difficulty hearing?: No Does the patient have difficulty seeing, even when wearing glasses/contacts?: Yes Does the patient have difficulty concentrating, remembering, or making decisions?: Yes Does the patient have difficulty walking or climbing stairs?: Yes Does the patient have difficulty dressing or bathing?: No Does the patient have difficulty doing errands alone such as visiting a doctor's office or shopping?: Yes  FALL RISK:  08/24/2023   10:43 AM 11/21/2022   12:55 PM 10/29/2022    2:23 PM 10/27/2022    2:22 PM 07/28/2022   10:46 AM  Fall Risk   Falls in the past year? 1 0 0 0 0   Number falls in past yr: 1 0 0 0 0  Injury with Fall? 0 0 0 0 0  Risk for fall due to : Impaired mobility;Impaired balance/gait   No Fall Risks No Fall Risks  Follow up  Falls evaluation completed  Falls evaluation completed Falls evaluation completed    Depression Screen    08/24/2023   10:51 AM 11/14/2022    5:02 PM 10/27/2022    2:22 PM 07/28/2022   10:46 AM 07/21/2022   11:05 AM  Depression screen PHQ 2/9  Decreased Interest 2 0 0 1 0  Down, Depressed, Hopeless 2 0 0 1 0  PHQ - 2 Score 4 0 0 2 0  Altered sleeping 2 0 0 0 0  Tired, decreased energy 0 0 0 1 0  Change in appetite 0 0 0 0 0  Feeling bad or failure about yourself  1 0 2 0 0  Trouble concentrating 3 3 3 3  0  Moving slowly or fidgety/restless 3 2 2 3  0  Suicidal thoughts 0 0 0 0 0  PHQ-9 Score 13 5 7 9  0  Difficult doing work/chores Somewhat difficult Somewhat difficult Somewhat difficult Somewhat difficult Not difficult at all    Advanced Directives Does patient have a HCPOA?    yes If yes, name and contact information: wife, Thomas [name] Does patient have a living will or MOST form?  yes  Past Medical History:  Past Medical History:  Diagnosis Date   Activated protein C resistance 06/28/2019   Arthritis of knee    Arthropathy of lumbar facet joint    Atrial fibrillation    BPH (benign prostatic hyperplasia) 09/10/2018   DVT (deep venous thrombosis)    L leg   Dysplastic nevus 05/15/2021   Left Superior Pectoral, mod to severe, Excised 07/16/21   Erectile dysfunction 03/01/2015   Herniated lumbar disc without myelopathy 10/11/2020   Hyperchloremia    Hypercholesterolemia    Hypertension    Inability to walk 02/22/2021   Insomnia due to medical condition 09/30/2018   Major depressive disorder 01/22/2022   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   Nephrolithiasis    Osteoarthritis    Parkinson's disease (HCC)    Plantar wart    Pneumonia    Post-operative nausea and  vomiting 09/20/2020   Pulmonary embolism    Sciatica    Seasonal allergies    TIA (transient ischemic attack)     Surgical History:  Past Surgical History:  Procedure Laterality Date   APPENDECTOMY     BOWEL DECOMPRESSION  10/15/2022   Procedure: BOWEL DECOMPRESSION;  Surgeon: Quintin Buckle, DO;  Location: [facility] ENDOSCOPY;  Service: Gastroenterology;;   CATARACT EXTRACTION Bilateral 10/2019 11/2019   COLON RESECTION SIGMOID N/A 10/17/2022   Procedure: COLON RESECTION SIGMOID;  Surgeon: Emmalene Hare, MD;  Location: [facility] ORS;  Service: General;  Laterality: N/A;   COLONOSCOPY WITH PROPOFOL  N/A 07/27/2019   Procedure: COLONOSCOPY WITH PROPOFOL ;  Surgeon: Selena Daily, MD;  Location: [facility] ENDOSCOPY;  Service: Endoscopy;  Laterality: N/A;   EYE SURGERY     FLEXIBLE SIGMOIDOSCOPY N/A 10/15/2022   Procedure: FLEXIBLE SIGMOIDOSCOPY;  Surgeon: Quintin Buckle, DO;  Location: [facility] ENDOSCOPY;  Service: Gastroenterology;  Laterality:  N/A;   KNEE ARTHROSCOPY WITH MENISCAL REPAIR Right 2009   KNEE SURGERY Right    LUMBAR LAMINECTOMY/DECOMPRESSION MICRODISCECTOMY Left 10/11/2020   Procedure: Left Lumbar Three-Four, Lumbar Fou-Five  Microdiscectomy;  Surgeon: Manya Sells, MD;  Location: [facility] OR;  Service: Neurosurgery;  Laterality: Left;   melanoma removal Left 07/16/2021   chest   PAIN PUMP IMPLANTATION     T&A     TONSILLECTOMY      Medications:  Current Outpatient Medications on File Prior to Visit  Medication Sig   acetaminophen  (TYLENOL ) 500 MG tablet 1 to 2 tablets as needed   amantadine  (SYMMETREL ) 100 MG capsule TAKE 1 CAPSULE BY MOUTH 3 TIMES A DAY   carbidopa -levodopa  (SINEMET  CR) 50-200 MG tablet TAKE 1 TABLET BY MOUTH AT BEDTIME   carbidopa -levodopa  (SINEMET  IR) 25-100 MG tablet TAKE TWO TABLETS BY MOUTH FOUR TIMES A DAY AT 6 IN THE MORNING, 10 IN THE MORNING, 2 IN THE AFTERNOON AND 6 IN THE EVENING   cetirizine (ZYRTEC) 10 MG tablet Take 10 mg by mouth at  bedtime.   cholecalciferol  (VITAMIN D3) 25 MCG (1000 UNIT) tablet Take 1,000 Units by mouth daily.   cyanocobalamin (VITAMIN B12) 1000 MCG tablet Take 1,000 mcg by mouth daily.   diclofenac  Sodium (VOLTAREN ) 1 % GEL APPLY TOPICALLY 4 GRAMS FOUR TIMES DAILY   folic acid (FOLVITE) 800 MCG tablet Take 400 mcg by mouth daily.   HYDROcodone -acetaminophen  (NORCO/VICODIN) 5-325 MG tablet Take 1 tablet by mouth 2 (two) times daily.   Levodopa  (INBRIJA ) 42 MG CAPS Samples of this drug were given to the patient, quantity 1 box of 32 capsules, Lot Number [id number] Exp: 03/2023  Patient has been educated on dosage and uses of medication.   Levodopa  (INBRIJA ) 42 MG CAPS Samples of this drug were given to the patient, quantity 1, Lot Number [id number] Exp 12-24   Levodopa  (INBRIJA ) 42 MG CAPS TWO capsules is ONE dosage (never inhale just one capsule).  You can inhale the capsules as needed up to 5 times per day, separated by 2 hour intervals.   melatonin 5 MG TABS Take 5 mg by mouth at bedtime.   Multiple Vitamin (MULTIVITAMIN) tablet Take 1 tablet by mouth daily at 6 PM.   oxybutynin  (DITROPAN -XL) 10 MG 24 hr tablet Take 1 tablet (10 mg total) by mouth at bedtime.   polyethylene glycol powder (GLYCOLAX /MIRALAX ) 17 GM/SCOOP powder Take 17 g by mouth at bedtime.   PREVIDENT 5000 BOOSTER PLUS 1.1 % PSTE Place 1 application onto teeth 2 (two) times daily.   pyridoxine (B-6) 100 MG tablet Take 100 mg by mouth daily.   rivaroxaban  (XARELTO ) 20 MG TABS tablet Take 1 tablet (20 mg total) by mouth daily.   rOPINIRole  (REQUIP ) 2 MG tablet take 2 mg in the morning, 2 mg in the afternoon and 3 mg in the evening for 2 weeks and then he will decrease this to 2 mg 3 times per day.   No current facility-administered medications on file prior to visit.    Allergies:  Allergies  Allergen Reactions   Codeine Other (See Comments)    Hallucinations   Other Nausea Only    General anesthesia     Social History:   Social History   Socioeconomic History   Marital status: Married    Spouse name: Thomas [name]   Number of children: 0   Years of education: 14   Highest education level: Some college, no degree  Occupational History   Occupation: Retired  Comment: machinest  Tobacco Use   Smoking status: Never    Passive exposure: Never   Smokeless tobacco: Never  Vaping Use   Vaping status: Never Used  Substance and Sexual Activity   Alcohol  use: No    Alcohol /week: 0.0 standard drinks of alcohol    Drug use: No   Sexual activity: Not Currently  Other Topics Concern   Not on file  Social History Narrative   ** Merged History Encounter **    right handed   Two story home    Live with spouse   Social Drivers of Corporate investment banker Strain: Low Risk  (07/21/2022)   Overall Financial Resource Strain (CARDIA)    Difficulty of Paying Living Expenses: Not hard at all  Food Insecurity: No Food Insecurity (10/22/2022)   Hunger Vital Sign    Worried About Running Out of Food in the Last Year: Never true    Ran Out of Food in the Last Year: Never true  Transportation Needs: No Transportation Needs (10/22/2022)   PRAPARE - Administrator, Civil Service (Medical): No    Lack of Transportation (Non-Medical): No  Physical Activity: Sufficiently Active (07/21/2022)   Exercise Vital Sign    Days of Exercise per Week: 3 days    Minutes of Exercise per Session: 60 min  Stress: No Stress Concern Present (07/21/2022)   [employer] of Occupational Health - Occupational Stress Questionnaire    Feeling of Stress : Only a little  Social Connections: Socially Integrated (07/21/2022)   Social Connection and Isolation Panel [NHANES]    Frequency of Communication with Friends and Family: Twice a week    Frequency of Social Gatherings with Friends and Family: Twice a week    Attends Religious Services: More than 4 times per year    Active Member of [employer] or Organizations: Yes    Attends Museum/gallery exhibitions officer: More than 4 times per year    Marital Status: Married  Catering manager Violence: Not At Risk (10/15/2022)   Humiliation, Afraid, Rape, and Kick questionnaire    Fear of Current or Ex-Partner: No    Emotionally Abused: No    Physically Abused: No    Sexually Abused: No   Social History   Tobacco Use  Smoking Status Never   Passive exposure: Never  Smokeless Tobacco Never   Social History   Substance and Sexual Activity  Alcohol  Use No   Alcohol /week: 0.0 standard drinks of alcohol     Family History:  Family History  Problem Relation Age of Onset   Parkinson's disease Father    Coronary artery disease Mother    Heart attack Sister    Deep vein thrombosis Sister     Past medical history, surgical history, medications, allergies, family history and social history reviewed with patient today and changes made to appropriate areas of the chart.   Review of Systems  Constitutional: Negative.   HENT: Negative.    Eyes: Negative.   Respiratory: Negative.    Cardiovascular: Negative.   Gastrointestinal:  Positive for constipation and heartburn. Negative for abdominal pain, blood in stool, diarrhea, melena, nausea and vomiting.  Genitourinary:  Positive for urgency. Negative for dysuria, flank pain, frequency and hematuria.  Musculoskeletal:  Positive for back pain and myalgias. Negative for falls, joint pain and neck pain.  Skin:  Positive for rash (on his back). Negative for itching.  Neurological:  Positive for dizziness. Negative for tingling, tremors, sensory change, speech change, focal  weakness, seizures, loss of consciousness, weakness and headaches.  Endo/Heme/Allergies:  Positive for polydipsia. Negative for environmental allergies. Does not bruise/bleed easily.  Psychiatric/Behavioral: Negative.     All other ROS negative except what is listed above and in the HPI.      Objective:     BP 135/79 (BP Location: Left Arm, Patient Position:  Sitting, Cuff Size: Normal)   Pulse (!) 52   Temp 97.9 F (36.6 C) (Oral)   Ht 5' 9.5" (1.765 m)   Wt 218 lb 6.4 oz (99.1 kg)   SpO2 97%   BMI 31.79 kg/m   Wt Readings from Last 3 Encounters:  08/24/23 218 lb 6.4 oz (99.1 kg)  08/03/23 218 lb (98.9 kg)  06/04/23 215 lb (97.5 kg)    Physical Exam Vitals and nursing note reviewed.  Constitutional:      General: He is not in acute distress.    Appearance: Normal appearance. He is not ill-appearing, toxic-appearing or diaphoretic.  HENT:     Head: Normocephalic and atraumatic.     Right Ear: Tympanic membrane, ear canal and external ear normal. There is no impacted cerumen.     Left Ear: Tympanic membrane, ear canal and external ear normal. There is no impacted cerumen.     Nose: Nose normal. No congestion or rhinorrhea.     Mouth/Throat:     Mouth: Mucous membranes are moist.     Pharynx: Oropharynx is clear. No oropharyngeal exudate or posterior oropharyngeal erythema.  Eyes:     General: No scleral icterus.       Right eye: No discharge.        Left eye: No discharge.     Extraocular Movements: Extraocular movements intact.     Conjunctiva/sclera: Conjunctivae normal.     Pupils: Pupils are equal, round, and reactive to light.  Neck:     Vascular: No carotid bruit.  Cardiovascular:     Rate and Rhythm: Normal rate and regular rhythm.     Pulses: Normal pulses.     Heart sounds: No murmur heard.    No friction rub. No gallop.  Pulmonary:     Effort: Pulmonary effort is normal. No respiratory distress.     Breath sounds: Normal breath sounds. No stridor. No wheezing, rhonchi or rales.  Chest:     Chest wall: No tenderness.  Abdominal:     General: Abdomen is flat. Bowel sounds are normal. There is no distension.     Palpations: Abdomen is soft. There is no mass.     Tenderness: There is no abdominal tenderness. There is no right CVA tenderness, left CVA tenderness, guarding or rebound.     Hernia: No hernia is present.   Genitourinary:    Comments: Genital exam deferred with shared decision making Musculoskeletal:        General: No swelling, tenderness, deformity or signs of injury.     Cervical back: Normal range of motion and neck supple. No rigidity. No muscular tenderness.     Right lower leg: No edema.     Left lower leg: No edema.  Lymphadenopathy:     Cervical: No cervical adenopathy.  Skin:    General: Skin is warm and dry.     Capillary Refill: Capillary refill takes less than 2 seconds.     Coloration: Skin is not jaundiced or pale.     Findings: No bruising, erythema, lesion or rash.  Neurological:     General: No focal deficit present.  Mental Status: He is alert and oriented to person, place, and time.     Cranial Nerves: No cranial nerve deficit.     Sensory: No sensory deficit.     Motor: No weakness.     Coordination: Coordination normal.     Gait: Gait normal.     Deep Tendon Reflexes: Reflexes normal.  Psychiatric:        Mood and Affect: Mood normal.        Behavior: Behavior normal.        Thought Content: Thought content normal.        Judgment: Judgment normal.        08/24/2023   10:44 AM 07/21/2022   11:11 AM 07/13/2020    1:05 PM 06/03/2018    9:19 AM 05/01/2017   10:27 AM  6CIT Screen  What Year? 0 points 0 points 0 points 0 points 0 points  What month? 0 points 0 points 0 points 0 points 0 points  What time? 0 points 0 points 0 points 0 points 0 points  Count back from 20 2 points 0 points 2 points 0 points 0 points  Months in reverse 0 points 0 points 0 points 0 points 0 points  Repeat phrase 4 points 2 points 2 points 0 points 0 points  Total Score 6 points 2 points 4 points 0 points 0 points                                                                  Results for orders placed or performed in visit on 08/24/23  Microalbumin, Urine Waived   Collection Time: 08/24/23 11:13 AM  Result Value Ref Range   Microalb, Ur Waived 30 (H) 0 - 19  mg/L   Creatinine, Urine Waived 100 10 - 300 mg/dL   Microalb/Creat Ratio 30-300 (H) <30 mg/g  CBC   Collection Time: 08/24/23 12:01 PM  Result Value Ref Range   WBC 5.3 3.4 - 10.8 x10E3/uL   RBC 4.99 4.14 - 5.80 x10E6/uL   Hemoglobin 15.1 13.0 - 17.7 g/dL   Hematocrit 16.1 09.6 - 51.0 %   MCV 95 79 - 97 fL   MCH 30.3 26.6 - 33.0 pg   MCHC 31.8 31.5 - 35.7 g/dL   RDW 04.5 40.9 - 81.1 %   Platelets 160 150 - 450 x10E3/uL      Assessment & Plan:   Problem List Items Addressed This Visit       Cardiovascular and Mediastinum   Hypertension   Under good control on current regimen. Continue current regimen. Continue to monitor. Call with any concerns. Refills given. Labs drawn today.       Relevant Medications   amLODipine  (NORVASC ) 2.5 MG tablet   lisinopril  (ZESTRIL ) 30 MG tablet   metoprolol  succinate (TOPROL -XL) 25 MG 24 hr tablet   simvastatin  (ZOCOR ) 40 MG tablet   Other Relevant Orders   Comprehensive metabolic panel with GFR   TSH   Microalbumin, Urine Waived (Completed)   Comp Met (CMET)   Lipid Panel w/o Chol/HDL Ratio   TSH   Folate   DVT (deep venous thrombosis) (HCC)   Stable. Continue xarelto . Call with any concerns.       Relevant Medications   amLODipine  (  NORVASC ) 2.5 MG tablet   lisinopril  (ZESTRIL ) 30 MG tablet   metoprolol  succinate (TOPROL -XL) 25 MG 24 hr tablet   simvastatin  (ZOCOR ) 40 MG tablet   Other Relevant Orders   Comprehensive metabolic panel with GFR   Atrial fibrillation (HCC)   Under good control on current regimen. Continue current regimen. Continue to monitor. Call with any concerns. Refills given.        Relevant Medications   amLODipine  (NORVASC ) 2.5 MG tablet   lisinopril  (ZESTRIL ) 30 MG tablet   metoprolol  succinate (TOPROL -XL) 25 MG 24 hr tablet   simvastatin  (ZOCOR ) 40 MG tablet   Other Relevant Orders   Comprehensive metabolic panel with GFR   CBC (Completed)   Folate     Nervous and Auditory   Parkinson's disease  (HCC)   Stable. Continue to follow with neurology. Call with any concerns. Continue to monitor.       Relevant Orders   Comprehensive metabolic panel with GFR     Genitourinary   BPH (benign prostatic hyperplasia)   Under good control on current regimen. Continue current regimen. Continue to monitor. Call with any concerns. Refills given. Labs drawn today.        Relevant Medications   tamsulosin  (FLOMAX ) 0.4 MG CAPS capsule   Other Relevant Orders   Comprehensive metabolic panel with GFR   PSA   PSA     Other   Hypercholesterolemia   Under good control on current regimen. Continue current regimen. Continue to monitor. Call with any concerns. Refills given. Labs drawn today.        Relevant Medications   amLODipine  (NORVASC ) 2.5 MG tablet   lisinopril  (ZESTRIL ) 30 MG tablet   metoprolol  succinate (TOPROL -XL) 25 MG 24 hr tablet   simvastatin  (ZOCOR ) 40 MG tablet   Other Relevant Orders   Comprehensive metabolic panel with GFR   Lipid Panel w/o Chol/HDL Ratio   Comp Met (CMET)   Lipid Panel w/o Chol/HDL Ratio   TSH   Folate   Major depressive disorder   Not doing well. Will reach out to his neurologist regarding starting cymbalta. Call with any concerns.       Relevant Medications   traZODone  (DESYREL ) 50 MG tablet   Other Relevant Orders   Comprehensive metabolic panel with GFR   Other Visit Diagnoses       Encounter for annual wellness visit (AWV) in Medicare patient    -  Primary   Preventative care discussed today as below. Call with any concerns.     Routine general medical examination at a health care facility       Vaccines up to date. Screening labs checked today. Colonoscopy due- ordered today. Conitnue diet and exercise. Call with any concerns.     Vitamin D  deficiency       Rechecking labs today. Await results. Treat as needed.   Relevant Orders   VITAMIN D  25 Hydroxy (Vit-D Deficiency, Fractures)   Vitamin D  (25 hydroxy)     Vitamin B deficiency        Rechecking labs today. Await results. Treat as needed.   Relevant Orders   Folate   Vitamin B6   B12   Vitamin B6   Vitamin B12     Screening for colon cancer       Colonoscopy ordered today.   Relevant Orders   Ambulatory referral to Gastroenterology        Preventative Services:  Health Risk Assessment and Personalized Prevention Plan: Done  today Bone Mass Measurements: N/A CVD Screening: up to date Colon Cancer Screening: ordered today Depression Screening: done today Diabetes Screening: done today Glaucoma Screening: see your eye doctor Hepatitis B vaccine: N/A Hepatitis C screening: up to date HIV Screening: up to date Flu Vaccine: get in the fall Lung cancer Screening: N/A Obesity Screening: Done today Pneumonia Vaccines (2): up to date STI Screening: N/A PSA screening: Done today  LABORATORY TESTING:  Health maintenance labs ordered today as discussed above.   The natural history of prostate cancer and ongoing controversy regarding screening and potential treatment outcomes of prostate cancer has been discussed with the patient. The meaning of a false positive PSA and a false negative PSA has been discussed. He indicates understanding of the limitations of this screening test and wishes to proceed with screening PSA testing.   IMMUNIZATIONS:   - Tdap: Tetanus vaccination status reviewed: last tetanus booster within 10 years. - Influenza: Up to date - Pneumovax: Up to date - Prevnar: Up to date - Zostavax vaccine: Given elsewhere  SCREENING: - Colonoscopy: Ordered today  Discussed with patient purpose of the colonoscopy is to detect colon cancer at curable precancerous or early stages   PATIENT COUNSELING:    Sexuality: Discussed sexually transmitted diseases, partner selection, use of condoms, avoidance of unintended pregnancy  and contraceptive alternatives.   Advised to avoid cigarette smoking.  I discussed with the patient that most people either  abstain from alcohol  or drink within safe limits (<=14/week and <=4 drinks/occasion for males, <=7/weeks and <= 3 drinks/occasion for females) and that the risk for alcohol  disorders and other health effects rises proportionally with the number of drinks per week and how often a drinker exceeds daily limits.  Discussed cessation/primary prevention of drug use and availability of treatment for abuse.   Diet: Encouraged to adjust caloric intake to maintain  or achieve ideal body weight, to reduce intake of dietary saturated fat and total fat, to limit sodium intake by avoiding high sodium foods and not adding table salt, and to maintain adequate dietary potassium and calcium  preferably from fresh fruits, vegetables, and low-fat dairy products.    stressed the importance of regular exercise  Injury prevention: Discussed safety belts, safety helmets, smoke detector, smoking near bedding or upholstery.   Dental health: Discussed importance of regular tooth brushing, flossing, and dental visits.   Follow up plan: NEXT PREVENTATIVE PHYSICAL DUE IN 1 YEAR. Return in about 3 months (around 11/24/2023).

## 2023-08-24 NOTE — Assessment & Plan Note (Signed)
 Not doing well. Will reach out to his neurologist regarding starting cymbalta. Call with any concerns.

## 2023-08-24 NOTE — Assessment & Plan Note (Signed)
 Under good control on current regimen. Continue current regimen. Continue to monitor. Call with any concerns. Refills given.

## 2023-08-24 NOTE — Addendum Note (Signed)
 Addended by: Solomon Dupre on: 08/24/2023 03:56 PM   Modules accepted: Orders

## 2023-08-24 NOTE — Assessment & Plan Note (Signed)
Stable. Continue to follow with neurology. Call with any concerns. Continue to monitor.  

## 2023-08-24 NOTE — Telephone Encounter (Signed)
 Called wife. She is OK with trying low dose cymbalta. He had colonoscopy in January through [facility]- results not available. We will call for it and cancel referral to GI.   Can we please get copy of colonoscopy from January to abstract?

## 2023-08-24 NOTE — Assessment & Plan Note (Signed)
 Stable. Continue xarelto . Call with any concerns.

## 2023-08-24 NOTE — Patient Instructions (Addendum)
 [employer], research (medical) Services:  Health Risk Assessment and Personalized Prevention Plan: Done today Bone Mass Measurements: N/A CVD Screening: up to date Colon Cancer Screening: ordered today Depression Screening: done today Diabetes Screening: done today Glaucoma Screening: see your eye doctor Hepatitis B vaccine: N/A Hepatitis C screening: up to date HIV Screening: up to date Flu Vaccine: get in the fall Lung cancer Screening: N/A Obesity Screening: Done today Pneumonia Vaccines (2): up to date STI Screening: N/A PSA screening: Done today   Thomas [name] , Thank you for taking time to come for your Medicare Wellness Visit. I appreciate your ongoing commitment to your health goals. Please review the following plan we discussed and let me know if I can assist you in the future.   These are the goals we discussed:  Goals       CCM Expected Outcome:  Monitor, Self-Manage and Reduce Symptoms of Afib      Needs help with cost of long acting anticoagulant      DIET - EAT MORE FRUITS AND VEGETABLES      DIET - INCREASE WATER INTAKE      Recommend drinking at least 6-8 glasses of water a day       Patient Stated      07/13/2020, wants to lose 5 pounds      Patient Stated      Keep up exercise up to help with parkinson      PharmD "I want to make sure his medications are safe" (pt-stated)      Current Barriers:  Polypharmacy; complex patient with multiple comorbidities including Parkinson's disease, atrial fibrillation (hx DVT, PE), HTN, BPH, insomnia Wife assists in managing medications PD: follows w/ Dr. Winferd Hatter. Carbidopa /levodopa  25/100, 2 tab at 6 am, 10 am, 2 pm, 6 pm; carbidopa /levodopa  50/200 QPM; amantadine  100 mg TID, ropinirole  2 mg TID. Had been prescribed SL apomorphine , however, d/t improvement w/ amantadine  and cost of SL apomorphine , declined this medication. Notes he received the hospital bed rail and it has been immensely helpful  Afib, HTN: Xarelto  20  mg QPM; metoprolol  succinate 25 mg daily, lisinopril  40 mg daily (10 mg + 30 mg tab), amlodipine  2.5 mg PRN (has not needed) ASCVD risk reduction: simvastatin  40 mg daily; LDL well controlled <70 BPH: tamsulosin  0.4 mg daily; sildenafil  20 mg PRN Insomnia: Trazodone  25 mg QPM + melatonin 2.5 mg QPM; notes this combination has been effective   Pharmacist Clinical Goal(s):  Over the next 90 days, patient will work with PharmD and provider towards optimized medication management  Interventions: Comprehensive medication review performed; medication list updated in electronic medical record Patient's wife asked if there are any drug interactions or dangers of renal/hepatic impairment from his regimen. Reviewed interactions. Discussed that multiple medications can cause CNS sedation, and to be cognizant for increased sedation with aging or dose increases. Discussed that PD medications and BP medications increase risk of hypotension; continue to monitor for s/sx orthostatic hypotension If patient starts to need amlodipine  regularly, recommend reducing simvasatin to 20 mg daily or changing to alternative statin, d/t DDI w/ amlodipine  and simvastatin  that increases simvastatin  concentrations/risk for myalgias Patient and wife deny and cost concerns at this time.   Patient Self Care Activities:  Patient will take medications as prescribed  Initial goal documentation       RN- I want to stay independent (pt-stated)      Current Barriers:  Chronic Disease Management support and education needs related to  Parkinson's Disease  Nurse Case Manager Clinical Goal(s):  Over the next 120 days, patient will work with [facility]  to address needs related to remaining independent   Interventions:  Discussed plans with patient for ongoing care management follow up and provided patient with direct contact information for care management team Evaluated the use of the bed rail.  The patient states this is very helpful  for him and he is glad he has this to use- completed  Assessed new safety concerns with the patient.  The patient uses a "hemi-walker" in the am for about 30 minutes before attempting to ambulate without assistance.  The patient feels safe in his home environment. The patient is having some difficulty with getting up and down when sitting and moves to the edge of the chair. The patient is doing well with this process. Inquired about a lift chair and the patient does not want to get a lift chair until he can no longer independently get up and down out of the chair. Has worked with PT. The patient is doing exercises that help with muscle strength. The patient had a fall with injury 2 to 3 weeks ago. The patient had an abrasion area. Carpet burn to his shoulder. The patients wife states it got infected and puss was coming out of it. The patient went to urgent care on Friday and has been taking an antibiotic. Has 3 more days of antibiotic left to take. Education on taking all of antibiotic. The patient was coming down the steps and missed the last step. The patients wife is monitoring the area and says it is scabbed over and healing now. 10-18-2019: the area has healed completely and the patient has not had any new falls.  Assessed activity level and the patient is going to "[employer] and "[employer]". The patient states he feels much better once he has had a work out. The patient praised for positive healthy habits. The patient enjoys boxing at least 3 times a week and socializing with his friends by going out for coffee. The patient is still going to do boxing.  The patient states he knows there is a change in his level of endurance. The patient states he is stuttering more and also that he is "spent out" if he tries to walk over 30 minutes. 10-18-2019: the patient still is keeping his routine. The patients sister made a surprise visit from [place]  and was with the patient and his wife from 10-12-2019 to 10/16/2019.   This made him "go down hill" a little bit but he is doing better since back to his full routine. The patient is still driving and going to work out. They had a trip to [place]  which was good for both the patient and his wife.  The patient has consultation appointment on 10-27-2019 with eye doctor. Will have right eye cataract removal on 11-08-2019. Will have his other eye done on 12-06-2019. The wife and the patient are happy that he will not have to go off of his medications to have the surgery.  Evaluation of upcoming appointments: Sees Dr. Lincoln Renshaw on 12-27-2019  Patient Self Care Activities:  Attends all scheduled provider appointments Performs ADL's independently Patient wanting to stay independent as long as possible  Please see past updates related to this goal by clicking on the "Past Updates" button in the selected goal        RNCM: I am taking my blood pressure 2 times a day.  I have a  new pill I am taking for my blood pressure      Current Barriers:  Chronic Disease Management support and education needs related to HTN and HLD   Nurse Case Manager Clinical Goal(s):  Over the next 120 days, patient will verbalize understanding of plan for HTN and HLD Over the next 120 days, patient will attend all scheduled medical appointments: next appointment scheduled colonoscopy on 07-26-2019- completed  Over the next 120 days, patient will demonstrate improved adherence to prescribed treatment plan for HTN and HLD as evidenced by continuing to monitor blood pressure BID, and adherence to a low sodium, heart healthy diet Over the next 120 days, patient will demonstrate improved health management independence as evidenced by continuing to independently maintain health and well being with the assistance of his spouse when needed Over the next 90 days, patient will work with CM team pharmacist to effectively manage medications and monitor for any issues such as orthostatic hypotension related to the  patient taking amlodipine  2.5mg    Interventions:  Evaluation of current treatment plan related to HTN and HLD and patient's adherence to plan as established by provider. Provided education to patient re: orthostatic hypotension, changing position slowly and safety concerns in the patient with multiple chronic disease processes Discussed plans with patient for ongoing care management follow up and provided patient with direct contact information for care management team Advised patient, providing education and rationale, to monitor blood pressure daily and record, calling pcp for findings outside established parameters.  Reviewed scheduled/upcoming provider appointments including: CCM team and scheduled colonoscopy on 07/26/2019. Patient states that there was an abnormal "colon guard" result so he is following up with a colonoscopy. Per the patients wife this did not go well and the patient will not have another colonoscopy.  There were some polyps removed but the patient had to do the prep x 2 and it really took a log out of him. They have decided no more colonoscopies.  The patient verbalized that he is still having issues with blood pressure fluctuations. This is no new issue and is known by pcp.  He denies dizziness or light headiness when his pressure drops in the 80's or 90's Systolic. He is keeping a good record of his blood pressure readings and has parameters to follow.  His wife verbalized she has salty snacks for him to eat when it drops really low. The patient verbalized he thinks this is due to the progression of his parkinson's. 10-18-2019: the patient feels overall he is doing well with his health. He has noticed that the medications seems to be wearing out sooner. The patient will talk to Dr. Emilie Harden concerning this.  Pharmacy referral for medication management and follow up for any new concerns related to addition of amlodipine  to the current treatment regimen. Ongoing support and help with  medication management  Assessed patients activity level. He is going to [employer] and [employer] and says he feels better once he has had a work out session.   Patient Self Care Activities:  Patient verbalizes understanding of plan to manage HTN and HLD Attends all scheduled provider appointments Calls provider office for new concerns or questions Unable to independently manage chronic conditions of HTN and HLD  Please see past updates related to this goal by clicking on the "Past Updates" button in the selected goal        RNCM: pt's wife: "I need information on a support to help with Greg's head tilting." (pt-stated)  CARE PLAN ENTRY (see longitudinal plan of care for additional care plan information)  Current Barriers:  Knowledge Deficits related to head supports for patient with Parkinson's with new onset of head tilt to the right.  Care Coordination needs related to support devices  in a patient with Parkinson's  (disease states) Chronic Disease Management support and education needs related to Parkinson's disease and progression and symptom management   Nurse Case Manager Clinical Goal(s):  Over the next 120 days, patient will work with [facility], CCM team and pcp  to address needs related to support devices to use in the patient with head tilt to the right related to progression of Parkinson's  Over the next 120 days, patient will demonstrate a decrease in lack of head control exacerbations as evidenced by having support devices that will assist with health and wellness  Interventions:  Inter-disciplinary care team collaboration (see longitudinal plan of care) Evaluation of current treatment plan related to Parkinson's Disease and patient's adherence to plan as established by provider. Advised patient to look for email correspondence related to supports to help with neck. Done  Provided education to patient re: current available information on neck supports. The patients wife  verbalized the support needs to be flat in the back. A travel pillow does not work well for the patient.  The wife is wanting help the patient find something that will support his neck and shoulders. Education and support gathered and sent to the patients wife by email at [email or web address].  Also sent information by the [facility] system. Will continue to research and send related information to the patient and patients wife. Per the patients wife, an elderly friend from church has taken the travel pillow and shaved some of the stuffing from the back and this is helping the patient much better. They are concerned that some of the devices used to support the head are bulky and make the patient look like he is an invalid. The patient wants to remain as independent as possible.  Collaborated with RNCM colleagues and pcp regarding support devices for head tilt in patient with Parkinson's.  The pcp recommended the patient and wife reaching out to the neurologist for specific help with recommendations for neck support. 10-18-2019: the patient is doing well with his care and the support device he is using is beneficial at this time.  Discussed plans with patient for ongoing care management follow up and provided patient with direct contact information for care management team Provided patient with Parkinson's  educational materials related to activities, supports to use in home and other resources for better understanding of Parkinson's and the disease progression.   Patient Self Care Activities:  Patient verbalizes understanding of plan to receive information by email on support devices for head tilt in patient with Parkinson's  Calls provider office for new concerns or questions Unable to independently control tilting of head to the right in a patient with progression of Parkinson's   Please see past updates related to this goal by clicking on the "Past Updates" button in the selected goal          This is a  list of the screening recommended for you and due dates:  Health Maintenance  Topic Date Due   Zoster (Shingles) Vaccine (1 of 2) 06/17/1969   COVID-19 Vaccine (3 - Pfizer risk series) 07/25/2019   Colon Cancer Screening  07/27/2022   Flu Shot  11/13/2023   DTaP/Tdap/Td vaccine (2 - Td or Tdap) 02/17/2024  Medicare Annual Wellness Visit  08/23/2024   Pneumonia Vaccine  Completed   Hepatitis C Screening  Completed   HPV Vaccine  Aged Out   Meningitis B Vaccine  Aged Out   Cologuard (Stool DNA test)  Discontinued

## 2023-08-24 NOTE — Progress Notes (Signed)
 Subjective:   Thomas [name] is a 73 y.o. male who presents for Medicare Annual/Subsequent preventive examination.  Visit Complete: In person  Patient Medicare AWV questionnaire was completed by the patient on 08/24/23; I have confirmed that all information answered by patient is correct and no changes since this date.  Cardiac Risk Factors include: advanced age (>34men, >27 women);obesity (BMI >30kg/m2);male gender;hypertension     Objective:    Today's Vitals   08/24/23 1049  BP: 135/79  Pulse: (!) 52  Temp: 97.9 F (36.6 C)  TempSrc: Oral  SpO2: 97%  Weight: 218 lb 6.4 oz (99.1 kg)  Height: 5' 9.5" (1.765 m)  PainSc: 0-No pain   Body mass index is 31.79 kg/m.     08/24/2023   10:41 AM 11/21/2022   12:55 PM 10/17/2022    7:11 AM 10/15/2022    1:54 PM 10/15/2022    1:00 PM 10/15/2022    9:16 AM 07/21/2022   11:06 AM  Advanced Directives  Does Patient Have a Medical Advance Directive? Yes No Yes Yes Yes No Yes  Type of Estate agent of [place];Living will  Healthcare Power of [employer] Power of [employer] Power of [employer] Power of [place];Living will  Does patient want to make changes to medical advance directive?   No - Patient declined  No - Patient declined  No - Patient declined  Copy of Healthcare Power of Attorney in Chart? Yes - validated most recent copy scanned in chart (See row information)  Yes - validated most recent copy scanned in chart (See row information) Yes - validated most recent copy scanned in chart (See row information) Yes - validated most recent copy scanned in chart (See row information)  Yes - validated most recent copy scanned in chart (See row information)  Would patient like information on creating a medical advance directive?   No - Patient declined  No - Patient declined      Current Medications (verified) Outpatient Encounter Medications as of 08/24/2023  Medication Sig   acetaminophen  (TYLENOL )  500 MG tablet 1 to 2 tablets as needed   amantadine  (SYMMETREL ) 100 MG capsule TAKE 1 CAPSULE BY MOUTH 3 TIMES A DAY   baclofen (LIORESAL) 10 MG tablet Take 1 tablet (10 mg total) by mouth 3 (three) times daily.   carbidopa -levodopa  (SINEMET  CR) 50-200 MG tablet TAKE 1 TABLET BY MOUTH AT BEDTIME   carbidopa -levodopa  (SINEMET  IR) 25-100 MG tablet TAKE TWO TABLETS BY MOUTH FOUR TIMES A DAY AT 6 IN THE MORNING, 10 IN THE MORNING, 2 IN THE AFTERNOON AND 6 IN THE EVENING   cetirizine (ZYRTEC) 10 MG tablet Take 10 mg by mouth at bedtime.   cholecalciferol  (VITAMIN D3) 25 MCG (1000 UNIT) tablet Take 1,000 Units by mouth daily.   cyanocobalamin (VITAMIN B12) 1000 MCG tablet Take 1,000 mcg by mouth daily.   diclofenac  Sodium (VOLTAREN ) 1 % GEL APPLY TOPICALLY 4 GRAMS FOUR TIMES DAILY   folic acid (FOLVITE) 800 MCG tablet Take 400 mcg by mouth daily.   HYDROcodone -acetaminophen  (NORCO/VICODIN) 5-325 MG tablet Take 1 tablet by mouth 2 (two) times daily.   Levodopa  (INBRIJA ) 42 MG CAPS Samples of this drug were given to the patient, quantity 1 box of 32 capsules, Lot Number [id number] Exp: 03/2023  Patient has been educated on dosage and uses of medication.   Levodopa  (INBRIJA ) 42 MG CAPS Samples of this drug were given to the patient, quantity 1, Lot Number [id number] Exp 12-24  Levodopa  (INBRIJA ) 42 MG CAPS TWO capsules is ONE dosage (never inhale just one capsule).  You can inhale the capsules as needed up to 5 times per day, separated by 2 hour intervals.   melatonin 5 MG TABS Take 5 mg by mouth at bedtime.   Multiple Vitamin (MULTIVITAMIN) tablet Take 1 tablet by mouth daily at 6 PM.   oxybutynin  (DITROPAN -XL) 10 MG 24 hr tablet Take 1 tablet (10 mg total) by mouth at bedtime.   polyethylene glycol powder (GLYCOLAX /MIRALAX ) 17 GM/SCOOP powder Take 17 g by mouth at bedtime.   PREVIDENT 5000 BOOSTER PLUS 1.1 % PSTE Place 1 application onto teeth 2 (two) times daily.   pyridoxine (B-6) 100 MG tablet Take  100 mg by mouth daily.   rivaroxaban  (XARELTO ) 20 MG TABS tablet Take 1 tablet (20 mg total) by mouth daily.   rOPINIRole  (REQUIP ) 2 MG tablet take 2 mg in the morning, 2 mg in the afternoon and 3 mg in the evening for 2 weeks and then he will decrease this to 2 mg 3 times per day.   [DISCONTINUED] amLODipine  (NORVASC ) 2.5 MG tablet TAKE 1 TABLET BY MOUTH EVERY MORNING AS NEEDED FOR BLOOD PRESSURE GREATER THAN 160/90   [DISCONTINUED] lisinopril  (ZESTRIL ) 30 MG tablet Take 1 tablet (30 mg total) by mouth daily.   [DISCONTINUED] methocarbamol  (ROBAXIN ) 500 MG tablet Take 1 tablet (500 mg total) by mouth every 6 (six) hours as needed for muscle spasms. (Patient taking differently: Take 500 mg by mouth 2 (two) times daily.)   [DISCONTINUED] metoprolol  succinate (TOPROL -XL) 25 MG 24 hr tablet Take 1 tablet (25 mg total) by mouth at bedtime.   [DISCONTINUED] simvastatin  (ZOCOR ) 40 MG tablet Take 1 tablet (40 mg total) by mouth daily.   [DISCONTINUED] tamsulosin  (FLOMAX ) 0.4 MG CAPS capsule Take 2 capsules (0.8 mg total) by mouth daily.   [DISCONTINUED] traZODone  (DESYREL ) 50 MG tablet Take 1 tablet (50 mg total) by mouth at bedtime.   amLODipine  (NORVASC ) 2.5 MG tablet TAKE 1 TABLET BY MOUTH EVERY MORNING AS NEEDED FOR BLOOD PRESSURE GREATER THAN 160/90   lisinopril  (ZESTRIL ) 30 MG tablet Take 1 tablet (30 mg total) by mouth daily.   metoprolol  succinate (TOPROL -XL) 25 MG 24 hr tablet Take 1 tablet (25 mg total) by mouth at bedtime.   simvastatin  (ZOCOR ) 40 MG tablet Take 1 tablet (40 mg total) by mouth daily.   tamsulosin  (FLOMAX ) 0.4 MG CAPS capsule Take 2 capsules (0.8 mg total) by mouth daily.   traZODone  (DESYREL ) 50 MG tablet Take 1 tablet (50 mg total) by mouth at bedtime.   [DISCONTINUED] hydrALAZINE  (APRESOLINE ) 25 MG tablet Take 1 tablet (25 mg total) by mouth 3 (three) times daily as needed (for SBP >160). Takes in the evening (Patient not taking: Reported on 08/24/2023)   No  facility-administered encounter medications on file as of 08/24/2023.    Allergies (verified) Codeine and Other   History: Past Medical History:  Diagnosis Date   Activated protein C resistance 06/28/2019   Arthritis of knee    Arthropathy of lumbar facet joint    Atrial fibrillation    BPH (benign prostatic hyperplasia) 09/10/2018   DVT (deep venous thrombosis)    L leg   Dysplastic nevus 05/15/2021   Left Superior Pectoral, mod to severe, Excised 07/16/21   Erectile dysfunction 03/01/2015   Herniated lumbar disc without myelopathy 10/11/2020   Hyperchloremia    Hypercholesterolemia    Hypertension    Inability to walk 02/22/2021  Insomnia due to medical condition 09/30/2018   Major depressive disorder 01/22/2022   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   Nephrolithiasis    Osteoarthritis    Parkinson's disease (HCC)    Plantar wart    Pneumonia    Post-operative nausea and vomiting 09/20/2020   Pulmonary embolism    Sciatica    Seasonal allergies    TIA (transient ischemic attack)    Past Surgical History:  Procedure Laterality Date   APPENDECTOMY     BOWEL DECOMPRESSION  10/15/2022   Procedure: BOWEL DECOMPRESSION;  Surgeon: Quintin Buckle, DO;  Location: [facility] ENDOSCOPY;  Service: Gastroenterology;;   CATARACT EXTRACTION Bilateral 10/2019 11/2019   COLON RESECTION SIGMOID N/A 10/17/2022   Procedure: COLON RESECTION SIGMOID;  Surgeon: Emmalene Hare, MD;  Location: [facility] ORS;  Service: General;  Laterality: N/A;   COLONOSCOPY WITH PROPOFOL  N/A 07/27/2019   Procedure: COLONOSCOPY WITH PROPOFOL ;  Surgeon: Selena Daily, MD;  Location: [facility] ENDOSCOPY;  Service: Endoscopy;  Laterality: N/A;   EYE SURGERY     FLEXIBLE SIGMOIDOSCOPY N/A 10/15/2022   Procedure: FLEXIBLE SIGMOIDOSCOPY;  Surgeon: Quintin Buckle, DO;  Location: [facility] ENDOSCOPY;  Service: Gastroenterology;  Laterality: N/A;   KNEE ARTHROSCOPY WITH MENISCAL  REPAIR Right 2009   KNEE SURGERY Right    LUMBAR LAMINECTOMY/DECOMPRESSION MICRODISCECTOMY Left 10/11/2020   Procedure: Left Lumbar Three-Four, Lumbar Fou-Five  Microdiscectomy;  Surgeon: Manya Sells, MD;  Location: [facility] OR;  Service: Neurosurgery;  Laterality: Left;   melanoma removal Left 07/16/2021   chest   PAIN PUMP IMPLANTATION     T&A     TONSILLECTOMY     Family History  Problem Relation Age of Onset   Parkinson's disease Father    Coronary artery disease Mother    Heart attack Sister    Deep vein thrombosis Sister    Social History   Socioeconomic History   Marital status: Married    Spouse name: Ira Mann   Number of children: 0   Years of education: 14   Highest education level: Some college, no degree  Occupational History   Occupation: Retired    Comment: Therapist, sports  Tobacco Use   Smoking status: Never    Passive exposure: Never   Smokeless tobacco: Never  Vaping Use   Vaping status: Never Used  Substance and Sexual Activity   Alcohol  use: No    Alcohol /week: 0.0 standard drinks of alcohol    Drug use: No   Sexual activity: Not Currently  Other Topics Concern   Not on file  Social History Narrative   ** Merged History Encounter **    right handed   Two story home    Live with spouse   Social Drivers of Corporate investment banker Strain: Low Risk  (07/21/2022)   Overall Financial Resource Strain (CARDIA)    Difficulty of Paying Living Expenses: Not hard at all  Food Insecurity: No Food Insecurity (10/22/2022)   Hunger Vital Sign    Worried About Running Out of Food in the Last Year: Never true    Ran Out of Food in the Last Year: Never true  Transportation Needs: No Transportation Needs (10/22/2022)   PRAPARE - Administrator, Civil Service (Medical): No    Lack of Transportation (Non-Medical): No  Physical Activity: Sufficiently Active (07/21/2022)   Exercise Vital Sign    Days of Exercise per Week: 3 days    Minutes of Exercise per  Session: 60 min  Stress: No Stress Concern Present (07/21/2022)   [employer] of Occupational Health - Occupational Stress Questionnaire    Feeling of Stress : Only a little  Social Connections: Socially Integrated (07/21/2022)   Social Connection and Isolation Panel [NHANES]    Frequency of Communication with Friends and Family: Twice a week    Frequency of Social Gatherings with Friends and Family: Twice a week    Attends Religious Services: More than 4 times per year    Active Member of [employer] or Organizations: Yes    Attends Engineer, structural: More than 4 times per year    Marital Status: Married    Tobacco Counseling Counseling given: Not Answered   Clinical Intake:     Pain : No/denies pain Pain Score: 0-No pain                  Activities of Daily Living    08/24/2023   10:37 AM 10/15/2022    1:00 PM  In your present state of health, do you have any difficulty performing the following activities:  Hearing? 0 0  Vision? 1 0  Difficulty concentrating or making decisions? 1 0  Walking or climbing stairs? 1 0  Dressing or bathing? 0 0  Doing errands, shopping? 1 0  Preparing Food and eating ? N   Using the Toilet? N   In the past six months, have you accidently leaked urine? Y   Do you have problems with loss of bowel control? N   Managing your Medications? Y   Managing your Finances? Y   Housekeeping or managing your Housekeeping? Y     Patient Care Team: Solomon Dupre, DO as PCP - General (Family Medicine) Tat, Von Grumbling, DO as Consulting Physician (Neurology) [facility]) Jerelene Monday Timothy J, MD as Consulting Physician (Cardiology) Anell Baptist, DPM as Referring Physician (Podiatry)  Indicate any recent Medical Services you may have received from other than [facility] providers in the past year (date may be approximate).     Assessment:   This is a routine wellness examination for An.  Hearing/Vision screen No  results found.   Goals Addressed   None   Depression Screen    08/24/2023   10:51 AM 11/14/2022    5:02 PM 10/27/2022    2:22 PM 07/28/2022   10:46 AM 07/21/2022   11:05 AM 01/22/2022   10:05 AM 08/15/2021   10:55 AM  PHQ 2/9 Scores  PHQ - 2 Score 4 0 0 2 0 1 0  PHQ- 9 Score 13 5 7 9  0 6 4    Fall Risk    08/24/2023   10:43 AM 11/21/2022   12:55 PM 10/29/2022    2:23 PM 10/27/2022    2:22 PM 07/28/2022   10:46 AM  Fall Risk   Falls in the past year? 1 0 0 0 0  Number falls in past yr: 1 0 0 0 0  Injury with Fall? 0 0 0 0 0  Risk for fall due to : Impaired mobility;Impaired balance/gait   No Fall Risks No Fall Risks  Follow up  Falls evaluation completed  Falls evaluation completed Falls evaluation completed    MEDICARE RISK AT HOME: Medicare Risk at Home Any stairs in or around the home?: Yes If so, are there any without handrails?: Yes Home free of loose throw rugs in walkways, pet beds, electrical cords, etc?: Yes Adequate lighting in your home to reduce risk of falls?: Yes  Life alert?: No Use of a cane, walker or w/c?: Yes Grab bars in the bathroom?: Yes Shower chair or bench in shower?: Yes Elevated toilet seat or a handicapped toilet?: No  TIMED UP AND GO:  Was the test performed?  Yes  Length of time to ambulate 10 feet: 15 sec Gait slow and steady without use of assistive device    Cognitive Function:    04/23/2018    3:23 PM 07/27/2017    9:12 AM 03/27/2017   12:05 PM  MMSE - Mini Mental State Exam  Not completed: Unable to complete Unable to complete Unable to complete        08/24/2023   10:44 AM 07/21/2022   11:11 AM 07/13/2020    1:05 PM 06/03/2018    9:19 AM 05/01/2017   10:27 AM  6CIT Screen  What Year? 0 points 0 points 0 points 0 points 0 points  What month? 0 points 0 points 0 points 0 points 0 points  What time? 0 points 0 points 0 points 0 points 0 points  Count back from 20 2 points 0 points 2 points 0 points 0 points  Months in reverse 0 points  0 points 0 points 0 points 0 points  Repeat phrase 4 points 2 points 2 points 0 points 0 points  Total Score 6 points 2 points 4 points 0 points 0 points    Immunizations Immunization History  Administered Date(s) Administered   Fluad Quad(high Dose 65+) 01/10/2019, 12/27/2019, 01/10/2021, 01/22/2022   Fluad Trivalent(High Dose 65+) 01/26/2023   Influenza, High Dose Seasonal PF 02/13/2017, 12/16/2017   Influenza,inj,Quad PF,6+ Mos 02/05/2015   Influenza-Unspecified 02/05/2015, 01/27/2016   PFIZER(Purple Top)SARS-COV-2 Vaccination 06/06/2019, 06/27/2019   Pneumococcal Conjugate-13 06/18/2015   Pneumococcal Polysaccharide-23 05/01/2017   Tdap 02/16/2014   Zoster, Live 12/16/2014    TDAP status: Up to date  Flu Vaccine status: Up to date  Pneumococcal vaccine status: Up to date  Covid-19 vaccine status: Declined, Education has been provided regarding the importance of this vaccine but patient still declined. Advised may receive this vaccine at local pharmacy or Health Dept.or vaccine clinic. Aware to provide a copy of the vaccination record if obtained from local pharmacy or Health Dept. Verbalized acceptance and understanding.  Qualifies for Shingles Vaccine? Yes   Zostavax completed No     Screening Tests Health Maintenance  Topic Date Due   Zoster Vaccines- Shingrix (1 of 2) 06/17/1969   Colonoscopy  07/27/2022   INFLUENZA VACCINE  11/13/2023   DTaP/Tdap/Td (2 - Td or Tdap) 02/17/2024   Medicare Annual Wellness (AWV)  08/23/2024   Pneumonia Vaccine 40+ Years old  Completed   Hepatitis C Screening  Completed   HPV VACCINES  Aged Out   Meningococcal B Vaccine  Aged Out   COVID-19 Vaccine  Discontinued   Fecal DNA (Cologuard)  Discontinued    Health Maintenance  Health Maintenance Due  Topic Date Due   Zoster Vaccines- Shingrix (1 of 2) 06/17/1969   Colonoscopy  07/27/2022    Colorectal cancer screening: Type of screening: Colonoscopy. Completed 07/27/19. Repeat  every 3 years  Lung Cancer Screening: (Low Dose CT Chest recommended if Age 73-80 years, 20 pack-year currently smoking OR have quit w/in 15years.) does not qualify.   Additional Screening:  Hepatitis C Screening: does qualify; Completed 08/23/15  Vision Screening: Recommended annual ophthalmology exams for early detection of glaucoma and other disorders of the eye. Is the patient up to date with their annual eye  exam?  Yes   Dental Screening: Recommended annual dental exams for proper oral hygiene  Community Resource Referral / Chronic Care Management: CRR required this visit?  No   CCM required this visit?  No     Plan:     I have personally reviewed and noted the following in the patient's chart:   Medical and social history Use of alcohol , tobacco or illicit drugs  Current medications and supplements including opioid prescriptions. Patient is currently taking opioid prescriptions. Information provided to patient regarding non-opioid alternatives. Patient advised to discuss non-opioid treatment plan with their provider. Functional ability and status Nutritional status Physical activity Advanced directives List of other physicians Hospitalizations, surgeries, and ER visits in previous 12 months Vitals Screenings to include cognitive, depression, and falls Referrals and appointments  In addition, I have reviewed and discussed with patient certain preventive protocols, quality metrics, and best practice recommendations. A written personalized care plan for preventive services as well as general preventive health recommendations were provided to patient.     Terre Ferri, DO   08/24/2023   After Visit Summary: (In Person-Printed) AVS printed and given to the patient

## 2023-08-25 ENCOUNTER — Encounter: Payer: Self-pay | Admitting: Family Medicine

## 2023-08-25 NOTE — Telephone Encounter (Signed)
 Copied from CRM [id number]. Topic: Clinical - Medication Question >> Aug 25, 2023  8:23 AM Oddis Bench wrote: Reason for CRM: Patient is calling to request that the doctor call in the prescription for cymbalta that she offered him. [facility] pharmacy is the one that the patient would like it called in to.

## 2023-08-25 NOTE — Telephone Encounter (Signed)
 Results from Flex Sig completed on 10/15/2022 have been abstracted per PCP. Request for Cymbalta prescription has already been sent to pharmacy.    Disp Refills Start End    DULoxetine (CYMBALTA) 20 MG capsule 15 capsule 3 08/24/2023 --   Sig - Route: Take 1 capsule (20 mg total) by mouth every other day. - Oral   Sent to pharmacy as: DULoxetine (CYMBALTA) 20 MG capsule   E-Prescribing Status: Receipt confirmed by pharmacy (08/24/2023  3:43 PM EDT)

## 2023-08-25 NOTE — Telephone Encounter (Signed)
 Copied from CRM [id number]. Topic: Clinical - Medical Advice >> Aug 24, 2023  2:42 PM Georgeann Kindred wrote: Reason for CRM: Patient's wife, Ira Mann, called asking if it is necessary for the patient to have another colonoscopy as one was completed when the patient had surgery on his intestines in 10/2022. They were unable to get a good reading due to the bowels being cloudy and would not clear up due to the patient having Parkinsons.  Mrs. Wollard has some question regarding the HPI and why she received a print out of it as well as some corrections that need to be made.  In addition, she has some concerns about the Symbolta; as he is hypertension, blood thinners, and has a heart condition. Please contact Ty Gales at [phone] for additional information. Best to call in the afternoon on 05/13.

## 2023-08-26 ENCOUNTER — Telehealth: Payer: Self-pay | Admitting: Family Medicine

## 2023-08-26 NOTE — Telephone Encounter (Unsigned)
 Copied from CRM [id number]. Topic: Clinical - Medication Refill >> Aug 26, 2023 12:08 PM Thomas [name] wrote: Medication: rivaroxaban  (XARELTO ) 20 MG TABS tablet  Has the patient contacted their pharmacy? No (Agent: If no, request that the patient contact the pharmacy for the refill. If patient does not wish to contact the pharmacy document the reason why and proceed with request.) (Agent: If yes, when and what did the pharmacy advise?)  This is the patient'[name] preferred pharmacy:  [facility] [id number] [place], [place] - [address] [place] [address] Phone: [phone] Fax: [phone]  Is this the correct pharmacy for this prescription? Yes If no, delete pharmacy and type the correct one.   Has the prescription been filled recently? No  Is the patient out of the medication? No  Has the patient been seen for an appointment in the last year OR does the patient have an upcoming appointment? Yes  Can we respond through MyChart? No  Agent: Please be advised that Rx refills may take up to 3 business days. We ask that you follow-up with your pharmacy.

## 2023-08-27 IMAGING — CT CT HEAD W/O CM
4 series · 16 of 47 positions shown, 18 images · non-contrast
Comparison: None.

CLINICAL DATA: Acute neuro deficit. Bilateral lower extremity
weakness

EXAM:
CT HEAD WITHOUT CONTRAST
TECHNIQUE: Contiguous axial images were obtained from the base of the skull
through the vertex without intravenous contrast.

[Series 2: head wo · axial · 0.45mm/px · z∈[-76,+29]mm · 7 of 29 slices shown, 9 images]
[im 4/29  brain]
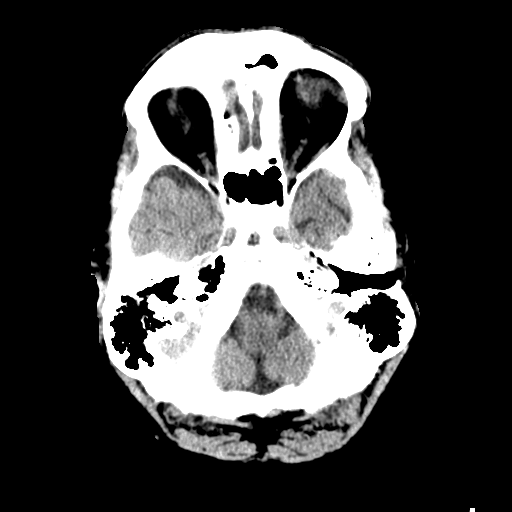
[im 4/29  bone]
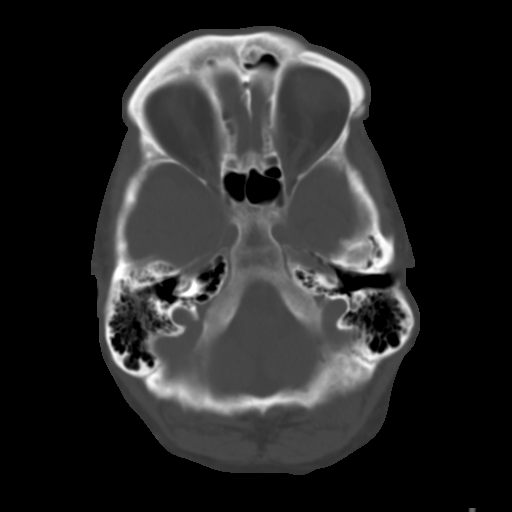
[im 8/29  brain]
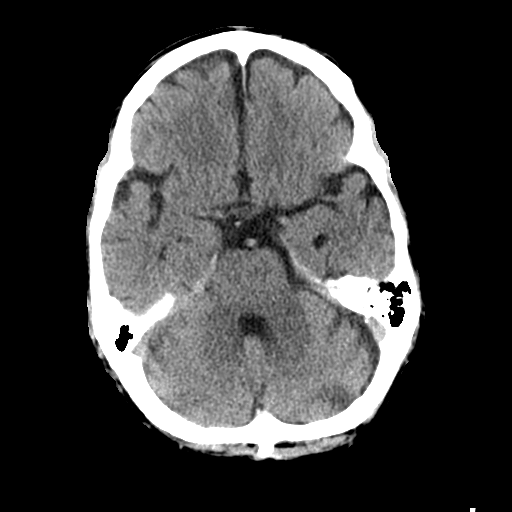
[im 11/29  brain]
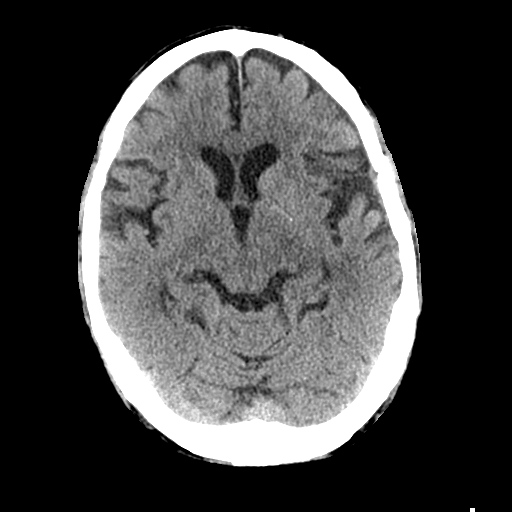
[im 15/29  brain]
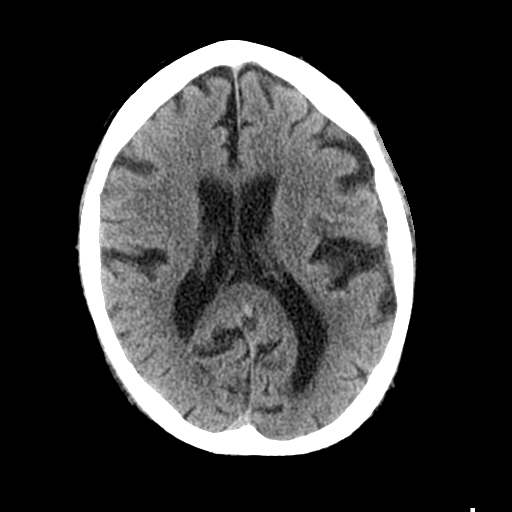
[im 18/29  brain]
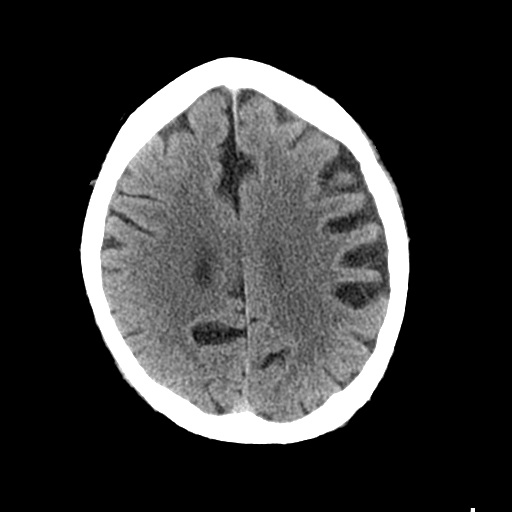
[im 18/29  bone]
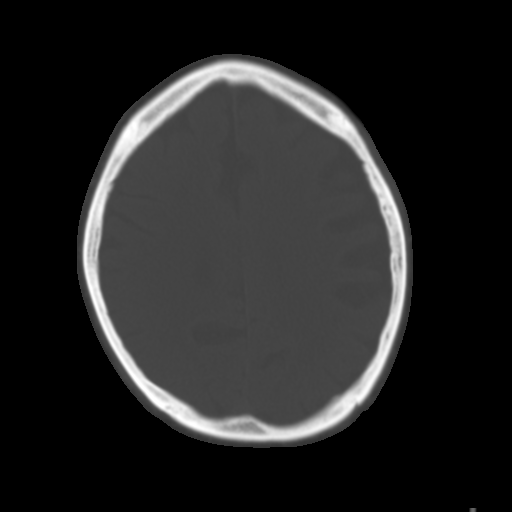
[im 22/29  brain]
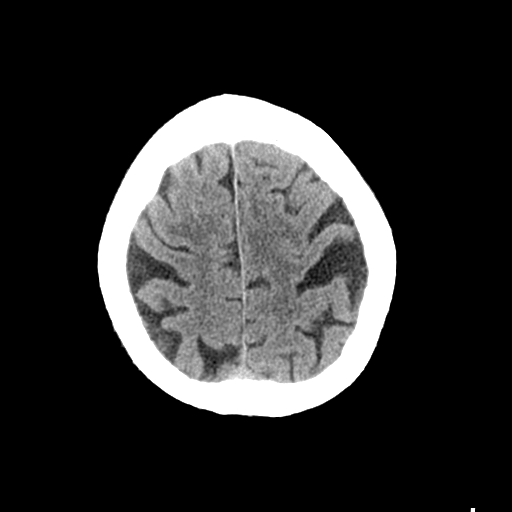
[im 25/29  brain]
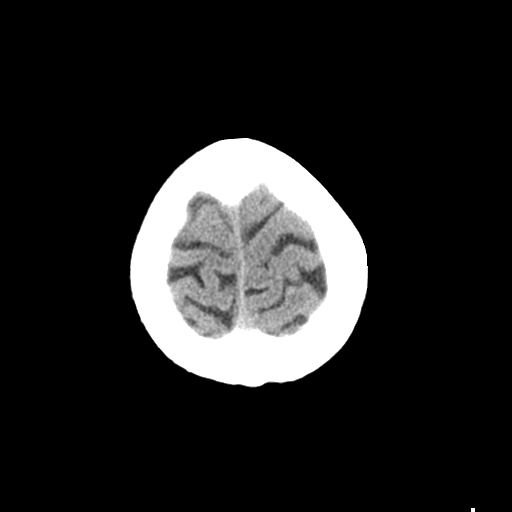

[Series 3: head bone · axial · 0.45mm/px · z∈[-77,-49]mm · 3 of 73 slices shown]
[im 8/73  bone]
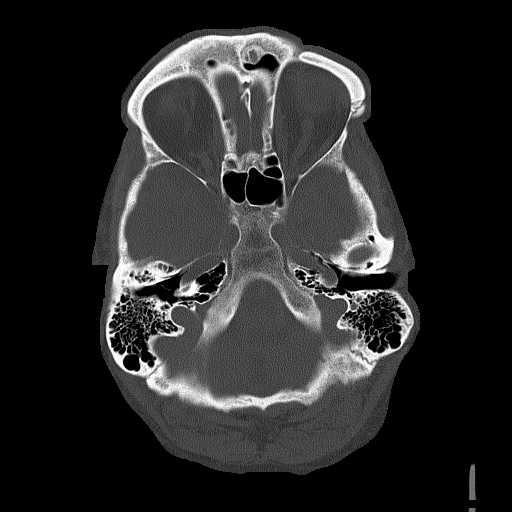
[im 15/73  bone]
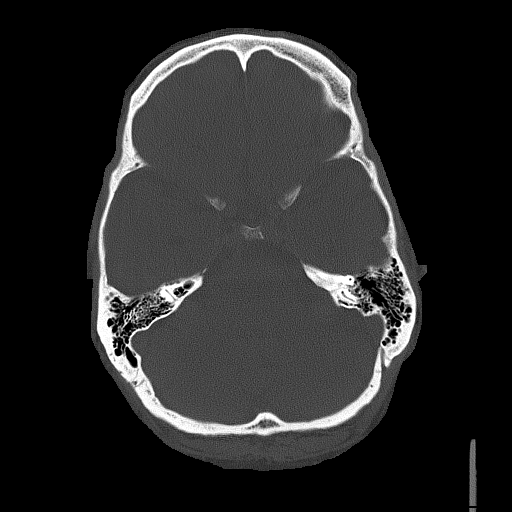
[im 22/73  bone]
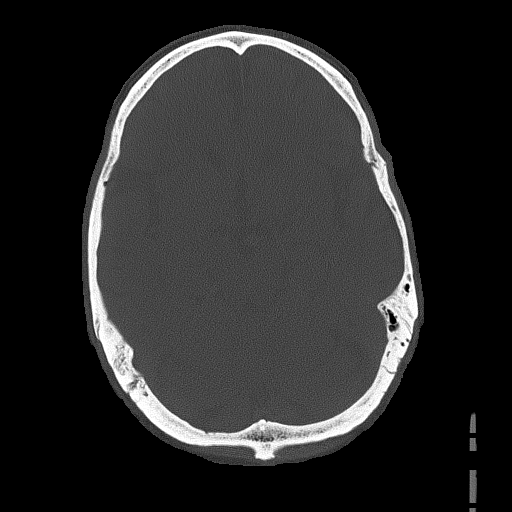

[Series 4: coronal soft tissue · coronal · 0.31mm/px · 3 of 70 slices shown]
[im 24/70  brain]
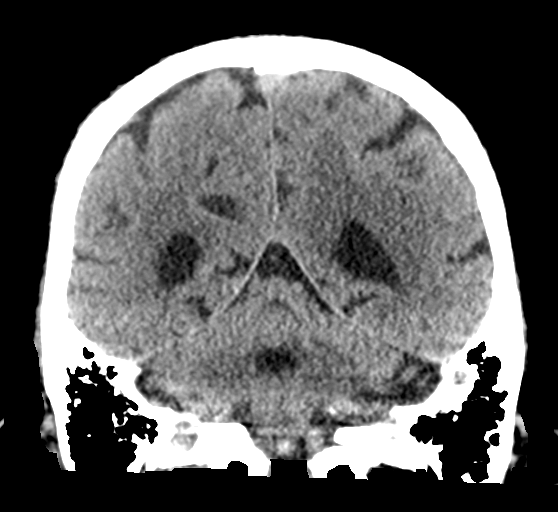
[im 31/70  brain]
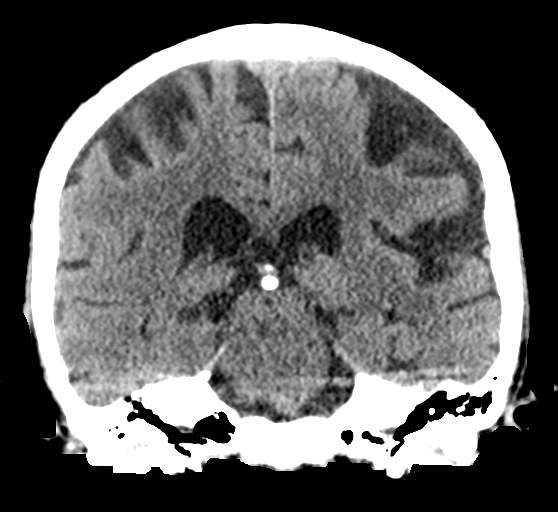
[im 39/70  brain]
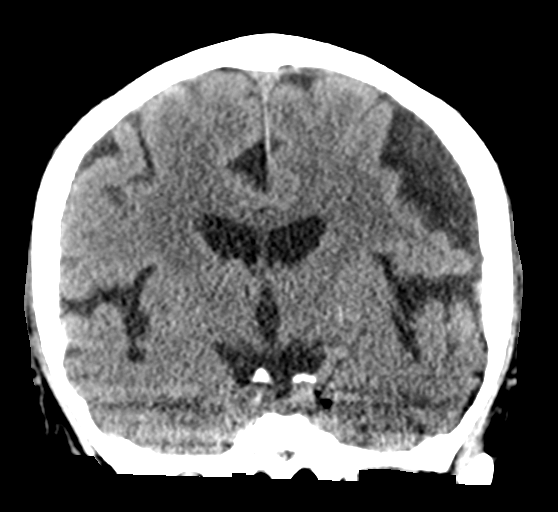

[Series 5: sagittal soft tissue · sagittal · 0.31mm/px · 3 of 57 slices shown]
[im 19/57  brain]
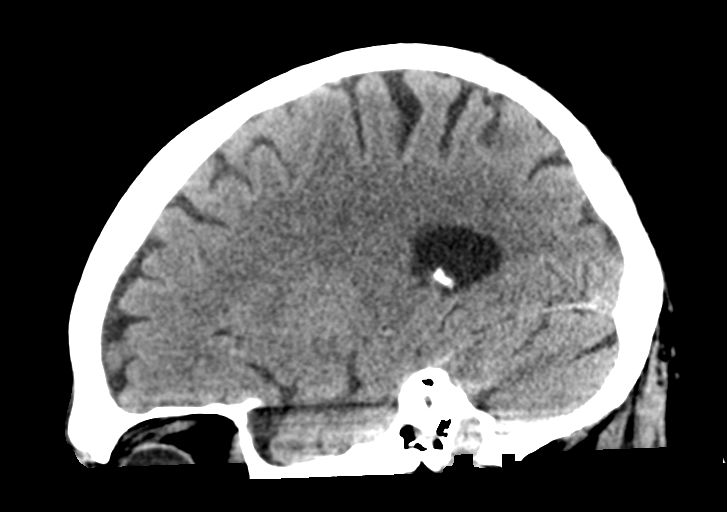
[im 29/57  brain]
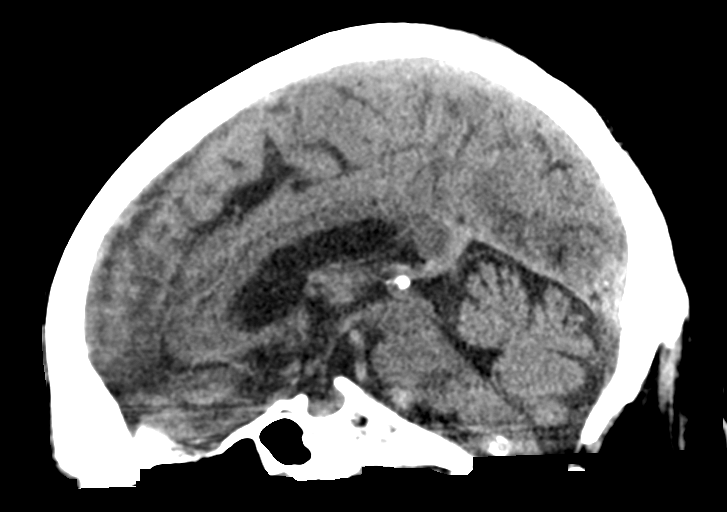
[im 38/57  brain]
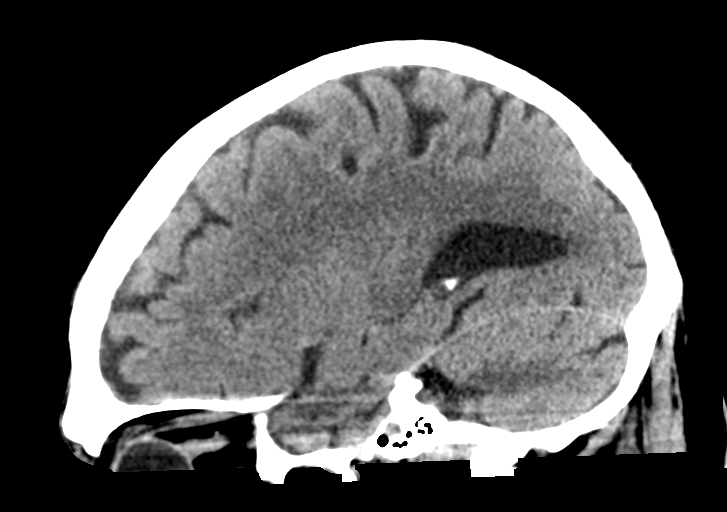

[16 of 47 positions shown; findings below may reference images not displayed]

FINDINGS: Brain: Mild generalized atrophy. Negative for acute infarct,
hemorrhage, mass

Vascular: Negative for hyperdense vessel.

Skull: Negative

Sinuses/Orbits: Negative

Other: None
IMPRESSION: No acute intracranial abnormality.  Mild generalized atrophy

## 2023-08-28 ENCOUNTER — Ambulatory Visit: Payer: Self-pay | Admitting: Family Medicine

## 2023-08-28 LAB — PSA: Prostate Specific Ag, Serum: 2.2 ng/mL (ref 0.0–4.0)

## 2023-08-28 LAB — VITAMIN D 25 HYDROXY (VIT D DEFICIENCY, FRACTURES): Vit D, 25-Hydroxy: 106 ng/mL — ABNORMAL HIGH (ref 30.0–100.0)

## 2023-08-28 LAB — COMPREHENSIVE METABOLIC PANEL WITH GFR
ALT: 8 IU/L (ref 0–44)
AST: 14 IU/L (ref 0–40)
Albumin: 4.5 g/dL (ref 3.8–4.8)
Alkaline Phosphatase: 105 IU/L (ref 44–121)
BUN/Creatinine Ratio: 16 (ref 10–24)
BUN: 16 mg/dL (ref 8–27)
Bilirubin Total: 0.5 mg/dL (ref 0.0–1.2)
CO2: 20 mmol/L (ref 20–29)
Calcium: 9.3 mg/dL (ref 8.6–10.2)
Chloride: 103 mmol/L (ref 96–106)
Creatinine, Ser: 1.01 mg/dL (ref 0.76–1.27)
Globulin, Total: 2.6 g/dL (ref 1.5–4.5)
Glucose: 104 mg/dL — ABNORMAL HIGH (ref 70–99)
Potassium: 4.2 mmol/L (ref 3.5–5.2)
Sodium: 139 mmol/L (ref 134–144)
Total Protein: 7.1 g/dL (ref 6.0–8.5)
eGFR: 78 mL/min/{1.73_m2} (ref >59)

## 2023-08-28 LAB — LIPID PANEL W/O CHOL/HDL RATIO
Cholesterol, Total: 131 mg/dL (ref 100–199)
HDL: 53 mg/dL (ref >39)
LDL Chol Calc (NIH): 66 mg/dL (ref 0–99)
Triglycerides: 55 mg/dL (ref 0–149)
VLDL Cholesterol Cal: 12 mg/dL (ref 5–40)

## 2023-08-28 LAB — VITAMIN B12: Vitamin B-12: 1116 pg/mL (ref 232–1245)

## 2023-08-28 LAB — TSH: TSH: 1.18 u[IU]/mL (ref 0.450–4.500)

## 2023-08-28 LAB — FOLATE: Folate: >20 ng/mL (ref >3.0)

## 2023-08-28 LAB — VITAMIN B6: Vitamin B6: 65.4 ug/L — ABNORMAL HIGH (ref 3.4–65.2)

## 2023-08-28 MED ORDER — RIVAROXABAN 20 MG PO TABS: 20.0000 mg | ORAL_TABLET | Freq: Every day | ORAL | 0 refills | Status: DC

## 2023-08-28 NOTE — Telephone Encounter (Signed)
 Forwarding to PCP.

## 2023-08-31 NOTE — Telephone Encounter (Signed)
 Yes please

## 2023-08-31 NOTE — Telephone Encounter (Signed)
Results printed and mailed.   

## 2023-09-01 ENCOUNTER — Ambulatory Visit: Admitting: Physician Assistant

## 2023-09-01 NOTE — Telephone Encounter (Signed)
 Completed.

## 2023-09-04 ENCOUNTER — Ambulatory Visit: Admitting: Physician Assistant

## 2023-09-04 DIAGNOSIS — N3941 Urge incontinence: Secondary | ICD-10-CM

## 2023-09-04 NOTE — Progress Notes (Signed)
 PTNS  Session # 13  Health & Social Factors: no change Caffeine: <1 Alcohol : 0 Daytime voids #per day: 5-7 Night-time voids #per night: 2 Urgency: strong Incontinence Episodes #per day: <1 Ankle used: right Treatment Setting: 11 Feeling/ Response: sensory Comments: Patient tolerated well  Performed By: Kathreen Pare, PA-C   Follow Up: 1 month

## 2023-09-05 ENCOUNTER — Other Ambulatory Visit: Payer: Self-pay | Admitting: Neurology

## 2023-09-05 DIAGNOSIS — G20A1 Parkinson's disease without dyskinesia, without mention of fluctuations: Secondary | ICD-10-CM

## 2023-09-05 DIAGNOSIS — F0670 Mild neurocognitive disorder due to known physiological condition without behavioral disturbance: Secondary | ICD-10-CM

## 2023-09-10 ENCOUNTER — Ambulatory Visit: Payer: Medicare HMO | Admitting: Dermatology

## 2023-09-11 NOTE — Telephone Encounter (Signed)
 Called patient to schedule a Return in about 3 months (around 11/24/2023). Left message for patient to call back and schedule.

## 2023-09-18 ENCOUNTER — Ambulatory Visit: Admitting: Pediatrics

## 2023-10-02 ENCOUNTER — Ambulatory Visit: Admitting: Physician Assistant

## 2023-10-05 ENCOUNTER — Ambulatory Visit: Admitting: Dermatology

## 2023-10-05 ENCOUNTER — Encounter: Payer: Self-pay | Admitting: Dermatology

## 2023-10-05 DIAGNOSIS — L821 Other seborrheic keratosis: Secondary | ICD-10-CM

## 2023-10-05 DIAGNOSIS — Z7189 Other specified counseling: Secondary | ICD-10-CM

## 2023-10-05 DIAGNOSIS — D692 Other nonthrombocytopenic purpura: Secondary | ICD-10-CM

## 2023-10-05 DIAGNOSIS — Z79899 Other long term (current) drug therapy: Secondary | ICD-10-CM

## 2023-10-05 DIAGNOSIS — L820 Inflamed seborrheic keratosis: Secondary | ICD-10-CM | POA: Diagnosis not present

## 2023-10-05 DIAGNOSIS — L738 Other specified follicular disorders: Secondary | ICD-10-CM

## 2023-10-05 DIAGNOSIS — L300 Nummular dermatitis: Secondary | ICD-10-CM

## 2023-10-05 DIAGNOSIS — L578 Other skin changes due to chronic exposure to nonionizing radiation: Secondary | ICD-10-CM | POA: Diagnosis not present

## 2023-10-05 DIAGNOSIS — L814 Other melanin hyperpigmentation: Secondary | ICD-10-CM

## 2023-10-05 DIAGNOSIS — L2089 Other atopic dermatitis: Secondary | ICD-10-CM

## 2023-10-05 DIAGNOSIS — Z1283 Encounter for screening for malignant neoplasm of skin: Secondary | ICD-10-CM

## 2023-10-05 DIAGNOSIS — L570 Actinic keratosis: Secondary | ICD-10-CM

## 2023-10-05 DIAGNOSIS — W908XXA Exposure to other nonionizing radiation, initial encounter: Secondary | ICD-10-CM | POA: Diagnosis not present

## 2023-10-05 DIAGNOSIS — L817 Pigmented purpuric dermatosis: Secondary | ICD-10-CM

## 2023-10-05 DIAGNOSIS — S80812A Abrasion, left lower leg, initial encounter: Secondary | ICD-10-CM

## 2023-10-05 DIAGNOSIS — D1801 Hemangioma of skin and subcutaneous tissue: Secondary | ICD-10-CM

## 2023-10-05 DIAGNOSIS — D229 Melanocytic nevi, unspecified: Secondary | ICD-10-CM

## 2023-10-05 MED ORDER — MOMETASONE FUROATE 0.1 % EX CREA: TOPICAL_CREAM | CUTANEOUS | 2 refills | Status: AC

## 2023-10-05 NOTE — Progress Notes (Signed)
 Follow-Up Visit   Subjective  Thomas [name] is a 73 y.o. male who presents for the following: Skin Cancer Screening and Full Body Skin Exam. Hx of DN. Hx of AKs. No personal Hx of skin cancer.   Areas of concern on left forehead and back. Area on back is itchy. Has been using OTC HC cream.   Injury to L shin. ~10 days ago. Would like to make sure is healing normally.   The patient presents for Total-Body Skin Exam (TBSE) for skin cancer screening and mole check. The patient has spots, moles and lesions to be evaluated, some may be new or changing and the patient may have concern these could be cancer.  Wife is with patient and contributes to history.  The following portions of the chart were reviewed this encounter and updated as appropriate: medications, allergies, medical history  Review of Systems:  No other skin or systemic complaints except as noted in HPI or Assessment and Plan.  Objective  Well appearing patient in no apparent distress; mood and affect are within normal limits.  A full examination was performed including scalp, head, eyes, ears, nose, lips, neck, chest, axillae, abdomen, back, buttocks, bilateral upper extremities, bilateral lower extremities, hands, feet, fingers, toes, fingernails, and toenails. All findings within normal limits unless otherwise noted below.   Relevant physical exam findings are noted in the Assessment and Plan.  left lower pretibial Large heme crusted plaque Left Forehead x1, Left scalp x2 (3) Erythematous keratotic or waxy stuck-on papule or plaque. Left Occipital Scalp x1 Erythematous thin papules/macules with gritty scale.  B/L lower legs Scattered rust colored macules and patches  Assessment & Plan   SKIN CANCER SCREENING PERFORMED TODAY.  ACTINIC DAMAGE - Chronic condition, secondary to cumulative UV/sun exposure - diffuse scaly erythematous macules with underlying dyspigmentation - Recommend daily broad spectrum sunscreen  SPF 30+ to sun-exposed areas, reapply every 2 hours as needed.  - Staying in the shade or wearing long sleeves, sun glasses (UVA+UVB protection) and wide brim hats (4-inch brim around the entire circumference of the hat) are also recommended for sun protection.  - Call for new or changing lesions.  LENTIGINES, SEBORRHEIC KERATOSES, HEMANGIOMAS - Benign normal skin lesions - Benign-appearing - Call for any changes  MELANOCYTIC NEVI - Tan-brown and/or pink-flesh-colored symmetric macules and papules - Benign appearing on exam today - Observation - Call clinic for new or changing moles - Recommend daily use of broad spectrum spf 30+ sunscreen to sun-exposed areas.   Purpura - Chronic; persistent and recurrent.  Treatable, but not curable. - Violaceous macules and patches at arms - Benign - Related to trauma, age, sun damage and/or use of blood thinners, chronic use of topical and/or oral steroids - Observe - Can use OTC arnica containing moisturizer such as Dermend Bruise Formula if desired - Call for worsening or other concerns  Nummular Dermatitis Exam: Pink scaly patch at back Chronic and persistent condition with duration or expected duration over one year. Condition is bothersome/symptomatic for patient. Currently flared. Nummular dermatitis (eczema) is a chronic, relapsing, itchy rash that can significantly affect quality of life. It is often associated with dry skin and flares in the wintertime, and may require treatment with prescription topical anti-inflammatory medications, in addition to gentle skin care.  If there is associated atopic dermatitis and topicals are not working, then biologic injections may be necessary to clear rash and control symptoms.  Treatment Plan: Start Mometasone cream twice daily to affected areas on back  for dermatitis. Avoid applying to face, groin, and axilla. Use as directed. Long-term use can cause thinning of the skin.  Topical steroids (such as  triamcinolone, fluocinolone, fluocinonide, mometasone, clobetasol, halobetasol, betamethasone , hydrocortisone) can cause thinning and lightening of the skin if they are used for too long in the same area. Your physician has selected the right strength medicine for your problem and area affected on the body. Please use your medication only as directed by your physician to prevent side effects.   Recommend mild soap and moisturizing cream 1-2 times daily.  Gentle skin care handout provided.    ABRASION OF LEFT LOWER LEG, INITIAL ENCOUNTER left lower pretibial Wash daily with soap and water. Apply Vaseline Jelly and band-aid once daily until healed.  INFLAMED SEBORRHEIC KERATOSIS (3) Left Forehead x1, Left scalp x2 (3) With sebaceous hyperplasia (forehead)  Symptomatic, irritating, patient would like treated. Destruction of lesion - Left Forehead x1, Left scalp x2 (3) Complexity: simple   Destruction method: cryotherapy   Informed consent: discussed and consent obtained   Timeout:  patient name, date of birth, surgical site, and procedure verified Lesion destroyed using liquid nitrogen: Yes   Region frozen until ice ball extended beyond lesion: Yes   Outcome: patient tolerated procedure well with no complications   Post-procedure details: wound care instructions given   Additional details:  Prior to procedure, discussed risks of blister formation, small wound, skin dyspigmentation, or rare scar following cryotherapy. Recommend Vaseline ointment to treated areas while healing.  AK (ACTINIC KERATOSIS) Left Occipital Scalp x1 Actinic keratoses are precancerous spots that appear secondary to cumulative UV radiation exposure/sun exposure over time. They are chronic with expected duration over 1 year. A portion of actinic keratoses will progress to squamous cell carcinoma of the skin. It is not possible to reliably predict which spots will progress to skin cancer and so treatment is recommended to  prevent development of skin cancer.  Recommend daily broad spectrum sunscreen SPF 30+ to sun-exposed areas, reapply every 2 hours as needed.  Recommend staying in the shade or wearing long sleeves, sun glasses (UVA+UVB protection) and wide brim hats (4-inch brim around the entire circumference of the hat). Call for new or changing lesions. Destruction of lesion - Left Occipital Scalp x1 Complexity: simple   Destruction method: cryotherapy   Informed consent: discussed and consent obtained   Timeout:  patient name, date of birth, surgical site, and procedure verified Lesion destroyed using liquid nitrogen: Yes   Region frozen until ice ball extended beyond lesion: Yes   Outcome: patient tolerated procedure well with no complications   Post-procedure details: wound care instructions given   Additional details:  Prior to procedure, discussed risks of blister formation, small wound, skin dyspigmentation, or rare scar following cryotherapy. Recommend Vaseline ointment to treated areas while healing.  SCHAMBERG'S PURPURA B/L lower legs Benign-appearing.  Observation.  Call clinic for new or changing lesions.  Recommend daily use of broad spectrum spf 30+ sunscreen to sun-exposed areas.   Return in about 1 year (around 10/04/2024) for TBSE, HxDN, HxAK.  I, Jill Parcell, CMA, am acting as scribe for Alm Rhyme, MD.   Documentation: I have reviewed the above documentation for accuracy and completeness, and I agree with the above.  Alm Rhyme, MD

## 2023-10-05 NOTE — Patient Instructions (Addendum)
 Cryotherapy Aftercare  Wash gently with soap and water everyday.   Apply Vaseline and Band-Aid daily until healed.    Start Mometasone cream twice daily to affected areas on back for dermatitis. Avoid applying to face, groin, and axilla. Use as directed. Long-term use can cause thinning of the skin.  Topical steroids (such as triamcinolone, fluocinolone, fluocinonide, mometasone, clobetasol, halobetasol, betamethasone , hydrocortisone) can cause thinning and lightening of the skin if they are used for too long in the same area. Your physician has selected the right strength medicine for your problem and area affected on the body. Please use your medication only as directed by your physician to prevent side effects.     Recommend daily broad spectrum sunscreen SPF 30+ to sun-exposed areas, reapply every 2 hours as needed. Call for new or changing lesions.  Staying in the shade or wearing long sleeves, sun glasses (UVA+UVB protection) and wide brim hats (4-inch brim around the entire circumference of the hat) are also recommended for sun protection.      Melanoma ABCDEs  Melanoma is the most dangerous type of skin cancer, and is the leading cause of death from skin disease.  You are more likely to develop melanoma if you: Have light-colored skin, light-colored eyes, or red or blond hair Spend a lot of time in the sun Tan regularly, either outdoors or in a tanning bed Have had blistering sunburns, especially during childhood Have a close family member who has had a melanoma Have atypical moles or large birthmarks  Early detection of melanoma is key since treatment is typically straightforward and cure rates are extremely high if we catch it early.   The first sign of melanoma is often a change in a mole or a new dark spot.  The ABCDE system is a way of remembering the signs of melanoma.  A for asymmetry:  The two halves do not match. B for border:  The edges of the growth are  irregular. C for color:  A mixture of colors are present instead of an even brown color. D for diameter:  Melanomas are usually (but not always) greater than 6mm - the size of a pencil eraser. E for evolution:  The spot keeps changing in size, shape, and color.  Please check your skin once per month between visits. You can use a small mirror in front and a large mirror behind you to keep an eye on the back side or your body.   If you see any new or changing lesions before your next follow-up, please call to schedule a visit.  Please continue daily skin protection including broad spectrum sunscreen SPF 30+ to sun-exposed areas, reapplying every 2 hours as needed when you're outdoors.   Staying in the shade or wearing long sleeves, sun glasses (UVA+UVB protection) and wide brim hats (4-inch brim around the entire circumference of the hat) are also recommended for sun protection.      Due to recent changes in healthcare laws, you may see results of your pathology and/or laboratory studies on MyChart before the doctors have had a chance to review them. We understand that in some cases there may be results that are confusing or concerning to you. Please understand that not all results are received at the same time and often the doctors may need to interpret multiple results in order to provide you with the best plan of care or course of treatment. Therefore, we ask that you please give us  2 business days to thoroughly  review all your results before contacting the office for clarification. Should we see a critical lab result, you will be contacted sooner.   If You Need Anything After Your Visit  If you have any questions or concerns for your doctor, please call our main line at [phone] and press option 4 to reach your doctor's medical assistant. If no one answers, please leave a voicemail as directed and we will return your call as soon as possible. Messages left after 4 pm will be answered the  following business day.   You may also send us  a message via MyChart. We typically respond to MyChart messages within 1-2 business days.  For prescription refills, please ask your pharmacy to contact our office. Our fax number is [phone].  If you have an urgent issue when the clinic is closed that cannot wait until the next business day, you can page your doctor at the number below.    Please note that while we do our best to be available for urgent issues outside of office hours, we are not available 24/7.   If you have an urgent issue and are unable to reach us , you may choose to seek medical care at your doctor's office, retail clinic, urgent care center, or emergency room.  If you have a medical emergency, please immediately call 911 or go to the emergency department.  Pager Numbers  - Dr. Hester: [phone]  - Dr. Jackquline: [phone]  - Dr. Claudene: [phone]   In the event of inclement weather, please call our main line at [phone] for an update on the status of any delays or closures.  Dermatology Medication Tips: Please keep the boxes that topical medications come in in order to help keep track of the instructions about where and how to use these. Pharmacies typically print the medication instructions only on the boxes and not directly on the medication tubes.   If your medication is too expensive, please contact our office at [phone] option 4 or send us  a message through MyChart.   We are unable to tell what your co-pay for medications will be in advance as this is different depending on your insurance coverage. However, we may be able to find a substitute medication at lower cost or fill out paperwork to get insurance to cover a needed medication.   If a prior authorization is required to get your medication covered by your insurance company, please allow us  1-2 business days to complete this process.  Drug prices often vary depending on where the  prescription is filled and some pharmacies may offer cheaper prices.  The website www.goodrx.com contains coupons for medications through different pharmacies. The prices here do not account for what the cost may be with help from insurance (it may be cheaper with your insurance), but the website can give you the price if you did not use any insurance.  - You can print the associated coupon and take it with your prescription to the pharmacy.  - You may also stop by our office during regular business hours and pick up a GoodRx coupon card.  - If you need your prescription sent electronically to a different pharmacy, notify our office through [facility] or by phone at [phone] option 4.     Si Usted Necesita Algo Despus de Su Visita  Tambin puede enviarnos un mensaje a travs de Clinical cytogeneticist. Por lo general respondemos a los mensajes de MyChart en el transcurso de 1 a 2 das hbiles.  Para  renovar recetas, por favor pida a su farmacia que se ponga en contacto con nuestra oficina. Randi lakes de fax es [place] [phone].  Si tiene un asunto urgente cuando la clnica est cerrada y que no puede esperar hasta el siguiente da hbil, puede llamar/localizar a su doctor(a) al nmero que aparece a continuacin.   Por favor, tenga en cuenta que aunque hacemos todo lo posible para estar disponibles para asuntos urgentes fuera del horario de [place], no estamos disponibles las 24 horas del da, los 7 [address]  [address] de la [place].   Si tiene un problema urgente y no puede comunicarse con nosotros, puede optar por buscar atencin mdica  en el consultorio de su doctor(a), en una clnica privada, en un centro de atencin urgente o en una sala de emergencias.  Si tiene Engineer, drilling, por favor llame inmediatamente al 911 o vaya a la sala de emergencias.  Nmeros de bper  - Dr. Hester: [phone]  - Dra. Jackquline: [phone]  - Dr. Claudene: [phone]   En caso de inclemencias del  tiempo, por favor llame a landry capes principal al [phone] para una actualizacin sobre el [place] de cualquier retraso o cierre.  Consejos para la medicacin en dermatologa: Por favor, guarde las cajas en las que vienen los medicamentos de uso tpico para ayudarle a seguir las instrucciones sobre dnde y cmo usarlos. Las farmacias generalmente imprimen las instrucciones del medicamento slo en las cajas y no directamente en los tubos del [place].   Si su medicamento es muy caro, por favor, pngase en contacto con landry rieger llamando al [phone] y presione la opcin 4 o envenos un mensaje a travs de Clinical cytogeneticist.   No podemos decirle cul ser su copago por los medicamentos por adelantado ya que esto es diferente dependiendo de la cobertura de su seguro. Sin embargo, es posible que podamos encontrar un medicamento sustituto a Audiological scientist un formulario para que el seguro cubra el medicamento que se considera necesario.   Si se requiere una autorizacin previa para que su compaa de seguros malta su medicamento, por favor permtanos de 1 a 2 das hbiles para completar este proceso.  Los precios de los medicamentos varan con frecuencia dependiendo del Environmental consultant de dnde se surte la receta y alguna farmacias pueden ofrecer precios ms baratos.  El sitio web www.goodrx.com tiene cupones para medicamentos de Health and safety inspector. Los precios aqu no tienen en cuenta lo que podra costar con la ayuda del seguro (puede ser ms barato con su seguro), pero el sitio web puede darle el precio si no utiliz Tourist information centre manager.  - Puede imprimir el cupn correspondiente y llevarlo con su receta a la farmacia.  - Tambin puede pasar por nuestra oficina durante el horario de atencin regular y Education officer, museum una tarjeta de cupones de GoodRx.  - Si necesita que su receta se enve electrnicamente a una farmacia diferente, informe a nuestra oficina a travs de MyChart de [place] o por telfono  llamando al [phone] y presione la opcin 4.

## 2023-10-06 ENCOUNTER — Encounter

## 2023-10-07 ENCOUNTER — Other Ambulatory Visit: Payer: Self-pay | Admitting: Neurology

## 2023-10-07 DIAGNOSIS — G20A1 Parkinson's disease without dyskinesia, without mention of fluctuations: Secondary | ICD-10-CM

## 2023-10-07 DIAGNOSIS — G20B1 Parkinson's disease with dyskinesia, without mention of fluctuations: Secondary | ICD-10-CM

## 2023-10-07 DIAGNOSIS — G20B2 Parkinson's disease with dyskinesia, with fluctuations: Secondary | ICD-10-CM

## 2023-10-09 ENCOUNTER — Ambulatory Visit: Admitting: Physician Assistant

## 2023-10-09 VITALS — BP 113/68 | HR 65 | Ht 69.0 in | Wt 250.0 lb

## 2023-10-09 DIAGNOSIS — N3941 Urge incontinence: Secondary | ICD-10-CM

## 2023-10-09 NOTE — Progress Notes (Unsigned)
 Assessment/Plan:    1.  Parkinsons Disease             -continue Ropinirole  2 mg 3 times per day.               -Continue carbidopa /levodopa  25/100, 2 tablets at 6 AM/10 AM/2 PM/6 PM             -Continue carbidopa /levodopa  50/200 CR at bedtime             -He is following regularly with dermatology.              - Has Inbrija  for first morning on and as needed and helps when needed             -Discussed walker at all times  -discussed local daytime driving only (within 5 miles)  -discussed Parkinsons Disease programming/symposium   2.  Parkinson's dyskinesia             -Continue amantadine , 100 mg 3 times per day.      3.  RBD             -Discussed safety.  Declines medication right now.   4.  MCI             -Neurocognitive test in August, 2020 and March, 2024 without evidence of dementia.  He did have some decline between the 2 tests, but diagnosis was MCI             -Discussed how hydrocodone  can affect memory.  Will watch as they are increasing dosage                5.  Neurogenic Orthostatic Hypotension             -Remains bradycardic.             -likely due to BP meds in combination with Parkinsons Disease.   He is on hydralazine , lisinopril , metoprolol  and tamsulosin  which all are driving down blood pressure as well.   Discussed concept of permissive HTN.  He has been discussing blood pressure medications with primary care, but they have decided to continue current regimen for now.   6.    Lumbar spinal stenosis and neural foraminal stenosis             -status post surgical intervention with microdiscectomy at L3-L4 and L4-L5 on October 11, 2020 with Dr. Unice.  He has followed with PM&R at the [facility] clinic and has followed with Dr. Darlis as well.  Patient does have spinal cord stimulator.   7.  Sleep disturbance             -On trazodone , prescribed by primary care             -On melatonin, 5 mg nightly   8.  Urinary incontinence             -Following  with urology.               -has done PTNS and this is helping.  He last saw urology on Friday and notes were reviewed.  Subjective:   Thomas [name] was seen today in follow up for Parkinsons disease.  My previous records were reviewed prior to todays visit as well as outside records available to me.  Pt with wife who supplements hx. last visit, his ropinirole  was decreased from 3 mg 3 times per day to 2 mg 3 times per day, mostly because of cognitive  complaints.  I also asked him to take Inbrija  every morning for first morning on and he reports that he does that sometimes in the AM and sometimes not.  He does find that it helps his day go better.  Separately, I have reviewed the patient's notes from his recent urology visit for PTNS as well as his dermatology visit from June 23.  His primary care reached out to me on May 12 and asked me about the addition of Cymbalta , which I agreed with.  They report that he is only taking it every other day as he was trying to avoid being sleepy, although he doesn't think he is more sleepy on it right now.  One fall since last visit - he was on a slope in the yard and trying to look up and fell backward on his buttocks.    Current prescribed movement disorder medications: Ropinirole , 2 mg 3 times per day (decreased) Carbidopa /levodopa  25/100, 2 tablets at 6 AM/2 tablets at 10 AM/2 tablets at 2 PM/2 tablets at 6 PM Amantadine  100 mg 3 times per day carbidopa /levodopa  50/200 CR q hs Inbrija     ALLERGIES:   Allergies  Allergen Reactions   Codeine Other (See Comments)    Hallucinations   Other Nausea Only    General anesthesia     CURRENT MEDICATIONS:  Outpatient Encounter Medications as of 10/13/2023  Medication Sig   acetaminophen  (TYLENOL ) 500 MG tablet 1 to 2 tablets as needed   amantadine  (SYMMETREL ) 100 MG capsule TAKE 1 CAPSULE BY MOUTH 3 TIMES A DAY   amLODipine  (NORVASC ) 2.5 MG tablet TAKE 1 TABLET BY MOUTH EVERY MORNING AS NEEDED FOR BLOOD  PRESSURE GREATER THAN 160/90   baclofen  (LIORESAL ) 10 MG tablet Take 1 tablet (10 mg total) by mouth 3 (three) times daily.   carbidopa -levodopa  (SINEMET  CR) 50-200 MG tablet TAKE 1 TABLET BY MOUTH AT BEDTIME   carbidopa -levodopa  (SINEMET  IR) 25-100 MG tablet TAKE 2 TABLETS BY MOUTH 4 TIMES A DAY AT 6:00AM AND 10:00AM ,IN THE AFTERNOON AND 6:00PM   cetirizine (ZYRTEC) 10 MG tablet Take 10 mg by mouth at bedtime.   cholecalciferol  (VITAMIN D3) 25 MCG (1000 UNIT) tablet Take 1,000 Units by mouth daily.   cyanocobalamin (VITAMIN B12) 1000 MCG tablet Take 1,000 mcg by mouth daily.   diclofenac  Sodium (VOLTAREN ) 1 % GEL APPLY TOPICALLY 4 GRAMS FOUR TIMES DAILY   DULoxetine  (CYMBALTA ) 20 MG capsule Take 1 capsule (20 mg total) by mouth every other day.   folic acid (FOLVITE) 800 MCG tablet Take 400 mcg by mouth daily.   HYDROcodone -acetaminophen  (NORCO/VICODIN) 5-325 MG tablet Take 1 tablet by mouth 2 (two) times daily.   Levodopa  (INBRIJA ) 42 MG CAPS Samples of this drug were given to the patient, quantity 1 box of 32 capsules, Lot Number [id number] Exp: 03/2023  Patient has been educated on dosage and uses of medication.   Levodopa  (INBRIJA ) 42 MG CAPS Samples of this drug were given to the patient, quantity 1, Lot Number [id number] Exp 12-24   Levodopa  (INBRIJA ) 42 MG CAPS TWO capsules is ONE dosage (never inhale just one capsule).  You can inhale the capsules as needed up to 5 times per day, separated by 2 hour intervals.   lisinopril  (ZESTRIL ) 30 MG tablet Take 1 tablet (30 mg total) by mouth daily.   melatonin 5 MG TABS Take 5 mg by mouth at bedtime.   metoprolol  succinate (TOPROL -XL) 25 MG 24 hr tablet Take 1 tablet (25 mg total)  by mouth at bedtime.   mometasone  (ELOCON ) 0.1 % cream Apply twice daily to affected areas on back for dermatitis. Avoid applying to face, groin, and axilla.   Multiple Vitamin (MULTIVITAMIN) tablet Take 1 tablet by mouth daily at 6 PM.   oxybutynin  (DITROPAN -XL) 10 MG  24 hr tablet Take 1 tablet (10 mg total) by mouth at bedtime.   polyethylene glycol powder (GLYCOLAX /MIRALAX ) 17 GM/SCOOP powder Take 17 g by mouth at bedtime.   PREVIDENT 5000 BOOSTER PLUS 1.1 % PSTE Place 1 application onto teeth 2 (two) times daily.   pyridoxine (B-6) 100 MG tablet Take 100 mg by mouth daily.   rivaroxaban  (XARELTO ) 20 MG TABS tablet Take 1 tablet (20 mg total) by mouth daily.   rOPINIRole  (REQUIP ) 2 MG tablet Take 2 mg 3 times per day.   simvastatin  (ZOCOR ) 40 MG tablet Take 1 tablet (40 mg total) by mouth daily.   tamsulosin  (FLOMAX ) 0.4 MG CAPS capsule Take 2 capsules (0.8 mg total) by mouth daily.   traZODone  (DESYREL ) 50 MG tablet Take 1 tablet (50 mg total) by mouth at bedtime.   No facility-administered encounter medications on file as of 10/13/2023.    Objective:   PHYSICAL EXAMINATION:    VITALS:   Vitals:   10/13/23 0810  BP: (!) 116/50  Pulse: (!) 55  SpO2: 97%  Weight: 217 lb (98.4 kg)    GEN:  The patient appears stated age and is in NAD. HEENT:  Normocephalic, atraumatic.  The mucous membranes are moist. The superficial temporal arteries are without ropiness or tenderness. CV:  brady.  regular Lungs:  CTAB  Neurological examination:  Orientation: The patient is alert and oriented x3.  He is able to provide his own history and ask appropriate questions. Cranial nerves: There is good facial symmetry with facial hypomimia. The speech is fluent and clear. Soft palate rises symmetrically and there is no tongue deviation. Hearing is intact to conversational tone. Sensation: Sensation is intact to light touch throughout Motor: Strength is at least antigravity x4.  Movement examination: Tone: There is normal tone in the UE/LE Abnormal movements: he has some mild RLE dyskinesia and mild dyskinesia in the shoulder Coordination:  There is no significant decremation today, with any form of RAMS, including alternating supination and pronation of the  forearm, hand opening and closing, finger taps, heel taps and toe taps.  Gait and Station: The patient pushes off to arise with the right hand.  The patient's stride length has slightly decreased stride length and decreased arm swing.  He is flexed at waist, but some of this is due to scoliosis  I have reviewed and interpreted the following labs independently    Chemistry      Component Value Date/Time   NA 139 08/24/2023 1116   K 4.2 08/24/2023 1116   CL 103 08/24/2023 1116   CO2 20 08/24/2023 1116   BUN 16 08/24/2023 1116   CREATININE 1.01 08/24/2023 1116      Component Value Date/Time   CALCIUM  9.3 08/24/2023 1116   ALKPHOS 105 08/24/2023 1116   AST 14 08/24/2023 1116   ALT 8 08/24/2023 1116   BILITOT 0.5 08/24/2023 1116       Lab Results  Component Value Date   WBC 5.3 08/24/2023   HGB 15.1 08/24/2023   HCT 47.5 08/24/2023   MCV 95 08/24/2023   PLT 160 08/24/2023    Lab Results  Component Value Date   TSH 1.180 08/24/2023  Total time spent on today's visit was 38 minutes, including both face-to-face time and nonface-to-face time.  Time included that spent on review of records (prior notes available to me/labs/imaging if pertinent), discussing treatment and goals, answering patient's questions and coordinating care.   Cc:  Vicci Bouchard P, DO

## 2023-10-09 NOTE — Progress Notes (Signed)
 PTNS  Session # 14  Health & Social Factors: no change Caffeine: <1 Alcohol : 0 Daytime voids #per day: 8-10 Night-time voids #per night: 2-3 Urgency: strong Incontinence Episodes #per day: <1 Ankle used: right Treatment Setting: 8 Feeling/ Response: Sensory Comments: Patient tolerated well  Performed By: Powell Sink, CMA  Follow Up: 1 month

## 2023-10-13 ENCOUNTER — Encounter: Payer: Self-pay | Admitting: Neurology

## 2023-10-13 ENCOUNTER — Ambulatory Visit: Admitting: Neurology

## 2023-10-13 VITALS — BP 116/50 | HR 55 | Wt 217.0 lb

## 2023-10-13 DIAGNOSIS — M545 Low back pain, unspecified: Secondary | ICD-10-CM | POA: Diagnosis not present

## 2023-10-13 DIAGNOSIS — G20B2 Parkinson's disease with dyskinesia, with fluctuations: Secondary | ICD-10-CM | POA: Diagnosis not present

## 2023-10-13 DIAGNOSIS — G8929 Other chronic pain: Secondary | ICD-10-CM

## 2023-10-13 NOTE — Patient Instructions (Signed)

## 2023-10-14 ENCOUNTER — Telehealth: Payer: Self-pay

## 2023-10-14 DIAGNOSIS — G20A1 Parkinson's disease without dyskinesia, without mention of fluctuations: Secondary | ICD-10-CM

## 2023-10-14 DIAGNOSIS — M5430 Sciatica, unspecified side: Secondary | ICD-10-CM

## 2023-10-14 DIAGNOSIS — M5126 Other intervertebral disc displacement, lumbar region: Secondary | ICD-10-CM

## 2023-10-14 DIAGNOSIS — M1990 Unspecified osteoarthritis, unspecified site: Secondary | ICD-10-CM

## 2023-10-14 DIAGNOSIS — M47816 Spondylosis without myelopathy or radiculopathy, lumbar region: Secondary | ICD-10-CM

## 2023-10-14 NOTE — Telephone Encounter (Signed)
 Unfortunately there is nothing I can do about the prescription. I can refer him to another pain management, but I do want to let them know that it usually takes a few months to get in with a new pain management office- I would advise them to continue to follow with current pain management until they get in with the new one. Referral placed.

## 2023-10-14 NOTE — Telephone Encounter (Signed)
 Copied from CRM [id number]. Topic: Clinical - Prescription Issue >> Oct 14, 2023 12:35 PM Tobias L wrote: Reason for CRM: [facility]. Wife and patient have concerns about his service there, patient's medication (hydrocodone ) has been changed from 7mg  to 10mg . The office sent Rx to pharmacy for 7mg , patient's wife spoke to pharmacy about wrong rx being sent. Wife advised by pharmacy that they could not fill 10mg  Rx until patient had returned 7mg  Rx. Wife states they went to office in [place] and had the paper signed stating they returned the 7mg  Rx.   Wife called pain and management for 10mg  Rx to be sent to pharmacy however they have not heard back from office nor pharmacy that the request has been completed.   Wife inquiring if Dr. Vicci could assist in any way for patient to be able to get prescription or if patient should be referred elsewhere. Patient close to running out of hydrocodone , patient is down to 2-3 pills.

## 2023-10-14 NOTE — Telephone Encounter (Signed)
 Copied from CRM [id number]. Topic: General - Other >> Oct 14, 2023  1:58 PM Sasha H wrote: Reason for CRM: Pt's wife called in stating that they are not satisfied with [facility] at all and are wanting a different referral

## 2023-10-20 NOTE — Telephone Encounter (Signed)
 Spoke with patient and went over information from provider. He understands why PCP can not write his pain management prescriptions and that he should wait to stop seeing the clinic until he is seen elsewhere so that he does not have any situations occur in the interim and then have no access to medication. He would like to discuss with his wife and will call the office back if/when he needs further assistance.

## 2023-10-29 ENCOUNTER — Ambulatory Visit (INDEPENDENT_AMBULATORY_CARE_PROVIDER_SITE_OTHER): Admitting: Nurse Practitioner

## 2023-10-29 ENCOUNTER — Encounter: Payer: Self-pay | Admitting: Nurse Practitioner

## 2023-10-29 VITALS — BP 120/79 | HR 53 | Temp 98.2°F | Wt 221.0 lb

## 2023-10-29 DIAGNOSIS — I10 Essential (primary) hypertension: Secondary | ICD-10-CM | POA: Diagnosis not present

## 2023-10-29 MED ORDER — AMLODIPINE BESYLATE 2.5 MG PO TABS: 2.5000 mg | ORAL_TABLET | Freq: Every day | ORAL | Status: DC

## 2023-10-29 MED ORDER — LISINOPRIL 10 MG PO TABS: 10.0000 mg | ORAL_TABLET | Freq: Every day | ORAL | 0 refills | Status: DC

## 2023-10-29 NOTE — Progress Notes (Signed)
 BP 120/79   Pulse (!) 53   Temp 98.2 F (36.8 C) (Oral)   Wt 221 lb (100.2 kg)   SpO2 98%   BMI 32.64 kg/m    Subjective:    Patient ID: Thomas [name], male    DOB: [birth year], 73 y.o.   MRN: [id number]  HPI: Thomas [name] is a 73 y.o. male  Chief Complaint  Patient presents with   Dizziness   Patient is having dizziness that has been ongoing for a couple of weeks.  Wife brought blood pressures that are typically less than <130/80.  Neurologist would like it around the 130s.  He has been having dizziness and had a fall.  He stopped taking the Lisinopril  one night and his blood pressure 90/50.  He has fallen a couple of times due to low blood pressures.      Relevant past medical, surgical, family and social history reviewed and updated as indicated. Interim medical history since our last visit reviewed. Allergies and medications reviewed and updated.  Review of Systems  Neurological:  Positive for dizziness.    Per HPI unless specifically indicated above     Objective:    BP 120/79   Pulse (!) 53   Temp 98.2 F (36.8 C) (Oral)   Wt 221 lb (100.2 kg)   SpO2 98%   BMI 32.64 kg/m   Wt Readings from Last 3 Encounters:  10/29/23 221 lb (100.2 kg)  10/13/23 217 lb (98.4 kg)  10/09/23 250 lb (113.4 kg)    Physical Exam Vitals and nursing note reviewed.  Constitutional:      General: He is not in acute distress.    Appearance: Normal appearance. He is not ill-appearing, toxic-appearing or diaphoretic.  HENT:     Head: Normocephalic.     Right Ear: External ear normal.     Left Ear: External ear normal.     Nose: Nose normal. No congestion or rhinorrhea.     Mouth/Throat:     Mouth: Mucous membranes are moist.  Eyes:     General:        Right eye: No discharge.        Left eye: No discharge.     Extraocular Movements: Extraocular movements intact.     Conjunctiva/sclera: Conjunctivae normal.     Pupils: Pupils are equal, round, and reactive to light.   Cardiovascular:     Rate and Rhythm: Normal rate and regular rhythm.     Heart sounds: No murmur heard. Pulmonary:     Effort: Pulmonary effort is normal. No respiratory distress.     Breath sounds: Normal breath sounds. No wheezing, rhonchi or rales.  Abdominal:     General: Abdomen is flat. Bowel sounds are normal.  Musculoskeletal:     Cervical back: Normal range of motion and neck supple.  Skin:    General: Skin is warm and dry.     Capillary Refill: Capillary refill takes less than 2 seconds.  Neurological:     General: No focal deficit present.     Mental Status: He is alert and oriented to person, place, and time.  Psychiatric:        Mood and Affect: Mood normal.        Behavior: Behavior normal.        Thought Content: Thought content normal.        Judgment: Judgment normal.     Results for orders placed or performed in visit on 08/25/23  HM COLONOSCOPY  Collection Time: 10/15/22 12:00 AM  Result Value Ref Range   HM Colonoscopy See Report (in chart) See Report (in chart), Patient Reported      Assessment & Plan:   Problem List Items Addressed This Visit       Cardiovascular and Mediastinum   Hypertension - Primary   Chronic.  Not well controlled.  Blood pressures have been running low.  Continue with Amlodipine  2.5mg  daily.  Will decrease Lisinopril  to 10mg  daily.  Follow up in 1 month with PCP.       Relevant Medications   amLODipine  (NORVASC ) 2.5 MG tablet   lisinopril  (ZESTRIL ) 10 MG tablet     Follow up plan: Return in about 1 month (around 11/29/2023) for BP Check.

## 2023-10-29 NOTE — Assessment & Plan Note (Signed)
 Chronic.  Not well controlled.  Blood pressures have been running low.  Continue with Amlodipine  2.5mg  daily.  Will decrease Lisinopril  to 10mg  daily.  Follow up in 1 month with PCP.

## 2023-11-13 ENCOUNTER — Ambulatory Visit: Admitting: Physician Assistant

## 2023-11-16 ENCOUNTER — Ambulatory Visit: Admitting: Physician Assistant

## 2023-11-16 DIAGNOSIS — N3941 Urge incontinence: Secondary | ICD-10-CM

## 2023-11-16 NOTE — Progress Notes (Signed)
 PTNS  Session # 15  Health & Social Factors: no change Caffeine: 1-2 Alcohol : 0 Daytime voids #per day: 5-6 Night-time voids #per night: 2-3 Urgency: mild Incontinence Episodes #per day: 0 Ankle used: right Treatment Setting: 9 Feeling/ Response: sensory Comments: Patient tolerated well.  Performed By: Lyrical Sowle, PA-C   Follow Up: Return in about 4 weeks (around 12/14/2023) for PTNS maintenance.

## 2023-11-27 ENCOUNTER — Ambulatory Visit: Admitting: Family Medicine

## 2023-11-28 ENCOUNTER — Other Ambulatory Visit: Payer: Self-pay | Admitting: Nurse Practitioner

## 2023-11-30 ENCOUNTER — Encounter: Payer: Self-pay | Admitting: Family Medicine

## 2023-11-30 ENCOUNTER — Ambulatory Visit (INDEPENDENT_AMBULATORY_CARE_PROVIDER_SITE_OTHER): Admitting: Family Medicine

## 2023-11-30 ENCOUNTER — Telehealth: Payer: Self-pay | Admitting: Urology

## 2023-11-30 VITALS — BP 117/74 | HR 51 | Ht 69.0 in | Wt 212.8 lb

## 2023-11-30 DIAGNOSIS — I10 Essential (primary) hypertension: Secondary | ICD-10-CM | POA: Diagnosis not present

## 2023-11-30 DIAGNOSIS — R634 Abnormal weight loss: Secondary | ICD-10-CM

## 2023-11-30 DIAGNOSIS — G20A1 Parkinson's disease without dyskinesia, without mention of fluctuations: Secondary | ICD-10-CM | POA: Diagnosis not present

## 2023-11-30 MED ORDER — LISINOPRIL 5 MG PO TABS: 5.0000 mg | ORAL_TABLET | Freq: Every day | ORAL | 0 refills | Status: DC

## 2023-11-30 NOTE — Assessment & Plan Note (Signed)
 Having trouble swallowing due to head positioning and weakness. Will get him into speech therapy. Call with any concerns.

## 2023-11-30 NOTE — Assessment & Plan Note (Signed)
 Running low. Will cut his lisinopril  to 5mg  and recheck in 1 month. Continue to monitor closely. Call with any concerns.

## 2023-11-30 NOTE — Telephone Encounter (Signed)
 Patient's wife called and is wanting to know if a stronger medication other than OXYBUTYNIN  can be prescribed to patient. Wife states that patient is having more bladder control issues. Please advise.

## 2023-11-30 NOTE — Progress Notes (Signed)
 BP 117/74   Pulse (!) 51   Ht 5' 9 (1.753 m)   Wt 212 lb 12.8 oz (96.5 kg)   SpO2 96%   BMI 31.43 kg/m    Subjective:    Patient ID: Thomas [name], male    DOB: [birth year], 73 y.o.   MRN: [id number]  HPI: Thomas [name] is a 73 y.o. male  Chief Complaint  Patient presents with   Hypertension   Weight Loss   HYPERTENSION- BP has been low at home- down into the 80s/50s, highest was 159/100  Hypertension status: running low  Satisfied with current treatment? no Duration of hypertension: chronic BP monitoring frequency:  a few times a day BP medication side effects:  no Medication compliance: excellent compliance Previous BP meds:amlodipine , lisinopril , hydralazine  Aspirin: no Recurrent headaches: no Visual changes: no Palpitations: no Dyspnea: no Chest pain: no Lower extremity edema: no Dizzy/lightheaded: yes  WEIGHT LOSS- has been having more trouble feeding himself with the tremors. Has also been having less of an appetite as food doesn't taste as good Duration:about a month Amount of weight loss: about 9 lbs Fevers: no Decreased appetite: yes Night sweats: no Dysphagia/odynophagia: no Chest pain: no Shortness of breath: no Cough: no Nausea: no Vomiting: no Abdominal pain: no Blood in stool: no Easy bruising/bleeding: no Jaundice: no Polydipsia/polyuria: no Depression: yes Previous colonoscopy: yes  Relevant past medical, surgical, family and social history reviewed and updated as indicated. Interim medical history since our last visit reviewed. Allergies and medications reviewed and updated.  Review of Systems  Constitutional: Negative.   Respiratory: Negative.    Musculoskeletal:  Positive for back pain and myalgias. Negative for arthralgias, gait problem, joint swelling, neck pain and neck stiffness.  Skin: Negative.   Neurological:  Positive for tremors, weakness and numbness. Negative for dizziness, seizures, syncope, facial asymmetry, speech  difficulty, light-headedness and headaches.  Psychiatric/Behavioral: Negative.      Per HPI unless specifically indicated above     Objective:    BP 117/74   Pulse (!) 51   Ht 5' 9 (1.753 m)   Wt 212 lb 12.8 oz (96.5 kg)   SpO2 96%   BMI 31.43 kg/m   Wt Readings from Last 3 Encounters:  11/30/23 212 lb 12.8 oz (96.5 kg)  10/29/23 221 lb (100.2 kg)  10/13/23 217 lb (98.4 kg)    Physical Exam Vitals and nursing note reviewed.  Constitutional:      General: He is not in acute distress.    Appearance: Normal appearance. He is not ill-appearing, toxic-appearing or diaphoretic.  HENT:     Head: Normocephalic and atraumatic.     Right Ear: External ear normal.     Left Ear: External ear normal.     Nose: Nose normal.     Mouth/Throat:     Mouth: Mucous membranes are moist.     Pharynx: Oropharynx is clear.  Eyes:     General: No scleral icterus.       Right eye: No discharge.        Left eye: No discharge.     Extraocular Movements: Extraocular movements intact.     Conjunctiva/sclera: Conjunctivae normal.     Pupils: Pupils are equal, round, and reactive to light.  Cardiovascular:     Rate and Rhythm: Normal rate and regular rhythm.     Pulses: Normal pulses.     Heart sounds: Normal heart sounds. No murmur heard.    No friction rub. No gallop.  Pulmonary:     Effort: Pulmonary effort is normal. No respiratory distress.     Breath sounds: Normal breath sounds. No stridor. No wheezing, rhonchi or rales.  Chest:     Chest wall: No tenderness.  Musculoskeletal:        General: Normal range of motion.     Cervical back: Normal range of motion and neck supple.  Skin:    General: Skin is warm and dry.     Capillary Refill: Capillary refill takes less than 2 seconds.     Coloration: Skin is not jaundiced or pale.     Findings: No bruising, erythema, lesion or rash.  Neurological:     General: No focal deficit present.     Mental Status: He is alert and oriented to  person, place, and time. Mental status is at baseline.  Psychiatric:        Mood and Affect: Mood normal.        Behavior: Behavior normal.        Thought Content: Thought content normal.        Judgment: Judgment normal.     Results for orders placed or performed in visit on 08/25/23  HM COLONOSCOPY   Collection Time: 10/15/22 12:00 AM  Result Value Ref Range   HM Colonoscopy See Report (in chart) See Report (in chart), Patient Reported      Assessment & Plan:   Problem List Items Addressed This Visit       Cardiovascular and Mediastinum   Hypertension   Running low. Will cut his lisinopril  to 5mg  and recheck in 1 month. Continue to monitor closely. Call with any concerns.       Relevant Medications   hydrALAZINE  (APRESOLINE ) 25 MG tablet   lisinopril  (ZESTRIL ) 5 MG tablet   Other Relevant Orders   AMB Referral [facility]     Nervous and Auditory   Parkinson's disease (HCC)   Having trouble swallowing due to head positioning and weakness. Will get him into speech therapy. Call with any concerns.       Relevant Orders   AMB Referral [facility]   Ambulatory referral to Speech Therapy   Other Visit Diagnoses       Weight loss    -  Primary   Encouraged starting ensures. Will recheck weight in about a month. Call with any concerns.        Follow up plan: Return for As scheduled.

## 2023-12-01 ENCOUNTER — Telehealth: Payer: Self-pay

## 2023-12-01 NOTE — Telephone Encounter (Signed)
 Requested Prescriptions  Pending Prescriptions Disp Refills   XARELTO  20 MG TABS tablet [Pharmacy Med Name: XARELTO  20 MG TABLET] 90 tablet 0    Sig: TAKE 1 TABLET BY MOUTH DAILY     Hematology: Anticoagulants - rivaroxaban  Passed - 12/01/2023 12:49 PM      Passed - ALT in normal range and within 360 days    ALT  Date Value Ref Range Status  08/24/2023 8 0 - 44 IU/L Final         Passed - AST in normal range and within 360 days    AST  Date Value Ref Range Status  08/24/2023 14 0 - 40 IU/L Final         Passed - Cr in normal range and within 360 days    Creatinine, Ser  Date Value Ref Range Status  08/24/2023 1.01 0.76 - 1.27 mg/dL Final         Passed - HCT in normal range and within 360 days    Hematocrit  Date Value Ref Range Status  08/24/2023 47.5 37.5 - 51.0 % Final         Passed - HGB in normal range and within 360 days    Hemoglobin  Date Value Ref Range Status  08/24/2023 15.1 13.0 - 17.7 g/dL Final         Passed - PLT in normal range and within 360 days    Platelets  Date Value Ref Range Status  08/24/2023 160 150 - 450 x10E3/uL Final         Passed - eGFR is 15 or above and within 360 days    GFR calc Af Amer  Date Value Ref Range Status  12/27/2019 91 >59 mL/min/1.73 Final    Comment:    **[facility] currently reports eGFR in compliance with the current**   recommendations of the [employer]. [facility] will   update reporting as new guidelines are published from the NKF-ASN   Task force.    GFR, Estimated  Date Value Ref Range Status  10/18/2022 >60 >60 mL/min Final    Comment:    (NOTE) Calculated using the CKD-EPI Creatinine Equation (2021)    eGFR  Date Value Ref Range Status  08/24/2023 78 >59 mL/min/1.73 Final         Passed - Patient is not pregnant      Passed - Valid encounter within last 12 months    Recent Outpatient Visits           Yesterday Weight loss   [place] [facility] [place], Megan P,  DO   1 month ago Primary hypertension   [place] [facility] Melvin Pao, NP   3 months ago Encounter for annual wellness visit (AWV) in Medicare patient   [place] [facility] [place], Duwaine SQUIBB, DO       Future Appointments             In 2 weeks Vaillancourt, Samantha, PA-C [facility]   In 1 month Gollan, Timothy J, MD [facility] [place]   In 10 months Hester Alm BROCKS, MD [facility]

## 2023-12-01 NOTE — Progress Notes (Signed)
 Care Guide Pharmacy Note  12/01/2023 Name: Thomas [name] MRN: [id number] DOB: 1950/07/17  Referred By: Vicci Duwaine SQUIBB, DO Reason for referral: Complex Care Management (Outreach to schedule with Pharm d )   Thomas [name] is a 73 y.o. year old male who is a primary care patient of Vicci Duwaine SQUIBB, DO.  Thomas [name] was referred to the pharmacist for assistance related to: HTN  Successful contact was made with the patient to discuss pharmacy services including being ready for the pharmacist to call at least 5 minutes before the scheduled appointment time and to have medication bottles and any blood pressure readings ready for review. The patient agreed to meet with the pharmacist via telephone visit on (date/time).12/08/2023  Jeoffrey Buffalo , RMA     [place]  [facility] Guide  Direct Dial: [phone]  Website: [email or web address]

## 2023-12-02 DIAGNOSIS — M47816 Spondylosis without myelopathy or radiculopathy, lumbar region: Secondary | ICD-10-CM | POA: Diagnosis not present

## 2023-12-04 ENCOUNTER — Other Ambulatory Visit: Payer: Self-pay

## 2023-12-04 NOTE — Telephone Encounter (Signed)
 Okay to offer him Gemtesa samples and have him follow-up with Dr. Gaston.

## 2023-12-08 ENCOUNTER — Other Ambulatory Visit: Payer: Self-pay

## 2023-12-08 MED ORDER — GEMTESA 75 MG PO TABS: 75.0000 mg | ORAL_TABLET | Freq: Every day | ORAL | Status: AC

## 2023-12-08 NOTE — Telephone Encounter (Signed)
 Pt's wife informed. Appt scheduled.

## 2023-12-08 NOTE — Progress Notes (Signed)
 12/08/2023 Name: Thomas [name] MRN: [id number] DOB: 02-Apr-1951  Chief Complaint  Patient presents with   Medication Management   Thomas [name] is a 73 y.o. year old male who presented for a telephone visit.   They were referred to the pharmacist by their PCP for assistance in managing hypertension.   Subjective:  Care Team: Primary Care Provider: Vicci Duwaine SQUIBB, DO ; Next Scheduled Visit: 9/17  Medication Access/Adherence  Current Pharmacy:  [facility] [id number] - [place], [place] - [name] [place] [address] Phone: [phone] Fax: [phone]  [facility] [address] [place] - [address] [place] [address] Phone: [phone] Fax: [phone]  [facility] - [place], [place] - [address] [place] [address] Phone: [phone] Fax: [phone]  -Patient reports affordability concerns with their medications: No  -Patient reports access/transportation concerns to their pharmacy: No  -Patient reports adherence concerns with their medications:  No    Hypertension: Current medications: amlodipine  2.5mg  daily, lisinopril  5mg  daily, metoprolol  xl 25mg  daily -Patient is also prescribed hydralazine  25mg  daily, and this is given if SBP is >150 -Patient has a validated, automated, upper arm home BP cuff -Current blood pressure readings readings: fluctuate throughout the day with recent readings varying between 89/45-167/81; HR is typically 40-50 (rarely 60).  Patient/wife endorse proper BP technique and will check readings 2-3 times daily. -All BP medications are currently being given at 10pm, and morning readings tend to be lower than evening readings -Patient denies hypotensive [name]/sx including dizziness, lightheadedness.  However, he did have a recent fall around 4am the morning of 7/3 where he felt dizzy and fell backwards.  Wife checked  BP after fall, and it ws 186/68. -Patient denies hypertensive symptoms including headache, chest pain, shortness of breath -Current meal patterns: does not monitor salt intake, but if BP is low will have a salty snack- patient endorses rare caffeine intake and drinks approximately 36oz of water daily along with milk and juice -Current physical activity: boxing -Patient is also on several other medications that can affect BP:  levodopa , baclofen , tamsulosin  can decrease blood pressure; ropinorole and oxybutynin  can both increase or decrease blood pressure (orthostatic hypotension); amantadine  has also been shown to cause orthostatic hypotension in patients.  All of these medications are being given at different times throughout the day, some multiple times daily.  Objective:  Lab Results  Component Value Date   CREATININE 1.01 08/24/2023   BUN 16 08/24/2023   NA 139 08/24/2023   K 4.2 08/24/2023   CL 103 08/24/2023   CO2 20 08/24/2023   Lab Results  Component Value Date   CHOL 131 08/24/2023   HDL 53 08/24/2023   LDLCALC 66 08/24/2023   TRIG 55 08/24/2023   Medications Reviewed Today     Reviewed by Deanna Channing LABOR, RPH (Pharmacist) on 12/08/23 at 1513  Med List Status: <None>   Medication Order Taking? Sig Documenting Provider Last Dose Status Informant  acetaminophen  (TYLENOL ) 500 MG tablet [id number] Yes 1 to 2 tablets as needed [provider]  Active Spouse/Significant Other  amantadine  (SYMMETREL ) 100 MG capsule [id number] Yes TAKE 1 CAPSULE BY MOUTH 3 TIMES A DAY Tat, Rebecca S, DO  Active   amLODipine  (NORVASC ) 2.5 MG tablet [id number] Yes Take 1 tablet (2.5 mg total) by mouth daily. Thomas Pao, NP  Active   baclofen  (LIORESAL ) 10 MG  tablet [id number] Yes Take 1 tablet (10 mg total) by mouth 3 (three) times daily.  Patient taking differently: Take 10 mg by mouth 2 (two) times daily.   Johnson, Megan P, DO  Active   carbidopa -levodopa  (SINEMET  CR) 50-200 MG  tablet [id number] Yes TAKE 1 TABLET BY MOUTH AT BEDTIME Tat, Asberry RAMAN, DO  Active   carbidopa -levodopa  (SINEMET  IR) 25-100 MG tablet [id number] Yes TAKE 2 TABLETS BY MOUTH 4 TIMES A DAY AT 6:00AM AND 10:00AM ,IN THE AFTERNOON AND 6:00PM Tat, Asberry RAMAN, DO  Active   cetirizine (ZYRTEC) 10 MG tablet [id number] Yes Take 10 mg by mouth at bedtime. [provider]  Active Spouse/Significant Other  cholecalciferol  (VITAMIN D3) 25 MCG (1000 UNIT) tablet [id number] Yes Take 1,000 Units by mouth daily.  Patient taking differently: Take 1,000 Units by mouth. Twice  a week   [provider]  Active Spouse/Significant Other  cyanocobalamin (VITAMIN B12) 1000 MCG tablet [id number] Yes Take 1,000 mcg by mouth daily. [provider]  Active   diazepam  (VALIUM ) 10 MG tablet [id number]  1 tablet Orally to be taken 30 minutes prior to scheduled procedure; Duration: 1 days [provider]  Active   diclofenac  Sodium (VOLTAREN ) 1 % GEL [id number] Yes APPLY TOPICALLY 4 GRAMS FOUR TIMES DAILY Johnson, Megan P, DO  Active Spouse/Significant Other  DULoxetine  (CYMBALTA ) 20 MG capsule [id number] Yes Take 1 capsule (20 mg total) by mouth every other day. Johnson, Megan P, DO  Active   folic acid (FOLVITE) 800 MCG tablet [id number] Yes Take 400 mcg by mouth daily.  Patient taking differently: Take 800 mcg by mouth daily.   [provider]  Active   hydrALAZINE  (APRESOLINE ) 25 MG tablet [id number] Yes PRN [provider]  Active   HYDROcodone -acetaminophen  (NORCO/VICODIN) 5-325 MG tablet [id number] Yes Take 1 tablet by mouth 2 (two) times daily. [provider]  Active Spouse/Significant Other  Levodopa  (INBRIJA ) 42 MG CAPS [id number] Yes Samples of this drug were given to the patient, quantity 1 box of 32 capsules, Lot Number [id number] Exp: 03/2023  Patient has been educated on dosage and uses of medication. Thomas Asberry RAMAN, DO  Active Spouse/Significant Other  Levodopa   (INBRIJA ) 42 MG CAPS [id number] Yes Samples of this drug were given to the patient, quantity 1, Lot Number [id number] Exp 12-24 Tat, Rebecca S, DO  Active Spouse/Significant Other  Levodopa  (INBRIJA ) 42 MG CAPS [id number] Yes TWO capsules is ONE dosage (never inhale just one capsule).  You can inhale the capsules as needed up to 5 times per day, separated by 2 hour intervals. Thomas Asberry RAMAN, DO  Active   lisinopril  (ZESTRIL ) 5 MG tablet [id number] Yes Take 1 tablet (5 mg total) by mouth daily. Johnson, Megan P, DO  Active   melatonin 5 MG TABS [id number] Yes Take 5 mg by mouth at bedtime. [provider]  Active Spouse/Significant Other  metoprolol  succinate (TOPROL -XL) 25 MG 24 hr tablet [id number] Yes Take 1 tablet (25 mg total) by mouth at bedtime. Johnson, Megan P, DO  Active   mometasone  (ELOCON ) 0.1 % cream [id number]  Apply twice daily to affected areas on back for dermatitis. Avoid applying to face, groin, and axilla. Hester Alm BROCKS, MD  Active   Multiple Vitamin (MULTIVITAMIN) tablet [id number] Yes Take 1 tablet by mouth daily at 6 PM. [provider]  Active Spouse/Significant Other  oxybutynin  (DITROPAN -XL) 10 MG 24 hr tablet [id number] Yes Take 1 tablet (10 mg total) by mouth at bedtime.  MacDiarmid, Scott, MD  Active   polyethylene glycol powder (GLYCOLAX /MIRALAX ) 17 GM/SCOOP powder [id number] Yes Take 17 g by mouth at bedtime. Jens Durand, MD  Active            Med Note BEVERLEE JUDGE LITTIE Pablo Dec 08, 2022  3:36 PM)    PREVIDENT 5000 BOOSTER PLUS 1.1 % PSTE [id number] Yes Place 1 application onto teeth 2 (two) times daily. [provider]  Active Spouse/Significant Other  pyridoxine (B-6) 100 MG tablet [id number] Yes Take 100 mg by mouth daily. [provider]  Active   rOPINIRole  (REQUIP ) 2 MG tablet [id number] Yes Take 2 mg 3 times per day. Thomas Asberry RAMAN, DO  Active   simvastatin  (ZOCOR ) 40 MG tablet [id number] Yes Take 1 tablet (40 mg total) by mouth  daily. Johnson, Megan P, DO  Active   tamsulosin  (FLOMAX ) 0.4 MG CAPS capsule [id number] Yes Take 2 capsules (0.8 mg total) by mouth daily. Vicci, Megan P, DO  Active   traZODone  (DESYREL ) 50 MG tablet [id number] Yes Take 1 tablet (50 mg total) by mouth at bedtime. Vicci, Megan P, DO  Active   XARELTO  20 MG TABS tablet [id number] Yes TAKE 1 TABLET BY MOUTH DAILY Cannady, Jolene T, NP  Active            Assessment/Plan:   Hypertension: -Currently uncontrolled -I recommend moving amlodipine  2.5mg  to 10am versus 10pm since all BP medications are currently being given at night -Continue to monitor and record BP readings 2-3x/d and bring to follow-up visit with Dr. Vicci on 9/17 -If numbers continue to run higher later in the day, we could also move lisinopril  5mg  to morning dosing -Increase water intake to help prevent orthostatic hypotension  Follow Up Plan: 10/8  [name], PharmD, DPLA

## 2023-12-09 ENCOUNTER — Other Ambulatory Visit: Payer: Self-pay | Admitting: Neurology

## 2023-12-09 ENCOUNTER — Telehealth: Payer: Self-pay | Admitting: Neurology

## 2023-12-09 DIAGNOSIS — G20A1 Parkinson's disease without dyskinesia, without mention of fluctuations: Secondary | ICD-10-CM

## 2023-12-09 NOTE — Telephone Encounter (Signed)
 Pt wants a call bk but would not tell me why

## 2023-12-09 NOTE — Telephone Encounter (Signed)
 Call was in regards to a referral for another/ Person besides patient.    Advised to have PCP to send over referral and the provider will review and go the to the correct provider.    Per wife this has been  discussed with Dr.Tat already and we prefer this happens to go to her.

## 2023-12-09 NOTE — Telephone Encounter (Signed)
 LMOVM for patient and daughter to call back.

## 2023-12-16 ENCOUNTER — Ambulatory Visit: Admitting: Physician Assistant

## 2023-12-18 ENCOUNTER — Ambulatory Visit: Admitting: Physician Assistant

## 2023-12-18 DIAGNOSIS — N3941 Urge incontinence: Secondary | ICD-10-CM

## 2023-12-18 NOTE — Progress Notes (Signed)
 PTNS  Session # 16  Health & Social Factors: no change Caffeine: 0 Alcohol : 0 Daytime voids #per day: 6 Night-time voids #per night: 2-3 Urgency: strong Incontinence Episodes #per day: 1 Ankle used: right Treatment Setting: 11 Feeling/ Response: sensory Comments: Patient tolerated well.  Performed By: Tashi Andujo, PA-C   Additional notes: They feel PTNS is not working and wish to discontinue after this session. Will have them follow up with Dr. MacDiarmid as scheduled.  Follow Up: Return in about 2 months (around 02/17/2024) for Follow up with Dr. Gaston.

## 2023-12-21 ENCOUNTER — Ambulatory Visit: Admitting: Family Medicine

## 2023-12-26 ENCOUNTER — Other Ambulatory Visit: Payer: Self-pay | Admitting: Family Medicine

## 2023-12-28 NOTE — Telephone Encounter (Signed)
 Requested Prescriptions  Pending Prescriptions Disp Refills   DULoxetine  (CYMBALTA ) 20 MG capsule [Pharmacy Med Name: DULoxetine  HCL DR 20 MG CAPSULE] 45 capsule 0    Sig: TAKE 1 CAPSULE BY MOUTH EVERY OTHER DAY     Psychiatry: Antidepressants - SNRI - duloxetine  Passed - 12/28/2023  3:05 PM      Passed - Cr in normal range and within 360 days    Creatinine, Ser  Date Value Ref Range Status  08/24/2023 1.01 0.76 - 1.27 mg/dL Final         Passed - eGFR is 30 or above and within 360 days    GFR calc Af Amer  Date Value Ref Range Status  12/27/2019 91 >59 mL/min/1.73 Final    Comment:    **[facility] currently reports eGFR in compliance with the current**   recommendations of the [employer]. [facility] will   update reporting as new guidelines are published from the NKF-ASN   Task force.    GFR, Estimated  Date Value Ref Range Status  10/18/2022 >60 >60 mL/min Final    Comment:    (NOTE) Calculated using the CKD-EPI Creatinine Equation (2021)    eGFR  Date Value Ref Range Status  08/24/2023 78 >59 mL/min/1.73 Final         Passed - Completed PHQ-2 or PHQ-9 in the last 360 days      Passed - Last BP in normal range    BP Readings from Last 1 Encounters:  11/30/23 117/74         Passed - Valid encounter within last 6 months    Recent Outpatient Visits           4 weeks ago Weight loss   [place] [facility], Megan P, DO   2 months ago Primary hypertension   [place] [facility] Melvin Pao, NP   4 months ago Encounter for annual wellness visit (AWV) in Medicare patient   [facility] [place], [place], DO       Future Appointments             In 4 weeks Gollan, Timothy J, MD [facility] [place]   In 2 months MacDiarmid, [place], MD [facility]   In 9 months Hester Alm BROCKS, MD [facility]

## 2023-12-29 ENCOUNTER — Other Ambulatory Visit: Payer: Self-pay | Admitting: Neurology

## 2023-12-29 DIAGNOSIS — G20A1 Parkinson's disease without dyskinesia, without mention of fluctuations: Secondary | ICD-10-CM

## 2023-12-30 ENCOUNTER — Encounter: Payer: Self-pay | Admitting: Family Medicine

## 2023-12-30 ENCOUNTER — Ambulatory Visit (INDEPENDENT_AMBULATORY_CARE_PROVIDER_SITE_OTHER): Admitting: Family Medicine

## 2023-12-30 VITALS — BP 111/70 | HR 55 | Temp 98.0°F | Ht 69.0 in | Wt 210.6 lb

## 2023-12-30 DIAGNOSIS — G20A1 Parkinson's disease without dyskinesia, without mention of fluctuations: Secondary | ICD-10-CM | POA: Diagnosis not present

## 2023-12-30 DIAGNOSIS — I10 Essential (primary) hypertension: Secondary | ICD-10-CM | POA: Diagnosis not present

## 2023-12-30 DIAGNOSIS — R634 Abnormal weight loss: Secondary | ICD-10-CM

## 2023-12-30 DIAGNOSIS — Z23 Encounter for immunization: Secondary | ICD-10-CM

## 2023-12-30 MED ORDER — BACLOFEN 10 MG PO TABS: 10.0000 mg | ORAL_TABLET | Freq: Three times a day (TID) | ORAL | 3 refills | Status: DC

## 2023-12-30 NOTE — Progress Notes (Signed)
 BP 111/70 (BP Location: Left Arm, Patient Position: Sitting, Cuff Size: Normal)   Pulse (!) 55   Temp 98 F (36.7 C) (Oral)   Ht 5' 9 (1.753 m)   Wt 210 lb 9.6 oz (95.5 kg)   SpO2 92%   BMI 31.10 kg/m    Subjective:    Patient ID: Thomas [name], male    DOB: 1950-07-15, 73 y.o.   MRN: [id number]  HPI: Thomas [name] is a 73 y.o. male  Chief Complaint  Patient presents with   Blood Pressure Check   Weight Loss   HYPERTENSION  Hypertension status: very labile  Satisfied with current treatment? no Duration of hypertension: chronic BP monitoring frequency:  a few times a day BP range: 80s/50s to 159/90 BP medication side effects:  no Medication compliance: excellent compliance Previous BP meds:lisinopril , amlodipine , hydralazine  PRN Aspirin: no Recurrent headaches: no Visual changes: no Palpitations: no Dyspnea: no Chest pain: no Lower extremity edema: yes Dizzy/lightheaded: yes  Has been drinking his ensures. Has not gotten into see speech therapy. Still having issues feeding himself. Also having trouble getting around and wife is no longer able to lift as much. Would like resources to help with lifts etc. No other concerns or complaints at this time.    Relevant past medical, surgical, family and social history reviewed and updated as indicated. Interim medical history since our last visit reviewed. Allergies and medications reviewed and updated.  Review of Systems  Constitutional:  Positive for unexpected weight change. Negative for activity change, appetite change, chills, diaphoresis, fatigue and fever.  Respiratory: Negative.    Cardiovascular: Negative.   Musculoskeletal: Negative.   Skin: Negative.   Neurological:  Positive for tremors and weakness. Negative for dizziness, seizures, syncope, facial asymmetry, speech difficulty, light-headedness, numbness and headaches.  Psychiatric/Behavioral: Negative.      Per HPI unless specifically indicated above      Objective:    BP 111/70 (BP Location: Left Arm, Patient Position: Sitting, Cuff Size: Normal)   Pulse (!) 55   Temp 98 F (36.7 C) (Oral)   Ht 5' 9 (1.753 m)   Wt 210 lb 9.6 oz (95.5 kg)   SpO2 92%   BMI 31.10 kg/m   Wt Readings from Last 3 Encounters:  12/30/23 210 lb 9.6 oz (95.5 kg)  11/30/23 212 lb 12.8 oz (96.5 kg)  10/29/23 221 lb (100.2 kg)    Physical Exam Vitals and nursing note reviewed.  Constitutional:      General: He is not in acute distress.    Appearance: Normal appearance. He is not ill-appearing, toxic-appearing or diaphoretic.  HENT:     Head: Normocephalic and atraumatic.     Right Ear: External ear normal.     Left Ear: External ear normal.     Nose: Nose normal.     Mouth/Throat:     Mouth: Mucous membranes are moist.     Pharynx: Oropharynx is clear.  Eyes:     General: No scleral icterus.       Right eye: No discharge.        Left eye: No discharge.     Extraocular Movements: Extraocular movements intact.     Conjunctiva/sclera: Conjunctivae normal.     Pupils: Pupils are equal, round, and reactive to light.  Cardiovascular:     Rate and Rhythm: Normal rate and regular rhythm.     Pulses: Normal pulses.     Heart sounds: Normal heart sounds. No murmur heard.  No friction rub. No gallop.  Pulmonary:     Effort: Pulmonary effort is normal. No respiratory distress.     Breath sounds: Normal breath sounds. No stridor. No wheezing, rhonchi or rales.  Chest:     Chest wall: No tenderness.  Musculoskeletal:        General: Normal range of motion.     Cervical back: Normal range of motion and neck supple.  Skin:    General: Skin is warm and dry.     Capillary Refill: Capillary refill takes less than 2 seconds.     Coloration: Skin is not jaundiced or pale.     Findings: No bruising, erythema, lesion or rash.  Neurological:     General: No focal deficit present.     Mental Status: He is alert and oriented to person, place, and time. Mental  status is at baseline.     Motor: Weakness present.     Gait: Gait abnormal.  Psychiatric:        Mood and Affect: Mood normal.        Behavior: Behavior normal.        Thought Content: Thought content normal.        Judgment: Judgment normal.     Results for orders placed or performed in visit on 08/25/23  HM COLONOSCOPY   Collection Time: 10/15/22 12:00 AM  Result Value Ref Range   [facility] Colonoscopy See Report (in chart) See Report (in chart), Patient Reported      Assessment & Plan:   Problem List Items Addressed This Visit       Cardiovascular and Mediastinum   Hypertension - Primary   Still running mainly low at home. Stop lisinopril . Continue PRN hydralazine  for highs. Call with any concerns.         Nervous and Auditory   Parkinson's disease (HCC)   Continue to follow with neurology. Having more issues with getting around- will get him hooked up with nursing and social work. Call with any concerns.       Relevant Orders   AMB Referral [facility]BCI Care Management   Other Visit Diagnoses       Weight loss       Has slowed down, but still losing. Would like to hold on medication for now. Encouraged them to reach out to speech. Call with any concerns. Recheck 2 months.     Needs flu shot       Flu shot given today.   Relevant Orders   Flu vaccine HIGH DOSE PF(Fluzone Trivalent) (Completed)        Follow up plan: No follow-ups on file.

## 2023-12-30 NOTE — Assessment & Plan Note (Signed)
 Continue to follow with neurology. Having more issues with getting around- will get him hooked up with nursing and social work. Call with any concerns.

## 2023-12-30 NOTE — Assessment & Plan Note (Signed)
 Still running mainly low at home. Stop lisinopril . Continue PRN hydralazine  for highs. Call with any concerns.

## 2024-01-01 ENCOUNTER — Telehealth: Payer: Self-pay

## 2024-01-01 NOTE — Progress Notes (Signed)
 Complex Care Management Note  Care Guide Note 01/01/2024 Name: Thomas [name] MRN: [id number] DOB: October 12, 1950  Thomas [name] is a 73 y.o. year old male who sees Vicci Duwaine SQUIBB, DO for primary care. I reached out to Cordella Freund by phone today to offer complex care management services.  Thomas [name] was given information about Complex Care Management services today including:   The Complex Care Management services include support from the care team which includes your Nurse Care Manager, Clinical Social Worker, or Pharmacist.  The Complex Care Management team is here to help remove barriers to the health concerns and goals most important to you. Complex Care Management services are voluntary, and the patient may decline or stop services at any time by request to their care team member.   Complex Care Management Consent Status: Patient agreed to services and verbal consent obtained.   Follow up plan:  Telephone appointment with complex care management team member scheduled for:  [facility] 01/11/2024 LCSW 01/21/2024  Encounter Outcome:  Patient Scheduled  Jeoffrey Buffalo , RMA     [place]  [facility] Guide  Direct Dial: [phone]  Website: [email or web address]

## 2024-01-02 ENCOUNTER — Other Ambulatory Visit: Payer: Self-pay | Admitting: Neurology

## 2024-01-02 DIAGNOSIS — G20B1 Parkinson's disease with dyskinesia, without mention of fluctuations: Secondary | ICD-10-CM

## 2024-01-02 DIAGNOSIS — G20A1 Parkinson's disease without dyskinesia, without mention of fluctuations: Secondary | ICD-10-CM

## 2024-01-02 DIAGNOSIS — G20B2 Parkinson's disease with dyskinesia, with fluctuations: Secondary | ICD-10-CM

## 2024-01-04 ENCOUNTER — Ambulatory Visit: Attending: Family Medicine

## 2024-01-04 DIAGNOSIS — R1310 Dysphagia, unspecified: Secondary | ICD-10-CM | POA: Diagnosis not present

## 2024-01-04 DIAGNOSIS — G20A1 Parkinson's disease without dyskinesia, without mention of fluctuations: Secondary | ICD-10-CM | POA: Insufficient documentation

## 2024-01-04 NOTE — Therapy (Signed)
 OUTPATIENT SPEECH LANGUAGE PATHOLOGY  SWALLOW EVALUATION   Patient Name: Thomas [name] MRN: [id number] DOB:Nov 25, 1950, 73 y.o., male Today's Date: 01/04/2024  PCP: Duwaine Louder, DO REFERRING PROVIDER: as above   End of Session - 01/04/24 0920     Visit Number 1    Number of Visits 1    SLP Start Time 0846    SLP Stop Time  0920    SLP Time Calculation (min) 34 min          Past Medical History:  Diagnosis Date   Activated protein C resistance 06/28/2019   Arthritis of knee    Arthropathy of lumbar facet joint    Atrial fibrillation    BPH (benign prostatic hyperplasia) 09/10/2018   DVT (deep venous thrombosis)    L leg   Dysplastic nevus 05/15/2021   Left Superior Pectoral, mod to severe, Excised 07/16/21   Erectile dysfunction 03/01/2015   Herniated lumbar disc without myelopathy 10/11/2020   Hyperchloremia    Hypercholesterolemia    Hypertension    Inability to walk 02/22/2021   Insomnia due to medical condition 09/30/2018   Major depressive disorder 01/22/2022   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 ENDOSCOPY;  Service: Gastroenterology;;   CATARACT EXTRACTION Bilateral 10/2019 11/2019   COLON RESECTION SIGMOID N/A 10/17/2022   Procedure: COLON RESECTION SIGMOID;  Surgeon: Desiderio Schanz, MD;  Location: [facility] ORS;  Service: General;  Laterality: N/A;   COLONOSCOPY WITH PROPOFOL  N/A 07/27/2019   Procedure: COLONOSCOPY WITH PROPOFOL ;  Surgeon: Unk Corinn Skiff, MD;  Location: [facility] ENDOSCOPY;  Service:  Endoscopy;  Laterality: N/A;   EYE SURGERY     FLEXIBLE SIGMOIDOSCOPY N/A 10/15/2022   Procedure: FLEXIBLE SIGMOIDOSCOPY;  Surgeon: Onita Elspeth Sharper, DO;  Location: [facility] ENDOSCOPY;  Service: Gastroenterology;  Laterality: N/A;   KNEE ARTHROSCOPY WITH MENISCAL REPAIR Right 2009   KNEE SURGERY Right    LUMBAR LAMINECTOMY/DECOMPRESSION MICRODISCECTOMY Left 10/11/2020   Procedure: Left Lumbar Three-Four, Lumbar Fou-Five  Microdiscectomy;  Surgeon: Unice Pac, MD;  Location: [facility] OR;  Service: Neurosurgery;  Laterality: Left;   melanoma removal Left 07/16/2021   chest   PAIN PUMP IMPLANTATION     T&A     TONSILLECTOMY     Patient Active Problem List   Diagnosis Date Noted   Idiopathic hypotension 11/14/2022   Sigmoid volvulus (HCC) 10/15/2022   Arthropathy of lumbar facet joint    Osteoarthritis 06/16/2022   TIA (transient ischemic attack) 06/16/2022   Sciatica 06/16/2022   Major depressive disorder 01/22/2022   Inability to walk 02/22/2021   Herniated lumbar disc without myelopathy 10/11/2020   Post-operative nausea and vomiting 09/20/2020   Activated protein C resistance (HCC) 06/28/2019   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   Insomnia due to medical condition 09/30/2018   BPH (benign prostatic hyperplasia) 09/10/2018   Atrial fibrillation (HCC) 08/23/2015   Erectile dysfunction 03/01/2015   Hypercholesterolemia    Hypertension    DVT (deep venous thrombosis) (HCC)  Parkinson's disease (HCC)     ONSET DATE: ~2 months ago   REFERRING DIAG: Parkinson's Disease, dysphagia  THERAPY DIAG:  Dysphagia, unspecified type  Parkinson's disease, unspecified whether dyskinesia present, unspecified whether manifestations fluctuate (HCC)  Rationale for Evaluation and Treatment Rehabilitation  SUBJECTIVE:   SUBJECTIVE STATEMENT: Pt alert, pleasant, and cooperative. Flat affect and dysphonia noted.  Pt accompanied by: significant  other  PERTINENT HISTORY & DIAGNOSTIC FINDINGS: Pt is a 73 y.o. male who presents for a swallowing evaluation in setting of Parkinson's Disease. Pt with complaints of difficulty swallowing due to weakness and head positioning. Pt with unintentional weight loss. Pt with PMHx: PD, HTN, MCI, orthostatic hypotension, lumbar stenosis and neural foraminal stenosis, sleep disturbance, and urinary incontinence. Lower chest findings on CT Abdomen Pelvis, 10/15/22, Lower chest: There is some linear opacity lung bases likely scar or atelectasis. No pleural effusion. Coronary artery calcifications are seen. Patulous lower esophagus. Head CT 02/22/21, No acute intracranial abnormality.  Mild generalized atrophy.  PAIN:  Are you having pain? No; FACES  FALLS: Has patient fallen in last 6 months?  Yes  LIVING ENVIRONMENT: Lives with: lives with their spouse Lives in: House/apartment    PATIENT GOALS  to have swallowing evaluation completed  OBJECTIVE:  RECOMMENDATIONS FROM OBJECTIVE SWALLOW STUDY (MBSS/FEES):  N/A   COGNITION: Overall cognitive status: Within functional limits for tasks assessed and dx of MCI   ORAL MOTOR EXAMINATION Facial : WFL Lingual: Strength Impaired: Impaired left, Impaired right Velum: WFL Mandible: WFL Cough: WFL Voice: Hoarse, Weak   CLINICAL SWALLOW ASSESSMENT:   Current diet: regular and thin liquids Dentition: adequate natural dentition Feeding: able to feed self with set up Consistencies tested: Thin Liquid: Presentation: Cup, Straw, and Self-fed Oral Phase: WFL Pharyngeal Phase: WFL Puree: Presentation: Spoon and Self-fed Oral Phase: WFL Regular: Presentation: By hand and Self-fed   Evaluation findings: Patient presents with oropharyngeal swallow which appears clinically to be within functional limits with adequate airway protection. Oral stage is characterized by appearance of adequate oral containment, mastication, bolus formation, oral transfer and  oral clearance. Swallow initiation appears timely. No overt signs of aspiration observed despite challenging with consecutive straw sips of thin liquids in excess of 3oz.  Aspiration risk factors:Neurological disease and Deconditioning Overall aspiration risk:No limitations and Risk for inadequate nutrition/hydration Diet Recommendations: regular and thin liquids Precautions:Seated upright 90 degrees and Remain upright for at least 30 minutes after meals; meds whole in carrier Supervision: Patient able to feed self and Comment: set up assistance Oral care recommendations:Oral care BID Follow-up recommendations: No treatment recommended and Other: Consider imaging for C-spine and RD consult     PATIENT REPORTED OUTCOME MEASURES (PROM):  EATING ASSESSMENT TOOL (EAT-10)   The patient was asked to rate to what extent the following statements are problematic on a scale of 0-4. 0 = No problem; 4 = Severe problem. A total score of 3 or higher is considered abnormal.  1.) My swallowing problem has caused me to lose weight. 0 2.) My swallowing problem interferes with my ability to go out for meals. 0 3.) Swallowing liquids takes extra effort. 2 4.) Swallowing solids takes extra effort. 2  5.) Swallowing pills takes extra effort. 2 (wife reports 3) 6.) Swallowing is painful. 0 7.) The pleasure of eating is affected by my swallowing. 2 (wife reports 3) 8.) When I swallow food sticks in my throat. 0 9.) I cough when I eat. 0 10.) Swallowing is stressful. 1   TOTAL SCORE:  9/40    TODAY'S TREATMENT:  Pt and wife made aware of role of SLP, results of assessment, phases of swallowing, current diet recommendations, safe swallowing strategies, consideration for meds in applesauce, instrumental assessment (e.g. what information can be gleaned from it), and SLP POC.    PATIENT EDUCATION: Education details: as above Person educated: Patient and Spouse Education method: Explanation Education  comprehension: verbalized understanding  HOME EXERCISE PROGRAM:     N/A   GOALS: N/A  ASSESSMENT:  CLINICAL IMPRESSION: Pt is a 73 y.o. male who presents for a swallowing evaluation in setting of Parkinson's Disease. Pt with complaints of difficulty swallowing due to weakness and head positioning. Pt with unintentional weight loss. Pt with PMHx, including but not limited to, PD, HTN, MCI, orthostatic hypotension, lumbar stenosis and neural foraminal stenosis, sleep disturbance, and urinary incontinence. Pt seen for clinical swallowing evaluation. Oral motor examination significant for dysphonia (hoarse, hypophonic vocal quality), lingual tremor with protrusion and lateralization as well as lingual weakness (?slightly weaker on L). Pt demonstrated adequate oropharyngeal swallow function per clinical assessment. Pt with tendency to keep neck in flexion and endorses difficulty tilting head back to drink from a near empty cup or to take pills. Reduced cervical ROM noted on exam. Pt encouraged to keep head in comfortable position for eating and drinking and to consider carrier use for pill administration. Recommend continuation of current diet with standard aspiration precautions. Pt and wife politely decline instrumental swallowing evaluation, and SLP in agreement. Recommend consideration for cervical spine imaging and RD consult given pt's complaints.  OBJECTIVE IMPAIRMENTS include dysphagia. These impairments are limiting patient from safety when swallowing. Factors affecting potential to achieve goals and functional outcome are co-morbidities. Patient will benefit from skilled SLP services to address above impairments and improve overall function.  REHAB POTENTIAL: Good  PLAN: No ST f/u at this time    Delon Bangs, M.S., CCC-SLP Speech-Language Pathologist [place] [facility] [phone] [place])  [facility] Outpatient Rehabilitation at  [facility] [address] [place], [place], [address] Phone: [phone]   Fax:  [phone]

## 2024-01-06 DIAGNOSIS — Z79891 Long term (current) use of opiate analgesic: Secondary | ICD-10-CM | POA: Diagnosis not present

## 2024-01-06 DIAGNOSIS — M545 Low back pain, unspecified: Secondary | ICD-10-CM | POA: Diagnosis not present

## 2024-01-06 DIAGNOSIS — G8929 Other chronic pain: Secondary | ICD-10-CM | POA: Diagnosis not present

## 2024-01-06 DIAGNOSIS — M533 Sacrococcygeal disorders, not elsewhere classified: Secondary | ICD-10-CM | POA: Diagnosis not present

## 2024-01-06 DIAGNOSIS — M47816 Spondylosis without myelopathy or radiculopathy, lumbar region: Secondary | ICD-10-CM | POA: Diagnosis not present

## 2024-01-06 DIAGNOSIS — G20A1 Parkinson's disease without dyskinesia, without mention of fluctuations: Secondary | ICD-10-CM | POA: Diagnosis not present

## 2024-01-11 ENCOUNTER — Other Ambulatory Visit: Payer: Self-pay

## 2024-01-11 NOTE — Patient Outreach (Signed)
 Complex Care Management   Visit Note  01/11/2024  Name:  Abhi Moccia MRN: [id number] DOB: [birth year]  Situation: Referral received for Complex Care Management related to transporting wheelchair and caregiver emergency plan. I obtained verbal consent from Caregiver Patient.  Visit completed with patient, Mr. Lovering and wife, Jaryan Chicoine  on the phone. Main concern is finding a way to install a type of lift on their Chevy Equinox to get wheelchair in and out of vehicle, and an emergency plan if something were to happen to patient's caregiver/wife where she couldn't take care of him.   Background:   Past Medical History:  Diagnosis Date   Activated protein C resistance 06/28/2019   Arthritis of knee    Arthropathy of lumbar facet joint    Atrial fibrillation    BPH (benign prostatic hyperplasia) 09/10/2018   DVT (deep venous thrombosis)    L leg   Dysplastic nevus 05/15/2021   Left Superior Pectoral, mod to severe, Excised 07/16/21   Erectile dysfunction 03/01/2015   Herniated lumbar disc without myelopathy 10/11/2020   Hyperchloremia    Hypercholesterolemia    Hypertension    Inability to walk 02/22/2021   Insomnia due to medical condition 09/30/2018   Major depressive disorder 01/22/2022   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   Nephrolithiasis    Osteoarthritis    Parkinson's disease (HCC)    Plantar wart    Pneumonia    Post-operative nausea and vomiting 09/20/2020   Pulmonary embolism    Sciatica    Seasonal allergies    TIA (transient ischemic attack)     Assessment: Patient Reported Symptoms:  Cognitive Cognitive Status: Alert and oriented to person, place, and time, Insightful and able to interpret abstract concepts      Neurological Neurological Review of Symptoms: No symptoms reported    HEENT HEENT Symptoms Reported: No symptoms reported      Cardiovascular Cardiovascular Symptoms Reported: No symptoms reported     Respiratory Respiratory Symptoms Reported: No symptoms reported    Endocrine Endocrine Symptoms Reported: No symptoms reported Is patient diabetic?: No    Gastrointestinal Gastrointestinal Symptoms Reported: No symptoms reported Additional Gastrointestinal Details: Takes Miralax  every night Gastrointestinal Self-Management Outcome: 4 (good)    Genitourinary Genitourinary Symptoms Reported: No symptoms reported Additional Genitourinary Details: Taking Gemtesa  and tamsulosin  is helping with urgency    Integumentary Integumentary Symptoms Reported: No symptoms reported    Musculoskeletal Musculoskelatal Symptoms Reviewed: Back pain, Limited mobility Additional Musculoskeletal Details: Currently goes to boxing class three days a week to help with movement due to Parkinsons.  Dr. Tat/Neurology as ordered. Musculoskeletal Management Strategies: Medication therapy, Routine screening Musculoskeletal Self-Management Outcome: 3 (uncertain) Falls in the past year?: Yes Patient at Risk for Falls Due to: History of fall(s) Fall risk Follow up: Falls prevention discussed  Psychosocial       Quality of Family Relationships: helpful, involved, supportive    01/11/2024    PHQ2-9 Depression Screening   Little interest or pleasure in doing things More than half the days  Feeling down, depressed, or hopeless Not at all  PHQ-2 - Total Score 2  Trouble falling or staying asleep, or sleeping too much    Feeling tired or having little energy    Poor appetite or overeating     Feeling bad about yourself - or that you are a failure or have let yourself or your family down    Trouble concentrating on things, such as reading the newspaper  or watching television    Moving or speaking so slowly that other people could have noticed.  Or the opposite - being so fidgety or restless that you have been moving around a lot more than usual    Thoughts that you would be better off dead, or hurting yourself in some  way    PHQ2-9 Total Score    If you checked off any problems, how difficult have these problems made it for you to do your work, take care of things at home, or get along with other people    Depression Interventions/Treatment      There were no vitals filed for this visit.  Medications Reviewed Today     Reviewed by Lucian Santana LABOR, RN (Registered Nurse) on 01/11/24 at 1322  Med List Status: <None>   Medication Order Taking? Sig Documenting Provider Last Dose Status Informant  acetaminophen  (TYLENOL ) 500 MG tablet [id number] Yes 1 to 2 tablets as needed [provider]  Active Spouse/Significant Other  amantadine  (SYMMETREL ) 100 MG capsule [id number] Yes TAKE 1 CAPSULE BY MOUTH 3 TIMES A DAY Tat, Rebecca S, DO  Active   amLODipine  (NORVASC ) 2.5 MG tablet [id number] Yes Take 1 tablet (2.5 mg total) by mouth daily. Melvin Pao, NP  Active   baclofen  (LIORESAL ) 10 MG tablet [id number] Yes Take 1 tablet (10 mg total) by mouth 3 (three) times daily. Johnson, Megan P, DO  Active   carbidopa -levodopa  (SINEMET  CR) 50-200 MG tablet [id number] Yes TAKE 1 TABLET BY MOUTH AT BEDTIME Tat, Asberry RAMAN, DO  Active   carbidopa -levodopa  (SINEMET  IR) 25-100 MG tablet [id number] Yes TAKE 2 TABLETS BY MOUTH 4 TIMES A DAY :  2 tablets at 6 AM/10 AM/2 PM/6 PM Tat, Asberry RAMAN, DO  Active   cetirizine (ZYRTEC) 10 MG tablet [id number] Yes Take 10 mg by mouth at bedtime. [provider]  Active Spouse/Significant Other  cholecalciferol  (VITAMIN D3) 25 MCG (1000 UNIT) tablet [id number] Yes Take 1,000 Units by mouth daily. [provider]  Active Spouse/Significant Other  cyanocobalamin (VITAMIN B12) 1000 MCG tablet [id number] Yes Take 1,000 mcg by mouth daily. [provider]  Active   diazepam  (VALIUM ) 10 MG tablet [id number]  1 tablet Orally to be taken 30 minutes prior to scheduled procedure; Duration: 1 days  Patient not taking: Reported on 01/11/2024   [provider]  Active    diclofenac  Sodium (VOLTAREN ) 1 % GEL [id number] Yes APPLY TOPICALLY 4 GRAMS FOUR TIMES DAILY Johnson, Megan P, DO  Active Spouse/Significant Other  DULoxetine  (CYMBALTA ) 20 MG capsule [id number] Yes TAKE 1 CAPSULE BY MOUTH EVERY OTHER DAY Johnson, Megan P, DO  Active   folic acid (FOLVITE) 800 MCG tablet [id number] Yes Take 400 mcg by mouth daily. [provider]  Active   hydrALAZINE  (APRESOLINE ) 25 MG tablet [id number] Yes PRN [provider]  Active   HYDROcodone -acetaminophen  (NORCO/VICODIN) 5-325 MG tablet [id number] Yes Take 1 tablet by mouth 2 (two) times daily. [provider]  Active Spouse/Significant Other  Levodopa  (INBRIJA ) 42 MG CAPS [id number] Yes Samples of this drug were given to the patient, quantity 1 box of 32 capsules, Lot Number [id number] Exp: 03/2023  Patient has been educated on dosage and uses of medication. Evonnie Asberry RAMAN, DO  Active Spouse/Significant Other  Levodopa  (INBRIJA ) 42 MG CAPS [id number]  Samples of this drug were given to the patient, quantity 1, Lot Number [id number] Exp 12-24 Tat, Rebecca S, DO  Active Spouse/Significant Other  Levodopa  (INBRIJA ) 42  MG CAPS [id number]  TWO capsules is ONE dosage (never inhale just one capsule).  You can inhale the capsules as needed up to 5 times per day, separated by 2 hour intervals. Evonnie Asberry RAMAN, DO  Active   melatonin 5 MG TABS [id number] Yes Take 5 mg by mouth at bedtime. [provider]  Active Spouse/Significant Other  metoprolol  succinate (TOPROL -XL) 25 MG 24 hr tablet [id number] Yes Take 1 tablet (25 mg total) by mouth at bedtime. Johnson, Megan P, DO  Active   mometasone  (ELOCON ) 0.1 % cream [id number]  Apply twice daily to affected areas on back for dermatitis. Avoid applying to face, groin, and axilla. Hester Alm BROCKS, MD  Active   Multiple Vitamin (MULTIVITAMIN) tablet [id number]  Take 1 tablet by mouth daily at 6 PM. [provider]  Active Spouse/Significant Other   polyethylene glycol powder (GLYCOLAX /MIRALAX ) 17 GM/SCOOP powder [id number] Yes Take 17 g by mouth at bedtime. Jens Durand, MD  Active            Med Note BEVERLEE JUDGE LITTIE Pablo Dec 08, 2022  3:36 PM)    PREVIDENT 5000 BOOSTER PLUS 1.1 % PSTE [id number]  Place 1 application onto teeth 2 (two) times daily. [provider]  Active Spouse/Significant Other  pyridoxine (B-6) 100 MG tablet [id number] Yes Take 100 mg by mouth daily. [provider]  Active   rOPINIRole  (REQUIP ) 2 MG tablet [id number] Yes TAKE 1 TABLET BY MOUTH 3 TIMES A DAY Tat, Rebecca S, DO  Active   simvastatin  (ZOCOR ) 40 MG tablet [id number] Yes Take 1 tablet (40 mg total) by mouth daily. Johnson, Megan P, DO  Active   tamsulosin  (FLOMAX ) 0.4 MG CAPS capsule [id number] Yes Take 2 capsules (0.8 mg total) by mouth daily. Johnson, Megan P, DO  Active   traZODone  (DESYREL ) 50 MG tablet [id number] Yes Take 1 tablet (50 mg total) by mouth at bedtime. Johnson, Megan P, DO  Active   Vibegron  (GEMTESA ) 75 MG TABS [id number] Yes Take 1 tablet (75 mg total) by mouth daily. Vaillancourt, Samantha, PA-C  Active   XARELTO  20 MG TABS tablet [id number] Yes TAKE 1 TABLET BY MOUTH DAILY Cannady, Jolene T, NP  Active             Recommendation:   Continue Current Plan of Care This RNCM will consult with SW regarding lift recommendations and assistance with emergency plan.   Follow Up Plan:   Telephone follow-up in 1 month as requested by wife/caregiver.  Santana Stamp BSN, CCM [place]  VBC[employer] Population Health RN Care Manager Direct Dial: [phone]  Fax: [phone]

## 2024-01-11 NOTE — Patient Instructions (Signed)
 Visit Information  Thank you for taking time to visit with me today. Please don't hesitate to contact me if I can be of assistance to you before our next scheduled appointment.  Our next appointment is by telephone on Tuesday, Octovber 28th at 1:00pm. Please call the care guide team at [phone] if you need to cancel or reschedule your appointment.   Following is a copy of your care plan:   Goals Addressed             This Visit's Progress    VBCI RN Care Plan       Problems:  Knowledge Deficits related to Caregiver resources  Transportation barriers  Goal: Over the next 2 months the Caregiver will demonstrate ongoing self health care management ability to address transportation barrier as evidenced by    learning about different lifts to facilitate transporting wheelchair in personal vehicle Over the next 30 days the caregiver will demonstrate improved self health care management ability related to caregiver barriers as evidenced by citing an emergency plan for caregiving services for patient.   Interventions:   Evaluation of current treatment plan related to Parkinsons,  self-management and patient's adherence to plan as established by provider. Discussed plans with patient for ongoing care management follow up and provided patient with direct contact information for care management team Assessed social determinant of health barriers  Patient Self-Care Activities:  Call provider office for new concerns or questions  Consider PT consult for ideas on how to safely place wheelchair in SUV.   Plan:  Telephone follow up appointment with care management team member scheduled for:  one month as requested from caregiver/spouse, [name].             Please call the [place]  National Suicide Prevention Lifeline: 913-398-4831 or TTY: 8207109756 TTY [phone]) to talk to a trained counselor if you are experiencing a Mental Health or Behavioral Health Crisis or need  someone to talk to.  The patient verbalized understanding of instructions, educational materials, and care plan provided today and DECLINED offer to receive copy of patient instructions, educational materials, and care plan.   Santana Stamp BSN, CCM [place]  VBCI Population Health RN Care Manager Direct Dial: [phone]  Fax: [phone]

## 2024-01-12 ENCOUNTER — Telehealth: Payer: Self-pay | Admitting: Urology

## 2024-01-12 NOTE — Telephone Encounter (Signed)
 Pt would like a prescription sent to [facility] for Gemtesa  75 mg.  Please call wife and let her know when this has been done, pt is almost out.  Kristy [phone]

## 2024-01-13 MED ORDER — GEMTESA 75 MG PO TABS
75.0000 mg | ORAL_TABLET | Freq: Every day | ORAL | 11 refills | Status: DC
Start: 2024-01-13 — End: 2024-01-13

## 2024-01-13 MED ORDER — GEMTESA 75 MG PO TABS: 75.0000 mg | ORAL_TABLET | Freq: Every day | ORAL | 3 refills | Status: DC

## 2024-01-13 NOTE — Addendum Note (Signed)
 Addended byBETHA CORIE PLATER on: 01/13/2024 10:12 AM   Modules accepted: Orders

## 2024-01-13 NOTE — Telephone Encounter (Signed)
 Pt's wife informed of gemtesa  sent to pharmacy. Pt's wife voiced understanding.

## 2024-01-19 NOTE — Progress Notes (Unsigned)
 01/20/24 Name: Thomas [name] MRN: [id number] DOB: 1950/06/09  Thomas [name] is a 73 y.o. year old male who presented for a telephone visit.   They were referred to the pharmacist by their PCP for assistance in managing hypertension.   Subjective:  Care Team: Primary Care Provider: Vicci Duwaine SQUIBB, DO ; Next Scheduled Visit: 11/3  Medication Access/Adherence  Current Pharmacy:  [facility] [id number] - [place], [place] - [address] [place] [address] Phone: [phone] Fax: [phone]  [facility] [address], [place] - [address] [place] [address] Phone: [phone] Fax: [phone]  [facility] - [place], [place] - [address] [place] [address] Phone: [phone] Fax: [phone]  -Patient reports affordability concerns with their medications: No  -Patient reports access/transportation concerns to their pharmacy: No  -Patient reports adherence concerns with their medications:  No    Hypertension: Current medications: amlodipine  2.5mg  daily,metoprolol  xl 25mg  daily -Patient is also prescribed hydralazine  25mg  daily, and this is given if SBP is >150 -Lisinopril  5mg  recently stopped due to hypotension- BP at OV 9/17 was 110/70 -Patient has a validated, automated, upper arm home BP cuff; and just purchased a new one to verify accuracy  -Current blood pressure readings readings: fluctuate throughout the day with recent readings varying between 91/59 to 170/70; HR is typically 40-50 (rarely 60).  Patient/wife endorse proper BP technique and will check readings 2-3 times daily. -Recently changed from taking all BP medications in the evening to taking amlodipine  at 10am and metoprolol  at 10pm -BP is running lower in the mornings prior to taking amlodipine  and is higher in the evenings around the time metoprolol  is taken -Patient denies  hypotensive s/sx including dizziness, lightheadedness.  However, he did have a recent fall around 4am the morning of 7/3 where he felt dizzy and fell backwards.  Wife checked BP after fall, and it ws 186/68. -Patient denies hypertensive symptoms including headache, chest pain, shortness of breath -Current meal patterns: does not monitor salt intake, but if BP is low will have a salty snack- patient endorses rare caffeine intake and drinks approximately 36oz of water daily along with milk and juice -Current physical activity: boxing -Patient is also on several other medications that can affect BP:  levodopa , baclofen , tamsulosin  can decrease blood pressure; ropinorole can both increase or decrease blood pressure (orthostatic hypotension); amantadine  has also been shown to cause orthostatic hypotension in patients.  All of these medications are being given at different times throughout the day, some multiple times daily.  Objective:  Lab Results  Component Value Date   CREATININE 1.01 08/24/2023   BUN 16 08/24/2023   NA 139 08/24/2023   K 4.2 08/24/2023   CL 103 08/24/2023   CO2 20 08/24/2023   Lab Results  Component Value Date   CHOL 131 08/24/2023   HDL 53 08/24/2023   LDLCALC 66 08/24/2023   TRIG 55 08/24/2023   Assessment/Plan:   Hypertension: -Currently uncontrolled -Continue current regimen at this time -Patient has a follow-up with Dr. Gollan (cardiology) 10/13; advised to take current BP readings for him to review -Could consider adding lisinopril  back on at 2.5mg  daily and administering this around supper time (between amlodipine  and metoprolol ) -Continue to monitor and record BP readings 2-3x/d  -Increase water intake to help prevent orthostatic hypotension  Follow Up Plan: 1 month  [name], PharmD,  DPLA

## 2024-01-20 ENCOUNTER — Other Ambulatory Visit: Payer: Self-pay

## 2024-01-20 DIAGNOSIS — I10 Essential (primary) hypertension: Secondary | ICD-10-CM

## 2024-01-21 ENCOUNTER — Other Ambulatory Visit: Payer: Self-pay | Admitting: *Deleted

## 2024-01-22 NOTE — Progress Notes (Signed)
 Patient ID: Thomas [name], male   DOB: [birth year], 73 y.o.   MRN: [id number] Cardiology Office Note  Date:  01/25/2024   ID:  Thomas [name], DOB October 24, 1950, MRN [id number]  PCP:  Vicci Duwaine SQUIBB, DO   Chief Complaint  Patient presents with   1 year follow up     Patient c/o fluctuating in his blood pressure. Denies chest pain or shortness of breath.     HPI:  73 yo male with a history of  DVT, PE on chronic anticoagulation Parkinson's,  paroxysmal atrial fibrillation,  on xarelto ,  07/2015 DJD on CT scan thoracic spine No prior smoking history, no diabetes, cholesterol well controlled on a statin who presents for follow-up of his atrial fibrillation   Last seen in clinic by myself October 2023 On today's visit he presents with his wife Reports he has had recent falls x 3 last year Low pressure running low in the morning, higher in the afternoon and evenings  Currently taking amlodipine  2.5 at 10 AM Takes 2 of his Flomax  capsules 6 AM Takes metoprolol  succinate before bed Hydralazine  as needed for systolic pressure over 150 in the evening Not on lisinopril   Weight trending down, on ensure  Active,Goes to rock steady boxing, 3x a week Gym 3x week  Activity limited by chronic back pain Previously seen by neurosurgery  EKG personally reviewed by myself on todays visit EKG Interpretation Date/Time:  Monday January 25 2024 11:53:18 EDT Ventricular Rate:  58 PR Interval:  188 QRS Duration:  90 QT Interval:  444 QTC Calculation: 435 R Axis:   -18  Text Interpretation: Sinus bradycardia Minimal voltage criteria for LVH, may be normal variant ( R in aVL ) When compared with ECG of 15-Oct-2022 13:41, No significant change was found Confirmed by Perla Lye [phone]) on 01/25/2024 12:12:39 PM   Lab work reviewed with him Lab Results  Component Value Date   CHOL 131 08/24/2023   HDL 53 08/24/2023   LDLCALC 66 08/24/2023   TRIG 55 08/24/2023    Other past medical history  reviewed 06/23/2014 Lower extremity ultrasound revealed persistent partially occlusive thrombus in the left posterior tibial, popliteal, and mid femoral veins.     01/02/2015 Left lower extremity duplex  revealed persistent but improving nonocclusive partial thrombus in the femoral and popliteal veins.  Calf veins were patient.    Chest CT on 06/23/2014 revealed no pulmonary nodules or adenopathy.    He did not do well on amiodarone , felt this made his tremor worse He is followed by neurology in [place], Dr. EVONNIE  Previously seen in the emergency room in the setting of upper respiratory infection, found to be in atrial fibrillation, converted in the emergency room after he was given amiodarone    PMH:   has a past medical history of Activated protein C resistance (06/28/2019), Arthritis of knee, Arthropathy of lumbar facet joint, Atrial fibrillation, BPH (benign prostatic hyperplasia) (09/10/2018), DVT (deep venous thrombosis), Dysplastic nevus (05/15/2021), Erectile dysfunction (03/01/2015), Herniated lumbar disc without myelopathy (10/11/2020), Hyperchloremia, Hypercholesterolemia, Hypertension, Inability to walk (02/22/2021), Insomnia due to medical condition (09/30/2018), Major depressive disorder (01/22/2022), 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) (11/29/2018), Nephrolithiasis, Osteoarthritis, Parkinson's disease (HCC), Plantar wart, Pneumonia, Post-operative nausea and vomiting (09/20/2020), Pulmonary embolism, Sciatica, Seasonal allergies, and TIA (transient ischemic attack).  PSH:    Past Surgical History:  Procedure Laterality Date   APPENDECTOMY     BOWEL DECOMPRESSION  10/15/2022   Procedure: BOWEL DECOMPRESSION;  Surgeon: Onita Elspeth Sharper,  DO;  Location: [facility];  Service: Gastroenterology;;   CATARACT EXTRACTION Bilateral 10/2019 11/2019   COLON RESECTION SIGMOID N/A 10/17/2022   Procedure: COLON RESECTION SIGMOID;  Surgeon: Desiderio Schanz, MD;   Location: [facility] ORS;  Service: General;  Laterality: N/A;   COLONOSCOPY WITH PROPOFOL  N/A 07/27/2019   Procedure: COLONOSCOPY WITH PROPOFOL ;  Surgeon: Unk Corinn Skiff, MD;  Location: [facility];  Service: Endoscopy;  Laterality: N/A;   EYE SURGERY     FLEXIBLE SIGMOIDOSCOPY N/A 10/15/2022   Procedure: FLEXIBLE SIGMOIDOSCOPY;  Surgeon: Onita Elspeth Sharper, DO;  Location[facility];  Service: Gastroenterology;  Laterality: N/A;   KNEE ARTHROSCOPY WITH MENISCAL REPAIR Right 2009   KNEE SURGERY Right    LUMBAR LAMINECTOMY/DECOMPRESSION MICRODISCECTOMY Left 10/11/2020   Procedure: Left Lumbar Three-Four, Lumbar Fou-Five  Microdiscectomy;  Surgeon: Unice Pac, MD;  Location: [facility] OR;  Service: Neurosurgery;  Laterality: Left;   melanoma removal Left 07/16/2021   chest   PAIN PUMP IMPLANTATION     T&A     TONSILLECTOMY      Current Outpatient Medications  Medication Sig Dispense Refill   acetaminophen  (TYLENOL ) 500 MG tablet 1 to 2 tablets as needed     amantadine  (SYMMETREL ) 100 MG capsule TAKE 1 CAPSULE BY MOUTH 3 TIMES A DAY 270 capsule 0   amLODipine  (NORVASC ) 2.5 MG tablet Take 1 tablet (2.5 mg total) by mouth daily.     baclofen  (LIORESAL ) 10 MG tablet Take 1 tablet (10 mg total) by mouth 3 (three) times daily. 90 each 3   carbidopa -levodopa  (SINEMET  CR) 50-200 MG tablet TAKE 1 TABLET BY MOUTH AT BEDTIME 90 tablet 0   carbidopa -levodopa  (SINEMET  IR) 25-100 MG tablet TAKE 2 TABLETS BY MOUTH 4 TIMES A DAY :  2 tablets at 6 AM/10 AM/2 PM/6 PM 720 tablet 0   cetirizine (ZYRTEC) 10 MG tablet Take 10 mg by mouth at bedtime.     cholecalciferol  (VITAMIN D3) 25 MCG (1000 UNIT) tablet Take 1,000 Units by mouth daily.     cyanocobalamin (VITAMIN B12) 1000 MCG tablet Take 1,000 mcg by mouth daily.     diclofenac  Sodium (VOLTAREN ) 1 % GEL APPLY TOPICALLY 4 GRAMS FOUR TIMES DAILY 400 g 1   DULoxetine  (CYMBALTA ) 20 MG capsule TAKE 1 CAPSULE BY MOUTH EVERY OTHER DAY 45 capsule 0   folic  acid (FOLVITE) 800 MCG tablet Take 400 mcg by mouth daily.     hydrALAZINE  (APRESOLINE ) 25 MG tablet PRN     HYDROcodone -acetaminophen  (NORCO/VICODIN) 5-325 MG tablet Take 1 tablet by mouth 2 (two) times daily.     Levodopa  (INBRIJA ) 42 MG CAPS Samples of this drug were given to the patient, quantity 1 box of 32 capsules, Lot Number [id number] Exp: 03/2023  Patient has been educated on dosage and uses of medication. 32 capsule 0   Levodopa  (INBRIJA ) 42 MG CAPS Samples of this drug were given to the patient, quantity 1, Lot Number [id number] Exp 12-24 60 capsule 0   Levodopa  (INBRIJA ) 42 MG CAPS TWO capsules is ONE dosage (never inhale just one capsule).  You can inhale the capsules as needed up to 5 times per day, separated by 2 hour intervals. 300 capsule 2   melatonin 5 MG TABS Take 5 mg by mouth at bedtime.     metoprolol  succinate (TOPROL -XL) 25 MG 24 hr tablet Take 1 tablet (25 mg total) by mouth at bedtime. 90 tablet 1   mometasone  (ELOCON ) 0.1 % cream Apply twice daily to affected areas  on back for dermatitis. Avoid applying to face, groin, and axilla. 45 g 2   Multiple Vitamin (MULTIVITAMIN) tablet Take 1 tablet by mouth daily at 6 PM.     polyethylene glycol powder (GLYCOLAX /MIRALAX ) 17 GM/SCOOP powder Take 17 g by mouth at bedtime.     PREVIDENT 5000 BOOSTER PLUS 1.1 % PSTE Place 1 application onto teeth 2 (two) times daily.     pyridoxine (B-6) 100 MG tablet Take 100 mg by mouth daily.     rOPINIRole  (REQUIP ) 2 MG tablet TAKE 1 TABLET BY MOUTH 3 TIMES A DAY 270 tablet 0   simvastatin  (ZOCOR ) 40 MG tablet Take 1 tablet (40 mg total) by mouth daily. 90 tablet 2   tamsulosin  (FLOMAX ) 0.4 MG CAPS capsule Take 2 capsules (0.8 mg total) by mouth daily. 180 capsule 1   traZODone  (DESYREL ) 50 MG tablet Take 1 tablet (50 mg total) by mouth at bedtime. 90 tablet 1   Vibegron  (GEMTESA ) 75 MG TABS Take 1 tablet (75 mg total) by mouth daily. 90 tablet 3   XARELTO  20 MG TABS tablet TAKE 1 TABLET BY  MOUTH DAILY 90 tablet 0   No current facility-administered medications for this visit.    Allergies:   Codeine and Other   Social History:  The patient  reports that he has never smoked. He has never been exposed to tobacco smoke. He has never used smokeless tobacco. He reports that he does not drink alcohol  and does not use drugs.   Family History:   family history includes Coronary artery disease in his mother; Deep vein thrombosis in his sister; Heart attack in his sister; Parkinson's disease in his father.    Review of Systems: Review of Systems  HENT: Negative.    Respiratory: Negative.    Cardiovascular: Negative.   Gastrointestinal: Negative.   Musculoskeletal:  Positive for back pain.  Neurological: Negative.   Psychiatric/Behavioral: Negative.    All other systems reviewed and are negative.   PHYSICAL EXAM: VS:  BP 100/60 (BP Location: Left Arm, Patient Position: Sitting, Cuff Size: Normal)   Pulse (!) 58   Ht 5' 9.5 (1.765 m)   Wt 213 lb 8 oz (96.8 kg)   SpO2 97%   BMI 31.08 kg/m  , BMI Body mass index is 31.08 kg/m. Constitutional:  oriented to person, place, and time. No distress.  HENT:  Head: Grossly normal Eyes:  no discharge. No scleral icterus.  Neck: No JVD, no carotid bruits  Cardiovascular: Regular rate and rhythm, no murmurs appreciated Pulmonary/Chest: Clear to auscultation bilaterally, no wheezes or rails Abdominal: Soft.  no distension.  no tenderness.  Musculoskeletal: Normal range of motion Neurological:  normal muscle tone. Coordination normal. No atrophy Skin: Skin warm and dry Psychiatric: normal affect, pleasant  Recent Labs: 08/24/2023: ALT 8; BUN 16; Creatinine, Ser 1.01; Hemoglobin 15.1; Platelets 160; Potassium 4.2; Sodium 139; TSH 1.180    Lipid Panel Lab Results  Component Value Date   CHOL 131 08/24/2023   HDL 53 08/24/2023   LDLCALC 66 08/24/2023   TRIG 55 08/24/2023    Wt Readings from Last 3 Encounters:  01/25/24 213  lb 8 oz (96.8 kg)  12/30/23 210 lb 9.6 oz (95.5 kg)  11/30/23 212 lb 12.8 oz (96.5 kg)     ASSESSMENT AND PLAN:  Paroxysmal atrial fibrillation (HCC) -  Maintaining normal sinus rhythm, continue succinate 25 daily Continue Xarelto   PVCs Asymptomatic, continue metoprolol   Hypercholesterolemia On simvastatin  40 daily, numbers at goal  Essential hypertension Low blood pressure in the morning, higher in the afternoons and evenings - Recommend he move metoprolol  to 2 PM (currently taking before bed), Flomax  to evening (currently taking 6 AM), continue amlodipine  10 AM - If blood pressure remains dangerously low in the morning could consider midodrine 5 mg as needed - Hydralazine  25 mg as needed for persistently elevated pressures in the evening greater than 150  Back pain Chronic discomfort Prior back surgery, previously seen by neurosurgery  Deep vein thrombosis (DVT) of left lower extremity, unspecified chronicity, unspecified vein (HCC)  nonocclusive clot left popliteal vein  on chronic anticoagulation xarelto .  No events  Other chronic pulmonary embolism (HCC) On chronic anticoagulation, Xarelto  20 mg, full dose for A-fib prophylaxis  Parkinson's disease (HCC) Recent falls, otherwise continues to do regular exercise    Orders Placed This Encounter  Procedures   EKG 12-Lead    Signed, Velinda Lunger, M.D., Ph.D. 01/25/2024  [facility] [place], [place] [phone]

## 2024-01-22 NOTE — Patient Instructions (Signed)
 Visit Information  Thank you for taking time to visit with me today. Please don't hesitate to contact me if I can be of assistance to you before our next scheduled appointment.  Our next appointment is by telephone on 03/04/24 at 1pm Please call the care guide team at [phone] if you need to cancel or reschedule your appointment.   Following is a copy of your care plan:   Goals Addressed             This Visit's Progress    VBCI Social Work Care Plan       Problems:   Lacks knowledge of how to connect to community resources to assist with patient's ongoing care needs  CSW Clinical Goal(s):   Over the next 90 days the Caregiver will work with Child psychotherapist to address needs related to patient's ongoing care needs  as evidenced by follow up with [facility] for referral  for: respite and the [facility] program.  Interventions:  Respite care program and referral process discussed             Incontinent Supply program information provided-[facility] 01/25/24 9am-2pm             CHORE program discussed-information to be  provided by email as well as private duty options  Information to be provided for possiible options for a wheelchair hydraulic lift for patient's car       Patient Goals/Self-Care Activities:  Follow up with [facility] regarding the respite care and [facility] program             Follow up with [facility] regarding their incontinent supply program Plan:   Telephone follow up appointment with care management team member scheduled for:  03/04/24        Please call the Suicide and Crisis Lifeline: 988 call the [place]  National Suicide Prevention Lifeline: 314-492-4494 or TTY: 641-831-1886 TTY 267-421-9658) to talk to a trained counselor call 1-800-273-TALK (toll free, 24 hour hotline) call 911 if you are experiencing a Mental Health or Behavioral Health Crisis or need someone to talk to.  The patient verbalized understanding of  instructions, educational materials, and care plan provided today and agreed to receive a mailed copy of patient instructions, educational materials, and care plan.   Noel Henandez, LCSW [place]  [facility], [facility] Licensed Clinical Social Worker  Direct Dial: [phone]

## 2024-01-22 NOTE — Patient Outreach (Signed)
 Complex Care Management   Visit Note  01/22/2024  Name:  Thomas [name] MRN: [id number] DOB: 13-Jun-1950  Situation: Referral received for Complex Care Management related to resources and support I obtained verbal consent from Patient.  Visit completed with Caregiver Patient  on the phone  Background:   Past Medical History:  Diagnosis Date   Activated protein C resistance 06/28/2019   Arthritis of knee    Arthropathy of lumbar facet joint    Atrial fibrillation    BPH (benign prostatic hyperplasia) 09/10/2018   DVT (deep venous thrombosis)    L leg   Dysplastic nevus 05/15/2021   Left Superior Pectoral, mod to severe, Excised 07/16/21   Erectile dysfunction 03/01/2015   Herniated lumbar disc without myelopathy 10/11/2020   Hyperchloremia    Hypercholesterolemia    Hypertension    Inability to walk 02/22/2021   Insomnia due to medical condition 09/30/2018   Major depressive disorder 01/22/2022   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   Nephrolithiasis    Osteoarthritis    Parkinson's disease (HCC)    Plantar wart    Pneumonia    Post-operative nausea and vomiting 09/20/2020   Pulmonary embolism    Sciatica    Seasonal allergies    TIA (transient ischemic attack)     Assessment: Patient Reported Symptoms:  Cognitive Cognitive Status: Alert and oriented to person, place, and time, Insightful and able to interpret abstract concepts Cognitive/Intellectual Conditions Management [RPT]: Other Other: Patient has been diagnosed with Parkinson's 14 years ago   Health Maintenance Behaviors: Annual physical exam Healing Pattern: Average Health Facilitated by: Prayer/meditation, Healthy diet  Neurological Neurological Review of Symptoms: No symptoms reported    HEENT HEENT Symptoms Reported: No symptoms reported      Cardiovascular Cardiovascular Symptoms Reported: No symptoms reported Does patient have uncontrolled Hypertension?: Yes Is  patient checking Blood Pressure at home?: Yes Patient's Recent BP reading at home: 139/85 Cardiovascular Management Strategies: Medication therapy Cardiovascular Self-Management Outcome: 3 (uncertain) Cardiovascular Comment: appointment scheduled with Cardiologist on 01/25/24-Dr. Letha  Respiratory Respiratory Symptoms Reported: No symptoms reported    Endocrine Endocrine Symptoms Reported: No symptoms reported    Gastrointestinal Gastrointestinal Symptoms Reported: No symptoms reported      Genitourinary Genitourinary Symptoms Reported: Urgency Additional Genitourinary Details: Gemtesa  and tamsulosin  is helping with urgency    Integumentary Integumentary Symptoms Reported: No symptoms reported Additional Integumentary Details: Bruises easy-on xarelto     Musculoskeletal Musculoskelatal Symptoms Reviewed: Back pain, Limited mobility Additional Musculoskeletal Details: boxing classes 3 days per week to help with Parkinsons-drives himself-due to back pain has to find places to sit and takes breaks regularly -has a transport chair but not a wheelchair-wants resources for a hydraulic lift for her car-spouse active in parkinsons support group-1x per month Musculoskeletal Management Strategies: Medication therapy, Routine screening      Psychosocial Psychosocial Symptoms Reported: No symptoms reported Additional Psychological Details: takes trazadone at night and cymbalta           01/22/2024    PHQ2-9 Depression Screening   Little interest or pleasure in doing things Not at all  Feeling down, depressed, or hopeless Not at all  PHQ-2 - Total Score 0  Trouble falling or staying asleep, or sleeping too much    Feeling tired or having little energy    Poor appetite or overeating     Feeling bad about yourself - or that you are a failure or have let yourself or your family down  Trouble concentrating on things, such as reading the newspaper or watching television    Moving or speaking  so slowly that other people could have noticed.  Or the opposite - being so fidgety or restless that you have been moving around a lot more than usual    Thoughts that you would be better off dead, or hurting yourself in some way    PHQ2-9 Total Score    If you checked off any problems, how difficult have these problems made it for you to do your work, take care of things at home, or get along with other people    Depression Interventions/Treatment      There were no vitals filed for this visit.  Medications Reviewed Today     Reviewed by Ermalinda Lenn HERO, LCSW (Social Worker) on 01/21/24 at 1402  Med List Status: <None>   Medication Order Taking? Sig Documenting Provider Last Dose Status Informant  acetaminophen  (TYLENOL ) 500 MG tablet [id number] Yes 1 to 2 tablets as needed [provider]  Active Spouse/Significant Other  amantadine  (SYMMETREL ) 100 MG capsule [id number] Yes TAKE 1 CAPSULE BY MOUTH 3 TIMES A DAY Tat, Rebecca S, DO  Active   amLODipine  (NORVASC ) 2.5 MG tablet [id number] Yes Take 1 tablet (2.5 mg total) by mouth daily. Melvin Pao, NP  Active   baclofen  (LIORESAL ) 10 MG tablet [id number] Yes Take 1 tablet (10 mg total) by mouth 3 (three) times daily. Vicci, Megan P, DO  Active   carbidopa -levodopa  (SINEMET  CR) 50-200 MG tablet [id number] Yes TAKE 1 TABLET BY MOUTH AT BEDTIME Tat, Asberry RAMAN, DO  Active   carbidopa -levodopa  (SINEMET  IR) 25-100 MG tablet [id number] Yes TAKE 2 TABLETS BY MOUTH 4 TIMES A DAY :  2 tablets at 6 AM/10 AM/2 PM/6 PM Tat, Asberry RAMAN, DO  Active   cetirizine (ZYRTEC) 10 MG tablet [id number] Yes Take 10 mg by mouth at bedtime. [provider]  Active Spouse/Significant Other  cholecalciferol  (VITAMIN D3) 25 MCG (1000 UNIT) tablet [id number] Yes Take 1,000 Units by mouth daily. [provider]  Active Spouse/Significant Other  cyanocobalamin (VITAMIN B12) 1000 MCG tablet [id number] Yes Take 1,000 mcg by mouth daily. [provider]  Active   diclofenac  Sodium (VOLTAREN ) 1 % GEL [id number] Yes APPLY TOPICALLY 4 GRAMS FOUR TIMES DAILY Johnson, Megan P, DO  Active Spouse/Significant Other  DULoxetine  (CYMBALTA ) 20 MG capsule [id number] Yes TAKE 1 CAPSULE BY MOUTH EVERY OTHER DAY Johnson, Megan P, DO  Active   folic acid (FOLVITE) 800 MCG tablet [id number] Yes Take 400 mcg by mouth daily. [provider]  Active   hydrALAZINE  (APRESOLINE ) 25 MG tablet [id number] Yes PRN  Patient taking differently: Take one tablet if SBP >150   [provider]  Active   HYDROcodone -acetaminophen  (NORCO/VICODIN) 5-325 MG tablet [id number] Yes Take 1 tablet by mouth 2 (two) times daily. [provider]  Active Spouse/Significant Other  Levodopa  (INBRIJA ) 42 MG CAPS [id number] Yes Samples of this drug were given to the patient, quantity 1 box of 32 capsules, Lot Number [id number] Exp: 03/2023  Patient has been educated on dosage and uses of medication. Evonnie Asberry RAMAN, DO  Active Spouse/Significant Other  Levodopa  (INBRIJA ) 42 MG CAPS [id number] Yes Samples of this drug were given to the patient, quantity 1, Lot Number [id number] Exp 12-24 Tat, Rebecca S, DO  Active Spouse/Significant Other  Levodopa  (INBRIJA ) 42 MG CAPS [id number] Yes TWO capsules is ONE dosage (never inhale just one capsule).  You can  inhale the capsules as needed up to 5 times per day, separated by 2 hour intervals. Evonnie Asberry RAMAN, DO  Active   melatonin 5 MG TABS [id number] Yes Take 5 mg by mouth at bedtime. [provider]  Active Spouse/Significant Other  metoprolol  succinate (TOPROL -XL) 25 MG 24 hr tablet [id number] Yes Take 1 tablet (25 mg total) by mouth at bedtime. Johnson, Megan P, DO  Active   mometasone  (ELOCON ) 0.1 % cream [id number] Yes Apply twice daily to affected areas on back for dermatitis. Avoid applying to face, groin, and axilla. Hester Alm BROCKS, MD  Active   Multiple Vitamin (MULTIVITAMIN) tablet [id number] Yes Take 1  tablet by mouth daily at 6 PM. [provider]  Active Spouse/Significant Other  polyethylene glycol powder (GLYCOLAX /MIRALAX ) 17 GM/SCOOP powder [id number] Yes Take 17 g by mouth at bedtime. Jens Durand, MD  Active            Med Note BEVERLEE JUDGE LITTIE Pablo Dec 08, 2022  3:36 PM)    PREVIDENT 5000 BOOSTER PLUS 1.1 % PSTE [id number] Yes Place 1 application onto teeth 2 (two) times daily. [provider]  Active Spouse/Significant Other  pyridoxine (B-6) 100 MG tablet [id number] Yes Take 100 mg by mouth daily. [provider]  Active   rOPINIRole  (REQUIP ) 2 MG tablet [id number] Yes TAKE 1 TABLET BY MOUTH 3 TIMES A DAY Tat, Rebecca S, DO  Active   simvastatin  (ZOCOR ) 40 MG tablet [id number] Yes Take 1 tablet (40 mg total) by mouth daily. Johnson, Megan P, DO  Active   tamsulosin  (FLOMAX ) 0.4 MG CAPS capsule [id number] Yes Take 2 capsules (0.8 mg total) by mouth daily. Johnson, Megan P, DO  Active   traZODone  (DESYREL ) 50 MG tablet [id number] Yes Take 1 tablet (50 mg total) by mouth at bedtime. Johnson, Megan P, DO  Active   Vibegron  (GEMTESA ) 75 MG TABS [id number]  Take 1 tablet (75 mg total) by mouth daily. Gaston Hamilton, MD  Active   XARELTO  20 MG TABS tablet [id number] Yes TAKE 1 TABLET BY MOUTH DAILY Cannady, Jolene T, NP  Active             Recommendation:   PCP Follow-up Specialty provider follow-up as scheduled Review resources provided for hydraulic lift once received [facility]-incontinent supply 01/25/24 9am-2pm [facility]  Follow Up Plan:   Telephone follow up appointment date/time:  03/04/24   Lenn Mean, LCSW [employer]  [facility], [facility] Licensed Clinical Social Worker  Direct Dial: [phone]

## 2024-01-23 ENCOUNTER — Other Ambulatory Visit: Payer: Self-pay | Admitting: Nurse Practitioner

## 2024-01-25 ENCOUNTER — Ambulatory Visit: Attending: Cardiovascular Disease | Admitting: Cardiovascular Disease

## 2024-01-25 ENCOUNTER — Encounter: Payer: Self-pay | Admitting: Cardiovascular Disease

## 2024-01-25 VITALS — BP 100/60 | HR 58 | Ht 69.5 in | Wt 213.5 lb

## 2024-01-25 DIAGNOSIS — E7800 Pure hypercholesterolemia, unspecified: Secondary | ICD-10-CM | POA: Diagnosis not present

## 2024-01-25 DIAGNOSIS — G20A1 Parkinson's disease without dyskinesia, without mention of fluctuations: Secondary | ICD-10-CM

## 2024-01-25 DIAGNOSIS — I480 Paroxysmal atrial fibrillation: Secondary | ICD-10-CM | POA: Diagnosis not present

## 2024-01-25 DIAGNOSIS — I10 Essential (primary) hypertension: Secondary | ICD-10-CM

## 2024-01-25 DIAGNOSIS — I82502 Chronic embolism and thrombosis of unspecified deep veins of left lower extremity: Secondary | ICD-10-CM | POA: Diagnosis not present

## 2024-01-25 NOTE — Patient Instructions (Addendum)
 Medication Instructions:   Move the tamsulosin  to 6 pm Metoprolol  at 2 pm Amlodipine  10 Am Hydralazine  as needed for pressure >150  Call if morning pressure continues to run low, we could use midodrine which pushes blood pressure upwards  If you need a refill on your cardiac medications before your next appointment, please call your pharmacy.   Lab work: No new labs needed  Testing/Procedures: No new testing needed  Follow-Up: At [facility], you and your health needs are our priority.  As part of our continuing mission to provide you with exceptional heart care, we have created designated Provider Care Teams.  These Care Teams include your primary Cardiologist (physician) and Advanced Practice Providers (APPs -  Physician Assistants and Nurse Practitioners) who all work together to provide you with the care you need, when you need it.  You will need a follow up appointment in 6 months  Providers on your designated Care Team:   Lonni Meager, NP Bernardino Bring, PA-C [place], [place]  COVID-19 Vaccine Information can be found at: [email or web address] For questions related to vaccine distribution or appointments, please email [email or web address] or call [phone].

## 2024-01-26 NOTE — Telephone Encounter (Signed)
 Discontinued 11/30/23.  Requested Prescriptions  Pending Prescriptions Disp Refills   lisinopril  (ZESTRIL ) 10 MG tablet [Pharmacy Med Name: LISINOPRIL  10 MG TABLET] 90 tablet 0    Sig: TAKE 1 TABLET BY MOUTH DAILY     Cardiovascular:  ACE Inhibitors Passed - 01/26/2024 11:26 AM      Passed - Cr in normal range and within 180 days    Creatinine, Ser  Date Value Ref Range Status  08/24/2023 1.01 0.76 - 1.27 mg/dL Final         Passed - K in normal range and within 180 days    Potassium  Date Value Ref Range Status  08/24/2023 4.2 3.5 - 5.2 mmol/L Final         Passed - Patient is not pregnant      Passed - Last BP in normal range    BP Readings from Last 1 Encounters:  01/25/24 100/60         Passed - Valid encounter within last 6 months    Recent Outpatient Visits           3 weeks ago Primary hypertension   [place] [facility] [place], [place], DO   1 month ago Weight loss   [place] [facility] [place], Megan P, DO   2 months ago Primary hypertension   [place] [facility] Melvin Pao, NP   5 months ago Encounter for annual wellness visit (AWV) in Medicare patient   [place] [facility], Duwaine SQUIBB, DO       Future Appointments             In 1 month MacDiarmid, [place], MD [facility] [place]   In 8 months Hester Alm BROCKS, MD [facility]

## 2024-02-03 DIAGNOSIS — Z79891 Long term (current) use of opiate analgesic: Secondary | ICD-10-CM | POA: Diagnosis not present

## 2024-02-03 DIAGNOSIS — G20A1 Parkinson's disease without dyskinesia, without mention of fluctuations: Secondary | ICD-10-CM | POA: Diagnosis not present

## 2024-02-03 DIAGNOSIS — G8929 Other chronic pain: Secondary | ICD-10-CM | POA: Diagnosis not present

## 2024-02-03 DIAGNOSIS — M47816 Spondylosis without myelopathy or radiculopathy, lumbar region: Secondary | ICD-10-CM | POA: Diagnosis not present

## 2024-02-03 DIAGNOSIS — M545 Low back pain, unspecified: Secondary | ICD-10-CM | POA: Diagnosis not present

## 2024-02-03 DIAGNOSIS — M533 Sacrococcygeal disorders, not elsewhere classified: Secondary | ICD-10-CM | POA: Diagnosis not present

## 2024-02-09 ENCOUNTER — Other Ambulatory Visit: Payer: Self-pay

## 2024-02-10 NOTE — Patient Instructions (Signed)
 Visit Information  Thank you for taking time to visit with me today. Please don't hesitate to contact me if I can be of assistance to you before our next scheduled appointment.  Your next care management appointment is by telephone on  Tuesday, November 11th  at 10:00am   Please call the care guide team at [phone] if you need to cancel, schedule, or reschedule an appointment.   Please call the [place]  National Suicide Prevention Lifeline: 9258624972 or TTY: (402) 332-0888 TTY [phone]) to talk to a trained counselor if you are experiencing a Mental Health or Behavioral Health Crisis or need someone to talk to.  Santana Stamp BSN, CCM [place]  VBC[employer] Population Health RN Care Manager Direct Dial: [phone]  Fax: [phone]

## 2024-02-10 NOTE — Patient Outreach (Signed)
 Complex Care Management   Visit Note  02/10/2024  Name:  Thomas [name] MRN: [id number] DOB: Jul 17, 1950  Situation: Referral received for Complex Care Management related to Parkinsons. I obtained verbal consent from Caregiver Patient.  Visit completed with patient and [name] on speaker  on the phone.  Patient/wife is working with LCSW to find wheelchair lift resources.  He is currently experiencing pain from warts on the bottoms of both feet.   Background:   Past Medical History:  Diagnosis Date   Activated protein C resistance 06/28/2019   Arthritis of knee    Arthropathy of lumbar facet joint    Atrial fibrillation    BPH (benign prostatic hyperplasia) 09/10/2018   DVT (deep venous thrombosis)    L leg   Dysplastic nevus 05/15/2021   Left Superior Pectoral, mod to severe, Excised 07/16/21   Erectile dysfunction 03/01/2015   Herniated lumbar disc without myelopathy 10/11/2020   Hyperchloremia    Hypercholesterolemia    Hypertension    Inability to walk 02/22/2021   Insomnia due to medical condition 09/30/2018   Major depressive disorder 01/22/2022   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      Neurological Neurological Review of Symptoms: No symptoms reported    HEENT HEENT Symptoms Reported: Not assessed      Cardiovascular Cardiovascular Symptoms Reported: No symptoms reported    Respiratory Respiratory Symptoms  Reported: No symptoms reported    Endocrine Endocrine Symptoms Reported: No symptoms reported Is patient diabetic?: No    Gastrointestinal Gastrointestinal Symptoms Reported: No symptoms reported      Genitourinary Genitourinary Symptoms Reported: Urgency    Integumentary Integumentary Symptoms Reported: Other Other Integumentary Symptoms: Has multiple warts on the bottom of both feet, history of having them removed but it was very painful and he doesn't want to go through it again.  This RNCM recommended he speak to PCP on 02/15/24 OV, allow PCP to assess and make best recommendation for next steps.  Mrs. Brunkow will call [employer] to inquire if his insurance plan covers sole inserts. Skin Self-Management Outcome: 3 (uncertain)  Musculoskeletal Musculoskelatal Symptoms Reviewed: Back pain, Limited mobility        Psychosocial Psychosocial Symptoms Reported: Not assessed          02/10/2024    PHQ2-9 Depression Screening   Little interest or pleasure in doing things    Feeling down, depressed, or hopeless    PHQ-2 - Total Score    Trouble falling or staying asleep, or sleeping too much    Feeling tired or having little energy    Poor appetite or overeating     Feeling bad about yourself - or that you are a failure or have let yourself or your family down    Trouble concentrating on things, such as reading the newspaper or watching television    Moving or speaking so slowly that other people could have noticed.  Or the opposite - being so  fidgety or restless that you have been moving around a lot more than usual    Thoughts that you would be better off dead, or hurting yourself in some way    PHQ2-9 Total Score    If you checked off any problems, how difficult have these problems made it for you to do your work, take care of things at home, or get along with other people    Depression Interventions/Treatment      There were no vitals filed for this visit.  Medications Reviewed  Today   Medications were not reviewed in this encounter     Recommendation:   Discussed upcoming appts:  Mr. Rosencrans will discuss options for treating warts on the bottoms of his feet at PCP visit on 02/15/24.    Follow Up Plan:   Telephone follow-up 02/23/24 at 10:00am.  Santana Stamp BSN, CCM [place]  [facility] RN Care Manager Direct Dial: [phone]  Fax: [phone]

## 2024-02-12 ENCOUNTER — Encounter: Payer: Self-pay | Admitting: *Deleted

## 2024-02-12 ENCOUNTER — Telehealth: Payer: Self-pay | Admitting: *Deleted

## 2024-02-12 NOTE — Patient Outreach (Signed)
 Return call from patient's spouse. Provided information for [employer] [phone]. Encouraged patient's spouse to call to inquire about wheelchair lift options.  Patient's spouse also provided information obtained from VBCI manager: Parkinson's patients in the [place] area who need help paying for a fleeta lift or other accessibility modifications,  The [employer] [phone]) may be able to assist for local funding referrals, and Parkinson Association of the [place]. [phone]) for support and community programs.  The may offer resource referrals for mobility, therapy, and financial assistance (like vehicle modifications or home adaptations). Patient's spouse agreeable to contacting resources provided. It was also confirmed that referral to the [facility] program has been complete. Patient now on waiting list for in home care support.  Follow up phone call scheduled for 03/04/24.   Dayvion Sans, LCSW [place]  [facility], [facility] Licensed Clinical Social Worker  Direct Dial: [phone]

## 2024-02-12 NOTE — Patient Outreach (Signed)
 Phone call to patient and spouse to follow up on resources provided related to wheelchair lift options and to provide status or referral for in home care support through the [facility] program. VM left for a return call.   Ly Bacchi, LCSW [place]  [facility], [facility] Licensed Clinical Social Worker  Direct Dial: [phone]

## 2024-02-15 ENCOUNTER — Ambulatory Visit (INDEPENDENT_AMBULATORY_CARE_PROVIDER_SITE_OTHER): Admitting: Family Medicine

## 2024-02-15 ENCOUNTER — Encounter: Payer: Self-pay | Admitting: Family Medicine

## 2024-02-15 VITALS — BP 112/70 | HR 75 | Temp 97.7°F | Ht 69.5 in

## 2024-02-15 DIAGNOSIS — E7800 Pure hypercholesterolemia, unspecified: Secondary | ICD-10-CM | POA: Diagnosis not present

## 2024-02-15 DIAGNOSIS — I10 Essential (primary) hypertension: Secondary | ICD-10-CM | POA: Diagnosis not present

## 2024-02-15 DIAGNOSIS — S41101A Unspecified open wound of right upper arm, initial encounter: Secondary | ICD-10-CM

## 2024-02-15 DIAGNOSIS — Z23 Encounter for immunization: Secondary | ICD-10-CM | POA: Diagnosis not present

## 2024-02-15 DIAGNOSIS — G20A1 Parkinson's disease without dyskinesia, without mention of fluctuations: Secondary | ICD-10-CM | POA: Diagnosis not present

## 2024-02-15 DIAGNOSIS — B070 Plantar wart: Secondary | ICD-10-CM

## 2024-02-15 DIAGNOSIS — R262 Difficulty in walking, not elsewhere classified: Secondary | ICD-10-CM | POA: Diagnosis not present

## 2024-02-15 DIAGNOSIS — R350 Frequency of micturition: Secondary | ICD-10-CM

## 2024-02-15 DIAGNOSIS — F3341 Major depressive disorder, recurrent, in partial remission: Secondary | ICD-10-CM

## 2024-02-15 DIAGNOSIS — N401 Enlarged prostate with lower urinary tract symptoms: Secondary | ICD-10-CM

## 2024-02-15 MED ORDER — TAMSULOSIN HCL 0.4 MG PO CAPS: 0.4000 mg | ORAL_CAPSULE | Freq: Two times a day (BID) | ORAL | 1 refills | Status: AC

## 2024-02-15 MED ORDER — SIMVASTATIN 40 MG PO TABS: 40.0000 mg | ORAL_TABLET | Freq: Every day | ORAL | 2 refills | Status: AC

## 2024-02-15 MED ORDER — BACLOFEN 10 MG PO TABS: 10.0000 mg | ORAL_TABLET | Freq: Three times a day (TID) | ORAL | 3 refills | Status: AC

## 2024-02-15 MED ORDER — AMLODIPINE BESYLATE 2.5 MG PO TABS: 2.5000 mg | ORAL_TABLET | Freq: Every day | ORAL | 1 refills | Status: AC

## 2024-02-15 MED ORDER — TRAZODONE HCL 50 MG PO TABS
50.0000 mg | ORAL_TABLET | Freq: Every day | ORAL | 1 refills | Status: AC
Start: 2024-02-15 — End: ?

## 2024-02-15 MED ORDER — RIVAROXABAN 20 MG PO TABS: 20.0000 mg | ORAL_TABLET | Freq: Every day | ORAL | 2 refills | Status: AC

## 2024-02-15 MED ORDER — DULOXETINE HCL 20 MG PO CPEP: 20.0000 mg | ORAL_CAPSULE | Freq: Every day | ORAL | 1 refills | Status: AC

## 2024-02-15 MED ORDER — METOPROLOL SUCCINATE ER 25 MG PO TB24
25.0000 mg | ORAL_TABLET | Freq: Every day | ORAL | 1 refills | Status: AC
Start: 2024-02-15 — End: ?

## 2024-02-15 NOTE — Progress Notes (Signed)
 BP 112/70   Pulse 75   Temp 97.7 F (36.5 C) (Oral)   Ht 5' 9.5 (1.765 m)   SpO2 97%   BMI 31.08 kg/m    Subjective:    Patient ID: Thomas [name], male    DOB: [birth year], 73 y.o.   MRN: [id number]  HPI: Thomas [name] is a 73 y.o. male  Chief Complaint  Patient presents with   Hypertension   PLANTER WARTS   HYPERTENSION / HYPERLIPIDEMIA Satisfied with current treatment? no Duration of hypertension: chronic BP monitoring frequency: a few times a day BP range: 80s/60s- 160s/80s BP medication side effects: no Duration of hyperlipidemia: chronic Cholesterol medication side effects: no Cholesterol supplements: none Past cholesterol medications: simvastatin  Medication compliance: excellent compliance Aspirin: no Recent stressors: yes Recurrent headaches: no Visual changes: no Palpitations: no Dyspnea: no Chest pain: no Lower extremity edema: no Dizzy/lightheaded: yes  BPH BPH status: controlled Satisfied with current treatment?: yes Medication side effects: yes- hypotension Medication compliance: excellent compliance Duration: chronic Nocturia: 1-2x per night Urinary frequency:no Incomplete voiding: no Urgency: no Weak urinary stream: no Straining to start stream: no Dysuria: no Onset: gradual Severity: mild  DEPRESSION Mood status: better Satisfied with current treatment?: no Symptom severity: moderate  Duration of current treatment : a few months Side effects: no Medication compliance: good compliance Psychotherapy/counseling: no  Previous psychiatric medications: cymbalta  Depressed mood: yes Anxious mood: no Anhedonia: no Significant weight loss or gain: no Insomnia: no  Fatigue: yes Feelings of worthlessness or guilt: yes Impaired concentration/indecisiveness: no Suicidal ideations: no Hopelessness: no Crying spells: no    01/21/2024    2:24 PM 01/11/2024    2:27 PM 12/30/2023    9:38 AM 10/29/2023    2:33 PM 08/24/2023   10:51 AM   Depression screen PHQ 2/9  Decreased Interest 0 2 2 0 2  Down, Depressed, Hopeless 0 0 0 0 2  PHQ - 2 Score 0 2 2 0 4  Altered sleeping   1 0 2  Tired, decreased energy   1 0 0  Change in appetite   2 0 0  Feeling bad or failure about yourself    0 0 1  Trouble concentrating   3 0 3  Moving slowly or fidgety/restless   3 0 3  Suicidal thoughts   0 0 0  PHQ-9 Score   12 0 13  Difficult doing work/chores   Somewhat difficult Not difficult at all Somewhat difficult   Has been having a lot of problems with plantar warts. He doesn't want to go through the process of removal, but he would like insoles to help take the pressure off them.   Has been having more weakness with moving around at home. Would like a wheelchair for bad days.   Relevant past medical, surgical, family and social history reviewed and updated as indicated. Interim medical history since our last visit reviewed. Allergies and medications reviewed and updated.  Review of Systems  Constitutional: Negative.   Respiratory: Negative.    Cardiovascular: Negative.   Gastrointestinal: Negative.   Musculoskeletal: Negative.   Neurological:  Positive for dizziness, weakness and light-headedness. Negative for tremors, seizures, syncope, facial asymmetry, speech difficulty, numbness and headaches.  Psychiatric/Behavioral: Negative.      Per HPI unless specifically indicated above     Objective:    BP 112/70   Pulse 75   Temp 97.7 F (36.5 C) (Oral)   Ht 5' 9.5 (1.765 m)   SpO2 97%  BMI 31.08 kg/m   Wt Readings from Last 3 Encounters:  01/25/24 213 lb 8 oz (96.8 kg)  12/30/23 210 lb 9.6 oz (95.5 kg)  11/30/23 212 lb 12.8 oz (96.5 kg)    Physical Exam Vitals and nursing note reviewed.  Constitutional:      General: He is not in acute distress.    Appearance: Normal appearance. He is not ill-appearing, toxic-appearing or diaphoretic.  HENT:     Head: Normocephalic and atraumatic.     Right Ear: External ear  normal.     Left Ear: External ear normal.     Nose: Nose normal.     Mouth/Throat:     Mouth: Mucous membranes are moist.     Pharynx: Oropharynx is clear.  Eyes:     General: No scleral icterus.       Right eye: No discharge.        Left eye: No discharge.     Extraocular Movements: Extraocular movements intact.     Conjunctiva/sclera: Conjunctivae normal.     Pupils: Pupils are equal, round, and reactive to light.  Cardiovascular:     Rate and Rhythm: Normal rate and regular rhythm.     Pulses: Normal pulses.     Heart sounds: Normal heart sounds. No murmur heard.    No friction rub. No gallop.  Pulmonary:     Effort: Pulmonary effort is normal. No respiratory distress.     Breath sounds: Normal breath sounds. No stridor. No wheezing, rhonchi or rales.  Chest:     Chest wall: No tenderness.  Musculoskeletal:        General: Normal range of motion.     Cervical back: Normal range of motion and neck supple.  Skin:    General: Skin is warm and dry.     Capillary Refill: Capillary refill takes less than 2 seconds.     Coloration: Skin is not jaundiced or pale.     Findings: No bruising, erythema, lesion or rash.  Neurological:     General: No focal deficit present.     Mental Status: He is alert and oriented to person, place, and time. Mental status is at baseline.  Psychiatric:        Mood and Affect: Mood normal.        Behavior: Behavior normal.        Thought Content: Thought content normal.        Judgment: Judgment normal.     Results for orders placed or performed in visit on 08/25/23  HM COLONOSCOPY   Collection Time: 10/15/22 12:00 AM  Result Value Ref Range   HM Colonoscopy See Report (in chart) See Report (in chart), Patient Reported      Assessment & Plan:   Problem List Items Addressed This Visit       Cardiovascular and Mediastinum   Hypertension   Still very labile. Will continue current regimen. Will try splitting up his flomax . Call with any  concerns.       Relevant Medications   amLODipine  (NORVASC ) 2.5 MG tablet   metoprolol  succinate (TOPROL -XL) 25 MG 24 hr tablet   simvastatin  (ZOCOR ) 40 MG tablet   rivaroxaban  (XARELTO ) 20 MG TABS tablet   Other Relevant Orders   CBC with Differential/Platelet     Nervous and Auditory   Parkinson's disease (HCC)   Will get wheelchair for him for bad days. Rx written today.         Genitourinary   BPH (  benign prostatic hyperplasia)   Under good control on current regimen. Continue current regimen. Continue to monitor. Call with any concerns. Refills given. Labs drawn today.        Relevant Medications   tamsulosin  (FLOMAX ) 0.4 MG CAPS capsule   Other Relevant Orders   CBC with Differential/Platelet   PSA     Other   Hypercholesterolemia - Primary   Under good control on current regimen. Continue current regimen. Continue to monitor. Call with any concerns. Refills given. Labs drawn today.        Relevant Medications   amLODipine  (NORVASC ) 2.5 MG tablet   metoprolol  succinate (TOPROL -XL) 25 MG 24 hr tablet   simvastatin  (ZOCOR ) 40 MG tablet   rivaroxaban  (XARELTO ) 20 MG TABS tablet   Other Relevant Orders   CBC with Differential/Platelet   Comprehensive metabolic panel with GFR   Lipid Panel w/o Chol/HDL Ratio   Inability to walk   Will get wheelchair for him for bad days. Rx written today.       Major depressive disorder   Doing better. Will increase his cymbalta  to daily. Recheck 3 months. Call with any concerns.       Relevant Medications   DULoxetine  (CYMBALTA ) 20 MG capsule   traZODone  (DESYREL ) 50 MG tablet   Other Visit Diagnoses       Plantar warts       Referral to podiatry placed today. Await their input.   Relevant Orders   Ambulatory referral to Podiatry     Open wound of right upper arm, initial encounter       Healing well. Due for Td- given today.   Relevant Orders   Td : Tetanus/diphtheria >7yo Preservative  free (Completed)         Follow up plan: Return in about 3 months (around 05/17/2024).

## 2024-02-15 NOTE — Assessment & Plan Note (Signed)
 Still very labile. Will continue current regimen. Will try splitting up his flomax . Call with any concerns.

## 2024-02-15 NOTE — Assessment & Plan Note (Signed)
 Will get wheelchair for him for bad days. Rx written today.

## 2024-02-15 NOTE — Assessment & Plan Note (Signed)
 Doing better. Will increase his cymbalta  to daily. Recheck 3 months. Call with any concerns.

## 2024-02-15 NOTE — Assessment & Plan Note (Signed)
 Under good control on current regimen. Continue current regimen. Continue to monitor. Call with any concerns. Refills given. Labs drawn today.

## 2024-02-16 LAB — COMPREHENSIVE METABOLIC PANEL WITH GFR
ALT: 12 IU/L (ref 0–44)
AST: 17 IU/L (ref 0–40)
Albumin: 4.3 g/dL (ref 3.8–4.8)
Alkaline Phosphatase: 96 IU/L (ref 47–123)
BUN/Creatinine Ratio: 22 (ref 10–24)
BUN: 27 mg/dL (ref 8–27)
Bilirubin Total: 0.6 mg/dL (ref 0.0–1.2)
CO2: 22 mmol/L (ref 20–29)
Calcium: 9.5 mg/dL (ref 8.6–10.2)
Chloride: 107 mmol/L — ABNORMAL HIGH (ref 96–106)
Creatinine, Ser: 1.25 mg/dL (ref 0.76–1.27)
Globulin, Total: 2.4 g/dL (ref 1.5–4.5)
Glucose: 115 mg/dL — ABNORMAL HIGH (ref 70–99)
Potassium: 4.2 mmol/L (ref 3.5–5.2)
Sodium: 142 mmol/L (ref 134–144)
Total Protein: 6.7 g/dL (ref 6.0–8.5)
eGFR: 60 mL/min/1.73 (ref >59)

## 2024-02-16 LAB — CBC WITH DIFFERENTIAL/PLATELET
Basophils Absolute: 0 x10E3/uL (ref 0.0–0.2)
Basos: 1 %
EOS (ABSOLUTE): 0.1 x10E3/uL (ref 0.0–0.4)
Eos: 1 %
Hematocrit: 41.8 % (ref 37.5–51.0)
Hemoglobin: 13.4 g/dL (ref 13.0–17.7)
Immature Grans (Abs): 0 x10E3/uL (ref 0.0–0.1)
Immature Granulocytes: 0 %
Lymphocytes Absolute: 0.9 x10E3/uL (ref 0.7–3.1)
Lymphs: 15 %
MCH: 30.6 pg (ref 26.6–33.0)
MCHC: 32.1 g/dL (ref 31.5–35.7)
MCV: 95 fL (ref 79–97)
Monocytes Absolute: 0.5 x10E3/uL (ref 0.1–0.9)
Monocytes: 8 %
Neutrophils Absolute: 4.5 x10E3/uL (ref 1.4–7.0)
Neutrophils: 75 %
Platelets: 164 x10E3/uL (ref 150–450)
RBC: 4.38 x10E6/uL (ref 4.14–5.80)
RDW: 12 % (ref 11.6–15.4)
WBC: 6 x10E3/uL (ref 3.4–10.8)

## 2024-02-16 LAB — LIPID PANEL W/O CHOL/HDL RATIO
Cholesterol, Total: 135 mg/dL (ref 100–199)
HDL: 63 mg/dL (ref >39)
LDL Chol Calc (NIH): 61 mg/dL (ref 0–99)
Triglycerides: 48 mg/dL (ref 0–149)
VLDL Cholesterol Cal: 11 mg/dL (ref 5–40)

## 2024-02-16 LAB — PSA: Prostate Specific Ag, Serum: 2.4 ng/mL (ref 0.0–4.0)

## 2024-02-16 NOTE — Progress Notes (Unsigned)
   02/17/2024 Name: Thomas [name] MRN: [id number] DOB: Jun 02, 1950  Thomas [name] is a 73 y.o. year old male who presented for a telephone visit.   They were referred to the pharmacist by their PCP for assistance in managing hypertension.   Subjective:  Care Team: Primary Care Provider: Vicci Duwaine SQUIBB, DO ; Next Scheduled Visit: 1/12  Medication Access/Adherence  Current Pharmacy:  [facility] [id number] - [place], [place] - [address] [place] [address] Phone: [phone] Fax: [phone]  [facility] [address] [place] - [address] [place] [address] Phone: [phone] Fax: [phone]  [facility] - [place], [place] - [address] [place] [address] Phone: [phone] Fax: [phone]  -Patient reports affordability concerns with their medications: No  -Patient reports access/transportation concerns to their pharmacy: No  -Patient reports adherence concerns with their medications:  No    Hypertension: Current medications: amlodipine  2.5mg  daily,metoprolol  xl 25mg  daily -Patient is also prescribed hydralazine  25mg  daily, and this is given if SBP is >150 -Lisinopril  5mg  recently stopped due to hypotension- BP at OV 9/17 was 110/70 -Patient has a validated, automated, upper arm home BP cuff; and recently purchased a new one to verify accuracy  -Current blood pressure readings readings: fluctuate throughout the day with recent readings varying between 91/59 to 170/70; HR is typically 40-50 (rarely 60).  Patient/wife endorse proper BP technique and will check readings 2-3 times daily. -Changed from taking all BP medications in the evening to taking amlodipine  at 10am and metoprolol  at 10pm -BP continues to run low in the mornings prior to taking amlodipine  and is higher in the evenings around the time metoprolol  is taken -Current meal  patterns: does not monitor salt intake, but if BP is low will have a salty snack- patient endorses rare caffeine intake and drinks approximately 36oz of water daily along with milk and juice -Current physical activity: boxing -Patient is also on several other medications that can affect BP:  levodopa , baclofen , tamsulosin  can decrease blood pressure; ropinorole can both increase or decrease blood pressure (orthostatic hypotension); amantadine  has also been shown to cause orthostatic hypotension in patients.  All of these medications are being given at different times throughout the day, some multiple times daily.  Objective:  Lab Results  Component Value Date   CREATININE 1.25 02/15/2024   BUN 27 02/15/2024   NA 142 02/15/2024   K 4.2 02/15/2024   CL 107 (H) 02/15/2024   CO2 22 02/15/2024   Lab Results  Component Value Date   CHOL 135 02/15/2024   HDL 63 02/15/2024   LDLCALC 61 02/15/2024   TRIG 48 02/15/2024   Assessment/Plan:   Hypertension: -Currently uncontrolled -Recently advised by Dr. Gollan to move metoprolol  dosing up to 2pm, and Dr. Vicci advised to take tamsuloson 0.4mg  BID instead of 2 capsules at one time.  Patient will make these changes once current bill box is empty. -Continue to monitor and record BP readings 2-3x/d  -Increase water intake to help prevent orthostatic hypotension  [name], PharmD, DPLA

## 2024-02-17 ENCOUNTER — Other Ambulatory Visit: Payer: Self-pay

## 2024-02-17 DIAGNOSIS — I10 Essential (primary) hypertension: Secondary | ICD-10-CM

## 2024-02-18 ENCOUNTER — Ambulatory Visit: Payer: Self-pay | Admitting: Family Medicine

## 2024-02-23 ENCOUNTER — Other Ambulatory Visit: Payer: Self-pay

## 2024-02-23 NOTE — Patient Instructions (Signed)
 Visit Information  Thank you for taking time to visit with me today. Please don't hesitate to contact me if I can be of assistance to you before our next scheduled appointment.  Your next care management appointment is by telephone on Tuesday, December 9th at 10:00am.  Please call the care guide team at [phone] if you need to cancel, schedule, or reschedule an appointment.   Please call the [place]  National Suicide Prevention Lifeline: 934-320-9034 or TTY: (918)305-0834 TTY (806)161-0059) to talk to a trained counselor if you are experiencing a Mental Health or Behavioral Health Crisis or need someone to talk to.  Santana Stamp BSN, CCM [place]  VBCI Population Health RN Care Manager Direct Dial: [phone]  Fax: [phone]

## 2024-02-23 NOTE — Patient Outreach (Signed)
 Complex Care Management   Visit Note  02/23/2024  Name:  Thomas [name] MRN: [id number] DOB: [birth year]  Situation: Referral received for Complex Care Management related to Parkinsons. I obtained verbal consent from Caregiver.  Visit completed with Mrs. Mustaf Antonacci, wife/DPR  on the phone. Patient and wife are continuing to work on solutions to getting full-sized wheelchair up into their SUV.  LCSW has provided resources.   Background:   Past Medical History:  Diagnosis Date   Activated protein C resistance 06/28/2019   Arthritis of knee    Arthropathy of lumbar facet joint    Atrial fibrillation    BPH (benign prostatic hyperplasia) 09/10/2018   DVT (deep venous thrombosis)    L leg   Dysplastic nevus 05/15/2021   Left Superior Pectoral, mod to severe, Excised 07/16/21   Erectile dysfunction 03/01/2015   Herniated lumbar disc without myelopathy 10/11/2020   Hyperchloremia    Hypercholesterolemia    Hypertension    Inability to walk 02/22/2021   Insomnia due to medical condition 09/30/2018   Major depressive disorder 01/22/2022   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   Nephrolithiasis    Osteoarthritis    Parkinson's disease (HCC)    Plantar wart    Pneumonia    Post-operative nausea and vomiting 09/20/2020   Pulmonary embolism    Sciatica    Seasonal allergies    TIA (transient ischemic attack)     Assessment: Patient Reported Symptoms:  Cognitive Cognitive Status: Alert and oriented to person, place, and time, No symptoms reported, Insightful and able to interpret abstract concepts      Neurological Neurological Review of Symptoms: Not assessed    HEENT HEENT Symptoms Reported: Not assessed      Cardiovascular Cardiovascular Symptoms Reported: Not assessed    Respiratory Respiratory Symptoms Reported: Not assesed    Endocrine Endocrine Symptoms Reported: Not assessed    Gastrointestinal Gastrointestinal Symptoms Reported:  Not assessed      Genitourinary Genitourinary Symptoms Reported: Urgency Genitourinary Management Strategies: Incontinence garment/pad Genitourinary Self-Management Outcome: 4 (good) Genitourinary Comment: LCSW has given resources for [employer] that provided incont. supplies, wife will be utilizing this resource.  Integumentary Integumentary Symptoms Reported: Not assessed    Musculoskeletal Musculoskelatal Symptoms Reviewed: Limited mobility, Back pain Additional Musculoskeletal Details: Continues boxing classes 3 days per week to help with Parkinsons-drives himself-due to back pain has to find places to sit and takes breaks regularly -has a transport chair, PCP just ordered wheelchair, wife has already received a call from [employer] to connect-LCSW provided resources for a hydraulic lift for her car, she is in the process of connecting with [employer], a company in [place] that may have a solution for their vehicle -spouse active in parkinsons support group-1x per month Musculoskeletal Management Strategies: Routine screening, Medical device Musculoskeletal Self-Management Outcome: 3 (uncertain)      Psychosocial Psychosocial Symptoms Reported: Not assessed          02/23/2024    PHQ2-9 Depression Screening   Little interest or pleasure in doing things    Feeling down, depressed, or hopeless    PHQ-2 - Total Score    Trouble falling or staying asleep, or sleeping too much    Feeling tired or having little energy    Poor appetite or overeating     Feeling bad about yourself - or that you are a failure or have let yourself or your family down    Trouble concentrating on things, such as  reading the newspaper or watching television    Moving or speaking so slowly that other people could have noticed.  Or the opposite - being so fidgety or restless that you have been moving around a lot more than usual    Thoughts that you would be better off dead, or hurting yourself in some way     PHQ2-9 Total Score    If you checked off any problems, how difficult have these problems made it for you to do your work, take care of things at home, or get along with other people    Depression Interventions/Treatment      There were no vitals filed for this visit.    MEDICATIONS: No concerns with meds, no issues with refills, taking as prescribed.    Recommendation:   Continue Current Plan of Care Wife and patient will connect with [employer] for solutions to getting wheelchair into SUV.    Follow Up Plan:   Telephone follow-up in 1 month  Santana Stamp BSN, CCM [place]  VBCI Population Health RN Care Manager Direct Dial: [phone]  Fax: [phone]

## 2024-02-24 ENCOUNTER — Other Ambulatory Visit: Payer: Self-pay | Admitting: Family Medicine

## 2024-02-25 NOTE — Telephone Encounter (Signed)
 Requested Prescriptions  Refused Prescriptions Disp Refills   lisinopril  (ZESTRIL ) 5 MG tablet [Pharmacy Med Name: LISINOPRIL  5 MG TABLET] 90 tablet 0    Sig: TAKE 1 TABLET BY MOUTH DAILY     Cardiovascular:  ACE Inhibitors Passed - 02/25/2024  4:38 PM      Passed - Cr in normal range and within 180 days    Creatinine, Ser  Date Value Ref Range Status  02/15/2024 1.25 0.76 - 1.27 mg/dL Final         Passed - K in normal range and within 180 days    Potassium  Date Value Ref Range Status  02/15/2024 4.2 3.5 - 5.2 mmol/L Final         Passed - Patient is not pregnant      Passed - Last BP in normal range    BP Readings from Last 1 Encounters:  02/15/24 112/70         Passed - Valid encounter within last 6 months    Recent Outpatient Visits           1 week ago Hypercholesterolemia   [place] [facility] [place], [place], DO   1 month ago Primary hypertension   [place] [facility] [place], [place], DO   2 months ago Weight loss   [place] [facility] [place], Megan P, DO   3 months ago Primary hypertension   [place] [facility] Melvin Pao, NP   6 months ago Encounter for annual wellness visit (AWV) in Medicare patient   [place] [facility] [place], Duwaine SQUIBB, DO       Future Appointments             In 4 days MacDiarmid, [place], MD [facility] [place]   In 7 months Hester Alm BROCKS, MD [facility]

## 2024-02-29 ENCOUNTER — Ambulatory Visit: Admitting: Urology

## 2024-02-29 VITALS — BP 141/74 | HR 55 | Ht 69.5 in | Wt 213.0 lb

## 2024-02-29 DIAGNOSIS — R32 Unspecified urinary incontinence: Secondary | ICD-10-CM

## 2024-02-29 DIAGNOSIS — N3941 Urge incontinence: Secondary | ICD-10-CM

## 2024-02-29 LAB — MICROSCOPIC EXAMINATION: Epithelial Cells (non renal): >10 /HPF — AB (ref 0–10)

## 2024-02-29 LAB — URINALYSIS, COMPLETE
Bilirubin, UA: NEGATIVE
Glucose, UA: NEGATIVE
Leukocytes,UA: NEGATIVE
Nitrite, UA: NEGATIVE
Protein,UA: NEGATIVE
RBC, UA: NEGATIVE
Specific Gravity, UA: 1.03 (ref 1.005–1.030)
Urobilinogen, Ur: 0.2 mg/dL (ref 0.2–1.0)
pH, UA: 6 (ref 5.0–7.5)

## 2024-02-29 MED ORDER — GEMTESA 75 MG PO TABS: 75.0000 mg | ORAL_TABLET | Freq: Every day | ORAL | 3 refills | Status: AC

## 2024-02-29 NOTE — Progress Notes (Signed)
 02/29/2024 10:58 AM   Thomas [name] 19-Aug-1950 [id number]  Referring provider: Vicci Duwaine SQUIBB, DO [address] [place],  [place] [address]  No chief complaint on file.   HPI: I was consulted to assess the patient's urgency incontinence.  He had Parkinson's for approximately 13 years.  He voids every 60 to 90 minutes and gets up least 3 times a night and does not have ankle edema.  He has urge incontinence but no stress incontinence or bedwetting and does not wear a pad.  His flow is good   A few years ago he had low back surgery and 15 years ago he had a stroke.  He is prone to constipation.  He is on Flomax      Postvoid residual 0 mL   Patient has Parkinson's with frequency and urge incontinence.  I do not think he needs cystoscopy at this stage or urodynamics but may need 1 or both in the future.  I will see him back on Flomax  in combination with Myrbetriq  25 mg samples and a prescription and keep an eye on his postvoid urine volume.  Rare risk of retention discussed.  I did mention that further testing may be needed in the future depending on how he does with medical therapy    Send on Flomax  twice a day.  Oxybutynin  helped well at first but now 50% improved.  Still leaks some.  Myrbetriq  did not help.  He is prone to constipation.     patient will try Vesicare .  Stay on Flomax .  Check residual next visit.  We can always go back on oxybutynin .  We talked about the pros and cons of an antimuscarinic and parkinsonism patients.  I will discuss percutaneous tibial nerve stimulation next visit.  Appears that he is never tried Gemtesa     Today Patient actually tried trospium .  It did not help.  Still has urgency and frequency but is not incontinent according to him today.  I think from time to time he does have urge incontinence  clinically not infected    Discuss percutaneous tibial nerve stimulation full template. Handout given. Co-pay will be checked. Urine sent for culture. He would like to  go back on oxybutynin  ER 10 mg 30 x 11 is a partial responder and this was sent     Today Patient on oxybutynin  patient treated with percutaneous tibial nerve stimulation. Urgency and frequency dramatically better.  Very pleased.  Clinically not infected.  No urgency incontinence  Renew oxybutynin  90 x 3. Continue with maintenance PTNS. See in 1 year     Today Patient stopped PTNS due to lack of efficacy.  Now on Gemtesa  and doing well.  Less urge incontinence.  Reduce frequency.  No bladder infection   PMH: Past Medical History:  Diagnosis Date   Activated protein C resistance 06/28/2019   Arthritis of knee    Arthropathy of lumbar facet joint    Atrial fibrillation    BPH (benign prostatic hyperplasia) 09/10/2018   DVT (deep venous thrombosis)    L leg   Dysplastic nevus 05/15/2021   Left Superior Pectoral, mod to severe, Excised 07/16/21   Erectile dysfunction 03/01/2015   Herniated lumbar disc without myelopathy 10/11/2020   Hyperchloremia    Hypercholesterolemia    Hypertension    Inability to walk 02/22/2021   Insomnia due to medical condition 09/30/2018   Major depressive disorder 01/22/2022   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   Nephrolithiasis  Osteoarthritis    Parkinson's disease (HCC)    Plantar wart    Pneumonia    Post-operative nausea and vomiting 09/20/2020   Pulmonary embolism    Sciatica    Seasonal allergies    TIA (transient ischemic attack)     Surgical History: Past Surgical History:  Procedure Laterality Date   APPENDECTOMY     BOWEL DECOMPRESSION  10/15/2022   Procedure: BOWEL DECOMPRESSION;  Surgeon: Onita Elspeth Sharper, DO;  Location: [facility] ENDOSCOPY;  Service: Gastroenterology;;   CATARACT EXTRACTION Bilateral 10/2019 11/2019   COLON RESECTION SIGMOID N/A 10/17/2022   Procedure: COLON RESECTION SIGMOID;  Surgeon: Desiderio Schanz, MD;  Location: [facility] ORS;  Service: General;  Laterality: N/A;    COLONOSCOPY WITH PROPOFOL  N/A 07/27/2019   Procedure: COLONOSCOPY WITH PROPOFOL ;  Surgeon: Unk Corinn Skiff, MD;  Location: [facility] ENDOSCOPY;  Service: Endoscopy;  Laterality: N/A;   EYE SURGERY     FLEXIBLE SIGMOIDOSCOPY N/A 10/15/2022   Procedure: FLEXIBLE SIGMOIDOSCOPY;  Surgeon: Onita Elspeth Sharper, DO;  Location[facility] ENDOSCOPY;  Service: Gastroenterology;  Laterality: N/A;   KNEE ARTHROSCOPY WITH MENISCAL REPAIR Right 2009   KNEE SURGERY Right    LUMBAR LAMINECTOMY/DECOMPRESSION MICRODISCECTOMY Left 10/11/2020   Procedure: Left Lumbar Three-Four, Lumbar Fou-Five  Microdiscectomy;  Surgeon: Unice Pac, MD;  Location: [facility] OR;  Service: Neurosurgery;  Laterality: Left;   melanoma removal Left 07/16/2021   chest   PAIN PUMP IMPLANTATION     T&A     TONSILLECTOMY      Home Medications:  Allergies as of 02/29/2024       Reactions   Codeine Other (See Comments)   Hallucinations   Other Nausea Only   General anesthesia         Medication List        Accurate as of February 29, 2024 10:58 AM. If you have any questions, ask your nurse or doctor.          acetaminophen  500 MG tablet Commonly known as: TYLENOL  1 to 2 tablets as needed   amantadine  100 MG capsule Commonly known as: SYMMETREL  TAKE 1 CAPSULE BY MOUTH 3 TIMES A DAY   amLODipine  2.5 MG tablet Commonly known as: NORVASC  Take 1 tablet (2.5 mg total) by mouth daily.   baclofen  10 MG tablet Commonly known as: LIORESAL  Take 1 tablet (10 mg total) by mouth 3 (three) times daily.   carbidopa -levodopa  25-100 MG tablet Commonly known as: SINEMET  IR TAKE 2 TABLETS BY MOUTH 4 TIMES A DAY :  2 tablets at 6 AM/10 AM/2 PM/6 PM   carbidopa -levodopa  50-200 MG tablet Commonly known as: SINEMET  CR TAKE 1 TABLET BY MOUTH AT BEDTIME   cetirizine 10 MG tablet Commonly known as: ZYRTEC Take 10 mg by mouth at bedtime.   cholecalciferol  25 MCG (1000 UNIT) tablet Commonly known as: VITAMIN D3 Take 1,000 Units by  mouth daily.   cyanocobalamin 1000 MCG tablet Commonly known as: VITAMIN B12 Take 1,000 mcg by mouth daily.   diclofenac  Sodium 1 % Gel Commonly known as: VOLTAREN  APPLY TOPICALLY 4 GRAMS FOUR TIMES DAILY   DULoxetine  20 MG capsule Commonly known as: CYMBALTA  Take 1 capsule (20 mg total) by mouth daily.   folic acid 800 MCG tablet Commonly known as: FOLVITE Take 400 mcg by mouth daily.   Gemtesa  75 MG Tabs Generic drug: Vibegron  Take 1 tablet (75 mg total) by mouth daily.   hydrALAZINE  25 MG tablet Commonly known as: APRESOLINE  PRN   HYDROcodone -acetaminophen  5-325 MG  tablet Commonly known as: NORCO/VICODIN Take 1 tablet by mouth 2 (two) times daily.   Inbrija  42 MG Caps Generic drug: Levodopa  Samples of this drug were given to the patient, quantity 1 box of 32 capsules, Lot Number [id number] Exp: 03/2023  Patient has been educated on dosage and uses of medication.   Inbrija  42 MG Caps Generic drug: Levodopa  Samples of this drug were given to the patient, quantity 1, Lot Number [id number] Exp 12-24   Inbrija  42 MG Caps Generic drug: Levodopa  TWO capsules is ONE dosage (never inhale just one capsule).  You can inhale the capsules as needed up to 5 times per day, separated by 2 hour intervals.   melatonin 5 MG Tabs Take 5 mg by mouth at bedtime.   metoprolol  succinate 25 MG 24 hr tablet Commonly known as: TOPROL -XL Take 1 tablet (25 mg total) by mouth at bedtime.   mometasone  0.1 % cream Commonly known as: ELOCON  Apply twice daily to affected areas on back for dermatitis. Avoid applying to face, groin, and axilla.   multivitamin tablet Take 1 tablet by mouth daily at 6 PM.   polyethylene glycol powder 17 GM/SCOOP powder Commonly known as: GLYCOLAX /MIRALAX  Take 17 g by mouth at bedtime.   PreviDent 5000 Booster Plus 1.1 % Pste Generic drug: Sodium Fluoride  Place 1 application onto teeth 2 (two) times daily.   pyridoxine 100 MG tablet Commonly known as:  B-6 Take 100 mg by mouth daily.   rivaroxaban  20 MG Tabs tablet Commonly known as: Xarelto  Take 1 tablet (20 mg total) by mouth daily.   rOPINIRole  2 MG tablet Commonly known as: REQUIP  TAKE 1 TABLET BY MOUTH 3 TIMES A DAY   simvastatin  40 MG tablet Commonly known as: ZOCOR  Take 1 tablet (40 mg total) by mouth daily.   tamsulosin  0.4 MG Caps capsule Commonly known as: FLOMAX  Take 1 capsule (0.4 mg total) by mouth 2 (two) times daily.   traZODone  50 MG tablet Commonly known as: DESYREL  Take 1 tablet (50 mg total) by mouth at bedtime.        Allergies:  Allergies  Allergen Reactions   Codeine Other (See Comments)    Hallucinations   Other Nausea Only    General anesthesia     Family History: Family History  Problem Relation Age of Onset   Parkinson's disease Father    Coronary artery disease Mother    Heart attack Sister    Deep vein thrombosis Sister     Social History:  reports that he has never smoked. He has never been exposed to tobacco smoke. He has never used smokeless tobacco. He reports that he does not drink alcohol  and does not use drugs.  ROS:                                        Physical Exam: There were no vitals taken for this visit.  Constitutional:  Alert and oriented, No acute distress. HEENT: [place] AT, moist mucus membranes.  Trachea midline, no masses.  Laboratory Data: Lab Results  Component Value Date   WBC 6.0 02/15/2024   HGB 13.4 02/15/2024   HCT 41.8 02/15/2024   MCV 95 02/15/2024   PLT 164 02/15/2024    Lab Results  Component Value Date   CREATININE 1.25 02/15/2024    No results found for: PSA  No results found for: TESTOSTERONE  Lab Results  Component Value Date   HGBA1C 5.4 09/18/2020    Urinalysis    Component Value Date/Time   COLORURINE YELLOW (A) 10/15/2022 1107   APPEARANCEUR Clear 08/03/2023 1035   LABSPEC 1.009 10/15/2022 1107   PHURINE 7.0 10/15/2022 1107   GLUCOSEU  Negative 08/03/2023 1035   HGBUR NEGATIVE 10/15/2022 1107   BILIRUBINUR Negative 08/03/2023 1035   KETONESUR NEGATIVE 10/15/2022 1107   PROTEINUR 1+ (A) 08/03/2023 1035   PROTEINUR NEGATIVE 10/15/2022 1107   NITRITE Negative 08/03/2023 1035   NITRITE NEGATIVE 10/15/2022 1107   LEUKOCYTESUR Negative 08/03/2023 1035   LEUKOCYTESUR NEGATIVE 10/15/2022 1107    Pertinent Imaging:   Assessment & Plan: Renew Gemtesa  and see in 1 year.  90 x 3 sent to pharmacy.  Hopefully the stem cell injections in the lower back will greatly reduce his pain we talked about his Parkinson's today in length.  1. Urgency incontinence (Primary)  - Urinalysis, Complete  2. Urinary incontinence, unspecified type  - Urinalysis, Complete   No follow-ups on file.  Glendia DELENA Elizabeth, MD  [facility] [address] [place], [place] [address] [phone]

## 2024-03-03 ENCOUNTER — Encounter (HOSPITAL_COMMUNITY): Payer: Self-pay | Admitting: *Deleted

## 2024-03-03 ENCOUNTER — Ambulatory Visit (HOSPITAL_COMMUNITY)
Admission: EM | Admit: 2024-03-03 | Discharge: 2024-03-03 | Disposition: A | Discharge status: Home / Self Care | Attending: Family Medicine | Admitting: Family Medicine

## 2024-03-03 DIAGNOSIS — S61412A Laceration without foreign body of left hand, initial encounter: Secondary | ICD-10-CM | POA: Diagnosis not present

## 2024-03-03 DIAGNOSIS — S51012A Laceration without foreign body of left elbow, initial encounter: Secondary | ICD-10-CM | POA: Diagnosis not present

## 2024-03-03 MED ORDER — MUPIROCIN 2 % EX OINT: 1.0000 | TOPICAL_OINTMENT | Freq: Two times a day (BID) | CUTANEOUS | 0 refills | Status: AC
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
## 2024-03-04 ENCOUNTER — Other Ambulatory Visit: Payer: Self-pay | Admitting: *Deleted

## 2024-03-05 ENCOUNTER — Other Ambulatory Visit: Payer: Self-pay | Admitting: Neurology

## 2024-03-05 DIAGNOSIS — F0670 Mild neurocognitive disorder due to known physiological condition without behavioral disturbance: Secondary | ICD-10-CM

## 2024-03-05 DIAGNOSIS — G20A1 Parkinson's disease without dyskinesia, without mention of fluctuations: Secondary | ICD-10-CM

## 2024-03-05 NOTE — Patient Instructions (Signed)
 Visit Information  Thank you for taking time to visit with me today. Please don't hesitate to contact me if I can be of assistance to you before our next scheduled appointment.  Your next care management appointment is by telephone on 03/22/24 at 3pm    Please call the care guide team at [phone] if you need to cancel, schedule, or reschedule an appointment.   Please call the Suicide and Crisis Lifeline: 988 call the [place]  National Suicide Prevention Lifeline: (862)150-9024 or TTY: (415)511-3330 TTY 825-041-8419) to talk to a trained counselor call 1-800-273-TALK (toll free, 24 hour hotline) call 911 if you are experiencing a Mental Health or Behavioral Health Crisis or need someone to talk to.  Dashay Giesler, LCSW [place]  [facility], [facility] Licensed Clinical Social Worker  Direct Dial: [phone]

## 2024-03-05 NOTE — Patient Outreach (Signed)
 Complex Care Management   Visit Note  03/05/2024  Name:  Thomas [name] MRN: [id number] DOB: [birth year]  Situation: Referral received for Complex Care Management related to resources and support I obtained verbal consent from Patient.  Visit completed with Caregiver Patient  on the phone on 03/04/24  Background:   Past Medical History:  Diagnosis Date   Activated protein C resistance 06/28/2019   Arthritis of knee    Arthropathy of lumbar facet joint    Atrial fibrillation    BPH (benign prostatic hyperplasia) 09/10/2018   DVT (deep venous thrombosis)    L leg   Dysplastic nevus 05/15/2021   Left Superior Pectoral, mod to severe, Excised 07/16/21   Erectile dysfunction 03/01/2015   Herniated lumbar disc without myelopathy 10/11/2020   Hyperchloremia    Hypercholesterolemia    Hypertension    Inability to walk 02/22/2021   Insomnia due to medical condition 09/30/2018   Major depressive disorder 01/22/2022   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   Nephrolithiasis    Osteoarthritis    Parkinson's disease (HCC)    Plantar wart    Pneumonia    Post-operative nausea and vomiting 09/20/2020   Pulmonary embolism    Sciatica    Seasonal allergies    TIA (transient ischemic attack)     Assessment: Patient Reported Symptoms:  Cognitive Cognitive Status: Alert and oriented to person, place, and time, Insightful and able to interpret abstract concepts Cognitive/Intellectual Conditions Management [RPT]: Other      Neurological Neurological Review of Symptoms: No symptoms reported    HEENT HEENT Symptoms Reported: No symptoms reported      Cardiovascular Cardiovascular Symptoms Reported: No symptoms reported    Respiratory Respiratory Symptoms Reported: No symptoms reported    Endocrine Endocrine Symptoms Reported: No symptoms reported    Gastrointestinal Gastrointestinal Symptoms Reported: No symptoms reported      Genitourinary  Genitourinary Symptoms Reported: No symptoms reported    Integumentary Integumentary Symptoms Reported: No symptoms reported    Musculoskeletal Musculoskelatal Symptoms Reviewed: Limited mobility, Difficulty walking Additional Musculoskeletal Details: fell yesterday in the  bathroom in a restaurants. Patient seen by urgent care no bruising, no cuts Musculoskeletal Management Strategies: Routine screening, Medical device      Psychosocial Psychosocial Symptoms Reported: No symptoms reported          03/05/2024    PHQ2-9 Depression Screening   Little interest or pleasure in doing things    Feeling down, depressed, or hopeless    PHQ-2 - Total Score    Trouble falling or staying asleep, or sleeping too much    Feeling tired or having little energy    Poor appetite or overeating     Feeling bad about yourself - or that you are a failure or have let yourself or your family down    Trouble concentrating on things, such as reading the newspaper or watching television    Moving or speaking so slowly that other people could have noticed.  Or the opposite - being so fidgety or restless that you have been moving around a lot more than usual    Thoughts that you would be better off dead, or hurting yourself in some way    PHQ2-9 Total Score    If you checked off any problems, how difficult have these problems made it for you to do your work, take care of things at home, or get along with other people    Depression Interventions/Treatment  There were no vitals filed for this visit.    Medications Reviewed Today     Reviewed by Ermalinda Lenn HERO, LCSW (Social Worker) on 03/04/24 at 1305  Med List Status: <None>   Medication Order Taking? Sig Documenting Provider Last Dose Status Informant  acetaminophen  (TYLENOL ) 500 MG tablet [id number] Yes 1 to 2 tablets as needed [provider]  Active Spouse/Significant Other  amantadine  (SYMMETREL ) 100 MG capsule [id number] Yes TAKE 1  CAPSULE BY MOUTH 3 TIMES A DAY Tat, Rebecca S, DO  Active   amLODipine  (NORVASC ) 2.5 MG tablet [id number] Yes Take 1 tablet (2.5 mg total) by mouth daily. Johnson, Megan P, DO  Active   baclofen  (LIORESAL ) 10 MG tablet [id number] Yes Take 1 tablet (10 mg total) by mouth 3 (three) times daily. Johnson, Megan P, DO  Active   carbidopa -levodopa  (SINEMET  CR) 50-200 MG tablet [id number] Yes TAKE 1 TABLET BY MOUTH AT BEDTIME Tat, Asberry RAMAN, DO  Active   carbidopa -levodopa  (SINEMET  IR) 25-100 MG tablet [id number] Yes TAKE 2 TABLETS BY MOUTH 4 TIMES A DAY :  2 tablets at 6 AM/10 AM/2 PM/6 PM Tat, Asberry RAMAN, DO  Active   cetirizine (ZYRTEC) 10 MG tablet [id number] Yes Take 10 mg by mouth at bedtime. [provider]  Active Spouse/Significant Other  cholecalciferol  (VITAMIN D3) 25 MCG (1000 UNIT) tablet [id number] Yes Take 1,000 Units by mouth daily. [provider]  Active Spouse/Significant Other  cyanocobalamin (VITAMIN B12) 1000 MCG tablet [id number] Yes Take 1,000 mcg by mouth daily. [provider]  Active   diclofenac  Sodium (VOLTAREN ) 1 % GEL [id number] Yes APPLY TOPICALLY 4 GRAMS FOUR TIMES DAILY Johnson, Megan P, DO  Active Spouse/Significant Other  DULoxetine  (CYMBALTA ) 20 MG capsule [id number] Yes Take 1 capsule (20 mg total) by mouth daily. Johnson, Megan P, DO  Active   folic acid (FOLVITE) 800 MCG tablet [id number] Yes Take 400 mcg by mouth daily. [provider]  Active   hydrALAZINE  (APRESOLINE ) 25 MG tablet [id number] Yes PRN [provider]  Active   lisinopril  (ZESTRIL ) 30 MG tablet [id number] Yes  [provider]  Active   melatonin 5 MG TABS [id number] Yes Take 5 mg by mouth at bedtime. [provider]  Active Spouse/Significant Other  metoprolol  succinate (TOPROL -XL) 25 MG 24 hr tablet [id number] Yes Take 1 tablet (25 mg total) by mouth at bedtime. Johnson, Megan P, DO  Active   mometasone  (ELOCON ) 0.1 % cream [id number] Yes Apply twice  daily to affected areas on back for dermatitis. Avoid applying to face, groin, and axilla. Hester Alm BROCKS, MD  Active   Multiple Vitamin (MULTIVITAMIN) tablet [id number] Yes Take 1 tablet by mouth daily at 6 PM. [provider]  Active Spouse/Significant Other  mupirocin  ointment (BACTROBAN ) 2 % [id number] Yes Apply 1 Application topically 2 (two) times daily. Rolinda Rogue, MD  Active   polyethylene glycol powder (GLYCOLAX /MIRALAX ) 17 GM/SCOOP powder [id number] Yes Take 17 g by mouth at bedtime. Jens Durand, MD  Active            Med Note BEVERLEE JUDGE LITTIE Pablo Dec 08, 2022  3:36 PM)    PREVIDENT 5000 BOOSTER PLUS 1.1 % PSTE [id number] Yes Place 1 application onto teeth 2 (two) times daily. [provider]  Active Spouse/Significant Other  pyridoxine (B-6) 100 MG tablet [id number] Yes Take 100 mg by mouth daily. [provider]  Active   rivaroxaban  (XARELTO ) 20 MG TABS tablet [id number] Yes Take  1 tablet (20 mg total) by mouth daily. Johnson, Megan P, DO  Active   rOPINIRole  (REQUIP ) 2 MG tablet [id number] Yes TAKE 1 TABLET BY MOUTH 3 TIMES A DAY Tat, Rebecca S, DO  Active   simvastatin  (ZOCOR ) 40 MG tablet [id number] Yes Take 1 tablet (40 mg total) by mouth daily. Johnson, Megan P, DO  Active   tamsulosin  (FLOMAX ) 0.4 MG CAPS capsule [id number] Yes Take 1 capsule (0.4 mg total) by mouth 2 (two) times daily. Johnson, Megan P, DO  Active   traZODone  (DESYREL ) 50 MG tablet [id number] Yes Take 1 tablet (50 mg total) by mouth at bedtime. Johnson, Megan P, DO  Active   Vibegron  (GEMTESA ) 75 MG TABS [id number] Yes Take 1 tablet (75 mg total) by mouth daily. Gaston Hamilton, MD  Active             Recommendation:   PCP Follow-up Specialty provider follow-up as scheduled  Follow Up Plan:   Telephone follow up appointment date/time:  03/22/24  Lenn Mean, LCSW [place]  [facility], [facility] Licensed Clinical Social Worker  Direct Dial:  [phone]

## 2024-03-08 ENCOUNTER — Telehealth: Payer: Self-pay | Admitting: Family Medicine

## 2024-03-08 DIAGNOSIS — M47816 Spondylosis without myelopathy or radiculopathy, lumbar region: Secondary | ICD-10-CM | POA: Diagnosis not present

## 2024-03-08 DIAGNOSIS — M533 Sacrococcygeal disorders, not elsewhere classified: Secondary | ICD-10-CM | POA: Diagnosis not present

## 2024-03-08 DIAGNOSIS — G20A1 Parkinson's disease without dyskinesia, without mention of fluctuations: Secondary | ICD-10-CM

## 2024-03-08 DIAGNOSIS — M545 Low back pain, unspecified: Secondary | ICD-10-CM | POA: Diagnosis not present

## 2024-03-08 DIAGNOSIS — Z79891 Long term (current) use of opiate analgesic: Secondary | ICD-10-CM | POA: Diagnosis not present

## 2024-03-08 DIAGNOSIS — G8929 Other chronic pain: Secondary | ICD-10-CM | POA: Diagnosis not present

## 2024-03-08 NOTE — Telephone Encounter (Unsigned)
 Copied from CRM [id number]. Topic: Clinical - Home Health Verbal Orders >> Mar 08, 2024  1:11 PM Leonette SQUIBB wrote: Caller/Agency: [facility]  Callback Number: [phone]  Service Requested: she wants the order for the drive wheelchair to be faxed to [facility] medical  fax #  [phone] Any new concerns about the patient? No

## 2024-03-09 ENCOUNTER — Telehealth: Payer: Self-pay

## 2024-03-09 NOTE — Telephone Encounter (Signed)
 Order signed and faxed to [facility] as requested.   Called and notified patient's wife that this was done for them.

## 2024-03-09 NOTE — Telephone Encounter (Signed)
 Order for manual wheelchair placed based on last office note. Will have provider sign and fax to [facility] as requested.

## 2024-03-09 NOTE — Telephone Encounter (Signed)
 Copied from CRM #[id number]. Topic: Clinical - Order For Equipment >> Mar 09, 2024 10:46 AM Cynthia K wrote: Reason for CRM:  [facility] received an order for a wheelchair and they do not file through insurance. Please send order to [facility] so they will file with insurance. Please call Shanti with any questions at [phone]. Thanks

## 2024-03-15 ENCOUNTER — Ambulatory Visit: Admitting: Podiatry

## 2024-03-15 ENCOUNTER — Encounter: Payer: Self-pay | Admitting: Podiatry

## 2024-03-15 VITALS — Ht 69.5 in | Wt 213.0 lb

## 2024-03-15 DIAGNOSIS — M79675 Pain in left toe(s): Secondary | ICD-10-CM

## 2024-03-15 DIAGNOSIS — D2372 Other benign neoplasm of skin of left lower limb, including hip: Secondary | ICD-10-CM | POA: Diagnosis not present

## 2024-03-15 DIAGNOSIS — B351 Tinea unguium: Secondary | ICD-10-CM | POA: Diagnosis not present

## 2024-03-15 DIAGNOSIS — M79674 Pain in right toe(s): Secondary | ICD-10-CM

## 2024-03-15 NOTE — Progress Notes (Signed)
 Chief Complaint  Patient presents with   Plantar Warts    Pt is here due to plantar warts to the bottom of the left foot, states he has had these before on bilateral feet, was being treated at [facility] clinic for this issue, and went thru an extensive treatment to have them removed, has since came back on the left foot only, complains of pain and discomfort. Also complains of bilateral toenails due to thickness and is hard to trim, pt has parkinson's.    Subjective: 73 y.o. male presenting to the office today for evaluation of symptomatic skin lesions to the plantar aspect of the left foot ongoing for several years.  He has been treated in the past at [facility] clinic Also requesting nail debridement today.  History of Parkinson's disease and he is unable to trim his own nails   Past Medical History:  Diagnosis Date   Activated protein C resistance 06/28/2019   Arthritis of knee    Arthropathy of lumbar facet joint    Atrial fibrillation    BPH (benign prostatic hyperplasia) 09/10/2018   DVT (deep venous thrombosis)    L leg   Dysplastic nevus 05/15/2021   Left Superior Pectoral, mod to severe, Excised 07/16/21   Erectile dysfunction 03/01/2015   Herniated lumbar disc without myelopathy 10/11/2020   Hyperchloremia    Hypercholesterolemia    Hypertension    Inability to walk 02/22/2021   Insomnia due to medical condition 09/30/2018   Major depressive disorder 01/22/2022   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   Nephrolithiasis    Osteoarthritis    Parkinson's disease (HCC)    Plantar wart    Pneumonia    Post-operative nausea and vomiting 09/20/2020   Pulmonary embolism    Sciatica    Seasonal allergies    TIA (transient ischemic attack)     Past Surgical History:  Procedure Laterality Date   APPENDECTOMY     BOWEL DECOMPRESSION  10/15/2022   Procedure: BOWEL DECOMPRESSION;  Surgeon: Onita Elspeth Sharper, DO;  Location: [facility]  ENDOSCOPY;  Service: Gastroenterology;;   CATARACT EXTRACTION Bilateral 10/2019 11/2019   COLON RESECTION SIGMOID N/A 10/17/2022   Procedure: COLON RESECTION SIGMOID;  Surgeon: Desiderio Schanz, MD;  Location: [facility] ORS;  Service: General;  Laterality: N/A;   COLONOSCOPY WITH PROPOFOL  N/A 07/27/2019   Procedure: COLONOSCOPY WITH PROPOFOL ;  Surgeon: Unk Corinn Skiff, MD;  Location: [facility] ENDOSCOPY;  Service: Endoscopy;  Laterality: N/A;   EYE SURGERY     FLEXIBLE SIGMOIDOSCOPY N/A 10/15/2022   Procedure: FLEXIBLE SIGMOIDOSCOPY;  Surgeon: Onita Elspeth Sharper, DO;  Location: [facility] ENDOSCOPY;  Service: Gastroenterology;  Laterality: N/A;   KNEE ARTHROSCOPY WITH MENISCAL REPAIR Right 2009   KNEE SURGERY Right    LUMBAR LAMINECTOMY/DECOMPRESSION MICRODISCECTOMY Left 10/11/2020   Procedure: Left Lumbar Three-Four, Lumbar Fou-Five  Microdiscectomy;  Surgeon: Unice Pac, MD;  Location: [facility] OR;  Service: Neurosurgery;  Laterality: Left;   melanoma removal Left 07/16/2021   chest   PAIN PUMP IMPLANTATION     T&A     TONSILLECTOMY      Allergies  Allergen Reactions   Codeine Other (See Comments)    Hallucinations   Other Nausea Only    General anesthesia      Objective:  Physical Exam General: Alert and oriented x3 in no acute distress  Dermatology: Hyperkeratotic lesion(s) present on the plantar aspect of the left foot x 2. Pain on palpation with a central nucleated core noted. Skin  is warm, dry and supple bilateral lower extremities. Negative for open lesions or macerations.  Vascular: Palpable pedal pulses bilaterally. No edema or erythema noted. Capillary refill within normal limits.  Neurological: Grossly intact via light touch  Musculoskeletal Exam: Patient ambulatory.  History of Parkinson's  Assessment: 1.  Eccrine poroma multiple left forefoot 2.  Pain due to onychomycosis of toenails both   Plan of Care:  -Patient evaluated -Mechanical debridement of nails 1-5 bilateral  performed using a nail nipper without incident or bleeding -Excisional debridement of keratoic lesion(s) using a chisel blade was performed without incident.  -Salicylic acid applied with a bandaid -Recommend salicylic acid under occlusion for the next 3 weeks -Return to the clinic 4 weeks  Thresa EMERSON Sar, DPM [facility]  Dr. Thresa EMERSON Sar, DPM    [address], [place] [address]                Office [phone]  Fax [phone]

## 2024-03-16 ENCOUNTER — Telehealth: Payer: Self-pay | Admitting: Family Medicine

## 2024-03-16 DIAGNOSIS — L602 Onychogryphosis: Secondary | ICD-10-CM

## 2024-03-16 NOTE — Telephone Encounter (Signed)
 Copied from CRM #[id number]. Topic: Referral - Request for Referral >> Mar 16, 2024  9:29 AM Tobias CROME wrote: Did the patient discuss referral with their provider in the last year? Yes (If No - schedule appointment) (If Yes - send message)  Appointment offered? No  Type of order/referral and detailed reason for visit: Podiatrist - For continuous toenail clippings, [employer] requiring referral to continue toenail clippings - needing to see podiatrist about every 6 weeks.   Preference of office, provider, location: [facility] in [place]  If referral order, have you been seen by this specialty before? Yes, seen yesterday to get toenails trimmed and had feet checked.   Can we respond through MyChart? No, wife requesting call once referral has been completed, [phone]

## 2024-03-16 NOTE — Telephone Encounter (Signed)
 Referral placed.

## 2024-03-16 NOTE — Telephone Encounter (Signed)
 Routing to provider. Can referral be placed for the patient?

## 2024-03-22 ENCOUNTER — Other Ambulatory Visit: Payer: Self-pay

## 2024-03-22 ENCOUNTER — Other Ambulatory Visit: Payer: Self-pay | Admitting: *Deleted

## 2024-03-22 NOTE — Patient Outreach (Signed)
 Complex Care Management   Visit Note  03/22/2024  Name:  Thomas [name] MRN: [id number] DOB: [birth year]  Situation: Referral received for Complex Care Management related to Parkinson's.  I obtained verbal consent from Patient..  Visit completed with Patient and wife/caregiver on speaker  on the phone.  Had a fall 03/03/24, feet got crossed up, fell back onto buttocks, went to UC and received wound care to skin tear on left wrist.  Has chronic lower back pain, sees [facility] for treatment.    Background:    Past Medical History:  Diagnosis Date   Activated protein C resistance 06/28/2019   Arthritis of knee    Arthropathy of lumbar facet joint    Atrial fibrillation    BPH (benign prostatic hyperplasia) 09/10/2018   DVT (deep venous thrombosis)    L leg   Dysplastic nevus 05/15/2021   Left Superior Pectoral, mod to severe, Excised 07/16/21   Erectile dysfunction 03/01/2015   Herniated lumbar disc without myelopathy 10/11/2020   Hyperchloremia    Hypercholesterolemia    Hypertension    Inability to walk 02/22/2021   Insomnia due to medical condition 09/30/2018   Major depressive disorder 01/22/2022   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      Neurological Neurological Review of Symptoms: No symptoms reported    HEENT HEENT Symptoms Reported: No symptoms reported      Cardiovascular Cardiovascular Symptoms Reported: No symptoms reported    Respiratory Respiratory Symptoms Reported: No symptoms reported     Endocrine Endocrine Symptoms Reported: No symptoms reported Is patient diabetic?: No    Gastrointestinal Gastrointestinal Symptoms Reported: No symptoms reported Additional Gastrointestinal Details: Continues to take Miralx      Genitourinary Genitourinary Symptoms Reported: No symptoms reported    Integumentary Integumentary Symptoms Reported: Wound Other Integumentary Symptoms: Skin tear to left wrist, sustained during a fall on 03/03/24, went to Urgent Care for assessment, treating skin tear with antibiotic ointment, covering with bandaid.  Wife states bandages tear his skin, educated on cleaning wound, apply thin layer of vasoline, cover with gauze, wrap with Coban (may reuse Coban if price is a concern, as long as free of drainage/stain).    Musculoskeletal Musculoskelatal Symptoms Reviewed: Difficulty walking, Limited mobility Additional Musculoskeletal Details: [place] 03/03/24, seen by Urgent Care, did NOT hit his head, fell back on his buttocks. Will be going to [facility] for stem cell (umbilical) therapy to lower back.        Psychosocial Psychosocial Symptoms Reported: Not assessed          03/22/2024    PHQ2-9 Depression Screening   Little interest or pleasure in doing things    Feeling down, depressed, or hopeless    PHQ-2 - Total Score    Trouble falling or staying asleep, or sleeping too much    Feeling tired or having little energy    Poor appetite or overeating     Feeling bad about yourself - or that you are a failure or have let  yourself or your family down    Trouble concentrating on things, such as reading the newspaper or watching television    Moving or speaking so slowly that other people could have noticed.  Or the opposite - being so fidgety or restless that you have been moving around a lot more than usual    Thoughts that you would be better off dead, or hurting yourself in some way    PHQ2-9 Total Score    If you checked off any problems,  how difficult have these problems made it for you to do your work, take care of things at home, or get along with other people    Depression Interventions/Treatment      There were no vitals filed for this visit. Pain Scale: 0-10 Pain Score: 8  Pain Type: Chronic pain Pain Location: Back Pain Orientation: Lower  Medications Reviewed Today   Medications were not reviewed in this encounter     Recommendation:   Specialty provider follow-up : [facility] by phone on 03/23/24, appt date to be determined, stem cell therapy for lower back pain; PODIATRY 04/22/2024.   Follow Up Plan:   Telephone follow-up in 1 week  Santana Stamp BSN, CCM [place]  [facility] RN Care Manager Direct Dial: [phone]  Fax: [phone]

## 2024-03-22 NOTE — Patient Instructions (Signed)
 Visit Information  Thank you for taking time to visit with me today. Please don't hesitate to contact me if I can be of assistance to you before our next scheduled appointment.  Your next care management appointment is by telephone on Monday, December 15th at 10:00am.   Please call the care guide team at [phone] if you need to cancel, schedule, or reschedule an appointment.   Please call the [place]  National Suicide Prevention Lifeline: 787-242-7777 or TTY: 828-871-6805 TTY (682)262-8370) to talk to a trained counselor if you are experiencing a Mental Health or Behavioral Health Crisis or need someone to talk to.  Santana Stamp BSN, CCM [place]  [facility] RN Care Manager Direct Dial: [phone]  Fax: [phone]

## 2024-03-23 NOTE — Patient Instructions (Signed)
 Visit Information  Thank you for taking time to visit with me today. Please don't hesitate to contact me if I can be of assistance to you before our next scheduled appointment.  Your next care management appointment is no further scheduled appointments.   Closing From: [facility] Social Work Case Management  Please call the care guide team at [phone] if you need to cancel, schedule, or reschedule an appointment.   Please call the Suicide and Crisis Lifeline: 988 call the [place]  National Suicide Prevention Lifeline: 760-657-4711 or TTY: (731) 119-0510 TTY 973-588-7587) to talk to a trained counselor call 1-800-273-TALK (toll free, 24 hour hotline) call 911 if you are experiencing a Mental Health or Behavioral Health Crisis or need someone to talk to.  Casten Floren, LCSW   [facility], [facility] Licensed Clinical Social Worker  Direct Dial: [phone]

## 2024-03-23 NOTE — Patient Outreach (Signed)
 Complex Care Management   Visit Note  03/23/2024  Name:  Thomas [name] MRN: [id number] DOB: February 19, 1951  Situation: Referral received for Complex Care Management related to resources and support I obtained verbal consent from Patient.  Visit completed with Caregiver Patient  on the phone on 03/23/24  Background:   Past Medical History:  Diagnosis Date   Activated protein C resistance 06/28/2019   Arthritis of knee    Arthropathy of lumbar facet joint    Atrial fibrillation    BPH (benign prostatic hyperplasia) 09/10/2018   DVT (deep venous thrombosis)    L leg   Dysplastic nevus 05/15/2021   Left Superior Pectoral, mod to severe, Excised 07/16/21   Erectile dysfunction 03/01/2015   Herniated lumbar disc without myelopathy 10/11/2020   Hyperchloremia    Hypercholesterolemia    Hypertension    Inability to walk 02/22/2021   Insomnia due to medical condition 09/30/2018   Major depressive disorder 01/22/2022   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 Per patient's spouse, payment is in range but they also have other medical items to cover. CSW has not identified any financial assistance options but will follow up with patient's spouse if any options are identified. Patient Reported Symptoms:  Cognitive Cognitive Status: No symptoms reported, Normal speech and language skills Cognitive/Intellectual Conditions Management [RPT]: Other Other: Patient was diagnosed with Parkinson's   Health Maintenance Behaviors: Annual physical exam Healing Pattern: Average Health Facilitated by:  Rest  Neurological Neurological Review of Symptoms: Not assessed    HEENT HEENT Symptoms Reported: Not assessed      Cardiovascular Cardiovascular Symptoms Reported: Not assessed    Respiratory Respiratory Symptoms Reported: Not assesed    Endocrine Endocrine Symptoms Reported: Not assessed    Gastrointestinal Gastrointestinal Symptoms Reported: Not assessed      Genitourinary Genitourinary Symptoms Reported: Not assessed    Integumentary Integumentary Symptoms Reported: Not assessed    Musculoskeletal Musculoskelatal Symptoms Reviewed: Not assessed        Psychosocial Psychosocial Symptoms Reported: No symptoms reported Additional Psychological Details: Confirmed continued  plan to  follow up with [employer] regarding wheelchair hydraulic lift for patient's car-caregiver reports that she has contacted [employer] and has received a quote regarding the lift-will also plan on working with [employer] regarding a wheelchair for patient-once she is able to confirm the appropriate wheelchair needed,          03/23/2024    PHQ2-9 Depression Screening   Little interest or pleasure in doing things    Feeling down, depressed, or hopeless    PHQ-2 - Total Score    Trouble falling or staying asleep, or sleeping too much    Feeling tired or having little energy    Poor appetite or overeating     Feeling bad about yourself - or that you are a failure or have let yourself or your family down    Trouble concentrating on things, such as reading the newspaper or watching television    Moving or speaking so slowly that other people could have noticed.  Or the opposite - being so fidgety or restless that you have been moving around a lot more than usual    Thoughts that you would be better off dead, or hurting yourself in some way    PHQ2-9 Total Score    If you checked off any problems, how difficult have these problems made it for you to do your work, take care of things at home, or get  along with other people    Depression Interventions/Treatment      There were no vitals filed for this visit.    Medications Reviewed Today     Reviewed by Ermalinda Lenn HERO, LCSW (Social Worker) on 03/23/24 at 1721  Med List Status: <None>   Medication Order Taking? Sig Documenting Provider Last Dose Status Informant  acetaminophen  (TYLENOL ) 500 MG tablet [id number] Yes 1 to 2 tablets as needed [provider]  Active Spouse/Significant Other  amantadine  (SYMMETREL ) 100 MG capsule [id number] Yes TAKE 1 CAPSULE BY MOUTH 3 TIMES A DAY Tat, Rebecca S, DO  Active   amLODipine  (NORVASC ) 2.5 MG tablet [id number] Yes Take 1 tablet (2.5 mg total) by mouth daily. Johnson, Megan P, DO  Active   baclofen  (LIORESAL ) 10 MG tablet [id number] Yes Take 1 tablet (10 mg total) by mouth 3 (three) times daily. Johnson, Megan P, DO  Active   carbidopa -levodopa  (SINEMET  CR) 50-200 MG tablet [id number] Yes TAKE 1 TABLET BY MOUTH AT BEDTIME Tat, Asberry RAMAN, DO  Active   carbidopa -levodopa  (SINEMET  IR) 25-100 MG tablet [id number] Yes TAKE 2 TABLETS BY MOUTH 4 TIMES A DAY :  2 tablets at 6 AM/10 AM/2 PM/6 PM Tat, Asberry RAMAN, DO  Active   cetirizine (ZYRTEC) 10 MG tablet [id number] Yes Take 10 mg by mouth at bedtime. [provider]  Active Spouse/Significant Other  cholecalciferol  (VITAMIN D3) 25 MCG (1000 UNIT) tablet [id number] Yes Take 1,000 Units by mouth daily. [provider]  Active Spouse/Significant Other  cyanocobalamin (VITAMIN B12) 1000 MCG tablet [id number] Yes Take 1,000 mcg by mouth daily. [provider]  Active   diclofenac  Sodium (VOLTAREN ) 1 % GEL [id number] Yes APPLY TOPICALLY 4 GRAMS FOUR TIMES DAILY Johnson, Megan P, DO  Active Spouse/Significant Other  DULoxetine  (CYMBALTA ) 20 MG capsule [id number] Yes Take 1 capsule (20 mg total) by mouth daily. Johnson, Megan P, DO  Active   folic acid (FOLVITE) 800 MCG tablet [id number] Yes Take 400 mcg by mouth daily. [provider]  Active   hydrALAZINE  (APRESOLINE ) 25 MG tablet [id number] Yes PRN [provider]  Active   lisinopril  (ZESTRIL ) 30 MG tablet [id number] Yes  [provider]  Active   melatonin 5 MG TABS [id number] Yes Take 5 mg by mouth at bedtime. [provider]  Active Spouse/Significant Other  metoprolol  succinate (TOPROL -XL) 25 MG 24 hr tablet [id number] Yes Take 1 tablet (25 mg total) by mouth at bedtime. Johnson, Megan P, DO  Active   mometasone  (ELOCON ) 0.1 % cream [id number] Yes Apply twice daily to affected areas on back for dermatitis. Avoid applying to face, groin, and axilla. Hester Alm BROCKS, MD  Active   Multiple Vitamin (MULTIVITAMIN) tablet [id number] Yes Take 1 tablet by mouth daily at 6 PM. [provider]  Active Spouse/Significant Other  mupirocin  ointment (BACTROBAN ) 2 % [id number] Yes Apply 1 Application topically 2 (two) times daily. Rolinda Rogue, MD  Active   polyethylene glycol powder (GLYCOLAX /MIRALAX ) 17 GM/SCOOP powder [id number] Yes Take 17 g by mouth at bedtime. [place],  Aida, MD  Active            Med Note BEVERLEE JUDGE LITTIE Pablo Dec 08, 2022  3:36 PM)    PREVIDENT 5000 BOOSTER PLUS 1.1 % PSTE [id number] Yes Place 1 application onto teeth 2 (two) times daily. [provider]  Active Spouse/Significant Other  pyridoxine (B-6) 100 MG tablet [id number] Yes Take 100 mg by mouth daily. [provider]  Active   rivaroxaban  (XARELTO ) 20 MG TABS tablet [id number] Yes Take 1 tablet (20 mg total) by mouth daily. Johnson, Megan P, DO  Active   rOPINIRole  (REQUIP ) 2 MG tablet [id number] Yes TAKE 1 TABLET BY MOUTH 3 TIMES A DAY Tat, Rebecca S, DO  Active   simvastatin  (ZOCOR ) 40 MG tablet [id number] Yes Take 1 tablet (40 mg total) by mouth daily. Vicci, Megan P, DO  Active   tamsulosin  (FLOMAX ) 0.4 MG CAPS capsule [id number] Yes Take 1 capsule (0.4 mg total) by mouth 2 (two) times daily. Johnson, Megan P, DO  Active   traZODone   (DESYREL ) 50 MG tablet [id number] Yes Take 1 tablet (50 mg total) by mouth at bedtime. Johnson, Megan P, DO  Active   Vibegron  (GEMTESA ) 75 MG TABS [id number] Yes Take 1 tablet (75 mg total) by mouth daily. Gaston Hamilton, MD  Active             Recommendation:   PCP Follow-up Specialty provider follow-up as required  Follow Up Plan:   Closing From:  [facility] social work case management  Auriella Wieand, LCSW [place]  [facility] Licensed Clinical Social Worker  Direct Dial: [phone]

## 2024-03-26 ENCOUNTER — Other Ambulatory Visit: Payer: Self-pay | Admitting: Neurology

## 2024-03-26 DIAGNOSIS — G20A1 Parkinson's disease without dyskinesia, without mention of fluctuations: Secondary | ICD-10-CM

## 2024-03-28 ENCOUNTER — Other Ambulatory Visit: Payer: Self-pay

## 2024-03-28 NOTE — Patient Outreach (Addendum)
 Complex Care Management   Visit Note  03/28/2024  Name:  Thomas [name] MRN: [id number] DOB: 11-07-1950  Situation: Referral received for Complex Care Management related to Parkinson's. I obtained verbal consent from Caregiver.  Visit completed with wife/DPR, Thomas [name]  on the phone  Background:   Past Medical History:  Diagnosis Date   Activated protein C resistance 06/28/2019   Arthritis of knee    Arthropathy of lumbar facet joint    Atrial fibrillation    BPH (benign prostatic hyperplasia) 09/10/2018   DVT (deep venous thrombosis)    L leg   Dysplastic nevus 05/15/2021   Left Superior Pectoral, mod to severe, Excised 07/16/21   Erectile dysfunction 03/01/2015   Herniated lumbar disc without myelopathy 10/11/2020   Hyperchloremia    Hypercholesterolemia    Hypertension    Inability to walk 02/22/2021   Insomnia due to medical condition 09/30/2018   Major depressive disorder 01/22/2022   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   Nephrolithiasis    Osteoarthritis    Parkinson's disease (HCC)    Plantar wart    Pneumonia    Post-operative nausea and vomiting 09/20/2020   Pulmonary embolism    Sciatica    Seasonal allergies    TIA (transient ischemic attack)     Assessment: Patient Reported Symptoms:  Cognitive Cognitive Status: No symptoms reported (Assessment performed with wife/DPR)      Neurological Neurological Review of Symptoms: Not assessed    HEENT HEENT Symptoms Reported: Not assessed      Cardiovascular Cardiovascular Symptoms Reported: Not assessed    Respiratory Respiratory Symptoms Reported: Not assesed    Endocrine Endocrine Symptoms Reported: Not assessed Is patient diabetic?: No    Gastrointestinal Gastrointestinal Symptoms Reported: No symptoms reported      Genitourinary Genitourinary Symptoms Reported: Not assessed    Integumentary Integumentary Symptoms Reported: Other Other Integumentary Symptoms:  Wife reports skin tear is healing, has a scab over it.    Musculoskeletal Musculoskelatal Symptoms Reviewed: Not assessed        Psychosocial Psychosocial Symptoms Reported: Not assessed          03/28/2024    PHQ2-9 Depression Screening   Little interest or pleasure in doing things    Feeling down, depressed, or hopeless    PHQ-2 - Total Score    Trouble falling or staying asleep, or sleeping too much    Feeling tired or having little energy    Poor appetite or overeating     Feeling bad about yourself - or that you are a failure or have let yourself or your family down    Trouble concentrating on things, such as reading the newspaper or watching television    Moving or speaking so slowly that other people could have noticed.  Or the opposite - being so fidgety or restless that you have been moving around a lot more than usual    Thoughts that you would be better off dead, or hurting yourself in some way    PHQ2-9 Total Score    If you checked off any problems, how difficult have these problems made it for you to do your work, take care of things at home, or get along with other people    Depression Interventions/Treatment      There were no vitals filed for this visit.    MEDICATIONS: Confirmed patient is taking hydrocodone /ace for pain, has Miralax  to take to keep bowels moving.    Recommendation:  Discussed upcoming appts:  PCP 04/25/24 Specialty provider follow-up : Podiatry 04/22/24  Continue Current Plan of Care  Follow Up Plan:   Telephone follow-up in 1 month  Santana Stamp BSN, CCM [place]  [facility] Population Health RN Care Manager Direct Dial: [phone]  Fax: [phone]

## 2024-03-28 NOTE — Patient Instructions (Signed)
 Visit Information  Thank you for taking time to visit with me today. Please don't hesitate to contact me if I can be of assistance to you before our next scheduled appointment.  Your next care management appointment is by telephone on Monday, January 5th at 11:00am.   Please call the care guide team at [phone] if you need to cancel, schedule, or reschedule an appointment.   Please call the [place]  National Suicide Prevention Lifeline: 574-100-9425 or TTY: 442-778-7880 TTY 534-469-5712) to talk to a trained counselor if you are experiencing a Mental Health or Behavioral Health Crisis or need someone to talk to.  Santana Stamp BSN, CCM   [facility] Population Health RN Care Manager Direct Dial: [phone]  Fax: [phone]

## 2024-04-02 ENCOUNTER — Other Ambulatory Visit: Payer: Self-pay | Admitting: Neurology

## 2024-04-02 DIAGNOSIS — G20B2 Parkinson's disease with dyskinesia, with fluctuations: Secondary | ICD-10-CM

## 2024-04-02 DIAGNOSIS — G20B1 Parkinson's disease with dyskinesia, without mention of fluctuations: Secondary | ICD-10-CM

## 2024-04-02 DIAGNOSIS — G20A1 Parkinson's disease without dyskinesia, without mention of fluctuations: Secondary | ICD-10-CM

## 2024-04-18 ENCOUNTER — Telehealth: Payer: Self-pay

## 2024-04-18 ENCOUNTER — Other Ambulatory Visit: Payer: Self-pay

## 2024-04-18 DIAGNOSIS — R32 Unspecified urinary incontinence: Secondary | ICD-10-CM

## 2024-04-18 DIAGNOSIS — N3941 Urge incontinence: Secondary | ICD-10-CM

## 2024-04-18 NOTE — Patient Instructions (Signed)
 Visit Information  Thank you for taking time to visit with me today. Please don't hesitate to contact me if I can be of assistance to you before our next scheduled appointment.  Your next care management appointment is by telephone on Monday, February 2nd  at 11:00am.  Please call the care guide team at [phone] if you need to cancel, schedule, or reschedule an appointment.   Please call the [place]  National Suicide Prevention Lifeline: 7813221987 or TTY: (818) 531-1388 TTY [phone]) to talk to a trained counselor if you are experiencing a Mental Health or Behavioral Health Crisis or need someone to talk to.  Santana Stamp BSN, CCM [place]  [facility] Population Health RN Care Manager Direct Dial: [phone]  Fax: [phone]

## 2024-04-18 NOTE — Patient Outreach (Signed)
 Complex Care Management   Visit Note  04/18/2024  Name:  Thomas [name] MRN: [id number] DOB: [birth year]  Situation: Referral received for Complex Care Management related to Parkinson's, Impaired Mobility. I obtained verbal consent from Caregiver Patient.  Visit completed with patient and wife, Thomas [name]  on the phone.  Continues to be treated for chronic back pain by [facility], rates pain 7-8/10 at its worst, and has appointment 05/09/24 for stem cell injection to lower back.  Wife has placed a call to Urology due to increased urination (he has had to increase his water intake to 64oz a day to prepare for stem cell injection).   Needs resource for solution to a 3 transition to get patient out of home.    Background:   Past Medical History:  Diagnosis Date   Activated protein C resistance 06/28/2019   Arthritis of knee    Arthropathy of lumbar facet joint    Atrial fibrillation    BPH (benign prostatic hyperplasia) 09/10/2018   DVT (deep venous thrombosis)    L leg   Dysplastic nevus 05/15/2021   Left Superior Pectoral, mod to severe, Excised 07/16/21   Erectile dysfunction 03/01/2015   Herniated lumbar disc without myelopathy 10/11/2020   Hyperchloremia    Hypercholesterolemia    Hypertension    Inability to walk 02/22/2021   Insomnia due to medical condition 09/30/2018   Major depressive disorder 01/22/2022   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   Nephrolithiasis    Osteoarthritis    Parkinson's disease (HCC)    Plantar wart    Pneumonia    Post-operative nausea and vomiting 09/20/2020   Pulmonary embolism    Sciatica    Seasonal allergies    TIA (transient ischemic attack)     Assessment: Patient Reported Symptoms:  Cognitive Cognitive Status: No symptoms reported (Assessment performed with wife and patient and speaker phone.)      Neurological Neurological Review of Symptoms: Weakness Neurological Comment: Parkinson's, he  is getting weaker in the afternoons and is using full-sized wheelchair so wife can move him around to the table for meals.  Continues to see Dr. Tat/Neurology, next appt 05/02/2024.  HEENT HEENT Symptoms Reported: Not assessed      Cardiovascular Cardiovascular Symptoms Reported: Not assessed    Respiratory Respiratory Symptoms Reported: Not assesed    Endocrine Endocrine Symptoms Reported: Not assessed Is patient diabetic?: No    Gastrointestinal Gastrointestinal Symptoms Reported: No symptoms reported      Genitourinary Genitourinary Symptoms Reported: Incontinence, Frequency Additional Genitourinary Details: Patient is having to drink at least 64oz a day to prepare for stem cell therapy injection.  Bladder incontinence has increased, wife has placed call today to Urology to ask about increase in bladder medication.    Integumentary Integumentary Symptoms Reported: No symptoms reported    Musculoskeletal Musculoskelatal Symptoms Reviewed: Difficulty walking, Unsteady gait, Weakness        Psychosocial Psychosocial Symptoms Reported: Not assessed          04/18/2024    PHQ2-9 Depression Screening   Little interest or pleasure in doing things    Feeling down, depressed, or hopeless    PHQ-2 - Total Score    Trouble falling or staying asleep, or sleeping too much    Feeling tired or having little energy    Poor appetite or overeating     Feeling bad about yourself - or that you are a failure or have let yourself or your  family down    Trouble concentrating on things, such as reading the newspaper or watching television    Moving or speaking so slowly that other people could have noticed.  Or the opposite - being so fidgety or restless that you have been moving around a lot more than usual    Thoughts that you would be better off dead, or hurting yourself in some way    PHQ2-9 Total Score    If you checked off any problems, how difficult have these problems made it for you to do  your work, take care of things at home, or get along with other people    Depression Interventions/Treatment      There were no vitals filed for this visit. Pain Scale: 0-10 Pain Score: 7  Pain Type: Chronic pain Pain Location: Back Pain Orientation: Lower Pain Descriptors / Indicators: Aching Pain Intervention(s): Medication (See eMAR), Other (Comment) (He goes to [facility] for hydrocodone /ace, he will be getting stem cell injection on 05/09/24 by [facility].)  MEDICATIONS: Continues to take hydrocodone /ACE 10/325 as needed.     Recommendation:   PCP follow up 04/25/2024 Specialty provider follow-up : Podiatry 04/22/24; Neurology 05/02/24;Stem Cell injection on 05/09/24 with [facility]: [facility] February 2026;  Continue Current Plan of Care Wife given resource for solution to transition a 3 rise out of the home: [address] [employer], phone [phone].    Follow Up Plan:   Telephone follow-up in 1 month as requested by wife.   Santana Stamp BSN, CCM [place]  [facility] Population Health RN Care Manager Direct Dial: [phone]  Fax: [phone]

## 2024-04-18 NOTE — Telephone Encounter (Signed)
 Pts wife [name] LM on triage line-  Requesting something stronger than Gemtesa  be sent to the pharmacy. He is unable to control his bladder. He has stem cell therapy in about a month. He is currently having to drink 8- 8oz of water each day.   Pls advise.

## 2024-04-19 ENCOUNTER — Telehealth: Payer: Self-pay

## 2024-04-19 NOTE — Telephone Encounter (Signed)
 Error

## 2024-04-20 MED ORDER — OXYBUTYNIN CHLORIDE ER 10 MG PO TB24: 10.0000 mg | ORAL_TABLET | Freq: Every day | ORAL | 11 refills | Status: AC

## 2024-04-20 NOTE — Addendum Note (Signed)
 Addended byBETHA CORIE PLATER on: 04/20/2024 01:29 PM   Modules accepted: Orders

## 2024-04-20 NOTE — Telephone Encounter (Signed)
 Pt's wife informed, voiced understanding.

## 2024-04-22 ENCOUNTER — Ambulatory Visit: Admitting: Podiatry

## 2024-04-25 ENCOUNTER — Ambulatory Visit: Admitting: Family Medicine

## 2024-04-26 ENCOUNTER — Ambulatory Visit: Admitting: Podiatry

## 2024-04-26 ENCOUNTER — Encounter: Payer: Self-pay | Admitting: Podiatry

## 2024-04-26 VITALS — Ht 69.5 in | Wt 213.0 lb

## 2024-04-26 DIAGNOSIS — D2372 Other benign neoplasm of skin of left lower limb, including hip: Secondary | ICD-10-CM

## 2024-04-26 NOTE — Progress Notes (Signed)
 "  Chief Complaint  Patient presents with   Plantar Warts    Pt is here to f/u on the left foot due to plantar warts states its looking better then before, still has some pain if he is on his feet a lot.    Subjective: 74 y.o. male presenting to the office today for follow-up evaluation of symptomatic skin lesions to the plantar aspect of the left foot ongoing for several years.  He has been treated in the past at [facility] Also requesting nail debridement today.  History of Parkinson's disease and he is unable to trim his own nails   Past Medical History:  Diagnosis Date   Activated protein C resistance 06/28/2019   Arthritis of knee    Arthropathy of lumbar facet joint    Atrial fibrillation    BPH (benign prostatic hyperplasia) 09/10/2018   DVT (deep venous thrombosis)    L leg   Dysplastic nevus 05/15/2021   Left Superior Pectoral, mod to severe, Excised 07/16/21   Erectile dysfunction 03/01/2015   Herniated lumbar disc without myelopathy 10/11/2020   Hyperchloremia    Hypercholesterolemia    Hypertension    Inability to walk 02/22/2021   Insomnia due to medical condition 09/30/2018   Major depressive disorder 01/22/2022   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   Nephrolithiasis    Osteoarthritis    Parkinson's disease (HCC)    Plantar wart    Pneumonia    Post-operative nausea and vomiting 09/20/2020   Pulmonary embolism    Sciatica    Seasonal allergies    TIA (transient ischemic attack)     Past Surgical History:  Procedure Laterality Date   APPENDECTOMY     BOWEL DECOMPRESSION  10/15/2022   Procedure: BOWEL DECOMPRESSION;  Surgeon: Onita Elspeth Sharper, DO;  Location: [facility] ENDOSCOPY;  Service: Gastroenterology;;   CATARACT EXTRACTION Bilateral 10/2019 11/2019   COLON RESECTION SIGMOID N/A 10/17/2022   Procedure: COLON RESECTION SIGMOID;  Surgeon: Desiderio Schanz, MD;  Location: [facility] ORS;  Service: General;  Laterality:  N/A;   COLONOSCOPY WITH PROPOFOL  N/A 07/27/2019   Procedure: COLONOSCOPY WITH PROPOFOL ;  Surgeon: Unk Corinn Skiff, MD;  Location: [facility] ENDOSCOPY;  Service: Endoscopy;  Laterality: N/A;   EYE SURGERY     FLEXIBLE SIGMOIDOSCOPY N/A 10/15/2022   Procedure: FLEXIBLE SIGMOIDOSCOPY;  Surgeon: Onita Elspeth Sharper, DO;  Location: [facility] ENDOSCOPY;  Service: Gastroenterology;  Laterality: N/A;   KNEE ARTHROSCOPY WITH MENISCAL REPAIR Right 2009   KNEE SURGERY Right    LUMBAR LAMINECTOMY/DECOMPRESSION MICRODISCECTOMY Left 10/11/2020   Procedure: Left Lumbar Three-Four, Lumbar Fou-Five  Microdiscectomy;  Surgeon: Unice Pac, MD;  Location: [facility] OR;  Service: Neurosurgery;  Laterality: Left;   melanoma removal Left 07/16/2021   chest   PAIN PUMP IMPLANTATION     T&A     TONSILLECTOMY      Allergies  Allergen Reactions   Codeine Other (See Comments)    Hallucinations   Other Nausea Only    General anesthesia      Objective:  Physical Exam General: Alert and oriented x3 in no acute distress  Dermatology: Hyperkeratotic lesion(s) present on the plantar aspect of the left foot x 2. Pain on palpation with a central nucleated core noted. Skin is warm, dry and supple bilateral lower extremities. Negative for open lesions or macerations.  Vascular: Palpable pedal pulses bilaterally. No edema or erythema noted. Capillary refill within normal limits.  Neurological: Grossly intact via light touch  Musculoskeletal  Exam: Patient ambulatory.  History of Parkinson's  Assessment: 1.  Eccrine poroma multiple left forefoot 2.  Pain due to onychomycosis of toenails both   Plan of Care:  -Patient evaluated -Excisional debridement of keratoic lesion(s) using a chisel blade was performed without incident.  -Salicylic acid applied with a bandaid -Recommend continued salicylic acid under occlusion for the next 3 weeks -Return to the clinic 4 weeks  Thresa EMERSON Sar, DPM [facility]  Dr. Thresa EMERSON Sar, DPM    [address] [place], [place] [address]                Office [phone]  Fax [phone]     "

## 2024-04-27 ENCOUNTER — Encounter: Payer: Self-pay | Admitting: Family Medicine

## 2024-04-27 ENCOUNTER — Ambulatory Visit: Admitting: Family Medicine

## 2024-04-27 VITALS — BP 135/77 | HR 61 | Temp 98.0°F | Ht 69.5 in | Wt 222.0 lb

## 2024-04-27 DIAGNOSIS — F3341 Major depressive disorder, recurrent, in partial remission: Secondary | ICD-10-CM

## 2024-04-27 DIAGNOSIS — R635 Abnormal weight gain: Secondary | ICD-10-CM | POA: Diagnosis not present

## 2024-04-27 DIAGNOSIS — G20A1 Parkinson's disease without dyskinesia, without mention of fluctuations: Secondary | ICD-10-CM

## 2024-04-27 DIAGNOSIS — R29890 Loss of height: Secondary | ICD-10-CM

## 2024-04-27 NOTE — Assessment & Plan Note (Signed)
 Wife has been having more trouble helping him move around and lifting him. She is concerned about being able to care for him as he progressing. Will get home health out for PT to help with facilitation of home safety and to help educate wife on how to safely lift and get him around.

## 2024-04-27 NOTE — Progress Notes (Signed)
 "  BP 135/77   Pulse 61   Temp 98 F (36.7 C) (Oral)   Ht 5' 9.5 (1.765 m)   Wt 222 lb (100.7 kg)   SpO2 98%   BMI 32.31 kg/m    Subjective:    Patient ID: Thomas [name], male    DOB: 1950/05/30, 74 y.o.   MRN: [id number]  HPI: Thomas [name] is a 74 y.o. male  Chief Complaint  Patient presents with   Depression   He has been putting on weight and is concerned about losing height. Wife is concerned about being able to lift him appropriately if he continues to decline.   DEPRESSION Mood status: stable Satisfied with current treatment?: yes Symptom severity: moderate  Duration of current treatment : chronic Side effects: no Medication compliance: good compliance Psychotherapy/counseling: no  Previous psychiatric medications: cymbalta  Depressed mood: yes Anxious mood: yes Anhedonia: no Significant weight loss or gain: yes Insomnia: no  Fatigue: yes Feelings of worthlessness or guilt: yes Impaired concentration/indecisiveness: yes Suicidal ideations: no Hopelessness: no Crying spells: no    04/27/2024   11:04 AM 01/21/2024    2:24 PM 01/11/2024    2:27 PM 12/30/2023    9:38 AM 10/29/2023    2:33 PM  Depression screen PHQ 2/9  Decreased Interest 1 0 2 2 0  Down, Depressed, Hopeless 1 0 0 0 0  PHQ - 2 Score 2 0 2 2 0  Altered sleeping    1 0  Tired, decreased energy    1 0  Change in appetite    2 0  Feeling bad or failure about yourself     0 0  Trouble concentrating    3 0  Moving slowly or fidgety/restless    3 0  Suicidal thoughts    0 0  PHQ-9 Score    12  0   Difficult doing work/chores    Somewhat difficult Not difficult at all     Data saved with a previous flowsheet row definition    Relevant past medical, surgical, family and social history reviewed and updated as indicated. Interim medical history since our last visit reviewed. Allergies and medications reviewed and updated.  Review of Systems  Constitutional: Negative.   Respiratory: Negative.     Cardiovascular: Negative.   Gastrointestinal: Negative.   Musculoskeletal:  Positive for back pain, gait problem and myalgias. Negative for arthralgias, joint swelling, neck pain and neck stiffness.  Skin: Negative.     Per HPI unless specifically indicated above     Objective:    BP 135/77   Pulse 61   Temp 98 F (36.7 C) (Oral)   Ht 5' 9.5 (1.765 m)   Wt 222 lb (100.7 kg)   SpO2 98%   BMI 32.31 kg/m   Wt Readings from Last 3 Encounters:  04/27/24 222 lb (100.7 kg)  04/26/24 213 lb (96.6 kg)  03/15/24 213 lb (96.6 kg)    Physical Exam Vitals and nursing note reviewed.  Constitutional:      General: He is not in acute distress.    Appearance: Normal appearance. He is obese. He is not ill-appearing, toxic-appearing or diaphoretic.  HENT:     Head: Normocephalic and atraumatic.     Right Ear: External ear normal.     Left Ear: External ear normal.     Nose: Nose normal.     Mouth/Throat:     Mouth: Mucous membranes are moist.     Pharynx: Oropharynx is clear.  Eyes:     General: No scleral icterus.       Right eye: No discharge.        Left eye: No discharge.     Extraocular Movements: Extraocular movements intact.     Conjunctiva/sclera: Conjunctivae normal.     Pupils: Pupils are equal, round, and reactive to light.  Cardiovascular:     Rate and Rhythm: Normal rate and regular rhythm.     Pulses: Normal pulses.     Heart sounds: Normal heart sounds. No murmur heard.    No friction rub. No gallop.  Pulmonary:     Effort: Pulmonary effort is normal. No respiratory distress.     Breath sounds: Normal breath sounds. No stridor. No wheezing, rhonchi or rales.  Chest:     Chest wall: No tenderness.  Musculoskeletal:        General: Normal range of motion.     Cervical back: Normal range of motion and neck supple.  Skin:    General: Skin is warm and dry.     Capillary Refill: Capillary refill takes less than 2 seconds.     Coloration: Skin is not jaundiced or  pale.     Findings: No bruising, erythema, lesion or rash.  Neurological:     General: No focal deficit present.     Mental Status: He is alert and oriented to person, place, and time. Mental status is at baseline.     Comments: Hunched over  Psychiatric:        Mood and Affect: Mood normal.        Behavior: Behavior normal.        Thought Content: Thought content normal.        Judgment: Judgment normal.     Results for orders placed or performed in visit on 02/29/24  Microscopic Examination   Collection Time: 02/29/24 10:58 AM   Urine  Result Value Ref Range   WBC, UA 0-5 0 - 5 /hpf   RBC, Urine 0-2 0 - 2 /hpf   Epithelial Cells (non renal) >10 (A) 0 - 10 /hpf   Crystals Present (A) N/A   Crystal Type Amorphous Sediment N/A   Mucus, UA Present (A) Not Estab.   Bacteria, UA Few None seen/Few  Urinalysis, Complete   Collection Time: 02/29/24 10:58 AM  Result Value Ref Range   Specific Gravity, UA 1.030 1.005 - 1.030   pH, UA 6.0 5.0 - 7.5   Color, UA Yellow Yellow   Appearance Ur Slightly cloudy Clear   Leukocytes,UA Negative Negative   Protein,UA Negative Negative/Trace   Glucose, UA Negative Negative   Ketones, UA Trace (A) Negative   RBC, UA Negative Negative   Bilirubin, UA Negative Negative   Urobilinogen, Ur 0.2 0.2 - 1.0 mg/dL   Nitrite, UA Negative Negative   Microscopic Examination Comment    Microscopic Examination See below:       Assessment & Plan:   Problem List Items Addressed This Visit       Nervous and Auditory   Parkinson's disease (HCC) - Primary   Wife has been having more trouble helping him move around and lifting him. She is concerned about being able to care for him as he progressing. Will get home health out for PT to help with facilitation of home safety and to help educate wife on how to safely lift and get him around.      Relevant Orders   Ambulatory referral to Home Health  Other   Major depressive disorder   Doing well on  current regimen. Continue current regimen. Continue to monitor.       Other Visit Diagnoses       Height loss       Discussed that this is likely due to inability to measure him accurately due to muscle weakness. Reassured patinet.     Weight gain       Encouraged him to cut back on ensures. Recheck in about 4 months.        Follow up plan: Return in about 4 months (around 08/25/2024) for physical.  >30 minutes spent with patient and wife today    "

## 2024-04-27 NOTE — Assessment & Plan Note (Signed)
Doing well on current regimen. Continue current regimen. Continue to monitor.

## 2024-04-28 ENCOUNTER — Encounter: Payer: Self-pay | Admitting: Family Medicine

## 2024-05-02 ENCOUNTER — Ambulatory Visit: Admitting: Neurology

## 2024-05-06 ENCOUNTER — Ambulatory Visit: Admitting: Podiatry

## 2024-05-16 ENCOUNTER — Other Ambulatory Visit: Payer: Self-pay

## 2024-05-16 NOTE — Patient Instructions (Signed)
 Visit Information  Thank you for taking time to visit with me today. Please don't hesitate to contact me if I can be of assistance to you before our next scheduled appointment.  Your next care management appointment is by telephone on Monday, March 2nd at 11:00am.  Please call the care guide team at [phone] if you need to cancel, schedule, or reschedule an appointment.   Please call the [place]  National Suicide Prevention Lifeline: 786-389-0405 or TTY: 660-630-5903 TTY [phone]) to talk to a trained counselor if you are experiencing a Mental Health or Behavioral Health Crisis or need someone to talk to.  Santana Stamp BSN, CCM [place]  [facility] Population Health RN Care Manager Direct Dial: [phone]  Fax: [phone]

## 2024-05-17 ENCOUNTER — Ambulatory Visit (INDEPENDENT_AMBULATORY_CARE_PROVIDER_SITE_OTHER): Admitting: Podiatry

## 2024-05-17 ENCOUNTER — Encounter: Payer: Self-pay | Admitting: Podiatry

## 2024-05-17 VITALS — Ht 69.5 in | Wt 222.0 lb

## 2024-05-17 DIAGNOSIS — D2372 Other benign neoplasm of skin of left lower limb, including hip: Secondary | ICD-10-CM

## 2024-05-17 NOTE — Progress Notes (Signed)
 "  Chief Complaint  Patient presents with   Plantar Warts    Pt is here to f/u on left foot due to plantar warts to the bottom of the foot, wants to have toenail clip as well.    Subjective: 74 y.o. male presenting to the office today for follow-up evaluation of symptomatic skin lesions to the plantar aspect of the left foot ongoing for several years.  He has been treated in the past at [facility].  They have been applying salicylic acid daily over the past 3-4 weeks. Also requesting nail debridement today.  History of Parkinson's disease and he is unable to trim his own nails   Past Medical History:  Diagnosis Date   Activated protein C resistance 06/28/2019   Arthritis of knee    Arthropathy of lumbar facet joint    Atrial fibrillation    BPH (benign prostatic hyperplasia) 09/10/2018   DVT (deep venous thrombosis)    L leg   Dysplastic nevus 05/15/2021   Left Superior Pectoral, mod to severe, Excised 07/16/21   Erectile dysfunction 03/01/2015   Herniated lumbar disc without myelopathy 10/11/2020   Hyperchloremia    Hypercholesterolemia    Hypertension    Inability to walk 02/22/2021   Insomnia due to medical condition 09/30/2018   Major depressive disorder 01/22/2022   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   Nephrolithiasis    Osteoarthritis    Parkinson's disease (HCC)    Plantar wart    Pneumonia    Post-operative nausea and vomiting 09/20/2020   Pulmonary embolism    Sciatica    Seasonal allergies    TIA (transient ischemic attack)     Past Surgical History:  Procedure Laterality Date   APPENDECTOMY     BOWEL DECOMPRESSION  10/15/2022   Procedure: BOWEL DECOMPRESSION;  Surgeon: Onita Elspeth Sharper, DO;  Location: [facility] ENDOSCOPY;  Service: Gastroenterology;;   CATARACT EXTRACTION Bilateral 10/2019 11/2019   COLON RESECTION SIGMOID N/A 10/17/2022   Procedure: COLON RESECTION SIGMOID;  Surgeon: Desiderio Schanz, MD;  Location:  [facility] ORS;  Service: General;  Laterality: N/A;   COLONOSCOPY WITH PROPOFOL  N/A 07/27/2019   Procedure: COLONOSCOPY WITH PROPOFOL ;  Surgeon: Unk Corinn Skiff, MD;  Location: [facility] ENDOSCOPY;  Service: Endoscopy;  Laterality: N/A;   EYE SURGERY     FLEXIBLE SIGMOIDOSCOPY N/A 10/15/2022   Procedure: FLEXIBLE SIGMOIDOSCOPY;  Surgeon: Onita Elspeth Sharper, DO;  Location: [facility] ENDOSCOPY;  Service: Gastroenterology;  Laterality: N/A;   KNEE ARTHROSCOPY WITH MENISCAL REPAIR Right 2009   KNEE SURGERY Right    LUMBAR LAMINECTOMY/DECOMPRESSION MICRODISCECTOMY Left 10/11/2020   Procedure: Left Lumbar Three-Four, Lumbar Fou-Five  Microdiscectomy;  Surgeon: Unice Pac, MD;  Location: [facility] OR;  Service: Neurosurgery;  Laterality: Left;   melanoma removal Left 07/16/2021   chest   PAIN PUMP IMPLANTATION     T&A     TONSILLECTOMY      Allergies  Allergen Reactions   Codeine Other (See Comments)    Hallucinations   Other Nausea Only    General anesthesia      Objective:  Physical Exam General: Alert and oriented x3 in no acute distress  Dermatology: Hyperkeratotic lesion(s) present on the plantar aspect of the left foot x 2. Pain on palpation with a central nucleated core noted. Skin is warm, dry and supple bilateral lower extremities. Negative for open lesions or macerations.  Vascular: Palpable pedal pulses bilaterally. No edema or erythema noted. Capillary refill within normal limits.  Neurological:  Grossly intact via light touch  Musculoskeletal Exam: Patient ambulatory.  History of Parkinson's  Assessment: 1.  Eccrine poroma multiple left forefoot  Plan of Care:  -Patient evaluated -Excisional debridement of keratoic lesion(s) using a chisel blade was performed without incident.  -Salicylic acid applied with a bandaid - Okay to hold off on the salicylic acid applications for the next few weeks to allow the skin to return to normal.  If he continues to notice some thick callus  tissue recommend reapplication of salicylic acid daily for an additional 3-4 weeks and then follow-up with me in the office -Return to the clinic 6 weeks  Thresa EMERSON Sar, DPM [facility]  Dr. Thresa EMERSON Sar, DPM    [address] [place], [place] [address]                Office [phone]  Fax [phone]     "

## 2024-06-13 ENCOUNTER — Telehealth

## 2024-06-28 ENCOUNTER — Ambulatory Visit: Admitting: Podiatry

## 2024-07-01 ENCOUNTER — Ambulatory Visit: Admitting: Podiatry

## 2024-08-29 ENCOUNTER — Encounter: Admitting: Family Medicine

## 2024-09-16 ENCOUNTER — Ambulatory Visit: Admitting: Neurology

## 2024-09-20 ENCOUNTER — Encounter

## 2024-10-03 ENCOUNTER — Ambulatory Visit: Admitting: Dermatology

## 2025-02-27 ENCOUNTER — Ambulatory Visit: Admitting: Urology
